# Patient Record
Sex: Female | Born: 1965 | Race: White | Hispanic: No | Marital: Married | Smoking: Never smoker
Health system: Southern US, Community
[De-identification: ages and names within clinical notes are randomized; demographics above are authoritative.]

## PROBLEM LIST (undated history)

## (undated) DIAGNOSIS — K635 Polyp of colon: Secondary | ICD-10-CM

## (undated) DIAGNOSIS — R61 Generalized hyperhidrosis: Secondary | ICD-10-CM

## (undated) DIAGNOSIS — E8941 Symptomatic postprocedural ovarian failure: Secondary | ICD-10-CM

## (undated) DIAGNOSIS — Z9889 Other specified postprocedural states: Secondary | ICD-10-CM

## (undated) DIAGNOSIS — K589 Irritable bowel syndrome without diarrhea: Secondary | ICD-10-CM

## (undated) DIAGNOSIS — I10 Essential (primary) hypertension: Secondary | ICD-10-CM

## (undated) DIAGNOSIS — F419 Anxiety disorder, unspecified: Secondary | ICD-10-CM

## (undated) DIAGNOSIS — A4902 Methicillin resistant Staphylococcus aureus infection, unspecified site: Secondary | ICD-10-CM

## (undated) DIAGNOSIS — K219 Gastro-esophageal reflux disease without esophagitis: Secondary | ICD-10-CM

## (undated) DIAGNOSIS — T8859XA Other complications of anesthesia, initial encounter: Secondary | ICD-10-CM

## (undated) DIAGNOSIS — R609 Edema, unspecified: Secondary | ICD-10-CM

## (undated) DIAGNOSIS — G4730 Sleep apnea, unspecified: Secondary | ICD-10-CM

## (undated) DIAGNOSIS — C439 Malignant melanoma of skin, unspecified: Secondary | ICD-10-CM

## (undated) DIAGNOSIS — T4145XA Adverse effect of unspecified anesthetic, initial encounter: Secondary | ICD-10-CM

## (undated) DIAGNOSIS — E079 Disorder of thyroid, unspecified: Secondary | ICD-10-CM

## (undated) DIAGNOSIS — R202 Paresthesia of skin: Secondary | ICD-10-CM

## (undated) DIAGNOSIS — D649 Anemia, unspecified: Secondary | ICD-10-CM

## (undated) DIAGNOSIS — I509 Heart failure, unspecified: Secondary | ICD-10-CM

## (undated) DIAGNOSIS — G709 Myoneural disorder, unspecified: Secondary | ICD-10-CM

## (undated) DIAGNOSIS — IMO0001 Reserved for inherently not codable concepts without codable children: Secondary | ICD-10-CM

## (undated) DIAGNOSIS — H269 Unspecified cataract: Secondary | ICD-10-CM

## (undated) DIAGNOSIS — E7800 Pure hypercholesterolemia, unspecified: Secondary | ICD-10-CM

## (undated) DIAGNOSIS — R112 Nausea with vomiting, unspecified: Secondary | ICD-10-CM

## (undated) DIAGNOSIS — R5383 Other fatigue: Secondary | ICD-10-CM

## (undated) DIAGNOSIS — G43909 Migraine, unspecified, not intractable, without status migrainosus: Secondary | ICD-10-CM

## (undated) DIAGNOSIS — S3210XA Unspecified fracture of sacrum, initial encounter for closed fracture: Secondary | ICD-10-CM

## (undated) DIAGNOSIS — G1221 Amyotrophic lateral sclerosis: Secondary | ICD-10-CM

## (undated) DIAGNOSIS — J349 Unspecified disorder of nose and nasal sinuses: Secondary | ICD-10-CM

## (undated) DIAGNOSIS — M1990 Unspecified osteoarthritis, unspecified site: Secondary | ICD-10-CM

## (undated) DIAGNOSIS — R946 Abnormal results of thyroid function studies: Secondary | ICD-10-CM

## (undated) DIAGNOSIS — E119 Type 2 diabetes mellitus without complications: Secondary | ICD-10-CM

## (undated) DIAGNOSIS — E538 Deficiency of other specified B group vitamins: Secondary | ICD-10-CM

## (undated) DIAGNOSIS — N63 Unspecified lump in unspecified breast: Secondary | ICD-10-CM

## (undated) DIAGNOSIS — T7840XA Allergy, unspecified, initial encounter: Secondary | ICD-10-CM

## (undated) HISTORY — DX: Morbid (severe) obesity due to excess calories: E66.01

## (undated) HISTORY — DX: Other fatigue: R53.83

## (undated) HISTORY — DX: Myoneural disorder, unspecified: G70.9

## (undated) HISTORY — DX: Unspecified disorder of nose and nasal sinuses: J34.9

## (undated) HISTORY — PX: SPINE SURGERY: SHX786

## (undated) HISTORY — DX: Methicillin resistant Staphylococcus aureus infection, unspecified site: A49.02

## (undated) HISTORY — DX: Symptomatic postprocedural ovarian failure: E89.41

## (undated) HISTORY — DX: Deficiency of other specified B group vitamins: E53.8

## (undated) HISTORY — DX: Unspecified osteoarthritis, unspecified site: M19.90

## (undated) HISTORY — DX: Abnormal results of thyroid function studies: R94.6

## (undated) HISTORY — DX: Edema, unspecified: R60.9

## (undated) HISTORY — DX: Sleep apnea, unspecified: G47.30

## (undated) HISTORY — DX: Allergy, unspecified, initial encounter: T78.40XA

## (undated) HISTORY — DX: Unspecified cataract: H26.9

## (undated) HISTORY — DX: Unspecified lump in unspecified breast: N63.0

## (undated) HISTORY — DX: Anxiety disorder, unspecified: F41.9

## (undated) HISTORY — DX: Irritable bowel syndrome, unspecified: K58.9

## (undated) HISTORY — DX: Generalized hyperhidrosis: R61

## (undated) HISTORY — DX: Malignant melanoma of skin, unspecified: C43.9

## (undated) HISTORY — DX: Anemia, unspecified: D64.9

## (undated) HISTORY — DX: Heart failure, unspecified: I50.9

## (undated) HISTORY — DX: Paresthesia of skin: R20.2

## (undated) HISTORY — DX: Reserved for inherently not codable concepts without codable children: IMO0001

## (undated) HISTORY — DX: Type 2 diabetes mellitus without complications: E11.9

## (undated) HISTORY — DX: Pure hypercholesterolemia, unspecified: E78.00

## (undated) HISTORY — PX: TUBAL LIGATION: SHX77

## (undated) HISTORY — DX: Essential (primary) hypertension: I10

## (undated) HISTORY — DX: Migraine, unspecified, not intractable, without status migrainosus: G43.909

## (undated) HISTORY — DX: Gastro-esophageal reflux disease without esophagitis: K21.9

## (undated) HISTORY — DX: Disorder of thyroid, unspecified: E07.9

## (undated) HISTORY — DX: Unspecified fracture of sacrum, initial encounter for closed fracture: S32.10XA

## (undated) HISTORY — DX: Polyp of colon: K63.5

---
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
## 1989-03-20 HISTORY — PX: CHOLECYSTECTOMY: SHX55

## 1995-03-21 HISTORY — PX: ABDOMINAL HYSTERECTOMY: SHX81

## 1995-03-21 HISTORY — PX: OTHER SURGICAL HISTORY: SHX169

## 1997-07-03 ENCOUNTER — Ambulatory Visit (HOSPITAL_COMMUNITY): Admission: RE | Admit: 1997-07-03 | Discharge: 1997-07-03 | Payer: Self-pay | Admitting: *Deleted

## 1997-09-04 ENCOUNTER — Ambulatory Visit (HOSPITAL_COMMUNITY): Admission: RE | Admit: 1997-09-04 | Discharge: 1997-09-04 | Payer: Self-pay | Admitting: *Deleted

## 1997-10-29 ENCOUNTER — Other Ambulatory Visit: Admission: RE | Admit: 1997-10-29 | Discharge: 1997-10-29 | Payer: Self-pay | Admitting: Obstetrics and Gynecology

## 1998-05-28 ENCOUNTER — Ambulatory Visit (HOSPITAL_COMMUNITY): Admission: RE | Admit: 1998-05-28 | Discharge: 1998-05-28 | Payer: Self-pay | Admitting: Gastroenterology

## 1999-11-10 ENCOUNTER — Ambulatory Visit (HOSPITAL_COMMUNITY): Admission: RE | Admit: 1999-11-10 | Discharge: 1999-11-10 | Payer: Self-pay | Admitting: Obstetrics and Gynecology

## 1999-11-10 ENCOUNTER — Encounter: Payer: Self-pay | Admitting: Obstetrics and Gynecology

## 1999-11-15 ENCOUNTER — Observation Stay (HOSPITAL_COMMUNITY): Admission: RE | Admit: 1999-11-15 | Discharge: 1999-11-16 | Payer: Self-pay | Admitting: Obstetrics and Gynecology

## 1999-11-15 ENCOUNTER — Encounter (INDEPENDENT_AMBULATORY_CARE_PROVIDER_SITE_OTHER): Payer: Self-pay

## 2000-10-30 ENCOUNTER — Other Ambulatory Visit: Admission: RE | Admit: 2000-10-30 | Discharge: 2000-10-30 | Payer: Self-pay | Admitting: Obstetrics and Gynecology

## 2001-05-30 ENCOUNTER — Ambulatory Visit (HOSPITAL_COMMUNITY): Admission: RE | Admit: 2001-05-30 | Discharge: 2001-05-30 | Payer: Self-pay | Admitting: Gastroenterology

## 2002-04-01 ENCOUNTER — Encounter: Payer: Self-pay | Admitting: Gastroenterology

## 2002-04-01 ENCOUNTER — Ambulatory Visit (HOSPITAL_COMMUNITY): Admission: RE | Admit: 2002-04-01 | Discharge: 2002-04-01 | Payer: Self-pay | Admitting: Gastroenterology

## 2002-09-16 ENCOUNTER — Ambulatory Visit (HOSPITAL_COMMUNITY): Admission: RE | Admit: 2002-09-16 | Discharge: 2002-09-16 | Payer: Self-pay | Admitting: Gastroenterology

## 2002-09-23 ENCOUNTER — Encounter: Payer: Self-pay | Admitting: Gastroenterology

## 2002-09-23 ENCOUNTER — Ambulatory Visit (HOSPITAL_COMMUNITY): Admission: RE | Admit: 2002-09-23 | Discharge: 2002-09-23 | Payer: Self-pay | Admitting: Gastroenterology

## 2002-11-20 ENCOUNTER — Ambulatory Visit (HOSPITAL_COMMUNITY): Admission: RE | Admit: 2002-11-20 | Discharge: 2002-11-20 | Payer: Self-pay | Admitting: Family Medicine

## 2002-11-20 ENCOUNTER — Encounter: Payer: Self-pay | Admitting: Family Medicine

## 2004-02-16 ENCOUNTER — Ambulatory Visit: Payer: Self-pay | Admitting: Internal Medicine

## 2004-02-19 ENCOUNTER — Other Ambulatory Visit: Admission: RE | Admit: 2004-02-19 | Discharge: 2004-02-19 | Payer: Self-pay | Admitting: Obstetrics and Gynecology

## 2004-03-20 HISTORY — PX: KNEE SURGERY: SHX244

## 2004-05-17 ENCOUNTER — Ambulatory Visit (HOSPITAL_COMMUNITY): Admission: RE | Admit: 2004-05-17 | Discharge: 2004-05-17 | Payer: Self-pay | Admitting: Chiropractic Medicine

## 2004-05-24 ENCOUNTER — Ambulatory Visit (HOSPITAL_COMMUNITY): Admission: RE | Admit: 2004-05-24 | Discharge: 2004-05-24 | Payer: Self-pay | Admitting: Chiropractic Medicine

## 2004-08-25 ENCOUNTER — Ambulatory Visit: Payer: Self-pay

## 2004-08-30 ENCOUNTER — Ambulatory Visit: Payer: Self-pay

## 2004-09-09 ENCOUNTER — Ambulatory Visit: Payer: Self-pay | Admitting: Family Medicine

## 2004-09-27 ENCOUNTER — Ambulatory Visit: Payer: Self-pay | Admitting: Family Medicine

## 2004-12-15 ENCOUNTER — Other Ambulatory Visit: Admission: RE | Admit: 2004-12-15 | Discharge: 2004-12-15 | Payer: Self-pay | Admitting: Obstetrics and Gynecology

## 2005-02-28 ENCOUNTER — Ambulatory Visit (HOSPITAL_COMMUNITY): Admission: RE | Admit: 2005-02-28 | Discharge: 2005-02-28 | Payer: Self-pay | Admitting: Gastroenterology

## 2007-06-27 ENCOUNTER — Ambulatory Visit: Payer: Self-pay | Admitting: Critical Care Medicine

## 2007-06-27 DIAGNOSIS — J309 Allergic rhinitis, unspecified: Secondary | ICD-10-CM | POA: Insufficient documentation

## 2007-06-27 DIAGNOSIS — I10 Essential (primary) hypertension: Secondary | ICD-10-CM | POA: Insufficient documentation

## 2007-06-27 DIAGNOSIS — K219 Gastro-esophageal reflux disease without esophagitis: Secondary | ICD-10-CM

## 2007-06-27 DIAGNOSIS — J45909 Unspecified asthma, uncomplicated: Secondary | ICD-10-CM | POA: Insufficient documentation

## 2007-06-30 DIAGNOSIS — E1140 Type 2 diabetes mellitus with diabetic neuropathy, unspecified: Secondary | ICD-10-CM | POA: Insufficient documentation

## 2007-06-30 DIAGNOSIS — Z794 Long term (current) use of insulin: Secondary | ICD-10-CM

## 2007-06-30 DIAGNOSIS — E785 Hyperlipidemia, unspecified: Secondary | ICD-10-CM

## 2007-06-30 DIAGNOSIS — R05 Cough: Secondary | ICD-10-CM

## 2007-06-30 DIAGNOSIS — E1165 Type 2 diabetes mellitus with hyperglycemia: Secondary | ICD-10-CM

## 2007-06-30 DIAGNOSIS — R059 Cough, unspecified: Secondary | ICD-10-CM | POA: Insufficient documentation

## 2007-07-03 ENCOUNTER — Telehealth: Payer: Self-pay | Admitting: Critical Care Medicine

## 2007-08-22 ENCOUNTER — Ambulatory Visit: Payer: Self-pay | Admitting: Critical Care Medicine

## 2008-05-05 ENCOUNTER — Ambulatory Visit: Payer: Self-pay | Admitting: Family Medicine

## 2008-12-23 ENCOUNTER — Ambulatory Visit: Payer: Self-pay | Admitting: Family Medicine

## 2009-02-01 ENCOUNTER — Ambulatory Visit: Payer: Self-pay | Admitting: Family Medicine

## 2009-02-24 ENCOUNTER — Ambulatory Visit: Payer: Self-pay | Admitting: Family Medicine

## 2009-03-25 ENCOUNTER — Encounter (INDEPENDENT_AMBULATORY_CARE_PROVIDER_SITE_OTHER): Payer: Self-pay | Admitting: Internal Medicine

## 2009-04-02 ENCOUNTER — Ambulatory Visit (HOSPITAL_COMMUNITY): Admission: RE | Admit: 2009-04-02 | Discharge: 2009-04-02 | Payer: Self-pay | Admitting: Surgery

## 2009-04-12 ENCOUNTER — Ambulatory Visit (HOSPITAL_COMMUNITY): Admission: RE | Admit: 2009-04-12 | Discharge: 2009-04-12 | Payer: Self-pay | Admitting: Surgery

## 2009-04-29 ENCOUNTER — Encounter: Admission: RE | Admit: 2009-04-29 | Discharge: 2009-07-28 | Payer: Self-pay | Admitting: Surgery

## 2009-05-11 ENCOUNTER — Ambulatory Visit: Payer: Self-pay | Admitting: Family Medicine

## 2009-05-18 HISTORY — PX: LAPAROSCOPIC GASTRIC BANDING: SHX1100

## 2009-06-08 ENCOUNTER — Ambulatory Visit (HOSPITAL_COMMUNITY)
Admission: RE | Admit: 2009-06-08 | Discharge: 2009-06-09 | Payer: Self-pay | Source: Home / Self Care | Admitting: Surgery

## 2009-09-13 ENCOUNTER — Ambulatory Visit: Payer: Self-pay | Admitting: Family Medicine

## 2009-10-04 ENCOUNTER — Encounter
Admission: RE | Admit: 2009-10-04 | Discharge: 2009-10-04 | Payer: Self-pay | Source: Home / Self Care | Admitting: Surgery

## 2010-04-21 NOTE — Letter (Signed)
Summary: Page One Only/[facility]  Page One Only/[facility]   Imported By: Lanelle Bal 04/17/2009 10:39:25  _____________________________________________________________________  External Attachment:    Type:   Image     Comment:   External Document

## 2010-05-12 ENCOUNTER — Ambulatory Visit: Payer: Self-pay | Admitting: Family Medicine

## 2010-05-24 ENCOUNTER — Ambulatory Visit: Payer: Self-pay | Admitting: Family Medicine

## 2010-06-10 LAB — DIFFERENTIAL
Basophils Absolute: 0 10*3/uL (ref 0.0–0.1)
Basophils Absolute: 0.1 10*3/uL (ref 0.0–0.1)
Basophils Relative: 0 % (ref 0–1)
Eosinophils Absolute: 0.1 10*3/uL (ref 0.0–0.7)
Eosinophils Relative: 2 % (ref 0–5)
Lymphs Abs: 2.2 10*3/uL (ref 0.7–4.0)
Lymphs Abs: 2.3 10*3/uL (ref 0.7–4.0)
Monocytes Absolute: 0.4 10*3/uL (ref 0.1–1.0)
Monocytes Relative: 7 % (ref 3–12)
Neutrophils Relative %: 50 % (ref 43–77)

## 2010-06-10 LAB — COMPREHENSIVE METABOLIC PANEL
ALT: 47 U/L — ABNORMAL HIGH (ref 0–35)
AST: 37 U/L (ref 0–37)
Albumin: 3.8 g/dL (ref 3.5–5.2)
Alkaline Phosphatase: 53 U/L (ref 39–117)
CO2: 29 mEq/L (ref 19–32)
Calcium: 9.3 mg/dL (ref 8.4–10.5)
Creatinine, Ser: 0.84 mg/dL (ref 0.4–1.2)
GFR calc non Af Amer: >60 mL/min (ref >60)
Sodium: 139 mEq/L (ref 135–145)
Total Bilirubin: 0.5 mg/dL (ref 0.3–1.2)
Total Protein: 7.1 g/dL (ref 6.0–8.3)

## 2010-06-10 LAB — CBC
HCT: 32.9 % — ABNORMAL LOW (ref 36.0–46.0)
HCT: 37.4 % (ref 36.0–46.0)
Hemoglobin: 12 g/dL (ref 12.0–15.0)
MCHC: 32.1 g/dL (ref 30.0–36.0)
RBC: 3.94 MIL/uL (ref 3.87–5.11)
RBC: 4.48 MIL/uL (ref 3.87–5.11)
WBC: 5.9 10*3/uL (ref 4.0–10.5)

## 2010-06-10 LAB — GLUCOSE, CAPILLARY
Glucose-Capillary: 110 mg/dL — ABNORMAL HIGH (ref 70–99)
Glucose-Capillary: 116 mg/dL — ABNORMAL HIGH (ref 70–99)
Glucose-Capillary: 125 mg/dL — ABNORMAL HIGH (ref 70–99)
Glucose-Capillary: 154 mg/dL — ABNORMAL HIGH (ref 70–99)
Glucose-Capillary: 97 mg/dL (ref 70–99)

## 2010-06-10 LAB — HEMOGLOBIN AND HEMATOCRIT, BLOOD
HCT: 32.2 % — ABNORMAL LOW (ref 36.0–46.0)
Hemoglobin: 10.6 g/dL — ABNORMAL LOW (ref 12.0–15.0)

## 2010-08-05 NOTE — Op Note (Signed)
[facility]  Patient:    [name], [name]                  MRN: [id number] Proc. Date: 11/15/99 Adm. Date:  [id number] Attending:  Miguel Aschoff                           Operative Report  PREOPERATIVE DIAGNOSES:  Persistent pelvic pain.  POSTOPERATIVE DIAGNOSES:  Pelvic adhesions.  PROCEDURE:  Diagnostic laparoscopy with laparoscopic bilateral salpingo-oophorectomy.  SURGEON:  Dr. Miguel Aschoff.  ASSISTANT:  Dr. Conley Simmonds.  ANESTHESIA:  General.  COMPLICATIONS:  None.  JUSTIFICATION:  The patient is a 45 year old white female who underwent hysterectomy in 1998. The patient has had persistent left lower quadrant pain unresponsive to medical therapy.  Ultrasound revealed a cyst on this ovary. This was aspirated under radiologic guidance and the pain persisted following the aspiration of the cyst. In view of these symptoms with no response to either medical treatment or basic radiologic attempts to correct the pain she presents now to undergo laparoscopy. The risks and benefits of this procedure were discussed with the patient including the possibility of laparotomy.  DESCRIPTION OF PROCEDURE:  The patient was taken to the operating room, placed in the supine position. General anesthesia was administered without difficulty. She was then placed in dorsal lithotomy position, prepped and draped in the usual sterile fashion. The bladder was catheterized, Foley catheter left in place. After this was done, a small infraumbilical incision was made. A Veress needle was inserted and then the abdomen was insufflated with 3 liters of CO2. Following the insufflation, the trocar and the laparoscope was placed followed by the laparoscope itself. This allowed complete visualization. A 5 mm midline port was established and an 11 mm left lower quadrant port was established under direct vision. The specimen revealed adhesions of the appendices epiploica of the  sigmoid colon to the left lateral pelvic side wall deep in the pelvis. In addition, the left ovary was adherent to the left pelvic side wall. The ovary other than having these adhesions appeared grossly within normal limits. The right ovary was inspected and appeared to be within normal limits. There were minimal adhesions noted on the right. Intestinal surfaces were unremarkable, the liver surface was visualized and appeared to be within normal limits. At this point, the left ovary was elevated and the adhesions holding the ovary to the pelvic side wall were taken down sharply with care to avoid injury to any adjacent structures. It was possible to free the ovary completely from these adhesions and elevate it. The ovary was only bound by the infundibulopelvic ligament. Then using 2 Vicryl 0 endoloops it was possible to ligate the infundibulopelvic ligament doubly and then cut the ovary and tube specimen free. It was then placed in the cul-de-sac. Attention was then directed to the right ovary. In view of this patients multiple complaints of pelvic pain, prior surgery and complications with methicillin resistant staph, it was elected to proceed with bilateral salpingo-oophorectomy and try to avoid any future pelvic surgery. The identical procedure was then carried out on the right side with the ovary being elevated where it was ______ by the infundibulopelvic ligament and with 2-0 Vicryl endoloops this ligament was ligated and then the ovary and tube cut free. An endocatch was then placed into the abdomen and both ovaries were placed in the endocatch bag. This with then brought out through the abdominal wall.  The left upper quadrant vascular incision was extended by approximately 3 or 4 mm and then there was a possibility to bring the specimen through the abdominal wall. At this point, the pelvis irrigated with warm saline. There appeared to be excellent hemostasis noted. At this point, the  CO2 was allowed to escape and all instruments were removed. The fascia of the left lower quadrant port was then identified and ligated using running interlocking #o Vicryl suture. The subcutaneous tissue was closed using interrupted #0 Vicryl suture. The infraumbilical port was then closed using interrupted #o Vicryl sutures and then using subcuticular 4-0 Vicryl suture. The 5 mm port was closed using subcuticular 4-0 Vicryl suture. At this point with no other abnormalities being noted, the port sites were injected with 0.25% Marcaine. The procedure was completed. The patient is going to spend the night in the hospital for observation. She should be able to be discharged home on November 16, 1999. DD:  11/15/99 TD:  11/15/99 Job: [id number]

## 2010-09-20 ENCOUNTER — Other Ambulatory Visit: Payer: Self-pay | Admitting: Family Medicine

## 2010-09-20 DIAGNOSIS — G43909 Migraine, unspecified, not intractable, without status migrainosus: Secondary | ICD-10-CM

## 2010-09-23 ENCOUNTER — Other Ambulatory Visit: Payer: Self-pay

## 2010-10-06 ENCOUNTER — Encounter (INDEPENDENT_AMBULATORY_CARE_PROVIDER_SITE_OTHER): Payer: Self-pay | Admitting: Surgery

## 2010-11-02 ENCOUNTER — Encounter (INDEPENDENT_AMBULATORY_CARE_PROVIDER_SITE_OTHER): Payer: Self-pay | Admitting: General Surgery

## 2010-11-04 ENCOUNTER — Encounter (INDEPENDENT_AMBULATORY_CARE_PROVIDER_SITE_OTHER): Payer: Self-pay | Admitting: Surgery

## 2010-11-04 ENCOUNTER — Ambulatory Visit (INDEPENDENT_AMBULATORY_CARE_PROVIDER_SITE_OTHER): Payer: BC Managed Care – PPO | Admitting: Surgery

## 2010-11-04 VITALS — BP 128/90 | Ht 67.0 in | Wt 263.0 lb

## 2010-11-04 DIAGNOSIS — Z9884 Bariatric surgery status: Secondary | ICD-10-CM

## 2010-11-04 DIAGNOSIS — Z4651 Encounter for fitting and adjustment of gastric lap band: Secondary | ICD-10-CM

## 2010-11-04 NOTE — Progress Notes (Signed)
[name] comes in today. She is 17 months postop and has lost 91 pounds. Today's weight is 263. She felt that she needed to fill. I added 0.3 cc to her band.  We will see her back at the end of December. She has some issue going on her right breast and will need to be followed up with mammography. In addition she has some questionable thyroid issues going on as well. [name] was working as a period plan return in December.

## 2010-11-16 ENCOUNTER — Encounter (INDEPENDENT_AMBULATORY_CARE_PROVIDER_SITE_OTHER): Payer: Self-pay | Admitting: General Surgery

## 2010-11-16 ENCOUNTER — Ambulatory Visit (INDEPENDENT_AMBULATORY_CARE_PROVIDER_SITE_OTHER): Payer: BC Managed Care – PPO | Admitting: General Surgery

## 2010-11-16 NOTE — Progress Notes (Signed)
Patient returns after having had a fill about 10 days ago. She has been unable to tolerate any solid food with frequent nausea and vomiting. She is having nighttime reflux and regurgitation which she was not having prior to the fill.  With this information we went ahead and removed the 0.3 cc that was added at her last visit. She will followup in approximately 3 months.

## 2010-11-16 NOTE — Patient Instructions (Signed)
Call for concerns 

## 2010-11-30 ENCOUNTER — Ambulatory Visit: Payer: Self-pay | Admitting: Family Medicine

## 2011-03-21 HISTORY — PX: COLONOSCOPY: SHX174

## 2011-03-21 LAB — HM COLONOSCOPY: HM Colonoscopy: NORMAL

## 2011-03-31 ENCOUNTER — Encounter (INDEPENDENT_AMBULATORY_CARE_PROVIDER_SITE_OTHER): Payer: Self-pay | Admitting: Surgery

## 2011-03-31 ENCOUNTER — Ambulatory Visit (INDEPENDENT_AMBULATORY_CARE_PROVIDER_SITE_OTHER): Payer: BC Managed Care – PPO | Admitting: Surgery

## 2011-03-31 VITALS — BP 132/88 | HR 72 | Temp 97.0°F | Resp 16 | Ht 67.0 in | Wt 250.2 lb

## 2011-03-31 DIAGNOSIS — Z9884 Bariatric surgery status: Secondary | ICD-10-CM

## 2011-03-31 DIAGNOSIS — Z4651 Encounter for fitting and adjustment of gastric lap band: Secondary | ICD-10-CM

## 2011-03-31 NOTE — Progress Notes (Signed)
[name] Body mass index is 39.19 kg/(m^2).  Having regurgitation:  Yes and esophageal spasm with first bite   Nocturnal reflux?  yes  Amount of fill  -0.3  [name] has been eating more suits. She reports pain with first bite of solids which I think is esophageal spasm. I think she still tied so it took out 0.3 cc per minute. Return 2 months

## 2011-03-31 NOTE — Patient Instructions (Signed)

## 2011-05-18 ENCOUNTER — Ambulatory Visit: Payer: Self-pay | Admitting: Family Medicine

## 2011-06-02 ENCOUNTER — Encounter (INDEPENDENT_AMBULATORY_CARE_PROVIDER_SITE_OTHER): Payer: BC Managed Care – PPO | Admitting: Surgery

## 2011-08-03 ENCOUNTER — Encounter (INDEPENDENT_AMBULATORY_CARE_PROVIDER_SITE_OTHER): Payer: BC Managed Care – PPO | Admitting: Surgery

## 2011-08-28 LAB — HM PAP SMEAR

## 2011-09-28 ENCOUNTER — Encounter (INDEPENDENT_AMBULATORY_CARE_PROVIDER_SITE_OTHER): Payer: Self-pay | Admitting: Surgery

## 2011-09-28 ENCOUNTER — Ambulatory Visit (INDEPENDENT_AMBULATORY_CARE_PROVIDER_SITE_OTHER): Payer: BC Managed Care – PPO | Admitting: Surgery

## 2011-09-28 VITALS — Temp 96.6°F | Ht 67.0 in | Wt 252.5 lb

## 2011-09-28 DIAGNOSIS — Z9884 Bariatric surgery status: Secondary | ICD-10-CM

## 2011-09-28 NOTE — Patient Instructions (Signed)

## 2011-09-28 NOTE — Progress Notes (Signed)
[name] Body mass index is 39.55 kg/(m^2).  Having regurgitation:  no  Nocturnal reflux?  no  Amount of fill  0.25  Needing restriction.  Weight 252.

## 2011-10-10 ENCOUNTER — Encounter (INDEPENDENT_AMBULATORY_CARE_PROVIDER_SITE_OTHER): Payer: Self-pay | Admitting: Surgery

## 2011-10-10 ENCOUNTER — Ambulatory Visit (INDEPENDENT_AMBULATORY_CARE_PROVIDER_SITE_OTHER): Payer: BC Managed Care – PPO | Admitting: Surgery

## 2011-10-10 VITALS — BP 142/92 | HR 64 | Temp 97.0°F | Resp 18 | Ht 67.0 in | Wt 251.0 lb

## 2011-10-10 DIAGNOSIS — Z9884 Bariatric surgery status: Secondary | ICD-10-CM

## 2011-10-10 NOTE — Progress Notes (Signed)
URGENT Office [facility] P Lamos 46 y.o.  Body mass index is 39.31 kg/(m^2).  Patient Active Problem List  Diagnosis  . DIABETES, TYPE 2  . HYPERLIPIDEMIA  . HYPERTENSION  . ALLERGIC RHINITIS  . ASTHMA  . GERD  . COUGH  . Lapband APL with HH repair    Allergies  Allergen Reactions  . Levofloxacin   . Meperidine Hcl   . Oxycodone-Acetaminophen   . Oxycodone-Acetaminophen   . Penicillins   . Vancomycin     Past Surgical History  Procedure Date  . Cholecystectomy 03/1989  . Abdominal hysterectomy 1997    complete in 1997, partial was in 1995  . Knee surgery 2006  . Laparoscopic gastric banding 05/2009  . Cesarean section 1987  and 1989  . Abcess removal 1997   SMITH,KRISTI, MD No diagnosis found.  Too tight with nocturnal reflux.  0.2 withdrawn from her band.  Will see at previously made appt.  Otherwise will see sooner if not better.  Matt B. Daphine Deutscher, MD, [facility] [phone] beeper [phone]  10/10/2011 3:28 PM

## 2011-10-10 NOTE — Patient Instructions (Addendum)

## 2011-10-16 ENCOUNTER — Ambulatory Visit (INDEPENDENT_AMBULATORY_CARE_PROVIDER_SITE_OTHER): Payer: BC Managed Care – PPO | Admitting: Family Medicine

## 2011-10-16 VITALS — BP 114/70 | HR 92 | Temp 98.2°F | Resp 16 | Ht 67.0 in | Wt 249.0 lb

## 2011-10-16 DIAGNOSIS — D239 Other benign neoplasm of skin, unspecified: Secondary | ICD-10-CM

## 2011-10-16 DIAGNOSIS — H6090 Unspecified otitis externa, unspecified ear: Secondary | ICD-10-CM

## 2011-10-16 DIAGNOSIS — H60399 Other infective otitis externa, unspecified ear: Secondary | ICD-10-CM

## 2011-10-16 DIAGNOSIS — D229 Melanocytic nevi, unspecified: Secondary | ICD-10-CM

## 2011-10-16 MED ORDER — NEOMYCIN-POLYMYXIN-HC 1 % OT SOLN: 4.0000 [drp] | Freq: Four times a day (QID) | OTIC | Status: DC

## 2011-10-16 NOTE — Progress Notes (Signed)
[facility] [address] [place] [address] [phone]  Date:  10/16/2011   Name:  [name]   DOB:  01/18/1966   MRN:  [id number]  PCP:  Nilda Simmer, MD    Chief Complaint: Otalgia, Sore Throat and Nevus   History of Present Illness:  [name] is a 46 y.o. very pleasant female patient who presents with the following:  She has noted left ear pain and a sore throat for a couple of days.  The right ear feels full but not painful.  She has not been swimming but does tend to get water in her ears when she showers.  The left ear hurts to put pressure on/ hurts to lie on the ear.   No fever.  She had noted a cough but this was due GERD- better since lap- band loostened up.    She also has a mold on the right side of her neck.  It has been present "forever" but has bled and scabbed for the last couple of weeks only. She is concerned about this mole and wants to be sure if it not anything of concern.  No history of skin cancer    Patient Active Problem List  Diagnosis  . DIABETES, TYPE 2  . HYPERLIPIDEMIA  . HYPERTENSION  . ALLERGIC RHINITIS  . ASTHMA  . GERD  . COUGH  . Lapband APL with HH repair    Past Medical History  Diagnosis Date  . Diabetes mellitus   . Hypertension   . Anemia   . Migraines   . Thyroid disease   . Colon polyp   . IBS (irritable bowel syndrome)   . Fatigue   . Night sweats   . Lump in female breast   . Asthma   . Sinus problem   . Reflux   . Family history of breast cancer     two aunts  . GERD (gastroesophageal reflux disease)     Past Surgical History  Procedure Date  . Cholecystectomy 03/1989  . Abdominal hysterectomy 1997    complete in 1997, partial was in 1995  . Knee surgery 2006  . Laparoscopic gastric banding 05/2009  . Cesarean section 1987  and 1989  . Abcess removal 1997    History  Substance Use Topics  . Smoking status: Never Smoker   . Smokeless tobacco: Not on file  . Alcohol  Use: Yes     rarely - once per year    Family History  Problem Relation Age of Onset  . Diabetes Mother   . Heart disease Mother   . Other Mother     muscle disease  . Diabetes Father   . Heart disease Father   . Stroke Father   . Diabetes Brother   . Heart disease Brother     Allergies  Allergen Reactions  . Levofloxacin   . Meperidine Hcl   . Oxycodone-Acetaminophen   . Oxycodone-Acetaminophen   . Penicillins   . Vancomycin   . Amoxicillin Rash  . Tequin (Gatifloxacin) Rash    Medication list has been reviewed and updated.  Current Outpatient Prescriptions on File Prior to Visit  Medication Sig Dispense Refill  . ALPRAZolam (XANAX) 0.5 MG tablet Ad lib.      [name] atorvastatin (LIPITOR) 20 MG tablet Take 20 mg by mouth daily.        [name] CALCIUM PO Take by mouth 3 (three) times daily.        [name]  Cetirizine HCl (ZYRTEC PO) Take by mouth daily.        [name] Fexofenadine HCl (ALLEGRA PO) Take by mouth.        Mallie Darting HCl (METAGLIP PO) Take by mouth 2 (two) times daily. Patient taking 1000/2.5       . hydrochlorothiazide 25 MG tablet Take 25 mg by mouth daily.        [name] losartan (COZAAR) 100 MG tablet Take 100 mg by mouth daily.        . Multiple Vitamin (MULTIVITAMIN PO) Take by mouth daily.          Review of Systems:  As per HPI- otherwise negative.   Physical Examination: Filed Vitals:   10/16/11 0750  BP: 114/70  Pulse: 92  Temp: 98.2 F (36.8 C)  Resp: 16   Filed Vitals:   10/16/11 0750  Height: 5\' 7"  (1.702 m)  Weight: 249 lb (112.946 kg)   Body mass index is 39.00 kg/(m^2). Ideal Body Weight: Weight in (lb) to have BMI = 25: 159.3   GEN: WDWN, NAD, Non-toxic, A & O x 3, obese- but has lost over 150 lbs since her lap band! HEENT: Atraumatic, Normocephalic. Neck supple. No masses, No LAD.  Bilateral TM show scarring from prior OM.  Right IAC wnl. Left IAC appears normal, but she has tenderness with manipulation of pinna and exam of ear canal.   Oropharynx wnl Ears and Nose: No external deformity. There is an irritated, flesh colored papule on her right posterior neck.   CV: RRR, No M/G/R. No JVD. No thrill. No extra heart sounds. PULM: CTA B, no wheezes, crackles, rhonchi. No retractions. No resp. distress. No accessory muscle use. EXTR: No c/c/e NEURO Normal gait.  PSYCH: Normally interactive. Conversant. Not depressed or anxious appearing.  Calm demeanor.   VC obtained.  Prepped neck lesion with betadine and alcohol, anesthesia with 1ml of 1%lidocaine with epi.  Sterile technique employed to obtain a 4mm punch biopsy.  Placed one HM suture to close wound with ethilon suture, 4.0.  Specimen to path, wound dressed with band- aid  Assessment and Plan: 1. Mole of skin  Dermatology pathology  2. Otitis externa  NEOMYCIN-POLYMYXIN-HC, OTIC, (CORTISPORIN) 1 % SOLN   Suspect OE.  Cortisporin drops, let me know if not better in 2 days- Sooner if worse.   Mole on neck: suspect is benign, but will send to pathology to be sure.  SR in about 7 days.    Abbe Amsterdam, MD

## 2011-10-19 ENCOUNTER — Encounter: Payer: Self-pay | Admitting: Family Medicine

## 2011-10-22 ENCOUNTER — Ambulatory Visit (INDEPENDENT_AMBULATORY_CARE_PROVIDER_SITE_OTHER): Payer: BC Managed Care – PPO | Admitting: Physician Assistant

## 2011-10-22 VITALS — BP 130/80 | HR 76 | Temp 98.0°F | Resp 16

## 2011-10-22 DIAGNOSIS — Z4802 Encounter for removal of sutures: Secondary | ICD-10-CM

## 2011-10-22 DIAGNOSIS — S1190XA Unspecified open wound of unspecified part of neck, initial encounter: Secondary | ICD-10-CM

## 2011-10-22 NOTE — Progress Notes (Signed)
  Subjective:    Patient ID: [name], female    DOB: 1966-01-29, 46 y.o.   MRN: [id number]  HPI  Pt presents to clinic for suture removal.  The area is irritated and she is ready for the stitches to be removed.  Review of Systems     Objective:   Physical Exam  Well healed area - #1 stitch removed without difficulty.      Assessment & Plan:  Wound neck - wound care d/w pt.

## 2011-11-01 ENCOUNTER — Encounter (INDEPENDENT_AMBULATORY_CARE_PROVIDER_SITE_OTHER): Payer: Self-pay | Admitting: Surgery

## 2011-11-01 ENCOUNTER — Ambulatory Visit (INDEPENDENT_AMBULATORY_CARE_PROVIDER_SITE_OTHER): Payer: BC Managed Care – PPO | Admitting: Surgery

## 2011-11-01 VITALS — BP 110/78 | HR 60 | Temp 97.0°F | Resp 12 | Ht 67.0 in | Wt 244.0 lb

## 2011-11-01 DIAGNOSIS — R1310 Dysphagia, unspecified: Secondary | ICD-10-CM

## 2011-11-01 NOTE — Progress Notes (Signed)
[name] Body mass index is 38.22 kg/(m^2).  Having regurgitation:  Yes for solids  Nocturnal reflux?  no  Amount of fill  -0.3   Removed more.  If still dysphagia will get UGI

## 2011-11-01 NOTE — Patient Instructions (Addendum)

## 2011-12-04 ENCOUNTER — Ambulatory Visit: Payer: Self-pay | Admitting: Family Medicine

## 2011-12-11 ENCOUNTER — Ambulatory Visit: Payer: BC Managed Care – PPO | Admitting: Family Medicine

## 2011-12-18 ENCOUNTER — Ambulatory Visit (INDEPENDENT_AMBULATORY_CARE_PROVIDER_SITE_OTHER): Payer: BC Managed Care – PPO | Admitting: Family Medicine

## 2011-12-18 ENCOUNTER — Encounter: Payer: Self-pay | Admitting: Family Medicine

## 2011-12-18 ENCOUNTER — Ambulatory Visit: Payer: BC Managed Care – PPO

## 2011-12-18 VITALS — BP 138/90 | HR 78 | Temp 98.0°F | Resp 16 | Ht 65.75 in | Wt 245.8 lb

## 2011-12-18 DIAGNOSIS — J209 Acute bronchitis, unspecified: Secondary | ICD-10-CM

## 2011-12-18 DIAGNOSIS — M25519 Pain in unspecified shoulder: Secondary | ICD-10-CM

## 2011-12-18 DIAGNOSIS — J309 Allergic rhinitis, unspecified: Secondary | ICD-10-CM

## 2011-12-18 DIAGNOSIS — E7800 Pure hypercholesterolemia, unspecified: Secondary | ICD-10-CM

## 2011-12-18 DIAGNOSIS — M25512 Pain in left shoulder: Secondary | ICD-10-CM

## 2011-12-18 DIAGNOSIS — J45909 Unspecified asthma, uncomplicated: Secondary | ICD-10-CM

## 2011-12-18 DIAGNOSIS — Z23 Encounter for immunization: Secondary | ICD-10-CM

## 2011-12-18 DIAGNOSIS — F419 Anxiety disorder, unspecified: Secondary | ICD-10-CM

## 2011-12-18 DIAGNOSIS — E119 Type 2 diabetes mellitus without complications: Secondary | ICD-10-CM

## 2011-12-18 DIAGNOSIS — I10 Essential (primary) hypertension: Secondary | ICD-10-CM

## 2011-12-18 MED ORDER — ALPRAZOLAM 0.5 MG PO TABS: 0.5000 mg | ORAL_TABLET | Freq: Every day | ORAL | Status: DC

## 2011-12-18 MED ORDER — CLARITHROMYCIN 500 MG PO TABS: 500.0000 mg | ORAL_TABLET | Freq: Two times a day (BID) | ORAL | Status: DC

## 2011-12-18 NOTE — Progress Notes (Signed)
[address]   [place], [place]  [address]   [phone]  Subjective:    Patient ID: [name], female    DOB: 05/26/1965, 46 y.o.   MRN: [id number]  HPI This 46 y.o. female presents to establish care and for evaluation of the following:  1.  Cold: onset of illness three weeks ago.  Daughter on her back; baby has been sick.  Got sick from daughter, granddaughter.  No fever/chills/sweats.  +nasal congestion; +rhinorrhea; +productive cough; +voice changes; +PND; +ear pain; +intermittent ST.  No SOB; some wheezing for past few days.  No v/d.  Taking OTC Dayquil, Nyquil, inhalers, Robitussin cough syrup.  Now mostly in chest.  No recent antibiotics.  Taking all allergy medication -Flonase, nasal saline.    2.  DMII:  Three month follow-up.  No changes to management made at last visit.  Compliance with medication; good tolerance of medication; good symptom control.  Not checking sugars.  3. HTN; three month follow-up.  No changes to management made at last visit.  Reports good compliance with treatment, good tolerance to treatment; good symptom control.  Denies CP/palp/sob/leg swelling.   4.  Hyperlipidemia: non-fasting today. Three month follow-up; no changes to management made at last visit.  Poor compliance with treatment; good tolerance to treatment; good symptom control.  5.  S/p flu vaccine in office today.  6.  L shoulder pain:  Onset of several months ago.  No injury.  Mainly hurts at nighttime; has progressed to intermittent pain during the day. Taking Advil for pain.  Feels knot in posterior aspect of shoulder.  No neck pain but chronic neck stiffness.  +weaknesss in arm.  Lifting baby makes worse.  Laying no shoulder makes pain worse.  Taking Advil daily at night.    7.  Obesity: continues to lose weight since last visit.  8. Anxiety: worsening due to sick daughter; patient has been caring for granddaughter; daughter's husband currently deployed.  Daughter and grandchild have  been sick.     Review of Systems  Constitutional: Negative for fever, chills, diaphoresis and fatigue.  HENT: Positive for ear pain, congestion, rhinorrhea, sneezing, postnasal drip and sinus pressure. Negative for sore throat and mouth sores.   Respiratory: Positive for cough and wheezing. Negative for shortness of breath.   Gastrointestinal: Negative for nausea, vomiting, abdominal pain, diarrhea and blood in stool.  Musculoskeletal: Positive for myalgias and arthralgias. Negative for joint swelling.  Skin: Negative for pallor and rash.  Neurological: Negative for speech difficulty, weakness and numbness.    Past Medical History  Diagnosis Date  . Diabetes mellitus   . Hypertension   . Anemia   . Migraines   . Thyroid disease   . Colon polyp   . IBS (irritable bowel syndrome)   . Fatigue   . Night sweats   . Lump in female breast   . Asthma   . Sinus problem   . Reflux   . Family history of breast cancer     two aunts  . GERD (gastroesophageal reflux disease)     Past Surgical History  Procedure Date  . Cholecystectomy 03/1989  . Abdominal hysterectomy 1997    complete in 1997, partial was in 1995  . Knee surgery 2006  . Laparoscopic gastric banding 05/2009  . Cesarean section 1987  and 1989  . Abcess removal 1997    Prior to Admission medications   Medication Sig Start Date End Date Taking? Authorizing Provider  ALPRAZolam (  XANAX) 0.5 MG tablet Take 1 tablet (0.5 mg total) by mouth daily. 12/18/11  Yes Ethelda Chick, MD  atorvastatin (LIPITOR) 20 MG tablet Take 20 mg by mouth daily.     Yes Historical Provider, MD  CALCIUM PO Take by mouth 3 (three) times daily.     Yes Historical Provider, MD  Cetirizine HCl (ZYRTEC PO) Take by mouth daily.     Yes Historical Provider, MD  Fexofenadine HCl (ALLEGRA PO) Take by mouth.     Yes Historical Provider, MD  GlipiZIDE-MetFORMIN HCl (METAGLIP PO) Take by mouth 2 (two) times daily. Patient taking 1000/2.5    Yes Historical  Provider, MD  hydrochlorothiazide 25 MG tablet Take 25 mg by mouth daily.     Yes Historical Provider, MD  losartan (COZAAR) 100 MG tablet Take 100 mg by mouth daily.     Yes Historical Provider, MD  Multiple Vitamin (MULTIVITAMIN PO) Take by mouth daily.     Yes Historical Provider, MD  clarithromycin (BIAXIN) 500 MG tablet Take 1 tablet (500 mg total) by mouth 2 (two) times daily. 12/18/11   Ethelda Chick, MD  NEOMYCIN-POLYMYXIN-HC, OTIC, (CORTISPORIN) 1 % SOLN Place 4 drops into the left ear every 6 (six) hours. 10/16/11   Pearline Cables, MD    Allergies  Allergen Reactions  . Levofloxacin   . Meperidine Hcl   . Oxycodone-Acetaminophen   . Oxycodone-Acetaminophen   . Penicillins   . Vancomycin   . Amoxicillin Rash  . Tequin (Gatifloxacin) Rash    History   Social History  . Marital Status: Married    Spouse Name: N/A    Number of Children: N/A  . Years of Education: N/A   Occupational History  . Not on file.   Social History Main Topics  . Smoking status: Never Smoker   . Smokeless tobacco: Not on file  . Alcohol Use: Yes     rarely - once per year  . Drug Use: No  . Sexually Active:    Other Topics Concern  . Not on file   Social History Narrative  . No narrative on file    Family History  Problem Relation Age of Onset  . Diabetes Mother   . Heart disease Mother   . Other Mother     muscle disease  . Diabetes Father   . Heart disease Father   . Stroke Father   . Diabetes Brother   . Heart disease Brother         Objective:   Physical Exam  Nursing note and vitals reviewed. Constitutional: She is oriented to person, place, and time. She appears well-developed and well-nourished. No distress.  HENT:  Head: Normocephalic and atraumatic.  Right Ear: External ear normal.  Left Ear: External ear normal.  Nose: Nose normal.  Mouth/Throat: Oropharynx is clear and moist.  Eyes: Conjunctivae normal and EOM are normal. Pupils are equal, round, and  reactive to light.  Neck: Normal range of motion. Neck supple. No thyromegaly present.  Cardiovascular: Normal rate, regular rhythm, normal heart sounds and intact distal pulses.  Exam reveals no gallop and no friction rub.   No murmur heard. Pulmonary/Chest: Effort normal and breath sounds normal. No respiratory distress. She has no wheezes. She has no rales. She exhibits no tenderness.  Musculoskeletal:       Left shoulder: She exhibits tenderness and pain. She exhibits normal range of motion, no bony tenderness, no swelling, no effusion, no crepitus, no deformity, no  spasm, normal pulse and normal strength.       +TTP posterior L shoulder; full ROM shoulder; empty can sign negative; cross over negative.  Motor 5/5.  Lymphadenopathy:    She has no cervical adenopathy.  Neurological: She is alert and oriented to person, place, and time. No cranial nerve deficit. She exhibits normal muscle tone.  Skin: Skin is warm and dry. No rash noted. She is not diaphoretic.  Psychiatric: She has a normal mood and affect. Her behavior is normal. Judgment and thought content normal.     INFLUENZA VACCINE ADMINISTERED.    [facility] reading (PRIMARY) by  [name].  L shoulder: NAD   Assessment & Plan:   1. Type II or unspecified type diabetes mellitus without mention of complication, not stated as uncontrolled  Hemoglobin A1c  2. Pure hypercholesterolemia  Lipid panel  3. Unspecified hypothyroidism    4. Essential hypertension, benign  CBC with Differential, CK, Comprehensive metabolic panel  5. Need for prophylactic vaccination and inoculation against influenza  Flu vaccine greater than or equal to 3yo preservative free IM  6. Acute bronchitis  clarithromycin (BIAXIN) 500 MG tablet  7. Anxiety  ALPRAZolam (XANAX) 0.5 MG tablet  8. Shoulder pain, left  DG Shoulder Left     1.  DMII: moderately controlled; no change in medications; obtain labs. 2.  Hyperlipidemia: uncontrolled due to poor compliance  with medication; obtain labs.  No change to management at this time. 3. Hypertension: moderately controlled but acutely ill; no changes to management today; obtain labs. 4.  Acute bronchitis: New.  Rx for Biaxin provided; to hold statin while taking medication; continue allergy medications. 5.  L shoulder pain/strain: New. Rx for Naproxen bid provided; home exercises provided to perform daily.  Avoid repetitive lifting with arm.  6.  Anxiety: persistent; refill of Xanax provided. 7. S/p influenza vaccine.

## 2011-12-18 NOTE — Patient Instructions (Signed)
1. Type II or unspecified type diabetes mellitus without mention of complication, not stated as uncontrolled  CBC with Differential, Comprehensive metabolic panel, POCT glycosylated hemoglobin (Hb A1C)  2. Pure hypercholesterolemia  CBC with Differential, Comprehensive metabolic panel, Lipid panel  3. Unspecified hypothyroidism  CBC with Differential, Comprehensive metabolic panel, Lipid panel  4. Essential hypertension, benign  CBC with Differential, Comprehensive metabolic panel, Lipid panel  5. Need for prophylactic vaccination and inoculation against influenza  Flu vaccine greater than or equal to 3yo preservative free IM  6. Acute bronchitis  clarithromycin (BIAXIN) 500 MG tablet  7. Anxiety  ALPRAZolam (XANAX) 0.5 MG tablet   Shoulder Exercises EXERCISES  RANGE OF MOTION (ROM) AND STRETCHING EXERCISES These exercises may help you when beginning to rehabilitate your injury. Your symptoms may resolve with or without further involvement from your physician, physical therapist or athletic trainer. While completing these exercises, remember:   Restoring tissue flexibility helps normal motion to return to the joints. This allows healthier, less painful movement and activity.   An effective stretch should be held for at least 30 seconds.   A stretch should never be painful. You should only feel a gentle lengthening or release in the stretched tissue.  ROM - Pendulum  Bend at the waist so that your right / left arm falls away from your body. Support yourself with your opposite hand on a solid surface, such as a table or a countertop.   Your right / left arm should be perpendicular to the ground. If it is not perpendicular, you need to lean over farther. Relax the muscles in your right / left arm and shoulder as much as possible.   Gently sway your hips and trunk so they move your right / left arm without any use of your right / left shoulder muscles.   Progress your movements so that your  right / left arm moves side to side, then forward and backward, and finally, both clockwise and counterclockwise.   Complete __________ repetitions in each direction. Many people use this exercise to relieve discomfort in their shoulder as well as to gain range of motion.  Repeat __________ times. Complete this exercise __________ times per day. STRETCH - Flexion, Standing  Stand with good posture. With an underhand grip on your right / left hand and an overhand grip on the opposite hand, grasp a broomstick or cane so that your hands are a little more than shoulder-width apart.   Keeping your right / left elbow straight and shoulder muscles relaxed, push the stick with your opposite hand to raise your right / left arm in front of your body and then overhead. Raise your arm until you feel a stretch in your right / left shoulder, but before you have increased shoulder pain.   Try to avoid shrugging your right / left shoulder as your arm rises by keeping your shoulder blade tucked down and toward your mid-back spine. Hold __________ seconds.   Slowly return to the starting position.  Repeat __________ times. Complete this exercise __________ times per day. STRETCH - Internal Rotation  Place your right / left hand behind your back, palm-up.   Throw a towel or belt over your opposite shoulder. Grasp the towel/belt with your right / left hand.   While keeping an upright posture, gently pull up on the towel/belt until you feel a stretch in the front of your right / left shoulder.   Avoid shrugging your right / left shoulder as your arm  rises by keeping your shoulder blade tucked down and toward your mid-back spine.   Hold __________. Release the stretch by lowering your opposite hand.  Repeat __________ times. Complete this exercise __________ times per day. STRETCH - External Rotation and Abduction  Stagger your stance through a doorframe. It does not matter which foot is forward.   As  instructed by your physician, physical therapist or athletic trainer, place your hands:   And forearms above your head and on the door frame.   And forearms at head-height and on the door frame.   At elbow-height and on the door frame.   Keeping your head and chest upright and your stomach muscles tight to prevent over-extending your low-back, slowly shift your weight onto your front foot until you feel a stretch across your chest and/or in the front of your shoulders.   Hold __________ seconds. Shift your weight to your back foot to release the stretch.  Repeat __________ times. Complete this stretch __________ times per day.  STRENGTHENING EXERCISES  These exercises may help you when beginning to rehabilitate your injury. They may resolve your symptoms with or without further involvement from your physician, physical therapist or athletic trainer. While completing these exercises, remember:   Muscles can gain both the endurance and the strength needed for everyday activities through controlled exercises.   Complete these exercises as instructed by your physician, physical therapist or athletic trainer. Progress the resistance and repetitions only as guided.   You may experience muscle soreness or fatigue, but the pain or discomfort you are trying to eliminate should never worsen during these exercises. If this pain does worsen, stop and make certain you are following the directions exactly. If the pain is still present after adjustments, discontinue the exercise until you can discuss the trouble with your clinician.   If advised by your physician, during your recovery, avoid activity or exercises which involve actions that place your right / left hand or elbow above your head or behind your back or head. These positions stress the tissues which are trying to heal.  STRENGTH - Scapular Depression and Adduction  With good posture, sit on a firm chair. Supported your arms in front of you with  pillows, arm rests or a table top. Have your elbows in line with the sides of your body.   Gently draw your shoulder blades down and toward your mid-back spine. Gradually increase the tension without tensing the muscles along the top of your shoulders and the back of your neck.   Hold for __________ seconds. Slowly release the tension and relax your muscles completely before completing the next repetition.   After you have practiced this exercise, remove the arm support and complete it in standing as well as sitting.  Repeat __________ times. Complete this exercise __________ times per day.  STRENGTH - External Rotators  Secure a rubber exercise band/tubing to a fixed object so that it is at the same height as your right / left elbow when you are standing or sitting on a firm surface.   Stand or sit so that the secured exercise band/tubing is at your side that is not injured.   Bend your elbow 90 degrees. Place a folded towel or small pillow under your right / left arm so that your elbow is a few inches away from your side.   Keeping the tension on the exercise band/tubing, pull it away from your body, as if pivoting on your elbow. Be sure to keep  your body steady so that the movement is only coming from your shoulder rotating.   Hold __________ seconds. Release the tension in a controlled manner as you return to the starting position.  Repeat __________ times. Complete this exercise __________ times per day.  STRENGTH - Supraspinatus  Stand or sit with good posture. Grasp a __________ weight or an exercise band/tubing so that your hand is "thumbs-up," like when you shake hands.   Slowly lift your right / left hand from your thigh into the air, traveling about 30 degrees from straight out at your side. Lift your hand to shoulder height or as far as you can without increasing any shoulder pain. Initially, many people do not lift their hands above shoulder height.   Avoid shrugging your right  / left shoulder as your arm rises by keeping your shoulder blade tucked down and toward your mid-back spine.   Hold for __________ seconds. Control the descent of your hand as you slowly return to your starting position.  Repeat __________ times. Complete this exercise __________ times per day.  STRENGTH - Shoulder Extensors  Secure a rubber exercise band/tubing so that it is at the height of your shoulders when you are either standing or sitting on a firm arm-less chair.   With a thumbs-up grip, grasp an end of the band/tubing in each hand. Straighten your elbows and lift your hands straight in front of you at shoulder height. Step back away from the secured end of band/tubing until it becomes tense.   Squeezing your shoulder blades together, pull your hands down to the sides of your thighs. Do not allow your hands to go behind you.   Hold for __________ seconds. Slowly ease the tension on the band/tubing as you reverse the directions and return to the starting position.  Repeat __________ times. Complete this exercise __________ times per day.  STRENGTH - Scapular Retractors  Secure a rubber exercise band/tubing so that it is at the height of your shoulders when you are either standing or sitting on a firm arm-less chair.   With a palm-down grip, grasp an end of the band/tubing in each hand. Straighten your elbows and lift your hands straight in front of you at shoulder height. Step back away from the secured end of band/tubing until it becomes tense.   Squeezing your shoulder blades together, draw your elbows back as you bend them. Keep your upper arm lifted away from your body throughout the exercise.   Hold __________ seconds. Slowly ease the tension on the band/tubing as you reverse the directions and return to the starting position.  Repeat __________ times. Complete this exercise __________ times per day. STRENGTH - Scapular Depressors  Find a sturdy chair without wheels, such as a  from a dining room table.   Keeping your feet on the floor, lift your bottom from the seat and lock your elbows.   Keeping your elbows straight, allow gravity to pull your body weight down. Your shoulders will rise toward your ears.   Raise your body against gravity by drawing your shoulder blades down your back, shortening the distance between your shoulders and ears. Although your feet should always maintain contact with the floor, your feet should progressively support less body weight as you get stronger.   Hold __________ seconds. In a controlled and slow manner, lower your body weight to begin the next repetition.  Repeat __________ times. Complete this exercise __________ times per day.  Document Released: 01/18/2005 Document Revised: 02/23/2011  Document Reviewed: 06/18/2008 [facility] Patient Information 2012 [place], [place].

## 2011-12-19 ENCOUNTER — Other Ambulatory Visit (INDEPENDENT_AMBULATORY_CARE_PROVIDER_SITE_OTHER): Payer: BC Managed Care – PPO | Admitting: Family Medicine

## 2011-12-19 DIAGNOSIS — I10 Essential (primary) hypertension: Secondary | ICD-10-CM

## 2011-12-19 DIAGNOSIS — E7800 Pure hypercholesterolemia, unspecified: Secondary | ICD-10-CM

## 2011-12-19 DIAGNOSIS — E119 Type 2 diabetes mellitus without complications: Secondary | ICD-10-CM

## 2011-12-19 LAB — CBC WITH DIFFERENTIAL/PLATELET
Eosinophils Relative: 2 % (ref 0–5)
HCT: 46.2 % — ABNORMAL HIGH (ref 36.0–46.0)
Hemoglobin: 15.3 g/dL — ABNORMAL HIGH (ref 12.0–15.0)
Lymphocytes Relative: 39 % (ref 12–46)
Lymphs Abs: 3.1 10*3/uL (ref 0.7–4.0)
MCV: 85.9 fL (ref 78.0–100.0)
Monocytes Absolute: 0.6 10*3/uL (ref 0.1–1.0)
Monocytes Relative: 7 % (ref 3–12)
Neutro Abs: 4 10*3/uL (ref 1.7–7.7)
RDW: 13.1 % (ref 11.5–15.5)
WBC: 7.8 10*3/uL (ref 4.0–10.5)

## 2011-12-19 LAB — COMPREHENSIVE METABOLIC PANEL
Albumin: 4.3 g/dL (ref 3.5–5.2)
BUN: 14 mg/dL (ref 6–23)
CO2: 27 mEq/L (ref 19–32)
Calcium: 10 mg/dL (ref 8.4–10.5)
Chloride: 98 mEq/L (ref 96–112)
Glucose, Bld: 184 mg/dL — ABNORMAL HIGH (ref 70–99)
Potassium: 4.4 mEq/L (ref 3.5–5.3)
Sodium: 137 mEq/L (ref 135–145)
Total Protein: 7 g/dL (ref 6.0–8.3)

## 2011-12-19 LAB — LIPID PANEL
HDL: 51 mg/dL (ref >39)
Triglycerides: 488 mg/dL — ABNORMAL HIGH (ref <150)

## 2011-12-19 NOTE — Progress Notes (Signed)
Pt here for labs only. 

## 2011-12-20 ENCOUNTER — Encounter (INDEPENDENT_AMBULATORY_CARE_PROVIDER_SITE_OTHER): Payer: BC Managed Care – PPO | Admitting: Surgery

## 2011-12-20 DIAGNOSIS — J45909 Unspecified asthma, uncomplicated: Secondary | ICD-10-CM | POA: Insufficient documentation

## 2011-12-20 DIAGNOSIS — I10 Essential (primary) hypertension: Secondary | ICD-10-CM | POA: Insufficient documentation

## 2011-12-20 DIAGNOSIS — J309 Allergic rhinitis, unspecified: Secondary | ICD-10-CM | POA: Insufficient documentation

## 2011-12-20 DIAGNOSIS — E7800 Pure hypercholesterolemia, unspecified: Secondary | ICD-10-CM | POA: Insufficient documentation

## 2011-12-20 DIAGNOSIS — F419 Anxiety disorder, unspecified: Secondary | ICD-10-CM | POA: Insufficient documentation

## 2011-12-22 NOTE — Progress Notes (Signed)
Reviewed and agree.

## 2011-12-27 ENCOUNTER — Encounter: Payer: Self-pay | Admitting: Radiology

## 2012-02-07 ENCOUNTER — Ambulatory Visit (INDEPENDENT_AMBULATORY_CARE_PROVIDER_SITE_OTHER): Payer: BC Managed Care – PPO | Admitting: Physician Assistant

## 2012-02-07 VITALS — BP 115/80 | HR 91 | Temp 98.2°F | Resp 18 | Ht 66.0 in | Wt 248.0 lb

## 2012-02-07 DIAGNOSIS — R319 Hematuria, unspecified: Secondary | ICD-10-CM

## 2012-02-07 DIAGNOSIS — R350 Frequency of micturition: Secondary | ICD-10-CM

## 2012-02-07 DIAGNOSIS — R300 Dysuria: Secondary | ICD-10-CM

## 2012-02-07 DIAGNOSIS — N390 Urinary tract infection, site not specified: Secondary | ICD-10-CM

## 2012-02-07 LAB — POCT URINALYSIS DIPSTICK
Glucose, UA: 500
Ketones, UA: 15
Nitrite, UA: POSITIVE
Protein, UA: 300
Spec Grav, UA: 1.02
Urobilinogen, UA: 2
pH, UA: 5

## 2012-02-07 LAB — POCT UA - MICROSCOPIC ONLY
Casts, Ur, LPF, POC: NEGATIVE
Crystals, Ur, HPF, POC: NEGATIVE
Yeast, UA: NEGATIVE

## 2012-02-07 MED ORDER — NITROFURANTOIN MONOHYD MACRO 100 MG PO CAPS: 100.0000 mg | ORAL_CAPSULE | Freq: Two times a day (BID) | ORAL | Status: DC

## 2012-02-07 MED ORDER — PHENAZOPYRIDINE HCL 200 MG PO TABS: 200.0000 mg | ORAL_TABLET | Freq: Three times a day (TID) | ORAL | Status: DC | PRN

## 2012-02-07 NOTE — Progress Notes (Signed)
  Subjective:    Patient ID: [name], female    DOB: 1966-02-09, 46 y.o.   MRN: [id number]  HPI 46 year old female presents with acute onset of dysuria, suprapubic tenderness, hematuria, and frequency.  Denies flank pain, vomiting, fever, chills, or syncope.  Does admit to some nausea this morning. States it has been a few years since she has had a urinary tract infection, but when she gets them they do usually "get bad."  Admits to taking AZO this a.m which helped slightly with the pain.     Review of Systems  Constitutional: Negative for fever and chills.  Gastrointestinal: Positive for nausea and abdominal pain (suprapubic). Negative for vomiting.  Genitourinary: Positive for dysuria, urgency, frequency and hematuria. Negative for flank pain and vaginal discharge.  All other systems reviewed and are negative.       Objective:   Physical Exam  Constitutional: She is oriented to person, place, and time. She appears well-developed and well-nourished.  HENT:  Head: Normocephalic and atraumatic.  Right Ear: External ear normal.  Left Ear: External ear normal.  Mouth/Throat: No oropharyngeal exudate.  Eyes: Conjunctivae normal are normal.  Neck: Normal range of motion.  Cardiovascular: Normal rate, regular rhythm and normal heart sounds.   Pulmonary/Chest: Effort normal and breath sounds normal.  Abdominal: Soft. Bowel sounds are normal. There is tenderness (suprapubic).  Neurological: She is alert and oriented to person, place, and time.  Psychiatric: She has a normal mood and affect. Her behavior is normal. Judgment and thought content normal.     Results for orders placed in visit on 02/07/12  POCT UA - MICROSCOPIC ONLY      Component Value Range   WBC, Ur, HPF, POC 25-30     RBC, urine, microscopic TNTC     Bacteria, U Microscopic 1+     Mucus, UA small     Epithelial cells, urine per micros 2-5     Crystals, Ur, HPF, POC neg     Casts, Ur, LPF, POC neg     Yeast, UA  neg    POCT URINALYSIS DIPSTICK      Component Value Range   Color, UA dark orange     Clarity, UA cloudy     Glucose, UA 500     Bilirubin, UA small     Ketones, UA 15     Spec Grav, UA 1.020     Blood, UA large     pH, UA 5.0     Protein, UA 300     Urobilinogen, UA 2.0     Nitrite, UA positive     Leukocytes, UA large (3+)           Assessment & Plan:   1. UTI (lower urinary tract infection)  POCT UA - Microscopic Only, POCT urinalysis dipstick, nitrofurantoin, macrocrystal-monohydrate, (MACROBID) 100 MG capsule, Urine culture  2. Dysuria  phenazopyridine (PYRIDIUM) 200 MG tablet   Due to allergies, will treat with Macrobid 100 mg bid x 7 days RTC in 24-48 hours if symptoms fail to improve Pyridium prn pain Urine culture sent

## 2012-02-08 ENCOUNTER — Encounter (INDEPENDENT_AMBULATORY_CARE_PROVIDER_SITE_OTHER): Payer: BC Managed Care – PPO | Admitting: Surgery

## 2012-02-10 LAB — URINE CULTURE: Colony Count: 80000

## 2012-02-12 ENCOUNTER — Other Ambulatory Visit: Payer: Self-pay | Admitting: Physician Assistant

## 2012-02-12 ENCOUNTER — Encounter: Payer: Self-pay | Admitting: Family Medicine

## 2012-02-12 MED ORDER — SULFAMETHOXAZOLE-TRIMETHOPRIM 800-160 MG PO TABS: 1.0000 | ORAL_TABLET | Freq: Two times a day (BID) | ORAL | Status: DC

## 2012-02-13 ENCOUNTER — Telehealth: Payer: Self-pay | Admitting: Family Medicine

## 2012-02-13 MED ORDER — FLUCONAZOLE 150 MG PO TABS: 150.0000 mg | ORAL_TABLET | Freq: Once | ORAL | Status: DC

## 2012-02-13 NOTE — Telephone Encounter (Signed)
Spoke with pt over the phone.

## 2012-02-13 NOTE — Telephone Encounter (Signed)
Message reviewed and then chart further reviewed.  Diflucan sent into pharmacy.  Pt also contacted regarding Urine culture and need to start different antibiotic.  Bactrim sent to pharmacy.  No further action warranted at this time.  KMS

## 2012-02-13 NOTE — Telephone Encounter (Signed)
Patient sent message via husband.  Saw Heather, PA-C on 02/05/12 for severe UTI.  Started Macrobid and Phenazopyridine.  Have completed all but one pill and am better considering where I was as far as pain and blood in urine but over the last 24 hours have started with lower back pain and some of it is sharp in the lower right area of back and nausea has increased.  Took a phenazopyridine last night and that helped ease the back pain.  Still a little painful to urinate.  Not sure the Macrobid has totally wiped out the UTI for the amount of infection that Herbert Seta said I had.  She questioned whether it would do what was needed but decided to give it a try.  Also need some Diflucan for yeast infection that has accompanied the Macrobid and possibly a refill if changing medication.

## 2012-02-21 ENCOUNTER — Encounter: Payer: Self-pay | Admitting: *Deleted

## 2012-02-28 ENCOUNTER — Encounter: Payer: Self-pay | Admitting: Family Medicine

## 2012-02-29 ENCOUNTER — Ambulatory Visit (INDEPENDENT_AMBULATORY_CARE_PROVIDER_SITE_OTHER): Payer: BC Managed Care – PPO | Admitting: Family Medicine

## 2012-02-29 ENCOUNTER — Ambulatory Visit: Payer: BC Managed Care – PPO

## 2012-02-29 VITALS — BP 139/83 | HR 101 | Temp 99.0°F | Resp 18 | Ht 66.5 in | Wt 251.0 lb

## 2012-02-29 DIAGNOSIS — J45909 Unspecified asthma, uncomplicated: Secondary | ICD-10-CM

## 2012-02-29 DIAGNOSIS — R110 Nausea: Secondary | ICD-10-CM

## 2012-02-29 DIAGNOSIS — J40 Bronchitis, not specified as acute or chronic: Secondary | ICD-10-CM

## 2012-02-29 DIAGNOSIS — J189 Pneumonia, unspecified organism: Secondary | ICD-10-CM

## 2012-02-29 DIAGNOSIS — R05 Cough: Secondary | ICD-10-CM

## 2012-02-29 DIAGNOSIS — E119 Type 2 diabetes mellitus without complications: Secondary | ICD-10-CM

## 2012-02-29 LAB — POCT CBC
Lymph, poc: 2.1 (ref 0.6–3.4)
MCH, POC: 27.5 pg (ref 27–31.2)
MCV: 89.5 fL (ref 80–97)
MID (cbc): 0.5 (ref 0–0.9)
POC LYMPH PERCENT: 32.2 %L (ref 10–50)
Platelet Count, POC: 368 10*3/uL (ref 142–424)
RDW, POC: 13.4 %
WBC: 6.6 10*3/uL (ref 4.6–10.2)

## 2012-02-29 MED ORDER — AZITHROMYCIN 500 MG PO TABS: 500.0000 mg | ORAL_TABLET | Freq: Every day | ORAL | Status: DC

## 2012-02-29 MED ORDER — ALBUTEROL SULFATE HFA 108 (90 BASE) MCG/ACT IN AERS: 2.0000 | INHALATION_SPRAY | Freq: Four times a day (QID) | RESPIRATORY_TRACT | Status: DC | PRN

## 2012-02-29 MED ORDER — PREDNISONE 20 MG PO TABS: 40.0000 mg | ORAL_TABLET | Freq: Every day | ORAL | Status: DC

## 2012-02-29 MED ORDER — ONDANSETRON 4 MG PO TBDP: 4.0000 mg | ORAL_TABLET | Freq: Three times a day (TID) | ORAL | Status: DC | PRN

## 2012-02-29 NOTE — Progress Notes (Signed)
Subjective:    Patient ID: [name], female    DOB: 08/09/1965, 46 y.o.   MRN: [id number]  HPI [name] is a 46 y.o. female  Hx of asthma. Usual pt of Dr. Nilda Simmer.  Cold symptoms around thanksgiving.  Sore with cough, and increased cough for past week, increased asthma symptoms and cough, now with post tussive emesis.  Nausea this morning.  Hx of lap band, no problems with the band. Hx of emesis in past with coughing spells. No recent prolonged car travel or air travel, no recent calf pain or swelling.no hx of DVT/PE. Did spend about 2 weeks with daughter at [facility]. Less active, but was up and   Using albuterol every 4 hours now for past 3 days. Low grade fever few days ago.   Had flu shot about a month ago.   Hx of DM, blood sugars in 150's at home. Has not been able to take meds yet this morning.   Review of Systems  Constitutional: Positive for fever (low grade. ).  Respiratory: Positive for cough and shortness of breath.   Gastrointestinal: Positive for nausea and vomiting.  All other systems reviewed and are negative.      Objective:   Physical Exam  Vitals reviewed. Constitutional: She is oriented to person, place, and time. She appears well-developed and well-nourished. No distress.  HENT:  Head: Normocephalic and atraumatic.  Right Ear: Hearing, tympanic membrane, external ear and ear canal normal.  Left Ear: Hearing, tympanic membrane, external ear and ear canal normal.  Nose: Nose normal.  Mouth/Throat: Oropharynx is clear and moist. No oropharyngeal exudate.  Eyes: Conjunctivae normal and EOM are normal. Pupils are equal, round, and reactive to light.  Cardiovascular: Normal rate, regular rhythm, normal heart sounds and intact distal pulses.   No murmur heard. Pulmonary/Chest: Effort normal. No respiratory distress. She has no decreased breath sounds. She has wheezes. She has no rhonchi.        Few coarse breath sounds mid to lower - right, with end  expiratory wheeze.   Neurological: She is alert and oriented to person, place, and time.  Skin: Skin is warm and dry. No rash noted.  Psychiatric: She has a normal mood and affect. Her behavior is normal.   [facility] reading (PRIMARY) by  Dr. Neva Seat: new infiltrate base of RUL compared to CXR in 2011. Wedge shape?  Radiology report: Findings: There is an opacity in the right upper lobe with some  mild elevation of the minor fissure. The lungs are otherwise  clear. Pulmonary vasculature is normal. No pleural effusions.  Cardiomediastinal silhouette is within normal limits.  IMPRESSION:  1. Right upper lobe airspace consolidation and atelectasis  concerning for pneumonia. Follow-up radiographs in 1 month after  appropriate trial of antimicrobial therapy is recommended to  exclude the possibility of a central obstructing lesion.   Results for orders placed in visit on 02/29/12  POCT CBC      Component Value Range   WBC 6.6  4.6 - 10.2 K/uL   Lymph, poc 2.1  0.6 - 3.4   POC LYMPH PERCENT 32.2  10 - 50 %L   MID (cbc) 0.5  0 - 0.9   POC MID % 7.8  0 - 12 %M   POC Granulocyte 4.0  2 - 6.9   Granulocyte percent 60.0  37 - 80 %G   RBC 4.95  4.04 - 5.48 M/uL   Hemoglobin 13.6  12.2 - 16.2 g/dL  HCT, POC 44.3  37.7 - 47.9 %   MCV 89.5  80 - 97 fL   MCH, POC 27.5  27 - 31.2 pg   MCHC 30.7 (*) 31.8 - 35.4 g/dL   RDW, POC 16.1     Platelet Count, POC 368  142 - 424 K/uL   MPV 8.1  0 - 99.8 fL  GLUCOSE, POCT (MANUAL RESULT ENTRY)      Component Value Range   POC Glucose 210 (*) 70 - 99 mg/dl        Assessment & Plan:  [name] is a 46 y.o. female 1. Asthmatic bronchitis  DG Chest 2 View, POCT CBC  2. Cough  DG Chest 2 View, POCT CBC  3. Asthma  predniSONE (DELTASONE) 20 MG tablet, albuterol (PROVENTIL HFA;VENTOLIN HFA) 108 (90 BASE) MCG/ACT inhaler  4. Pneumonia  azithromycin (ZITHROMAX) 500 MG tablet  5. DM type 2 (diabetes mellitus, type 2)  POCT glucose (manual entry)  6.  Nausea  ondansetron (ZOFRAN ODT) 4 MG disintegrating tablet     Probable RUL pneumonia, complicated by underlying asthma requiring frequent albuterol. Discussed various antibiotic allergies and classes. In review of the Levaquin and Tequin - had rash and shortness of breath, but was being treated for a pneumonia at the time, not sure about Avelox.  Has tolerated azithro, but z pak not always effective. As WBC in normal range, will try azithro 500mg  QD for 5 days for pneumonia. Prednisone 40mg  qd for 5 days due to worsening wheeze and need for albuterol,  -watch cbg's on prednisione - call if remaining above 200. Recheck in 48 hours, then will need repeat cxr in 4-6 weeks after treatment to verify resolution and exclude central obstructing lesion.  posttussive emesis - zofran if needed - tx for cough as above.

## 2012-02-29 NOTE — Patient Instructions (Addendum)
Your x ray report indicates a probable pneumonia. Start the antibiotic as discussed, albuterol every 4 hours as needed, prednisone for the asthma. zofran if needed for nausea.  Recheck in 2 days. Return to the clinic or go to the nearest emergency room if any of your symptoms worsen or new symptoms occur.  Pneumonia, Adult Pneumonia is an infection of the lungs.  CAUSES Pneumonia may be caused by bacteria or a virus. Usually, these infections are caused by breathing infectious particles into the lungs (respiratory tract). SYMPTOMS   Cough.  Fever.  Chest pain.  Increased rate of breathing.  Wheezing.  Mucus production. DIAGNOSIS  If you have the common symptoms of pneumonia, your caregiver will typically confirm the diagnosis with a chest X-ray. The X-ray will show an abnormality in the lung (pulmonary infiltrate) if you have pneumonia. Other tests of your blood, urine, or sputum may be done to find the specific cause of your pneumonia. Your caregiver may also do tests (blood gases or pulse oximetry) to see how well your lungs are working. TREATMENT  Some forms of pneumonia may be spread to other people when you cough or sneeze. You may be asked to wear a mask before and during your exam. Pneumonia that is caused by bacteria is treated with antibiotic medicine. Pneumonia that is caused by the influenza virus may be treated with an antiviral medicine. Most other viral infections must run their course. These infections will not respond to antibiotics.  PREVENTION A pneumococcal shot (vaccine) is available to prevent a common bacterial cause of pneumonia. This is usually suggested for:  People over 66 years old.  Patients on chemotherapy.  People with chronic lung problems, such as bronchitis or emphysema.  People with immune system problems. If you are over 65 or have a high risk condition, you may receive the pneumococcal vaccine if you have not received it before. In some countries,  a routine influenza vaccine is also recommended. This vaccine can help prevent some cases of pneumonia.You may be offered the influenza vaccine as part of your care. If you smoke, it is time to quit. You may receive instructions on how to stop smoking. Your caregiver can provide medicines and counseling to help you quit. HOME CARE INSTRUCTIONS   Cough suppressants may be used if you are losing too much rest. However, coughing protects you by clearing your lungs. You should avoid using cough suppressants if you can.  Your caregiver may have prescribed medicine if he or she thinks your pneumonia is caused by a bacteria or influenza. Finish your medicine even if you start to feel better.  Your caregiver may also prescribe an expectorant. This loosens the mucus to be coughed up.  Only take over-the-counter or prescription medicines for pain, discomfort, or fever as directed by your caregiver.  Do not smoke. Smoking is a common cause of bronchitis and can contribute to pneumonia. If you are a smoker and continue to smoke, your cough may last several weeks after your pneumonia has cleared.  A cold steam vaporizer or humidifier in your room or home may help loosen mucus.  Coughing is often worse at night. Sleeping in a semi-upright position in a recliner or using a couple pillows under your head will help with this.  Get rest as you feel it is needed. Your body will usually let you know when you need to rest. SEEK IMMEDIATE MEDICAL CARE IF:   Your illness becomes worse. This is especially true if you are  elderly or weakened from any other disease.  You cannot control your cough with suppressants and are losing sleep.  You begin coughing up blood.  You develop pain which is getting worse or is uncontrolled with medicines.  You have a fever.  Any of the symptoms which initially brought you in for treatment are getting worse rather than better.  You develop shortness of breath or chest  pain. MAKE SURE YOU:   Understand these instructions.  Will watch your condition.  Will get help right away if you are not doing well or get worse. Document Released: 03/06/2005 Document Revised: 05/29/2011 Document Reviewed: 05/26/2010 [facility] Patient Information 2013 [place], [place].

## 2012-03-02 ENCOUNTER — Ambulatory Visit (INDEPENDENT_AMBULATORY_CARE_PROVIDER_SITE_OTHER): Payer: BC Managed Care – PPO | Admitting: Family Medicine

## 2012-03-02 VITALS — BP 120/82 | HR 86 | Temp 98.8°F | Resp 18 | Ht 66.5 in | Wt 251.0 lb

## 2012-03-02 DIAGNOSIS — R197 Diarrhea, unspecified: Secondary | ICD-10-CM

## 2012-03-02 DIAGNOSIS — J189 Pneumonia, unspecified organism: Secondary | ICD-10-CM

## 2012-03-02 DIAGNOSIS — J45909 Unspecified asthma, uncomplicated: Secondary | ICD-10-CM

## 2012-03-02 NOTE — Patient Instructions (Addendum)
Your oxygen levels looked good today but if your breathing begins to decline or worsens please return to the center to be reevaluated.  If the diarrhea gets worse, please return to the center to be reevaluated as well.  Okay to continue taking Pepto Bismol as directed.  Please watch your blood sugar levels and complete all medications as directed.  If blood sugars are running high, give [place] a call. Make sure to confirm appointment with [name] on Wednesday, Dec. 17th or use [facility] card to be seen my me. Return to the clinic or go to the nearest emergency room if any of your symptoms worsen or new symptoms occur.

## 2012-03-02 NOTE — Progress Notes (Signed)
  Subjective:    Patient ID: [name], female    DOB: Jul 28, 1965, 46 y.o.   MRN: [id number]  HPI [name] is a 46 y.o. female  Follow up RUL PNA - see ov 2 days ago,  complicated by underlying asthma requiring frequent albuterol. As WBC in normal range, started azithro 500mg  QD for 5 days for pneumonia. Prednisone 40mg  qd for 5 days due to worsening wheeze and need for albuterol,  Last albuterol 3 hrs ago - using this every 4 to 6 hours.  No fever since last ov - min shortness of breath, some better, and   Diarrhea yesterday, none yet today. pepto yesterday did help.   cbg's 160-175 this morning.    Review of Systems  Constitutional: Negative for fever.  Respiratory: Positive for cough and wheezing.   Gastrointestinal: Positive for diarrhea. Negative for nausea, vomiting and abdominal pain.        Objective:   Physical Exam  Vitals reviewed. Constitutional: She is oriented to person, place, and time. She appears well-developed and well-nourished. No distress.  HENT:  Head: Normocephalic and atraumatic.  Right Ear: Hearing, tympanic membrane and ear canal normal.  Left Ear: Hearing, tympanic membrane and ear canal normal.  Mouth/Throat: Oropharynx is clear and moist. No oropharyngeal exudate.  Eyes: Conjunctivae normal and EOM are normal. Pupils are equal, round, and reactive to light.  Cardiovascular: Normal rate, regular rhythm, normal heart sounds and intact distal pulses.   No murmur heard. Pulmonary/Chest: Effort normal. No respiratory distress. She has wheezes (faint end expiratory. ) in the right upper field and the left upper field. She has rhonchi (distant coarse b.s RUL. ) in the right upper field.  Abdominal: She exhibits no distension. There is no tenderness.  Neurological: She is alert and oriented to person, place, and time.  Skin: Skin is warm and dry. No rash noted.  Psychiatric: She has a normal mood and affect. Her behavior is normal.        Assessment & Plan:  [name] is a 46 y.o. female 1. Pneumonia   2. Asthma   3. Diarrhea     Pneumonia, with underlying asthma flair.  On prednisone and albuterol for asthma - posttussive emesis has resolved.  Reassuring temp in office and no fevers at home.  Normal oximetry and moving air well.  Continue same course for now, including albuterol up to every 4-6 hours if needed for wheeze, and azithro, prednisone.  Recheck in 4 days with myself or [name], sooner if any interval worsening.  rtc precations given  Diarrhea - resolved this am, but ok to use pepto - rtc if persists/worsens, but limited in abx choices with allergies.   Patient Instructions  Your oxygen levels looked good today but if your breathing begins to decline or worsens please return to the center to be reevaluated.  If the diarrhea gets worse, please return to the center to be reevaluated as well.  Okay to continue taking Pepto Bismol as directed.  Please watch your blood sugar levels and complete all medications as directed.  If blood sugars are running high, give [place] a call. Make sure to confirm appointment with [name] on Wednesday, Dec. 17th or use [facility] card to be seen my me. Return to the clinic or go to the nearest emergency room if any of your symptoms worsen or new symptoms occur.

## 2012-03-04 ENCOUNTER — Telehealth: Payer: Self-pay

## 2012-03-04 NOTE — Telephone Encounter (Signed)
Dr Katrinka Blazing,  Patients daughter has an appt with you on Wednesday at 3:30 and she was wondering if she could also see you for a recheck that day. Let us know at the appt center if it is ok and we will schedule.  Thanks, Amy

## 2012-03-04 NOTE — Telephone Encounter (Signed)
Yes, can see her at 3:45 for follow-up.  KMS

## 2012-03-06 ENCOUNTER — Encounter: Payer: Self-pay | Admitting: Family Medicine

## 2012-03-06 ENCOUNTER — Ambulatory Visit (INDEPENDENT_AMBULATORY_CARE_PROVIDER_SITE_OTHER): Payer: BC Managed Care – PPO | Admitting: Family Medicine

## 2012-03-06 VITALS — BP 119/68 | HR 89 | Temp 98.3°F | Resp 17 | Ht 66.0 in | Wt 251.0 lb

## 2012-03-06 DIAGNOSIS — F411 Generalized anxiety disorder: Secondary | ICD-10-CM

## 2012-03-06 DIAGNOSIS — F419 Anxiety disorder, unspecified: Secondary | ICD-10-CM

## 2012-03-06 DIAGNOSIS — J45909 Unspecified asthma, uncomplicated: Secondary | ICD-10-CM

## 2012-03-06 DIAGNOSIS — E119 Type 2 diabetes mellitus without complications: Secondary | ICD-10-CM

## 2012-03-06 DIAGNOSIS — J189 Pneumonia, unspecified organism: Secondary | ICD-10-CM

## 2012-03-06 MED ORDER — ALPRAZOLAM 0.5 MG PO TABS: 0.5000 mg | ORAL_TABLET | Freq: Every day | ORAL | Status: DC

## 2012-03-06 MED ORDER — BECLOMETHASONE DIPROPIONATE 80 MCG/ACT IN AERS: 2.0000 | INHALATION_SPRAY | Freq: Two times a day (BID) | RESPIRATORY_TRACT | Status: DC | PRN

## 2012-03-06 NOTE — Progress Notes (Signed)
[address]   [place], [place]  [address]   [phone]  Subjective:    Patient ID: [name], female    DOB: 12/16/1965, 46 y.o.   MRN: [id number]  HPIThis 46 y.o. female presents for evaluation of pneumonia.  Four days follow-up for pneumonia.  Last fever one week ago.  No chills; +nighttime sweats.  Mild SOB; worsens at end of day.  +coughing moderately; no sputum.  Wheezing improved; using Albuterol twice daily; was using more often.  No headache; no ear pain; no sore throat; +hoarseness. No rhinorrhea; no nasal congestion.   Head cold before Thanksgiving.  No n/v/d.  Energy level down.  Going to work and sleeping.  Husband really helping.  Taking Zithromax finished yesterday; one Prednisone remaining today.  Using Proair, need new rx for QVAR.  Checking sugars 181, 151, 126.  Watching food intake.  No yeast.  Gurgling in chest on R improved.  Decreased R sided chest pain.      Anxiety:  Multiple family stressors currently; daughter with uncontrolled Crohn's disease ;other daughter with severe fibromyalgia and requires a lot of assistance with infant child.  Requesting refill of Xanax.   Review of Systems  Constitutional: Positive for diaphoresis and fatigue. Negative for fever and chills.  HENT: Positive for voice change. Negative for ear pain, congestion, sore throat, rhinorrhea, sneezing, postnasal drip and sinus pressure.   Respiratory: Positive for cough, shortness of breath and wheezing.   Gastrointestinal: Negative for nausea, vomiting and diarrhea.  Skin: Negative for rash.  Neurological: Negative for headaches.    Past Medical History  Diagnosis Date  . Diabetes mellitus   . Hypertension   . Anemia   . Migraines   . Thyroid disease   . Colon polyp   . IBS (irritable bowel syndrome)   . Fatigue   . Night sweats   . Lump in female breast   . Asthma   . Sinus problem   . Reflux   . Methicillin resistant Staphylococcus aureus in conditions classified elsewhere and  of unspecified site   . Paresthesias   . Migraine, unspecified, without mention of intractable migraine without mention of status migrainosus   . Unspecified acute reaction to stress   . Nonspecific abnormal results of thyroid function study   . Essential hypertension, benign   . Personal history of colonic polyps   . Pure hypercholesterolemia   . Type II or unspecified type diabetes mellitus without mention of complication, not stated as uncontrolled   . Morbid obesity   . Other B-complex deficiencies   . Edema   . Unspecified sleep apnea   . Symptomatic states associated with artificial menopause     Past Surgical History  Procedure Date  . Cholecystectomy 03/1989  . Abdominal hysterectomy 1997    complete in 1997, partial was in 1995  . Knee surgery 2006  . Laparoscopic gastric banding 05/2009  . Cesarean section 1987  and 1989  . Abcess removal 1997    MRSA  . Tonsillectomy and adenoidectomy 1974    Prior to Admission medications   Medication Sig Start Date End Date Taking? Authorizing Provider  albuterol (PROVENTIL HFA;VENTOLIN HFA) 108 (90 BASE) MCG/ACT inhaler Inhale 2 puffs into the lungs every 6 (six) hours as needed for wheezing. 02/29/12  Yes Shade Flood, MD  ALPRAZolam Prudy Feeler) 0.5 MG tablet Take 1 tablet (0.5 mg total) by mouth daily. 03/06/12  Yes Ethelda Chick, MD  atorvastatin (LIPITOR) 20 MG  tablet Take 20 mg by mouth daily.     Yes Historical Provider, MD  azelastine (ASTELIN) 137 MCG/SPRAY nasal spray Place 1 spray into the nose 2 (two) times daily. Use in each nostril as directed   Yes Historical Provider, MD  azithromycin (ZITHROMAX) 500 MG tablet Take 1 tablet (500 mg total) by mouth daily. 02/29/12  Yes Shade Flood, MD  beclomethasone (QVAR) 80 MCG/ACT inhaler Inhale 2 puffs into the lungs every 12 (twelve) hours as needed. 03/06/12  Yes Ethelda Chick, MD  CALCIUM PO Take by mouth 3 (three) times daily.     Yes Historical Provider, MD  Cetirizine  HCl (ZYRTEC PO) Take by mouth daily.     Yes Historical Provider, MD  cyclobenzaprine (FLEXERIL) 10 MG tablet Take 10 mg by mouth 3 (three) times daily as needed.   Yes Historical Provider, MD  Fexofenadine HCl (ALLEGRA PO) Take by mouth.     Yes Historical Provider, MD  fluconazole (DIFLUCAN) 150 MG tablet Take 1 tablet (150 mg total) by mouth once. Repeat if needed 02/13/12  Yes Heather M Marte, PA-C  fluticasone (FLONASE) 50 MCG/ACT nasal spray Place 2 sprays into the nose daily.   Yes Historical Provider, MD  GlipiZIDE-MetFORMIN HCl (METAGLIP PO) Take by mouth 2 (two) times daily. Patient taking 1000/2.5    Yes Historical Provider, MD  hydrochlorothiazide 25 MG tablet Take 25 mg by mouth daily.     Yes Historical Provider, MD  levalbuterol (XOPENEX) 1.25 MG/3ML nebulizer solution Take 1.25 mg by nebulization daily as needed.   Yes Historical Provider, MD  losartan (COZAAR) 100 MG tablet Take 100 mg by mouth daily.     Yes Historical Provider, MD  Multiple Vitamin (MULTIVITAMIN PO) Take by mouth daily.     Yes Historical Provider, MD  NEOMYCIN-POLYMYXIN-HC, OTIC, (CORTISPORIN) 1 % SOLN Place 4 drops into the left ear every 6 (six) hours. 10/16/11  Yes Gwenlyn Found Copland, MD  ondansetron (ZOFRAN ODT) 4 MG disintegrating tablet Take 1 tablet (4 mg total) by mouth every 8 (eight) hours as needed for nausea. 02/29/12  Yes Shade Flood, MD  predniSONE (DELTASONE) 20 MG tablet Take 2 tablets (40 mg total) by mouth daily. 02/29/12  Yes Shade Flood, MD  clarithromycin (BIAXIN) 500 MG tablet Take 1 tablet (500 mg total) by mouth 2 (two) times daily. 12/18/11   Ethelda Chick, MD  nitrofurantoin, macrocrystal-monohydrate, (MACROBID) 100 MG capsule Take 1 capsule (100 mg total) by mouth 2 (two) times daily. 02/07/12   Heather Jaquita Rector, PA-C  phenazopyridine (PYRIDIUM) 200 MG tablet Take 1 tablet (200 mg total) by mouth 3 (three) times daily as needed for pain. 02/07/12   Heather Jaquita Rector, PA-C    sulfamethoxazole-trimethoprim (SEPTRA DS) 800-160 MG per tablet Take 1 tablet by mouth 2 (two) times daily. 02/12/12   Nelva Nay, PA-C    Allergies  Allergen Reactions  . Amoxicillin Shortness Of Breath and Rash  . Levofloxacin Shortness Of Breath and Rash  . Vancomycin Anaphylaxis  . Ace Inhibitors Cough  . Codeine   . Meperidine Hcl   . Oxycodone-Acetaminophen   . Oxycodone-Acetaminophen   . Penicillins Swelling and Rash  . Tequin (Gatifloxacin) Rash    History   Social History  . Marital Status: Married    Spouse Name: N/A    Number of Children: 3  . Years of Education: N/A   Occupational History  . 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 Smoking status: Never Smoker   . Smokeless tobacco: Not on file  . Alcohol Use: No     Comment: rarely - once per week at most  . Drug Use: No  . Sexually Active: No   Other Topics Concern  . Not on file   Social History Narrative   Exercise: walking three days per week, 30 minutes. Married x 26 years, happily married no abuse.    Family History  Problem Relation Age of Onset  . Diabetes Mother   . Heart disease Mother   . Other Mother     muscle disease  . Hypertension Mother   . Hyperlipidemia Mother   . Diabetes Father   . Heart disease Father   . Stroke Father   . Diabetes Brother   . Heart disease Brother   . Breast cancer      2 Aunts       Objective:   Physical Exam  Nursing note and vitals reviewed. Constitutional: She appears well-developed and well-nourished. No distress.  HENT:  Head: Normocephalic and atraumatic.  Right Ear: External ear normal.  Left Ear: External ear normal.  Nose: Nose normal.  Mouth/Throat: Oropharynx is clear and moist.  Eyes: Conjunctivae normal and EOM are normal. Pupils are equal, round, and reactive to light.  Neck: Normal range of motion. Neck supple.  Cardiovascular: Normal rate, regular rhythm and normal heart sounds.  Exam reveals no gallop.   No  murmur heard. Pulmonary/Chest: Effort normal and breath sounds normal. No respiratory distress. She has no wheezes. She has no rales.  Lymphadenopathy:    She has no cervical adenopathy.  Skin: Skin is warm and dry. No rash noted. She is not diaphoretic.  Psychiatric: She has a normal mood and affect. Her behavior is normal.      PEAK FLOWS:  350, 350, 350. Assessment & Plan:   1. Pneumonia    2. Type II or unspecified type diabetes mellitus without mention of complication, not stated as uncontrolled    3. Asthma    4. Anxiety  ALPRAZolam (XANAX) 0.5 MG tablet     1.  Pneumonia:  Improving.  Continue supportive care with rest, fluids, Mucinex DM.  Will warrant repeat CXR at follow-up visit in six weeks.  RTC immediately for worsening symptoms. 2.  Asthma:  Mild exacerbation at this time.  Continue Albuterol tid for next week and then can wean to PRN use.  Refill of QVAR provided. 3.  DMII:  Stable despite acute infection; continue to monitor closely.   4.  Anxiety: stable despite multiple family stressors; refill of Xanax provided.  Meds ordered this encounter  Medications  . beclomethasone (QVAR) 80 MCG/ACT inhaler    Sig: Inhale 2 puffs into the lungs every 12 (twelve) hours as needed.    Dispense:  1 Inhaler    Refill:  11  . ALPRAZolam (XANAX) 0.5 MG tablet    Sig: Take 1 tablet (0.5 mg total) by mouth daily.    Dispense:  30 tablet    Refill:  2

## 2012-03-06 NOTE — Patient Instructions (Addendum)
1. Pneumonia    2. Type II or unspecified type diabetes mellitus without mention of complication, not stated as uncontrolled    3. Asthma    4. Anxiety  ALPRAZolam (XANAX) 0.5 MG tablet

## 2012-03-08 ENCOUNTER — Encounter (INDEPENDENT_AMBULATORY_CARE_PROVIDER_SITE_OTHER): Payer: BC Managed Care – PPO | Admitting: Surgery

## 2012-03-08 ENCOUNTER — Telehealth: Payer: Self-pay

## 2012-03-08 NOTE — Telephone Encounter (Signed)
Has been in for pneumonia, she states she still has swelling of her glands in her neck. She is still coughing. She finished prednisone on Tuesday, feels like she has gotten worse since finishing the prednisone.

## 2012-03-08 NOTE — Telephone Encounter (Signed)
Patient is still having symptoms of pneumonia she is being treated for. She would like someone to let her know what she should do since she is not getting better   Best [phone]

## 2012-03-08 NOTE — Telephone Encounter (Signed)
Called her to advise, she is using the inhaler as directed. She is advised to return for this. She states she is going to try to come in on Sunday and see Dr Katrinka Blazing, if she can hold out that long, if not she will come in sooner.

## 2012-03-08 NOTE — Telephone Encounter (Signed)
Is she using the inhaler that [name] prescribed?  If she feels like she is worsening, she should probably return and be evaluated

## 2012-03-17 ENCOUNTER — Other Ambulatory Visit: Payer: Self-pay | Admitting: Radiology

## 2012-03-17 MED ORDER — GLIPIZIDE-METFORMIN HCL 2.5-500 MG PO TABS: 2.0000 | ORAL_TABLET | Freq: Two times a day (BID) | ORAL | Status: DC

## 2012-04-02 ENCOUNTER — Other Ambulatory Visit: Payer: Self-pay | Admitting: *Deleted

## 2012-04-02 MED ORDER — ATORVASTATIN CALCIUM 20 MG PO TABS: 20.0000 mg | ORAL_TABLET | Freq: Every day | ORAL | Status: DC

## 2012-04-09 ENCOUNTER — Encounter: Payer: Self-pay | Admitting: Family Medicine

## 2012-04-09 ENCOUNTER — Ambulatory Visit: Payer: BC Managed Care – PPO

## 2012-04-09 ENCOUNTER — Ambulatory Visit (INDEPENDENT_AMBULATORY_CARE_PROVIDER_SITE_OTHER): Payer: BC Managed Care – PPO | Admitting: Family Medicine

## 2012-04-09 VITALS — BP 112/80 | HR 81 | Temp 97.7°F | Resp 16 | Ht 65.78 in | Wt 259.0 lb

## 2012-04-09 DIAGNOSIS — J189 Pneumonia, unspecified organism: Secondary | ICD-10-CM

## 2012-04-09 DIAGNOSIS — I10 Essential (primary) hypertension: Secondary | ICD-10-CM

## 2012-04-09 DIAGNOSIS — E7800 Pure hypercholesterolemia, unspecified: Secondary | ICD-10-CM

## 2012-04-09 DIAGNOSIS — B379 Candidiasis, unspecified: Secondary | ICD-10-CM | POA: Insufficient documentation

## 2012-04-09 DIAGNOSIS — E119 Type 2 diabetes mellitus without complications: Secondary | ICD-10-CM

## 2012-04-09 DIAGNOSIS — F419 Anxiety disorder, unspecified: Secondary | ICD-10-CM

## 2012-04-09 LAB — LIPID PANEL
Cholesterol: 247 mg/dL — ABNORMAL HIGH (ref 0–200)
HDL: 47 mg/dL (ref >39)
Total CHOL/HDL Ratio: 5.3 Ratio
Triglycerides: 240 mg/dL — ABNORMAL HIGH (ref <150)

## 2012-04-09 LAB — COMPREHENSIVE METABOLIC PANEL
BUN: 17 mg/dL (ref 6–23)
CO2: 27 mEq/L (ref 19–32)
Calcium: 9.3 mg/dL (ref 8.4–10.5)
Chloride: 103 mEq/L (ref 96–112)
Creat: 0.65 mg/dL (ref 0.50–1.10)
Glucose, Bld: 121 mg/dL — ABNORMAL HIGH (ref 70–99)

## 2012-04-09 LAB — CBC
HCT: 37.2 % (ref 36.0–46.0)
Hemoglobin: 12.9 g/dL (ref 12.0–15.0)
MCH: 29.8 pg (ref 26.0–34.0)
MCV: 85.9 fL (ref 78.0–100.0)
Platelets: 309 10*3/uL (ref 150–400)
RBC: 4.33 MIL/uL (ref 3.87–5.11)

## 2012-04-09 MED ORDER — KETOCONAZOLE 2 % EX CREA: TOPICAL_CREAM | Freq: Two times a day (BID) | CUTANEOUS | Status: DC

## 2012-04-09 NOTE — Assessment & Plan Note (Signed)
New.  Along abdominal fold; rx for Ketoconazole to apply bid.  Continue local wound care.

## 2012-04-09 NOTE — Assessment & Plan Note (Signed)
Controlled; obtain labs; continue current medications. 

## 2012-04-09 NOTE — Assessment & Plan Note (Signed)
Worsening due to multiple family stressors; coping well; continue Xanax PRN.  To contact office if declines and will restart Prozac.

## 2012-04-09 NOTE — Patient Instructions (Addendum)
1. Type II or unspecified type diabetes mellitus without mention of complication, not stated as uncontrolled  POCT glycosylated hemoglobin (Hb A1C)  2. Pure hypercholesterolemia  CBC, CK, Comprehensive metabolic panel, Lipid panel  3. Essential hypertension, benign  CBC, CK, Comprehensive metabolic panel  4. Pneumonia  DG Chest 2 View  5. Candidiasis

## 2012-04-09 NOTE — Assessment & Plan Note (Signed)
Uncontrolled; improved compliance with statin yet still persistent non-compliance; obtain labs; continue current medication.

## 2012-04-09 NOTE — Assessment & Plan Note (Signed)
Uncontrolled; improved HgbA1c from last visit; poor compliance with diet due to stressors; no change in management at this time; obtain labs.

## 2012-04-09 NOTE — Progress Notes (Signed)
[address]   [place], [place]  [address]   [phone]  Subjective:    Patient ID: [name], female    DOB: May 02, 1965, 47 y.o.   MRN: [id number]  HPIThis 47 y.o. female presents for evaluation of the following:  1.  DMII:  Three month follow-up. No changes to management since last visit but improved compliance with medication.   Remembering medication better.  Checking sugars more often and ranging 80-160.  Two low sugars since last visit.  Denies polyuria, polydipsia, significant changes in weight.  Has gained weight in past six months due to travel, stressors, daughter's hospitalization.  2.  HTN: three month follow-up; no changes to management made at last visit.  Reports compliance with medication; good tolerance; good symptom control. Denies CP/palp/SOB/leg swelling.  +HA this morning.  No dizziness/focal weakness/paresthesias.  3.  Hyperlipidemia:  Three month follow-up; poorly controlled at last visit.  Extremely elevated readings.  Forgets Lipitor a lot but taking more than last visit.  Good tolerance to medication; good symptom control.    4.  Stress:  Step granddaughter who is fourteen is pregnant.  Cried and yell a lot last week.  Using Xanax daily for past two weeks.  Helping both daughters a lot.  Youngest daughter recently underwent bowel resection due to uncontrolled Crohn's disease.  5.  Pneumonia:  Due for repeat CXR.  Breathing is better; still having some dull ache on R.  No cough.  No SOB.  Having hot sweats at night with pneumonia; still happening.  Not checking temperature with hot sweats.  No SOB; no wheezing; not using inhalers a lot.  6.  Skin fold irritation: needs cream.  Using powder.  Itching some.  Requesting Ketoconazole.   Review of Systems  Constitutional: Positive for diaphoresis. Negative for fever, chills and fatigue.  Respiratory: Negative for cough, chest tightness, shortness of breath and wheezing.   Cardiovascular: Negative for chest pain,  palpitations and leg swelling.  Gastrointestinal: Negative for nausea, vomiting, diarrhea and constipation.  Genitourinary: Negative for dysuria.  Skin: Positive for color change and rash.  Neurological: Negative for dizziness, tremors, seizures, syncope, facial asymmetry, speech difficulty, weakness, light-headedness, numbness and headaches.  Psychiatric/Behavioral: Positive for sleep disturbance and dysphoric mood. Negative for suicidal ideas and self-injury. The patient is nervous/anxious.         Past Medical History  Diagnosis Date  . Diabetes mellitus   . Hypertension   . Anemia   . Migraines   . Thyroid disease   . Colon polyp   . IBS (irritable bowel syndrome)   . Fatigue   . Night sweats   . Lump in female breast   . Asthma   . Sinus problem   . Reflux   . Methicillin resistant Staphylococcus aureus in conditions classified elsewhere and of unspecified site   . Paresthesias   . Migraine, unspecified, without mention of intractable migraine without mention of status migrainosus   . Unspecified acute reaction to stress   . Nonspecific abnormal results of thyroid function study   . Essential hypertension, benign   . Personal history of colonic polyps   . Pure hypercholesterolemia   . Type II or unspecified type diabetes mellitus without mention of complication, not stated as uncontrolled   . Morbid obesity   . Other B-complex deficiencies   . Edema   . Unspecified sleep apnea   . Symptomatic states associated with artificial menopause   . Allergy   . Arthritis  Past Surgical History  Procedure Date  . Cholecystectomy 03/1989  . Abdominal hysterectomy 1997    complete in 1997, partial was in 1995  . Knee surgery 2006  . Laparoscopic gastric banding 05/2009  . Cesarean section 1987  and 1989  . Abcess removal 1997    MRSA  . Tonsillectomy and adenoidectomy 1974  . Tubal ligation     Prior to Admission medications   Medication Sig Start Date End Date  Taking? Authorizing Provider  albuterol (PROVENTIL HFA;VENTOLIN HFA) 108 (90 BASE) MCG/ACT inhaler Inhale 2 puffs into the lungs every 6 (six) hours as needed for wheezing. 02/29/12  Yes Shade Flood, MD  ALPRAZolam Prudy Feeler) 0.5 MG tablet Take 1 tablet (0.5 mg total) by mouth daily. 03/06/12  Yes Ethelda Chick, MD  atorvastatin (LIPITOR) 20 MG tablet Take 1 tablet (20 mg total) by mouth daily. 04/02/12  Yes Heather M Marte, PA-C  azelastine (ASTELIN) 137 MCG/SPRAY nasal spray Place 1 spray into the nose 2 (two) times daily. Use in each nostril as directed   Yes Historical Provider, MD  beclomethasone (QVAR) 80 MCG/ACT inhaler Inhale 2 puffs into the lungs every 12 (twelve) hours as needed. 03/06/12  Yes Ethelda Chick, MD  CALCIUM PO Take by mouth 3 (three) times daily.     Yes Historical Provider, MD  Cetirizine HCl (ZYRTEC PO) Take by mouth daily.     Yes Historical Provider, MD  clarithromycin (BIAXIN) 500 MG tablet Take 1 tablet (500 mg total) by mouth 2 (two) times daily. 12/18/11  Yes Ethelda Chick, MD  cyclobenzaprine (FLEXERIL) 10 MG tablet Take 10 mg by mouth 3 (three) times daily as needed.   Yes Historical Provider, MD  Fexofenadine HCl (ALLEGRA PO) Take by mouth.     Yes Historical Provider, MD  fluconazole (DIFLUCAN) 150 MG tablet Take 1 tablet (150 mg total) by mouth once. Repeat if needed 02/13/12  Yes Heather M Marte, PA-C  fluticasone [facility]) 50 MCG/ACT nasal spray Place 2 sprays into the nose daily as needed.    Yes Historical Provider, MD  glipiZIDE-metformin (METAGLIP) 2.5-500 MG per tablet Take 2 tablets by mouth 2 (two) times daily before a meal. 03/17/12  Yes Ethelda Chick, MD  hydrochlorothiazide 25 MG tablet Take 25 mg by mouth daily.     Yes Historical Provider, MD  levalbuterol (XOPENEX) 1.25 MG/3ML nebulizer solution Take 1.25 mg by nebulization daily as needed.   Yes Historical Provider, MD  losartan (COZAAR) 100 MG tablet Take 100 mg by mouth daily.     Yes  Historical Provider, MD  Multiple Vitamin (MULTIVITAMIN PO) Take by mouth daily.     Yes Historical Provider, MD  ondansetron (ZOFRAN ODT) 4 MG disintegrating tablet Take 1 tablet (4 mg total) by mouth every 8 (eight) hours as needed for nausea. 02/29/12  Yes Shade Flood, MD  azithromycin (ZITHROMAX) 500 MG tablet Take 1 tablet (500 mg total) by mouth daily. 02/29/12   Shade Flood, MD  ketoconazole (NIZORAL) 2 % cream Apply topically 2 (two) times daily. 04/09/12   Ethelda Chick, MD  NEOMYCIN-POLYMYXIN-HC, OTIC, (CORTISPORIN) 1 % SOLN Place 4 drops into the left ear every 6 (six) hours. 10/16/11   Gwenlyn Found Copland, MD  nitrofurantoin, macrocrystal-monohydrate, (MACROBID) 100 MG capsule Take 1 capsule (100 mg total) by mouth 2 (two) times daily. 02/07/12   Heather Jaquita Rector, PA-C  phenazopyridine (PYRIDIUM) 200 MG tablet Take 1 tablet (200 mg total) by mouth  3 (three) times daily as needed for pain. 02/07/12   Heather Jaquita Rector, PA-C  predniSONE (DELTASONE) 20 MG tablet Take 2 tablets (40 mg total) by mouth daily. 02/29/12   Shade Flood, MD  sulfamethoxazole-trimethoprim (SEPTRA DS) 800-160 MG per tablet Take 1 tablet by mouth 2 (two) times daily. 02/12/12   Nelva Nay, PA-C    Allergies  Allergen Reactions  . Amoxicillin Shortness Of Breath and Rash  . Levofloxacin Shortness Of Breath and Rash  . Vancomycin Anaphylaxis  . Ace Inhibitors Cough  . Codeine   . Meperidine Hcl   . Oxycodone-Acetaminophen   . Oxycodone-Acetaminophen   . Penicillins Swelling and Rash  . Tequin (Gatifloxacin) Rash    History   Social History  . Marital Status: Married    Spouse Name: N/A    Number of Children: 3  . Years of Education: N/A   Occupational History  . 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 Smoking status: Never Smoker   . Smokeless tobacco: Not on file  . Alcohol Use: 0.0 oz/week    0 drink(s) per week     Comment: rarely - once per week at most  . Drug Use:  No  . Sexually Active: No   Other Topics Concern  . Not on file   Social History Narrative   Exercise: walking three days per week, 30 minutes. Married x 26 years, happily married no abuse.    Family History  Problem Relation Age of Onset  . Diabetes Mother   . Heart disease Mother   . Other Mother     muscle disease  . Hypertension Mother   . Hyperlipidemia Mother   . Diabetes Father   . Heart disease Father   . Stroke Father   . Diabetes Brother   . Heart disease Brother   . Breast cancer      2 Aunts  . Fibromyalgia Daughter   . Crohn's disease Daughter   . Hypertension Son   . Alzheimer's disease Maternal Grandmother   . Emphysema Maternal Grandfather   . Heart disease Paternal Grandmother     Objective:   Physical Exam  Nursing note and vitals reviewed. Constitutional: She is oriented to person, place, and time. She appears well-developed and well-nourished. No distress.  HENT:  Head: Normocephalic and atraumatic.  Eyes: Conjunctivae normal and EOM are normal. Pupils are equal, round, and reactive to light.  Neck: Normal range of motion. Neck supple. No JVD present. No thyromegaly present.  Cardiovascular: Normal rate and regular rhythm.  Exam reveals no gallop and no friction rub.   Murmur heard.  Systolic murmur is present with a grade of 2/6  Pulmonary/Chest: Effort normal and breath sounds normal. No respiratory distress. She has no wheezes. She has no rales.  Abdominal: There is no tenderness. There is no rebound and no guarding.  Lymphadenopathy:    She has no cervical adenopathy.  Neurological: She is alert and oriented to person, place, and time. No cranial nerve deficit. She exhibits normal muscle tone. Coordination normal.  Skin: Rash noted. She is not diaphoretic. There is erythema.       Lower abdominal skin fold with scaling erythematous rash diffusely lower abdomen B regions with slight bleeding medially.  Defined border to rash with scaling.    Psychiatric: She has a normal mood and affect. Her behavior is normal. Judgment and thought content normal.      [facility] reading (PRIMARY) by  [name].  CXR: NAD; resolution of R lobe infiltrate at fissure.  Results for orders placed in visit on 04/09/12  POCT GLYCOSYLATED HEMOGLOBIN (HGB A1C)      Component Value Range   Hemoglobin A1C 7.9        Assessment & Plan:

## 2012-04-09 NOTE — Assessment & Plan Note (Signed)
Improved; CXR improved/normal.  Continues to have R side pain and night time sweats.  To check temperature at night with sweats.  If fever and/or persistent chest wall pain, obtain CT chest to rule out further pathology; pt expresses understanding.

## 2012-04-22 ENCOUNTER — Encounter: Payer: Self-pay | Admitting: Family Medicine

## 2012-04-24 ENCOUNTER — Other Ambulatory Visit: Payer: Self-pay | Admitting: Family Medicine

## 2012-04-24 DIAGNOSIS — Z1239 Encounter for other screening for malignant neoplasm of breast: Secondary | ICD-10-CM

## 2012-04-24 DIAGNOSIS — Z1231 Encounter for screening mammogram for malignant neoplasm of breast: Secondary | ICD-10-CM

## 2012-05-20 ENCOUNTER — Ambulatory Visit
Admission: RE | Admit: 2012-05-20 | Discharge: 2012-05-20 | Disposition: A | Discharge status: Home / Self Care | Payer: BC Managed Care – PPO | Source: Ambulatory Visit | Attending: Family Medicine | Admitting: Family Medicine

## 2012-05-21 ENCOUNTER — Encounter: Payer: Self-pay | Admitting: Family Medicine

## 2012-05-21 ENCOUNTER — Other Ambulatory Visit: Payer: Self-pay | Admitting: Family Medicine

## 2012-05-23 ENCOUNTER — Telehealth: Payer: Self-pay | Admitting: Radiology

## 2012-05-23 ENCOUNTER — Other Ambulatory Visit: Payer: Self-pay | Admitting: Radiology

## 2012-05-23 NOTE — Telephone Encounter (Signed)
Orders generated for patient to have left breast US and Diagnostic mammogram. She is being called today. To you FYI

## 2012-05-27 ENCOUNTER — Ambulatory Visit
Admission: RE | Admit: 2012-05-27 | Discharge: 2012-05-27 | Disposition: A | Discharge status: Home / Self Care | Payer: BC Managed Care – PPO | Source: Ambulatory Visit | Attending: Family Medicine | Admitting: Family Medicine

## 2012-05-27 ENCOUNTER — Other Ambulatory Visit: Payer: Self-pay | Admitting: Family Medicine

## 2012-05-27 ENCOUNTER — Other Ambulatory Visit: Payer: BC Managed Care – PPO

## 2012-06-03 NOTE — Telephone Encounter (Signed)
Noted  

## 2012-06-06 ENCOUNTER — Encounter: Payer: Self-pay | Admitting: Family Medicine

## 2012-06-07 ENCOUNTER — Other Ambulatory Visit: Payer: Self-pay

## 2012-06-07 DIAGNOSIS — R928 Other abnormal and inconclusive findings on diagnostic imaging of breast: Secondary | ICD-10-CM

## 2012-06-10 ENCOUNTER — Ambulatory Visit
Admission: RE | Admit: 2012-06-10 | Discharge: 2012-06-10 | Disposition: A | Discharge status: Home / Self Care | Payer: BC Managed Care – PPO | Source: Ambulatory Visit | Attending: Family Medicine | Admitting: Family Medicine

## 2012-06-10 ENCOUNTER — Other Ambulatory Visit: Payer: BC Managed Care – PPO

## 2012-06-10 ENCOUNTER — Other Ambulatory Visit: Payer: Self-pay | Admitting: Family Medicine

## 2012-06-10 DIAGNOSIS — R928 Other abnormal and inconclusive findings on diagnostic imaging of breast: Secondary | ICD-10-CM

## 2012-07-11 ENCOUNTER — Other Ambulatory Visit: Payer: Self-pay | Admitting: Family Medicine

## 2012-07-16 ENCOUNTER — Ambulatory Visit: Payer: BC Managed Care – PPO | Admitting: Family Medicine

## 2012-07-24 ENCOUNTER — Encounter: Payer: Self-pay | Admitting: Family Medicine

## 2012-07-24 ENCOUNTER — Ambulatory Visit (INDEPENDENT_AMBULATORY_CARE_PROVIDER_SITE_OTHER): Payer: BC Managed Care – PPO | Admitting: Family Medicine

## 2012-07-24 VITALS — BP 128/72 | HR 94 | Temp 98.3°F | Resp 18 | Wt 271.0 lb

## 2012-07-24 DIAGNOSIS — T39094A Poisoning by salicylates, undetermined, initial encounter: Secondary | ICD-10-CM

## 2012-07-24 DIAGNOSIS — E119 Type 2 diabetes mellitus without complications: Secondary | ICD-10-CM

## 2012-07-24 DIAGNOSIS — B372 Candidiasis of skin and nail: Secondary | ICD-10-CM

## 2012-07-24 DIAGNOSIS — E7800 Pure hypercholesterolemia, unspecified: Secondary | ICD-10-CM

## 2012-07-24 DIAGNOSIS — I10 Essential (primary) hypertension: Secondary | ICD-10-CM

## 2012-07-24 DIAGNOSIS — S46912S Strain of unspecified muscle, fascia and tendon at shoulder and upper arm level, left arm, sequela: Secondary | ICD-10-CM

## 2012-07-24 LAB — CBC
HCT: 41.6 % (ref 36.0–46.0)
MCV: 84.4 fL (ref 78.0–100.0)
Platelets: 309 10*3/uL (ref 150–400)
RBC: 4.93 MIL/uL (ref 3.87–5.11)
RDW: 13.1 % (ref 11.5–15.5)
WBC: 6.6 10*3/uL (ref 4.0–10.5)

## 2012-07-24 MED ORDER — NYSTATIN 100000 UNIT/GM EX POWD: Freq: Four times a day (QID) | CUTANEOUS | Status: DC

## 2012-07-24 MED ORDER — NYSTATIN-TRIAMCINOLONE 100000-0.1 UNIT/GM-% EX CREA: TOPICAL_CREAM | Freq: Two times a day (BID) | CUTANEOUS | Status: DC

## 2012-07-24 MED ORDER — CYCLOBENZAPRINE HCL 5 MG PO TABS: ORAL_TABLET | ORAL | Status: DC

## 2012-07-24 NOTE — Patient Instructions (Addendum)
Impingement Syndrome, Rotator Cuff, Bursitis with Rehab Impingement syndrome is a condition that involves inflammation of the tendons of the rotator cuff and the subacromial bursa, that causes pain in the shoulder. The rotator cuff consists of four tendons and muscles that control much of the shoulder and upper arm function. The subacromial bursa is a fluid filled sac that helps reduce friction between the rotator cuff and one of the bones of the shoulder (acromion). Impingement syndrome is usually an overuse injury that causes swelling of the bursa (bursitis), swelling of the tendon (tendonitis), and/or a tear of the tendon (strain). Strains are classified into three categories. Grade 1 strains cause pain, but the tendon is not lengthened. Grade 2 strains include a lengthened ligament, due to the ligament being stretched or partially ruptured. With grade 2 strains there is still function, although the function may be decreased. Grade 3 strains include a complete tear of the tendon or muscle, and function is usually impaired. SYMPTOMS   Pain around the shoulder, often at the outer portion of the upper arm.  Pain that gets worse with shoulder function, especially when reaching overhead or lifting.  Sometimes, aching when not using the arm.  Pain that wakes you up at night.  Sometimes, tenderness, swelling, warmth, or redness over the affected area.  Loss of strength.  Limited motion of the shoulder, especially reaching behind the back (to the back pocket or to unhook bra) or across your body.  Crackling sound (crepitation) when moving the arm.  Biceps tendon pain and inflammation (in the front of the shoulder). Worse when bending the elbow or lifting. CAUSES  Impingement syndrome is often an overuse injury, in which chronic (repetitive) motions cause the tendons or bursa to become inflamed. A strain occurs when a force is paced on the tendon or muscle that is greater than it can withstand.  Common mechanisms of injury include: Stress from sudden increase in duration, frequency, or intensity of training.  Direct hit (trauma) to the shoulder.  Aging, erosion of the tendon with normal use.  Bony bump on shoulder (acromial spur). RISK INCREASES WITH:  Contact sports (football, wrestling, boxing).  Throwing sports (baseball, tennis, volleyball).  Weightlifting and bodybuilding.  Heavy labor.  Previous injury to the rotator cuff, including impingement.  Poor shoulder strength and flexibility.  Failure to warm up properly before activity.  Inadequate protective equipment.  Old age.  Bony bump on shoulder (acromial spur). PREVENTION   Warm up and stretch properly before activity.  Allow for adequate recovery between workouts.  Maintain physical fitness:  Strength, flexibility, and endurance.  Cardiovascular fitness.  Learn and use proper exercise technique. PROGNOSIS  If treated properly, impingement syndrome usually goes away within 6 weeks. Sometimes surgery is required.  RELATED COMPLICATIONS   Longer healing time if not properly treated, or if not given enough time to heal.  Recurring symptoms, that result in a chronic condition.  Shoulder stiffness, frozen shoulder, or loss of motion.  Rotator cuff tendon tear.  Recurring symptoms, especially if activity is resumed too soon, with overuse, with a direct blow, or when using poor technique. TREATMENT  Treatment first involves the use of ice and medicine, to reduce pain and inflammation. The use of strengthening and stretching exercises may help reduce pain with activity. These exercises may be performed at home or with a therapist. If non-surgical treatment is unsuccessful after more than 6 months, surgery may be advised. After surgery and rehabilitation, activity is usually possible in 3 months.    MEDICATION  If pain medicine is needed, nonsteroidal anti-inflammatory medicines (aspirin and  ibuprofen), or other minor pain relievers (acetaminophen), are often advised.  Do not take pain medicine for 7 days before surgery.  Prescription pain relievers may be given, if your caregiver thinks they are needed. Use only as directed and only as much as you need.  Corticosteroid injections may be given by your caregiver. These injections should be reserved for the most serious cases, because they may only be given a certain number of times. HEAT AND COLD  Cold treatment (icing) should be applied for 10 to 15 minutes every 2 to 3 hours for inflammation and pain, and immediately after activity that aggravates your symptoms. Use ice packs or an ice massage.  Heat treatment may be used before performing stretching and strengthening activities prescribed by your caregiver, physical therapist, or athletic trainer. Use a heat pack or a warm water soak. SEEK MEDICAL CARE IF:   Symptoms get worse or do not improve in 4 to 6 weeks, despite treatment.  New, unexplained symptoms develop. (Drugs used in treatment may produce side effects.) EXERCISES  RANGE OF MOTION (ROM) AND STRETCHING EXERCISES - Impingement Syndrome (Rotator Cuff  Tendinitis, Bursitis) These exercises may help you when beginning to rehabilitate your injury. Your symptoms may go away with or without further involvement from your physician, physical therapist or athletic trainer. While completing these exercises, remember:   Restoring tissue flexibility helps normal motion to return to the joints. This allows healthier, less painful movement and activity.  An effective stretch should be held for at least 30 seconds.  A stretch should never be painful. You should only feel a gentle lengthening or release in the stretched tissue. STRETCH  Flexion, Standing  Stand with good posture. With an underhand grip on your right / left hand, and an overhand grip on the opposite hand, grasp a broomstick or cane so that your hands are a little  more than shoulder width apart.  Keeping your right / left elbow straight and shoulder muscles relaxed, push the stick with your opposite hand, to raise your right / left arm in front of your body and then overhead. Raise your arm until you feel a stretch in your right / left shoulder, but before you have increased shoulder pain.  Try to avoid shrugging your right / left shoulder as your arm rises, by keeping your shoulder blade tucked down and toward your mid-back spine. Hold for __________ seconds.  Slowly return to the starting position. Repeat __________ times. Complete this exercise __________ times per day. STRETCH  Abduction, Supine  Lie on your back. With an underhand grip on your right / left hand and an overhand grip on the opposite hand, grasp a broomstick or cane so that your hands are a little more than shoulder width apart.  Keeping your right / left elbow straight and your shoulder muscles relaxed, push the stick with your opposite hand, to raise your right / left arm out to the side of your body and then overhead. Raise your arm until you feel a stretch in your right / left shoulder, but before you have increased shoulder pain.  Try to avoid shrugging your right / left shoulder as your arm rises, by keeping your shoulder blade tucked down and toward your mid-back spine. Hold for __________ seconds.  Slowly return to the starting position. Repeat __________ times. Complete this exercise __________ times per day. ROM  Flexion, Active-Assisted  Lie on your back.   You may bend your knees for comfort.  Grasp a broomstick or cane so your hands are about shoulder width apart. Your right / left hand should grip the end of the stick, so that your hand is positioned "thumbs-up," as if you were about to shake hands.  Using your healthy arm to lead, raise your right / left arm overhead, until you feel a gentle stretch in your shoulder. Hold for __________ seconds.  Use the stick to  assist in returning your right / left arm to its starting position. Repeat __________ times. Complete this exercise __________ times per day.  ROM - Internal Rotation, Supine   Lie on your back on a firm surface. Place your right / left elbow about 60 degrees away from your side. Elevate your elbow with a folded towel, so that the elbow and shoulder are the same height.  Using a broomstick or cane and your strong arm, pull your right / left hand toward your body until you feel a gentle stretch, but no increase in your shoulder pain. Keep your shoulder and elbow in place throughout the exercise.  Hold for __________ seconds. Slowly return to the starting position. Repeat __________ times. Complete this exercise __________ times per day. STRETCH - Internal Rotation  Place your right / left hand behind your back, palm up.  Throw a towel or belt over your opposite shoulder. Grasp the towel with your right / left hand.  While keeping an upright posture, gently pull up on the towel, until you feel a stretch in the front of your right / left shoulder.  Avoid shrugging your right / left shoulder as your arm rises, by keeping your shoulder blade tucked down and toward your mid-back spine.  Hold for __________ seconds. Release the stretch, by lowering your healthy hand. Repeat __________ times. Complete this exercise __________ times per day. ROM - Internal Rotation   Using an underhand grip, grasp a stick behind your back with both hands.  While standing upright with good posture, slide the stick up your back until you feel a mild stretch in the front of your shoulder.  Hold for __________ seconds. Slowly return to your starting position. Repeat __________ times. Complete this exercise __________ times per day.  STRETCH  Posterior Shoulder Capsule   Stand or sit with good posture. Grasp your right / left elbow and draw it across your chest, keeping it at the same height as your  shoulder.  Pull your elbow, so your upper arm comes in closer to your chest. Pull until you feel a gentle stretch in the back of your shoulder.  Hold for __________ seconds. Repeat __________ times. Complete this exercise __________ times per day. STRENGTHENING EXERCISES - Impingement Syndrome (Rotator Cuff Tendinitis, Bursitis) These exercises may help you when beginning to rehabilitate your injury. They may resolve your symptoms with or without further involvement from your physician, physical therapist or athletic trainer. While completing these exercises, remember:  Muscles can gain both the endurance and the strength needed for everyday activities through controlled exercises.  Complete these exercises as instructed by your physician, physical therapist or athletic trainer. Increase the resistance and repetitions only as guided.  You may experience muscle soreness or fatigue, but the pain or discomfort you are trying to eliminate should never worsen during these exercises. If this pain does get worse, stop and make sure you are following the directions exactly. If the pain is still present after adjustments, discontinue the exercise until you can discuss   the trouble with your clinician.  During your recovery, avoid activity or exercises which involve actions that place your injured hand or elbow above your head or behind your back or head. These positions stress the tissues which you are trying to heal. STRENGTH - Scapular Depression and Adduction   With good posture, sit on a firm chair. Support your arms in front of you, with pillows, arm rests, or on a table top. Have your elbows in line with the sides of your body.  Gently draw your shoulder blades down and toward your mid-back spine. Gradually increase the tension, without tensing the muscles along the top of your shoulders and the back of your neck.  Hold for __________ seconds. Slowly release the tension and relax your muscles  completely before starting the next repetition.  After you have practiced this exercise, remove the arm support and complete the exercise in standing as well as sitting position. Repeat __________ times. Complete this exercise __________ times per day.  STRENGTH - Shoulder Abductors, Isometric  With good posture, stand or sit about 4-6 inches from a wall, with your right / left side facing the wall.  Bend your right / left elbow. Gently press your right / left elbow into the wall. Increase the pressure gradually, until you are pressing as hard as you can, without shrugging your shoulder or increasing any shoulder discomfort.  Hold for __________ seconds.  Release the tension slowly. Relax your shoulder muscles completely before you begin the next repetition. Repeat __________ times. Complete this exercise __________ times per day.  STRENGTH - External Rotators, Isometric  Keep your right / left elbow at your side and bend it 90 degrees.  Step into a door frame so that the outside of your right / left wrist can press against the door frame without your upper arm leaving your side.  Gently press your right / left wrist into the door frame, as if you were trying to swing the back of your hand away from your stomach. Gradually increase the tension, until you are pressing as hard as you can, without shrugging your shoulder or increasing any shoulder discomfort.  Hold for __________ seconds.  Release the tension slowly. Relax your shoulder muscles completely before you begin the next repetition. Repeat __________ times. Complete this exercise __________ times per day.  STRENGTH - Supraspinatus   Stand or sit with good posture. Grasp a __________ weight, or an exercise band or tubing, so that your hand is "thumbs-up," like you are shaking hands.  Slowly lift your right / left arm in a "V" away from your thigh, diagonally into the space between your side and straight ahead. Lift your hand to  shoulder height or as far as you can, without increasing any shoulder pain. At first, many people do not lift their hands above shoulder height.  Avoid shrugging your right / left shoulder as your arm rises, by keeping your shoulder blade tucked down and toward your mid-back spine.  Hold for __________ seconds. Control the descent of your hand, as you slowly return to your starting position. Repeat __________ times. Complete this exercise __________ times per day.  STRENGTH - External Rotators  Secure a rubber exercise band or tubing to a fixed object (table, pole) so that it is at the same height as your right / left elbow when you are standing or sitting on a firm surface.  Stand or sit so that the secured exercise band is at your uninjured side.  Bend   your right / left elbow 90 degrees. Place a folded towel or small pillow under your right / left arm, so that your elbow is a few inches away from your side.  Keeping the tension on the exercise band, pull it away from your body, as if pivoting on your elbow. Be sure to keep your body steady, so that the movement is coming only from your rotating shoulder.  Hold for __________ seconds. Release the tension in a controlled manner, as you return to the starting position. Repeat __________ times. Complete this exercise __________ times per day.  STRENGTH - Internal Rotators   Secure a rubber exercise band or tubing to a fixed object (table, pole) so that it is at the same height as your right / left elbow when you are standing or sitting on a firm surface.  Stand or sit so that the secured exercise band is at your right / left side.  Bend your elbow 90 degrees. Place a folded towel or small pillow under your right / left arm so that your elbow is a few inches away from your side.  Keeping the tension on the exercise band, pull it across your body, toward your stomach. Be sure to keep your body steady, so that the movement is coming only from  your rotating shoulder.  Hold for __________ seconds. Release the tension in a controlled manner, as you return to the starting position. Repeat __________ times. Complete this exercise __________ times per day.  STRENGTH - Scapular Protractors, Standing   Stand arms length away from a wall. Place your hands on the wall, keeping your elbows straight.  Begin by dropping your shoulder blades down and toward your mid-back spine.  To strengthen your protractors, keep your shoulder blades down, but slide them forward on your rib cage. It will feel as if you are lifting the back of your rib cage away from the wall. This is a subtle motion and can be challenging to complete. Ask your caregiver for further instruction, if you are not sure you are doing the exercise correctly.  Hold for __________ seconds. Slowly return to the starting position, resting the muscles completely before starting the next repetition. Repeat __________ times. Complete this exercise __________ times per day. STRENGTH - Scapular Protractors, Supine  Lie on your back on a firm surface. Extend your right / left arm straight into the air while holding a __________ weight in your hand.  Keeping your head and back in place, lift your shoulder off the floor.  Hold for __________ seconds. Slowly return to the starting position, and allow your muscles to relax completely before starting the next repetition. Repeat __________ times. Complete this exercise __________ times per day. STRENGTH - Scapular Protractors, Quadruped  Get onto your hands and knees, with your shoulders directly over your hands (or as close as you can be, comfortably).  Keeping your elbows locked, lift the back of your rib cage up into your shoulder blades, so your mid-back rounds out. Keep your neck muscles relaxed.  Hold this position for __________ seconds. Slowly return to the starting position and allow your muscles to relax completely before starting the  next repetition. Repeat __________ times. Complete this exercise __________ times per day.  STRENGTH - Scapular Retractors  Secure a rubber exercise band or tubing to a fixed object (table, pole), so that it is at the height of your shoulders when you are either standing, or sitting on a firm armless chair.  With a   palm down grip, grasp an end of the band in each hand. Straighten your elbows and lift your hands straight in front of you, at shoulder height. Step back, away from the secured end of the band, until it becomes tense.  Squeezing your shoulder blades together, draw your elbows back toward your sides, as you bend them. Keep your upper arms lifted away from your body throughout the exercise.  Hold for __________ seconds. Slowly ease the tension on the band, as you reverse the directions and return to the starting position. Repeat __________ times. Complete this exercise __________ times per day. STRENGTH - Shoulder Extensors   Secure a rubber exercise band or tubing to a fixed object (table, pole) so that it is at the height of your shoulders when you are either standing, or sitting on a firm armless chair.  With a thumbs-up grip, grasp an end of the band in each hand. Straighten your elbows and lift your hands straight in front of you, at shoulder height. Step back, away from the secured end of the band, until it becomes tense.  Squeezing your shoulder blades together, pull your hands down to the sides of your thighs. Do not allow your hands to go behind you.  Hold for __________ seconds. Slowly ease the tension on the band, as you reverse the directions and return to the starting position. Repeat __________ times. Complete this exercise __________ times per day.  STRENGTH - Scapular Retractors and External Rotators   Secure a rubber exercise band or tubing to a fixed object (table, pole) so that it is at the height as your shoulders, when you are either standing, or sitting on a  firm armless chair.  With a palm down grip, grasp an end of the band in each hand. Bend your elbows 90 degrees and lift your elbows to shoulder height, at your sides. Step back, away from the secured end of the band, until it becomes tense.  Squeezing your shoulder blades together, rotate your shoulders so that your upper arms and elbows remain stationary, but your fists travel upward to head height.  Hold for __________ seconds. Slowly ease the tension on the band, as you reverse the directions and return to the starting position. Repeat __________ times. Complete this exercise __________ times per day.  STRENGTH - Scapular Retractors and External Rotators, Rowing   Secure a rubber exercise band or tubing to a fixed object (table, pole) so that it is at the height of your shoulders, when you are either standing, or sitting on a firm armless chair.  With a palm down grip, grasp an end of the band in each hand. Straighten your elbows and lift your hands straight in front of you, at shoulder height. Step back, away from the secured end of the band, until it becomes tense.  Step 1: Squeeze your shoulder blades together. Bending your elbows, draw your hands to your chest, as if you are rowing a boat. At the end of this motion, your hands and elbow should be at shoulder height and your elbows should be out to your sides.  Step 2: Rotate your shoulders, to raise your hands above your head. Your forearms should be vertical and your upper arms should be horizontal.  Hold for __________ seconds. Slowly ease the tension on the band, as you reverse the directions and return to the starting position. Repeat __________ times. Complete this exercise __________ times per day.  STRENGTH  Scapular Depressors  Find a sturdy chair   without wheels, such as a dining room chair.  Keeping your feet on the floor, and your hands on the chair arms, lift your bottom up from the seat, and lock your elbows.  Keeping  your elbows straight, allow gravity to pull your body weight down. Your shoulders will rise toward your ears.  Raise your body against gravity by drawing your shoulder blades down your back, shortening the distance between your shoulders and ears. Although your feet should always maintain contact with the floor, your feet should progressively support less body weight, as you get stronger.  Hold for __________ seconds. In a controlled and slow manner, lower your body weight to begin the next repetition. Repeat __________ times. Complete this exercise __________ times per day.  Document Released: 03/06/2005 Document Revised: 05/29/2011 Document Reviewed: 06/18/2008 [employer] Patient Information 2013 [employer]  

## 2012-07-24 NOTE — Progress Notes (Signed)
[address]   [place], [place]  [address]   [phone]  Subjective:    Patient ID: [name], female    DOB: 1965-05-29, 47 y.o.   MRN: [id number]  HPI This 47 y.o. female presents for four month follow-up:  1.  DMII:  Not checking sugars.  Taking medication better.  Denies polyuria, polydipsia, drastic weight changes.  2.  HTN:  Not checking blood pressure at home.  Reports compliance with medications.  3.  Hyperlipidemia:  Taking statin almost every day.  Taking every other most of the time.  Reports good tolerance to medication; good symptom control.  4.  L breast mass:  S/p breast biospy; negative. Biopsy six weeks ago.  No redness.    5.  L shoulder pain:  Persistent pain of L shoulder; pain intermittent; will hurts at night; will occur for one week and then resolves.  Some days, pain will really hurt during the day.  No worsening/triggering events.  No longer lifting grandchild; quit lifting granddaughter three months ago without improvement.  L deltoid region; can feel knot at times.    Can massage knot with improvement.  Constant pain.  Taking Ibuprofen and Tylenol.  Advil is upsetting stomach.  Tender to touch all the time.  No n/t.  Neck stiffness but going to chiropractor.  No exercises prescribed at last visit. Ortho is [facility].   6.  Inguinal folds: staying irritated; applying Ketoconazole cream; also applying powder.  7.  Stress:   Coping with family stressors relatively well.  Youngest daughter is doing better with Crohn's disease; scheduled to leave in August for further training. Continues to help oldest daughter out a lot with family.       Review of Systems  Constitutional: Negative for fever, chills, diaphoresis and fatigue.  Respiratory: Negative for shortness of breath, wheezing and stridor.   Cardiovascular: Negative for chest pain, palpitations and leg swelling.  Gastrointestinal: Negative for nausea, vomiting and abdominal pain.  Endocrine: Negative  for cold intolerance, heat intolerance, polydipsia, polyphagia and polyuria.  Musculoskeletal: Positive for arthralgias. Negative for back pain and joint swelling.  Skin: Positive for rash. Negative for color change, pallor and wound.  Neurological: Negative for dizziness, tremors, seizures, syncope, facial asymmetry, speech difficulty, weakness, light-headedness, numbness and headaches.  Psychiatric/Behavioral: Negative for suicidal ideas, sleep disturbance, self-injury and dysphoric mood. The patient is nervous/anxious.     Past Medical History  Diagnosis Date  . Diabetes mellitus   . Hypertension   . Anemia   . Migraines   . Thyroid disease   . Colon polyp   . IBS (irritable bowel syndrome)   . Fatigue   . Night sweats   . Lump in female breast   . Asthma   . Sinus problem   . Reflux   . Methicillin resistant Staphylococcus aureus in conditions classified elsewhere and of unspecified site   . Paresthesias   . Migraine, unspecified, without mention of intractable migraine without mention of status migrainosus   . Unspecified acute reaction to stress   . Nonspecific abnormal results of thyroid function study   . Essential hypertension, benign   . Personal history of colonic polyps   . Pure hypercholesterolemia   . Type II or unspecified type diabetes mellitus without mention of complication, not stated as uncontrolled   . Morbid obesity   . Other B-complex deficiencies   . Edema   . Unspecified sleep apnea   . Symptomatic states associated with artificial  menopause   . Allergy   . Arthritis     Past Surgical History  Procedure Laterality Date  . Cholecystectomy  03/1989  . Abdominal hysterectomy  1997    complete in 1997, partial was in 1995  . Knee surgery  2006  . Laparoscopic gastric banding  05/2009  . Cesarean section  1987  and 1989  . Abcess removal  1997    MRSA  . Tonsillectomy and adenoidectomy  1974  . Tubal ligation      Prior to Admission medications    Medication Sig Start Date End Date Taking? Authorizing Provider  albuterol (PROVENTIL HFA;VENTOLIN HFA) 108 (90 BASE) MCG/ACT inhaler Inhale 2 puffs into the lungs every 6 (six) hours as needed for wheezing. 02/29/12  Yes Shade Flood, MD  atorvastatin (LIPITOR) 20 MG tablet Take 1 tablet (20 mg total) by mouth daily. 04/02/12  Yes Heather M Marte, PA-C  azelastine (ASTELIN) 137 MCG/SPRAY nasal spray Place 1 spray into the nose 2 (two) times daily. Use in each nostril as directed   Yes Historical Provider, MD  beclomethasone (QVAR) 80 MCG/ACT inhaler Inhale 2 puffs into the lungs every 12 (twelve) hours as needed. 03/06/12  Yes Ethelda Chick, MD  CALCIUM PO Take by mouth 3 (three) times daily.     Yes Historical Provider, MD  Cetirizine HCl (ZYRTEC PO) Take by mouth daily.     Yes Historical Provider, MD  Fexofenadine HCl (ALLEGRA PO) Take by mouth.     Yes Historical Provider, MD  fluticasone (FLONASE) 50 MCG/ACT nasal spray Place 2 sprays into the nose daily as needed.    Yes Historical Provider, MD  glipiZIDE-metformin (METAGLIP) 2.5-500 MG per tablet TAKE TWO (2) TABLETS BY MOUTH 2 TIMES DAILY BEFORE A MEAL 07/11/12  Yes Ryan M Dunn, PA-C  hydrochlorothiazide 25 MG tablet Take 25 mg by mouth daily.     Yes Historical Provider, MD  ketoconazole (NIZORAL) 2 % cream Apply topically 2 (two) times daily. 04/09/12  Yes Ethelda Chick, MD  levalbuterol Pauline Aus) 1.25 MG/3ML nebulizer solution Take 1.25 mg by nebulization daily as needed.   Yes Historical Provider, MD  losartan (COZAAR) 100 MG tablet Take 100 mg by mouth daily.     Yes Historical Provider, MD  Multiple Vitamin (MULTIVITAMIN PO) Take by mouth daily.     Yes Historical Provider, MD  ondansetron (ZOFRAN ODT) 4 MG disintegrating tablet Take 1 tablet (4 mg total) by mouth every 8 (eight) hours as needed for nausea. 02/29/12  Yes Shade Flood, MD  ALPRAZolam Prudy Feeler) 0.5 MG tablet Take 1 tablet (0.5 mg total) by mouth daily as needed  for sleep. 09/06/12   Ethelda Chick, MD  cyclobenzaprine (FLEXERIL) 5 MG tablet One at bedtime for shoulder pain 07/24/12   Ethelda Chick, MD  doxycycline (VIBRA-TABS) 100 MG tablet Take 1 tablet (100 mg total) by mouth 2 (two) times daily. 07/29/12   Peyton Najjar, MD  fluconazole (DIFLUCAN) 150 MG tablet Take 1 tablet (150 mg total) by mouth once. Repeat if needed 08/02/12   Chelle S Jeffery, PA-C  nystatin (MYCOSTATIN) powder Apply topically 4 (four) times daily. 07/24/12   Ethelda Chick, MD  nystatin-triamcinolone (MYCOLOG II) cream Apply topically 2 (two) times daily. 07/24/12   Ethelda Chick, MD    Allergies  Allergen Reactions  . Amoxicillin Shortness Of Breath and Rash  . Levofloxacin Shortness Of Breath and Rash  . Vancomycin Anaphylaxis  . Ace  Inhibitors Cough  . Codeine   . Meperidine Hcl   . Oxycodone-Acetaminophen   . Oxycodone-Acetaminophen   . Penicillins Swelling and Rash  . Tequin (Gatifloxacin) Rash    History   Social History  . Marital Status: Married    Spouse Name: N/A    Number of Children: 3  . Years of Education: N/A   Occupational History  . 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 Smoking status: Never Smoker   . Smokeless tobacco: Not on file  . Alcohol Use: 0.0 oz/week    0 drink(s) per week     Comment: rarely - once per week at most  . Drug Use: No  . Sexually Active: No   Other Topics Concern  . Not on file   Social History Narrative   Exercise: walking three days per week, 30 minutes. Married x 26 years, happily married no abuse.    Family History  Problem Relation Age of Onset  . Diabetes Mother   . Heart disease Mother   . Other Mother     muscle disease  . Hypertension Mother   . Hyperlipidemia Mother   . Diabetes Father   . Heart disease Father   . Stroke Father   . Diabetes Brother   . Heart disease Brother   . Breast cancer      2 Aunts  . Fibromyalgia Daughter   . Crohn's disease Daughter   . Hypertension  Son   . Alzheimer's disease Maternal Grandmother   . Emphysema Maternal Grandfather   . Heart disease Paternal Grandmother        Objective:   Physical Exam  Nursing note and vitals reviewed. Constitutional: She is oriented to person, place, and time. She appears well-developed and well-nourished. No distress.  HENT:  Head: Normocephalic and atraumatic.  Mouth/Throat: Oropharynx is clear and moist.  Eyes: Conjunctivae are normal. Pupils are equal, round, and reactive to light.  Neck: Normal range of motion. Neck supple. No thyromegaly present.  Cardiovascular: Normal rate, regular rhythm, normal heart sounds and intact distal pulses.  Exam reveals no gallop and no friction rub.   No murmur heard. Pulmonary/Chest: Effort normal and breath sounds normal. She has no wheezes. She has no rales.  Musculoskeletal:       Left shoulder: She exhibits decreased range of motion, tenderness and pain. She exhibits no bony tenderness, no swelling, no effusion, no deformity, no laceration, no spasm, normal pulse and normal strength.  Lymphadenopathy:    She has no cervical adenopathy.  Neurological: She is alert and oriented to person, place, and time. No cranial nerve deficit. She exhibits normal muscle tone. Coordination normal.  Skin: She is not diaphoretic.  Erythematous mildly scaling rash along inguinal folds and under abdominal pannus.  Psychiatric: She has a normal mood and affect. Her behavior is normal. Judgment and thought content normal.        Assessment & Plan:  Type I (juvenile type) diabetes mellitus with peripheral circulatory disorders, not stated as uncontrolled(250.71) - Plan: CBC, Hemoglobin A1c  Pure hypercholesterolemia - Plan: CBC, Lipid panel, CK total and CKMB  Essential hypertension, benign - Plan: CBC, Comprehensive metabolic panel, Hemoglobin A1c  Candidiasis of skin  Shoulder strain, left, sequela   1. DMII: moderately controlled; obtain labs; continue  medication; encourage further weight loss. 2.  Hyperlipidemia: stable; improved compliance with statin; obtain labs; continue current medication. 3.  HTN:  Controlled; obtain labs; continue current medication. 4.  Candidiasis of skin: chronic recurrent issue especially with weight loss, obesity, DMIII.  Rx for Nystatin powder and Mycolog cream provided. 5. L shoulder strain: persistent; home exercise program provided to perform daily; if no improvement in 4-6 weeks, recommend ortho referral.  Rx for Flexeril provided to use qhs.  Meds ordered this encounter  Medications  . nystatin (MYCOSTATIN) powder    Sig: Apply topically 4 (four) times daily.    Dispense:  60 g    Refill:  5  . nystatin-triamcinolone (MYCOLOG II) cream    Sig: Apply topically 2 (two) times daily.    Dispense:  60 g    Refill:  3  . cyclobenzaprine (FLEXERIL) 5 MG tablet    Sig: One at bedtime for shoulder pain    Dispense:  30 tablet    Refill:  1

## 2012-07-25 LAB — COMPREHENSIVE METABOLIC PANEL
ALT: 16 U/L (ref 0–35)
AST: 14 U/L (ref 0–37)
Albumin: 3.9 g/dL (ref 3.5–5.2)
BUN: 15 mg/dL (ref 6–23)
CO2: 25 mEq/L (ref 19–32)
Calcium: 9.6 mg/dL (ref 8.4–10.5)
Chloride: 103 mEq/L (ref 96–112)
Potassium: 4.4 mEq/L (ref 3.5–5.3)

## 2012-07-25 LAB — HEMOGLOBIN A1C
Hgb A1c MFr Bld: 7.9 % — ABNORMAL HIGH (ref <5.7)
Mean Plasma Glucose: 180 mg/dL — ABNORMAL HIGH (ref <117)

## 2012-07-25 LAB — LIPID PANEL: Cholesterol: 263 mg/dL — ABNORMAL HIGH (ref 0–200)

## 2012-07-25 LAB — CK TOTAL AND CKMB (NOT AT ARMC): CK, MB: 0.8 ng/mL (ref 0.3–4.0)

## 2012-07-29 ENCOUNTER — Encounter: Payer: Self-pay | Admitting: Family Medicine

## 2012-07-29 ENCOUNTER — Ambulatory Visit (INDEPENDENT_AMBULATORY_CARE_PROVIDER_SITE_OTHER): Payer: BC Managed Care – PPO | Admitting: Family Medicine

## 2012-07-29 VITALS — BP 138/78 | HR 113 | Temp 98.7°F | Resp 18 | Ht 66.0 in | Wt 268.0 lb

## 2012-07-29 DIAGNOSIS — W57XXXA Bitten or stung by nonvenomous insect and other nonvenomous arthropods, initial encounter: Secondary | ICD-10-CM

## 2012-07-29 DIAGNOSIS — T148 Other injury of unspecified body region: Secondary | ICD-10-CM

## 2012-07-29 DIAGNOSIS — R51 Headache: Secondary | ICD-10-CM

## 2012-07-29 LAB — POCT CBC
Granulocyte percent: 49.8 %G (ref 37–80)
HCT, POC: 45 % (ref 37.7–47.9)
Hemoglobin: 14.3 g/dL (ref 12.2–16.2)
POC Granulocyte: 3.7 (ref 2–6.9)
RBC: 5 M/uL (ref 4.04–5.48)

## 2012-07-29 MED ORDER — DOXYCYCLINE HYCLATE 100 MG PO TABS: 100.0000 mg | ORAL_TABLET | Freq: Two times a day (BID) | ORAL | Status: DC

## 2012-07-29 NOTE — Progress Notes (Signed)
Subjective: 47 year old lady with a headache. 8 days ago she pulled a tick off her chest wall the lower sternal area. She did got it on her a day or 2 earlier probably been doing some yard work. Only the one tic was found.  She has had a headache for the past 2-3 days. She has not had any fever. She's been taking OTC Tylenol for headache. Her husband had recommend spot a fever last year, and he insisted she come on in today.  Objective: Pleasant lady in no major distress. HEENT normal. Chest clear. Heart regular without murmurs. No rashes were noted on trunk or extremities or palms.  Assessment: Headache Tick bite  Plan [facility] spot fever titer CBC Treat with doxycycline Results for orders placed in visit on 07/29/12  POCT CBC      Result Value Range   WBC 7.5  4.6 - 10.2 K/uL   Lymph, poc 3.3  0.6 - 3.4   POC LYMPH PERCENT 43.6  10 - 50 %L   MID (cbc) 0.5  0 - 0.9   POC MID % 6.6  0 - 12 %M   POC Granulocyte 3.7  2 - 6.9   Granulocyte percent 49.8  37 - 80 %G   RBC 5.00  4.04 - 5.48 M/uL   Hemoglobin 14.3  12.2 - 16.2 g/dL   HCT, POC 16.1  09.6 - 47.9 %   MCV 90.1  80 - 97 fL   MCH, POC 28.6  27 - 31.2 pg   MCHC 31.8  31.8 - 35.4 g/dL   RDW, POC 04.5     Platelet Count, POC 332  142 - 424 K/uL   MPV 9.2  0 - 99.8 fL

## 2012-07-29 NOTE — Patient Instructions (Addendum)
Take the doxycycline twice daily  Continue Tylenol for the headache  Get sufficient rest  Return if worse  [facility] Spotted Fever [facility] Spotted Fever (RMSF) is the oldest known tick-borne disease of people in the [place]. This disease was named because it was first described among people in the [facility] area who had an illness characterized by a rash with red-purple-black spots. This disease is caused by a rickettsia (Rickettsia rickettsii), a bacteria carried by the tick. The [facility] wood tick and the American dog tick, acquire and transmit the RMSF bacteria (pictures NOT actual size). When a larval, nymphal or adult tick feeds on an infected rodent or larger animal, the tick can become infected. Infected adult ticks then feed on people who may then get RMSF. The tick transmits the disease to humans during a prolonged period of feeding that lasts many hours, days or even a couple weeks. The bite is painless and frequently goes unnoticed. An infected female tick may also pass the rickettsial bacteria to her eggs that then may mature to be infected adult ticks. The rickettsia that causes RMSF can also get into a person's body through damaged skin. A tick bite is not necessary. People can get RMSF if they crush a tick and get it's blood or body fluids on their skin through a small cut or sore.  DIAGNOSIS Diagnosis is made by laboratory tests.  TREATMENT Treatment is with antibiotics (medications that kill rickettsia and other bacteria). Immediate treatment usually prevents death. GEOGRAPHIC RANGE This disease was reported only in the [facility] until 1931. RMSF has more recently been described among individuals in all states except [place], [place] and [place]. The highest reported incidences of RMSF now occur among residents of [place], [place], [place] and [address]. TIME OF YEAR  Most cases are diagnosed during late spring and summer when ticks are most  active. However, especially in the warmer [place] states, a few cases occur during the winter. SYMPTOMS   Symptoms of RMSF begin from 2 to 14 days after a tick bite. The most common early symptoms are fever, muscle aches and headache followed by nausea (feeling sick to your stomach) or vomiting.  The RMSF rash is typically delayed until 3 or more days after symptom onset, and eventually develops in 9 of 10 infected patients by the 5th day of illness. If the disease is not treated it can cause death. If you get a fever, headache, muscle aches, rash, nausea or vomiting within 2 weeks of a possible tick bite or exposure you should see your caregiver immediately. PREVENTION Ticks prefer to hide in shady, moist ground litter. They can often be found above the ground clinging to tall grass, brush, shrubs and low tree branches. They also inhabit lawns and gardens, especially at the edges of woodlands and around old stone walls. Within the areas where ticks generally live, no naturally vegetated area can be considered completely free of infected ticks. The best precaution against RMSF is to avoid contact with soil, leaf litter and vegetation as much as possible in tick infested areas. For those who enjoy gardening or walking in their yards, clear brush and mow tall grass around houses and at the edges of gardens. This may help reduce the tick population in the immediate area. Applications of chemical insecticides by a licensed professional in the spring (late May) and Fall (September) will also control ticks, especially in heavily infested areas. Treatment will never get rid of all the ticks.  Getting rid of small animal populations that host ticks will also decrease the tick population. When working in the garden, [employer], or handling soil and vegetation, wear light-colored protective clothing and gloves. Spot-check often to prevent ticks from reaching the skin. Ticks cannot jump or fly. They will not drop  from an above-ground perch onto a passing animal. Once a tick gains access to human skin it climbs upward until it reaches a more protected area. For example, the back of the knee, groin, navel, armpit, ears or nape of the neck. It then begins the slow process of embedding itself in the skin. Campers, hikers, field workers, and others who spend time in wooded, brushy or tall grassy areas can avoid exposure to ticks by using the following precautions:  Wear light-colored clothing with a tight weave to spot ticks more easily and prevent contact with the skin.  Wear long pants tucked into socks, long-sleeved shirts tucked into pants and enclosed shoes or boots along with insect repellent.  Spray clothes with insect repellent containing either DEET or Permethrin. Only DEET can be used on exposed skin. Follow the manufacturer's directions carefully.  Wear a hat and keep long hair pulled back.  Stay on cleared, well-worn trails whenever possible.  Spot-check yourself and others often for the presence of ticks on clothes. If you find one, there are likely to be others. Check thoroughly.  Remove clothes after leaving tick-infested areas. If possible, wash them to eliminate any unseen ticks. Check yourself, your children and any pets from head to toe for the presence of ticks.  Shower and shampoo. You can greatly reduce your chances of contracting RMSF if you remove attached ticks as soon as possible. Regular checks of the body, including all body sites covered by hair (head, armpits, genitals), allow removal of the tick before rickettsial transmission. To remove an attached tick, use a forceps or tweezers to detach the intact tick without leaving mouth parts in the skin. The tick bite wound should be cleansed after tick removal. Remember the most common symptoms of RMSF are fever, muscle aches, headache and nausea or vomiting with a later onset of rash. If you get these symptoms after a tick bite and  while living in an area where RMSF is found, RMSF should be suspected. If the disease is not treated, it can cause death. See your caregiver immediately if you get these symptoms. Do this even if not aware of a tick bite. Document Released: 06/18/2000 Document Revised: 05/29/2011 Document Reviewed: 02/08/2009 [facility] Patient Information 2013 [place], [place].

## 2012-07-30 LAB — ROCKY MTN SPOTTED FVR AB, IGM-BLOOD: ROCKY MTN SPOTTED FEVER, IGM: 0.11 IV

## 2012-08-02 ENCOUNTER — Encounter: Payer: Self-pay | Admitting: Family Medicine

## 2012-08-02 ENCOUNTER — Encounter: Payer: Self-pay | Admitting: *Deleted

## 2012-08-02 MED ORDER — FLUCONAZOLE 150 MG PO TABS: 150.0000 mg | ORAL_TABLET | Freq: Once | ORAL | Status: DC

## 2012-08-06 ENCOUNTER — Encounter: Payer: Self-pay | Admitting: Family Medicine

## 2012-09-06 ENCOUNTER — Other Ambulatory Visit: Payer: Self-pay | Admitting: Family Medicine

## 2012-10-28 ENCOUNTER — Ambulatory Visit (INDEPENDENT_AMBULATORY_CARE_PROVIDER_SITE_OTHER): Payer: BC Managed Care – PPO | Admitting: Family Medicine

## 2012-10-28 ENCOUNTER — Encounter: Payer: Self-pay | Admitting: Family Medicine

## 2012-10-28 VITALS — BP 132/84 | HR 76 | Temp 98.4°F | Resp 16 | Ht 66.0 in | Wt 259.8 lb

## 2012-10-28 DIAGNOSIS — K219 Gastro-esophageal reflux disease without esophagitis: Secondary | ICD-10-CM | POA: Insufficient documentation

## 2012-10-28 DIAGNOSIS — Z0000 Encounter for general adult medical examination without abnormal findings: Secondary | ICD-10-CM

## 2012-10-28 DIAGNOSIS — R112 Nausea with vomiting, unspecified: Secondary | ICD-10-CM | POA: Insufficient documentation

## 2012-10-28 DIAGNOSIS — E119 Type 2 diabetes mellitus without complications: Secondary | ICD-10-CM

## 2012-10-28 DIAGNOSIS — Z23 Encounter for immunization: Secondary | ICD-10-CM

## 2012-10-28 LAB — CBC WITH DIFFERENTIAL/PLATELET
Eosinophils Relative: 2 % (ref 0–5)
HCT: 42.2 % (ref 36.0–46.0)
Hemoglobin: 14.2 g/dL (ref 12.0–15.0)
Lymphocytes Relative: 47 % — ABNORMAL HIGH (ref 12–46)
Lymphs Abs: 2.6 10*3/uL (ref 0.7–4.0)
MCV: 84.2 fL (ref 78.0–100.0)
Monocytes Relative: 6 % (ref 3–12)
Platelets: 350 10*3/uL (ref 150–400)
RBC: 5.01 MIL/uL (ref 3.87–5.11)
WBC: 5.5 10*3/uL (ref 4.0–10.5)

## 2012-10-28 LAB — POCT URINALYSIS DIPSTICK
Blood, UA: NEGATIVE
Nitrite, UA: NEGATIVE
Urobilinogen, UA: 1
pH, UA: 6

## 2012-10-28 LAB — LIPID PANEL
Cholesterol: 202 mg/dL — ABNORMAL HIGH (ref 0–200)
LDL Cholesterol: 108 mg/dL — ABNORMAL HIGH (ref 0–99)
VLDL: 48 mg/dL — ABNORMAL HIGH (ref 0–40)

## 2012-10-28 LAB — VITAMIN B12: Vitamin B-12: 722 pg/mL (ref 211–911)

## 2012-10-28 LAB — COMPREHENSIVE METABOLIC PANEL
ALT: 33 U/L (ref 0–35)
AST: 27 U/L (ref 0–37)
Albumin: 4.2 g/dL (ref 3.5–5.2)
BUN: 8 mg/dL (ref 6–23)
Calcium: 9.5 mg/dL (ref 8.4–10.5)
Chloride: 105 mEq/L (ref 96–112)
Potassium: 4 mEq/L (ref 3.5–5.3)

## 2012-10-28 LAB — VITAMIN D 25 HYDROXY (VIT D DEFICIENCY, FRACTURES): Vit D, 25-Hydroxy: 31 ng/mL (ref 30–89)

## 2012-10-28 MED ORDER — FLUTICASONE PROPIONATE 50 MCG/ACT NA SUSP: 2.0000 | Freq: Every day | NASAL | Status: DC | PRN

## 2012-10-28 MED ORDER — BECLOMETHASONE DIPROPIONATE 80 MCG/ACT IN AERS: 2.0000 | INHALATION_SPRAY | Freq: Two times a day (BID) | RESPIRATORY_TRACT | Status: DC | PRN

## 2012-10-28 MED ORDER — ATORVASTATIN CALCIUM 20 MG PO TABS: 20.0000 mg | ORAL_TABLET | Freq: Every day | ORAL | Status: DC

## 2012-10-28 MED ORDER — LOSARTAN POTASSIUM 100 MG PO TABS: 100.0000 mg | ORAL_TABLET | Freq: Every day | ORAL | Status: DC

## 2012-10-28 MED ORDER — AZELASTINE HCL 0.1 % NA SOLN: 1.0000 | Freq: Two times a day (BID) | NASAL | Status: DC

## 2012-10-28 MED ORDER — SUCRALFATE 1 GM/10ML PO SUSP: 1.0000 g | Freq: Four times a day (QID) | ORAL | Status: DC

## 2012-10-28 MED ORDER — HYDROCHLOROTHIAZIDE 25 MG PO TABS: 25.0000 mg | ORAL_TABLET | Freq: Every day | ORAL | Status: DC

## 2012-10-28 MED ORDER — ALBUTEROL SULFATE HFA 108 (90 BASE) MCG/ACT IN AERS: 2.0000 | INHALATION_SPRAY | Freq: Four times a day (QID) | RESPIRATORY_TRACT | Status: DC | PRN

## 2012-10-28 MED ORDER — GLIPIZIDE-METFORMIN HCL 2.5-500 MG PO TABS: 2.0000 | ORAL_TABLET | Freq: Two times a day (BID) | ORAL | Status: DC

## 2012-10-28 NOTE — Assessment & Plan Note (Signed)
Recurrent; increase Nexium 22mg  to two daily; rx for Carafate provided to use before supper and at bedtime.

## 2012-10-28 NOTE — Progress Notes (Signed)
[address]   [place], [place]  [address]   [phone]  Subjective:    Patient ID: [name], female    DOB: 13-Jan-1966, 47 y.o.   MRN: [id number]  HPI This 47 y.o. female presents for evaluation for CPE.  Pap smear 08/28/11. Mammogram 06/10/12. Colonoscopy 2013.  Repeat in 5 years. TDAP 07/16/09 Pneumovax 12/18/05. Hepatitis B never.   Flu vaccine 2013. Eye exam 2012; glasses; [place].  Dental exam few months ago.  Nausea with vomiting: onset three months ago; intermittent issue.  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  Daily vomiting; can vomit once per day; usually vomits at night with laying supine.  Can be decent until laying down. Started belching, fluid coming up.  Vomiting bile.  Bought OTC Nexium one week ago.  Had been using cheaper band prior to that.  Pepto Bismol chewables children works the best.  Had not needed anything since lap band.  Last week stools have been black-green.  Taking Pepto Bismol nightly for one week.  No bloody stools; having diarrhea for past week.  No constipation. Not able to eat protein at all; tried fish at beginning of week and vomited all night; drinking water, clear broths.   A little dehydrated.       Review of Systems  Constitutional: Positive for appetite change and fatigue.  HENT: Positive for sinus pressure.   Eyes: Negative.   Respiratory: Negative.   Cardiovascular: Negative.   Gastrointestinal: Positive for nausea.  Endocrine: Negative.   Genitourinary: Positive for dyspareunia.  Musculoskeletal: Negative.   Skin: Negative.   Allergic/Immunologic: Negative.   Neurological: Negative.   Hematological: Negative.   Psychiatric/Behavioral: Negative.     Past Medical History  Diagnosis Date  . Diabetes mellitus   . Hypertension   . Anemia   . Migraines   . Thyroid disease   . Colon polyp   . IBS (irritable bowel syndrome)   . Fatigue   . Night sweats   . Lump in female breast   . Asthma   . Sinus problem   .  Reflux   . Methicillin resistant Staphylococcus aureus in conditions classified elsewhere and of unspecified site   . Paresthesias   . Migraine, unspecified, without mention of intractable migraine without mention of status migrainosus   . Unspecified acute reaction to stress   . Nonspecific abnormal results of thyroid function study   . Essential hypertension, benign   . Personal history of colonic polyps   . Pure hypercholesterolemia   . Type II or unspecified type diabetes mellitus without mention of complication, not stated as uncontrolled   . Morbid obesity   . Other B-complex deficiencies   . Edema   . Unspecified sleep apnea   . Symptomatic states associated with artificial menopause   . Allergy   . Arthritis     Past Surgical History  Procedure Laterality Date  . Cholecystectomy  03/1989  . Abdominal hysterectomy  1997    complete in 1997, partial was in 1995  . Knee surgery  2006  . Laparoscopic gastric banding  05/2009  . Cesarean section  1987  and 1989  . Abcess removal  1997    MRSA  . Tonsillectomy and adenoidectomy  1974  . Tubal ligation    . Colonoscopy  03/21/2011    Normal.  [facility].    Prior to Admission medications   Medication Sig Start Date End Date Taking? Authorizing Provider  albuterol (PROVENTIL HFA;VENTOLIN HFA)  108 (90 BASE) MCG/ACT inhaler Inhale 2 puffs into the lungs every 6 (six) hours as needed for wheezing. 02/29/12  Yes Shade Flood, MD  ALPRAZolam Prudy Feeler) 0.5 MG tablet Take 1 tablet (0.5 mg total) by mouth daily as needed for sleep. 09/06/12  Yes Ethelda Chick, MD  atorvastatin (LIPITOR) 20 MG tablet Take 1 tablet (20 mg total) by mouth daily. 04/02/12  Yes Heather M Marte, PA-C  azelastine (ASTELIN) 137 MCG/SPRAY nasal spray Place 1 spray into the nose 2 (two) times daily. Use in each nostril as directed   Yes Historical Provider, MD  beclomethasone (QVAR) 80 MCG/ACT inhaler Inhale 2 puffs into the lungs every 12 (twelve) hours as  needed. 03/06/12  Yes Ethelda Chick, MD  CALCIUM PO Take by mouth 3 (three) times daily.     Yes Historical Provider, MD  Cetirizine HCl (ZYRTEC PO) Take by mouth daily.     Yes Historical Provider, MD  cyclobenzaprine (FLEXERIL) 5 MG tablet One at bedtime for shoulder pain 07/24/12  Yes Ethelda Chick, MD  Fexofenadine HCl (ALLEGRA PO) Take by mouth.     Yes Historical Provider, MD  fluticasone (FLONASE) 50 MCG/ACT nasal spray Place 2 sprays into the nose daily as needed.    Yes Historical Provider, MD  glipiZIDE-metformin (METAGLIP) 2.5-500 MG per tablet TAKE TWO (2) TABLETS BY MOUTH 2 TIMES DAILY BEFORE A MEAL 07/11/12  Yes Ryan M Dunn, PA-C  hydrochlorothiazide 25 MG tablet Take 25 mg by mouth daily.     Yes Historical Provider, MD  ketoconazole (NIZORAL) 2 % cream Apply topically 2 (two) times daily. 04/09/12  Yes Ethelda Chick, MD  levalbuterol Pauline Aus) 1.25 MG/3ML nebulizer solution Take 1.25 mg by nebulization daily as needed.   Yes Historical Provider, MD  losartan (COZAAR) 100 MG tablet Take 100 mg by mouth daily.     Yes Historical Provider, MD  Multiple Vitamin (MULTIVITAMIN PO) Take by mouth daily.     Yes Historical Provider, MD  nystatin (MYCOSTATIN) powder Apply topically 4 (four) times daily. 07/24/12  Yes Ethelda Chick, MD  nystatin-triamcinolone (MYCOLOG II) cream Apply topically 2 (two) times daily. 07/24/12  Yes Ethelda Chick, MD  ondansetron (ZOFRAN ODT) 4 MG disintegrating tablet Take 1 tablet (4 mg total) by mouth every 8 (eight) hours as needed for nausea. 02/29/12  Yes Shade Flood, MD  sucralfate (CARAFATE) 1 GM/10ML suspension Take 10 mLs (1 g total) by mouth 4 (four) times daily. 10/28/12   Ethelda Chick, MD    Allergies  Allergen Reactions  . Amoxicillin Shortness Of Breath and Rash  . Levofloxacin Shortness Of Breath and Rash  . Vancomycin Anaphylaxis  . Ace Inhibitors Cough  . Codeine   . Meperidine Hcl   . Oxycodone-Acetaminophen   .  Oxycodone-Acetaminophen   . Penicillins Swelling and Rash  . Tequin (Gatifloxacin) Rash    History   Social History  . Marital Status: Married    Spouse Name: N/A    Number of Children: 3  . Years of Education: college   Occupational History  . budget analyst [employer]   Social History Main Topics  . Smoking status: Never Smoker   . Smokeless tobacco: Not on file  . Alcohol Use: 0.0 oz/week    0 drink(s) per week     Comment: rarely - once per week at most  . Drug Use: No  . Sexually Active: No   Other Topics Concern  . Not  on file   Social History Narrative   Exercise: walking two days per week, 30 minutes.       Married x 27 years, happily married no abuse.       Children: 3 children; 1 grandchild; two step grandchildren; 1 gg.      Lives: with husband, Baxter Hire.      Employment:  [employer] x 1996.  Loves work.      Tobacco: never      Alcohol: socially.      Exercise:  Walking around campus.    Family History  Problem Relation Age of Onset  . Diabetes Mother   . Heart disease Mother   . Other Mother     muscle disease  . Hypertension Mother   . Hyperlipidemia Mother   . Diabetes Father   . Heart disease Father     AMI/CABG/valve replacement age 93.  . Stroke Father   . Diabetes Brother   . Heart disease Brother     stent age 58.  . Breast cancer      2 Aunts  . Fibromyalgia Daughter   . Crohn's disease Daughter   . Hypertension Son   . Alzheimer's disease Maternal Grandmother   . Emphysema Maternal Grandfather   . Heart disease Paternal Grandmother        Objective:   Physical Exam  Nursing note and vitals reviewed. Constitutional: She is oriented to person, place, and time. She appears well-developed and well-nourished. No distress.  HENT:  Head: Normocephalic and atraumatic.  Right Ear: External ear normal.  Left Ear: External ear normal.  Nose: Nose normal.  Mouth/Throat: Oropharynx is clear and moist.  Eyes: Conjunctivae and EOM are normal.  Pupils are equal, round, and reactive to light.  Neck: Normal range of motion. Neck supple. No JVD present. No tracheal deviation present. No thyromegaly present.  Cardiovascular: Normal rate, regular rhythm, normal heart sounds and intact distal pulses.  Exam reveals no gallop and no friction rub.   No murmur heard. Pulmonary/Chest: Effort normal and breath sounds normal. She has no wheezes. She has no rales.  Abdominal: Soft. Bowel sounds are normal. She exhibits no distension and no mass. There is no tenderness. There is no rebound and no guarding.  Genitourinary: Rectum normal and vagina normal. No breast swelling, tenderness, discharge or bleeding. There is no rash, tenderness or lesion on the right labia. There is no rash, tenderness or lesion on the left labia.  Musculoskeletal: Normal range of motion.  Lymphadenopathy:    She has no cervical adenopathy.  Neurological: She is alert and oriented to person, place, and time. She has normal reflexes. No cranial nerve deficit. She exhibits normal muscle tone. Coordination normal.  Skin: Skin is warm and dry. Rash noted. She is not diaphoretic. No erythema. No pallor.  Psychiatric: She has a normal mood and affect. Her behavior is normal. Judgment and thought content normal.   EKG: NSR      Assessment & Plan:  Routine general medical examination at a health care facility - Plan: CBC with Differential, CK, Comprehensive metabolic panel, Hemoglobin A1c, Lipid panel, TSH, Vitamin B12, Vitamin D 25 hydroxy, POCT urinalysis dipstick, EKG 12-Lead, IFOBT POC (occult bld, rslt in office), CANCELED: Microalbumin, urine  Type II or unspecified type diabetes mellitus without mention of complication, not stated as uncontrolled - Plan: Hepatitis B vaccine adult IM  Nausea with vomiting  GERD (gastroesophageal reflux disease)

## 2012-10-28 NOTE — Assessment & Plan Note (Signed)
New. Onset three months ago after Doxycycline therapy; GERD related symptoms with lying supine with subsequent vomiting.  Increase Nexium to two tablets daily.  Rx for Carafate provided to use before supper and at bedtime. If no improvement in three weeks, recommend consultation by Dr. Sula Soda and Dr. Micheline [facility].

## 2012-10-28 NOTE — Patient Instructions (Signed)
1.  Take Carafate before supper and at bedtime. 2.  Call Dr. Madilyn Fireman and Dr. Ermalene Searing offices for appointments in upcoming 2-3 weeks if still having vomiting.

## 2012-10-28 NOTE — Assessment & Plan Note (Signed)
Anticipatory guidance --- exercise, weight loss. Pap smear UTD. Mammogram UTD.  Colonoscopy UTD.  Immunizations UTD; s/p Hepatitis B#1 in office.  Obtain labs.

## 2012-10-29 ENCOUNTER — Ambulatory Visit (INDEPENDENT_AMBULATORY_CARE_PROVIDER_SITE_OTHER): Payer: BC Managed Care – PPO | Admitting: Surgery

## 2012-10-29 ENCOUNTER — Telehealth (INDEPENDENT_AMBULATORY_CARE_PROVIDER_SITE_OTHER): Payer: Self-pay | Admitting: General Surgery

## 2012-10-29 ENCOUNTER — Encounter (INDEPENDENT_AMBULATORY_CARE_PROVIDER_SITE_OTHER): Payer: Self-pay | Admitting: Surgery

## 2012-10-29 VITALS — BP 140/78 | HR 72 | Resp 16 | Ht 67.0 in | Wt 260.2 lb

## 2012-10-29 DIAGNOSIS — Z4651 Encounter for fitting and adjustment of gastric lap band: Secondary | ICD-10-CM

## 2012-10-29 DIAGNOSIS — Z9884 Bariatric surgery status: Secondary | ICD-10-CM

## 2012-10-29 NOTE — Telephone Encounter (Signed)
Pt called to report problems with nausea and vomiting since May.  She has been using soft and liquid diet without resolution.  Pt went to her PCP and is taking Nexium BID and Carafate QID.  She has been vomiting this morning again and called for advice.  Scheduled appt with Dr. Daphine Deutscher to remove some fluid from her band and counsel her.

## 2012-10-29 NOTE — Progress Notes (Signed)
Lapband Fill Encounter Problem List:   Patient Active Problem List   Diagnosis Date Noted  . Routine general medical examination at a health care facility 10/28/2012  . Nausea with vomiting 10/28/2012  . GERD (gastroesophageal reflux disease) 10/28/2012  . Candidiasis 04/09/2012  . Pneumonia 04/09/2012  . Pure hypercholesterolemia 12/20/2011  . Essential hypertension, benign 12/20/2011  . Anxiety 12/20/2011  . Allergic rhinitis 12/20/2011  . Asthma 12/20/2011  . Lapband APL with HH repair 03/31/2011  . DIABETES, TYPE 2 06/30/2007  . HYPERLIPIDEMIA 06/30/2007  . COUGH 06/30/2007  . HYPERTENSION 06/27/2007  . ALLERGIC RHINITIS 06/27/2007  . ASTHMA 06/27/2007  . GERD 06/27/2007    Ksenia P Graley Body mass index is 40.74 kg/(m^2). Weight loss since surgery  93  Having regurgitation?:  yes  Feel that they need a fill?  unfill  Nocturnal reflux?  yes  Amount of fill  -1     Instructions given and weight loss goals discussed.    She coming up on her daughter who had her have her band removed and had to have surgery for her Crohn's disease and also subsequently has had a gastric sleeve. This was done over [facility].  Today she's been having trouble  keeping anything down I went ahead and removed 1 cc from her band.  I will see her back in one month in routine followup.  Matt B. Daphine Deutscher, MD, FACS

## 2012-10-29 NOTE — Patient Instructions (Signed)

## 2012-11-03 ENCOUNTER — Encounter: Payer: Self-pay | Admitting: Family Medicine

## 2012-11-23 ENCOUNTER — Encounter: Payer: Self-pay | Admitting: Family Medicine

## 2012-11-28 MED ORDER — AZITHROMYCIN 250 MG PO TABS: ORAL_TABLET | ORAL | Status: DC

## 2012-11-29 ENCOUNTER — Other Ambulatory Visit: Payer: Self-pay | Admitting: Family Medicine

## 2012-11-29 ENCOUNTER — Encounter (INDEPENDENT_AMBULATORY_CARE_PROVIDER_SITE_OTHER): Payer: BC Managed Care – PPO | Admitting: Surgery

## 2012-12-19 ENCOUNTER — Ambulatory Visit (INDEPENDENT_AMBULATORY_CARE_PROVIDER_SITE_OTHER): Payer: BC Managed Care – PPO | Admitting: *Deleted

## 2012-12-19 DIAGNOSIS — Z23 Encounter for immunization: Secondary | ICD-10-CM

## 2012-12-19 MED ORDER — HEPATITIS B VAC RECOMBINANT 5 MCG/0.5ML IJ SUSP: 0.5000 mL | Freq: Once | INTRAMUSCULAR | Status: DC

## 2012-12-19 NOTE — Progress Notes (Signed)
  Subjective:    Patient ID: [name], female    DOB: December 28, 1965, 47 y.o.   MRN: [id number]  HPI    Review of Systems     Objective:   Physical Exam        Assessment & Plan:  Patient here for 2nd Hep B injection and Flu injection only.

## 2013-01-09 ENCOUNTER — Encounter (INDEPENDENT_AMBULATORY_CARE_PROVIDER_SITE_OTHER): Payer: BC Managed Care – PPO | Admitting: Surgery

## 2013-01-22 ENCOUNTER — Ambulatory Visit: Payer: BC Managed Care – PPO | Admitting: Family Medicine

## 2013-02-03 ENCOUNTER — Ambulatory Visit: Payer: BC Managed Care – PPO | Admitting: Family Medicine

## 2013-02-06 ENCOUNTER — Other Ambulatory Visit: Payer: Self-pay | Admitting: Family Medicine

## 2013-02-07 NOTE — Telephone Encounter (Signed)
Please call in refill of Alprazolam to pharmacy. 

## 2013-02-12 NOTE — Telephone Encounter (Signed)
done

## 2013-03-05 ENCOUNTER — Ambulatory Visit: Payer: BC Managed Care – PPO | Admitting: Family Medicine

## 2013-04-22 ENCOUNTER — Ambulatory Visit: Payer: BC Managed Care – PPO | Admitting: Family Medicine

## 2013-04-29 ENCOUNTER — Ambulatory Visit (INDEPENDENT_AMBULATORY_CARE_PROVIDER_SITE_OTHER): Payer: BC Managed Care – PPO | Admitting: Family Medicine

## 2013-04-29 DIAGNOSIS — Z23 Encounter for immunization: Secondary | ICD-10-CM

## 2013-05-12 ENCOUNTER — Other Ambulatory Visit: Payer: Self-pay

## 2013-05-12 ENCOUNTER — Encounter: Payer: Self-pay | Admitting: Family Medicine

## 2013-05-12 ENCOUNTER — Ambulatory Visit (INDEPENDENT_AMBULATORY_CARE_PROVIDER_SITE_OTHER): Payer: BC Managed Care – PPO | Admitting: Family Medicine

## 2013-05-12 VITALS — BP 138/81 | HR 100 | Temp 98.8°F | Resp 18 | Wt 293.0 lb

## 2013-05-12 DIAGNOSIS — F411 Generalized anxiety disorder: Secondary | ICD-10-CM

## 2013-05-12 DIAGNOSIS — F419 Anxiety disorder, unspecified: Secondary | ICD-10-CM

## 2013-05-12 DIAGNOSIS — G5600 Carpal tunnel syndrome, unspecified upper limb: Secondary | ICD-10-CM

## 2013-05-12 DIAGNOSIS — M25539 Pain in unspecified wrist: Secondary | ICD-10-CM

## 2013-05-12 DIAGNOSIS — M778 Other enthesopathies, not elsewhere classified: Secondary | ICD-10-CM

## 2013-05-12 DIAGNOSIS — G5601 Carpal tunnel syndrome, right upper limb: Secondary | ICD-10-CM

## 2013-05-12 DIAGNOSIS — E1165 Type 2 diabetes mellitus with hyperglycemia: Secondary | ICD-10-CM

## 2013-05-12 DIAGNOSIS — M25531 Pain in right wrist: Secondary | ICD-10-CM

## 2013-05-12 DIAGNOSIS — Z1231 Encounter for screening mammogram for malignant neoplasm of breast: Secondary | ICD-10-CM

## 2013-05-12 DIAGNOSIS — IMO0001 Reserved for inherently not codable concepts without codable children: Secondary | ICD-10-CM

## 2013-05-12 DIAGNOSIS — M65839 Other synovitis and tenosynovitis, unspecified forearm: Secondary | ICD-10-CM

## 2013-05-12 DIAGNOSIS — M65849 Other synovitis and tenosynovitis, unspecified hand: Secondary | ICD-10-CM

## 2013-05-12 DIAGNOSIS — M25532 Pain in left wrist: Principal | ICD-10-CM

## 2013-05-12 LAB — GLUCOSE, POCT (MANUAL RESULT ENTRY): POC GLUCOSE: 107 mg/dL — AB (ref 70–99)

## 2013-05-12 MED ORDER — ALPRAZOLAM 0.5 MG PO TABS: 0.5000 mg | ORAL_TABLET | Freq: Every evening | ORAL | Status: DC | PRN

## 2013-05-12 MED ORDER — MELOXICAM 15 MG PO TABS: 15.0000 mg | ORAL_TABLET | Freq: Every day | ORAL | Status: DC

## 2013-05-12 MED ORDER — PREDNISONE 20 MG PO TABS: ORAL_TABLET | ORAL | Status: DC

## 2013-05-12 MED ORDER — FLUOXETINE HCL 20 MG PO TABS: 20.0000 mg | ORAL_TABLET | Freq: Every day | ORAL | Status: DC

## 2013-05-12 NOTE — Progress Notes (Signed)
Subjective:    Patient ID: [name], female    DOB: [birth year], 48 y.o.   MRN: [id number]  05/12/2013  Hand Pain and Follow-up   HPI This 48 y.o. female presents for same day appointment for the following:  1. B hand pain:  Onset four weeks ago.  R hand is worse than L hand.  No injury.  Wrists B are sore.  Extending wrist causes pain to shoot into hands.  If going to pick up envelope, pain shoots into hands. No pain shooting into forearms.  No elbow pain other than R side lateral mild. Some B shoulder pain.  +n/t in hands for past week in R; numb in morning.  Over the weekend, +nighttime numbness R.  R hand dominant.  No hand swelling.  Lightening into fingers 2nd-4th fingers.  No picking up grandchild due to R shoulder pain.  Shoulder pain for one year.  Some neck pain and stiffness.  S/p chiropractor who felt like hands tendonitis; advised Ibuprofen/Aleve one every eight hours for 1.5 weeks. No xrays of neck or hands or wrists.  Have been using wrist splints on R; wearing wrist splint daily and nightly.  +weakness opening jars.  2. DMII: uncontrolled at visit eight months ago.  Now remembering Metaglip twice daily due to HgbA1c of 8.2 at last visit.  No other medications for diabetes at this time.    3.  Hyperlipidemia: not taking Lipitor.  Forgetting Lipitor.    4. HTN: taking Losartan 1/2 tablet daily; taking one whole HCTZ.  5.  Anxiety/depression:  Worsening due to declining health of mother, father-in-law, and daughter.  Majors stressors.  Taking Xanax qhs for sleep.  Frequent crying.   Review of Systems  Constitutional: Negative for fever, chills, diaphoresis and fatigue.  Respiratory: Negative for cough, shortness of breath and stridor.   Cardiovascular: Negative for chest pain, palpitations and leg swelling.  Endocrine: Negative for polydipsia, polyphagia and polyuria.  Musculoskeletal: Positive for arthralgias, myalgias, neck pain and neck stiffness. Negative for joint  swelling.  Skin: Positive for color change and rash. Negative for pallor and wound.  Neurological: Positive for weakness and numbness.  Psychiatric/Behavioral: Positive for sleep disturbance and dysphoric mood. Negative for suicidal ideas and self-injury. The patient is nervous/anxious.     Past Medical History  Diagnosis Date  . Diabetes mellitus   . Hypertension   . Anemia   . Migraines   . Thyroid disease   . Colon polyp   . IBS (irritable bowel syndrome)   . Fatigue   . Night sweats   . Lump in female breast   . Asthma   . Sinus problem   . Reflux   . Methicillin resistant Staphylococcus aureus in conditions classified elsewhere and of unspecified site   . Paresthesias   . Migraine, unspecified, without mention of intractable migraine without mention of status migrainosus   . Unspecified acute reaction to stress   . Nonspecific abnormal results of thyroid function study   . Essential hypertension, benign   . Personal history of colonic polyps   . Pure hypercholesterolemia   . Type II or unspecified type diabetes mellitus without mention of complication, not stated as uncontrolled   . Morbid obesity   . Other B-complex deficiencies   . Edema   . Unspecified sleep apnea   . Symptomatic states associated with artificial menopause   . Allergy   . Arthritis    Allergies  Allergen Reactions  . Amoxicillin Shortness  Of Breath and Rash  . Levofloxacin Shortness Of Breath and Rash  . Vancomycin Anaphylaxis  . Ace Inhibitors Cough  . Codeine   . Meperidine Hcl   . Oxycodone-Acetaminophen   . Oxycodone-Acetaminophen   . Penicillins Swelling and Rash  . Tequin [Gatifloxacin] Rash   Current Outpatient Prescriptions  Medication Sig Dispense Refill  . albuterol (PROVENTIL HFA;VENTOLIN HFA) 108 (90 BASE) MCG/ACT inhaler Inhale 2 puffs into the lungs every 6 (six) hours as needed for wheezing.  1 Inhaler  1  . ALPRAZolam (XANAX) 0.5 MG tablet Take 1 tablet (0.5 mg total) by  mouth at bedtime as needed for anxiety.  30 tablet  3  . atorvastatin (LIPITOR) 20 MG tablet Take 1 tablet (20 mg total) by mouth daily.  30 tablet  11  . azelastine (ASTELIN) 137 MCG/SPRAY nasal spray Place 1 spray into the nose 2 (two) times daily. Use in each nostril as directed  30 mL  11  . beclomethasone (QVAR) 80 MCG/ACT inhaler Inhale 2 puffs into the lungs every 12 (twelve) hours as needed.  1 Inhaler  11  . CALCIUM PO Take by mouth 3 (three) times daily.        . Cetirizine HCl (ZYRTEC PO) Take by mouth daily.        . cyclobenzaprine (FLEXERIL) 5 MG tablet One at bedtime for shoulder pain  30 tablet  1  . Fexofenadine HCl (ALLEGRA PO) Take by mouth.        . fluticasone (FLONASE) 50 MCG/ACT nasal spray Place 2 sprays into the nose daily as needed.  16 g  11  . glipiZIDE-metformin (METAGLIP) 2.5-500 MG per tablet Take 2 tablets by mouth 2 (two) times daily before a meal.  120 tablet  11  . hydrochlorothiazide (HYDRODIURIL) 25 MG tablet Take 1 tablet (25 mg total) by mouth daily.  30 tablet  11  . ketoconazole (NIZORAL) 2 % cream Apply topically 2 (two) times daily.  60 g  0  . levalbuterol (XOPENEX) 1.25 MG/3ML nebulizer solution Take 1.25 mg by nebulization daily as needed.      [name] losartan (COZAAR) 100 MG tablet Take 1 tablet (100 mg total) by mouth daily.  30 tablet  11  . Multiple Vitamin (MULTIVITAMIN PO) Take by mouth daily.        [name] nystatin (MYCOSTATIN) powder Apply topically 4 (four) times daily.  60 g  5  . nystatin-triamcinolone (MYCOLOG II) cream Apply topically 2 (two) times daily.  60 g  3  . ondansetron (ZOFRAN ODT) 4 MG disintegrating tablet Take 1 tablet (4 mg total) by mouth every 8 (eight) hours as needed for nausea.  10 tablet  0  . FLUoxetine (PROZAC) 20 MG tablet Take 1 tablet (20 mg total) by mouth daily.  30 tablet  5  . meloxicam (MOBIC) 15 MG tablet Take 1 tablet (15 mg total) by mouth daily.  30 tablet  2  . predniSONE (DELTASONE) 20 MG tablet Two tablets daily  x 5 days then one tablet daily x 5 days  15 tablet  0   Current Facility-Administered Medications  Medication Dose Route Frequency Provider Last Rate Last Dose  . hepatitis B vac recombinant (RECOMBIVAX) injection 5 mcg  0.5 mL Intramuscular Once Wardell Honour, MD       History   Social History  . Marital Status: Married    Spouse Name: N/A    Number of Children: 3  . 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 Smoking status: Never Smoker   . Smokeless tobacco: Not on file  . Alcohol Use: 0.0 oz/week    0 drink(s) per week     Comment: rarely - once per week at most  . Drug Use: No  . Sexual Activity: No   Other Topics Concern  . Not on file   Social History Narrative   Exercise: walking two days per week, 30 minutes.       Married x 27 years, happily married no abuse.       Children: 3 children; 1 grandchild; two step grandchildren; 1 gg.      Lives: with husband, Cyril Mourning.      Employment:  [employer] x 1996.  [employer] work.      Tobacco: never      Alcohol: socially.      Exercise:  Walking around campus.       Objective:    BP 138/81  Pulse 100  Temp(Src) 98.8 F (37.1 C) (Oral)  Resp 18  Wt 293 lb (132.904 kg)  SpO2 97% Physical Exam  Constitutional: She is oriented to person, place, and time. She appears well-developed and well-nourished. No distress.  obese  HENT:  Head: Normocephalic and atraumatic.  Eyes: Conjunctivae and EOM are normal. Pupils are equal, round, and reactive to light.  Neck: Normal range of motion and full passive range of motion without pain. Neck supple. No thyromegaly present.  Cardiovascular: Normal rate, regular rhythm and intact distal pulses.  Exam reveals no gallop and no friction rub.   No murmur heard. Pulmonary/Chest: Effort normal and breath sounds normal. She has no wheezes. She has no rales.  Musculoskeletal:       Right shoulder: Normal.       Left shoulder:  Normal.       Right elbow: She exhibits normal range of motion and no swelling. Tenderness found. Lateral epicondyle tenderness noted. No radial head, no medial epicondyle and no olecranon process tenderness noted.       Right wrist: She exhibits normal range of motion, no tenderness and no bony tenderness.       Cervical back: Normal. She exhibits normal range of motion, no tenderness, no bony tenderness, no pain and no spasm.       Right hand: She exhibits normal range of motion, no tenderness, no bony tenderness, normal capillary refill, no deformity and no swelling. Normal sensation noted. Normal strength noted. She exhibits no finger abduction, no thumb/finger opposition and no wrist extension trouble.  Tinel and Phalen's negative.  Lymphadenopathy:    She has no cervical adenopathy.  Neurological: She is alert and oriented to person, place, and time. No cranial nerve deficit. She exhibits normal muscle tone. Coordination normal.  Skin: Skin is warm and dry. She is not diaphoretic.  Psychiatric: She has a normal mood and affect. Her behavior is normal. Judgment and thought content normal.   Results for orders placed in visit on 05/12/13  GLUCOSE, POCT (MANUAL RESULT ENTRY)      Result Value Ref Range   POC Glucose 107 (*) 70 - 99 mg/dl       Assessment & Plan:  Bilateral wrist pain:  New.  Secondary to wrist tendonitis and CTS.  Stop Aleve. Rx for Prednisone taper; then to start Mobic.  Tendonitis of wrist, right> left:  New.  Rx for Prednisone taper; B wrist splints provided to use daily  and nightly for two weeks and then just nightly; rx for Mobic to start after completing Prednisone.  Ice wrists bid.  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  Start B complex daily; wrist splints qhs.  If no improvement in one month, refer to ortho.  Type II or unspecified type diabetes mellitus without mention of complication, uncontrolled - Uncontrolled; stable glucose today with improved  compliance with medication; significant weight gain in past six months; check sugars daily while taking Prednisone; pt agreeable. Plan: POCT glucose (manual entry)  Anxiety: worsening due to multiple family stressors; restart Prozac 20mg  daily; refill of Xanax qhs provided.  Meds ordered this encounter  Medications  . FLUoxetine (PROZAC) 20 MG tablet    Sig: Take 1 tablet (20 mg total) by mouth daily.    Dispense:  30 tablet    Refill:  5  . predniSONE (DELTASONE) 20 MG tablet    Sig: Two tablets daily x 5 days then one tablet daily x 5 days    Dispense:  15 tablet    Refill:  0  . meloxicam (MOBIC) 15 MG tablet    Sig: Take 1 tablet (15 mg total) by mouth daily.    Dispense:  30 tablet    Refill:  2  . ALPRAZolam (XANAX) 0.5 MG tablet    Sig: Take 1 tablet (0.5 mg total) by mouth at bedtime as needed for anxiety.    Dispense:  30 tablet    Refill:  3    No Follow-up on file.    Reginia Forts, M.D.  [facility] [address] [place], [place]  [address] [phone] phone [phone] fax

## 2013-05-19 ENCOUNTER — Encounter: Payer: Self-pay | Admitting: Family Medicine

## 2013-05-21 MED ORDER — GABAPENTIN 300 MG PO CAPS: ORAL_CAPSULE | ORAL | Status: DC

## 2013-05-26 ENCOUNTER — Encounter: Payer: Self-pay | Admitting: Family Medicine

## 2013-05-26 ENCOUNTER — Ambulatory Visit (INDEPENDENT_AMBULATORY_CARE_PROVIDER_SITE_OTHER): Payer: BC Managed Care – PPO | Admitting: Family Medicine

## 2013-05-26 VITALS — BP 134/72 | HR 97 | Temp 99.3°F | Resp 18 | Ht 66.0 in | Wt 290.2 lb

## 2013-05-26 DIAGNOSIS — I10 Essential (primary) hypertension: Secondary | ICD-10-CM

## 2013-05-26 DIAGNOSIS — B379 Candidiasis, unspecified: Secondary | ICD-10-CM

## 2013-05-26 DIAGNOSIS — E119 Type 2 diabetes mellitus without complications: Secondary | ICD-10-CM

## 2013-05-26 DIAGNOSIS — F419 Anxiety disorder, unspecified: Secondary | ICD-10-CM

## 2013-05-26 DIAGNOSIS — G5600 Carpal tunnel syndrome, unspecified upper limb: Secondary | ICD-10-CM

## 2013-05-26 DIAGNOSIS — M79641 Pain in right hand: Secondary | ICD-10-CM

## 2013-05-26 DIAGNOSIS — E7800 Pure hypercholesterolemia, unspecified: Secondary | ICD-10-CM

## 2013-05-26 DIAGNOSIS — M79609 Pain in unspecified limb: Secondary | ICD-10-CM

## 2013-05-26 LAB — CBC WITH DIFFERENTIAL/PLATELET
BASOS ABS: 0.1 10*3/uL (ref 0.0–0.1)
BASOS PCT: 1 % (ref 0–1)
EOS ABS: 0.2 10*3/uL (ref 0.0–0.7)
Eosinophils Relative: 2 % (ref 0–5)
HCT: 41.5 % (ref 36.0–46.0)
HEMOGLOBIN: 13.9 g/dL (ref 12.0–15.0)
Lymphocytes Relative: 34 % (ref 12–46)
Lymphs Abs: 2.7 10*3/uL (ref 0.7–4.0)
MCH: 28.5 pg (ref 26.0–34.0)
MCHC: 33.5 g/dL (ref 30.0–36.0)
MCV: 85.2 fL (ref 78.0–100.0)
Monocytes Absolute: 0.6 10*3/uL (ref 0.1–1.0)
Monocytes Relative: 8 % (ref 3–12)
NEUTROS PCT: 55 % (ref 43–77)
Neutro Abs: 4.4 10*3/uL (ref 1.7–7.7)
Platelets: 278 10*3/uL (ref 150–400)
RBC: 4.87 MIL/uL (ref 3.87–5.11)
RDW: 14.3 % (ref 11.5–15.5)
WBC: 8 10*3/uL (ref 4.0–10.5)

## 2013-05-26 LAB — LIPID PANEL
CHOL/HDL RATIO: 3.9 ratio
Cholesterol: 239 mg/dL — ABNORMAL HIGH (ref 0–200)
HDL: 61 mg/dL (ref >39)
LDL Cholesterol: 110 mg/dL — ABNORMAL HIGH (ref 0–99)
Triglycerides: 340 mg/dL — ABNORMAL HIGH (ref <150)
VLDL: 68 mg/dL — AB (ref 0–40)

## 2013-05-26 LAB — COMPLETE METABOLIC PANEL WITH GFR
ALBUMIN: 4 g/dL (ref 3.5–5.2)
ALK PHOS: 54 U/L (ref 39–117)
ALT: 29 U/L (ref 0–35)
AST: 21 U/L (ref 0–37)
BILIRUBIN TOTAL: 0.5 mg/dL (ref 0.2–1.2)
BUN: 13 mg/dL (ref 6–23)
CO2: 28 mEq/L (ref 19–32)
Calcium: 9.3 mg/dL (ref 8.4–10.5)
Chloride: 98 mEq/L (ref 96–112)
Creat: 0.7 mg/dL (ref 0.50–1.10)
GFR, Est African American: >89 mL/min
GFR, Est Non African American: >89 mL/min
Glucose, Bld: 191 mg/dL — ABNORMAL HIGH (ref 70–99)
POTASSIUM: 4.2 meq/L (ref 3.5–5.3)
SODIUM: 137 meq/L (ref 135–145)
TOTAL PROTEIN: 6.4 g/dL (ref 6.0–8.3)

## 2013-05-26 LAB — POCT GLYCOSYLATED HEMOGLOBIN (HGB A1C): HEMOGLOBIN A1C: 8.3

## 2013-05-26 LAB — CK: CK TOTAL: 26 U/L (ref 7–177)

## 2013-05-26 MED ORDER — NYSTATIN 100000 UNIT/GM EX POWD: Freq: Two times a day (BID) | CUTANEOUS | Status: DC
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
## 2013-05-28 ENCOUNTER — Encounter: Payer: Self-pay | Admitting: Family Medicine

## 2013-05-30 ENCOUNTER — Ambulatory Visit
Admission: RE | Admit: 2013-05-30 | Discharge: 2013-05-30 | Disposition: A | Discharge status: Home / Self Care | Payer: BC Managed Care – PPO | Source: Ambulatory Visit

## 2013-05-30 DIAGNOSIS — Z1231 Encounter for screening mammogram for malignant neoplasm of breast: Secondary | ICD-10-CM

## 2013-06-04 ENCOUNTER — Encounter: Payer: Self-pay | Admitting: Family Medicine

## 2013-06-05 ENCOUNTER — Other Ambulatory Visit: Payer: Self-pay

## 2013-06-05 MED ORDER — ATORVASTATIN CALCIUM 20 MG PO TABS: 20.0000 mg | ORAL_TABLET | Freq: Every day | ORAL | Status: DC

## 2013-06-19 ENCOUNTER — Encounter: Payer: Self-pay | Admitting: Family Medicine

## 2013-07-03 HISTORY — PX: OTHER SURGICAL HISTORY: SHX169

## 2013-08-13 ENCOUNTER — Other Ambulatory Visit: Payer: Self-pay | Admitting: Family Medicine

## 2013-08-26 ENCOUNTER — Ambulatory Visit: Payer: BC Managed Care – PPO | Admitting: Family Medicine

## 2013-08-27 ENCOUNTER — Ambulatory Visit (INDEPENDENT_AMBULATORY_CARE_PROVIDER_SITE_OTHER): Payer: BC Managed Care – PPO | Admitting: Family Medicine

## 2013-08-27 ENCOUNTER — Encounter: Payer: Self-pay | Admitting: Family Medicine

## 2013-08-27 VITALS — BP 131/84 | HR 100 | Temp 98.1°F | Resp 18 | Ht 66.0 in | Wt 289.4 lb

## 2013-08-27 DIAGNOSIS — E7800 Pure hypercholesterolemia, unspecified: Secondary | ICD-10-CM

## 2013-08-27 DIAGNOSIS — I10 Essential (primary) hypertension: Secondary | ICD-10-CM

## 2013-08-27 DIAGNOSIS — F411 Generalized anxiety disorder: Secondary | ICD-10-CM

## 2013-08-27 DIAGNOSIS — F419 Anxiety disorder, unspecified: Secondary | ICD-10-CM

## 2013-08-27 DIAGNOSIS — E119 Type 2 diabetes mellitus without complications: Secondary | ICD-10-CM

## 2013-08-27 LAB — COMPREHENSIVE METABOLIC PANEL
ALBUMIN: 4.2 g/dL (ref 3.5–5.2)
ALT: 40 U/L — ABNORMAL HIGH (ref 0–35)
AST: 34 U/L (ref 0–37)
Alkaline Phosphatase: 53 U/L (ref 39–117)
BUN: 15 mg/dL (ref 6–23)
CALCIUM: 9.9 mg/dL (ref 8.4–10.5)
CHLORIDE: 98 meq/L (ref 96–112)
CO2: 27 mEq/L (ref 19–32)
Creat: 0.79 mg/dL (ref 0.50–1.10)
GLUCOSE: 256 mg/dL — AB (ref 70–99)
Potassium: 4.4 mEq/L (ref 3.5–5.3)
Sodium: 137 mEq/L (ref 135–145)
Total Bilirubin: 0.6 mg/dL (ref 0.2–1.2)
Total Protein: 6.7 g/dL (ref 6.0–8.3)

## 2013-08-27 LAB — LIPID PANEL
Cholesterol: 221 mg/dL — ABNORMAL HIGH (ref 0–200)
HDL: 54 mg/dL (ref >39)
LDL Cholesterol: 94 mg/dL (ref 0–99)
Total CHOL/HDL Ratio: 4.1 Ratio
Triglycerides: 363 mg/dL — ABNORMAL HIGH (ref <150)
VLDL: 73 mg/dL — ABNORMAL HIGH (ref 0–40)

## 2013-08-27 LAB — CBC
HEMATOCRIT: 43.5 % (ref 36.0–46.0)
HEMOGLOBIN: 14.6 g/dL (ref 12.0–15.0)
MCH: 28.6 pg (ref 26.0–34.0)
MCHC: 33.6 g/dL (ref 30.0–36.0)
MCV: 85.3 fL (ref 78.0–100.0)
Platelets: 322 10*3/uL (ref 150–400)
RBC: 5.1 MIL/uL (ref 3.87–5.11)
RDW: 13.7 % (ref 11.5–15.5)
WBC: 7 10*3/uL (ref 4.0–10.5)

## 2013-08-27 LAB — POCT GLYCOSYLATED HEMOGLOBIN (HGB A1C): HEMOGLOBIN A1C: 9.8

## 2013-08-27 MED ORDER — SITAGLIPTIN PHOSPHATE 100 MG PO TABS: 100.0000 mg | ORAL_TABLET | Freq: Every day | ORAL | Status: DC

## 2013-08-27 NOTE — Progress Notes (Signed)
Subjective:  This chart was scribed for Wardell Honour, MD by Ladene Artist, ED Scribe. The patient was seen in room 24. Patient's care was started at 10:56 AM.   Patient ID: [name], female    DOB: 1965-10-11, 48 y.o.   MRN: [id number]  08/27/2013  Follow-up, Diabetes, Hyperlipidemia and Hypertension  HPI HPI Comments: JAILANI HOGANS is a 48 y.o. female who presents to the [facility] for three month follow-up. Pt has not checked her glucose. She states that she is taking her medications as prescribed. She takes Xanax nightly with good sleep. She takes 1 tablet of Lipitor daily. She has not had to use Qvar as much. Pt is still taking Gabapentin for shoulder pain; her R carpal tunnel syndrome pain is now resolved s/p release surgically. She has not taken Losartan for a while since her BP has been good. She takes 25 mg Hydrochlorothiazide. Pt states that she is climbing steps at work since the elevator is broken. She reports mild cough over the past few days. She denies chest pain, SOB.  Pt reports surgery on R wrist April 16th for carpal tunnel syndrome. She reports soreness where she was cut but states that pain has resolved for the most part.   Pt reports that she is very emotional. She denies family counseling. She states that she has not had time. Pt expresses concerns about her mother's cyst. She states that her mother is unable to get out of a car now and has experienced decreased urine output over the past few weeks. They plan to follow-up with specialist.   Pt states that Al lost his father, age 102 y.o on July 14, 2013 from Parkinson's disease. She states that her family is holding together. She states that her husband has not let go yet, which worries her. She states that she took him to the beach for a day with her grandchildren. Pt states that her youngest has staff infection in her head. She is home for the summer visiting her grandmother. Alyse Low is still  having a lot of pain, emotionally she is doing well with the assistance of the therapist. She has Skype sessions while in [place]. She was not granted [employer] but has been taken in by [facility] through people she knew from [place]. Her daughter-in-law is expecting a child.   Last mammogram at [facility] in March that was normal. She also states that she had her eyes checked.   Review of Systems  Constitutional: Negative for fever, chills, diaphoresis and fatigue.  HENT: Negative for congestion, ear pain, postnasal drip and rhinorrhea.   Respiratory: Positive for cough (mild). Negative for shortness of breath.   Cardiovascular: Negative for chest pain, palpitations and leg swelling.  Gastrointestinal: Negative for nausea, vomiting, abdominal pain, diarrhea and constipation.  Endocrine: Negative for polydipsia, polyphagia and polyuria.  Musculoskeletal: Positive for arthralgias.  Skin: Negative for color change, pallor, rash and wound.  Neurological: Negative for dizziness, tremors, seizures, syncope, facial asymmetry, speech difficulty, weakness, light-headedness, numbness and headaches.  Psychiatric/Behavioral: Negative for suicidal ideas, sleep disturbance, self-injury and dysphoric mood. The patient is nervous/anxious.    Past Medical History  Diagnosis Date  . Diabetes mellitus   . Hypertension   . Anemia   . Migraines   . Thyroid disease   . Colon polyp   . IBS (irritable bowel syndrome)   . Fatigue   . Night sweats   . Lump  in female breast   . Asthma   . Sinus problem   . Reflux   . Methicillin resistant Staphylococcus aureus in conditions classified elsewhere and of unspecified site   . Paresthesias   . Migraine, unspecified, without mention of intractable migraine without mention of status migrainosus   . Unspecified acute reaction to stress   . Nonspecific abnormal results of thyroid function study   . Essential hypertension, benign   .  Personal history of colonic polyps   . Pure hypercholesterolemia   . Type II or unspecified type diabetes mellitus without mention of complication, not stated as uncontrolled   . Morbid obesity   . Other B-complex deficiencies   . Edema   . Unspecified sleep apnea   . Symptomatic states associated with artificial menopause   . Allergy   . Arthritis    Past Surgical History  Procedure Laterality Date  . Cholecystectomy  03/1989  . Abdominal hysterectomy  1997    complete in 1997, partial was in 1995  . Knee surgery  2006  . Laparoscopic gastric banding  05/2009  . Cesarean section  1987  and 1989  . Abcess removal  1997    MRSA  . Tonsillectomy and adenoidectomy  1974  . Tubal ligation    . Colonoscopy  03/21/2011    Normal.  [facility].  . Carpal tunnel release r  07/03/13    [facility]    Allergies  Allergen Reactions  . Amoxicillin Shortness Of Breath and Rash  . Levofloxacin Shortness Of Breath and Rash  . Meloxicam Hives and Rash  . Vancomycin Anaphylaxis  . Ace Inhibitors Cough  . Codeine   . Meperidine Hcl   . Oxycodone-Acetaminophen   . Oxycodone-Acetaminophen   . Penicillins Swelling and Rash  . Tequin [Gatifloxacin] Rash   Current Outpatient Prescriptions  Medication Sig Dispense Refill  . albuterol (PROVENTIL HFA;VENTOLIN HFA) 108 (90 BASE) MCG/ACT inhaler Inhale 2 puffs into the lungs every 6 (six) hours as needed for wheezing.  1 Inhaler  1  . ALPRAZolam (XANAX) 0.5 MG tablet Take 1 tablet (0.5 mg total) by mouth at bedtime as needed for anxiety.  30 tablet  3  . atorvastatin (LIPITOR) 20 MG tablet Take 1 tablet (20 mg total) by mouth daily.  30 tablet  5  . azelastine (ASTELIN) 137 MCG/SPRAY nasal spray Place 1 spray into the nose 2 (two) times daily. Use in each nostril as directed  30 mL  11  . beclomethasone (QVAR) 80 MCG/ACT inhaler Inhale 2 puffs into the lungs every 12 (twelve) hours as needed.  1 Inhaler  11  . CALCIUM PO Take by mouth 3 (three) times  daily.        . Cetirizine HCl (ZYRTEC PO) Take by mouth daily.        [name] Fexofenadine HCl (ALLEGRA PO) Take by mouth.        [name] FLUoxetine (PROZAC) 20 MG tablet Take 1 tablet (20 mg total) by mouth daily.  30 tablet  5  . fluticasone (FLONASE) 50 MCG/ACT nasal spray Place 2 sprays into the nose daily as needed.  16 g  11  . gabapentin (NEURONTIN) 300 MG capsule One pill at bedtime for three days, then increase to one pill twice daily x 3 days, then increase to one pill tid PRN pain  90 capsule  3  . glipiZIDE-metformin (METAGLIP) 2.5-500 MG per tablet Take 2 tablets by mouth 2 (two) times daily before a meal.  120  tablet  11  . hydrochlorothiazide (HYDRODIURIL) 25 MG tablet Take 1 tablet (25 mg total) by mouth daily.  30 tablet  11  . ketoconazole (NIZORAL) 2 % cream Apply topically 2 (two) times daily.  60 g  0  . levalbuterol (XOPENEX) 1.25 MG/3ML nebulizer solution Take 1.25 mg by nebulization daily as needed.      [name] losartan (COZAAR) 100 MG tablet Take 1 tablet (100 mg total) by mouth daily.  30 tablet  11  . Multiple Vitamin (MULTIVITAMIN PO) Take by mouth daily.        [name] nystatin (MYCOSTATIN) powder Apply topically 2 (two) times daily.  60 g  5  . nystatin-triamcinolone (MYCOLOG II) cream Apply topically 2 (two) times daily.  60 g  3  . ondansetron (ZOFRAN ODT) 4 MG disintegrating tablet Take 1 tablet (4 mg total) by mouth every 8 (eight) hours as needed for nausea.  10 tablet  0  . cyclobenzaprine (FLEXERIL) 5 MG tablet One at bedtime for shoulder pain  30 tablet  1  . sitaGLIPtin (JANUVIA) 100 MG tablet Take 1 tablet (100 mg total) by mouth daily.  30 tablet  11   Current Facility-Administered Medications  Medication Dose Route Frequency Provider Last Rate Last Dose  . hepatitis B vac recombinant (RECOMBIVAX) injection 5 mcg  0.5 mL Intramuscular Once Wardell Honour, MD           Objective:    Triage Vitals: BP 131/84  Pulse 100  Temp(Src) 98.1 F (36.7 C) (Oral)  Resp 18  Ht 5'  6" (1.676 m)  Wt 289 lb 6.4 oz (131.271 kg)  BMI 46.73 kg/m2  SpO2 99% Physical Exam  Nursing note and vitals reviewed. Constitutional: She is oriented to person, place, and time. She appears well-developed and well-nourished. No distress.  HENT:  Head: Normocephalic and atraumatic.  Right Ear: External ear normal.  Left Ear: External ear normal.  Nose: Nose normal.  Mouth/Throat: Oropharynx is clear and moist.  Eyes: Conjunctivae and EOM are normal. Pupils are equal, round, and reactive to light.  Neck: Normal range of motion. Neck supple. Carotid bruit is not present. No thyromegaly present.  Cardiovascular: Normal rate, regular rhythm, normal heart sounds and intact distal pulses.  Exam reveals no gallop and no friction rub.   No murmur heard. Pulmonary/Chest: Effort normal and breath sounds normal. She has no wheezes. She has no rales.  Musculoskeletal: Normal range of motion.  Lymphadenopathy:    She has no cervical adenopathy.  Neurological: She is alert and oriented to person, place, and time. No cranial nerve deficit.  Skin: Skin is warm and dry. No rash noted. She is not diaphoretic. No erythema. No pallor.  Psychiatric: She has a normal mood and affect. Her behavior is normal.   Results for orders placed in visit on 08/27/13  CBC      Result Value Ref Range   WBC 7.0  4.0 - 10.5 K/uL   RBC 5.10  3.87 - 5.11 MIL/uL   Hemoglobin 14.6  12.0 - 15.0 g/dL   HCT 43.5  36.0 - 46.0 %   MCV 85.3  78.0 - 100.0 fL   MCH 28.6  26.0 - 34.0 pg   MCHC 33.6  30.0 - 36.0 g/dL   RDW 13.7  11.5 - 15.5 %   Platelets 322  150 - 400 K/uL  COMPREHENSIVE METABOLIC PANEL      Result Value Ref Range   Sodium 137  135 -  145 mEq/L   Potassium 4.4  3.5 - 5.3 mEq/L   Chloride 98  96 - 112 mEq/L   CO2 27  19 - 32 mEq/L   Glucose, Bld 256 (*) 70 - 99 mg/dL   BUN 15  6 - 23 mg/dL   Creat 0.79  0.50 - 1.10 mg/dL   Total Bilirubin 0.6  0.2 - 1.2 mg/dL   Alkaline Phosphatase 53  39 - 117 U/L    AST 34  0 - 37 U/L   ALT 40 (*) 0 - 35 U/L   Total Protein 6.7  6.0 - 8.3 g/dL   Albumin 4.2  3.5 - 5.2 g/dL   Calcium 9.9  8.4 - 10.5 mg/dL  LIPID PANEL      Result Value Ref Range   Cholesterol 221 (*) 0 - 200 mg/dL   Triglycerides 363 (*) <150 mg/dL   HDL 54  >39 mg/dL   Total CHOL/HDL Ratio 4.1     VLDL 73 (*) 0 - 40 mg/dL   LDL Cholesterol 94  0 - 99 mg/dL  POCT GLYCOSYLATED HEMOGLOBIN (HGB A1C)      Result Value Ref Range   Hemoglobin A1C 9.8         Assessment & Plan:   1. Pure hypercholesterolemia   2. Essential hypertension, benign   3. Diabetes   4. Anxiety    1.  Hypercholesterolemia:  Controlled except for elevated triglycerides; recommend weight loss, exercise, Fish oil. 2.  HTN: controlled; decreased HCTZ to 1/2 tablet daily and restart Losartan 100mg  1/2 tablet daily; obtain labs. 3.  DMII: uncontrolled; add Januvia 100mg  daily; continue Metaglip.  Obtain labs. 4. Anxiety with depression: stable; multiple family stressors; counseling provided. 5. Carpal  Tunnel Syndrome R>L: improved; s/p surgical release.  Meds ordered this encounter  Medications  . sitaGLIPtin (JANUVIA) 100 MG tablet    Sig: Take 1 tablet (100 mg total) by mouth daily.    Dispense:  30 tablet    Refill:  11    Return in about 3 months (around 11/27/2013) for complete physical examiniation.   I personally performed the services described in this documentation, which was scribed in my presence. The recorded information has been reviewed and is accurate.  Reginia Forts, M.D.  [facility] [address] [place], [place]  [phone] phone [phone] fax

## 2013-09-02 ENCOUNTER — Encounter: Payer: Self-pay | Admitting: Family Medicine

## 2013-11-26 ENCOUNTER — Encounter: Payer: BC Managed Care – PPO | Admitting: Family Medicine

## 2013-11-26 ENCOUNTER — Other Ambulatory Visit: Payer: Self-pay | Admitting: Family Medicine

## 2013-12-02 ENCOUNTER — Other Ambulatory Visit: Payer: Self-pay | Admitting: Family Medicine

## 2014-01-02 ENCOUNTER — Other Ambulatory Visit: Payer: Self-pay

## 2014-01-05 ENCOUNTER — Encounter: Payer: Self-pay | Admitting: Family Medicine

## 2014-01-06 ENCOUNTER — Telehealth: Payer: Self-pay | Admitting: Family Medicine

## 2014-01-06 MED ORDER — ALPRAZOLAM 0.5 MG PO TABS: 0.5000 mg | ORAL_TABLET | Freq: Every evening | ORAL | Status: DC | PRN

## 2014-01-06 NOTE — Telephone Encounter (Signed)
Please call in refill of Alprazolam as approved. 

## 2014-01-06 NOTE — Telephone Encounter (Signed)
Alprazolam called into [facility]

## 2014-01-12 ENCOUNTER — Ambulatory Visit (INDEPENDENT_AMBULATORY_CARE_PROVIDER_SITE_OTHER): Payer: BC Managed Care – PPO | Admitting: Family Medicine

## 2014-01-12 ENCOUNTER — Encounter: Payer: Self-pay | Admitting: Family Medicine

## 2014-01-12 VITALS — BP 139/67 | HR 94 | Temp 98.5°F | Resp 18 | Ht 66.25 in | Wt 287.6 lb

## 2014-01-12 DIAGNOSIS — J302 Other seasonal allergic rhinitis: Secondary | ICD-10-CM

## 2014-01-12 DIAGNOSIS — B3731 Acute candidiasis of vulva and vagina: Secondary | ICD-10-CM

## 2014-01-12 DIAGNOSIS — E7800 Pure hypercholesterolemia, unspecified: Secondary | ICD-10-CM

## 2014-01-12 DIAGNOSIS — Z0000 Encounter for general adult medical examination without abnormal findings: Secondary | ICD-10-CM

## 2014-01-12 DIAGNOSIS — F411 Generalized anxiety disorder: Secondary | ICD-10-CM

## 2014-01-12 DIAGNOSIS — J0101 Acute recurrent maxillary sinusitis: Secondary | ICD-10-CM

## 2014-01-12 DIAGNOSIS — E119 Type 2 diabetes mellitus without complications: Secondary | ICD-10-CM

## 2014-01-12 DIAGNOSIS — B373 Candidiasis of vulva and vagina: Secondary | ICD-10-CM

## 2014-01-12 DIAGNOSIS — Z23 Encounter for immunization: Secondary | ICD-10-CM

## 2014-01-12 DIAGNOSIS — I10 Essential (primary) hypertension: Secondary | ICD-10-CM

## 2014-01-12 LAB — CBC WITH DIFFERENTIAL/PLATELET
BASOS ABS: 0.1 10*3/uL (ref 0.0–0.1)
Basophils Relative: 1 % (ref 0–1)
EOS ABS: 0.1 10*3/uL (ref 0.0–0.7)
Eosinophils Relative: 2 % (ref 0–5)
HCT: 40.8 % (ref 36.0–46.0)
HEMOGLOBIN: 13.5 g/dL (ref 12.0–15.0)
Lymphocytes Relative: 42 % (ref 12–46)
Lymphs Abs: 2.4 10*3/uL (ref 0.7–4.0)
MCH: 28.5 pg (ref 26.0–34.0)
MCHC: 33.1 g/dL (ref 30.0–36.0)
MCV: 86.1 fL (ref 78.0–100.0)
MONOS PCT: 7 % (ref 3–12)
Monocytes Absolute: 0.4 10*3/uL (ref 0.1–1.0)
NEUTROS ABS: 2.7 10*3/uL (ref 1.7–7.7)
NEUTROS PCT: 48 % (ref 43–77)
PLATELETS: 278 10*3/uL (ref 150–400)
RBC: 4.74 MIL/uL (ref 3.87–5.11)
RDW: 13.9 % (ref 11.5–15.5)
WBC: 5.7 10*3/uL (ref 4.0–10.5)

## 2014-01-12 LAB — COMPLETE METABOLIC PANEL WITH GFR
ALBUMIN: 3.9 g/dL (ref 3.5–5.2)
ALK PHOS: 49 U/L (ref 39–117)
ALT: 38 U/L — AB (ref 0–35)
AST: 33 U/L (ref 0–37)
BILIRUBIN TOTAL: 0.4 mg/dL (ref 0.2–1.2)
BUN: 13 mg/dL (ref 6–23)
CO2: 25 mEq/L (ref 19–32)
Calcium: 9 mg/dL (ref 8.4–10.5)
Chloride: 98 mEq/L (ref 96–112)
Creat: 0.73 mg/dL (ref 0.50–1.10)
GFR, Est African American: >89 mL/min
Glucose, Bld: 250 mg/dL — ABNORMAL HIGH (ref 70–99)
POTASSIUM: 3.4 meq/L — AB (ref 3.5–5.3)
SODIUM: 136 meq/L (ref 135–145)
Total Protein: 6.5 g/dL (ref 6.0–8.3)

## 2014-01-12 LAB — POCT URINALYSIS DIPSTICK
Bilirubin, UA: NEGATIVE
Blood, UA: NEGATIVE
GLUCOSE UA: 500
KETONES UA: NEGATIVE
Leukocytes, UA: NEGATIVE
Nitrite, UA: NEGATIVE
Protein, UA: NEGATIVE
UROBILINOGEN UA: 0.2
pH, UA: 5.5

## 2014-01-12 LAB — TSH: TSH: 4.174 u[IU]/mL (ref 0.350–4.500)

## 2014-01-12 LAB — LIPID PANEL
CHOLESTEROL: 277 mg/dL — AB (ref 0–200)
HDL: 45 mg/dL (ref >39)
TRIGLYCERIDES: 421 mg/dL — AB (ref <150)
Total CHOL/HDL Ratio: 6.2 Ratio

## 2014-01-12 MED ORDER — LOSARTAN POTASSIUM 100 MG PO TABS: 50.0000 mg | ORAL_TABLET | Freq: Every day | ORAL | Status: DC

## 2014-01-12 MED ORDER — ALPRAZOLAM 0.5 MG PO TABS: 0.5000 mg | ORAL_TABLET | Freq: Every evening | ORAL | Status: DC | PRN

## 2014-01-12 MED ORDER — ATORVASTATIN CALCIUM 20 MG PO TABS: 20.0000 mg | ORAL_TABLET | Freq: Every day | ORAL | Status: DC

## 2014-01-12 MED ORDER — HYDROCHLOROTHIAZIDE 25 MG PO TABS: 12.5000 mg | ORAL_TABLET | Freq: Every day | ORAL | Status: DC

## 2014-01-12 MED ORDER — AZITHROMYCIN 250 MG PO TABS: ORAL_TABLET | ORAL | Status: DC

## 2014-01-12 MED ORDER — GABAPENTIN 300 MG PO CAPS: ORAL_CAPSULE | ORAL | Status: DC

## 2014-01-12 MED ORDER — FLUOXETINE HCL 20 MG PO TABS: 20.0000 mg | ORAL_TABLET | Freq: Every day | ORAL | Status: DC

## 2014-01-12 MED ORDER — FLUCONAZOLE 100 MG PO TABS: ORAL_TABLET | ORAL | Status: DC

## 2014-01-12 MED ORDER — FLUTICASONE PROPIONATE 50 MCG/ACT NA SUSP: 2.0000 | Freq: Every day | NASAL | Status: DC

## 2014-01-12 MED ORDER — GLIPIZIDE-METFORMIN HCL 2.5-500 MG PO TABS: 2.0000 | ORAL_TABLET | Freq: Two times a day (BID) | ORAL | Status: DC

## 2014-01-12 MED ORDER — AZELASTINE HCL 0.1 % NA SOLN: 2.0000 | Freq: Two times a day (BID) | NASAL | Status: DC

## 2014-01-12 NOTE — Progress Notes (Signed)
   Subjective:    Patient ID: [name], female    DOB: September 05, 1965, 48 y.o.   MRN: [id number]  HPI    Review of Systems  Constitutional: Negative.   HENT: Positive for congestion.   Eyes: Negative.   Respiratory: Positive for cough.   Gastrointestinal: Negative.   Endocrine: Negative.   Genitourinary: Positive for pelvic pain.  Musculoskeletal: Positive for neck pain.  Skin: Negative.   Allergic/Immunologic: Negative.   Neurological: Positive for headaches.  Hematological: Negative.   Psychiatric/Behavioral: Negative.        Objective:   Physical Exam        Assessment & Plan:

## 2014-01-12 NOTE — Patient Instructions (Signed)

## 2014-01-12 NOTE — Progress Notes (Signed)
Subjective:    Patient ID: [name], female    DOB: 1965/06/08, 48 y.o.   MRN: [id number] This chart was scribed for Reginia Forts, MD by Marti Sleigh, Medical Scribe. This patient was seen in Room 21 and the patient's care was started at 1:30 PM.  HPI HPI Comments: [name] is a 48 y.o. female with a medical hx of DM, HLD, HTN, as well as a surgical hx of carpel tunnel surgery who presents to [facility] needing a annual exam. Pt's mammogram was completed on 06/02/13, and it was normal. Colonoscopy was completed in 2013. Tenanus shot on 2011. Pt received a pneumonia vaccine in 2007. Pt received Hepititis C series last year. Pt needs an eye exam, and pt's optometrist is  Dr Lorelei Pont at [place]. Pt had a past surgical hx of carpal tunnel surgery on her right hand.   Pt takes xanex every night. Pt has not needed to use her albuterol recently. Pt has not been medically compliant with her lipitor medication, taking it only three times weekly. Pt uses Flonase daily. Pt has not had to use her Qvar inhaler recently. Pt takes zyrtech nightly. Pt has not needed to use her flexeril medication. Pt takes a calcium supplement daily. Pt takes fexofenadine daily in the morning. Pt takes two hydrochlorothiazide in the morning and evening. Pt's sister has no health problems. Pt's mother had a recent hip replacement. Pt's brother has DM. Pt has been happily married for 28 years. Pt has two children. Pt lives with her husband and her stepmother. Pt works at [employer], and loves her job. 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 Pt drinks one drink per month. Pt tries to exercise regularly.  Pt also endorses productive cough, with green and yellow sputum that started more than a month ago. Pt endorses associated ear pain. Pt has taken mucinex with some relief.       Review of Systems  Constitutional: Negative for fever, chills, diaphoresis, activity change, appetite change, fatigue and unexpected weight change.  HENT:  Negative for congestion, dental problem, drooling, ear discharge, ear pain, facial swelling, hearing loss, mouth sores, nosebleeds, postnasal drip, rhinorrhea, sinus pressure, sneezing, sore throat, tinnitus, trouble swallowing and voice change.   Eyes: Negative for photophobia, pain, discharge, redness, itching and visual disturbance.  Respiratory: Positive for cough and wheezing (Mild). Negative for apnea, choking, chest tightness, shortness of breath and stridor.   Cardiovascular: Negative for chest pain, palpitations and leg swelling.  Gastrointestinal: Negative for nausea, vomiting, abdominal pain, diarrhea, constipation, blood in stool, abdominal distention, anal bleeding and rectal pain.  Endocrine: Negative for cold intolerance, heat intolerance, polydipsia, polyphagia and polyuria.  Genitourinary: Negative for dysuria, urgency, frequency, hematuria, flank pain, decreased urine volume, vaginal bleeding, vaginal discharge, enuresis, difficulty urinating, genital sores, vaginal pain, menstrual problem, pelvic pain and dyspareunia.  Musculoskeletal: Negative for arthralgias, back pain, gait problem, joint swelling, myalgias, neck pain and neck stiffness.  Skin: Positive for rash. Negative for color change, pallor and wound.  Allergic/Immunologic: Negative for environmental allergies, food allergies and immunocompromised state.  Neurological: Negative for dizziness, tremors, seizures, syncope, facial asymmetry, speech difficulty, weakness, light-headedness, numbness and headaches.  Hematological: Negative for adenopathy. Does not bruise/bleed easily.  Psychiatric/Behavioral: Negative for suicidal ideas, hallucinations, behavioral problems, confusion, sleep disturbance, self-injury, dysphoric mood, decreased concentration and agitation. The patient is not nervous/anxious and is not hyperactive.        Objective:   Physical Exam  Nursing note and vitals reviewed. Constitutional:  She is oriented  to person, place, and time. She appears well-developed and well-nourished. No distress.  HENT:  Head: Normocephalic and atraumatic.  Right Ear: External ear normal.  Left Ear: External ear normal.  Nose: Nose normal.  Mouth/Throat: Oropharynx is clear and moist.  TTP frontal and maxillary sinuses bilaterally.  Eyes: Conjunctivae and EOM are normal. Pupils are equal, round, and reactive to light.  Neck: Normal range of motion and full passive range of motion without pain. Neck supple. No JVD present. Carotid bruit is not present. No thyromegaly present.  No carotid bruit.  Cardiovascular: Normal rate, regular rhythm, normal heart sounds and intact distal pulses.  Exam reveals no gallop and no friction rub.   No murmur heard. HR 94.   Pulmonary/Chest: Effort normal and breath sounds normal. No respiratory distress. She has no wheezes. She has no rales.  Abdominal: Soft. Bowel sounds are normal. She exhibits no distension and no mass. There is no tenderness. There is no rebound and no guarding.  Genitourinary: No breast swelling, tenderness, discharge or bleeding.  Diffuse erythema on external genitalia.   Musculoskeletal: She exhibits no edema and no tenderness.       Right shoulder: Normal.       Left shoulder: Normal.       Cervical back: Normal.  Pt denies numbness or tingling in hands or feet. Pt has no calluses on feet.  Lymphadenopathy:    She has no cervical adenopathy.  Neurological: She is alert and oriented to person, place, and time. She has normal reflexes. No cranial nerve deficit. She exhibits normal muscle tone. Coordination normal.  Skin: Skin is warm and dry. No rash noted. She is not diaphoretic. No erythema. No pallor.  Psychiatric: She has a normal mood and affect. Her behavior is normal. Judgment and thought content normal.          Assessment & Plan:  Need for prophylactic vaccination and inoculation against influenza - Plan: Flu Vaccine QUAD 36+ mos  IM  Routine general medical examination at a health care facility  Essential hypertension, benign - Plan: CBC with Differential, COMPLETE METABOLIC PANEL WITH GFR, TSH, POCT urinalysis dipstick, EKG 12-Lead, losartan (COZAAR) 100 MG tablet, hydrochlorothiazide (HYDRODIURIL) 25 MG tablet  Pure hypercholesterolemia - Plan: COMPLETE METABOLIC PANEL WITH GFR, Lipid panel, TSH, atorvastatin (LIPITOR) 20 MG tablet  Diabetes mellitus without complication - Plan: HM Diabetes Foot Exam, CBC with Differential, COMPLETE METABOLIC PANEL WITH GFR, Hemoglobin A1c, Microalbumin, urine, glipiZIDE-metformin (METAGLIP) 2.5-500 MG per tablet, Insulin Glargine (LANTUS SOLOSTAR) 100 UNIT/ML Solostar Pen  Vulvovaginal candidiasis - Plan: fluconazole (DIFLUCAN) 100 MG tablet  Acute recurrent maxillary sinusitis - Plan: CBC with Differential, azithromycin (ZITHROMAX) 250 MG tablet  Morbid obesity  Other seasonal allergic rhinitis - Plan: azelastine (ASTELIN) 0.1 % nasal spray, fluticasone (FLONASE) 50 MCG/ACT nasal spray  Generalized anxiety disorder - Plan: ALPRAZolam (XANAX) 0.5 MG tablet, FLUoxetine (PROZAC) 20 MG tablet  Meds ordered this encounter  Medications  . fluconazole (DIFLUCAN) 100 MG tablet    Sig: Two tablets daily x 1 day then one tablet daily x 6 days    Dispense:  8 tablet    Refill:  0  . ALPRAZolam (XANAX) 0.5 MG tablet    Sig: Take 1 tablet (0.5 mg total) by mouth at bedtime as needed for anxiety.    Dispense:  30 tablet    Refill:  5  . losartan (COZAAR) 100 MG tablet    Sig: Take 0.5 tablets (50 mg  total) by mouth daily.    Dispense:  90 tablet    Refill:  1  . atorvastatin (LIPITOR) 20 MG tablet    Sig: Take 1 tablet (20 mg total) by mouth daily.    Dispense:  90 tablet    Refill:  3  . azelastine (ASTELIN) 0.1 % nasal spray    Sig: Place 2 sprays into both nostrils 2 (two) times daily. Use in each nostril as directed    Dispense:  30 mL    Refill:  11  . FLUoxetine (PROZAC)  20 MG tablet    Sig: Take 1 tablet (20 mg total) by mouth daily.    Dispense:  90 tablet    Refill:  11  . fluticasone (FLONASE) 50 MCG/ACT nasal spray    Sig: Place 2 sprays into both nostrils daily.    Dispense:  16 g    Refill:  11  . gabapentin (NEURONTIN) 300 MG capsule    Sig: One pill at bedtime for three days, then increase to one pill twice daily x 3 days, then increase to one pill tid PRN pain    Dispense:  90 capsule    Refill:  5  . glipiZIDE-metformin (METAGLIP) 2.5-500 MG per tablet    Sig: Take 2 tablets by mouth 2 (two) times daily before a meal.    Dispense:  360 tablet    Refill:  3  . hydrochlorothiazide (HYDRODIURIL) 25 MG tablet    Sig: Take 0.5 tablets (12.5 mg total) by mouth daily.    Dispense:  90 tablet    Refill:  1  . azithromycin (ZITHROMAX) 250 MG tablet    Sig: Two tablets daily x 5 days    Dispense:  10 tablet    Refill:  0  . Insulin Glargine (LANTUS SOLOSTAR) 100 UNIT/ML Solostar Pen    Sig: Inject 10 Units into the skin daily at 10 pm.    Dispense:  15 mL    Refill:  11    I personally performed the services described in this documentation, which was scribed in my presence.  The recorded information has been reviewed and is accurate.  Reginia Forts, M.D.  [facility] [address] [place], [place]  [address] [phone] phone [phone] fax

## 2014-01-13 LAB — HEMOGLOBIN A1C
Hgb A1c MFr Bld: 11.7 % — ABNORMAL HIGH (ref <5.7)
Mean Plasma Glucose: 289 mg/dL — ABNORMAL HIGH (ref <117)

## 2014-01-13 LAB — MICROALBUMIN, URINE: Microalb, Ur: 0.2 mg/dL (ref <2.0)

## 2014-01-13 MED ORDER — INSULIN GLARGINE 100 UNIT/ML SOLOSTAR PEN: 10.0000 [IU] | PEN_INJECTOR | Freq: Every day | SUBCUTANEOUS | Status: DC

## 2014-02-04 ENCOUNTER — Encounter: Payer: Self-pay | Admitting: Family Medicine

## 2014-02-04 MED ORDER — CLARITHROMYCIN 500 MG PO TABS: 500.0000 mg | ORAL_TABLET | Freq: Two times a day (BID) | ORAL | Status: DC

## 2014-02-23 ENCOUNTER — Other Ambulatory Visit: Payer: Self-pay | Admitting: Family Medicine

## 2014-02-23 DIAGNOSIS — B3731 Acute candidiasis of vulva and vagina: Secondary | ICD-10-CM

## 2014-02-23 DIAGNOSIS — B373 Candidiasis of vulva and vagina: Secondary | ICD-10-CM

## 2014-02-24 MED ORDER — FLUCONAZOLE 150 MG PO TABS: 150.0000 mg | ORAL_TABLET | Freq: Once | ORAL | Status: DC

## 2014-04-13 ENCOUNTER — Ambulatory Visit: Payer: BC Managed Care – PPO | Admitting: Family Medicine

## 2014-04-22 ENCOUNTER — Ambulatory Visit: Payer: BC Managed Care – PPO | Admitting: Family Medicine

## 2014-05-27 ENCOUNTER — Encounter: Payer: Self-pay | Admitting: Family Medicine

## 2014-05-27 ENCOUNTER — Other Ambulatory Visit: Payer: Self-pay | Admitting: Family Medicine

## 2014-05-27 ENCOUNTER — Ambulatory Visit (INDEPENDENT_AMBULATORY_CARE_PROVIDER_SITE_OTHER): Payer: BC Managed Care – PPO | Admitting: Family Medicine

## 2014-05-27 VITALS — BP 156/85 | HR 99 | Temp 98.4°F | Resp 16 | Ht 66.0 in | Wt 297.0 lb

## 2014-05-27 DIAGNOSIS — R7989 Other specified abnormal findings of blood chemistry: Secondary | ICD-10-CM | POA: Diagnosis not present

## 2014-05-27 DIAGNOSIS — J4531 Mild persistent asthma with (acute) exacerbation: Secondary | ICD-10-CM | POA: Diagnosis not present

## 2014-05-27 DIAGNOSIS — E785 Hyperlipidemia, unspecified: Secondary | ICD-10-CM | POA: Diagnosis not present

## 2014-05-27 DIAGNOSIS — I10 Essential (primary) hypertension: Secondary | ICD-10-CM | POA: Diagnosis not present

## 2014-05-27 DIAGNOSIS — L298 Other pruritus: Secondary | ICD-10-CM

## 2014-05-27 DIAGNOSIS — J22 Unspecified acute lower respiratory infection: Secondary | ICD-10-CM

## 2014-05-27 DIAGNOSIS — N760 Acute vaginitis: Secondary | ICD-10-CM

## 2014-05-27 DIAGNOSIS — Z9884 Bariatric surgery status: Secondary | ICD-10-CM

## 2014-05-27 DIAGNOSIS — E119 Type 2 diabetes mellitus without complications: Secondary | ICD-10-CM | POA: Diagnosis not present

## 2014-05-27 DIAGNOSIS — J988 Other specified respiratory disorders: Secondary | ICD-10-CM | POA: Diagnosis not present

## 2014-05-27 DIAGNOSIS — F419 Anxiety disorder, unspecified: Secondary | ICD-10-CM | POA: Diagnosis not present

## 2014-05-27 DIAGNOSIS — B373 Candidiasis of vulva and vagina: Secondary | ICD-10-CM | POA: Diagnosis not present

## 2014-05-27 DIAGNOSIS — R945 Abnormal results of liver function studies: Secondary | ICD-10-CM

## 2014-05-27 DIAGNOSIS — B9689 Other specified bacterial agents as the cause of diseases classified elsewhere: Secondary | ICD-10-CM

## 2014-05-27 DIAGNOSIS — A499 Bacterial infection, unspecified: Secondary | ICD-10-CM

## 2014-05-27 DIAGNOSIS — B3731 Acute candidiasis of vulva and vagina: Secondary | ICD-10-CM

## 2014-05-27 DIAGNOSIS — N898 Other specified noninflammatory disorders of vagina: Secondary | ICD-10-CM

## 2014-05-27 DIAGNOSIS — F411 Generalized anxiety disorder: Secondary | ICD-10-CM | POA: Diagnosis not present

## 2014-05-27 LAB — POCT WET PREP WITH KOH
KOH PREP POC: POSITIVE
RBC WET PREP PER HPF POC: NEGATIVE
Trichomonas, UA: NEGATIVE

## 2014-05-27 LAB — CBC WITH DIFFERENTIAL/PLATELET
BASOS ABS: 0.1 10*3/uL (ref 0.0–0.1)
BASOS PCT: 1 % (ref 0–1)
Eosinophils Absolute: 0.1 10*3/uL (ref 0.0–0.7)
Eosinophils Relative: 2 % (ref 0–5)
HEMATOCRIT: 39.7 % (ref 36.0–46.0)
Hemoglobin: 13 g/dL (ref 12.0–15.0)
Lymphocytes Relative: 34 % (ref 12–46)
Lymphs Abs: 2 10*3/uL (ref 0.7–4.0)
MCH: 28.3 pg (ref 26.0–34.0)
MCHC: 32.7 g/dL (ref 30.0–36.0)
MCV: 86.3 fL (ref 78.0–100.0)
MPV: 10.3 fL (ref 8.6–12.4)
Monocytes Absolute: 0.4 10*3/uL (ref 0.1–1.0)
Monocytes Relative: 7 % (ref 3–12)
NEUTROS PCT: 56 % (ref 43–77)
Neutro Abs: 3.2 10*3/uL (ref 1.7–7.7)
Platelets: 312 10*3/uL (ref 150–400)
RBC: 4.6 MIL/uL (ref 3.87–5.11)
RDW: 13.7 % (ref 11.5–15.5)
WBC: 5.8 10*3/uL (ref 4.0–10.5)

## 2014-05-27 LAB — POCT GLYCOSYLATED HEMOGLOBIN (HGB A1C): HEMOGLOBIN A1C: 10.9

## 2014-05-27 LAB — LIPID PANEL
CHOL/HDL RATIO: 7.8 ratio
CHOLESTEROL: 326 mg/dL — AB (ref 0–200)
HDL: 42 mg/dL — ABNORMAL LOW (ref >=46)
Triglycerides: 780 mg/dL — ABNORMAL HIGH (ref <150)

## 2014-05-27 LAB — COMPREHENSIVE METABOLIC PANEL
ALT: 58 U/L — AB (ref 0–35)
AST: 56 U/L — ABNORMAL HIGH (ref 0–37)
Albumin: 3.5 g/dL (ref 3.5–5.2)
Alkaline Phosphatase: 53 U/L (ref 39–117)
BUN: 14 mg/dL (ref 6–23)
CHLORIDE: 99 meq/L (ref 96–112)
CO2: 24 mEq/L (ref 19–32)
CREATININE: 0.73 mg/dL (ref 0.50–1.10)
Calcium: 9.5 mg/dL (ref 8.4–10.5)
Glucose, Bld: 238 mg/dL — ABNORMAL HIGH (ref 70–99)
Potassium: 4.5 mEq/L (ref 3.5–5.3)
Sodium: 138 mEq/L (ref 135–145)
TOTAL PROTEIN: 6.3 g/dL (ref 6.0–8.3)
Total Bilirubin: 0.4 mg/dL (ref 0.2–1.2)

## 2014-05-27 LAB — CK: Total CK: 46 U/L (ref 7–177)

## 2014-05-27 MED ORDER — INSULIN GLARGINE 100 UNIT/ML SOLOSTAR PEN: 20.0000 [IU] | PEN_INJECTOR | Freq: Every day | SUBCUTANEOUS | Status: DC

## 2014-05-27 MED ORDER — ALPRAZOLAM 0.5 MG PO TABS: 0.5000 mg | ORAL_TABLET | Freq: Every evening | ORAL | Status: DC | PRN

## 2014-05-27 MED ORDER — AZITHROMYCIN 250 MG PO TABS: ORAL_TABLET | ORAL | Status: DC

## 2014-05-27 MED ORDER — FLUCONAZOLE 100 MG PO TABS: ORAL_TABLET | ORAL | Status: DC

## 2014-05-27 MED ORDER — FLUOXETINE HCL 20 MG PO TABS: 40.0000 mg | ORAL_TABLET | Freq: Every day | ORAL | Status: DC

## 2014-05-27 MED ORDER — METRONIDAZOLE 0.75 % VA GEL: 1.0000 | Freq: Two times a day (BID) | VAGINAL | Status: DC

## 2014-05-27 NOTE — Progress Notes (Signed)
Subjective:    Patient ID: [name], female    DOB: April 20, 1965, 49 y.o.   MRN: [id number]  05/27/2014  Follow-up; Diabetes; Hyperlipidemia; and Hypertension   HPI This 49 y.o. female presents for five month follow-up:  1. DMII: management changes made at last visit included restarting Lantus 10 units daily.  HgbA1c of 11.1 in 12/2013.  Patient reports good compliance with medication, good tolerance to medication, and good symptom control.  Gaining weight. Not checking sugars. Lantus 10-15 units qhs. Metaglip 2 bid.  Januvia daily.  2.  Hyperlipidemia:  LDL not calculated due to triglycerides of 421 in 12/2013.  Taking Lipitor sporadically.    3. HTN: Patient reports good compliance with medication, good tolerance to medication, and good symptom control.  No changes to management made at last visit.   Taking Losartan and HCTZ 1/2 daily of each.     BP 139/67 in 12/2013.  4. Anxiety:  Patient reports good compliance with medication, good tolerance to medication, and good symptom control.  Excessive worry about mother and daughter.  Daughter with Crohn's disease. Husband with CHF diastolic. Taking Fluoxetine 20mg  daily.  Taking Xanax qhs scheduled for insomnia.   Moving and down-sizing.    5. Cough: intermittently productive in morning.  Onset one week ago.  Husband is sick.  No fever/chills/sweats.  +Ha.  +ear pain R>L; no sore throat.  No rhinorrhea; mild congestion nasal.  Using Mucinex DM for cough.  No SOB.  Mild wheezing especially at night.  Using Albuterol qd; QVAR bid.   Taking Zyrtec, Flonase, Allegra, and Astelin.    6. Vaginal itching with pelvic pain: onset three weeks ago.  Concerned about bacterial infection.  Shooting pains occur frequently during the day with sitting.  Mild vaginal discharge with fishy odor.  No dysuria, frequency, urgency, hematuria.  Lower back pain.  No abdominal pain, nausea, vomiting, constipation, diarrhea.    Review of Systems    Constitutional: Negative for fever, chills, diaphoresis and fatigue.  HENT: Positive for congestion and ear pain. Negative for postnasal drip, rhinorrhea, sinus pressure and sore throat.   Eyes: Negative for visual disturbance.  Respiratory: Positive for cough and wheezing. Negative for shortness of breath.   Cardiovascular: Negative for chest pain, palpitations and leg swelling.  Gastrointestinal: Negative for nausea, vomiting, abdominal pain, diarrhea and constipation.  Endocrine: Negative for cold intolerance, heat intolerance, polydipsia, polyphagia and polyuria.  Genitourinary: Positive for vaginal discharge, vaginal pain and pelvic pain. Negative for dysuria, urgency, frequency, hematuria, flank pain, vaginal bleeding and genital sores.  Neurological: Negative for dizziness, tremors, seizures, syncope, facial asymmetry, speech difficulty, weakness, light-headedness, numbness and headaches.  Psychiatric/Behavioral: Positive for sleep disturbance and dysphoric mood. Negative for suicidal ideas and self-injury. The patient is nervous/anxious.     Past Medical History  Diagnosis Date  . Diabetes mellitus   . Hypertension   . Anemia   . Migraines   . Thyroid disease   . Colon polyp   . IBS (irritable bowel syndrome)   . Fatigue   . Night sweats   . Lump in female breast   . Asthma   . Sinus problem   . Reflux   . Methicillin resistant Staphylococcus aureus in conditions classified elsewhere and of unspecified site   . Paresthesias   . Migraine, unspecified, without mention of intractable migraine without mention of status migrainosus   . Unspecified acute reaction to stress   . Nonspecific abnormal results of thyroid function study   .  Essential hypertension, benign   . Personal history of colonic polyps   . Pure hypercholesterolemia   . Type II or unspecified type diabetes mellitus without mention of complication, not stated as uncontrolled   . Morbid obesity   . Other  B-complex deficiencies   . Edema   . Unspecified sleep apnea   . Symptomatic states associated with artificial menopause   . Allergy   . Arthritis   . Anxiety    Past Surgical History  Procedure Laterality Date  . Cholecystectomy  03/1989  . Abdominal hysterectomy  1997    complete in 1997, partial was in 1995  . Knee surgery  2006  . Laparoscopic gastric banding  05/2009  . Cesarean section  1987  and 1989  . Abcess removal  1997    MRSA  . Tonsillectomy and adenoidectomy  1974  . Tubal ligation    . Colonoscopy  03/21/2011    Normal.  [facility].  . Carpal tunnel release r  07/03/13    [facility]   Allergies  Allergen Reactions  . Amoxicillin Shortness Of Breath and Rash  . Levofloxacin Shortness Of Breath and Rash  . Meloxicam Hives and Rash  . Vancomycin Anaphylaxis  . Ace Inhibitors Cough  . Codeine   . Meperidine Hcl   . Oxycodone-Acetaminophen   . Oxycodone-Acetaminophen   . Penicillins Swelling and Rash  . Tequin [Gatifloxacin] Rash   Current Outpatient Prescriptions  Medication Sig Dispense Refill  . albuterol (PROVENTIL HFA;VENTOLIN HFA) 108 (90 BASE) MCG/ACT inhaler Inhale 2 puffs into the lungs every 6 (six) hours as needed for wheezing. 1 Inhaler 1  . ALPRAZolam (XANAX) 0.5 MG tablet Take 1 tablet (0.5 mg total) by mouth at bedtime as needed for anxiety. 30 tablet 5  . atorvastatin (LIPITOR) 20 MG tablet Take 1 tablet (20 mg total) by mouth daily. 90 tablet 3  . azelastine (ASTELIN) 0.1 % nasal spray Place 2 sprays into both nostrils 2 (two) times daily. Use in each nostril as directed 30 mL 11  . beclomethasone (QVAR) 80 MCG/ACT inhaler Inhale 2 puffs into the lungs every 12 (twelve) hours as needed. 1 Inhaler 11  . CALCIUM PO Take by mouth 3 (three) times daily.      . Cetirizine HCl (ZYRTEC PO) Take by mouth daily.      . cyclobenzaprine (FLEXERIL) 5 MG tablet One at bedtime for shoulder pain 30 tablet 1  . Fexofenadine HCl (ALLEGRA PO) Take by mouth.        [name] FLUoxetine (PROZAC) 20 MG tablet Take 2 tablets (40 mg total) by mouth daily. 180 tablet 3  . fluticasone (FLONASE) 50 MCG/ACT nasal spray Place 2 sprays into both nostrils daily. 16 g 11  . gabapentin (NEURONTIN) 300 MG capsule One pill at bedtime for three days, then increase to one pill twice daily x 3 days, then increase to one pill tid PRN pain 90 capsule 5  . glipiZIDE-metformin (METAGLIP) 2.5-500 MG per tablet Take 2 tablets by mouth 2 (two) times daily before a meal. 360 tablet 3  . hydrochlorothiazide (HYDRODIURIL) 25 MG tablet Take 0.5 tablets (12.5 mg total) by mouth daily. 90 tablet 1  . Insulin Glargine (LANTUS SOLOSTAR) 100 UNIT/ML Solostar Pen Inject 20 Units into the skin daily at 10 pm. 15 mL 11  . ketoconazole (NIZORAL) 2 % cream Apply topically 2 (two) times daily. 60 g 0  . levalbuterol (XOPENEX) 1.25 MG/3ML nebulizer solution Take 1.25 mg by nebulization daily as needed.    [name]  losartan (COZAAR) 100 MG tablet Take 0.5 tablets (50 mg total) by mouth daily. 90 tablet 1  . Multiple Vitamin (MULTIVITAMIN PO) Take by mouth daily.      [name] nystatin (MYCOSTATIN) powder Apply topically 2 (two) times daily. 60 g 5  . nystatin-triamcinolone (MYCOLOG II) cream Apply topically 2 (two) times daily. 60 g 3  . ondansetron (ZOFRAN ODT) 4 MG disintegrating tablet Take 1 tablet (4 mg total) by mouth every 8 (eight) hours as needed for nausea. 10 tablet 0  . sitaGLIPtin (JANUVIA) 100 MG tablet Take 1 tablet (100 mg total) by mouth daily. 30 tablet 11  . azithromycin (ZITHROMAX) 250 MG tablet Two tablets daily x 1 day then one tablet daily x 4 days 6 tablet 0  . fluconazole (DIFLUCAN) 100 MG tablet Two tablets daily x 1 day then one tablet daily x 6 days 8 tablet 0  . metroNIDAZOLE (METROGEL) 0.75 % vaginal gel Place 1 Applicatorful vaginally 2 (two) times daily. 70 g 0   Current Facility-Administered Medications  Medication Dose Route Frequency Provider Last Rate Last Dose  . hepatitis B vac  recombinant (RECOMBIVAX) injection 5 mcg  0.5 mL Intramuscular Once Wardell Honour, MD           Objective:    BP 156/85 mmHg  Pulse 99  Temp(Src) 98.4 F (36.9 C)  Resp 16  Ht 5\' 6"  (1.676 m)  Wt 297 lb (134.718 kg)  BMI 47.96 kg/m2  SpO2 97% Physical Exam  Constitutional: She is oriented to person, place, and time. She appears well-developed and well-nourished. No distress.  HENT:  Head: Normocephalic and atraumatic.  Right Ear: External ear normal.  Left Ear: External ear normal.  Nose: Nose normal.  Mouth/Throat: Oropharynx is clear and moist.  Eyes: Conjunctivae and EOM are normal. Pupils are equal, round, and reactive to light.  Neck: Normal range of motion. Neck supple. Carotid bruit is not present. No thyromegaly present.  Cardiovascular: Normal rate, regular rhythm, normal heart sounds and intact distal pulses.  Exam reveals no gallop and no friction rub.   No murmur heard. Pulmonary/Chest: Effort normal and breath sounds normal. She has no wheezes. She has no rales.  Abdominal: Soft. Bowel sounds are normal. She exhibits no distension and no mass. There is no tenderness. There is no rebound and no guarding.  Genitourinary: There is no rash, tenderness or lesion on the right labia. There is no rash, tenderness or lesion on the left labia. Right adnexum displays no mass, no tenderness and no fullness. Left adnexum displays no mass, no tenderness and no fullness. There is erythema and tenderness in the vagina. No bleeding in the vagina. No foreign body around the vagina. Vaginal discharge found.  Lymphadenopathy:    She has no cervical adenopathy.  Neurological: She is alert and oriented to person, place, and time. No cranial nerve deficit.  Skin: Skin is warm and dry. No rash noted. She is not diaphoretic. No erythema. No pallor.  Psychiatric: She has a normal mood and affect. Her behavior is normal.        Assessment & Plan:   1. Type 2 diabetes mellitus without  complication   2. Essential hypertension, benign   3. Hyperlipidemia   4. Generalized anxiety disorder   5. Morbid obesity   6. Anxiety   7. Lapband APL with HH repair   8. Vaginal itching   9. Diabetes mellitus without complication   10. Elevated LFTs   11. Lower respiratory  infection   12. Asthma with acute exacerbation, mild persistent   13. Candidiasis of genitalia in female   78. BV (bacterial vaginosis)      1.  DMII: uncontrolled; increase Lantus to 20 units daily; obtain labs; continue other medications.  RTC six weeks for recheck of sugars. 2.  HTN: elevated today; monitor at home daily; obtain labs; call if BP remains elevated. 3.  Hyperlipidemia: uncontrolled; obtain labs; encourage improved compliance with Lipitor. 4.  Anxiety disorder: worsening; increase Prozac to 40mg  daily; continue Xanax qhs.  5.  Morbid obesity: worsening; encourage weight loss, exercise, dietary modification.  RTC 3 months for weight check. 6.  Lower respiratory infection: New.  Rx for Zpack provided; recommend Mucinex DM bid. 7.  Asthma with mild exacerbation: New.  Continue QVAR bid; continue Albuterol but increase to qid scheduled for next week and then PRN. 8.  Candidiasis vulvovaginal: New.  Rx for Diflucan provided; continue Nystatin powder. 9. BV:  New. Rx for Metrogel provided.   Meds ordered this encounter  Medications  . FLUoxetine (PROZAC) 20 MG tablet    Sig: Take 2 tablets (40 mg total) by mouth daily.    Dispense:  180 tablet    Refill:  3  . azithromycin (ZITHROMAX) 250 MG tablet    Sig: Two tablets daily x 1 day then one tablet daily x 4 days    Dispense:  6 tablet    Refill:  0  . ALPRAZolam (XANAX) 0.5 MG tablet    Sig: Take 1 tablet (0.5 mg total) by mouth at bedtime as needed for anxiety.    Dispense:  30 tablet    Refill:  5  . Insulin Glargine (LANTUS SOLOSTAR) 100 UNIT/ML Solostar Pen    Sig: Inject 20 Units into the skin daily at 10 pm.    Dispense:  15 mL     Refill:  11  . fluconazole (DIFLUCAN) 100 MG tablet    Sig: Two tablets daily x 1 day then one tablet daily x 6 days    Dispense:  8 tablet    Refill:  0  . metroNIDAZOLE (METROGEL) 0.75 % vaginal gel    Sig: Place 1 Applicatorful vaginally 2 (two) times daily.    Dispense:  70 g    Refill:  0    Return in about 6 weeks (around 07/08/2014) for recheck.     [name], M.D. [facility] [address] [place], [place]  [address] [phone] phone [phone] fax

## 2014-05-27 NOTE — Patient Instructions (Signed)
1.   Check sugars once daily. 2.  Increase Lantus to 20 units daily. 3.  Increase Fluoxetine to 40mg  daily. 4. Take Metrogel for vaginal bacterial infection. 5.  Take Diflucan for yeast infection. 6. Continue Nystatin powder. 7. Continue Mucinex DM for cough.

## 2014-06-03 LAB — HEPATITIS PANEL, ACUTE
HCV Ab: NEGATIVE
Hep A IgM: NONREACTIVE
Hep B C IgM: NONREACTIVE
Hepatitis B Surface Ag: NEGATIVE

## 2014-06-04 ENCOUNTER — Encounter: Payer: Self-pay | Admitting: Family Medicine

## 2014-06-09 ENCOUNTER — Other Ambulatory Visit: Payer: Self-pay | Admitting: Family Medicine

## 2014-07-06 ENCOUNTER — Ambulatory Visit (INDEPENDENT_AMBULATORY_CARE_PROVIDER_SITE_OTHER): Payer: BC Managed Care – PPO | Admitting: Internal Medicine

## 2014-07-06 ENCOUNTER — Ambulatory Visit (INDEPENDENT_AMBULATORY_CARE_PROVIDER_SITE_OTHER): Payer: BC Managed Care – PPO

## 2014-07-06 VITALS — BP 132/82 | HR 121 | Temp 98.7°F | Resp 17 | Ht 66.0 in | Wt 291.0 lb

## 2014-07-06 DIAGNOSIS — R05 Cough: Secondary | ICD-10-CM

## 2014-07-06 DIAGNOSIS — J0141 Acute recurrent pansinusitis: Secondary | ICD-10-CM

## 2014-07-06 DIAGNOSIS — E119 Type 2 diabetes mellitus without complications: Secondary | ICD-10-CM

## 2014-07-06 DIAGNOSIS — R7989 Other specified abnormal findings of blood chemistry: Secondary | ICD-10-CM | POA: Diagnosis not present

## 2014-07-06 DIAGNOSIS — S20212A Contusion of left front wall of thorax, initial encounter: Secondary | ICD-10-CM | POA: Diagnosis not present

## 2014-07-06 DIAGNOSIS — R079 Chest pain, unspecified: Secondary | ICD-10-CM

## 2014-07-06 DIAGNOSIS — R059 Cough, unspecified: Secondary | ICD-10-CM

## 2014-07-06 DIAGNOSIS — R945 Abnormal results of liver function studies: Secondary | ICD-10-CM

## 2014-07-06 LAB — COMPREHENSIVE METABOLIC PANEL
ALK PHOS: 60 U/L (ref 39–117)
ALT: 38 U/L — ABNORMAL HIGH (ref 0–35)
AST: 21 U/L (ref 0–37)
Albumin: 4 g/dL (ref 3.5–5.2)
BUN: 11 mg/dL (ref 6–23)
CO2: 27 mEq/L (ref 19–32)
CREATININE: 0.84 mg/dL (ref 0.50–1.10)
Calcium: 9.3 mg/dL (ref 8.4–10.5)
Chloride: 100 mEq/L (ref 96–112)
GLUCOSE: 299 mg/dL — AB (ref 70–99)
Potassium: 4.4 mEq/L (ref 3.5–5.3)
SODIUM: 140 meq/L (ref 135–145)
TOTAL PROTEIN: 6.9 g/dL (ref 6.0–8.3)
Total Bilirubin: 0.5 mg/dL (ref 0.2–1.2)

## 2014-07-06 LAB — POCT CBC
Granulocyte percent: 73.2 %G (ref 37–80)
HEMATOCRIT: 43.2 % (ref 37.7–47.9)
Hemoglobin: 14 g/dL (ref 12.2–16.2)
LYMPH, POC: 2 (ref 0.6–3.4)
MCH, POC: 27.7 pg (ref 27–31.2)
MCHC: 32.5 g/dL (ref 31.8–35.4)
MCV: 85.3 fL (ref 80–97)
MID (cbc): 0.5 (ref 0–0.9)
MPV: 7.9 fL (ref 0–99.8)
POC GRANULOCYTE: 6.7 (ref 2–6.9)
POC LYMPH PERCENT: 21.2 %L (ref 10–50)
POC MID %: 5.6 %M (ref 0–12)
Platelet Count, POC: 305 10*3/uL (ref 142–424)
RBC: 5.06 M/uL (ref 4.04–5.48)
RDW, POC: 14.8 %
WBC: 9.2 10*3/uL (ref 4.6–10.2)

## 2014-07-06 LAB — GLUCOSE, POCT (MANUAL RESULT ENTRY): POC Glucose: 294 mg/dl — AB (ref 70–99)

## 2014-07-06 MED ORDER — TRAMADOL HCL 50 MG PO TABS: 50.0000 mg | ORAL_TABLET | Freq: Three times a day (TID) | ORAL | Status: DC | PRN

## 2014-07-06 MED ORDER — AZITHROMYCIN 500 MG PO TABS: 500.0000 mg | ORAL_TABLET | Freq: Every day | ORAL | Status: DC

## 2014-07-06 NOTE — Progress Notes (Signed)
   Subjective:    Patient ID: [name], female    DOB: April 20, 1965, 49 y.o.   MRN: [id number]  HPI  Scribed by Leroy Kennedy LPN  49 year old female complaining of sinus pressure, congestion, fever Has had sinus surgery in past, is taking zyrtec at night allegra in the am, flonase 2 sprays, asalin, and also uses saline Pt is diabetic, fasting today. Has a penicillin allergy( rash) Fell on June 20, 2014 hit left side right under breast, tender no pain when breathing.       Review of Systems     Objective:   Physical Exam        Assessment & Plan:

## 2014-07-06 NOTE — Progress Notes (Signed)
   Subjective:    Patient ID: [name], female    DOB: [birth year], 49 y.o.   MRN: [id number]  HPI  Scribed by Leroy Kennedy LPN  49 year old female complaining of sinus pressure, congestion, fever Has had sinus surgery in past, is taking zyrtec at night allegra in the am, flonase 2 sprays, asalin, and also uses saline Pt is diabetic, fasting today. Has a penicillin allergy( rash) Fell on June 20, 2014 hit left side right under breast, tender no pain when breathing. Also has LFTs creeping up followed closely by Dr. Tamala Julian. No hepatitis sxs.     Review of Systems     Objective:   Physical Exam  Constitutional: She is oriented to person, place, and time. She appears well-nourished. No distress.  HENT:  Head: Normocephalic.  Right Ear: External ear normal.  Left Ear: External ear normal.  Nose: Mucosal edema, rhinorrhea and sinus tenderness present. No epistaxis. Right sinus exhibits maxillary sinus tenderness and frontal sinus tenderness. Left sinus exhibits maxillary sinus tenderness and frontal sinus tenderness.  Eyes: EOM are normal. No scleral icterus.  Neck: Normal range of motion. Neck supple.  Cardiovascular: Regular rhythm, S1 normal and S2 normal.  Tachycardia present.   Pulmonary/Chest: Effort normal and breath sounds normal. She exhibits tenderness and bony tenderness. She exhibits no laceration and no deformity.    Resolving hematoma Ribs mildly tender.  Abdominal: Soft.  Musculoskeletal: She exhibits tenderness.  Neurological: She is alert and oriented to person, place, and time. She exhibits normal muscle tone. Coordination normal.  Psychiatric: She has a normal mood and affect. Her behavior is normal. Judgment and thought content normal.   Oximetry 98%/pulse 107 [facility] reading (PRIMARY) by  Dr.Marvel Mcphillips no pneumothorax, no effusion, no fx likely.  Results for orders placed or performed in visit on 07/06/14  POCT CBC  Result Value Ref Range   WBC 9.2 4.6 - 10.2  K/uL   Lymph, poc 2.0 0.6 - 3.4   POC LYMPH PERCENT 21.2 10 - 50 %L   MID (cbc) 0.5 0 - 0.9   POC MID % 5.6 0 - 12 %M   POC Granulocyte 6.7 2 - 6.9   Granulocyte percent 73.2 37 - 80 %G   RBC 5.06 4.04 - 5.48 M/uL   Hemoglobin 14.0 12.2 - 16.2 g/dL   HCT, POC 43.2 37.7 - 47.9 %   MCV 85.3 80 - 97 fL   MCH, POC 27.7 27 - 31.2 pg   MCHC 32.5 31.8 - 35.4 g/dL   RDW, POC 14.8 %   Platelet Count, POC 305 142 - 424 K/uL   MPV 7.9 0 - 99.8 fL  POCT glucose (manual entry)  Result Value Ref Range   POC Glucose 294 (A) 70 - 99 mg/dl   She is able to take tramadol without side affects.      Assessment & Plan:  Sinusitis/Allergic rhinitis/Facial pain Fever T2DM Elevated LFTs Contusion Chest/Hematoma chest/Pain chest  Zithromax 500mg  5d/Consider ENT visit/No steroids

## 2014-07-06 NOTE — Patient Instructions (Addendum)
Liver Profile A liver profile is a battery of tests which helps your caregiver evaluate your liver function. The following tests are often included in the liver profile: Alanine aminotransferase (ALT or SGPT) This is an enzyme found primarily in the liver. Abnormalities may represent liver disease. This is found in cells of the liver so when it is elevated, it has been released by damaged cells. Albumin - The serum albumin is one of the major proteins in the blood and a reflection of the general state of nutrition. This is low when the liver is unable to do its job. It is also low when protein is lost in the urine. NORMAL FINDINGS Adult/Elderly  Total protein: 6.4-8.3 g/dL or 64-83 g/L (SI units)  Albumin: 3.5-5 g/dL or 35-50 g/L (SI units)  Globulin: 2.3-3.4 g/dL  Alpha1 globulin: 0.1-0.3 g/dL or 1-3 g/L (SI units)  Alpha2 globulin: 0.6-1 g/dL or 6-10 g/L (SI units)  Beta globulin: 0.7-1.1 g/dL or 7-11 g/L (SI units) Children  Total protein  Premature infant: 4.2-7.6 g/dL  Newborn: 4.6-7.4 g/dL  Infant: 6-6.7 g/dL  Child: 6.2-8 g/dL  Albumin  Premature infant: 3-4.2 g/dL  Newborn: 3.5-5.4 g/dL  Infant: 4.4-5.4 g/dL  Child: 4-5.9 g/dL Albumin/Globulin ratio - Calculated by dividing the albumin by the globulin. It is a measure of well being.  Alkaline phosphatase - This is an enzyme which is important in diagnosing proper bone and liver functions. NORMAL FINDINGS Age / Normal Value (units/L)  0-5 days / 35-140  Less than 3 yr / 15-60  3-6 yr / 15-50  6-12 yr / 10-50  12-18 yr / 10-40  Adult / 0-35 units/L or 0-0.58 microKat/L (SI Units) (Females tend to have slightly lower levels than males)  Elderly / Slightly higher than adults Aspartate aminotransferase (AST or SGOT) - an enzyme found in skeletal and heart muscle, liver and other organs. Abnormalities may represent liver disease. This is found in cells of the liver so when it is elevated, it has been  released by damaged cells. Bilirubin, Total: A chemical involved with liver functions. High concentrations may result in jaundice. Jaundice is a yellowing of the skin and the whites of the eyes. NORMAL FINDINGS Blood  Adult/elderly/child  Total bilirubin: 0.3-1.0 mg/dL or 5.1-17 micromole/L (SI units)  Indirect bilirubin: 0.2-0.8 mg/dL or 3.4-12.0 micromole/L (SI units)  Direct bilirubin: 0.1-0.3 mg/dL or 1.7-5.1 micromole/L (SI units)  Newborn total bilirubin: 1.0-12.0 mg/dL or 17.1-205 micromole/L (SI units)  Urine0-0.02 mg/dL Ranges for normal findings may vary among different laboratories and hospitals. You should always check with your doctor after having lab work or other tests done to discuss the meaning of your test results and whether your values are considered within normal limits PREPARATION FOR TEST No preparation or fasting is necessary unless you have been informed otherwise. A blood sample is obtained by inserting a needle into a vein in the arm. MEANING OF TEST  Your caregiver will go over the test results with you and discuss the importance and meaning of your results, as well as treatment options and the need for additional tests if necessary. OBTAINING THE TEST RESULTS It is your responsibility to obtain your test results. Ask the lab or department performing the test when and how you will get your results. Document Released: 04/08/2004 Document Revised: 05/29/2011 Document Reviewed: 07/08/2013 [facility] Patient Information 2015 [place], [place]. This information is not intended to replace advice given to you by your health care provider. Make sure you discuss any questions  you have with your health care provider. Sinusitis Sinusitis is redness, soreness, and inflammation of the paranasal sinuses. Paranasal sinuses are air pockets within the bones of your face (beneath the eyes, the middle of the forehead, or above the eyes). In healthy paranasal sinuses, mucus is able to  drain out, and air is able to circulate through them by way of your nose. However, when your paranasal sinuses are inflamed, mucus and air can become trapped. This can allow bacteria and other germs to grow and cause infection. Sinusitis can develop quickly and last only a short time (acute) or continue over a long period (chronic). Sinusitis that lasts for more than 12 weeks is considered chronic.  CAUSES  Causes of sinusitis include:  Allergies.  Structural abnormalities, such as displacement of the cartilage that separates your nostrils (deviated septum), which can decrease the air flow through your nose and sinuses and affect sinus drainage.  Functional abnormalities, such as when the small hairs (cilia) that line your sinuses and help remove mucus do not work properly or are not present. SIGNS AND SYMPTOMS  Symptoms of acute and chronic sinusitis are the same. The primary symptoms are pain and pressure around the affected sinuses. Other symptoms include:  Upper toothache.  Earache.  Headache.  Bad breath.  Decreased sense of smell and taste.  A cough, which worsens when you are lying flat.  Fatigue.  Fever.  Thick drainage from your nose, which often is green and may contain pus (purulent).  Swelling and warmth over the affected sinuses. DIAGNOSIS  Your health care provider will perform a physical exam. During the exam, your health care provider may:  Look in your nose for signs of abnormal growths in your nostrils (nasal polyps).  Tap over the affected sinus to check for signs of infection.  View the inside of your sinuses (endoscopy) using an imaging device that has a light attached (endoscope). If your health care provider suspects that you have chronic sinusitis, one or more of the following tests may be recommended:  Allergy tests.  Nasal culture. A sample of mucus is taken from your nose, sent to a lab, and screened for bacteria.  Nasal cytology. A sample of  mucus is taken from your nose and examined by your health care provider to determine if your sinusitis is related to an allergy. TREATMENT  Most cases of acute sinusitis are related to a viral infection and will resolve on their own within 10 days. Sometimes medicines are prescribed to help relieve symptoms (pain medicine, decongestants, nasal steroid sprays, or saline sprays).  However, for sinusitis related to a bacterial infection, your health care provider will prescribe antibiotic medicines. These are medicines that will help kill the bacteria causing the infection.  Rarely, sinusitis is caused by a fungal infection. In theses cases, your health care provider will prescribe antifungal medicine. For some cases of chronic sinusitis, surgery is needed. Generally, these are cases in which sinusitis recurs more than 3 times per year, despite other treatments. HOME CARE INSTRUCTIONS   Drink plenty of water. Water helps thin the mucus so your sinuses can drain more easily.  Use a humidifier.  Inhale steam 3 to 4 times a day (for example, sit in the bathroom with the shower running).  Apply a warm, moist washcloth to your face 3 to 4 times a day, or as directed by your health care provider.  Use saline nasal sprays to help moisten and clean your sinuses.  Take medicines only  as directed by your health care provider.  If you were prescribed either an antibiotic or antifungal medicine, finish it all even if you start to feel better. SEEK IMMEDIATE MEDICAL CARE IF:  You have increasing pain or severe headaches.  You have nausea, vomiting, or drowsiness.  You have swelling around your face.  You have vision problems.  You have a stiff neck.  You have difficulty breathing. MAKE SURE YOU:   Understand these instructions.  Will watch your condition.  Will get help right away if you are not doing well or get worse. Document Released: 03/06/2005 Document Revised: 07/21/2013 Document  Reviewed: 03/21/2011 [facility] Patient Information 2015 [place], [place]. This information is not intended to replace advice given to you by your health care provider. Make sure you discuss any questions you have with your health care provider.

## 2014-07-08 ENCOUNTER — Ambulatory Visit (INDEPENDENT_AMBULATORY_CARE_PROVIDER_SITE_OTHER): Payer: BC Managed Care – PPO | Admitting: Family Medicine

## 2014-07-08 ENCOUNTER — Encounter: Payer: Self-pay | Admitting: Family Medicine

## 2014-07-08 VITALS — BP 122/77 | HR 91 | Temp 98.3°F | Resp 18 | Ht 66.5 in | Wt 291.6 lb

## 2014-07-08 DIAGNOSIS — R6889 Other general symptoms and signs: Secondary | ICD-10-CM

## 2014-07-08 DIAGNOSIS — R945 Abnormal results of liver function studies: Secondary | ICD-10-CM

## 2014-07-08 DIAGNOSIS — J019 Acute sinusitis, unspecified: Secondary | ICD-10-CM

## 2014-07-08 DIAGNOSIS — R7989 Other specified abnormal findings of blood chemistry: Secondary | ICD-10-CM | POA: Diagnosis not present

## 2014-07-08 DIAGNOSIS — E119 Type 2 diabetes mellitus without complications: Secondary | ICD-10-CM

## 2014-07-08 DIAGNOSIS — E785 Hyperlipidemia, unspecified: Secondary | ICD-10-CM

## 2014-07-08 LAB — POCT URINALYSIS DIPSTICK
Bilirubin, UA: NEGATIVE
Ketones, UA: NEGATIVE
LEUKOCYTES UA: NEGATIVE
NITRITE UA: NEGATIVE
PH UA: 5.5
SPEC GRAV UA: 1.02
Urobilinogen, UA: 0.2

## 2014-07-08 LAB — POCT INFLUENZA A/B
INFLUENZA A, POC: NEGATIVE
INFLUENZA B, POC: NEGATIVE

## 2014-07-08 LAB — GLUCOSE, POCT (MANUAL RESULT ENTRY): POC Glucose: 262 mg/dl — AB (ref 70–99)

## 2014-07-08 MED ORDER — INSULIN GLARGINE 100 UNIT/ML SOLOSTAR PEN: 50.0000 [IU] | PEN_INJECTOR | Freq: Every day | SUBCUTANEOUS | Status: DC

## 2014-07-08 NOTE — Patient Instructions (Signed)
1. Increase Lantus to 26 units daily.

## 2014-07-08 NOTE — Progress Notes (Signed)
Subjective:    Patient ID: [name], female    DOB: 1966-01-22, 49 y.o.   MRN: [id number]  07/08/2014  Follow-up; Diabetes; Hyperlipidemia; and Sinusitis   HPI This 49 y.o. female presents for six week follow-up of the following:   1. DMII: taking Lantus 20 units daily; Metaglip four daily; Januvia one daily.  Not checking sugars.  Being very careful about foods.    2. Elevated LFTs:  Underwent repeat CMET two days ago with Dr. Elder Cyphers.  3.  Hypercholesterolemia: fasting at visit 48 hours ago.  Has improved compliance with statin therapy since last visit.  4. Acute sinusitis:  Taking Zithromax 500mg  daily.  Fever onset three days ago.  Tmax 102.  +headache.  +L ear pain.  +rhinorrhea; +nasal congestion.  No sore throat.  +coughing a lot.  Mild wheezing.  +nausea after Tramadol.  No diarrhea; no vomiting.  S/p CBC, CXR at visit two days ago. Head congestion from allergies has been ongoing for several weeks.  5. L breast contusion: tripped on diaper bag at home; landed directly on L breast; s/p evaluation by Dr. Elder Cyphers two days ago; prescribed Tramadol.    Review of Systems  Constitutional: Negative for fever, chills, diaphoresis and fatigue.  HENT: Positive for congestion, ear pain, postnasal drip, rhinorrhea, sinus pressure and sore throat.   Eyes: Negative for visual disturbance.  Respiratory: Positive for cough, shortness of breath and wheezing.   Cardiovascular: Negative for chest pain, palpitations and leg swelling.  Gastrointestinal: Negative for nausea, vomiting, abdominal pain, diarrhea and constipation.  Endocrine: Negative for cold intolerance, heat intolerance, polydipsia, polyphagia and polyuria.  Skin: Negative for rash and wound.  Neurological: Negative for dizziness, tremors, seizures, syncope, facial asymmetry, speech difficulty, weakness, light-headedness, numbness and headaches.    Past Medical History  Diagnosis Date  . Diabetes mellitus   . Hypertension     . Anemia   . Migraines   . Thyroid disease   . Colon polyp   . IBS (irritable bowel syndrome)   . Fatigue   . Night sweats   . Lump in female breast   . Asthma   . Sinus problem   . Reflux   . Methicillin resistant Staphylococcus aureus in conditions classified elsewhere and of unspecified site   . Paresthesias   . Migraine, unspecified, without mention of intractable migraine without mention of status migrainosus   . Unspecified acute reaction to stress   . Nonspecific abnormal results of thyroid function study   . Essential hypertension, benign   . Personal history of colonic polyps   . Pure hypercholesterolemia   . Type II or unspecified type diabetes mellitus without mention of complication, not stated as uncontrolled   . Morbid obesity   . Other B-complex deficiencies   . Edema   . Unspecified sleep apnea   . Symptomatic states associated with artificial menopause   . Allergy   . Arthritis   . Anxiety    Past Surgical History  Procedure Laterality Date  . Cholecystectomy  03/1989  . Abdominal hysterectomy  1997    complete in 1997, partial was in 1995  . Knee surgery  2006  . Laparoscopic gastric banding  05/2009  . Cesarean section  1987  and 1989  . Abcess removal  1997    MRSA  . Tonsillectomy and adenoidectomy  1974  . Tubal ligation    . Colonoscopy  03/21/2011    Normal.  [facility].  . Carpal tunnel release  r  07/03/13    [facility]   Allergies  Allergen Reactions  . Amoxicillin Shortness Of Breath and Rash  . Levofloxacin Shortness Of Breath and Rash  . Meloxicam Hives and Rash  . Vancomycin Anaphylaxis  . Ace Inhibitors Cough  . Codeine   . Meperidine Hcl   . Oxycodone-Acetaminophen   . Oxycodone-Acetaminophen   . Penicillins Swelling and Rash  . Tequin [Gatifloxacin] Rash        Objective:    BP 122/77 mmHg  Pulse 91  Temp(Src) 98.3 F (36.8 C) (Oral)  Resp 18  Ht 5' 6.5" (1.689 m)  Wt 291 lb 9.6 oz (132.269 kg)  BMI 46.37 kg/m2   SpO2 96% Physical Exam  Constitutional: She is oriented to person, place, and time. She appears well-developed and well-nourished. No distress.  HENT:  Head: Normocephalic and atraumatic.  Right Ear: Tympanic membrane, external ear and ear canal normal.  Left Ear: Tympanic membrane, external ear and ear canal normal.  Nose: Mucosal edema and rhinorrhea present. Right sinus exhibits maxillary sinus tenderness. Right sinus exhibits no frontal sinus tenderness. Left sinus exhibits maxillary sinus tenderness. Left sinus exhibits no frontal sinus tenderness.  Mouth/Throat: Oropharynx is clear and moist.  Eyes: Conjunctivae and EOM are normal. Pupils are equal, round, and reactive to light.  Neck: Normal range of motion. Neck supple. Carotid bruit is not present. No thyromegaly present.  Cardiovascular: Normal rate, regular rhythm, normal heart sounds and intact distal pulses.  Exam reveals no gallop and no friction rub.   No murmur heard. Pulmonary/Chest: Effort normal and breath sounds normal. She has no wheezes. She has no rales.  Abdominal: Soft. Bowel sounds are normal. She exhibits no distension and no mass. There is no tenderness. There is no rebound and no guarding.  Lymphadenopathy:    She has no cervical adenopathy.  Neurological: She is alert and oriented to person, place, and time. No cranial nerve deficit.  Skin: Skin is warm and dry. No rash noted. She is not diaphoretic. No erythema. No pallor.  Psychiatric: She has a normal mood and affect. Her behavior is normal.   Results for orders placed or performed in visit on 07/08/14  POCT glucose (manual entry)  Result Value Ref Range   POC Glucose 262 (A) 70 - 99 mg/dl  POCT urinalysis dipstick  Result Value Ref Range   Color, UA Yellow    Clarity, UA Slightly Cloudy    Glucose, UA >=1000    Bilirubin, UA Negative    Ketones, UA Negative    Spec Grav, UA 1.020    Blood, UA Trace-Intact    pH, UA 5.5    Protein, UA Trace     Urobilinogen, UA 0.2    Nitrite, UA Negative    Leukocytes, UA Negative   POCT Influenza A/B  Result Value Ref Range   Influenza A, POC Negative    Influenza B, POC Negative        Assessment & Plan:   1. Type 2 diabetes mellitus without complication   2. Diabetes mellitus without complication   3. Flu-like symptoms   4. Hyperlipidemia   5. Abnormal liver function tests    1. DMII: uncontrolled despite increase in Lantus dose; increase Lantus to 26 units daily; continue all other diabetic medications at current doses. 2.  Flu like symptoms: New. Onset three days ago; continue Zithromax for acute sinusitis.  Outside of 48-72 hour window to start Tamiflu and starting to feel better.  Continue  with Allegra, Flonase, Astelin, Tramadol for supportive care. 3.  Hyperlipidemia: uncontrolled; add FLP to recent labs.  Improved compliance with medication. 4. Elevated LFTs; improved.    Meds ordered this encounter  Medications  . Insulin Glargine (LANTUS SOLOSTAR) 100 UNIT/ML Solostar Pen    Sig: Inject 50 Units into the skin daily at 10 pm.    Dispense:  15 mL    Refill:  11    Return in about 6 weeks (around 08/19/2014).   Marrisa Kimber Elayne Guerin, M.D. [facility] [address] [place], [place]  [address] [phone] phone [phone] fax

## 2014-07-27 ENCOUNTER — Other Ambulatory Visit: Payer: Self-pay

## 2014-07-27 DIAGNOSIS — Z1231 Encounter for screening mammogram for malignant neoplasm of breast: Secondary | ICD-10-CM

## 2014-07-30 ENCOUNTER — Other Ambulatory Visit: Payer: Self-pay | Admitting: Family Medicine

## 2014-08-19 ENCOUNTER — Ambulatory Visit: Payer: BC Managed Care – PPO | Admitting: Family Medicine

## 2014-08-20 ENCOUNTER — Ambulatory Visit
Admission: RE | Admit: 2014-08-20 | Discharge: 2014-08-20 | Disposition: A | Discharge status: Home / Self Care | Payer: BC Managed Care – PPO | Source: Ambulatory Visit

## 2014-08-20 DIAGNOSIS — Z1231 Encounter for screening mammogram for malignant neoplasm of breast: Secondary | ICD-10-CM

## 2014-09-01 ENCOUNTER — Other Ambulatory Visit: Payer: Self-pay | Admitting: Family Medicine

## 2014-10-09 ENCOUNTER — Ambulatory Visit: Payer: BC Managed Care – PPO | Admitting: Family Medicine

## 2014-11-16 ENCOUNTER — Other Ambulatory Visit: Payer: Self-pay | Admitting: Family Medicine

## 2014-11-16 ENCOUNTER — Encounter: Payer: Self-pay | Admitting: Family Medicine

## 2014-11-16 MED ORDER — GABAPENTIN 300 MG PO CAPS: ORAL_CAPSULE | ORAL | Status: DC

## 2014-11-18 ENCOUNTER — Ambulatory Visit: Payer: Self-pay | Admitting: Family Medicine

## 2014-11-19 ENCOUNTER — Other Ambulatory Visit: Payer: Self-pay | Admitting: Family Medicine

## 2014-12-06 ENCOUNTER — Other Ambulatory Visit: Payer: Self-pay | Admitting: Family Medicine

## 2014-12-07 NOTE — Telephone Encounter (Signed)
Please call in refill of Xanax as approved. 

## 2014-12-08 NOTE — Telephone Encounter (Signed)
Called in.

## 2014-12-28 ENCOUNTER — Other Ambulatory Visit: Payer: Self-pay | Admitting: Physician Assistant

## 2014-12-28 ENCOUNTER — Encounter: Payer: Self-pay | Admitting: Family Medicine

## 2014-12-28 ENCOUNTER — Ambulatory Visit (INDEPENDENT_AMBULATORY_CARE_PROVIDER_SITE_OTHER): Payer: BC Managed Care – PPO | Admitting: Family Medicine

## 2014-12-28 VITALS — BP 131/81 | HR 99 | Temp 98.6°F | Resp 16 | Ht 66.75 in | Wt 301.0 lb

## 2014-12-28 DIAGNOSIS — R7989 Other specified abnormal findings of blood chemistry: Secondary | ICD-10-CM | POA: Diagnosis not present

## 2014-12-28 DIAGNOSIS — I10 Essential (primary) hypertension: Secondary | ICD-10-CM | POA: Diagnosis not present

## 2014-12-28 DIAGNOSIS — Z114 Encounter for screening for human immunodeficiency virus [HIV]: Secondary | ICD-10-CM

## 2014-12-28 DIAGNOSIS — F419 Anxiety disorder, unspecified: Secondary | ICD-10-CM

## 2014-12-28 DIAGNOSIS — Z794 Long term (current) use of insulin: Secondary | ICD-10-CM

## 2014-12-28 DIAGNOSIS — E7800 Pure hypercholesterolemia, unspecified: Secondary | ICD-10-CM | POA: Diagnosis not present

## 2014-12-28 DIAGNOSIS — E119 Type 2 diabetes mellitus without complications: Secondary | ICD-10-CM

## 2014-12-28 DIAGNOSIS — F329 Major depressive disorder, single episode, unspecified: Secondary | ICD-10-CM

## 2014-12-28 DIAGNOSIS — Z23 Encounter for immunization: Secondary | ICD-10-CM

## 2014-12-28 DIAGNOSIS — J301 Allergic rhinitis due to pollen: Secondary | ICD-10-CM

## 2014-12-28 DIAGNOSIS — F418 Other specified anxiety disorders: Secondary | ICD-10-CM

## 2014-12-28 DIAGNOSIS — R945 Abnormal results of liver function studies: Secondary | ICD-10-CM

## 2014-12-28 LAB — POCT URINALYSIS DIP (MANUAL ENTRY)
BILIRUBIN UA: NEGATIVE
Glucose, UA: NEGATIVE
NITRITE UA: NEGATIVE
PH UA: 5
Protein Ur, POC: NEGATIVE
RBC UA: NEGATIVE
Spec Grav, UA: 1.025
UROBILINOGEN UA: 0.2

## 2014-12-28 LAB — POCT GLYCOSYLATED HEMOGLOBIN (HGB A1C): Hemoglobin A1C: 10

## 2014-12-28 LAB — GLUCOSE, POCT (MANUAL RESULT ENTRY): POC GLUCOSE: 133 mg/dL — AB (ref 70–99)

## 2014-12-28 MED ORDER — SITAGLIPTIN PHOSPHATE 100 MG PO TABS: ORAL_TABLET | ORAL | Status: DC

## 2014-12-28 NOTE — Progress Notes (Signed)
Subjective:    Patient ID: [name], female    DOB: 02-Nov-1965, 49 y.o.   MRN: [id number]  12/28/2014  Follow-up and Flu Vaccine   HPI This 49 y.o. female presents for six month follow-up:  1.  DMII: Patient reports good compliance with medication, good tolerance to medication, and good symptom control.  Not checking.  Yeast is decreased.  Lantus 30 units at bedtime.  Metaglip four daily.  Januvia one daily.  S/p flu vaccine.  Last eye exam fall 2015; [facility].  2.  HTN: Patient reports good compliance with medication, good tolerance to medication, and good symptom control.  Losartan 1/2 daily; HCTZ 1/2 daily.  3. Hyperlipidemia: Patient reports good compliance with medication, good tolerance to medication, and good symptom control.  Lipitor 20mg  daily. Forgets a lot.  4.  Anxiety and depression: Patient reports good compliance with medication, good tolerance to medication, and good symptom control.  Xanax qhs with Neurontin; sleeping well.  Prozac 40mg  daily.    5.  Asthma: Patient reports good compliance with medication, good tolerance to medication, and good symptom control.  No Abluterol usel  6.  Allergic Rhinitis: Patient reports good compliance with medication, good tolerance to medication, and good symptom control.  Allegra daily; Flonase daily; Astelin qhs.Zyrtec qhs.    Review of Systems  Constitutional: Negative for fever, chills, diaphoresis and fatigue.  Eyes: Negative for visual disturbance.  Respiratory: Negative for cough and shortness of breath.   Cardiovascular: Negative for chest pain, palpitations and leg swelling.  Gastrointestinal: Negative for nausea, vomiting, abdominal pain, diarrhea and constipation.  Endocrine: Negative for cold intolerance, heat intolerance, polydipsia, polyphagia and polyuria.  Neurological: Negative for dizziness, tremors, seizures, syncope, facial asymmetry, speech difficulty, weakness, light-headedness, numbness  and headaches.    Past Medical History  Diagnosis Date  . Diabetes mellitus   . Hypertension   . Anemia   . Migraines   . Thyroid disease   . Colon polyp   . IBS (irritable bowel syndrome)   . Fatigue   . Night sweats   . Lump in female breast   . Asthma   . Sinus problem   . Reflux   . Methicillin resistant Staphylococcus aureus in conditions classified elsewhere and of unspecified site   . Paresthesias   . Migraine, unspecified, without mention of intractable migraine without mention of status migrainosus   . Unspecified acute reaction to stress   . Nonspecific abnormal results of thyroid function study   . Essential hypertension, benign   . Personal history of colonic polyps   . Pure hypercholesterolemia   . Type II or unspecified type diabetes mellitus without mention of complication, not stated as uncontrolled   . Morbid obesity ([place])   . Other B-complex deficiencies   . Edema   . Unspecified sleep apnea   . Symptomatic states associated with artificial menopause   . Allergy   . Arthritis   . Anxiety    Past Surgical History  Procedure Laterality Date  . Cholecystectomy  03/1989  . Abdominal hysterectomy  1997    complete in 1997, partial was in 1995  . Knee surgery  2006  . Laparoscopic gastric banding  05/2009  . Cesarean section  1987  and 1989  . Abcess removal  1997    MRSA  . Tonsillectomy and adenoidectomy  1974  . Tubal ligation    . Colonoscopy  03/21/2011    Normal.  [facility].  . Carpal tunnel  release r  07/03/13    [facility]   Allergies  Allergen Reactions  . Amoxicillin Shortness Of Breath and Rash  . Levofloxacin Shortness Of Breath and Rash  . Meloxicam Hives and Rash  . Vancomycin Anaphylaxis  . Ace Inhibitors Cough  . Codeine   . Meperidine Hcl   . Oxycodone-Acetaminophen   . Oxycodone-Acetaminophen   . Penicillins Swelling and Rash  . Tequin [Gatifloxacin] Rash   Current Outpatient Prescriptions  Medication Sig Dispense Refill  .  albuterol (PROVENTIL HFA;VENTOLIN HFA) 108 (90 BASE) MCG/ACT inhaler Inhale 2 puffs into the lungs every 6 (six) hours as needed for wheezing. 1 Inhaler 1  . ALPRAZolam (XANAX) 0.5 MG tablet TAKE 1 TABLET BY MOUTH EVERY NIGHT AT BEDTIME AS NEEDED. 30 tablet 5  . atorvastatin (LIPITOR) 20 MG tablet Take 1 tablet (20 mg total) by mouth daily. 90 tablet 3  . azelastine (ASTELIN) 0.1 % nasal spray Place 2 sprays into both nostrils 2 (two) times daily. Use in each nostril as directed 30 mL 11  . beclomethasone (QVAR) 80 MCG/ACT inhaler Inhale 2 puffs into the lungs every 12 (twelve) hours as needed. 1 Inhaler 11  . CALCIUM PO Take by mouth 3 (three) times daily.      . Cetirizine HCl (ZYRTEC PO) Take by mouth daily.      . cyclobenzaprine (FLEXERIL) 5 MG tablet One at bedtime for shoulder pain 30 tablet 1  . Fexofenadine HCl (ALLEGRA PO) Take by mouth.      [name] FLUoxetine (PROZAC) 20 MG tablet Take 2 tablets (40 mg total) by mouth daily. 180 tablet 3  . fluticasone (FLONASE) 50 MCG/ACT nasal spray Place 2 sprays into both nostrils daily. 16 g 11  . gabapentin (NEURONTIN) 300 MG capsule One pill tid 90 capsule 5  . glipiZIDE-metformin (METAGLIP) 2.5-500 MG per tablet Take 2 tablets by mouth 2 (two) times daily before a meal. 360 tablet 3  . hydrochlorothiazide (HYDRODIURIL) 25 MG tablet Take 0.5 tablets (12.5 mg total) by mouth daily. 90 tablet 1  . Insulin Glargine (LANTUS SOLOSTAR) 100 UNIT/ML Solostar Pen Inject 50 Units into the skin daily at 10 pm. 15 mL 11  . ketoconazole (NIZORAL) 2 % cream Apply topically 2 (two) times daily. 60 g 0  . levalbuterol (XOPENEX) 1.25 MG/3ML nebulizer solution Take 1.25 mg by nebulization daily as needed.    [name] losartan (COZAAR) 100 MG tablet Take 0.5 tablets (50 mg total) by mouth daily. 90 tablet 1  . metroNIDAZOLE (METROGEL) 0.75 % vaginal gel PLACE 1 APPLICATORFUL VAGINALLY 2 (TWO) TIMES DAILY 70 g 0  . Multiple Vitamin (MULTIVITAMIN PO) Take by mouth daily.      [name]  Nystatin (NYAMYC) 100000 UNIT/GM POWD Apply to affected area twice daily. 60 g 5  . nystatin-triamcinolone (MYCOLOG II) cream Apply topically 2 (two) times daily. 60 g 3  . ondansetron (ZOFRAN ODT) 4 MG disintegrating tablet Take 1 tablet (4 mg total) by mouth every 8 (eight) hours as needed for nausea. 10 tablet 0  . sitaGLIPtin (JANUVIA) 100 MG tablet TAKE 1 TABLET BY MOUTH DAILY 30 tablet 0  . traMADol (ULTRAM) 50 MG tablet Take 1 tablet (50 mg total) by mouth every 8 (eight) hours as needed. (Patient not taking: Reported on 12/28/2014) 30 tablet 0   Current Facility-Administered Medications  Medication Dose Route Frequency Provider Last Rate Last Dose  . hepatitis B vac recombinant (RECOMBIVAX) injection 5 mcg  0.5 mL Intramuscular Once Wardell Honour, MD  Social History   Social History  . Marital Status: Married    Spouse Name: N/A  . Number of Children: 3  . Years of Education: college   Occupational History  . budget analyst [employer]   Social History Main Topics  . Smoking status: Never Smoker   . Smokeless tobacco: Not on file  . Alcohol Use: 0.0 oz/week    0 drink(s) per week     Comment: rarely - once per week at most  . Drug Use: No  . Sexual Activity: No   Other Topics Concern  . Not on file   Social History Narrative   Exercise: walking two days per week, 30 minutes.       Married x 29 years, happily married; no abuse.       Children: 3 children; 1 grandchild; two step grandchildren; 1 gg.      Lives: with husband, mother-in-law.      Employment:  [employer] x 1996; Product manager.  [place].      Tobacco: never      Alcohol: socially.  One glass of wine per month.      Exercise:  Walking around campus.        Seatbelt: 100%      Guns: gun safe in garage.   Family History  Problem Relation Age of Onset  . Diabetes Mother   . Heart disease Mother 35    CHF, CAD  . Other Mother     muscle disease  . Hypertension Mother   . Hyperlipidemia Mother   .  Arthritis Mother     OA hip L s/p THR  . Diabetes Father   . Heart disease Father     AMI/CABG/valve replacement age 74.  . Stroke Father   . Diabetes Brother   . Heart disease Brother     stent age 50.  . Breast cancer      2 Aunts  . Fibromyalgia Daughter   . Crohn's disease Daughter   . Hypertension Son   . Alzheimer's disease Maternal Grandmother   . Emphysema Maternal Grandfather   . Heart disease Paternal Grandmother        Objective:    BP 131/81 mmHg  Pulse 99  Temp(Src) 98.6 F (37 C) (Oral)  Resp 16  Ht 5' 6.75" (1.695 m)  Wt 301 lb (136.533 kg)  BMI 47.52 kg/m2  SpO2 97% Physical Exam  Constitutional: She is oriented to person, place, and time. She appears well-developed and well-nourished. No distress.  HENT:  Head: Normocephalic and atraumatic.  Right Ear: External ear normal.  Left Ear: External ear normal.  Nose: Nose normal.  Mouth/Throat: Oropharynx is clear and moist.  Eyes: Conjunctivae and EOM are normal. Pupils are equal, round, and reactive to light.  Neck: Normal range of motion. Neck supple. Carotid bruit is not present. No thyromegaly present.  Cardiovascular: Normal rate, regular rhythm, normal heart sounds and intact distal pulses.  Exam reveals no gallop and no friction rub.   No murmur heard. Pulmonary/Chest: Effort normal and breath sounds normal. She has no wheezes. She has no rales.  Abdominal: Soft. Bowel sounds are normal. She exhibits no distension and no mass. There is no tenderness. There is no rebound and no guarding.  Lymphadenopathy:    She has no cervical adenopathy.  Neurological: She is alert and oriented to person, place, and time. No cranial nerve deficit.  Skin: Skin is warm and dry. No rash noted. She is not  diaphoretic. No erythema. No pallor.  Psychiatric: She has a normal mood and affect. Her behavior is normal.   Results for orders placed or performed in visit on 07/08/14  POCT glucose (manual entry)  Result Value  Ref Range   POC Glucose 262 (A) 70 - 99 mg/dl  POCT urinalysis dipstick  Result Value Ref Range   Color, UA Yellow    Clarity, UA Slightly Cloudy    Glucose, UA >=1000    Bilirubin, UA Negative    Ketones, UA Negative    Spec Grav, UA 1.020    Blood, UA Trace-Intact    pH, UA 5.5    Protein, UA Trace    Urobilinogen, UA 0.2    Nitrite, UA Negative    Leukocytes, UA Negative   POCT Influenza A/B  Result Value Ref Range   Influenza A, POC Negative    Influenza B, POC Negative        Assessment & Plan:   1. Pure hypercholesterolemia   2. Essential hypertension, benign   3. Type 2 diabetes mellitus without complication, with long-term current use of insulin ([place])   4. Anxiety and depression   5. Seasonal allergic rhinitis due to pollen   6. Flu vaccine need     Orders Placed This Encounter  Procedures  . Flu Vaccine QUAD 36+ mos IM  . Microalbumin, urine  . CBC with Differential/Platelet  . Comprehensive metabolic panel    Order Specific Question:  Has the patient fasted?    Answer:  Yes  . Lipid panel    Order Specific Question:  Has the patient fasted?    Answer:  Yes  . TSH  . POCT glucose (manual entry)  . POCT glycosylated hemoglobin (Hb A1C)  . POCT urinalysis dipstick   No orders of the defined types were placed in this encounter.    No Follow-up on file.    Amandine Covino Elayne Guerin, M.D. [facility] [address] [place], [place]  [phone] phone [phone] fax

## 2014-12-29 LAB — TSH: TSH: 5.13 u[IU]/mL — AB (ref 0.350–4.500)

## 2014-12-29 LAB — COMPREHENSIVE METABOLIC PANEL
ALT: 49 U/L — ABNORMAL HIGH (ref 6–29)
AST: 35 U/L (ref 10–35)
Albumin: 4.1 g/dL (ref 3.6–5.1)
Alkaline Phosphatase: 52 U/L (ref 33–130)
BUN: 18 mg/dL (ref 7–25)
CALCIUM: 9.7 mg/dL (ref 8.6–10.4)
CHLORIDE: 95 mmol/L — AB (ref 98–110)
CO2: 26 mmol/L (ref 20–31)
Creat: 0.7 mg/dL (ref 0.50–1.05)
GLUCOSE: 146 mg/dL — AB (ref 65–99)
POTASSIUM: 4.1 mmol/L (ref 3.5–5.3)
Sodium: 136 mmol/L (ref 135–146)
Total Bilirubin: 0.4 mg/dL (ref 0.2–1.2)
Total Protein: 6.7 g/dL (ref 6.1–8.1)

## 2014-12-29 LAB — MICROALBUMIN, URINE: MICROALB UR: 0.9 mg/dL (ref <2.0)

## 2014-12-29 LAB — LIPID PANEL
CHOL/HDL RATIO: 5.2 ratio — AB (ref <=5.0)
Cholesterol: 270 mg/dL — ABNORMAL HIGH (ref 125–200)
HDL: 52 mg/dL (ref >=46)
Triglycerides: 481 mg/dL — ABNORMAL HIGH (ref <150)

## 2014-12-30 LAB — CBC WITH DIFFERENTIAL/PLATELET
BASOS PCT: 0 % (ref 0–1)
Basophils Absolute: 0 10*3/uL (ref 0.0–0.1)
Eosinophils Absolute: 0.2 10*3/uL (ref 0.0–0.7)
Eosinophils Relative: 2 % (ref 0–5)
HCT: 41.6 % (ref 36.0–46.0)
Hemoglobin: 13.9 g/dL (ref 12.0–15.0)
LYMPHS PCT: 36 % (ref 12–46)
Lymphs Abs: 3.2 10*3/uL (ref 0.7–4.0)
MCH: 28.1 pg (ref 26.0–34.0)
MCHC: 33.4 g/dL (ref 30.0–36.0)
MCV: 84.2 fL (ref 78.0–100.0)
MPV: 10.6 fL (ref 8.6–12.4)
Monocytes Absolute: 0.5 10*3/uL (ref 0.1–1.0)
Monocytes Relative: 6 % (ref 3–12)
NEUTROS ABS: 5 10*3/uL (ref 1.7–7.7)
Neutrophils Relative %: 56 % (ref 43–77)
PLATELETS: 349 10*3/uL (ref 150–400)
RBC: 4.94 MIL/uL (ref 3.87–5.11)
RDW: 14.1 % (ref 11.5–15.5)
WBC: 9 10*3/uL (ref 4.0–10.5)

## 2014-12-30 LAB — HIV ANTIBODY (ROUTINE TESTING W REFLEX): HIV: NONREACTIVE

## 2015-01-02 NOTE — Addendum Note (Signed)
Addended by: Fara Chute on: 01/02/2015 05:27 PM   Modules accepted: Orders

## 2015-01-26 ENCOUNTER — Other Ambulatory Visit: Payer: Self-pay | Admitting: Family Medicine

## 2015-02-19 ENCOUNTER — Ambulatory Visit: Payer: Self-pay | Admitting: Family Medicine

## 2015-03-08 ENCOUNTER — Other Ambulatory Visit: Payer: Self-pay | Admitting: Family Medicine

## 2015-03-25 ENCOUNTER — Ambulatory Visit
Admission: RE | Admit: 2015-03-25 | Discharge: 2015-03-25 | Disposition: A | Discharge status: Home / Self Care | Payer: BC Managed Care – PPO | Source: Ambulatory Visit | Attending: Family Medicine | Admitting: Family Medicine

## 2015-03-25 DIAGNOSIS — R945 Abnormal results of liver function studies: Principal | ICD-10-CM

## 2015-03-25 DIAGNOSIS — R7989 Other specified abnormal findings of blood chemistry: Secondary | ICD-10-CM

## 2015-03-29 ENCOUNTER — Encounter: Payer: Self-pay | Admitting: Family Medicine

## 2015-04-19 ENCOUNTER — Encounter: Payer: Self-pay | Admitting: Family Medicine

## 2015-04-26 ENCOUNTER — Encounter: Payer: Self-pay | Admitting: Family Medicine

## 2015-04-28 ENCOUNTER — Other Ambulatory Visit: Payer: Self-pay | Admitting: Family Medicine

## 2015-04-28 MED ORDER — INSULIN DETEMIR 100 UNIT/ML FLEXPEN: 50.0000 [IU] | PEN_INJECTOR | Freq: Every day | SUBCUTANEOUS | Status: DC

## 2015-05-18 ENCOUNTER — Encounter: Payer: Self-pay | Admitting: Family Medicine

## 2015-05-19 ENCOUNTER — Encounter: Payer: Self-pay | Admitting: Primary Care

## 2015-05-19 ENCOUNTER — Ambulatory Visit (INDEPENDENT_AMBULATORY_CARE_PROVIDER_SITE_OTHER): Payer: BC Managed Care – PPO | Admitting: Primary Care

## 2015-05-19 VITALS — BP 132/70 | HR 108 | Temp 98.6°F | Ht 66.75 in | Wt 308.4 lb

## 2015-05-19 DIAGNOSIS — J029 Acute pharyngitis, unspecified: Secondary | ICD-10-CM | POA: Diagnosis not present

## 2015-05-19 DIAGNOSIS — R300 Dysuria: Secondary | ICD-10-CM

## 2015-05-19 DIAGNOSIS — N898 Other specified noninflammatory disorders of vagina: Secondary | ICD-10-CM | POA: Diagnosis not present

## 2015-05-19 LAB — POC URINALSYSI DIPSTICK (AUTOMATED)
Bilirubin, UA: NEGATIVE
GLUCOSE UA: NEGATIVE
Leukocytes, UA: NEGATIVE
Nitrite, UA: NEGATIVE
PH UA: 6
RBC UA: NEGATIVE
UROBILINOGEN UA: NEGATIVE

## 2015-05-19 LAB — POCT RAPID STREP A (OFFICE): Rapid Strep A Screen: NEGATIVE

## 2015-05-19 MED ORDER — PHENAZOPYRIDINE HCL 200 MG PO TABS: 200.0000 mg | ORAL_TABLET | Freq: Three times a day (TID) | ORAL | Status: DC | PRN

## 2015-05-19 NOTE — Patient Instructions (Addendum)
Your urine is negative for infection. Your strep test is negative for infection.   We will send off your vaginal swab for testing of bacterial and yeast testing.  Continue to increase consumption of fluids.   You may take pyridium three times daily as needed for burning and pressure.  Your fever and sore throat are likely from a viral infection that will improve in the next 3-4 days. You may take tylenol, use chloraseptic spray, throat lozenges.   Please notify me if you develop persistent fevers of 101, start coughing up green mucous, notice increased fatigue or weakness, or feel worse after 1 week of onset of symptoms.   Increase consumption of water intake and rest.  It was a pleasure to see you today!  Upper Respiratory Infection, Adult Most upper respiratory infections (URIs) are a viral infection of the air passages leading to the lungs. A URI affects the nose, throat, and upper air passages. The most common type of URI is nasopharyngitis and is typically referred to as "the common cold." URIs run their course and usually go away on their own. Most of the time, a URI does not require medical attention, but sometimes a bacterial infection in the upper airways can follow a viral infection. This is called a secondary infection. Sinus and middle ear infections are common types of secondary upper respiratory infections. Bacterial pneumonia can also complicate a URI. A URI can worsen asthma and chronic obstructive pulmonary disease (COPD). Sometimes, these complications can require emergency medical care and may be life threatening.  CAUSES Almost all URIs are caused by viruses. A virus is a type of germ and can spread from one person to another.  RISKS FACTORS You may be at risk for a URI if:   You smoke.   You have chronic heart or lung disease.  You have a weakened defense (immune) system.   You are very young or very old.   You have nasal allergies or asthma.  You work in  crowded or poorly ventilated areas.  You work in health care facilities or schools. SIGNS AND SYMPTOMS  Symptoms typically develop 2-3 days after you come in contact with a cold virus. Most viral URIs last 7-10 days. However, viral URIs from the influenza virus (flu virus) can last 14-18 days and are typically more severe. Symptoms may include:   Runny or stuffy (congested) nose.   Sneezing.   Cough.   Sore throat.   Headache.   Fatigue.   Fever.   Loss of appetite.   Pain in your forehead, behind your eyes, and over your cheekbones (sinus pain).  Muscle aches.  DIAGNOSIS  Your health care provider may diagnose a URI by:  Physical exam.  Tests to check that your symptoms are not due to another condition such as:  Strep throat.  Sinusitis.  Pneumonia.  Asthma. TREATMENT  A URI goes away on its own with time. It cannot be cured with medicines, but medicines may be prescribed or recommended to relieve symptoms. Medicines may help:  Reduce your fever.  Reduce your cough.  Relieve nasal congestion. HOME CARE INSTRUCTIONS   Take medicines only as directed by your health care provider.   Gargle warm saltwater or take cough drops to comfort your throat as directed by your health care provider.  Use a warm mist humidifier or inhale steam from a shower to increase air moisture. This may make it easier to breathe.  Drink enough fluid to keep your urine clear  or pale yellow.   Eat soups and other clear broths and maintain good nutrition.   Rest as needed.   Return to work when your temperature has returned to normal or as your health care provider advises. You may need to stay home longer to avoid infecting others. You can also use a face mask and careful hand washing to prevent spread of the virus.  Increase the usage of your inhaler if you have asthma.   Do not use any tobacco products, including cigarettes, chewing tobacco, or electronic  cigarettes. If you need help quitting, ask your health care provider. PREVENTION  The best way to protect yourself from getting a cold is to practice good hygiene.   Avoid oral or hand contact with people with cold symptoms.   Wash your hands often if contact occurs.  There is no clear evidence that vitamin C, vitamin E, echinacea, or exercise reduces the chance of developing a cold. However, it is always recommended to get plenty of rest, exercise, and practice good nutrition.  SEEK MEDICAL CARE IF:   You are getting worse rather than better.   Your symptoms are not controlled by medicine.   You have chills.  You have worsening shortness of breath.  You have brown or red mucus.  You have yellow or brown nasal discharge.  You have pain in your face, especially when you bend forward.  You have a fever.  You have swollen neck glands.  You have pain while swallowing.  You have white areas in the back of your throat. SEEK IMMEDIATE MEDICAL CARE IF:   You have severe or persistent:  Headache.  Ear pain.  Sinus pain.  Chest pain.  You have chronic lung disease and any of the following:  Wheezing.  Prolonged cough.  Coughing up blood.  A change in your usual mucus.  You have a stiff neck.  You have changes in your:  Vision.  Hearing.  Thinking.  Mood. MAKE SURE YOU:   Understand these instructions.  Will watch your condition.  Will get help right away if you are not doing well or get worse.   This information is not intended to replace advice given to you by your health care provider. Make sure you discuss any questions you have with your health care provider.   Document Released: 08/30/2000 Document Revised: 07/21/2014 Document Reviewed: 06/11/2013 Elsevier Interactive Patient Education [employer].

## 2015-05-19 NOTE — Progress Notes (Signed)
Subjective:    Patient ID: [name], female    DOB: 1966-02-27, 50 y.o.   MRN: [id number]  HPI  [name] is a 50 year old female who presents today with multiple complaints.  1) Urinary Frequency: Also with dysuria, hematuria, flank pain, pelvic pressure. She does endorse vaginal discharge that has been present for months, was treated in December 2016 for BV, she's unsure if the infection ever cleared as she's continued to notice discharge. Denies vaginal itching. She's increased consumption of water. She's not taken anything OTC for her symptoms. Her symptoms have been present for the past 2 days.   2) Sore Throat: Also with ear pain, fever (100.1 last night), headache. Her symptoms have been present since Monday at lunch this week. She's had chest congestion for several weeks. She's taken tylenol, mucinex, and using her albuterol inhaler with temporary improvement. Her granddaughter tested positive for strep 2 weeks ago.  Review of Systems  Constitutional: Positive for fever.  HENT: Positive for congestion, ear pain, sinus pressure and sore throat.   Respiratory: Positive for cough and wheezing. Negative for shortness of breath.   Genitourinary: Positive for dysuria, frequency, hematuria, flank pain and pelvic pain.       Past Medical History  Diagnosis Date  . Diabetes mellitus   . Hypertension   . Anemia   . Migraines   . Thyroid disease   . Colon polyp   . IBS (irritable bowel syndrome)   . Fatigue   . Night sweats   . Lump in female breast   . Asthma   . Sinus problem   . Reflux   . Methicillin resistant Staphylococcus aureus in conditions classified elsewhere and of unspecified site   . Paresthesias   . Migraine, unspecified, without mention of intractable migraine without mention of status migrainosus   . Unspecified acute reaction to stress   . Nonspecific abnormal results of thyroid function study   . Essential hypertension, benign   . Personal history of  colonic polyps   . Pure hypercholesterolemia   . Type II or unspecified type diabetes mellitus without mention of complication, not stated as uncontrolled   . Morbid obesity ([place])   . Other B-complex deficiencies   . Edema   . Unspecified sleep apnea   . Symptomatic states associated with artificial menopause   . Allergy   . Arthritis   . Anxiety     Social History   Social History  . Marital Status: Married    Spouse Name: N/A  . Number of Children: 3  . Years of Education: college   Occupational History  . budget analyst [employer]   Social History Main Topics  . Smoking status: Never Smoker   . Smokeless tobacco: Not on file  . Alcohol Use: 0.0 oz/week    0 drink(s) per week     Comment: rarely - once per week at most  . Drug Use: No  . Sexual Activity: No   Other Topics Concern  . Not on file   Social History Narrative   Exercise: walking two days per week, 30 minutes.       Married x 29 years, happily married; no abuse.       Children: 3 children; 1 grandchild; two step grandchildren; 1 gg.      Lives: with husband, mother-in-law.      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      Tobacco: never  Alcohol: socially.  One glass of wine per month.      Exercise:  Walking around campus.        Seatbelt: 100%      Guns: gun safe in garage.    Past Surgical History  Procedure Laterality Date  . Cholecystectomy  03/1989  . Abdominal hysterectomy  1997    complete in 1997, partial was in 1995  . Knee surgery  2006  . Laparoscopic gastric banding  05/2009  . Cesarean section  1987  and 1989  . Abcess removal  1997    MRSA  . Tonsillectomy and adenoidectomy  1974  . Tubal ligation    . Colonoscopy  03/21/2011    Normal.  [facility].  . Carpal tunnel release r  07/03/13    [facility]    Family History  Problem Relation Age of Onset  . Diabetes Mother   . Heart disease Mother 47    CHF, CAD  . Other Mother     muscle disease  . Hypertension  Mother   . Hyperlipidemia Mother   . Arthritis Mother     OA hip L s/p THR  . Diabetes Father   . Heart disease Father     AMI/CABG/valve replacement age 27.  . Stroke Father   . Diabetes Brother   . Heart disease Brother     stent age 43.  . Breast cancer      2 Aunts  . Fibromyalgia Daughter   . Crohn's disease Daughter   . Hypertension Son   . Alzheimer's disease Maternal Grandmother   . Emphysema Maternal Grandfather   . Heart disease Paternal Grandmother     Allergies  Allergen Reactions  . Amoxicillin Shortness Of Breath and Rash  . Levofloxacin Shortness Of Breath and Rash  . Meloxicam Hives and Rash  . Vancomycin Anaphylaxis  . Ace Inhibitors Cough  . Codeine   . Meperidine Hcl   . Oxycodone-Acetaminophen   . Oxycodone-Acetaminophen   . Penicillins Swelling and Rash  . Tequin [Gatifloxacin] Rash    Current Outpatient Prescriptions on File Prior to Visit  Medication Sig Dispense Refill  . albuterol (PROVENTIL HFA;VENTOLIN HFA) 108 (90 BASE) MCG/ACT inhaler Inhale 2 puffs into the lungs every 6 (six) hours as needed for wheezing. 1 Inhaler 1  . ALPRAZolam (XANAX) 0.5 MG tablet TAKE 1 TABLET BY MOUTH EVERY NIGHT AT BEDTIME AS NEEDED. 30 tablet 5  . atorvastatin (LIPITOR) 20 MG tablet Take 1 tablet (20 mg total) by mouth daily. 90 tablet 3  . azelastine (ASTELIN) 0.1 % nasal spray Place 2 sprays into both nostrils 2 (two) times daily. Use in each nostril as directed 30 mL 11  . beclomethasone (QVAR) 80 MCG/ACT inhaler Inhale 2 puffs into the lungs every 12 (twelve) hours as needed. 1 Inhaler 11  . CALCIUM PO Take by mouth 3 (three) times daily.      . Cetirizine HCl (ZYRTEC PO) Take by mouth daily.      . cyclobenzaprine (FLEXERIL) 5 MG tablet One at bedtime for shoulder pain 30 tablet 1  . Fexofenadine HCl (ALLEGRA PO) Take by mouth.      [name] FLUoxetine (PROZAC) 20 MG tablet Take 2 tablets (40 mg total) by mouth daily. 180 tablet 3  . fluticasone (FLONASE) 50  MCG/ACT nasal spray Place 2 sprays into both nostrils daily. 16 g 11  . gabapentin (NEURONTIN) 300 MG capsule One pill tid 90 capsule 5  . glipiZIDE-metformin (METAGLIP) 2.5-500 MG tablet  TAKE TWO TABLETS BY MOUTH TWICE DAILY BEFORE A MEAL 360 tablet 0  . hydrochlorothiazide (HYDRODIURIL) 25 MG tablet TAKE 1/2 TABLET BY MOUTH ONCE DAILY 45 tablet 0  . Insulin Detemir (LEVEMIR) 100 UNIT/ML Pen Inject 50 Units into the skin daily at 10 pm. 15 mL 11  . Insulin Glargine (LANTUS SOLOSTAR) 100 UNIT/ML Solostar Pen Inject 50 Units into the skin daily at 10 pm. 15 mL 11  . ketoconazole (NIZORAL) 2 % cream Apply topically 2 (two) times daily. 60 g 0  . levalbuterol (XOPENEX) 1.25 MG/3ML nebulizer solution Take 1.25 mg by nebulization daily as needed.    [name] losartan (COZAAR) 100 MG tablet TAKE 1/2 TABLET BY MOUTH ONCE DAILY 90 tablet 0  . metroNIDAZOLE (METROGEL) 0.75 % vaginal gel PLACE 1 APPLICATORFUL VAGINALLY 2 (TWO) TIMES DAILY 70 g 0  . Multiple Vitamin (MULTIVITAMIN PO) Take by mouth daily.      [name] Nystatin (NYAMYC) 100000 UNIT/GM POWD Apply to affected area twice daily. 60 g 5  . nystatin-triamcinolone (MYCOLOG II) cream Apply topically 2 (two) times daily. 60 g 3  . ondansetron (ZOFRAN ODT) 4 MG disintegrating tablet Take 1 tablet (4 mg total) by mouth every 8 (eight) hours as needed for nausea. 10 tablet 0  . sitaGLIPtin (JANUVIA) 100 MG tablet TAKE 1 TABLET BY MOUTH DAILY 30 tablet 11  . traMADol (ULTRAM) 50 MG tablet Take 1 tablet (50 mg total) by mouth every 8 (eight) hours as needed. 30 tablet 0   Current Facility-Administered Medications on File Prior to Visit  Medication Dose Route Frequency Provider Last Rate Last Dose  . hepatitis B vac recombinant (RECOMBIVAX) injection 5 mcg  0.5 mL Intramuscular Once Wardell Honour, MD        BP 132/70 mmHg  Pulse 108  Temp(Src) 98.6 F (37 C) (Oral)  Ht 5' 6.75" (1.695 m)  Wt 308 lb 6.4 oz (139.889 kg)  BMI 48.69 kg/m2  SpO2  98%    Objective:   Physical Exam  Constitutional: She appears well-nourished.  HENT:  Right Ear: Tympanic membrane and ear canal normal.  Left Ear: Tympanic membrane and ear canal normal.  Nose: Right sinus exhibits maxillary sinus tenderness. Right sinus exhibits no frontal sinus tenderness. Left sinus exhibits maxillary sinus tenderness. Left sinus exhibits no frontal sinus tenderness.  Mouth/Throat: Posterior oropharyngeal erythema present. No oropharyngeal exudate or posterior oropharyngeal edema.  Eyes: Conjunctivae are normal.  Neck: Neck supple.  Cardiovascular: Regular rhythm.   Sinus tachycardia  Pulmonary/Chest: Effort normal and breath sounds normal.  Mild wheezing to right lower lobe.  Abdominal: There is no CVA tenderness.  Lymphadenopathy:    She has cervical adenopathy.  Skin: Skin is warm and dry.          Assessment & Plan:  Sore throat:  Present since Monday this week with cough, sinus pressure, fevers. Granddaughter diagnosed with strep 2 weeks ago. Exam with mild erythema to posterior pharynx, overall unremarkable. Lungs clear, HENT exam otherwise normal. Do not suspect strep. Suspect fevers may be due to a viral cause and will treat with supportive measures.  Rapid strep: Negative   Urinary Frequency:   Also with dysuria, hematuria, flank pain, pelvic pressure x 2 days. Not taking OTC's for symptoms. UA: Negative for leuks or nitrites, 3+ protein, no blood.  Will obtain vaginal swab to test for BV and yeast. RX for pyridium provided for pelvic symptoms. Strict return precautions provided.

## 2015-05-20 ENCOUNTER — Telehealth: Payer: Self-pay | Admitting: Family Medicine

## 2015-05-20 LAB — WET PREP BY MOLECULAR PROBE
Candida species: NEGATIVE
Gardnerella vaginalis: NEGATIVE
Trichomonas vaginosis: NEGATIVE

## 2015-05-20 NOTE — Telephone Encounter (Signed)
Patient returned Chan's call. °

## 2015-05-20 NOTE — Telephone Encounter (Signed)
Called and notified patient of Kate's comments. Patient verbalized understanding.  

## 2015-05-27 ENCOUNTER — Encounter: Payer: Self-pay | Admitting: Primary Care

## 2015-05-27 ENCOUNTER — Ambulatory Visit (INDEPENDENT_AMBULATORY_CARE_PROVIDER_SITE_OTHER): Payer: BC Managed Care – PPO | Admitting: Primary Care

## 2015-05-27 VITALS — BP 124/72 | HR 94 | Temp 98.0°F | Ht 67.0 in | Wt 305.0 lb

## 2015-05-27 DIAGNOSIS — J302 Other seasonal allergic rhinitis: Secondary | ICD-10-CM | POA: Diagnosis not present

## 2015-05-27 DIAGNOSIS — R05 Cough: Secondary | ICD-10-CM

## 2015-05-27 DIAGNOSIS — Z794 Long term (current) use of insulin: Secondary | ICD-10-CM

## 2015-05-27 DIAGNOSIS — E785 Hyperlipidemia, unspecified: Secondary | ICD-10-CM | POA: Diagnosis not present

## 2015-05-27 DIAGNOSIS — E119 Type 2 diabetes mellitus without complications: Secondary | ICD-10-CM

## 2015-05-27 DIAGNOSIS — F419 Anxiety disorder, unspecified: Secondary | ICD-10-CM

## 2015-05-27 DIAGNOSIS — I10 Essential (primary) hypertension: Secondary | ICD-10-CM

## 2015-05-27 DIAGNOSIS — J4520 Mild intermittent asthma, uncomplicated: Secondary | ICD-10-CM

## 2015-05-27 DIAGNOSIS — F411 Generalized anxiety disorder: Secondary | ICD-10-CM

## 2015-05-27 DIAGNOSIS — R059 Cough, unspecified: Secondary | ICD-10-CM

## 2015-05-27 LAB — LDL CHOLESTEROL, DIRECT: LDL DIRECT: 114 mg/dL

## 2015-05-27 LAB — LIPID PANEL
CHOL/HDL RATIO: 5
Cholesterol: 276 mg/dL — ABNORMAL HIGH (ref 0–200)
HDL: 50.8 mg/dL (ref >39.00)
Triglycerides: 456 mg/dL — ABNORMAL HIGH (ref 0.0–149.0)

## 2015-05-27 LAB — HEMOGLOBIN A1C: Hgb A1c MFr Bld: 10 % — ABNORMAL HIGH (ref 4.6–6.5)

## 2015-05-27 MED ORDER — AZITHROMYCIN 250 MG PO TABS: ORAL_TABLET | ORAL | Status: DC

## 2015-05-27 MED ORDER — SITAGLIPTIN PHOSPHATE 100 MG PO TABS: ORAL_TABLET | ORAL | Status: DC

## 2015-05-27 MED ORDER — GLIPIZIDE-METFORMIN HCL 2.5-500 MG PO TABS: 2.0000 | ORAL_TABLET | Freq: Two times a day (BID) | ORAL | Status: DC

## 2015-05-27 MED ORDER — AZELASTINE HCL 0.1 % NA SOLN
2.0000 | Freq: Two times a day (BID) | NASAL | Status: DC
Start: 2015-05-27 — End: 2017-07-21

## 2015-05-27 MED ORDER — ALPRAZOLAM 0.5 MG PO TABS: 0.5000 mg | ORAL_TABLET | Freq: Every evening | ORAL | Status: DC | PRN

## 2015-05-27 NOTE — Assessment & Plan Note (Signed)
Currently managed on HCTZ 12.5 mg and Losartan 50 mg daily. BP stable today. Continue current regimen.  Last CMP stable.

## 2015-05-27 NOTE — Progress Notes (Signed)
Pre visit review using our clinic review tool, if applicable. No additional management support is needed unless otherwise documented below in the visit note. 

## 2015-05-27 NOTE — Assessment & Plan Note (Signed)
Long history of. Managed on Qvar, albuterol HFA, xopenex nebulized treatments. Continue current regimen. No wheezing noted during exam.

## 2015-05-27 NOTE — Patient Instructions (Addendum)
Complete lab work prior to leaving today. I will notify you of your results once received.   Refills were sent of your Januvia, Metformin, and Azelastine nasal spray. I've printed a new RX for your alprazolam.  Start Azithromycin antibiotics. Take 2 tablets by mouth today, then 1 tablet daily for 4 additional days.  Cough: Robitussin or Delsym. This may be purchased over the counter.   Please notify me if no improvement in your symptoms in 3-4 days.  Please schedule a physical with me in 6 months. You may also schedule a lab only appointment 3-4 days prior. We will discuss your lab results in detail during your physical.  It was a pleasure to see you today! Please don't hesitate to call me with any questions. Welcome to [facility]!

## 2015-05-27 NOTE — Progress Notes (Signed)
Subjective:    Patient ID: [name], female    DOB: [birth year], 50 y.o.   MRN: [id number]  HPI  [name] is a 50 year old female who presents today to establish care and discuss the problems mentioned below. Will review old records.  1) Essential Hypertension: Diagnosed years ago. Currently managed on HCTZ 12.5 mg, losartan 50 mg. Denies chest pain, dizziness.   2) Asthma/Allergic Rhinitis: Diagnosed in her 20's. Currently managed on QVAR, Xopenex nebulized solution, and albuterol inhaler. Will have asthma attacks twice annually. Managed on Azelastine 0.1% nasal spray at night and Flonase in the morning. Also managed on Tramadol PRN for chronic cough.   3) Type 2 Diabetes: Diagnosed in 1997. Currently managed on Glipizide-Metformin 2.5/500 mg (2 tablets BID), Levemir 50 units HS, and Januvia 100 mg. She checks her sugars irregularly. Her last A1C was several months ago and endorsed elevated readings. She is due for recheck.  4) Hyperlipidemia: Currently managed on atorvastatin 20 mg. Last lipid panel was elevated in October 2016. She does endorse infrequently taking her medication. History of elevated LFT's and had ultrasound in January 2017 which was unremarkable.   5) Generalized Anxiety Disorder: Currently managed on Fluoxetine 20 mg and alprazolam 0.5 mg. She takes the alprazolam every night at bedtime for sleep and has been on this medication for years.  6) Shoulder and Neck Pain: Currently taking Gapapentin 200 mg HS prior prior PCP, with improvement.  7) Cough: Present for the past 2.5 weeks. Was evaluated on 05/19/15 with similar symptoms and was treated with supportive measures for suspected viral involvement. She's taken Mucinex, Flonase, and allergy medications without improvement. Overall she's feeling fatigued and has experienced no improvement in cough.  Review of Systems  Constitutional: Positive for chills and fatigue. Negative for fever.  HENT: Positive for  congestion and sinus pressure.   Respiratory: Positive for cough and shortness of breath.   Cardiovascular: Negative for chest pain.  Musculoskeletal: Positive for arthralgias.  Neurological: Negative for dizziness and headaches.  Psychiatric/Behavioral: Negative for sleep disturbance. The patient is not nervous/anxious.        Past Medical History  Diagnosis Date  . Diabetes mellitus   . Hypertension   . Anemia   . Migraines   . Thyroid disease   . Colon polyp   . IBS (irritable bowel syndrome)   . Fatigue   . Night sweats   . Lump in female breast   . Asthma   . Sinus problem   . Reflux   . Methicillin resistant Staphylococcus aureus in conditions classified elsewhere and of unspecified site   . Paresthesias   . Migraine, unspecified, without mention of intractable migraine without mention of status migrainosus   . Unspecified acute reaction to stress   . Nonspecific abnormal results of thyroid function study   . Essential hypertension, benign   . Personal history of colonic polyps   . Pure hypercholesterolemia   . Type II or unspecified type diabetes mellitus without mention of complication, not stated as uncontrolled   . Morbid obesity ([place])   . Other B-complex deficiencies   . Edema   . Unspecified sleep apnea   . Symptomatic states associated with artificial menopause   . Allergy   . Arthritis   . Anxiety     Social History   Social History  . Marital Status: Married    Spouse Name: N/A  . Number of Children: 3  . 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 Smoking status: Never Smoker   . Smokeless tobacco: Not on file  . Alcohol Use: 0.0 oz/week    0 drink(s) per week     Comment: rarely - once per week at most  . Drug Use: No  . Sexual Activity: No   Other Topics Concern  . Not on file   Social History Narrative   Exercise: walking two days per week, 30 minutes.       Married x  29 years, happily married; no abuse.       Children: 3 children; 1 grandchild; two step grandchildren; 1 gg.      Lives: with husband, mother-in-law.      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      Tobacco: never      Alcohol: socially.  One glass of wine per month.      Exercise:  Walking around campus.        Seatbelt: 100%      Guns: gun safe in garage.    Past Surgical History  Procedure Laterality Date  . Cholecystectomy  03/1989  . Abdominal hysterectomy  1997    complete in 1997, partial was in 1995  . Knee surgery  2006  . Laparoscopic gastric banding  05/2009  . Cesarean section  1987  and 1989  . Abcess removal  1997    MRSA  . Tonsillectomy and adenoidectomy  1974  . Tubal ligation    . Colonoscopy  03/21/2011    Normal.  [facility].  . Carpal tunnel release r  07/03/13    [facility]    Family History  Problem Relation Age of Onset  . Diabetes Mother   . Heart disease Mother 18    CHF, CAD  . Other Mother     muscle disease  . Hypertension Mother   . Hyperlipidemia Mother   . Arthritis Mother     OA hip L s/p THR  . Diabetes Father   . Heart disease Father     AMI/CABG/valve replacement age 2.  . Stroke Father   . Diabetes Brother   . Heart disease Brother     stent age 61.  . Breast cancer      2 Aunts  . Fibromyalgia Daughter   . Crohn's disease Daughter   . Hypertension Son   . Alzheimer's disease Maternal Grandmother   . Emphysema Maternal Grandfather   . Heart disease Paternal Grandmother     Allergies  Allergen Reactions  . Amoxicillin Shortness Of Breath and Rash  . Levofloxacin Shortness Of Breath and Rash  . Meloxicam Hives and Rash  . Vancomycin Anaphylaxis  . Ace Inhibitors Cough  . Codeine   . Meperidine Hcl   . Oxycodone-Acetaminophen   . Oxycodone-Acetaminophen   . Penicillins Swelling and Rash  . Tequin [Gatifloxacin] Rash    Current Outpatient Prescriptions on File Prior to Visit  Medication Sig Dispense  Refill  . albuterol (PROVENTIL HFA;VENTOLIN HFA) 108 (90 BASE) MCG/ACT inhaler Inhale 2 puffs into the lungs every 6 (six) hours as needed for wheezing. 1 Inhaler 1  . atorvastatin (LIPITOR) 20 MG tablet Take 1 tablet (20 mg total) by mouth daily. 90 tablet 3  . beclomethasone (QVAR) 80 MCG/ACT inhaler Inhale 2 puffs into the lungs every 12 (twelve) hours as needed. 1 Inhaler 11  . CALCIUM PO Take by mouth 3 (three) times  daily.      . Cetirizine HCl (ZYRTEC PO) Take by mouth daily.      . cyclobenzaprine (FLEXERIL) 5 MG tablet One at bedtime for shoulder pain 30 tablet 1  . Fexofenadine HCl (ALLEGRA PO) Take by mouth.      [name] FLUoxetine (PROZAC) 20 MG tablet Take 2 tablets (40 mg total) by mouth daily. 180 tablet 3  . fluticasone (FLONASE) 50 MCG/ACT nasal spray Place 2 sprays into both nostrils daily. 16 g 11  . gabapentin (NEURONTIN) 300 MG capsule One pill tid 90 capsule 5  . hydrochlorothiazide (HYDRODIURIL) 25 MG tablet TAKE 1/2 TABLET BY MOUTH ONCE DAILY 45 tablet 0  . Insulin Detemir (LEVEMIR) 100 UNIT/ML Pen Inject 50 Units into the skin daily at 10 pm. 15 mL 11  . ketoconazole (NIZORAL) 2 % cream Apply topically 2 (two) times daily. 60 g 0  . levalbuterol (XOPENEX) 1.25 MG/3ML nebulizer solution Take 1.25 mg by nebulization daily as needed.    [name] losartan (COZAAR) 100 MG tablet TAKE 1/2 TABLET BY MOUTH ONCE DAILY 90 tablet 0  . Multiple Vitamin (MULTIVITAMIN PO) Take by mouth daily.      [name] Nystatin (NYAMYC) 100000 UNIT/GM POWD Apply to affected area twice daily. 60 g 5  . nystatin-triamcinolone (MYCOLOG II) cream Apply topically 2 (two) times daily. 60 g 3  . ondansetron (ZOFRAN ODT) 4 MG disintegrating tablet Take 1 tablet (4 mg total) by mouth every 8 (eight) hours as needed for nausea. 10 tablet 0  . phenazopyridine (PYRIDIUM) 200 MG tablet Take 1 tablet (200 mg total) by mouth 3 (three) times daily as needed for pain. 15 tablet 0  . traMADol (ULTRAM) 50 MG tablet Take 1 tablet (50 mg  total) by mouth every 8 (eight) hours as needed. (Patient not taking: Reported on 05/27/2015) 30 tablet 0   Current Facility-Administered Medications on File Prior to Visit  Medication Dose Route Frequency Provider Last Rate Last Dose  . hepatitis B vac recombinant (RECOMBIVAX) injection 5 mcg  0.5 mL Intramuscular Once Wardell Honour, MD        BP 124/72 mmHg  Pulse 94  Temp(Src) 98 F (36.7 C) (Oral)  Ht 5\' 7"  (1.702 m)  Wt 305 lb (138.347 kg)  BMI 47.76 kg/m2  SpO2 98%    Objective:   Physical Exam  Constitutional: She appears well-nourished.  HENT:  Right Ear: Tympanic membrane and ear canal normal.  Left Ear: Tympanic membrane and ear canal normal.  Nose: Right sinus exhibits maxillary sinus tenderness. Right sinus exhibits no frontal sinus tenderness. Left sinus exhibits maxillary sinus tenderness. Left sinus exhibits no frontal sinus tenderness.  Mouth/Throat: Oropharynx is clear and moist.  Eyes: Conjunctivae are normal.  Neck: Neck supple.  Cardiovascular: Normal rate and regular rhythm.   Pulmonary/Chest: Effort normal. She has rhonchi in the right upper field and the left upper field.  Lymphadenopathy:    She has no cervical adenopathy.  Skin: Skin is warm and dry.  Psychiatric: She has a normal mood and affect.          Assessment & Plan:  Cough:  Present for 2.5 weeks. No improvement since evaluation 1 week ago. Lungs with rhonchi to upper fields, feeling more fatigued. Due to duration of symptoms, will treat empirically with antibiotics. Start Zpak. Continue OTC/supportive treatment. Return precautions provided.

## 2015-05-27 NOTE — Assessment & Plan Note (Signed)
Managed on atorvastatin 20 mg, lipid panel in October above goal. Infrequently taking meds. Will repeat lipids today.

## 2015-05-27 NOTE — Assessment & Plan Note (Signed)
Managed on Prozac and alprazolam HS for sleep. Feels well managed. Continue to monitor.

## 2015-05-27 NOTE — Assessment & Plan Note (Signed)
Managed on astelin nasal spray every morning and flonase HS. Also taking Zyrtec HS.

## 2015-05-27 NOTE — Assessment & Plan Note (Signed)
Uncontrolled. Last A1C above goal 3 months ago per patient. Will obtain A1C today. Managed on Levemir 50 units HS, Januvia 100 mg, Metformin/Glipizide combination tablet.  Urine microalbumin on file and UTD. Pneumonia vaccination on file, may need updated. Managed on ARB.  Will consider changing Levemir to BID dosing if A1C above goal.

## 2015-05-28 ENCOUNTER — Other Ambulatory Visit: Payer: Self-pay | Admitting: Primary Care

## 2015-05-28 ENCOUNTER — Encounter: Payer: Self-pay | Admitting: Primary Care

## 2015-05-28 DIAGNOSIS — E7800 Pure hypercholesterolemia, unspecified: Secondary | ICD-10-CM

## 2015-05-28 DIAGNOSIS — E119 Type 2 diabetes mellitus without complications: Secondary | ICD-10-CM

## 2015-05-28 DIAGNOSIS — Z794 Long term (current) use of insulin: Principal | ICD-10-CM

## 2015-05-28 MED ORDER — INSULIN DETEMIR 100 UNIT/ML FLEXPEN: PEN_INJECTOR | SUBCUTANEOUS | Status: DC

## 2015-05-28 MED ORDER — ATORVASTATIN CALCIUM 40 MG PO TABS: 40.0000 mg | ORAL_TABLET | Freq: Every day | ORAL | Status: DC

## 2015-06-07 ENCOUNTER — Encounter: Payer: Self-pay | Admitting: Primary Care

## 2015-07-09 ENCOUNTER — Other Ambulatory Visit: Payer: Self-pay | Admitting: Family Medicine

## 2015-07-10 ENCOUNTER — Other Ambulatory Visit: Payer: Self-pay | Admitting: Primary Care

## 2015-07-12 NOTE — Telephone Encounter (Signed)
Electronically refill request for   Nystatin ([place]) 100000 UNIT/GM POWD   Apply to affected area twice daily.  Dispense: 60 g   Refills: 5     Last prescribed 06/10/2014. Rx have not been prescribed by [facility]. Last seen on 05/27/2015. No future appointment.

## 2015-07-27 ENCOUNTER — Encounter: Payer: Self-pay | Admitting: Family Medicine

## 2015-07-28 ENCOUNTER — Encounter: Payer: Self-pay | Admitting: Primary Care

## 2015-07-28 MED ORDER — GABAPENTIN 300 MG PO CAPS: ORAL_CAPSULE | ORAL | Status: DC

## 2015-07-28 NOTE — Telephone Encounter (Signed)
New pt to Kate--never been prescribed by her yet.Marland KitchenMarland KitchenMarland KitchenPlease advise

## 2015-08-11 ENCOUNTER — Encounter: Payer: Self-pay | Admitting: Primary Care

## 2015-08-11 ENCOUNTER — Other Ambulatory Visit: Payer: Self-pay | Admitting: Primary Care

## 2015-08-11 DIAGNOSIS — F411 Generalized anxiety disorder: Secondary | ICD-10-CM

## 2015-08-11 MED ORDER — FLUOXETINE HCL 20 MG PO TABS: 40.0000 mg | ORAL_TABLET | Freq: Every day | ORAL | Status: DC

## 2015-08-19 ENCOUNTER — Other Ambulatory Visit: Payer: Self-pay | Admitting: Family Medicine

## 2015-08-20 ENCOUNTER — Other Ambulatory Visit: Payer: Self-pay | Admitting: Primary Care

## 2015-08-20 MED ORDER — INSULIN PEN NEEDLE 32G X 4 MM MISC: Status: DC

## 2015-08-20 NOTE — Telephone Encounter (Signed)
Received faxed refill request for Unifine Pentips 32g.  Sent the refill to [facility].

## 2015-08-23 ENCOUNTER — Other Ambulatory Visit: Payer: Self-pay | Admitting: Primary Care

## 2015-08-23 ENCOUNTER — Other Ambulatory Visit: Payer: Self-pay | Admitting: Internal Medicine

## 2015-08-23 ENCOUNTER — Encounter: Payer: Self-pay | Admitting: Primary Care

## 2015-08-23 DIAGNOSIS — F411 Generalized anxiety disorder: Secondary | ICD-10-CM

## 2015-08-23 MED ORDER — GABAPENTIN 300 MG PO CAPS: ORAL_CAPSULE | ORAL | Status: DC

## 2015-08-23 MED ORDER — INSULIN PEN NEEDLE 32G X 4 MM MISC: Status: DC

## 2015-08-23 MED ORDER — ALPRAZOLAM 0.5 MG PO TABS: 0.5000 mg | ORAL_TABLET | Freq: Every evening | ORAL | Status: DC | PRN

## 2015-08-23 NOTE — Telephone Encounter (Signed)
Last filled 05/27/2015--please advise

## 2015-08-24 ENCOUNTER — Other Ambulatory Visit: Payer: Self-pay | Admitting: Primary Care

## 2015-08-24 DIAGNOSIS — Z1231 Encounter for screening mammogram for malignant neoplasm of breast: Secondary | ICD-10-CM

## 2015-08-24 NOTE — Telephone Encounter (Signed)
Called in Xanax to [facility]

## 2015-08-24 NOTE — Telephone Encounter (Signed)
Notified patient that Rx has been called in to the pharmacy. Patient verbalized understanding.

## 2015-08-25 ENCOUNTER — Ambulatory Visit
Admission: RE | Admit: 2015-08-25 | Discharge: 2015-08-25 | Disposition: A | Discharge status: Home / Self Care | Payer: BC Managed Care – PPO | Source: Ambulatory Visit | Attending: Primary Care | Admitting: Primary Care

## 2015-08-25 DIAGNOSIS — Z1231 Encounter for screening mammogram for malignant neoplasm of breast: Secondary | ICD-10-CM

## 2015-09-01 ENCOUNTER — Other Ambulatory Visit: Payer: Self-pay | Admitting: Family Medicine

## 2015-09-02 ENCOUNTER — Other Ambulatory Visit: Payer: Self-pay | Admitting: Primary Care

## 2015-09-02 MED ORDER — HYDROCHLOROTHIAZIDE 25 MG PO TABS: 12.5000 mg | ORAL_TABLET | Freq: Every day | ORAL | Status: DC

## 2015-09-03 ENCOUNTER — Other Ambulatory Visit: Payer: Self-pay | Admitting: Family Medicine

## 2015-09-03 ENCOUNTER — Encounter: Payer: Self-pay | Admitting: Primary Care

## 2015-09-03 MED ORDER — KETOCONAZOLE 2 % EX CREA: 1.0000 "application " | TOPICAL_CREAM | Freq: Two times a day (BID) | CUTANEOUS | Status: DC | PRN

## 2015-09-03 NOTE — Addendum Note (Signed)
Addended by: Wardell Honour on: 09/03/2015 09:01 PM   Modules accepted: Orders

## 2015-09-06 NOTE — Telephone Encounter (Signed)
This appears to be filled 09/02/2015 by [facility] Friendly for both the nizoral cream and the hctz

## 2015-09-08 MED ORDER — HYDROCHLOROTHIAZIDE 25 MG PO TABS: 12.5000 mg | ORAL_TABLET | Freq: Every day | ORAL | Status: DC

## 2015-09-08 MED ORDER — KETOCONAZOLE 2 % EX CREA: 1.0000 "application " | TOPICAL_CREAM | Freq: Two times a day (BID) | CUTANEOUS | Status: DC | PRN

## 2015-09-08 NOTE — Telephone Encounter (Signed)
Based on chart review, the patient has a new PCP at [facility]. I've refilled these medications today, but additional refill requests should go to her new PCP.  Meds ordered this encounter  Medications  . ketoconazole (NIZORAL) 2 % cream    Sig: Apply 1 application topically 2 (two) times daily as needed for irritation.    Dispense:  60 g    Refill:  0    Order Specific Question:  Supervising Provider    Answer:  DOOLITTLE, ROBERT P [id number]  . hydrochlorothiazide (HYDRODIURIL) 25 MG tablet    Sig: Take 0.5 tablets (12.5 mg total) by mouth daily.    Dispense:  45 tablet    Refill:  0    Order Specific Question:  Supervising Provider    Answer:  DOOLITTLE, ROBERT P [id number]

## 2015-09-08 NOTE — Addendum Note (Signed)
Addended by: Fara Chute on: 09/08/2015 11:58 AM   Modules accepted: Orders

## 2015-09-08 NOTE — Telephone Encounter (Signed)
Pt's new PCP has Rxd these for her.

## 2015-10-11 ENCOUNTER — Other Ambulatory Visit: Payer: Self-pay | Admitting: Primary Care

## 2015-10-11 DIAGNOSIS — Z794 Long term (current) use of insulin: Principal | ICD-10-CM

## 2015-10-11 DIAGNOSIS — E1140 Type 2 diabetes mellitus with diabetic neuropathy, unspecified: Secondary | ICD-10-CM

## 2015-10-12 MED ORDER — GABAPENTIN 300 MG PO CAPS: ORAL_CAPSULE | ORAL | 1 refills | Status: DC

## 2015-11-08 ENCOUNTER — Other Ambulatory Visit: Payer: Self-pay | Admitting: Family Medicine

## 2015-11-08 DIAGNOSIS — I10 Essential (primary) hypertension: Secondary | ICD-10-CM

## 2015-11-16 ENCOUNTER — Encounter: Payer: Self-pay | Admitting: Primary Care

## 2015-11-16 DIAGNOSIS — I10 Essential (primary) hypertension: Secondary | ICD-10-CM

## 2015-12-03 ENCOUNTER — Ambulatory Visit: Payer: Self-pay | Admitting: Primary Care

## 2015-12-07 ENCOUNTER — Encounter: Payer: Self-pay | Admitting: Primary Care

## 2015-12-07 ENCOUNTER — Other Ambulatory Visit: Payer: Self-pay | Admitting: Physician Assistant

## 2015-12-07 ENCOUNTER — Other Ambulatory Visit: Payer: Self-pay | Admitting: Primary Care

## 2015-12-07 DIAGNOSIS — F411 Generalized anxiety disorder: Secondary | ICD-10-CM

## 2015-12-07 DIAGNOSIS — I10 Essential (primary) hypertension: Secondary | ICD-10-CM

## 2015-12-07 MED ORDER — HYDROCHLOROTHIAZIDE 25 MG PO TABS: 12.5000 mg | ORAL_TABLET | Freq: Every day | ORAL | 3 refills | Status: DC

## 2015-12-07 NOTE — Telephone Encounter (Signed)
Ok to refill? Electronically refill request for   ALPRAZolam Duanne Moron) 0.5 MG tablet Last prescribed on 08/23/2015. Last seen on 05/27/2015. Follow up on 01/10/2016.

## 2015-12-07 NOTE — Telephone Encounter (Signed)
Ok to refill? Electronically refill request for   hydrochlorothiazide (HYDRODIURIL) 25 MG tablet  Medication have not been prescribed by [facility]. Last seen on 05/27/15. Follow up on 01/10/16.

## 2015-12-09 MED ORDER — ALPRAZOLAM 0.5 MG PO TABS: 0.5000 mg | ORAL_TABLET | Freq: Every evening | ORAL | 0 refills | Status: DC | PRN

## 2015-12-09 NOTE — Telephone Encounter (Signed)
Called in medication to the pharmacy as instructed. 

## 2015-12-12 ENCOUNTER — Encounter: Payer: Self-pay | Admitting: Primary Care

## 2015-12-12 DIAGNOSIS — E119 Type 2 diabetes mellitus without complications: Secondary | ICD-10-CM

## 2015-12-12 DIAGNOSIS — Z794 Long term (current) use of insulin: Principal | ICD-10-CM

## 2015-12-13 MED ORDER — SITAGLIPTIN PHOSPHATE 100 MG PO TABS: ORAL_TABLET | ORAL | 1 refills | Status: DC

## 2015-12-20 ENCOUNTER — Ambulatory Visit (INDEPENDENT_AMBULATORY_CARE_PROVIDER_SITE_OTHER): Payer: BC Managed Care – PPO | Admitting: Primary Care

## 2015-12-20 ENCOUNTER — Encounter: Payer: Self-pay | Admitting: Primary Care

## 2015-12-20 VITALS — BP 124/72 | HR 92 | Temp 98.0°F | Ht 66.75 in | Wt 301.8 lb

## 2015-12-20 DIAGNOSIS — L089 Local infection of the skin and subcutaneous tissue, unspecified: Secondary | ICD-10-CM

## 2015-12-20 MED ORDER — DOXYCYCLINE HYCLATE 100 MG PO TABS: 100.0000 mg | ORAL_TABLET | Freq: Two times a day (BID) | ORAL | 0 refills | Status: DC

## 2015-12-20 NOTE — Progress Notes (Signed)
Pre visit review using our clinic review tool, if applicable. No additional management support is needed unless otherwise documented below in the visit note. 

## 2015-12-20 NOTE — Progress Notes (Signed)
Subjective:    Patient ID: [name], female    DOB: 04-21-1965, 50 y.o.   MRN: [id number]  HPI  Ms. Guzy is a 50 year old female with a history of type 2 diabetes who presents today with a chief complaint of finger pain. The pain is located to the tip of the second digit of her right hand which has been present for a little over 1 month. Her symptoms include erythema, drainage, discomfort. She's been soaking her finger and applying tea tree oil with temporary improvement, but over the past 2 weeks the improvement halted. She believes her infection came from smashing her finger in her granddaughters car seat 1 month ago. Denies recent fevers, chills.   Review of Systems  Constitutional: Negative for chills and fever.  Skin: Positive for color change and wound.  Neurological: Negative for weakness and numbness.       Past Medical History:  Diagnosis Date  . Allergy   . Anemia   . Anxiety   . Arthritis   . Asthma   . Colon polyp   . Diabetes mellitus   . Edema   . Essential hypertension, benign   . Fatigue   . Hypertension   . IBS (irritable bowel syndrome)   . Lump in female breast   . Methicillin resistant Staphylococcus aureus in conditions classified elsewhere and of unspecified site   . Migraine, unspecified, without mention of intractable migraine without mention of status migrainosus   . Migraines   . Morbid obesity ([place])   . Night sweats   . Nonspecific abnormal results of thyroid function study   . Other B-complex deficiencies   . Paresthesias   . Personal history of colonic polyps   . Pure hypercholesterolemia   . Reflux   . Sinus problem   . Symptomatic states associated with artificial menopause   . Thyroid disease   . Type II or unspecified type diabetes mellitus without mention of complication, not stated as uncontrolled   . Unspecified acute reaction to stress   . Unspecified sleep apnea      Social History   Social History  . Marital status:  Married    Spouse name: N/A  . Number of children: 3  . 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 Smoking status: Never Smoker  . Smokeless tobacco: Not on file  . Alcohol use 0.0 oz/week    0 drink(s) per week     Comment: rarely - once per week at most  . Drug use: No  . Sexual activity: No   Other Topics Concern  . Not on file   Social History Narrative   Exercise: walking two days per week, 30 minutes.       Married x 29 years, happily married; no abuse.       Children: 3 children; 1 grandchild; two step grandchildren; 1 gg.      Lives: with husband, mother-in-law.      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      Tobacco: never      Alcohol: socially.  One glass of wine per month.      Exercise:  Walking around campus.        Seatbelt: 100%      Guns: gun safe in garage.    Past Surgical History:  Procedure Laterality Date  . abcess removal  1997  MRSA  . ABDOMINAL HYSTERECTOMY  1997   complete in 1997, partial was in 1995  . carpal tunnel release r  07/03/13   [facility]orf  . [place]  . CHOLECYSTECTOMY  03/1989  . COLONOSCOPY  03/21/2011   Normal.  [facility].  [name] KNEE SURGERY  2006  . LAPAROSCOPIC GASTRIC BANDING  05/2009  . TONSILLECTOMY AND ADENOIDECTOMY  1974  . TUBAL LIGATION      Family History  Problem Relation Age of Onset  . Diabetes Mother   . Heart disease Mother 78    CHF, CAD  . Other Mother     muscle disease  . Hypertension Mother   . Hyperlipidemia Mother   . Arthritis Mother     OA hip L s/p THR  . Diabetes Father   . Heart disease Father     AMI/CABG/valve replacement age 37.  . Stroke Father   . Diabetes Brother   . Heart disease Brother     stent age 108.  . Breast cancer      2 Aunts  . Fibromyalgia Daughter   . Crohn's disease Daughter   . Hypertension Son   . Alzheimer's disease Maternal Grandmother   . Emphysema  Maternal Grandfather   . Heart disease Paternal Grandmother     Allergies  Allergen Reactions  . Amoxicillin Shortness Of Breath and Rash  . Levofloxacin Shortness Of Breath and Rash  . Meloxicam Hives and Rash  . Vancomycin Anaphylaxis  . Sulfa Antibiotics Rash  . Ace Inhibitors Cough  . Codeine   . Meperidine Hcl   . Oxycodone-Acetaminophen   . Oxycodone-Acetaminophen   . Penicillins Swelling and Rash  . Tequin [Gatifloxacin] Rash    Current Outpatient Prescriptions on File Prior to Visit  Medication Sig Dispense Refill  . albuterol (PROVENTIL HFA;VENTOLIN HFA) 108 (90 BASE) MCG/ACT inhaler Inhale 2 puffs into the lungs every 6 (six) hours as needed for wheezing. 1 Inhaler 1  . ALPRAZolam (XANAX) 0.5 MG tablet Take 1 tablet (0.5 mg total) by mouth at bedtime as needed for anxiety or sleep. 30 tablet 0  . atorvastatin (LIPITOR) 40 MG tablet Take 1 tablet (40 mg total) by mouth daily. 90 tablet 3  . azelastine (ASTELIN) 0.1 % nasal spray Place 2 sprays into both nostrils 2 (two) times daily. Use in each nostril as directed 30 mL 11  . beclomethasone (QVAR) 80 MCG/ACT inhaler Inhale 2 puffs into the lungs every 12 (twelve) hours as needed. 1 Inhaler 11  . CALCIUM PO Take by mouth 3 (three) times daily.      . Cetirizine HCl (ZYRTEC PO) Take by mouth daily.      . cyclobenzaprine (FLEXERIL) 5 MG tablet One at bedtime for shoulder pain 30 tablet 1  . Fexofenadine HCl (ALLEGRA PO) Take by mouth.      [name] FLUoxetine (PROZAC) 20 MG tablet Take 2 tablets (40 mg total) by mouth daily. 180 tablet 3  . fluticasone (FLONASE) 50 MCG/ACT nasal spray Place 2 sprays into both nostrils daily. 16 g 11  . gabapentin (NEURONTIN) 300 MG capsule One pill tid 270 capsule 1  . glipiZIDE-metformin (METAGLIP) 2.5-500 MG tablet Take 2 tablets by mouth 2 (two) times daily before a meal. 360 tablet 2  . hydrochlorothiazide (HYDRODIURIL) 25 MG tablet Take 0.5 tablets (12.5 mg total) by mouth daily. 45 tablet 3    . Insulin Detemir (LEVEMIR) 100 UNIT/ML Pen Inject 30 units in the morning and  30 units at night. 15 mL 11  . Insulin Pen Needle (UNIFINE PENTIPS) 32G X 4 MM MISC Use as directed for insulin injection daily. 100 each 11  . ketoconazole (NIZORAL) 2 % cream Apply 1 application topically 2 (two) times daily as needed for irritation. 60 g 0  . levalbuterol (XOPENEX) 1.25 MG/3ML nebulizer solution Take 1.25 mg by nebulization daily as needed.    [name] losartan (COZAAR) 100 MG tablet TAKE 1/2 TABLET BY MOUTH ONCE DAILY 90 tablet 1  . Multiple Vitamin (MULTIVITAMIN PO) Take by mouth daily.      [name] Nystatin (NYAMYC) 100000 UNIT/GM POWD APPLY TO AFFECTED AREA TWICE A DAY 60 g 5  . nystatin-triamcinolone (MYCOLOG II) cream Apply topically 2 (two) times daily. 60 g 3  . sitaGLIPtin (JANUVIA) 100 MG tablet TAKE 1 TABLET BY MOUTH DAILY 90 tablet 1  . ondansetron (ZOFRAN ODT) 4 MG disintegrating tablet Take 1 tablet (4 mg total) by mouth every 8 (eight) hours as needed for nausea. (Patient not taking: Reported on 12/20/2015) 10 tablet 0  . traMADol (ULTRAM) 50 MG tablet Take 1 tablet (50 mg total) by mouth every 8 (eight) hours as needed. (Patient not taking: Reported on 12/20/2015) 30 tablet 0   Current Facility-Administered Medications on File Prior to Visit  Medication Dose Route Frequency Provider Last Rate Last Dose  . hepatitis B vac recombinant (RECOMBIVAX) injection 5 mcg  0.5 mL Intramuscular Once Wardell Honour, MD        BP 124/72   Pulse 92   Temp 98 F (36.7 C) (Oral)   Ht 5' 6.75" (1.695 m)   Wt (!) 301 lb 12.8 oz (136.9 kg)   SpO2 97%   BMI 47.62 kg/m    Objective:   Physical Exam  Constitutional: She appears well-nourished.  Cardiovascular: Normal rate and regular rhythm.   Pulmonary/Chest: Effort normal and breath sounds normal.  Skin: Skin is warm and dry.  Mild to moderate erythema to tip of second digit of right hand. Appears infected. No drainage. Tender.            Assessment & Plan:  Finger Infection:  Located to right digit x 1 month. Initially with improvement, now improvement has halted. Given presentation, symptoms, and history of T2DM, will treat with antibiotic course. Rx for Doxycycline BID x 7 days. Home care instructions provided. Follow up if no improvement in 3-4 days.  Sheral Flow, NP

## 2015-12-20 NOTE — Patient Instructions (Signed)
Start Doxycycline antibiotic. Take 1 tablet by mouth twice daily for 7 days.  Please call me if no improvement in 3-4 days.  It was a pleasure to see you today!

## 2015-12-21 ENCOUNTER — Encounter (HOSPITAL_COMMUNITY): Payer: Self-pay

## 2016-01-10 ENCOUNTER — Encounter: Payer: Self-pay | Admitting: Primary Care

## 2016-01-10 ENCOUNTER — Ambulatory Visit (INDEPENDENT_AMBULATORY_CARE_PROVIDER_SITE_OTHER): Payer: BC Managed Care – PPO | Admitting: Primary Care

## 2016-01-10 VITALS — BP 122/80 | HR 90 | Temp 98.3°F | Ht 67.0 in | Wt 296.0 lb

## 2016-01-10 DIAGNOSIS — E119 Type 2 diabetes mellitus without complications: Secondary | ICD-10-CM

## 2016-01-10 DIAGNOSIS — R946 Abnormal results of thyroid function studies: Secondary | ICD-10-CM

## 2016-01-10 DIAGNOSIS — I10 Essential (primary) hypertension: Secondary | ICD-10-CM

## 2016-01-10 DIAGNOSIS — E785 Hyperlipidemia, unspecified: Secondary | ICD-10-CM | POA: Diagnosis not present

## 2016-01-10 DIAGNOSIS — Z23 Encounter for immunization: Secondary | ICD-10-CM | POA: Diagnosis not present

## 2016-01-10 DIAGNOSIS — R7989 Other specified abnormal findings of blood chemistry: Secondary | ICD-10-CM | POA: Insufficient documentation

## 2016-01-10 DIAGNOSIS — F419 Anxiety disorder, unspecified: Secondary | ICD-10-CM

## 2016-01-10 DIAGNOSIS — J4520 Mild intermittent asthma, uncomplicated: Secondary | ICD-10-CM

## 2016-01-10 DIAGNOSIS — Z794 Long term (current) use of insulin: Secondary | ICD-10-CM

## 2016-01-10 DIAGNOSIS — F411 Generalized anxiety disorder: Secondary | ICD-10-CM

## 2016-01-10 LAB — LIPID PANEL
CHOLESTEROL: 199 mg/dL (ref 0–200)
HDL: 41.9 mg/dL (ref >39.00)
Total CHOL/HDL Ratio: 5
Triglycerides: 414 mg/dL — ABNORMAL HIGH (ref 0.0–149.0)

## 2016-01-10 LAB — COMPREHENSIVE METABOLIC PANEL
ALT: 47 U/L — AB (ref 0–35)
AST: 35 U/L (ref 0–37)
Albumin: 3.9 g/dL (ref 3.5–5.2)
Alkaline Phosphatase: 51 U/L (ref 39–117)
BILIRUBIN TOTAL: 0.4 mg/dL (ref 0.2–1.2)
BUN: 12 mg/dL (ref 6–23)
CO2: 28 meq/L (ref 19–32)
CREATININE: 0.86 mg/dL (ref 0.40–1.20)
Calcium: 9.4 mg/dL (ref 8.4–10.5)
Chloride: 101 mEq/L (ref 96–112)
GFR: 73.77 mL/min (ref >60.00)
GLUCOSE: 318 mg/dL — AB (ref 70–99)
Potassium: 4.3 mEq/L (ref 3.5–5.1)
SODIUM: 139 meq/L (ref 135–145)
Total Protein: 6.9 g/dL (ref 6.0–8.3)

## 2016-01-10 LAB — MICROALBUMIN / CREATININE URINE RATIO
Creatinine,U: 74.9 mg/dL
MICROALB UR: 1.5 mg/dL (ref 0.0–1.9)
Microalb Creat Ratio: 2 mg/g (ref 0.0–30.0)

## 2016-01-10 LAB — HEMOGLOBIN A1C: HEMOGLOBIN A1C: 11.6 % — AB (ref 4.6–6.5)

## 2016-01-10 LAB — TSH: TSH: 4.85 u[IU]/mL — AB (ref 0.35–4.50)

## 2016-01-10 LAB — LDL CHOLESTEROL, DIRECT: LDL DIRECT: 90 mg/dL

## 2016-01-10 MED ORDER — TRAZODONE HCL 50 MG PO TABS: 25.0000 mg | ORAL_TABLET | Freq: Every evening | ORAL | 1 refills | Status: DC | PRN

## 2016-01-10 NOTE — Assessment & Plan Note (Signed)
Above goal in March, did not follow up as recommended. Repeat lipids pending. She is taking medication more regularly, strongly encouraged compliance. Will consider adding Zetia if no improvement.

## 2016-01-10 NOTE — Assessment & Plan Note (Signed)
Stable, no wheezing or SOB. Continue current regimen.

## 2016-01-10 NOTE — Assessment & Plan Note (Signed)
Stable in office today, continue current regimen. CMP pending.

## 2016-01-10 NOTE — Progress Notes (Signed)
Pre visit review using our clinic review tool, if applicable. No additional management support is needed unless otherwise documented below in the visit note. 

## 2016-01-10 NOTE — Patient Instructions (Signed)
Complete lab work prior to leaving today. I will notify you of your results once received.   Try Trazodone 50 mg tablets for difficulty sleeping. Take 1/2 to 1 full tablet by mouth at bedtime. Refrain from using Alprazolam if possible.  It is important that you improve your diet. Please limit carbohydrates in the form of white bread, rice, pasta, sweets, fast food, fried food, sugary drinks, etc. Increase your consumption of fresh fruits and vegetables, whole grains, lean protein.  Ensure you are consuming 64 ounces of water daily.  Start exercising. You should be getting 150 minutes of moderate intensity exercise weekly.  It was a pleasure to see you today!

## 2016-01-10 NOTE — Progress Notes (Signed)
Subjective:    Patient ID: [name] [name], female    DOB: 1966-03-19, 50 y.o.   MRN: [id number]  HPI  [name] [name] is a 50 year old female who presents today for follow up.  1) Type 2 Diabetes: A1C of 10 in March 2017. She is currently managed on Levemir 30 units HS and 30 units in the morning, Glipizide-Metformin, and Januvia. These medications were adjusted during her last visit in March given her A1C. She is due for repeat A1C and urine micro albumin due today.  2) Hyperlipidemia: Currently managed on Atorvastatin 40 mg that was switched from 20 mg in March 2017. Lipids above goal in March 2017, especially triglycerides. She was encouraged to start Fish Oil 1000 mg TID. She is taking 1000 mg once daily and Apple Cider Vinegar tablets.  3) Anxiety: Currently managed on Fluoxetine 40 mg and Alprazolam 0.5 mg HS. Overall she's feeling well managed despite the recent increase in stress given her mother's health.She takes Alprazolam HS for sleep and has done so for over 1 year. She's used Melatonin in the past with improvement. She's not tried anything else Rx for insomnia.   4) Abnormal TSH: TSH in October 2016 slightly above goal. She has no history of hypothyroidism. She has noticed sores to her skin, fatigue, and hair thinning. She denies chest pain and palpitations. She is due for repeat TSH today.  5) Abdominal Pain: Located to her right upper quadrant for the past 1 month. Her symptoms are constant with a dull achy most of the time and increased pain on some days. She describes her pain as achy/cramping. She denies nausea/vomiting, fevers. She is able to tolerate PO solids and liquids without difficulty. She had her gall bladder removed years ago.  Review of Systems  Respiratory: Negative for shortness of breath.   Cardiovascular: Negative for chest pain and palpitations.  Gastrointestinal: Positive for abdominal pain. Negative for constipation, diarrhea, nausea and vomiting.  Endocrine:  Negative for cold intolerance and heat intolerance.  Neurological: Negative for numbness.  Psychiatric/Behavioral: The patient is not nervous/anxious.        Uses Xanax HS for insomnia       Past Medical History:  Diagnosis Date  . Allergy   . Anemia   . Anxiety   . Arthritis   . Asthma   . Colon polyp   . Diabetes mellitus   . Edema   . Fatigue   . Hypertension   . IBS (irritable bowel syndrome)   . Lump in female breast   . Methicillin resistant Staphylococcus aureus in conditions classified elsewhere and of unspecified site   . Migraines   . Morbid obesity ([place])   . Night sweats   . Nonspecific abnormal results of thyroid function study   . Other B-complex deficiencies   . Paresthesias   . Pure hypercholesterolemia   . Reflux   . Sinus problem   . Symptomatic states associated with artificial menopause   . Thyroid disease   . Type II or unspecified type diabetes mellitus without mention of complication, not stated as uncontrolled   . Unspecified sleep apnea      Social History   Social History  . Marital status: Married    Spouse name: N/A  . Number of children: 3  . 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 Smoking status: Never Smoker  . Smokeless tobacco: Not on  file  . Alcohol use 0.0 oz/week    0 drink(s) per week     Comment: rarely - once per week at most  . Drug use: No  . Sexual activity: No   Other Topics Concern  . Not on file   Social History Narrative   Exercise: walking two days per week, 30 minutes.       Married x 29 years, happily married; no abuse.       Children: 3 children; 1 grandchild; two step grandchildren; 1 gg.      Lives: with husband, mother-in-law.      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      Tobacco: never      Alcohol: socially.  One glass of wine per month.      Exercise:  Walking around campus.        Seatbelt: 100%      Guns:  gun safe in garage.    Past Surgical History:  Procedure Laterality Date  . abcess removal  1997   MRSA  . ABDOMINAL HYSTERECTOMY  1997   complete in 1997, partial was in 1995  . carpal tunnel release r  07/03/13   [facility]  . [place]  . CHOLECYSTECTOMY  03/1989  . COLONOSCOPY  03/21/2011   Normal.  [facility].  [name] KNEE SURGERY  2006  . LAPAROSCOPIC GASTRIC BANDING  05/2009  . TONSILLECTOMY AND ADENOIDECTOMY  1974  . TUBAL LIGATION      Family History  Problem Relation Age of Onset  . Diabetes Mother   . Heart disease Mother 69    CHF, CAD  . Other Mother     muscle disease  . Hypertension Mother   . Hyperlipidemia Mother   . Arthritis Mother     OA hip L s/p THR  . Diabetes Father   . Heart disease Father     AMI/CABG/valve replacement age 106.  . Stroke Father   . Diabetes Brother   . Heart disease Brother     stent age 22.  . Breast cancer      2 Aunts  . Fibromyalgia Daughter   . Crohn's disease Daughter   . Hypertension Son   . Alzheimer's disease Maternal Grandmother   . Emphysema Maternal Grandfather   . Heart disease Paternal Grandmother     Allergies  Allergen Reactions  . Amoxicillin Shortness Of Breath and Rash  . Levofloxacin Shortness Of Breath and Rash  . Meloxicam Hives and Rash  . Vancomycin Anaphylaxis  . Sulfa Antibiotics Rash  . Ace Inhibitors Cough  . Codeine   . Meperidine Hcl   . Oxycodone-Acetaminophen   . Oxycodone-Acetaminophen   . Penicillins Swelling and Rash  . Tequin [Gatifloxacin] Rash    Current Outpatient Prescriptions on File Prior to Visit  Medication Sig Dispense Refill  . albuterol (PROVENTIL HFA;VENTOLIN HFA) 108 (90 BASE) MCG/ACT inhaler Inhale 2 puffs into the lungs every 6 (six) hours as needed for wheezing. 1 Inhaler 1  . ALPRAZolam (XANAX) 0.5 MG tablet Take 1 tablet (0.5 mg total) by mouth at bedtime as needed for anxiety or sleep. 30 tablet 0  . atorvastatin (LIPITOR) 40 MG tablet Take  1 tablet (40 mg total) by mouth daily. 90 tablet 3  . azelastine (ASTELIN) 0.1 % nasal spray Place 2 sprays into both nostrils 2 (two) times daily. Use in each nostril as directed 30 mL 11  . beclomethasone (QVAR)  80 MCG/ACT inhaler Inhale 2 puffs into the lungs every 12 (twelve) hours as needed. 1 Inhaler 11  . CALCIUM PO Take by mouth 3 (three) times daily.      . Cetirizine HCl (ZYRTEC PO) Take by mouth daily.      . cyclobenzaprine (FLEXERIL) 5 MG tablet One at bedtime for shoulder pain 30 tablet 1  . doxycycline (VIBRA-TABS) 100 MG tablet Take 1 tablet (100 mg total) by mouth 2 (two) times daily. 14 tablet 0  . Fexofenadine HCl (ALLEGRA PO) Take by mouth.      [name] FLUoxetine (PROZAC) 20 MG tablet Take 2 tablets (40 mg total) by mouth daily. 180 tablet 3  . fluticasone (FLONASE) 50 MCG/ACT nasal spray Place 2 sprays into both nostrils daily. 16 g 11  . gabapentin (NEURONTIN) 300 MG capsule One pill tid 270 capsule 1  . glipiZIDE-metformin (METAGLIP) 2.5-500 MG tablet Take 2 tablets by mouth 2 (two) times daily before a meal. 360 tablet 2  . hydrochlorothiazide (HYDRODIURIL) 25 MG tablet Take 0.5 tablets (12.5 mg total) by mouth daily. 45 tablet 3  . Insulin Detemir (LEVEMIR) 100 UNIT/ML Pen Inject 30 units in the morning and 30 units at night. 15 mL 11  . Insulin Pen Needle (UNIFINE PENTIPS) 32G X 4 MM MISC Use as directed for insulin injection daily. 100 each 11  . ketoconazole (NIZORAL) 2 % cream Apply 1 application topically 2 (two) times daily as needed for irritation. 60 g 0  . levalbuterol (XOPENEX) 1.25 MG/3ML nebulizer solution Take 1.25 mg by nebulization daily as needed.    [name] losartan (COZAAR) 100 MG tablet TAKE 1/2 TABLET BY MOUTH ONCE DAILY 90 tablet 1  . Multiple Vitamin (MULTIVITAMIN PO) Take by mouth daily.      [name] Nystatin (NYAMYC) 100000 UNIT/GM POWD APPLY TO AFFECTED AREA TWICE A DAY 60 g 5  . nystatin-triamcinolone (MYCOLOG II) cream Apply topically 2 (two) times daily. 60 g 3   . ondansetron (ZOFRAN ODT) 4 MG disintegrating tablet Take 1 tablet (4 mg total) by mouth every 8 (eight) hours as needed for nausea. (Patient not taking: Reported on 12/20/2015) 10 tablet 0  . sitaGLIPtin (JANUVIA) 100 MG tablet TAKE 1 TABLET BY MOUTH DAILY 90 tablet 1  . traMADol (ULTRAM) 50 MG tablet Take 1 tablet (50 mg total) by mouth every 8 (eight) hours as needed. (Patient not taking: Reported on 12/20/2015) 30 tablet 0   Current Facility-Administered Medications on File Prior to Visit  Medication Dose Route Frequency Provider Last Rate Last Dose  . hepatitis B vac recombinant (RECOMBIVAX) injection 5 mcg  0.5 mL Intramuscular Once Wardell Honour, MD        BP 122/80   Pulse 90   Temp 98.3 F (36.8 C) (Oral)   Ht 5\' 7"  (1.702 m)   Wt 296 lb (134.3 kg)   SpO2 98%   BMI 46.36 kg/m    Objective:   Physical Exam  Constitutional: She appears well-nourished. She does not appear ill.  Neck: Neck supple. No thyromegaly present.  Cardiovascular: Normal rate and regular rhythm.   Pulmonary/Chest: Effort normal and breath sounds normal.  Abdominal: Soft. Bowel sounds are normal. There is no hepatomegaly. There is no tenderness. There is no tenderness at McBurney's point.  Skin: Skin is warm and dry.  Psychiatric: She has a normal mood and affect.          Assessment & Plan:  Abdominal Pain:  Located to RUQ x 1  month.  History of cholecystectomy. Exam unremarkable. CMP pending. Does not appear acutely ill. Most likely fatty liver, will await labs. Exam reassuring.  Sheral Flow, NP

## 2016-01-10 NOTE — Assessment & Plan Note (Signed)
Well managed.  Discouraged use of Xanax, will have her try Trazodone instead. She will update if no improvement.

## 2016-01-10 NOTE — Assessment & Plan Note (Addendum)
A1C of 10 in March 2017, did not follow up as recommended. Continue Levemir 30 units BID, Glipizide-Metformin, and Januvia for now. Will increase Levemir if A1C above goal. Repeat A1C and urine microalbumin pending. Foot exam unremarkable. Managed on statin and ARB.

## 2016-01-10 NOTE — Assessment & Plan Note (Signed)
Abnormal TSH in March 2017. Recheck today, seems mostly asymptomatic.

## 2016-01-14 ENCOUNTER — Other Ambulatory Visit: Payer: Self-pay | Admitting: Primary Care

## 2016-01-14 ENCOUNTER — Encounter: Payer: Self-pay | Admitting: Primary Care

## 2016-01-14 DIAGNOSIS — E119 Type 2 diabetes mellitus without complications: Secondary | ICD-10-CM

## 2016-01-14 DIAGNOSIS — Z794 Long term (current) use of insulin: Principal | ICD-10-CM

## 2016-01-14 MED ORDER — INSULIN DETEMIR 100 UNIT/ML FLEXPEN: PEN_INJECTOR | SUBCUTANEOUS | 11 refills | Status: DC

## 2016-01-24 ENCOUNTER — Telehealth: Payer: Self-pay | Admitting: Primary Care

## 2016-01-24 NOTE — Telephone Encounter (Signed)
-----   Message from Pleas Koch, NP sent at 01/10/2016  8:22 AM EDT ----- Regarding: Insomnia How's she doing since we stopped Alprazolam and started Trazodone for sleep?

## 2016-01-24 NOTE — Telephone Encounter (Signed)
Message left for patient to return my call.  

## 2016-01-24 NOTE — Telephone Encounter (Signed)
-----   Message from Pleas Koch, NP sent at 01/14/2016  1:55 PM EDT ----- Regarding: Diabetes Please check on patient's blood sugars. 1. What are they running first thing in the morning before breakfast? 2. What are they running 2 hours after a meal at bedtime?

## 2016-01-25 ENCOUNTER — Encounter: Payer: Self-pay | Admitting: Primary Care

## 2016-01-26 ENCOUNTER — Other Ambulatory Visit: Payer: Self-pay | Admitting: Primary Care

## 2016-01-26 DIAGNOSIS — E119 Type 2 diabetes mellitus without complications: Secondary | ICD-10-CM

## 2016-01-26 DIAGNOSIS — Z794 Long term (current) use of insulin: Principal | ICD-10-CM

## 2016-01-26 MED ORDER — INSULIN DETEMIR 100 UNIT/ML FLEXPEN: PEN_INJECTOR | SUBCUTANEOUS | 5 refills | Status: DC

## 2016-01-28 NOTE — Telephone Encounter (Signed)
Noted  

## 2016-01-28 NOTE — Telephone Encounter (Signed)
Patient stopped taking the Trazodone in the middle of the last week.  After she takes it, she gets headaches when she wakes up in the mornings.

## 2016-01-28 NOTE — Telephone Encounter (Signed)
Patent stated that to be honest she have not check her sugar on a regular basis.   However, patient stated that she is feeling better and does not urinating at night as much.

## 2016-02-23 ENCOUNTER — Encounter: Payer: Self-pay | Admitting: Primary Care

## 2016-02-24 ENCOUNTER — Other Ambulatory Visit: Payer: Self-pay | Admitting: Primary Care

## 2016-02-24 DIAGNOSIS — R110 Nausea: Secondary | ICD-10-CM

## 2016-02-24 MED ORDER — ONDANSETRON 4 MG PO TBDP: 4.0000 mg | ORAL_TABLET | Freq: Three times a day (TID) | ORAL | 1 refills | Status: DC | PRN

## 2016-05-24 ENCOUNTER — Other Ambulatory Visit: Payer: Self-pay | Admitting: Family Medicine

## 2016-05-24 DIAGNOSIS — E7800 Pure hypercholesterolemia, unspecified: Secondary | ICD-10-CM

## 2016-06-09 ENCOUNTER — Other Ambulatory Visit: Payer: Self-pay | Admitting: Primary Care

## 2016-06-09 DIAGNOSIS — E1140 Type 2 diabetes mellitus with diabetic neuropathy, unspecified: Secondary | ICD-10-CM

## 2016-06-09 DIAGNOSIS — Z794 Long term (current) use of insulin: Principal | ICD-10-CM

## 2016-06-27 ENCOUNTER — Ambulatory Visit (INDEPENDENT_AMBULATORY_CARE_PROVIDER_SITE_OTHER): Payer: BC Managed Care – PPO | Admitting: Family Medicine

## 2016-06-27 ENCOUNTER — Other Ambulatory Visit: Payer: Self-pay | Admitting: Primary Care

## 2016-06-27 ENCOUNTER — Encounter: Payer: Self-pay | Admitting: Family Medicine

## 2016-06-27 DIAGNOSIS — L089 Local infection of the skin and subcutaneous tissue, unspecified: Secondary | ICD-10-CM

## 2016-06-27 MED ORDER — HYDROCODONE-HOMATROPINE 5-1.5 MG/5ML PO SYRP: 5.0000 mL | ORAL_SOLUTION | Freq: Three times a day (TID) | ORAL | 0 refills | Status: DC | PRN

## 2016-06-27 MED ORDER — DOXYCYCLINE HYCLATE 100 MG PO TABS: 100.0000 mg | ORAL_TABLET | Freq: Two times a day (BID) | ORAL | 0 refills | Status: DC

## 2016-06-27 NOTE — Progress Notes (Signed)
SUBJECTIVE:  [name] is a 51 y.o. female who complains of coryza, congestion, sneezing and dry cough for 114 days. She denies a history of anorexia and chest pain and admits to a history of asthma. Patient denies smoke cigarettes.   Current Outpatient Prescriptions on File Prior to Visit  Medication Sig Dispense Refill  . albuterol (PROVENTIL HFA;VENTOLIN HFA) 108 (90 BASE) MCG/ACT inhaler Inhale 2 puffs into the lungs every 6 (six) hours as needed for wheezing. 1 Inhaler 1  . ALPRAZolam (XANAX) 0.5 MG tablet Take 1 tablet (0.5 mg total) by mouth at bedtime as needed for anxiety or sleep. 30 tablet 0  . atorvastatin (LIPITOR) 40 MG tablet Take 1 tablet (40 mg total) by mouth daily. 90 tablet 3  . azelastine (ASTELIN) 0.1 % nasal spray Place 2 sprays into both nostrils 2 (two) times daily. Use in each nostril as directed 30 mL 11  . beclomethasone (QVAR) 80 MCG/ACT inhaler Inhale 2 puffs into the lungs every 12 (twelve) hours as needed. 1 Inhaler 11  . CALCIUM PO Take by mouth 3 (three) times daily.      . Cetirizine HCl (ZYRTEC PO) Take by mouth daily.      . cyclobenzaprine (FLEXERIL) 5 MG tablet One at bedtime for shoulder pain 30 tablet 1  . doxycycline (VIBRA-TABS) 100 MG tablet Take 1 tablet (100 mg total) by mouth 2 (two) times daily. 14 tablet 0  . Fexofenadine HCl (ALLEGRA PO) Take by mouth.      [name] FLUoxetine (PROZAC) 20 MG tablet Take 2 tablets (40 mg total) by mouth daily. 180 tablet 3  . fluticasone (FLONASE) 50 MCG/ACT nasal spray Place 2 sprays into both nostrils daily. 16 g 11  . gabapentin (NEURONTIN) 300 MG capsule TAKE 1 CAPSULE BY MOUTH THREE TIMES A DAY 270 capsule 0  . glipiZIDE-metformin (METAGLIP) 2.5-500 MG tablet Take 2 tablets by mouth 2 (two) times daily before a meal. 360 tablet 2  . hydrochlorothiazide (HYDRODIURIL) 25 MG tablet Take 0.5 tablets (12.5 mg total) by mouth daily. 45 tablet 3  . Insulin Detemir (LEVEMIR) 100 UNIT/ML Pen Inject 35 units in the  morning and 35 units at night. 30 mL 5  . Insulin Pen Needle (UNIFINE PENTIPS) 32G X 4 MM MISC Use as directed for insulin injection daily. 100 each 11  . ketoconazole (NIZORAL) 2 % cream Apply 1 application topically 2 (two) times daily as needed for irritation. 60 g 0  . levalbuterol (XOPENEX) 1.25 MG/3ML nebulizer solution Take 1.25 mg by nebulization daily as needed.    [name] losartan (COZAAR) 100 MG tablet TAKE 1/2 TABLET BY MOUTH ONCE DAILY 90 tablet 1  . Multiple Vitamin (MULTIVITAMIN PO) Take by mouth daily.      [name] Nystatin (NYAMYC) 100000 UNIT/GM POWD APPLY TO AFFECTED AREA TWICE A DAY 60 g 5  . nystatin-triamcinolone (MYCOLOG II) cream Apply topically 2 (two) times daily. 60 g 3  . ondansetron (ZOFRAN ODT) 4 MG disintegrating tablet Take 1 tablet (4 mg total) by mouth every 8 (eight) hours as needed for nausea. 30 tablet 1  . sitaGLIPtin (JANUVIA) 100 MG tablet TAKE 1 TABLET BY MOUTH DAILY 90 tablet 1  . traMADol (ULTRAM) 50 MG tablet Take 1 tablet (50 mg total) by mouth every 8 (eight) hours as needed. (Patient not taking: Reported on 12/20/2015) 30 tablet 0  . traZODone (DESYREL) 50 MG tablet Take 0.5-1 tablets (25-50 mg total) by mouth at bedtime as needed for sleep. [place]  tablet 1   Current Facility-Administered Medications on File Prior to Visit  Medication Dose Route Frequency Provider Last Rate Last Dose  . hepatitis B vac recombinant (RECOMBIVAX) injection 5 mcg  0.5 mL Intramuscular Once Wardell Honour, MD        Allergies  Allergen Reactions  . Amoxicillin Shortness Of Breath and Rash  . Levofloxacin Shortness Of Breath and Rash  . Meloxicam Hives and Rash  . Vancomycin Anaphylaxis  . Sulfa Antibiotics Rash  . Ace Inhibitors Cough  . Codeine   . Meperidine Hcl   . Oxycodone-Acetaminophen   . Oxycodone-Acetaminophen   . Penicillins Swelling and Rash  . Tequin [Gatifloxacin] Rash    Past Medical History:  Diagnosis Date  . Allergy   . Anemia   . Anxiety   . Arthritis    . Asthma   . Colon polyp   . Diabetes mellitus   . Edema   . Fatigue   . Hypertension   . IBS (irritable bowel syndrome)   . Lump in female breast   . Methicillin resistant Staphylococcus aureus in conditions classified elsewhere and of unspecified site   . Migraines   . Morbid obesity ([place])   . Night sweats   . Nonspecific abnormal results of thyroid function study   . Other B-complex deficiencies   . Paresthesias   . Pure hypercholesterolemia   . Reflux   . Sinus problem   . Symptomatic states associated with artificial menopause   . Thyroid disease   . Type II or unspecified type diabetes mellitus without mention of complication, not stated as uncontrolled   . Unspecified sleep apnea     Past Surgical History:  Procedure Laterality Date  . abcess removal  1997   MRSA  . ABDOMINAL HYSTERECTOMY  1997   complete in 1997, partial was in 1995  . carpal tunnel release r  07/03/13   [facility]orf  . [place]  . CHOLECYSTECTOMY  03/1989  . COLONOSCOPY  03/21/2011   Normal.  [facility].  [name] KNEE SURGERY  2006  . LAPAROSCOPIC GASTRIC BANDING  05/2009  . TONSILLECTOMY AND ADENOIDECTOMY  1974  . TUBAL LIGATION      Family History  Problem Relation Age of Onset  . Diabetes Mother   . Heart disease Mother 54    CHF, CAD  . Other Mother     muscle disease  . Hypertension Mother   . Hyperlipidemia Mother   . Arthritis Mother     OA hip L s/p THR  . Diabetes Father   . Heart disease Father     AMI/CABG/valve replacement age 79.  . Stroke Father   . Diabetes Brother   . Heart disease Brother     stent age 33.  . Breast cancer      2 Aunts  . Fibromyalgia Daughter   . Crohn's disease Daughter   . Hypertension Son   . Alzheimer's disease Maternal Grandmother   . Emphysema Maternal Grandfather   . Heart disease Paternal Grandmother     Social History   Social History  . Marital status: Married    Spouse name: N/A  . Number of children: 3  .  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 Smoking status: Never Smoker  . Smokeless tobacco: Never Used  . Alcohol use 0.0 oz/week     Comment: rarely - once  per week at most  . Drug use: No  . Sexual activity: No   Other Topics Concern  . Not on file   Social History Narrative   Exercise: walking two days per week, 30 minutes.       Married x 29 years, happily married; no abuse.       Children: 3 children; 1 grandchild; two step grandchildren; 1 gg.      Lives: with husband, mother-in-law.      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      Tobacco: never      Alcohol: socially.  One glass of wine per month.      Exercise:  Walking around campus.        Seatbelt: 100%      Guns: gun safe in garage.   The PMH, PSH, Social History, Family History, Medications, and allergies have been reviewed in [facility], and have been updated if relevant.  OBJECTIVE:  BP 128/78   Pulse (!) 105   Temp 98.1 F (36.7 C)   Wt 288 lb (130.6 kg)   SpO2 99%   BMI 45.11 kg/m   She appears well, vital signs are as noted. Ears normal.  Throat and pharynx normal.  Neck supple. No adenopathy in the neck. Nose is congested. Sinuses non tender. Right sided wheezes and scattered rhonchi  ASSESSMENT:  bronchitis  PLAN: Doxycyline twice daily x 7 days, rx for hycodan printed and given to pt for severe cough ( per pt, she can take this). Symptomatic therapy suggested: push fluids, rest and return office visit prn if symptoms persist or worsen.Call or return to clinic prn if these symptoms worsen or fail to improve as anticipated.

## 2016-06-27 NOTE — Telephone Encounter (Signed)
Dr. Deborra Medina prescribed Doxycycline today. Will deny refill.

## 2016-06-27 NOTE — Patient Instructions (Signed)
Great to see you.  Take doxycyline as directed - 1 tablet twice daily x 7 days.  Hycodan as needed for severe cough- this will make you sleepy.

## 2016-06-29 ENCOUNTER — Ambulatory Visit: Payer: Self-pay | Admitting: Primary Care

## 2016-07-05 ENCOUNTER — Other Ambulatory Visit: Payer: Self-pay | Admitting: Primary Care

## 2016-07-05 DIAGNOSIS — Z794 Long term (current) use of insulin: Principal | ICD-10-CM

## 2016-07-05 DIAGNOSIS — E119 Type 2 diabetes mellitus without complications: Secondary | ICD-10-CM

## 2016-08-02 ENCOUNTER — Other Ambulatory Visit: Payer: Self-pay | Admitting: Primary Care

## 2016-08-10 ENCOUNTER — Other Ambulatory Visit: Payer: BC Managed Care – PPO

## 2016-08-11 ENCOUNTER — Other Ambulatory Visit: Payer: Self-pay | Admitting: Primary Care

## 2016-08-11 DIAGNOSIS — E785 Hyperlipidemia, unspecified: Secondary | ICD-10-CM

## 2016-08-11 DIAGNOSIS — E119 Type 2 diabetes mellitus without complications: Secondary | ICD-10-CM

## 2016-08-11 DIAGNOSIS — Z794 Long term (current) use of insulin: Principal | ICD-10-CM

## 2016-08-11 DIAGNOSIS — R7989 Other specified abnormal findings of blood chemistry: Secondary | ICD-10-CM

## 2016-08-16 ENCOUNTER — Encounter: Payer: BC Managed Care – PPO | Admitting: Primary Care

## 2016-08-21 ENCOUNTER — Other Ambulatory Visit (INDEPENDENT_AMBULATORY_CARE_PROVIDER_SITE_OTHER): Payer: BC Managed Care – PPO

## 2016-08-21 ENCOUNTER — Encounter: Payer: Self-pay | Admitting: Primary Care

## 2016-08-21 DIAGNOSIS — R946 Abnormal results of thyroid function studies: Secondary | ICD-10-CM

## 2016-08-21 DIAGNOSIS — E119 Type 2 diabetes mellitus without complications: Secondary | ICD-10-CM

## 2016-08-21 DIAGNOSIS — Z794 Long term (current) use of insulin: Secondary | ICD-10-CM

## 2016-08-21 DIAGNOSIS — E785 Hyperlipidemia, unspecified: Secondary | ICD-10-CM

## 2016-08-21 DIAGNOSIS — R7989 Other specified abnormal findings of blood chemistry: Secondary | ICD-10-CM

## 2016-08-21 LAB — COMPREHENSIVE METABOLIC PANEL
ALBUMIN: 4.2 g/dL (ref 3.5–5.2)
ALK PHOS: 67 U/L (ref 39–117)
ALT: 54 U/L — ABNORMAL HIGH (ref 0–35)
AST: 38 U/L — ABNORMAL HIGH (ref 0–37)
BUN: 13 mg/dL (ref 6–23)
CHLORIDE: 96 meq/L (ref 96–112)
CO2: 28 mEq/L (ref 19–32)
Calcium: 9.7 mg/dL (ref 8.4–10.5)
Creatinine, Ser: 0.97 mg/dL (ref 0.40–1.20)
GFR: 64.05 mL/min (ref >60.00)
Glucose, Bld: 400 mg/dL — ABNORMAL HIGH (ref 70–99)
POTASSIUM: 4 meq/L (ref 3.5–5.1)
SODIUM: 136 meq/L (ref 135–145)
TOTAL PROTEIN: 7.5 g/dL (ref 6.0–8.3)
Total Bilirubin: 0.5 mg/dL (ref 0.2–1.2)

## 2016-08-21 LAB — TSH: TSH: 4.47 u[IU]/mL (ref 0.35–4.50)

## 2016-08-21 LAB — T4, FREE: FREE T4: 0.9 ng/dL (ref 0.60–1.60)

## 2016-08-21 LAB — LIPID PANEL
CHOL/HDL RATIO: 6
Cholesterol: 242 mg/dL — ABNORMAL HIGH (ref 0–200)
HDL: 42.5 mg/dL (ref >39.00)
Triglycerides: 552 mg/dL — ABNORMAL HIGH (ref 0.0–149.0)

## 2016-08-21 LAB — LDL CHOLESTEROL, DIRECT: LDL DIRECT: 106 mg/dL

## 2016-08-21 LAB — HEMOGLOBIN A1C: HEMOGLOBIN A1C: 14.4 % — AB (ref 4.6–6.5)

## 2016-08-22 ENCOUNTER — Ambulatory Visit (INDEPENDENT_AMBULATORY_CARE_PROVIDER_SITE_OTHER): Payer: BC Managed Care – PPO | Admitting: Primary Care

## 2016-08-22 ENCOUNTER — Encounter: Payer: Self-pay | Admitting: Primary Care

## 2016-08-22 DIAGNOSIS — Z1231 Encounter for screening mammogram for malignant neoplasm of breast: Secondary | ICD-10-CM | POA: Diagnosis not present

## 2016-08-22 DIAGNOSIS — Z0000 Encounter for general adult medical examination without abnormal findings: Secondary | ICD-10-CM | POA: Diagnosis not present

## 2016-08-22 DIAGNOSIS — R1031 Right lower quadrant pain: Secondary | ICD-10-CM

## 2016-08-22 DIAGNOSIS — R7989 Other specified abnormal findings of blood chemistry: Secondary | ICD-10-CM

## 2016-08-22 DIAGNOSIS — K219 Gastro-esophageal reflux disease without esophagitis: Secondary | ICD-10-CM

## 2016-08-22 DIAGNOSIS — R946 Abnormal results of thyroid function studies: Secondary | ICD-10-CM | POA: Diagnosis not present

## 2016-08-22 DIAGNOSIS — I10 Essential (primary) hypertension: Secondary | ICD-10-CM | POA: Diagnosis not present

## 2016-08-22 DIAGNOSIS — E782 Mixed hyperlipidemia: Secondary | ICD-10-CM | POA: Diagnosis not present

## 2016-08-22 DIAGNOSIS — Z1239 Encounter for other screening for malignant neoplasm of breast: Secondary | ICD-10-CM

## 2016-08-22 DIAGNOSIS — Z794 Long term (current) use of insulin: Secondary | ICD-10-CM

## 2016-08-22 DIAGNOSIS — E119 Type 2 diabetes mellitus without complications: Secondary | ICD-10-CM | POA: Diagnosis not present

## 2016-08-22 DIAGNOSIS — Z23 Encounter for immunization: Secondary | ICD-10-CM

## 2016-08-22 LAB — CBC WITH DIFFERENTIAL/PLATELET
BASOS PCT: 1.2 % (ref 0.0–3.0)
Basophils Absolute: 0.1 10*3/uL (ref 0.0–0.1)
EOS ABS: 0.2 10*3/uL (ref 0.0–0.7)
Eosinophils Relative: 2.8 % (ref 0.0–5.0)
HEMATOCRIT: 45.1 % (ref 36.0–46.0)
HEMOGLOBIN: 14.9 g/dL (ref 12.0–15.0)
LYMPHS PCT: 45.7 % (ref 12.0–46.0)
Lymphs Abs: 3.2 10*3/uL (ref 0.7–4.0)
MCHC: 33.1 g/dL (ref 30.0–36.0)
MCV: 87.8 fl (ref 78.0–100.0)
MONO ABS: 0.4 10*3/uL (ref 0.1–1.0)
Monocytes Relative: 5.6 % (ref 3.0–12.0)
Neutro Abs: 3.1 10*3/uL (ref 1.4–7.7)
Neutrophils Relative %: 44.7 % (ref 43.0–77.0)
Platelets: 282 10*3/uL (ref 150.0–400.0)
RBC: 5.14 Mil/uL — AB (ref 3.87–5.11)
RDW: 13.8 % (ref 11.5–15.5)
WBC: 7 10*3/uL (ref 4.0–10.5)

## 2016-08-22 LAB — POC URINALSYSI DIPSTICK (AUTOMATED)
Bilirubin, UA: NEGATIVE
Blood, UA: NEGATIVE
Ketones, UA: NEGATIVE
LEUKOCYTES UA: NEGATIVE
NITRITE UA: NEGATIVE
PROTEIN UA: NEGATIVE
Spec Grav, UA: 1.02 (ref 1.010–1.025)
UROBILINOGEN UA: NEGATIVE U/dL — AB
pH, UA: 6 (ref 5.0–8.0)

## 2016-08-22 MED ORDER — INSULIN DETEMIR 100 UNIT/ML FLEXPEN: PEN_INJECTOR | SUBCUTANEOUS | 5 refills | Status: DC

## 2016-08-22 MED ORDER — INSULIN PEN NEEDLE 32G X 4 MM MISC: 11 refills | Status: DC

## 2016-08-22 MED ORDER — INSULIN ASPART 100 UNIT/ML FLEXPEN: 8.0000 [IU] | PEN_INJECTOR | Freq: Three times a day (TID) | SUBCUTANEOUS | 5 refills | Status: DC

## 2016-08-22 NOTE — Assessment & Plan Note (Signed)
Doing better on Zantac. Encouraged changes in diet.

## 2016-08-22 NOTE — Assessment & Plan Note (Signed)
Recent TSH stable. Continue to monitor.  

## 2016-08-22 NOTE — Assessment & Plan Note (Signed)
Discussed the importance of a healthy diet and regular exercise in order for weight loss, and to reduce the risk of other medical problems.  

## 2016-08-22 NOTE — Assessment & Plan Note (Signed)
Present for 1-2 months. Exam today not suggestive for acute appendicitis.  Question diverticulitis vs H.Pylori given history. Start with H.Pylori stool specimen and CBC with diff today. If H.Pylori negative then will treat for diverticulitis vs consider CT abdomen pelvis.  She is stable in the office today.

## 2016-08-22 NOTE — Assessment & Plan Note (Signed)
Uncontrolled, poor diet, not exercising. Recent A1C at 14.4. Continue Levemir 30 units BID. Add in Novolog 8 units TID with meals for sugars above 150.  Managed on ARB and statin. Stressed importance of checking sugars as she is currently not doing so. Close follow up in 1 month with sugar logs.

## 2016-08-22 NOTE — Patient Instructions (Addendum)
Continue Levemir 30 units twice daily.   Start Novolog fast acting insulin. Inject 8 units into the skin three times daily with meals for blood sugars above 150.   Start checking your sugars three times daily: Before breakfast, before lunch, and before dinner. Write down your readings and bring them to your next visit.  Continue glipizide-metformin and Januvia.   Start taking your atorvastatin everyday.   Start Fish Oil 1000 mg three times daily with meals.  Complete lab work prior to leaving today. I will notify you of your results once received.   Start working on improvements in [employer] as discussed.  Start exercising. You should be getting 150 minutes of moderate intensity exercise weekly.  Schedule your colonoscopy.  Follow up in 1 month with blood sugar logs.  It was a pleasure to see you today!

## 2016-08-22 NOTE — Assessment & Plan Note (Signed)
Td UTD, Pneumovax due, provided today. Mammogram due, she will schedule. Colonoscopy due, she will schedule. Discussed the importance of a healthy diet and regular exercise in order for weight loss, and to reduce the risk of other medical problems. Exam overall stable. Labs with uncontrolled diabetes and lipids, additional labs pending.

## 2016-08-22 NOTE — Addendum Note (Signed)
Addended by: Jacqualin Combes on: 08/22/2016 05:19 PM   Modules accepted: Orders

## 2016-08-22 NOTE — Assessment & Plan Note (Signed)
Uncontrolled, non compliant to atorvastatin, cannot tolerate Fish Oil. Will have her re-start atorvastatin with daily compliance. Recheck lipids in 6 weeks.

## 2016-08-22 NOTE — Assessment & Plan Note (Signed)
Stable in the office today, continue current regimen. 

## 2016-08-22 NOTE — Progress Notes (Signed)
Subjective:    Patient ID: [name], female    DOB: [birth year], 51 y.o.   MRN: [id number]  HPI  [name] is a 51 year old female who presents today for follow up and for complete physical.  1) Type 2 Diabetes: Currently managed on glipizide-metformin 07/998 mg BID, Levemir 35 units BID (she's been taking 30 units BID), Januvia 100 mg. Her recent A1C was 14.4 which was an increase from 11.6 in October 2017. She's been traveling over the past 2 months. She's never been on meal coverage insurance.   2) Hyperlipidemia: TC of 242 and Trigs of 552 on recent labs. She is currently managed on atorvastatin 40 mg. She's not taking her atorvastatin daily and is taking once weekly.  3) Abdominal Pain: Symptoms of nausea with dry heaving that has been present for the past one month. She also reports abdominal pain that is located to her RLQ, right lower back pain, vaginal itching, pelvic discomfort. She denies hematuria, vaginal discharge, fevers. History of H. Pylori in the past, these symptoms feel similar. She's been taking OTC Zantac for acid reflux, and using Zofran PRN. She has noticed some mucous in her stools, diarrhea (once weekly).   Immunizations: -Tetanus: Completed in 2011 -Influenza: Completed last season -Pneumonia: Completed in 2007, due.  Diet: She endorses a poor diet. Breakfast: Restaurants, fast food Lunch: Restaurants, fast food Dinner: Restaurants, fast food  Snacks: None Desserts: None Beverages: Coke, ginger-ale, water  Exercise: She is not exercising. Eye exam: Completed 2 years ago, due. Dental exam: Completes semi-annually. Colonoscopy: Completed in 2013, due now, she plans on scheduling. Pap Smear: Hysterectomy  Mammogram: Completed in June 2017, scheduled.    Review of Systems  Constitutional: Negative for fever and unexpected weight change.  HENT: Negative for rhinorrhea.   Respiratory: Negative for cough and shortness of breath.   Cardiovascular:  Negative for chest pain.  Gastrointestinal: Positive for abdominal pain, diarrhea and nausea. Negative for blood in stool and constipation.  Genitourinary: Positive for frequency. Negative for difficulty urinating and menstrual problem.  Musculoskeletal: Negative for arthralgias and myalgias.  Skin: Negative for rash.  Allergic/Immunologic: Negative for environmental allergies.  Neurological: Negative for dizziness, numbness and headaches.  Psychiatric/Behavioral:       Overall feels well managed on current regimen       Past Medical History:  Diagnosis Date  . Allergy   . Anemia   . Anxiety   . Arthritis   . Asthma   . Colon polyp   . Diabetes mellitus   . Edema   . Fatigue   . Hypertension   . IBS (irritable bowel syndrome)   . Lump in female breast   . Methicillin resistant Staphylococcus aureus in conditions classified elsewhere and of unspecified site   . Migraines   . Morbid obesity ([place])   . Night sweats   . Nonspecific abnormal results of thyroid function study   . Other B-complex deficiencies   . Paresthesias   . Pure hypercholesterolemia   . Reflux   . Sinus problem   . Symptomatic states associated with artificial menopause   . Thyroid disease   . Type II or unspecified type diabetes mellitus without mention of complication, not stated as uncontrolled   . Unspecified sleep apnea      Social History   Social History  . Marital status: Married    Spouse name: N/A  . Number of children: 3  . 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 Smoking status: Never Smoker  . Smokeless tobacco: Never Used  . Alcohol use 0.0 oz/week     Comment: rarely - once per week at most  . Drug use: No  . Sexual activity: No   Other Topics Concern  . Not on file   Social History Narrative   Exercise: walking two days per week, 30 minutes.       Married x 29 years, happily married; no abuse.        Children: 3 children; 1 grandchild; two step grandchildren; 1 gg.      Lives: with husband, mother-in-law.      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      Tobacco: never      Alcohol: socially.  One glass of wine per month.      Exercise:  Walking around campus.        Seatbelt: 100%      Guns: gun safe in garage.    Past Surgical History:  Procedure Laterality Date  . abcess removal  1997   MRSA  . ABDOMINAL HYSTERECTOMY  1997   complete in 1997, partial was in 1995  . carpal tunnel release r  07/03/13   [facility]  . [place]  . CHOLECYSTECTOMY  03/1989  . COLONOSCOPY  03/21/2011   Normal.  [facility].  [name] KNEE SURGERY  2006  . LAPAROSCOPIC GASTRIC BANDING  05/2009  . TONSILLECTOMY AND ADENOIDECTOMY  1974  . TUBAL LIGATION      Family History  Problem Relation Age of Onset  . Diabetes Mother   . Heart disease Mother 12       CHF, CAD  . Other Mother        muscle disease  . Hypertension Mother   . Hyperlipidemia Mother   . Arthritis Mother        OA hip L s/p THR  . Diabetes Father   . Heart disease Father        AMI/CABG/valve replacement age 11.  . Stroke Father   . Diabetes Brother   . Heart disease Brother        stent age 77.  . Breast cancer Unknown        2 Aunts  . Fibromyalgia Daughter   . Crohn's disease Daughter   . Hypertension Son   . Alzheimer's disease Maternal Grandmother   . Emphysema Maternal Grandfather   . Heart disease Paternal Grandmother     Allergies  Allergen Reactions  . Amoxicillin Shortness Of Breath and Rash  . Levofloxacin Shortness Of Breath and Rash  . Meloxicam Hives and Rash  . Vancomycin Anaphylaxis  . Sulfa Antibiotics Rash  . Ace Inhibitors Cough  . Codeine   . Meperidine Hcl   . Oxycodone-Acetaminophen   . Oxycodone-Acetaminophen   . Penicillins Swelling and Rash  . Tequin [Gatifloxacin] Rash    Current Outpatient Prescriptions on File Prior to Visit  Medication Sig  Dispense Refill  . albuterol (PROVENTIL HFA;VENTOLIN HFA) 108 (90 BASE) MCG/ACT inhaler Inhale 2 puffs into the lungs every 6 (six) hours as needed for wheezing. 1 Inhaler 1  . atorvastatin (LIPITOR) 40 MG tablet Take 1 tablet (40 mg total) by mouth daily. 90 tablet 3  . azelastine (ASTELIN) 0.1 % nasal spray Place 2 sprays into both nostrils 2 (two) times daily. Use in each nostril  as directed 30 mL 11  . beclomethasone (QVAR) 80 MCG/ACT inhaler Inhale 2 puffs into the lungs every 12 (twelve) hours as needed. 1 Inhaler 11  . CALCIUM PO Take by mouth 3 (three) times daily.      . Cetirizine HCl (ZYRTEC PO) Take by mouth daily.      . cyclobenzaprine (FLEXERIL) 5 MG tablet One at bedtime for shoulder pain 30 tablet 1  . Fexofenadine HCl (ALLEGRA PO) Take by mouth.      [name] FLUoxetine (PROZAC) 20 MG tablet Take 2 tablets (40 mg total) by mouth daily. 180 tablet 3  . fluticasone (FLONASE) 50 MCG/ACT nasal spray Place 2 sprays into both nostrils daily. 16 g 11  . gabapentin (NEURONTIN) 300 MG capsule TAKE 1 CAPSULE BY MOUTH THREE TIMES A DAY 270 capsule 0  . glipiZIDE-metformin (METAGLIP) 2.5-500 MG tablet TAKE TWO (2) TABLETS BY MOUTH 2 TIMES DAILY BEFORE A MEAL. 360 tablet 1  . hydrochlorothiazide (HYDRODIURIL) 25 MG tablet Take 0.5 tablets (12.5 mg total) by mouth daily. 45 tablet 3  . ketoconazole (NIZORAL) 2 % cream Apply 1 application topically 2 (two) times daily as needed for irritation. 60 g 0  . levalbuterol (XOPENEX) 1.25 MG/3ML nebulizer solution Take 1.25 mg by nebulization daily as needed.    [name] losartan (COZAAR) 100 MG tablet TAKE 1/2 TABLET BY MOUTH ONCE DAILY 90 tablet 1  . Multiple Vitamin (MULTIVITAMIN PO) Take by mouth daily.      [name] Nystatin (NYAMYC) 100000 UNIT/GM POWD APPLY TO AFFECTED AREA TWICE A DAY 60 g 5  . nystatin-triamcinolone (MYCOLOG II) cream Apply topically 2 (two) times daily. 60 g 3  . ondansetron (ZOFRAN ODT) 4 MG disintegrating tablet Take 1 tablet (4 mg total) by  mouth every 8 (eight) hours as needed for nausea. 30 tablet 1  . sitaGLIPtin (JANUVIA) 100 MG tablet TAKE 1 TABLET BY MOUTH DAILY 90 tablet 1   Current Facility-Administered Medications on File Prior to Visit  Medication Dose Route Frequency Provider Last Rate Last Dose  . hepatitis B vac recombinant (RECOMBIVAX) injection 5 mcg  0.5 mL Intramuscular Once Wardell Honour, MD        BP 116/70   Pulse 99   Temp 98.5 F (36.9 C) (Oral)   Ht 5\' 7"  (1.702 m)   Wt 281 lb (127.5 kg)   SpO2 98%   BMI 44.01 kg/m    Objective:   Physical Exam  Constitutional: She is oriented to person, place, and time. She appears well-nourished. She does not appear ill.  HENT:  Right Ear: Tympanic membrane and ear canal normal.  Left Ear: Tympanic membrane and ear canal normal.  Nose: Nose normal.  Mouth/Throat: Oropharynx is clear and moist.  Eyes: Conjunctivae and EOM are normal. Pupils are equal, round, and reactive to light.  Neck: Neck supple. No thyromegaly present.  Cardiovascular: Normal rate and regular rhythm.   No murmur heard. Pulmonary/Chest: Effort normal and breath sounds normal. She has no rales.  Abdominal: Soft. Bowel sounds are normal. There is no tenderness. There is no rebound, no guarding and no CVA tenderness.    Tenderness to right suprapubic region.  Musculoskeletal: Normal range of motion.  Lymphadenopathy:    She has no cervical adenopathy.  Neurological: She is alert and oriented to person, place, and time. She has normal reflexes. No cranial nerve deficit.  Skin: Skin is warm and dry. No rash noted.  Psychiatric: She has a normal mood and affect.  Assessment & Plan:

## 2016-08-24 NOTE — Addendum Note (Signed)
Addended bySelinda Orion on: 08/24/2016 03:23 PM   Modules accepted: Orders

## 2016-09-01 ENCOUNTER — Encounter: Payer: Self-pay | Admitting: Primary Care

## 2016-09-01 DIAGNOSIS — R110 Nausea: Secondary | ICD-10-CM

## 2016-09-01 MED ORDER — ONDANSETRON 4 MG PO TBDP: 4.0000 mg | ORAL_TABLET | Freq: Three times a day (TID) | ORAL | 0 refills | Status: DC | PRN

## 2016-09-06 ENCOUNTER — Other Ambulatory Visit: Payer: Self-pay | Admitting: Family Medicine

## 2016-09-06 DIAGNOSIS — E1140 Type 2 diabetes mellitus with diabetic neuropathy, unspecified: Secondary | ICD-10-CM

## 2016-09-06 DIAGNOSIS — Z794 Long term (current) use of insulin: Principal | ICD-10-CM

## 2016-09-06 NOTE — Telephone Encounter (Signed)
Last refill 06/09/16  Last OV 08/22/16 Ok to refill?

## 2016-09-25 ENCOUNTER — Ambulatory Visit (INDEPENDENT_AMBULATORY_CARE_PROVIDER_SITE_OTHER): Payer: BC Managed Care – PPO | Admitting: Primary Care

## 2016-09-25 ENCOUNTER — Encounter: Payer: Self-pay | Admitting: Primary Care

## 2016-09-25 VITALS — BP 122/78 | HR 98 | Temp 98.2°F | Wt 286.8 lb

## 2016-09-25 DIAGNOSIS — E119 Type 2 diabetes mellitus without complications: Secondary | ICD-10-CM

## 2016-09-25 DIAGNOSIS — Z794 Long term (current) use of insulin: Secondary | ICD-10-CM

## 2016-09-25 DIAGNOSIS — J302 Other seasonal allergic rhinitis: Secondary | ICD-10-CM | POA: Diagnosis not present

## 2016-09-25 DIAGNOSIS — E785 Hyperlipidemia, unspecified: Secondary | ICD-10-CM | POA: Diagnosis not present

## 2016-09-25 LAB — LIPID PANEL
Cholesterol: 243 mg/dL — ABNORMAL HIGH (ref 0–200)
HDL: 44.5 mg/dL (ref >39.00)
Total CHOL/HDL Ratio: 5
Triglycerides: 412 mg/dL — ABNORMAL HIGH (ref 0.0–149.0)

## 2016-09-25 LAB — LDL CHOLESTEROL, DIRECT: Direct LDL: 137 mg/dL

## 2016-09-25 MED ORDER — LEVOCETIRIZINE DIHYDROCHLORIDE 5 MG PO TABS: 5.0000 mg | ORAL_TABLET | Freq: Every evening | ORAL | 0 refills | Status: DC

## 2016-09-25 NOTE — Assessment & Plan Note (Signed)
Compliant to atorvastatin, check lipids today.

## 2016-09-25 NOTE — Patient Instructions (Addendum)
Complete lab work prior to leaving today. I will notify you of your results once received.   Start checking your blood sugars first thing in the morning when you wake up and before every meal.   Continue Levemir and Novolog as prescribed.   Switch from Zyrtec to Xyzal (levocetirizine) at bedtime for allergies.  You may use the albuterol inhaler prior to bedtime for cough for the next 1-2 weeks.   Consider trying omeprazole rather than ranitidine for acid reflux at night.  Follow up in 1 month with your sugar logs.  It was a pleasure to see you today!

## 2016-09-25 NOTE — Assessment & Plan Note (Signed)
Has not checked blood glucose levels since last visit. Long discussion today regarding importance of blood glucose monitoring while on insulin, she verbalized understanding.  Continue Levemir and Novolog as prescribed for now given that we have no readings to dose her insulin.   Follow up in 1 month with glucose logs.

## 2016-09-25 NOTE — Progress Notes (Signed)
Subjective:    Patient ID: [name], female    DOB: 1965/06/29, 51 y.o.   MRN: [id number]  HPI  [name] is a 51 year old female who presents today for follow up.  1) Hyperlipidemia: Restarted atorvastatin in early June 2018. She is due for repeat lipids today. She is fasting today and has been compliant to her atorvastatin.  2) Type 2 Diabetes: A1C of 14.4 in June 2018, non compliant to diet and medication during that visit. She is currently managed on Levemir 30 units BID and Novolog 8 units TID. She's not had her Levemir in 4 days as there's a problem with the supply at the pharmacy. She's not checked her blood sugars since her last visit.   She's increased her Novolog to 15 units TID over the past four days since running out of her Levemir. She's noticed improvements in symptoms including less vaginal discharge/itching, nocturia, neuropathy.   3) Cough: Present for the past 2 weeks since on vacation at the beach. The cough is worse at night when laying down. She's been taking ranitidine 150 mg once daily for the past 2 weeks, and is also compliant to Allegra and Zyrtec without much improvement. She denies shortness of breath, lower extremity edema, productive cough, fevers, chills. She does have a history of allergic rhinitis and asthma. She's not using her albuterol inhaler.  Review of Systems  Constitutional: Negative for fatigue and fever.  Respiratory: Positive for cough. Negative for shortness of breath.   Cardiovascular: Negative for chest pain.  Endocrine: Negative for polyuria.  Genitourinary: Negative for frequency and vaginal discharge.  Neurological: Negative for dizziness and weakness.       Past Medical History:  Diagnosis Date  . Allergy   . Anemia   . Anxiety   . Arthritis   . Asthma   . Colon polyp   . Diabetes mellitus   . Edema   . Fatigue   . Hypertension   . IBS (irritable bowel syndrome)   . Lump in female breast   . Methicillin resistant  Staphylococcus aureus in conditions classified elsewhere and of unspecified site   . Migraines   . Morbid obesity ([place])   . Night sweats   . Nonspecific abnormal results of thyroid function study   . Other B-complex deficiencies   . Paresthesias   . Pure hypercholesterolemia   . Reflux   . Sinus problem   . Symptomatic states associated with artificial menopause   . Thyroid disease   . Type II or unspecified type diabetes mellitus without mention of complication, not stated as uncontrolled   . Unspecified sleep apnea      Social History   Social History  . Marital status: Married    Spouse name: N/A  . Number of children: 3  . 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 Smoking status: Never Smoker  . Smokeless tobacco: Never Used  . Alcohol use 0.0 oz/week     Comment: rarely - once per week at most  . Drug use: No  . Sexual activity: No   Other Topics Concern  . Not on file   Social History Narrative   Exercise: walking two days per week, 30 minutes.       Married x 29 years, happily married; no abuse.       Children: 3 children; 1 grandchild; two step grandchildren; 1 gg.  Lives: with husband, mother-in-law.      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      Tobacco: never      Alcohol: socially.  One glass of wine per month.      Exercise:  Walking around campus.        Seatbelt: 100%      Guns: gun safe in garage.    Past Surgical History:  Procedure Laterality Date  . abcess removal  1997   MRSA  . ABDOMINAL HYSTERECTOMY  1997   complete in 1997, partial was in 1995  . carpal tunnel release r  07/03/13   [facility]orf  . [place]  . CHOLECYSTECTOMY  03/1989  . COLONOSCOPY  03/21/2011   Normal.  [facility].  [name] KNEE SURGERY  2006  . LAPAROSCOPIC GASTRIC BANDING  05/2009  . TONSILLECTOMY AND ADENOIDECTOMY  1974  . TUBAL LIGATION      Family History    Problem Relation Age of Onset  . Diabetes Mother   . Heart disease Mother 54       CHF, CAD  . Other Mother        muscle disease  . Hypertension Mother   . Hyperlipidemia Mother   . Arthritis Mother        OA hip L s/p THR  . Diabetes Father   . Heart disease Father        AMI/CABG/valve replacement age 3.  . Stroke Father   . Diabetes Brother   . Heart disease Brother        stent age 27.  . Breast cancer Unknown        2 Aunts  . Fibromyalgia Daughter   . Crohn's disease Daughter   . Hypertension Son   . Alzheimer's disease Maternal Grandmother   . Emphysema Maternal Grandfather   . Heart disease Paternal Grandmother     Allergies  Allergen Reactions  . Amoxicillin Shortness Of Breath and Rash  . Levofloxacin Shortness Of Breath and Rash  . Meloxicam Hives and Rash  . Vancomycin Anaphylaxis  . Sulfa Antibiotics Rash  . Ace Inhibitors Cough  . Codeine   . Meperidine Hcl   . Oxycodone-Acetaminophen   . Oxycodone-Acetaminophen   . Penicillins Swelling and Rash  . Tequin [Gatifloxacin] Rash    Current Outpatient Prescriptions on File Prior to Visit  Medication Sig Dispense Refill  . albuterol (PROVENTIL HFA;VENTOLIN HFA) 108 (90 BASE) MCG/ACT inhaler Inhale 2 puffs into the lungs every 6 (six) hours as needed for wheezing. 1 Inhaler 1  . atorvastatin (LIPITOR) 40 MG tablet Take 1 tablet (40 mg total) by mouth daily. 90 tablet 3  . azelastine (ASTELIN) 0.1 % nasal spray Place 2 sprays into both nostrils 2 (two) times daily. Use in each nostril as directed 30 mL 11  . beclomethasone (QVAR) 80 MCG/ACT inhaler Inhale 2 puffs into the lungs every 12 (twelve) hours as needed. 1 Inhaler 11  . CALCIUM PO Take by mouth 3 (three) times daily.      . Cetirizine HCl (ZYRTEC PO) Take by mouth daily.      . cyclobenzaprine (FLEXERIL) 5 MG tablet One at bedtime for shoulder pain 30 tablet 1  . Fexofenadine HCl (ALLEGRA PO) Take by mouth.      [name] FLUoxetine (PROZAC) 20 MG tablet  Take 2 tablets (40 mg total) by mouth daily. 180 tablet 3  . fluticasone (FLONASE) 50 MCG/ACT nasal spray  Place 2 sprays into both nostrils daily. 16 g 11  . gabapentin (NEURONTIN) 300 MG capsule TAKE 1 CAPSULE BY MOUTH THREE TIMES A DAY 270 capsule 3  . glipiZIDE-metformin (METAGLIP) 2.5-500 MG tablet TAKE TWO (2) TABLETS BY MOUTH 2 TIMES DAILY BEFORE A MEAL. 360 tablet 1  . hydrochlorothiazide (HYDRODIURIL) 25 MG tablet Take 0.5 tablets (12.5 mg total) by mouth daily. 45 tablet 3  . insulin aspart (NOVOLOG FLEXPEN) 100 UNIT/ML FlexPen Inject 8 Units into the skin 3 (three) times daily with meals. 15 mL 5  . Insulin Pen Needle (UNIFINE PENTIPS) 32G X 4 MM MISC Use as directed for insulin injection daily. 100 each 11  . ketoconazole (NIZORAL) 2 % cream Apply 1 application topically 2 (two) times daily as needed for irritation. 60 g 0  . levalbuterol (XOPENEX) 1.25 MG/3ML nebulizer solution Take 1.25 mg by nebulization daily as needed.    [name] losartan (COZAAR) 100 MG tablet TAKE 1/2 TABLET BY MOUTH ONCE DAILY 90 tablet 1  . Multiple Vitamin (MULTIVITAMIN PO) Take by mouth daily.      [name] Nystatin (NYAMYC) 100000 UNIT/GM POWD APPLY TO AFFECTED AREA TWICE A DAY 60 g 5  . nystatin-triamcinolone (MYCOLOG II) cream Apply topically 2 (two) times daily. 60 g 3  . ondansetron (ZOFRAN ODT) 4 MG disintegrating tablet Take 1 tablet (4 mg total) by mouth every 8 (eight) hours as needed for nausea. 20 tablet 0  . sitaGLIPtin (JANUVIA) 100 MG tablet TAKE 1 TABLET BY MOUTH DAILY 90 tablet 1  . Insulin Detemir (LEVEMIR) 100 UNIT/ML Pen Inject 30 units in the morning and 30 units at night. (Patient not taking: Reported on 09/25/2016) 30 mL 5   Current Facility-Administered Medications on File Prior to Visit  Medication Dose Route Frequency Provider Last Rate Last Dose  . hepatitis B vac recombinant (RECOMBIVAX) injection 5 mcg  0.5 mL Intramuscular Once Wardell Honour, MD        BP 122/78   Pulse 98   Temp 98.2 F  (36.8 C) (Oral)   Wt 286 lb 12.8 oz (130.1 kg)   SpO2 99%   BMI 44.92 kg/m    Objective:   Physical Exam  Constitutional: She appears well-nourished.  Neck: Neck supple.  Cardiovascular: Normal rate and regular rhythm.   Pulmonary/Chest: Effort normal and breath sounds normal. She has no wheezes. She has no rales.  Skin: Skin is warm and dry.          Assessment & Plan:  Cough:  Present for 2 weeks. Exam today without evidence for viral/bacterial involvement. No signs of CHF. Suspect either GERD, allergy, asthma involvement. Provided options for treatment including omeprazole, albuterol, Xyzal. She will update if no improvement.  Sheral Flow, NP

## 2016-09-25 NOTE — Assessment & Plan Note (Signed)
Suspect this to be the cause of her cough. Will have her switch from Zyrtec to Xyzal. Use albuterol PRN. Also consider omeprazole if no improvement.

## 2016-09-26 ENCOUNTER — Encounter: Payer: Self-pay | Admitting: Primary Care

## 2016-09-26 ENCOUNTER — Ambulatory Visit
Admission: RE | Admit: 2016-09-26 | Discharge: 2016-09-26 | Disposition: A | Discharge status: Home / Self Care | Payer: BC Managed Care – PPO | Source: Ambulatory Visit | Attending: Primary Care | Admitting: Primary Care

## 2016-09-26 DIAGNOSIS — Z1239 Encounter for other screening for malignant neoplasm of breast: Secondary | ICD-10-CM

## 2016-09-26 DIAGNOSIS — E781 Pure hyperglyceridemia: Secondary | ICD-10-CM

## 2016-09-26 MED ORDER — FENOFIBRATE 145 MG PO TABS: 145.0000 mg | ORAL_TABLET | Freq: Every day | ORAL | 1 refills | Status: DC

## 2016-10-26 ENCOUNTER — Ambulatory Visit: Payer: BC Managed Care – PPO | Admitting: Primary Care

## 2016-10-27 ENCOUNTER — Other Ambulatory Visit: Payer: Self-pay | Admitting: Primary Care

## 2016-10-27 ENCOUNTER — Ambulatory Visit: Payer: BC Managed Care – PPO | Admitting: Primary Care

## 2016-10-27 DIAGNOSIS — E7800 Pure hypercholesterolemia, unspecified: Secondary | ICD-10-CM

## 2016-10-30 ENCOUNTER — Ambulatory Visit: Payer: BC Managed Care – PPO | Admitting: Primary Care

## 2016-11-10 ENCOUNTER — Other Ambulatory Visit: Payer: Self-pay | Admitting: Primary Care

## 2016-11-10 DIAGNOSIS — E119 Type 2 diabetes mellitus without complications: Secondary | ICD-10-CM

## 2016-11-10 DIAGNOSIS — Z794 Long term (current) use of insulin: Principal | ICD-10-CM

## 2016-11-13 ENCOUNTER — Encounter: Payer: Self-pay | Admitting: Primary Care

## 2016-11-13 ENCOUNTER — Ambulatory Visit (INDEPENDENT_AMBULATORY_CARE_PROVIDER_SITE_OTHER): Payer: BC Managed Care – PPO | Admitting: Primary Care

## 2016-11-13 VITALS — BP 122/76 | HR 101 | Temp 98.3°F | Ht 67.0 in | Wt 284.8 lb

## 2016-11-13 DIAGNOSIS — E119 Type 2 diabetes mellitus without complications: Secondary | ICD-10-CM | POA: Diagnosis not present

## 2016-11-13 DIAGNOSIS — M25551 Pain in right hip: Secondary | ICD-10-CM

## 2016-11-13 DIAGNOSIS — Z794 Long term (current) use of insulin: Secondary | ICD-10-CM

## 2016-11-13 MED ORDER — CYCLOBENZAPRINE HCL 5 MG PO TABS: 5.0000 mg | ORAL_TABLET | Freq: Three times a day (TID) | ORAL | 0 refills | Status: DC | PRN

## 2016-11-13 MED ORDER — INSULIN DETEMIR 100 UNIT/ML FLEXPEN: PEN_INJECTOR | SUBCUTANEOUS | 5 refills | Status: DC

## 2016-11-13 MED ORDER — INSULIN ASPART 100 UNIT/ML FLEXPEN: 15.0000 [IU] | PEN_INJECTOR | Freq: Three times a day (TID) | SUBCUTANEOUS | 5 refills | Status: DC

## 2016-11-13 NOTE — Assessment & Plan Note (Signed)
Due for repeat A1C next week. Commended her on improvement in diet, discussed to start exercising.  Foot exam unremarkable today. Managed on ARB and statin. She will schedule eye exam soon. Follow up dependant on A1C result.  Continue Levemir 35 units BID and Novolog 15 units TID, and other oral meds as prescribed for now.

## 2016-11-13 NOTE — Patient Instructions (Signed)
Schedule a lab appointment after September 4th to recheck your A1C. I will be in touch with you once I receive your results.  Start exercising. You should be getting 150 minutes of moderate intensity exercise weekly.  Continue your efforts towards a healthy diet.  You may take the cyclobenzaprine every 8 hours as needed for hip pain. Please notify me if no improvement in 1 week.  I'll be in touch with you in regards to follow up once we receive your lab results.  It was a pleasure to see you today!  Diabetes Mellitus and Food It is important for you to manage your blood sugar (glucose) level. Your blood glucose level can be greatly affected by what you eat. Eating healthier foods in the appropriate amounts throughout the day at about the same time each day will help you control your blood glucose level. It can also help slow or prevent worsening of your diabetes mellitus. Healthy eating may even help you improve the level of your blood pressure and reach or maintain a healthy weight. General recommendations for healthful eating and cooking habits include:  Eating meals and snacks regularly. Avoid going long periods of time without eating to lose weight.  Eating a diet that consists mainly of plant-based foods, such as fruits, vegetables, nuts, legumes, and whole grains.  Using low-heat cooking methods, such as baking, instead of high-heat cooking methods, such as deep frying.  Work with your dietitian to make sure you understand how to use the Nutrition Facts information on food labels. How can food affect me? Carbohydrates Carbohydrates affect your blood glucose level more than any other type of food. Your dietitian will help you determine how many carbohydrates to eat at each meal and teach you how to count carbohydrates. Counting carbohydrates is important to keep your blood glucose at a healthy level, especially if you are using insulin or taking certain medicines for diabetes  mellitus. Alcohol Alcohol can cause sudden decreases in blood glucose (hypoglycemia), especially if you use insulin or take certain medicines for diabetes mellitus. Hypoglycemia can be a life-threatening condition. Symptoms of hypoglycemia (sleepiness, dizziness, and disorientation) are similar to symptoms of having too much alcohol. If your health care provider has given you approval to drink alcohol, do so in moderation and use the following guidelines:  Women should not have more than one drink per day, and men should not have more than two drinks per day. One drink is equal to: ? 12 oz of beer. ? 5 oz of wine. ? 1 oz of hard liquor.  Do not drink on an empty stomach.  Keep yourself hydrated. Have water, diet soda, or unsweetened iced tea.  Regular soda, juice, and other mixers might contain a lot of carbohydrates and should be counted.  What foods are not recommended? As you make food choices, it is important to remember that all foods are not the same. Some foods have fewer nutrients per serving than other foods, even though they might have the same number of calories or carbohydrates. It is difficult to get your body what it needs when you eat foods with fewer nutrients. Examples of foods that you should avoid that are high in calories and carbohydrates but low in nutrients include:  Trans fats (most processed foods list trans fats on the Nutrition Facts label).  Regular soda.  Juice.  Candy.  Sweets, such as cake, pie, doughnuts, and cookies.  Fried foods.  What foods can I eat? Eat nutrient-rich foods, which will  nourish your body and keep you healthy. The food you should eat also will depend on several factors, including:  The calories you need.  The medicines you take.  Your weight.  Your blood glucose level.  Your blood pressure level.  Your cholesterol level.  You should eat a variety of foods, including:  Protein. ? Lean cuts of meat. ? Proteins low in  saturated fats, such as fish, egg whites, and beans. Avoid processed meats.  Fruits and vegetables. ? Fruits and vegetables that may help control blood glucose levels, such as apples, mangoes, and yams.  Dairy products. ? Choose fat-free or low-fat dairy products, such as milk, yogurt, and cheese.  Grains, bread, pasta, and rice. ? Choose whole grain products, such as multigrain bread, whole oats, and brown rice. These foods may help control blood pressure.  Fats. ? Foods containing healthful fats, such as nuts, avocado, olive oil, canola oil, and fish.  Does everyone with diabetes mellitus have the same meal plan? Because every person with diabetes mellitus is different, there is not one meal plan that works for everyone. It is very important that you meet with a dietitian who will help you create a meal plan that is just right for you. This information is not intended to replace advice given to you by your health care provider. Make sure you discuss any questions you have with your health care provider. Document Released: 12/01/2004 Document Revised: 08/12/2015 Document Reviewed: 01/31/2013 Elsevier Interactive Patient Education  2017 [employer].

## 2016-11-13 NOTE — Progress Notes (Signed)
Subjective:    Patient ID: [name], female    DOB: 1965-11-26, 51 y.o.   MRN: [id number]  HPI  [name] is a 51 year old female who presents today for follow up of diabetes.  Current medications include: glipizide-metformin 2.5-500 mg, Levemir 35 units BID, Novolog 15 units TID with meals, Januvia 100 mg.   She is checking her blood glucose once daily and is getting readings ranging 90-150.   Last A1C: 14.4 in on 08/21/16 Last Eye Exam: Overdue. Plans on scheduling.  Last Foot Exam: Due Pneumonia Vaccination: Completed in 2018 ACE/ARB: Losartan Statin: Atorvastatin   Diet currently consists of:  Breakfast: Honey Oats Cereal with 1/2 cup milk or Yogurt with Granola Lunch: Soup or [employer]: Fish, chicken, occasional steak/pork, vegetables Snacks: None  Desserts: Sugar Free Ice Cream, occasionally other sweets. Beverages: Water, Coke  Exercise: Not currently exercising   2) Hip Pain: Located to right buttocks and hip that has been intermittent for the past several months. She has fallen several times within the last several months (slid with her feet), no recent fall within the last several weeks. She describes her pain as a constant ache. She's been taking tylenol without much improvement. She denies numbness/tingling, weakness, swelling.    Review of Systems  Respiratory: Negative for shortness of breath.   Cardiovascular: Negative for chest pain.  Musculoskeletal:       Right hip pain  Neurological: Negative for weakness and numbness.       Past Medical History:  Diagnosis Date  . Allergy   . Anemia   . Anxiety   . Arthritis   . Asthma   . Colon polyp   . Diabetes mellitus   . Edema   . Fatigue   . Hypertension   . IBS (irritable bowel syndrome)   . Lump in female breast   . Methicillin resistant Staphylococcus aureus in conditions classified elsewhere and of unspecified site   . Migraines   . Morbid obesity ([place])   . Night sweats   .  Nonspecific abnormal results of thyroid function study   . Other B-complex deficiencies   . Paresthesias   . Pure hypercholesterolemia   . Reflux   . Sinus problem   . Symptomatic states associated with artificial menopause   . Thyroid disease   . Type II or unspecified type diabetes mellitus without mention of complication, not stated as uncontrolled   . Unspecified sleep apnea      Social History   Social History  . Marital status: Married    Spouse name: N/A  . Number of children: 3  . 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 Smoking status: Never Smoker  . Smokeless tobacco: Never Used  . Alcohol use 0.0 oz/week     Comment: rarely - once per week at most  . Drug use: No  . Sexual activity: No   Other Topics Concern  . Not on file   Social History Narrative   Exercise: walking two days per week, 30 minutes.       Married x 29 years, happily married; no abuse.       Children: 3 children; 1 grandchild; two step grandchildren; 1 gg.      Lives: with husband, mother-in-law.      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      Tobacco: never  Alcohol: socially.  One glass of wine per month.      Exercise:  Walking around campus.        Seatbelt: 100%      Guns: gun safe in garage.    Past Surgical History:  Procedure Laterality Date  . abcess removal  1997   MRSA  . ABDOMINAL HYSTERECTOMY  1997   complete in 1997, partial was in 1995  . carpal tunnel release r  07/03/13   [facility]  . [place]  . CHOLECYSTECTOMY  03/1989  . COLONOSCOPY  03/21/2011   Normal.  [facility].  [name] KNEE SURGERY  2006  . LAPAROSCOPIC GASTRIC BANDING  05/2009  . TONSILLECTOMY AND ADENOIDECTOMY  1974  . TUBAL LIGATION      Family History  Problem Relation Age of Onset  . Diabetes Mother   . Heart disease Mother 49       CHF, CAD  . Other Mother        muscle disease  .  Hypertension Mother   . Hyperlipidemia Mother   . Arthritis Mother        OA hip L s/p THR  . Diabetes Father   . Heart disease Father        AMI/CABG/valve replacement age 4.  . Stroke Father   . Diabetes Brother   . Heart disease Brother        stent age 25.  . Fibromyalgia Daughter   . Crohn's disease Daughter   . Hypertension Son   . Alzheimer's disease Maternal Grandmother   . Emphysema Maternal Grandfather   . Heart disease Paternal Grandmother   . Breast cancer Unknown        2 Aunts    Allergies  Allergen Reactions  . Amoxicillin Shortness Of Breath and Rash  . Levofloxacin Shortness Of Breath and Rash  . Meloxicam Hives and Rash  . Vancomycin Anaphylaxis  . Sulfa Antibiotics Rash  . Ace Inhibitors Cough  . Codeine   . Meperidine Hcl   . Oxycodone-Acetaminophen   . Oxycodone-Acetaminophen   . Penicillins Swelling and Rash  . Tequin [Gatifloxacin] Rash    Current Outpatient Prescriptions on File Prior to Visit  Medication Sig Dispense Refill  . albuterol (PROVENTIL HFA;VENTOLIN HFA) 108 (90 BASE) MCG/ACT inhaler Inhale 2 puffs into the lungs every 6 (six) hours as needed for wheezing. 1 Inhaler 1  . atorvastatin (LIPITOR) 40 MG tablet TAKE 1 TABLET BY MOUTH DAILY 90 tablet 3  . azelastine (ASTELIN) 0.1 % nasal spray Place 2 sprays into both nostrils 2 (two) times daily. Use in each nostril as directed 30 mL 11  . beclomethasone (QVAR) 80 MCG/ACT inhaler Inhale 2 puffs into the lungs every 12 (twelve) hours as needed. 1 Inhaler 11  . CALCIUM PO Take by mouth 3 (three) times daily.      . Cetirizine HCl (ZYRTEC PO) Take by mouth daily.      . fenofibrate (TRICOR) 145 MG tablet Take 1 tablet (145 mg total) by mouth daily. 90 tablet 1  . Fexofenadine HCl (ALLEGRA PO) Take by mouth.      [name] FLUoxetine (PROZAC) 20 MG tablet Take 2 tablets (40 mg total) by mouth daily. 180 tablet 3  . fluticasone (FLONASE) 50 MCG/ACT nasal spray Place 2 sprays into both nostrils daily.  16 g 11  . gabapentin (NEURONTIN) 300 MG capsule TAKE 1 CAPSULE BY MOUTH THREE TIMES A DAY 270 capsule 3  . glipiZIDE-metformin (  METAGLIP) 2.5-500 MG tablet TAKE TWO (2) TABLETS BY MOUTH 2 TIMES DAILY BEFORE A MEAL. 360 tablet 1  . hydrochlorothiazide (HYDRODIURIL) 25 MG tablet Take 0.5 tablets (12.5 mg total) by mouth daily. 45 tablet 3  . insulin aspart (NOVOLOG FLEXPEN) 100 UNIT/ML FlexPen Inject 8 Units into the skin 3 (three) times daily with meals. 15 mL 5  . Insulin Detemir (LEVEMIR) 100 UNIT/ML Pen Inject 30 units in the morning and 30 units at night. 30 mL 5  . Insulin Pen Needle (UNIFINE PENTIPS) 32G X 4 MM MISC Use as directed for insulin injection daily. 100 each 11  . JANUVIA 100 MG tablet TAKE 1 TABLET BY MOUTH DAILY 90 tablet 1  . ketoconazole (NIZORAL) 2 % cream Apply 1 application topically 2 (two) times daily as needed for irritation. 60 g 0  . levalbuterol (XOPENEX) 1.25 MG/3ML nebulizer solution Take 1.25 mg by nebulization daily as needed.    [name] levocetirizine (XYZAL) 5 MG tablet Take 1 tablet (5 mg total) by mouth every evening. 90 tablet 0  . losartan (COZAAR) 100 MG tablet TAKE 1/2 TABLET BY MOUTH ONCE DAILY 90 tablet 1  . Multiple Vitamin (MULTIVITAMIN PO) Take by mouth daily.      [name] Nystatin (NYAMYC) 100000 UNIT/GM POWD APPLY TO AFFECTED AREA TWICE A DAY 60 g 5  . nystatin-triamcinolone (MYCOLOG II) cream Apply topically 2 (two) times daily. 60 g 3  . ondansetron (ZOFRAN ODT) 4 MG disintegrating tablet Take 1 tablet (4 mg total) by mouth every 8 (eight) hours as needed for nausea. (Patient not taking: Reported on 11/13/2016) 20 tablet 0   Current Facility-Administered Medications on File Prior to Visit  Medication Dose Route Frequency Provider Last Rate Last Dose  . hepatitis B vac recombinant (RECOMBIVAX) injection 5 mcg  0.5 mL Intramuscular Once Wardell Honour, MD        BP 122/76   Pulse (!) 101   Temp 98.3 F (36.8 C) (Oral)   Ht 5\' 7"  (1.702 m)   Wt 284 lb  12.8 oz (129.2 kg)   SpO2 98%   BMI 44.61 kg/m    Objective:   Physical Exam  Constitutional: She appears well-nourished.  Neck: Neck supple.  Cardiovascular: Normal rate and regular rhythm.   Pulmonary/Chest: Effort normal and breath sounds normal.  Musculoskeletal:       Right hip: She exhibits normal range of motion and normal strength.  Skin: Skin is warm and dry.          Assessment & Plan:  Acute Hip Pain:  Located to right hip x 2-3 months. Suspect secondary to repeat falls coupled with morbid obesity. Exam today unremarkable, good ROM. Will trial flexeril PRN, she will update if no improvement. Consider plain films/PT if no improvement.  Sheral Flow, NP

## 2016-11-24 ENCOUNTER — Encounter: Payer: Self-pay | Admitting: Primary Care

## 2016-11-24 ENCOUNTER — Other Ambulatory Visit: Payer: BC Managed Care – PPO

## 2016-11-24 DIAGNOSIS — M5441 Lumbago with sciatica, right side: Secondary | ICD-10-CM

## 2016-11-24 DIAGNOSIS — M25551 Pain in right hip: Secondary | ICD-10-CM

## 2016-11-27 ENCOUNTER — Other Ambulatory Visit (INDEPENDENT_AMBULATORY_CARE_PROVIDER_SITE_OTHER): Payer: BC Managed Care – PPO

## 2016-11-27 ENCOUNTER — Ambulatory Visit (INDEPENDENT_AMBULATORY_CARE_PROVIDER_SITE_OTHER)
Admission: RE | Admit: 2016-11-27 | Discharge: 2016-11-27 | Disposition: A | Discharge status: Home / Self Care | Payer: BC Managed Care – PPO | Source: Ambulatory Visit | Attending: Primary Care | Admitting: Primary Care

## 2016-11-27 ENCOUNTER — Encounter: Payer: Self-pay | Admitting: Primary Care

## 2016-11-27 DIAGNOSIS — M25551 Pain in right hip: Secondary | ICD-10-CM

## 2016-11-27 DIAGNOSIS — E119 Type 2 diabetes mellitus without complications: Secondary | ICD-10-CM

## 2016-11-27 DIAGNOSIS — M5441 Lumbago with sciatica, right side: Secondary | ICD-10-CM | POA: Diagnosis not present

## 2016-11-27 DIAGNOSIS — Z794 Long term (current) use of insulin: Secondary | ICD-10-CM | POA: Diagnosis not present

## 2016-11-27 DIAGNOSIS — M25559 Pain in unspecified hip: Secondary | ICD-10-CM

## 2016-11-27 LAB — HEMOGLOBIN A1C

## 2016-11-28 MED ORDER — NAPROXEN 500 MG PO TABS: 500.0000 mg | ORAL_TABLET | Freq: Two times a day (BID) | ORAL | 0 refills | Status: DC

## 2016-12-05 ENCOUNTER — Encounter: Payer: Self-pay | Admitting: Primary Care

## 2016-12-05 DIAGNOSIS — Z794 Long term (current) use of insulin: Principal | ICD-10-CM

## 2016-12-05 DIAGNOSIS — E119 Type 2 diabetes mellitus without complications: Secondary | ICD-10-CM

## 2016-12-05 MED ORDER — INSULIN ASPART 100 UNIT/ML FLEXPEN: 18.0000 [IU] | PEN_INJECTOR | Freq: Three times a day (TID) | SUBCUTANEOUS | 5 refills | Status: DC

## 2016-12-05 NOTE — Telephone Encounter (Signed)
Please schedule patient for diabetes follow up in late October (around 6 weeks).  Thanks!

## 2016-12-05 NOTE — Telephone Encounter (Signed)
FYI, disregard prior request to schedule her appointment.

## 2016-12-29 ENCOUNTER — Ambulatory Visit
Admission: RE | Admit: 2016-12-29 | Discharge: 2016-12-29 | Disposition: A | Discharge status: Home / Self Care | Payer: BC Managed Care – PPO | Source: Ambulatory Visit | Attending: Chiropractic Medicine | Admitting: Chiropractic Medicine

## 2016-12-29 ENCOUNTER — Other Ambulatory Visit: Payer: Self-pay | Admitting: Chiropractic Medicine

## 2016-12-29 DIAGNOSIS — R52 Pain, unspecified: Secondary | ICD-10-CM

## 2016-12-29 DIAGNOSIS — R102 Pelvic and perineal pain: Secondary | ICD-10-CM | POA: Insufficient documentation

## 2016-12-29 DIAGNOSIS — M25571 Pain in right ankle and joints of right foot: Secondary | ICD-10-CM | POA: Diagnosis present

## 2016-12-29 DIAGNOSIS — R9389 Abnormal findings on diagnostic imaging of other specified body structures: Secondary | ICD-10-CM | POA: Diagnosis not present

## 2016-12-30 ENCOUNTER — Encounter: Payer: Self-pay | Admitting: Primary Care

## 2017-01-09 ENCOUNTER — Encounter (HOSPITAL_COMMUNITY): Payer: Self-pay

## 2017-01-11 ENCOUNTER — Ambulatory Visit: Payer: Self-pay | Admitting: Primary Care

## 2017-01-16 ENCOUNTER — Other Ambulatory Visit: Payer: Self-pay | Admitting: Primary Care

## 2017-01-16 DIAGNOSIS — M25551 Pain in right hip: Secondary | ICD-10-CM

## 2017-01-16 MED ORDER — CYCLOBENZAPRINE HCL 5 MG PO TABS: 5.0000 mg | ORAL_TABLET | Freq: Three times a day (TID) | ORAL | 0 refills | Status: DC | PRN

## 2017-01-29 ENCOUNTER — Ambulatory Visit: Payer: Self-pay | Admitting: Primary Care

## 2017-02-05 ENCOUNTER — Other Ambulatory Visit: Payer: Self-pay | Admitting: Primary Care

## 2017-02-05 DIAGNOSIS — R110 Nausea: Secondary | ICD-10-CM

## 2017-02-05 NOTE — Telephone Encounter (Signed)
Electronic refill request. Ondansetron Last office visit:   11/13/16 Last Filled:    20 tablet 0 09/01/2016  Please advise.

## 2017-02-14 ENCOUNTER — Ambulatory Visit: Payer: Self-pay | Admitting: Primary Care

## 2017-02-15 ENCOUNTER — Other Ambulatory Visit: Payer: Self-pay | Admitting: Primary Care

## 2017-02-15 ENCOUNTER — Encounter: Payer: Self-pay | Admitting: Primary Care

## 2017-02-15 ENCOUNTER — Ambulatory Visit: Payer: BC Managed Care – PPO | Admitting: Primary Care

## 2017-02-15 VITALS — BP 136/84 | HR 108 | Temp 97.7°F | Ht 67.0 in | Wt 291.8 lb

## 2017-02-15 DIAGNOSIS — E119 Type 2 diabetes mellitus without complications: Secondary | ICD-10-CM | POA: Diagnosis not present

## 2017-02-15 DIAGNOSIS — M79671 Pain in right foot: Secondary | ICD-10-CM | POA: Diagnosis not present

## 2017-02-15 DIAGNOSIS — M25561 Pain in right knee: Secondary | ICD-10-CM | POA: Diagnosis not present

## 2017-02-15 DIAGNOSIS — F411 Generalized anxiety disorder: Secondary | ICD-10-CM

## 2017-02-15 DIAGNOSIS — Z794 Long term (current) use of insulin: Secondary | ICD-10-CM | POA: Diagnosis not present

## 2017-02-15 DIAGNOSIS — Z23 Encounter for immunization: Secondary | ICD-10-CM

## 2017-02-15 LAB — BASIC METABOLIC PANEL
BUN: 15 mg/dL (ref 6–23)
CHLORIDE: 100 meq/L (ref 96–112)
CO2: 26 mEq/L (ref 19–32)
CREATININE: 0.77 mg/dL (ref 0.40–1.20)
Calcium: 9.2 mg/dL (ref 8.4–10.5)
GFR: 83.45 mL/min (ref >60.00)
Glucose, Bld: 389 mg/dL — ABNORMAL HIGH (ref 70–99)
Potassium: 4.8 mEq/L (ref 3.5–5.1)
SODIUM: 137 meq/L (ref 135–145)

## 2017-02-15 LAB — URIC ACID: URIC ACID, SERUM: 5.9 mg/dL (ref 2.4–7.0)

## 2017-02-15 MED ORDER — FLUOXETINE HCL 20 MG PO CAPS: 20.0000 mg | ORAL_CAPSULE | Freq: Every day | ORAL | 1 refills | Status: DC

## 2017-02-15 NOTE — Progress Notes (Signed)
Subjective:    Patient ID: [name], female    DOB: September 10, 1965, 51 y.o.   MRN: [id number]  HPI  Ms. Barman is a 51 year old female with a history of uncontrolled diabetes, hypertension, hyperlipidemia, fractured sacrum, arthritis, morbid obesity who presents today with a chief complaint of lower extremity edema and pain. She also presents for diabetes follow up.  Her swelling is located to the right lower portion of the lower extremity. Her pain is located to the posterior knee down to her ankle, and the dorsal part of her right foot near ankle, and plantar surface of right foot.  She is currently following with orthopedics who is evaluating her for her sacrum. They've also done a few xrays to the right foot, she was told she has bone spurs and arthritis. She's been unable to walk due to right knee pain, right foot pain, right foot numbness, and because of that has slipped and fallen. She's had multiple falls within the last 2 weeks. She's had a cortisone injection to the right knee 2 weeks ago which helped for a few days. She was told that her symptoms are all secondary to her sacral fracture. She's not in physical therapy.  1) Type 2 Diabetes:  Current medications include: Levemir 35 units twice daily, she increased this to 38 units BID 2 weeks ago without our knowledge. Novolog 18 units three times daily before meals.   She is checking her blood glucose twice weekly. AM Fasting: 100-120 Bedtime: 150-175  Last A1C: 11.1 Last Eye Exam: Due Last Foot Exam: Completed in August 2018 Pneumonia Vaccination: Completed in 2018 ACE/ARB: ARB Statin: Lipitor  Diet currently consists of:  Breakfast: Bacon, cheese grits Lunch: Soup, salad, taco  Dinner: Fish, chicken, vegetable Snacks: Peanuts, crackers Desserts: Sugar free ice cream Beverages: Some water, Coke  Exercise: She is not exercising.      Review of Systems  Respiratory: Negative for shortness of breath.     Cardiovascular: Negative for chest pain.  Musculoskeletal:       Right buttocks, right knee, right foot pain  Neurological: Positive for numbness.       Past Medical History:  Diagnosis Date  . Allergy   . Anemia   . Anxiety   . Arthritis   . Asthma   . Colon polyp   . Diabetes mellitus   . Edema   . Fatigue   . Hypertension   . IBS (irritable bowel syndrome)   . Lump in female breast   . Methicillin resistant Staphylococcus aureus in conditions classified elsewhere and of unspecified site   . Migraines   . Morbid obesity ([place])   . Night sweats   . Nonspecific abnormal results of thyroid function study   . Other B-complex deficiencies   . Paresthesias   . Pure hypercholesterolemia   . Reflux   . Sinus problem   . Symptomatic states associated with artificial menopause   . Thyroid disease   . Type II or unspecified type diabetes mellitus without mention of complication, not stated as uncontrolled   . Unspecified sleep apnea      Social History   Socioeconomic History  . Marital status: Married    Spouse name: Not on file  . Number of children: 3  . Years of education: college  . Highest education level: Not on file  Social Needs  . Financial resource strain: Not on file  . Food insecurity - worry: Not on file  .  Food insecurity - inability: Not on file  . Transportation needs - medical: Not on file  . Transportation needs - non-medical: Not on file  Occupational History  . Occupation: Data processing manager: [employer]  Tobacco Use  . Smoking status: Never Smoker  . Smokeless tobacco: Never Used  Substance and Sexual Activity  . Alcohol use: Yes    Alcohol/week: 0.0 oz    Comment: rarely - once per week at most  . Drug use: No  . Sexual activity: No    Birth control/protection: None  Other Topics Concern  . Not on file  Social History Narrative   Exercise: walking two days per week, 30 minutes.       Married x 29 years, happily married; no abuse.        Children: 3 children; 1 grandchild; two step grandchildren; 1 gg.      Lives: with husband, mother-in-law.      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      Tobacco: never      Alcohol: socially.  One glass of wine per month.      Exercise:  Walking around campus.        Seatbelt: 100%      Guns: gun safe in garage.    Past Surgical History:  Procedure Laterality Date  . abcess removal  1997   MRSA  . ABDOMINAL HYSTERECTOMY  1997   complete in 1997, partial was in 1995  . carpal tunnel release r  07/03/13   Dalldorf  . [place]  . CHOLECYSTECTOMY  03/1989  . COLONOSCOPY  03/21/2011   Normal.  [facility].  [name] KNEE SURGERY  2006  . LAPAROSCOPIC GASTRIC BANDING  05/2009  . TONSILLECTOMY AND ADENOIDECTOMY  1974  . TUBAL LIGATION      Family History  Problem Relation Age of Onset  . Diabetes Mother   . Heart disease Mother 2       CHF, CAD  . Other Mother        muscle disease  . Hypertension Mother   . Hyperlipidemia Mother   . Arthritis Mother        OA hip L s/p THR  . Diabetes Father   . Heart disease Father        AMI/CABG/valve replacement age 33.  . Stroke Father   . Diabetes Brother   . Heart disease Brother        stent age 45.  . Fibromyalgia Daughter   . Crohn's disease Daughter   . Hypertension Son   . Alzheimer's disease Maternal Grandmother   . Emphysema Maternal Grandfather   . Heart disease Paternal Grandmother   . Breast cancer Unknown        2 Aunts    Allergies  Allergen Reactions  . Amoxicillin Shortness Of Breath and Rash  . Levofloxacin Shortness Of Breath and Rash  . Meloxicam Hives and Rash  . Vancomycin Anaphylaxis  . Sulfa Antibiotics Rash  . Ace Inhibitors Cough  . Codeine   . Meperidine Hcl   . Oxycodone-Acetaminophen   . Oxycodone-Acetaminophen   . Penicillins Swelling and Rash  . Tequin [Gatifloxacin] Rash    Current Outpatient Medications on File Prior to Visit  Medication Sig  Dispense Refill  . albuterol (PROVENTIL HFA;VENTOLIN HFA) 108 (90 BASE) MCG/ACT inhaler Inhale 2 puffs into the lungs every 6 (six) hours as needed for wheezing. 1 Inhaler 1  .  atorvastatin (LIPITOR) 40 MG tablet TAKE 1 TABLET BY MOUTH DAILY 90 tablet 3  . azelastine (ASTELIN) 0.1 % nasal spray Place 2 sprays into both nostrils 2 (two) times daily. Use in each nostril as directed 30 mL 11  . beclomethasone (QVAR) 80 MCG/ACT inhaler Inhale 2 puffs into the lungs every 12 (twelve) hours as needed. 1 Inhaler 11  . CALCIUM PO Take by mouth 3 (three) times daily.      . Cetirizine HCl (ZYRTEC PO) Take by mouth daily.      . cyclobenzaprine (FLEXERIL) 5 MG tablet Take 1 tablet (5 mg total) by mouth 3 (three) times daily as needed for muscle spasms. 30 tablet 0  . fenofibrate (TRICOR) 145 MG tablet Take 1 tablet (145 mg total) by mouth daily. 90 tablet 1  . Fexofenadine HCl (ALLEGRA PO) Take by mouth.      [name] FLUoxetine (PROZAC) 20 MG tablet Take 2 tablets (40 mg total) by mouth daily. 180 tablet 3  . fluticasone (FLONASE) 50 MCG/ACT nasal spray Place 2 sprays into both nostrils daily. 16 g 11  . gabapentin (NEURONTIN) 300 MG capsule TAKE 1 CAPSULE BY MOUTH THREE TIMES A DAY 270 capsule 3  . glipiZIDE-metformin (METAGLIP) 2.5-500 MG tablet TAKE TWO (2) TABLETS BY MOUTH 2 TIMES DAILY BEFORE A MEAL. 360 tablet 1  . hydrochlorothiazide (HYDRODIURIL) 25 MG tablet Take 0.5 tablets (12.5 mg total) by mouth daily. 45 tablet 3  . insulin aspart (NOVOLOG FLEXPEN) 100 UNIT/ML FlexPen Inject 18 Units into the skin 3 (three) times daily with meals. 15 mL 5  . Insulin Detemir (LEVEMIR) 100 UNIT/ML Pen Inject 35 units in the morning and 35 units at night. 30 mL 5  . Insulin Pen Needle (UNIFINE PENTIPS) 32G X 4 MM MISC Use as directed for insulin injection daily. 100 each 11  . JANUVIA 100 MG tablet TAKE 1 TABLET BY MOUTH DAILY 90 tablet 1  . ketoconazole (NIZORAL) 2 % cream Apply 1 application topically 2 (two) times  daily as needed for irritation. 60 g 0  . levalbuterol (XOPENEX) 1.25 MG/3ML nebulizer solution Take 1.25 mg by nebulization daily as needed.    [name] levocetirizine (XYZAL) 5 MG tablet Take 1 tablet (5 mg total) by mouth every evening. 90 tablet 0  . losartan (COZAAR) 100 MG tablet TAKE 1/2 TABLET BY MOUTH ONCE DAILY 90 tablet 1  . Multiple Vitamin (MULTIVITAMIN PO) Take by mouth daily.      . naproxen (NAPROSYN) 500 MG tablet Take 1 tablet (500 mg total) by mouth 2 (two) times daily with a meal. 30 tablet 0  . Nystatin (NYAMYC) 100000 UNIT/GM POWD APPLY TO AFFECTED AREA TWICE A DAY 60 g 5  . nystatin-triamcinolone (MYCOLOG II) cream Apply topically 2 (two) times daily. 60 g 3  . ondansetron (ZOFRAN-ODT) 4 MG disintegrating tablet TAKE 1 TABLET BY MOUTH EVERY 8 HOURS AS NEEDED FOR NAUSEA AS DIRECTED. 20 tablet 0   Current Facility-Administered Medications on File Prior to Visit  Medication Dose Route Frequency Provider Last Rate Last Dose  . hepatitis B vac recombinant (RECOMBIVAX) injection 5 mcg  0.5 mL Intramuscular Once Wardell Honour, MD        BP 136/84   Pulse (!) 108   Temp 97.7 F (36.5 C) (Oral)   Ht 5\' 7"  (1.702 m)   Wt 291 lb 12.8 oz (132.4 kg)   SpO2 97%   BMI 45.70 kg/m    Objective:   Physical Exam  Constitutional: She appears well-nourished.  Cardiovascular: Normal rate and regular rhythm.  Pulses:      Dorsalis pedis pulses are 2+ on the right side, and 2+ on the left side.       Posterior tibial pulses are 2+ on the right side, and 2+ on the left side.  Very mild generalized swelling to right lower extremity distal to knee down to dorsal foot. Negative homan's sign.   Pulmonary/Chest: Effort normal and breath sounds normal.  Musculoskeletal:       Feet:  No calf swelling. Moderate tenderness to right dorsal foot. 4/5 strength to right lower extremity.   Skin: Skin is warm and dry.          Assessment & Plan:  Acute knee and foot pain:  Diagnosis of  right sacral fracture in mid October.  Following with orthopedics. Or so believes knee and foot pain secondary to sacral fracture. Decreased function of right lower extremity from knee to foot since, also with numerous falls. Given this we will send her to physical therapy for strengthening. Discussed fall risk precautions.  Sheral Flow, NP

## 2017-02-15 NOTE — Assessment & Plan Note (Signed)
Due for repeat A1c in 2 weeks. Strongly encouraged her to check her blood sugars 3 times daily before meals, especially since she is using NovoLog 3 times daily.  Continue with increased dose of Levemir at 38 units twice daily.  Continue to work on diet, start exercising when possible. Managed on statin and arb.  Pneumonia vaccination up-to-date.  Recommended eye exam.  Will await A1c results in order to determine next follow-up visit, will be 1 month or 3 month follow-up.

## 2017-02-15 NOTE — Patient Instructions (Signed)
Complete lab work prior to leaving today. I will notify you of your results once received.   Stop by the front desk and speak with either Rosaria Ferries or Shirlean Mylar regarding your referral to physical therapy.  Schedule a lab only appointment in 2 weeks to repeat your A1C. I'll be in touch once I receive the results.  It was a pleasure to see you today!

## 2017-02-16 ENCOUNTER — Other Ambulatory Visit: Payer: Self-pay | Admitting: Primary Care

## 2017-02-16 DIAGNOSIS — M25551 Pain in right hip: Secondary | ICD-10-CM

## 2017-02-19 ENCOUNTER — Telehealth: Payer: Self-pay | Admitting: Primary Care

## 2017-02-19 ENCOUNTER — Ambulatory Visit: Payer: BC Managed Care – PPO | Attending: Primary Care | Admitting: Physical Therapy

## 2017-02-19 ENCOUNTER — Encounter: Payer: Self-pay | Admitting: Primary Care

## 2017-02-19 DIAGNOSIS — R29898 Other symptoms and signs involving the musculoskeletal system: Secondary | ICD-10-CM

## 2017-02-19 DIAGNOSIS — R262 Difficulty in walking, not elsewhere classified: Secondary | ICD-10-CM | POA: Diagnosis present

## 2017-02-19 DIAGNOSIS — M25571 Pain in right ankle and joints of right foot: Secondary | ICD-10-CM | POA: Insufficient documentation

## 2017-02-19 DIAGNOSIS — M25551 Pain in right hip: Secondary | ICD-10-CM

## 2017-02-19 NOTE — Therapy (Signed)
[facility] [address] [place], [address] Phone: [phone]   Fax:  [phone]  Physical Therapy Evaluation  Patient Details  Name: [name] MRN: [id number] Date of Birth: 1965-09-01 Referring Provider: Alma Friendly NP   Encounter Date: 02/19/2017  PT End of Session - 02/19/17 1005    Visit Number  1    Number of Visits  17    Date for PT Re-Evaluation  05/14/17    PT Start Time  0830    PT Stop Time  0945    PT Time Calculation (min)  75 min    Activity Tolerance  Patient tolerated treatment well    Behavior During Therapy  [facility] for tasks assessed/performed       Past Medical History:  Diagnosis Date  . Allergy   . Anemia   . Anxiety   . Arthritis   . Asthma   . Colon polyp   . Diabetes mellitus   . Edema   . Fatigue   . Hypertension   . IBS (irritable bowel syndrome)   . Lump in female breast   . Methicillin resistant Staphylococcus aureus in conditions classified elsewhere and of unspecified site   . Migraines   . Morbid obesity ([place])   . Night sweats   . Nonspecific abnormal results of thyroid function study   . Other B-complex deficiencies   . Paresthesias   . Pure hypercholesterolemia   . Reflux   . Sacral fracture ([place])   . Sinus problem   . Symptomatic states associated with artificial menopause   . Thyroid disease   . Type II or unspecified type diabetes mellitus without mention of complication, not stated as uncontrolled   . Unspecified sleep apnea     Past Surgical History:  Procedure Laterality Date  . abcess removal  1997   MRSA  . ABDOMINAL HYSTERECTOMY  1997   complete in 1997, partial was in 1995  . carpal tunnel release r  07/03/13   Dalldorf  . [place]  . CHOLECYSTECTOMY  03/1989  . COLONOSCOPY  03/21/2011   Normal.  [facility].  [name] KNEE SURGERY  2006  . LAPAROSCOPIC GASTRIC BANDING  05/2009  . TONSILLECTOMY AND ADENOIDECTOMY   1974  . TUBAL LIGATION      There were no vitals filed for this visit.   Subjective Assessment - 02/19/17 0833    Subjective  Patient reports she fell and broke her R sacrum this past fall. She has had R foot pain, and R knee weakness since that time. She reports the bottom of her R foot is now numb. She is now using a cane, had been using a RW. She had 6 falls in 5 days. This all initially started when she fell in September (x2), She was in a boot for 2 weeks, began having bottom of the foot pain. She saw a provider who instructed her to wear the foot boot again, she fell a few more times as she would hit the boot on something. She is unsure why she is falling, she thinks her knee just gave out. She reports the pain gets worse throughout the day, but is quite uncomfortable with her balance as well.     Limitations  Standing;Walking;House hold activities    Patient Stated Goals  Wants to be able to get her life back.     Currently in Pain?  Yes  Pain Score  7     Pain Location  Buttocks    Pain Orientation  Right;Posterior    Pain Descriptors / Indicators  Aching;Sore    Pain Type  Chronic pain    Pain Onset  More than a month ago    Pain Frequency  Intermittent         [facility] PT Assessment - 02/19/17 1016      Assessment   Medical Diagnosis  Acute pain of R knee and foot    Referring Provider  Alma Friendly NP    Onset Date/Surgical Date  -- September 2018    Prior Therapy  None      Precautions   Precautions  Fall      Restrictions   Weight Bearing Restrictions  No      Balance Screen   Has the patient fallen in the past 6 months  Yes    How many times?  6    Has the patient had a decrease in activity level because of a fear of falling?   Yes    Is the patient reluctant to leave their home because of a fear of falling?   Yes      [place] residence    Living Arrangements  Spouse/significant other    Available Help at [place] to enter    Entrance Stairs-Number of Steps  4    Entrance Stairs-Rails  Can reach both      Prior Function   Level of Independence  Independent    Leisure  Plays with grandchildren      Cognition   Overall Cognitive Status  Within Functional Limits for tasks assessed      Observation/Other Assessments   Lower Extremity Functional Scale   23      Sensation   Light Touch  -- Diminished over 5th met, Lateral R ankle, plantar R surface      65mwalk time- 28.92 seconds   Unable to complete sit to stand with use of PT UEs for support, when she performs sit to stand, lateral flexion to her L side, heavy use of UEs, minimal to no effort through RLE.   TUG- 19.89 seconds with cane   SL stance- unable to complete without mod A from therapist bilaterally. More difficulty on LLE than RLE, likely contributed by loss of medial arch in L foot > R foot in single leg stance   Bridging - L side comes up higher than her R side.   Reflexes were difficult to find at her ankles, 1+ at patella bilaterally   Notable for decreased sensation in lateral R ankle around malleolus, wrapping around to 5th metatarsal and onto plantar aspect of her foot. L side - WNL   MMT R side  Plantar flexion - 3 or 4+/5 DF - 3/3+/5  Eversion - 4-/5 Inversion 4-/4/ 5  Hamstring - 3< /5 Quad - 4-/4/5 Hip flexor 3+/4-/5  R clamshell - some buttock pain, notable for weakness 3+/4-/5   5/5 for all the above on LLE   SLR - <10 degrees on R secondary to weakness, full ROM on LLE   Palpation - tender throughout gluteals on R side, wrapping into greater trochanter.   TherEx Supine isometric clamshells 2 sets x 8 repetitions for 10" holds (patient reports immediate decrease in pain)  Manually assisted seated towel calf stretch x 10 repetitions for  5-15" holds with discomfort in calf, but immediate decrease in symptoms with stretch provided Discussed pillow between the knees at night to  reduce compression on gluteal tendons.      Objective measurements completed on examination: See above findings.              PT Education - 02/19/17 1016    Education provided  Yes    Education Details  Concerns about decreased sensation and strength causing gait abnormality, plan of care, PT will call referring provider.     Person(s) Educated  Patient;Spouse    Methods  Demonstration;Explanation;Handout    Comprehension  Verbalized understanding;Returned demonstration       PT Short Term Goals - 02/19/17 1015      PT SHORT TERM GOAL #1   Title  Patient will follow up with primary referring provider to initiate plan of care regarding RLE weakness to reduce risk of falling.     Time  1    Period  Weeks    Status  New    Target Date  02/26/17        PT Long Term Goals - 02/19/17 1013      PT LONG TERM GOAL #1   Title  Patient will demonstrate 67mwalk time of less than 20" to demonstrate decreased falls risk.       PT LONG TERM GOAL #2   Title  Patient will transfer sit to stand with min A from UEs to demonstrate improved LE strength to decrease falls risk.     Time  12    Period  Weeks    Status  New    Target Date  05/14/17      PT LONG TERM GOAL #3   Title  Patient will demonstrate single leg stance of at least 5" bilaterally to demonstrate decreased falls risk.     Time  12    Period  Weeks    Status  New    Target Date  05/14/17      PT LONG TERM GOAL #4   Title  Patient will demonstrate Berg Balance Score of greater than 45 to demonstrate decreased falls risk.     Time  12    Period  Weeks    Status  New    Target Date  05/14/17             Plan - 02/19/17 1007    Clinical Impression Statement  Patient is a pleasant 51y/o female that presents with complaints of recurrent falls, R plantar surface foot pain, RLE weakness, and R ankle/mid-foot pain. She suffered a fall on her R side this fall and suffered a R pelvic fx. Since that time she  has had recurrent falls on her R side, stating she felt her knee give way. In this examination her MMT indicates severe R thigh and ankle muscle weakness, diminished sensation over distal R lower leg and foot, associated altered gait pattern. Her gait pattern is indicative of severe R quad weakness, as she uses ligamentous support to "lock" her knee into extension so as not to buckle, as well as having drop foot. She states this altered gait pattern is acutely onset since her falls. All of this is highly concerning for acute neurologic injury, will discuss with referring provider for next steps in management. She has pain between 3rd and 4th toes consistent with Morton's neuroma and suggested podiatry consult for this. Her hip pain appears related to gluteal tendinopathy,  which will be managed with isometric clamshells and pillow between the knees. Her ankle/dorsal foot pain appears related to loss of DF ROM and strength, which was improved with stretching in this session. She would benefit from skilled PT services to reduce her falls risk, and will be referred to appropriate providers to elicit source of severe RLE weakness.     Clinical Presentation  Evolving    Clinical Decision Making  High    Rehab Potential  Fair    Clinical Impairments Affecting Rehab Potential  Multiple tests concerning for neurologic involvement     PT Frequency  2x / week    PT Duration  12 weeks    PT Treatment/Interventions  Dry needling;Balance training;Neuromuscular re-education;Gait training;DME Instruction;Aquatic Therapy;Biofeedback;Cryotherapy;Electrical Stimulation;Ultrasound;Moist Heat;Iontophoresis '4mg'$ /ml Dexamethasone;Stair training;Functional mobility training;Therapeutic activities;Therapeutic exercise;Orthotic Fit/Training;Patient/family education;Manual techniques;Taping    PT Next Visit Plan  Progress quadricep/hamstring strengthening in [place], posterior hip musculature strengthening as tolerated.     PT Home  Exercise Plan  Calf stretching, Supine hip isometric clamshells     Recommended Other Services  Neurology/Neurosurgery consult for stark RLE weakness     Consulted and Agree with Plan of Care  Patient;Family member/caregiver    Family Member Consulted  Spouse        Patient will benefit from skilled therapeutic intervention in order to improve the following deficits and impairments:  Abnormal gait, Pain, Decreased endurance, Decreased activity tolerance, Decreased strength, Obesity, Difficulty walking, Decreased balance, Decreased safety awareness, Increased edema, Impaired sensation, Decreased knowledge of use of DME  Visit Diagnosis: Difficulty in walking, not elsewhere classified - Plan: PT plan of care cert/re-cert  Pain in right ankle and joints of right foot - Plan: PT plan of care cert/re-cert  Weakness of right leg - Plan: PT plan of care cert/re-cert  Pain in right hip - Plan: PT plan of care cert/re-cert     Problem List Patient Active Problem List   Diagnosis Date Noted  . Right lower quadrant abdominal pain 08/22/2016  . Abnormal TSH 01/10/2016  . Morbid obesity ([place]) 01/12/2014  . Routine general medical examination at a health care facility 10/28/2012  . GERD (gastroesophageal reflux disease) 10/28/2012  . Anxiety 12/20/2011  . Allergic rhinitis 12/20/2011  . Asthma 12/20/2011  . Lapband APL with HH repair 03/31/2011  . Type 2 diabetes mellitus ([place]) 06/30/2007  . Hyperlipidemia 06/30/2007  . Essential hypertension 06/27/2007   Royce Macadamia PT, DPT, CSCS    02/19/2017, 11:35 AM  [facility] [address], [place], [address] Phone: [phone]   Fax:  [phone]  Name: [name] MRN: [id number] Date of Birth: Nov 03, 1965

## 2017-02-19 NOTE — Telephone Encounter (Signed)
Copied from [place]. Topic: Quick Communication - See Telephone Encounter >> Feb 19, 2017  9:19 AM Synthia Innocent wrote: CRM for notification. See Telephone encounter for:  Patient seen today w/ [facility] PT for Rt knee/ankle, but is seeing neurological problems. Would like to speak with provider.  02/19/17.

## 2017-02-20 ENCOUNTER — Other Ambulatory Visit: Payer: Self-pay | Admitting: Primary Care

## 2017-02-20 ENCOUNTER — Encounter: Payer: Self-pay | Admitting: Physical Therapy

## 2017-02-20 DIAGNOSIS — F411 Generalized anxiety disorder: Secondary | ICD-10-CM

## 2017-02-20 NOTE — Telephone Encounter (Signed)
Spoke with Saralyn Pilar from physical therapy this morning (02/20/17) who reports he called yesterday requesting to speak with me. No message was received by me yesterday, only this morning when he called into our direct line. This message was delayed one day which is not appropriate. This patient needs urgent referral to neurosurgery which was placed this morning.

## 2017-02-20 NOTE — Telephone Encounter (Signed)
Call to [facility]- Physical therapist needs to speak with [name] regarding her patient- he is awaiting her call. [phone]

## 2017-02-21 ENCOUNTER — Ambulatory Visit: Payer: BC Managed Care – PPO | Admitting: Primary Care

## 2017-02-22 ENCOUNTER — Ambulatory Visit: Payer: BC Managed Care – PPO | Admitting: Primary Care

## 2017-02-22 ENCOUNTER — Ambulatory Visit: Payer: Self-pay | Admitting: Primary Care

## 2017-02-22 ENCOUNTER — Encounter: Payer: BC Managed Care – PPO | Admitting: Physical Therapy

## 2017-02-26 ENCOUNTER — Ambulatory Visit: Payer: BC Managed Care – PPO | Admitting: Podiatry

## 2017-02-26 ENCOUNTER — Ambulatory Visit: Payer: BC Managed Care – PPO | Admitting: Physical Therapy

## 2017-02-27 ENCOUNTER — Other Ambulatory Visit: Payer: Self-pay | Admitting: Primary Care

## 2017-02-27 ENCOUNTER — Encounter: Payer: Self-pay | Admitting: Primary Care

## 2017-02-27 ENCOUNTER — Ambulatory Visit: Payer: BC Managed Care – PPO | Admitting: Podiatry

## 2017-02-27 DIAGNOSIS — M25551 Pain in right hip: Secondary | ICD-10-CM

## 2017-02-28 ENCOUNTER — Ambulatory Visit: Payer: BC Managed Care – PPO | Admitting: Physical Therapy

## 2017-02-28 ENCOUNTER — Encounter: Payer: Self-pay | Admitting: Primary Care

## 2017-02-28 ENCOUNTER — Other Ambulatory Visit: Payer: Self-pay | Admitting: Primary Care

## 2017-02-28 DIAGNOSIS — M25559 Pain in unspecified hip: Secondary | ICD-10-CM

## 2017-02-28 MED ORDER — CYCLOBENZAPRINE HCL 5 MG PO TABS: ORAL_TABLET | ORAL | 0 refills | Status: DC

## 2017-02-28 MED ORDER — NAPROXEN 500 MG PO TABS: 500.0000 mg | ORAL_TABLET | Freq: Two times a day (BID) | ORAL | 0 refills | Status: DC

## 2017-02-28 NOTE — Addendum Note (Signed)
Addended by: Jacqualin Combes on: 02/28/2017 04:22 PM   Modules accepted: Orders

## 2017-03-07 ENCOUNTER — Ambulatory Visit: Payer: BC Managed Care – PPO | Admitting: Physical Therapy

## 2017-03-09 ENCOUNTER — Ambulatory Visit: Payer: BC Managed Care – PPO | Admitting: Physical Therapy

## 2017-03-12 ENCOUNTER — Ambulatory Visit: Payer: BC Managed Care – PPO | Admitting: Physical Therapy

## 2017-03-14 ENCOUNTER — Other Ambulatory Visit: Payer: Self-pay | Admitting: Neurosurgery

## 2017-03-14 DIAGNOSIS — M5416 Radiculopathy, lumbar region: Secondary | ICD-10-CM

## 2017-03-15 ENCOUNTER — Encounter: Payer: Self-pay | Admitting: Primary Care

## 2017-03-15 ENCOUNTER — Encounter: Payer: BC Managed Care – PPO | Admitting: Physical Therapy

## 2017-03-16 ENCOUNTER — Ambulatory Visit
Admission: RE | Admit: 2017-03-16 | Discharge: 2017-03-16 | Disposition: A | Discharge status: Home / Self Care | Payer: BC Managed Care – PPO | Source: Ambulatory Visit | Attending: Neurosurgery | Admitting: Neurosurgery

## 2017-03-16 DIAGNOSIS — M4807 Spinal stenosis, lumbosacral region: Secondary | ICD-10-CM | POA: Insufficient documentation

## 2017-03-16 DIAGNOSIS — M5126 Other intervertebral disc displacement, lumbar region: Secondary | ICD-10-CM | POA: Diagnosis not present

## 2017-03-16 DIAGNOSIS — M48061 Spinal stenosis, lumbar region without neurogenic claudication: Secondary | ICD-10-CM | POA: Diagnosis not present

## 2017-03-16 DIAGNOSIS — M5416 Radiculopathy, lumbar region: Secondary | ICD-10-CM | POA: Diagnosis present

## 2017-03-21 ENCOUNTER — Encounter: Payer: BC Managed Care – PPO | Admitting: Physical Therapy

## 2017-03-21 ENCOUNTER — Encounter: Payer: Self-pay | Admitting: Primary Care

## 2017-03-23 ENCOUNTER — Encounter: Payer: BC Managed Care – PPO | Admitting: Physical Therapy

## 2017-03-26 ENCOUNTER — Other Ambulatory Visit: Payer: Self-pay | Admitting: Primary Care

## 2017-03-26 ENCOUNTER — Encounter: Payer: BC Managed Care – PPO | Admitting: Physical Therapy

## 2017-03-26 DIAGNOSIS — I10 Essential (primary) hypertension: Secondary | ICD-10-CM

## 2017-03-28 ENCOUNTER — Encounter: Payer: BC Managed Care – PPO | Admitting: Physical Therapy

## 2017-04-02 ENCOUNTER — Encounter: Payer: BC Managed Care – PPO | Admitting: Physical Therapy

## 2017-04-04 ENCOUNTER — Encounter: Payer: BC Managed Care – PPO | Admitting: Physical Therapy

## 2017-04-08 ENCOUNTER — Encounter: Payer: Self-pay | Admitting: Primary Care

## 2017-04-08 DIAGNOSIS — Z794 Long term (current) use of insulin: Principal | ICD-10-CM

## 2017-04-08 DIAGNOSIS — E119 Type 2 diabetes mellitus without complications: Secondary | ICD-10-CM

## 2017-04-09 ENCOUNTER — Encounter: Payer: BC Managed Care – PPO | Admitting: Physical Therapy

## 2017-04-09 MED ORDER — GLIPIZIDE-METFORMIN HCL 2.5-500 MG PO TABS: ORAL_TABLET | ORAL | 1 refills | Status: DC

## 2017-04-11 ENCOUNTER — Encounter: Payer: BC Managed Care – PPO | Admitting: Physical Therapy

## 2017-04-16 ENCOUNTER — Encounter: Payer: BC Managed Care – PPO | Admitting: Physical Therapy

## 2017-04-18 ENCOUNTER — Encounter: Payer: BC Managed Care – PPO | Admitting: Physical Therapy

## 2017-04-24 ENCOUNTER — Other Ambulatory Visit (HOSPITAL_COMMUNITY): Payer: Self-pay | Admitting: Neurosurgery

## 2017-04-24 DIAGNOSIS — R609 Edema, unspecified: Secondary | ICD-10-CM

## 2017-04-25 ENCOUNTER — Ambulatory Visit (HOSPITAL_COMMUNITY)
Admission: RE | Admit: 2017-04-25 | Discharge: 2017-04-25 | Disposition: A | Discharge status: Home / Self Care | Payer: BC Managed Care – PPO | Source: Ambulatory Visit | Attending: Neurosurgery | Admitting: Neurosurgery

## 2017-04-25 DIAGNOSIS — R609 Edema, unspecified: Secondary | ICD-10-CM

## 2017-04-25 DIAGNOSIS — M7989 Other specified soft tissue disorders: Secondary | ICD-10-CM | POA: Diagnosis present

## 2017-04-25 NOTE — Progress Notes (Signed)
RLE venous duplex prelim: negative for DVT. Landry Mellow, RDMS, RVT  Called results to [facility].

## 2017-05-22 ENCOUNTER — Encounter: Payer: Self-pay | Admitting: Primary Care

## 2017-05-28 ENCOUNTER — Ambulatory Visit: Payer: BC Managed Care – PPO | Admitting: Primary Care

## 2017-05-28 ENCOUNTER — Encounter: Payer: Self-pay | Admitting: Primary Care

## 2017-05-28 VITALS — BP 120/76 | HR 109 | Temp 98.8°F | Ht 67.0 in | Wt 286.8 lb

## 2017-05-28 DIAGNOSIS — Z794 Long term (current) use of insulin: Secondary | ICD-10-CM | POA: Diagnosis not present

## 2017-05-28 DIAGNOSIS — E782 Mixed hyperlipidemia: Secondary | ICD-10-CM | POA: Diagnosis not present

## 2017-05-28 DIAGNOSIS — E119 Type 2 diabetes mellitus without complications: Secondary | ICD-10-CM | POA: Diagnosis not present

## 2017-05-28 DIAGNOSIS — R29898 Other symptoms and signs involving the musculoskeletal system: Secondary | ICD-10-CM

## 2017-05-28 LAB — LDL CHOLESTEROL, DIRECT: Direct LDL: 88 mg/dL

## 2017-05-28 LAB — LIPID PANEL
CHOL/HDL RATIO: 3
CHOLESTEROL: 187 mg/dL (ref 0–200)
HDL: 56.1 mg/dL (ref >39.00)
NonHDL: 130.53
TRIGLYCERIDES: 271 mg/dL — AB (ref 0.0–149.0)
VLDL: 54.2 mg/dL — AB (ref 0.0–40.0)

## 2017-05-28 LAB — HEMOGLOBIN A1C: HEMOGLOBIN A1C: 9.7 % — AB (ref 4.6–6.5)

## 2017-05-28 MED ORDER — FREESTYLE LIBRE 14 DAY SENSOR MISC: 1.0000 | 0 refills | Status: DC

## 2017-05-28 MED ORDER — FREESTYLE LIBRE 14 DAY READER DEVI: 1.0000 | Freq: Once | 0 refills | Status: AC

## 2017-05-28 NOTE — Assessment & Plan Note (Signed)
Since falling in November 2018. Underwent urgent surgery to lumbar spine in December due to foot drop.   Discussed that recovery for foot drop may take months to years. Continue regular PT. Recommended she discuss symptoms with neurosurgeon later this month. Continue using walker. Pulses 2+ bilaterally.

## 2017-05-28 NOTE — Assessment & Plan Note (Signed)
Is not checking blood sugars which makes today's visit very tough. She is compliant to her insulin, commended her on this, but also discussed how dangerous this can be if she's not checking her blood sugars.  Repeat A1C pending today, if still above goal, then will send to endocrinology. She verbalized understanding.

## 2017-05-28 NOTE — Progress Notes (Signed)
Subjective:    Patient ID: [name], female    DOB: [birth year], 52 y.o.   MRN: [id number]  HPI  Ms. Carranza is a 52 year old female who presents today for follow up.  1) Type 2 Diabetes:  Current medications include: glipizide-metformin 2.5-500 (2 tabs) twice daily, Januvia 100 mg,  Levemir 38 units in the morning and 38 units in the evening. She's doing Novolog 18 units three times daily with meals.   She is not checking her blood sugars as she doesn't like to prick her finger. She's improved her diet by not drinking regular sodas, is eating sugar free ice cream rather than regular. She is not exercising.   Last A1C: 11.1 in September 2018 Last Eye Exam: Overdue. Last Foot Exam: Completed in August 2018 Pneumonia Vaccination: Completed in 2018 ACE/ARB: Losartan Statin: Lipitor  2) Right Lower Extremity Weakness: Also with chronic back pain that has been present since November 2018 after a fall. Underwent urgent surgery to her lumbar spine in December 2018 given evidence of foot drop with lower extremity weakness. Currently following with physical therapy at [facility] and has undergone three weeks of sessions. Also following with Dr. Annette Stable through Neurosurgery.   She's very worried about her right lower extremity weakness as she's still unable to drive, she continues to fall as her right foot "slides" on the floor. She uses her walker regularly but sometimes loses balance. She's fallen several times since her surgery. She will be seeing her neurosurgeon later this month. She is compliant with PT.     Review of Systems  Respiratory: Negative for shortness of breath.   Cardiovascular: Negative for chest pain.  Musculoskeletal: Positive for back pain.       Right lower extremity weakness  Skin: Negative for color change.  Neurological: Positive for weakness and numbness.       Past Medical History:  Diagnosis Date  . Allergy   . Anemia   . Anxiety   . Arthritis     . Asthma   . Colon polyp   . Diabetes mellitus   . Edema   . Fatigue   . Hypertension   . IBS (irritable bowel syndrome)   . Lump in female breast   . Methicillin resistant Staphylococcus aureus in conditions classified elsewhere and of unspecified site   . Migraines   . Morbid obesity ([place])   . Night sweats   . Nonspecific abnormal results of thyroid function study   . Other B-complex deficiencies   . Paresthesias   . Pure hypercholesterolemia   . Reflux   . Sacral fracture ([place])   . Sinus problem   . Symptomatic states associated with artificial menopause   . Thyroid disease   . Type II or unspecified type diabetes mellitus without mention of complication, not stated as uncontrolled   . Unspecified sleep apnea      Social History   Socioeconomic History  . Marital status: Married    Spouse name: Not on file  . Number of children: 3  . Years of education: college  . Highest education level: Not on file  Social Needs  . Financial resource strain: Not on file  . Food insecurity - worry: Not on file  . Food insecurity - inability: Not on file  . Transportation needs - medical: Not on file  . Transportation needs - non-medical: Not on file  Occupational History  . Occupation: Data processing manager: [employer]  Tobacco Use  . Smoking status: Never Smoker  . Smokeless tobacco: Never Used  Substance and Sexual Activity  . Alcohol use: Yes    Alcohol/week: 0.0 oz    Comment: rarely - once per week at most  . Drug use: No  . Sexual activity: No    Birth control/protection: None  Other Topics Concern  . Not on file  Social History Narrative   Exercise: walking two days per week, 30 minutes.       Married x 29 years, happily married; no abuse.       Children: 3 children; 1 grandchild; two step grandchildren; 1 gg.      Lives: with husband, mother-in-law.      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      Tobacco: never      Alcohol: socially.  One  glass of wine per month.      Exercise:  Walking around campus.        Seatbelt: 100%      Guns: gun safe in garage.    Past Surgical History:  Procedure Laterality Date  . abcess removal  1997   MRSA  . ABDOMINAL HYSTERECTOMY  1997   complete in 1997, partial was in 1995  . carpal tunnel release r  07/03/13   [facility]  . [place]  . CHOLECYSTECTOMY  03/1989  . COLONOSCOPY  03/21/2011   Normal.  [facility].  [name] KNEE SURGERY  2006  . LAPAROSCOPIC GASTRIC BANDING  05/2009  . TONSILLECTOMY AND ADENOIDECTOMY  1974  . TUBAL LIGATION      Family History  Problem Relation Age of Onset  . Diabetes Mother   . Heart disease Mother 57       CHF, CAD  . Other Mother        muscle disease  . Hypertension Mother   . Hyperlipidemia Mother   . Arthritis Mother        OA hip L s/p THR  . Diabetes Father   . Heart disease Father        AMI/CABG/valve replacement age 45.  . Stroke Father   . Diabetes Brother   . Heart disease Brother        stent age 30.  . Fibromyalgia Daughter   . Crohn's disease Daughter   . Hypertension Son   . Alzheimer's disease Maternal Grandmother   . Emphysema Maternal Grandfather   . Heart disease Paternal Grandmother   . Breast cancer Unknown        2 Aunts    Allergies  Allergen Reactions  . Amoxicillin Shortness Of Breath and Rash  . Levofloxacin Shortness Of Breath and Rash  . Meloxicam Hives and Rash  . Vancomycin Anaphylaxis  . Sulfa Antibiotics Rash  . Ace Inhibitors Cough  . Codeine   . Meperidine Hcl   . Oxycodone-Acetaminophen   . Oxycodone-Acetaminophen   . Penicillins Swelling and Rash  . Tequin [Gatifloxacin] Rash    Current Outpatient Medications on File Prior to Visit  Medication Sig Dispense Refill  . albuterol (PROVENTIL HFA;VENTOLIN HFA) 108 (90 BASE) MCG/ACT inhaler Inhale 2 puffs into the lungs every 6 (six) hours as needed for wheezing. 1 Inhaler 1  . atorvastatin (LIPITOR) 40 MG tablet TAKE 1  TABLET BY MOUTH DAILY 90 tablet 3  . azelastine (ASTELIN) 0.1 % nasal spray Place 2 sprays into both nostrils 2 (two) times daily. Use in each nostril as  directed 30 mL 11  . beclomethasone (QVAR) 80 MCG/ACT inhaler Inhale 2 puffs into the lungs every 12 (twelve) hours as needed. 1 Inhaler 11  . CALCIUM PO Take by mouth 3 (three) times daily.      . Cetirizine HCl (ZYRTEC PO) Take by mouth daily.      . cyclobenzaprine (FLEXERIL) 5 MG tablet TAKE 1 TABLET BY MOUTH 3 TIMES A DAY AS NEEDED FOR MUSCLE SPASMS. 30 tablet 0  . fenofibrate (TRICOR) 145 MG tablet Take 1 tablet (145 mg total) by mouth daily. 90 tablet 1  . Fexofenadine HCl (ALLEGRA PO) Take by mouth.      [name] FLUoxetine (PROZAC) 20 MG capsule Take 1 capsule (20 mg total) by mouth daily. 180 capsule 1  . fluticasone (FLONASE) 50 MCG/ACT nasal spray Place 2 sprays into both nostrils daily. 16 g 11  . gabapentin (NEURONTIN) 300 MG capsule TAKE 1 CAPSULE BY MOUTH THREE TIMES A DAY 270 capsule 3  . glipiZIDE-metformin (METAGLIP) 2.5-500 MG tablet TAKE TWO (2) TABLETS BY MOUTH 2 TIMES DAILY BEFORE A MEAL. 360 tablet 1  . hydrochlorothiazide (HYDRODIURIL) 25 MG tablet Take 0.5 tablets (12.5 mg total) by mouth daily. 45 tablet 3  . HYDROcodone-acetaminophen (NORCO/VICODIN) 5-325 MG tablet     . insulin aspart (NOVOLOG FLEXPEN) 100 UNIT/ML FlexPen Inject 18 Units into the skin 3 (three) times daily with meals. 15 mL 5  . Insulin Detemir (LEVEMIR) 100 UNIT/ML Pen Inject 35 units in the morning and 35 units at night. 30 mL 5  . Insulin Pen Needle (UNIFINE PENTIPS) 32G X 4 MM MISC Use as directed for insulin injection daily. 100 each 11  . JANUVIA 100 MG tablet TAKE 1 TABLET BY MOUTH DAILY 90 tablet 1  . ketoconazole (NIZORAL) 2 % cream Apply 1 application topically 2 (two) times daily as needed for irritation. 60 g 0  . levalbuterol (XOPENEX) 1.25 MG/3ML nebulizer solution Take 1.25 mg by nebulization daily as needed.    [name] levocetirizine (XYZAL) 5 MG  tablet Take 1 tablet (5 mg total) by mouth every evening. 90 tablet 0  . losartan (COZAAR) 100 MG tablet TAKE 1/2 TABLET BY MOUTH EVERY DAY. 90 tablet 1  . meloxicam (MOBIC) 15 MG tablet     . Multiple Vitamin (MULTIVITAMIN PO) Take by mouth daily.      . naproxen (NAPROSYN) 500 MG tablet Take 1 tablet (500 mg total) by mouth 2 (two) times daily with a meal. 30 tablet 0  . Nystatin (NYAMYC) 100000 UNIT/GM POWD APPLY TO AFFECTED AREA TWICE A DAY 60 g 5  . nystatin-triamcinolone (MYCOLOG II) cream Apply topically 2 (two) times daily. 60 g 3  . ondansetron (ZOFRAN-ODT) 4 MG disintegrating tablet TAKE 1 TABLET BY MOUTH EVERY 8 HOURS AS NEEDED FOR NAUSEA AS DIRECTED. 20 tablet 0   Current Facility-Administered Medications on File Prior to Visit  Medication Dose Route Frequency Provider Last Rate Last Dose  . hepatitis B vac recombinant (RECOMBIVAX) injection 5 mcg  0.5 mL Intramuscular Once Wardell Honour, MD        BP 120/76   Pulse (!) 109   Temp 98.8 F (37.1 C) (Oral)   Ht 5\' 7"  (1.702 m)   Wt 286 lb 12 oz (130.1 kg)   SpO2 97%   BMI 44.91 kg/m    Objective:   Physical Exam  Constitutional: She appears well-nourished.  Neck: Neck supple.  Cardiovascular: Normal rate.  Pulses:      Dorsalis  pedis pulses are 2+ on the right side, and 2+ on the left side.       Posterior tibial pulses are 2+ on the right side, and 2+ on the left side.  Pulmonary/Chest: Effort normal.  Skin: Skin is warm and dry.          Assessment & Plan:

## 2017-05-28 NOTE — Assessment & Plan Note (Signed)
Last lipid panel above goal, repeat panel pending today. Continue statin and fenofibrate.

## 2017-05-28 NOTE — Patient Instructions (Signed)
Stop by the lab prior to leaving today. I will notify you of your results once received.   Continue following with physical therapy. Follow up with the neurosurgeon as scheduled.   You MUST start checking your blood sugars three times daily: before breakfast, before lunch, before dinner. Please call me if you continuously get readings at or above 200.   Continue to refrain from soda consumption. Increase vegetables, fruit, whole grains, water.   Please schedule a physical with me in 3 months. You may also schedule a lab only appointment 3-4 days prior. We will discuss your lab results in detail during your physical.  It was a pleasure to see you today!

## 2017-06-04 ENCOUNTER — Encounter: Payer: Self-pay | Admitting: Primary Care

## 2017-06-04 DIAGNOSIS — E119 Type 2 diabetes mellitus without complications: Secondary | ICD-10-CM

## 2017-06-04 DIAGNOSIS — Z794 Long term (current) use of insulin: Principal | ICD-10-CM

## 2017-06-04 MED ORDER — DULAGLUTIDE 0.75 MG/0.5ML ~~LOC~~ SOAJ: SUBCUTANEOUS | 0 refills | Status: DC

## 2017-06-11 ENCOUNTER — Other Ambulatory Visit: Payer: Self-pay | Admitting: Primary Care

## 2017-06-11 ENCOUNTER — Encounter: Payer: Self-pay | Admitting: Primary Care

## 2017-06-11 DIAGNOSIS — I10 Essential (primary) hypertension: Secondary | ICD-10-CM

## 2017-06-11 MED ORDER — ONETOUCH ULTRASOFT LANCETS MISC: 5 refills | Status: DC

## 2017-06-11 MED ORDER — GLUCOSE BLOOD VI STRP: ORAL_STRIP | 5 refills | Status: DC

## 2017-06-11 MED ORDER — ONETOUCH VERIO W/DEVICE KIT
1.0000 | PACK | Freq: Every day | 0 refills | Status: DC
Start: 2017-06-11 — End: 2017-06-12

## 2017-06-12 MED ORDER — GLUCOSE BLOOD VI STRP: ORAL_STRIP | 5 refills | Status: DC

## 2017-06-12 MED ORDER — ONETOUCH ULTRA MINI W/DEVICE KIT: PACK | 0 refills | Status: DC

## 2017-06-14 ENCOUNTER — Encounter: Payer: Self-pay | Admitting: Primary Care

## 2017-06-14 ENCOUNTER — Other Ambulatory Visit: Payer: Self-pay | Admitting: Primary Care

## 2017-06-14 DIAGNOSIS — Z794 Long term (current) use of insulin: Principal | ICD-10-CM

## 2017-06-14 DIAGNOSIS — E119 Type 2 diabetes mellitus without complications: Secondary | ICD-10-CM

## 2017-06-15 ENCOUNTER — Encounter: Payer: Self-pay | Admitting: Primary Care

## 2017-06-15 ENCOUNTER — Other Ambulatory Visit: Payer: Self-pay | Admitting: Neurosurgery

## 2017-06-15 DIAGNOSIS — M5416 Radiculopathy, lumbar region: Secondary | ICD-10-CM

## 2017-06-17 ENCOUNTER — Other Ambulatory Visit: Payer: Self-pay | Admitting: Primary Care

## 2017-06-26 ENCOUNTER — Ambulatory Visit
Admission: RE | Admit: 2017-06-26 | Discharge: 2017-06-26 | Disposition: A | Discharge status: Home / Self Care | Payer: BC Managed Care – PPO | Source: Ambulatory Visit | Attending: Neurosurgery | Admitting: Neurosurgery

## 2017-06-26 DIAGNOSIS — M5416 Radiculopathy, lumbar region: Secondary | ICD-10-CM

## 2017-06-26 MED ORDER — GADOBENATE DIMEGLUMINE 529 MG/ML IV SOLN
20.0000 mL | Freq: Once | INTRAVENOUS | Status: AC | PRN
  Administered 2017-06-26: 20 mL via INTRAVENOUS

## 2017-07-09 ENCOUNTER — Encounter: Payer: Self-pay | Admitting: Primary Care

## 2017-07-09 DIAGNOSIS — R29898 Other symptoms and signs involving the musculoskeletal system: Secondary | ICD-10-CM

## 2017-07-16 ENCOUNTER — Other Ambulatory Visit: Payer: Self-pay | Admitting: Primary Care

## 2017-07-16 DIAGNOSIS — J302 Other seasonal allergic rhinitis: Secondary | ICD-10-CM

## 2017-07-20 ENCOUNTER — Emergency Department (HOSPITAL_COMMUNITY): Payer: BC Managed Care – PPO

## 2017-07-20 ENCOUNTER — Other Ambulatory Visit: Payer: Self-pay

## 2017-07-20 ENCOUNTER — Ambulatory Visit: Payer: Self-pay | Admitting: *Deleted

## 2017-07-20 ENCOUNTER — Encounter (HOSPITAL_COMMUNITY): Payer: Self-pay | Admitting: Emergency Medicine

## 2017-07-20 ENCOUNTER — Observation Stay (HOSPITAL_COMMUNITY)
Admission: EM | Admit: 2017-07-20 | Discharge: 2017-07-25 | Disposition: A | Discharge status: Skilled Nursing Facility | Payer: BC Managed Care – PPO | Attending: Internal Medicine | Admitting: Internal Medicine

## 2017-07-20 DIAGNOSIS — J45909 Unspecified asthma, uncomplicated: Secondary | ICD-10-CM | POA: Diagnosis present

## 2017-07-20 DIAGNOSIS — Z880 Allergy status to penicillin: Secondary | ICD-10-CM | POA: Insufficient documentation

## 2017-07-20 DIAGNOSIS — R296 Repeated falls: Secondary | ICD-10-CM

## 2017-07-20 DIAGNOSIS — E1169 Type 2 diabetes mellitus with other specified complication: Secondary | ICD-10-CM

## 2017-07-20 DIAGNOSIS — Z6841 Body Mass Index (BMI) 40.0 and over, adult: Secondary | ICD-10-CM | POA: Insufficient documentation

## 2017-07-20 DIAGNOSIS — G43909 Migraine, unspecified, not intractable, without status migrainosus: Secondary | ICD-10-CM

## 2017-07-20 DIAGNOSIS — Z794 Long term (current) use of insulin: Secondary | ICD-10-CM | POA: Insufficient documentation

## 2017-07-20 DIAGNOSIS — S82831A Other fracture of upper and lower end of right fibula, initial encounter for closed fracture: Principal | ICD-10-CM | POA: Diagnosis present

## 2017-07-20 DIAGNOSIS — E785 Hyperlipidemia, unspecified: Secondary | ICD-10-CM | POA: Diagnosis present

## 2017-07-20 DIAGNOSIS — Z882 Allergy status to sulfonamides status: Secondary | ICD-10-CM | POA: Diagnosis not present

## 2017-07-20 DIAGNOSIS — I10 Essential (primary) hypertension: Secondary | ICD-10-CM | POA: Diagnosis present

## 2017-07-20 DIAGNOSIS — R260 Ataxic gait: Secondary | ICD-10-CM | POA: Diagnosis not present

## 2017-07-20 DIAGNOSIS — F419 Anxiety disorder, unspecified: Secondary | ICD-10-CM | POA: Diagnosis not present

## 2017-07-20 DIAGNOSIS — S82441A Displaced spiral fracture of shaft of right fibula, initial encounter for closed fracture: Secondary | ICD-10-CM | POA: Insufficient documentation

## 2017-07-20 DIAGNOSIS — Z885 Allergy status to narcotic agent status: Secondary | ICD-10-CM | POA: Diagnosis not present

## 2017-07-20 DIAGNOSIS — G4733 Obstructive sleep apnea (adult) (pediatric): Secondary | ICD-10-CM | POA: Insufficient documentation

## 2017-07-20 DIAGNOSIS — M1990 Unspecified osteoarthritis, unspecified site: Secondary | ICD-10-CM | POA: Diagnosis not present

## 2017-07-20 DIAGNOSIS — K219 Gastro-esophageal reflux disease without esophagitis: Secondary | ICD-10-CM | POA: Diagnosis not present

## 2017-07-20 DIAGNOSIS — Z881 Allergy status to other antibiotic agents status: Secondary | ICD-10-CM | POA: Diagnosis not present

## 2017-07-20 DIAGNOSIS — Z79899 Other long term (current) drug therapy: Secondary | ICD-10-CM | POA: Insufficient documentation

## 2017-07-20 DIAGNOSIS — R29898 Other symptoms and signs involving the musculoskeletal system: Secondary | ICD-10-CM

## 2017-07-20 DIAGNOSIS — R531 Weakness: Secondary | ICD-10-CM | POA: Insufficient documentation

## 2017-07-20 DIAGNOSIS — Z91030 Bee allergy status: Secondary | ICD-10-CM | POA: Insufficient documentation

## 2017-07-20 DIAGNOSIS — Z8614 Personal history of Methicillin resistant Staphylococcus aureus infection: Secondary | ICD-10-CM | POA: Insufficient documentation

## 2017-07-20 DIAGNOSIS — Z9884 Bariatric surgery status: Secondary | ICD-10-CM | POA: Insufficient documentation

## 2017-07-20 DIAGNOSIS — Z888 Allergy status to other drugs, medicaments and biological substances status: Secondary | ICD-10-CM | POA: Insufficient documentation

## 2017-07-20 DIAGNOSIS — W19XXXA Unspecified fall, initial encounter: Secondary | ICD-10-CM | POA: Insufficient documentation

## 2017-07-20 DIAGNOSIS — M48061 Spinal stenosis, lumbar region without neurogenic claudication: Secondary | ICD-10-CM | POA: Diagnosis present

## 2017-07-20 DIAGNOSIS — G894 Chronic pain syndrome: Secondary | ICD-10-CM

## 2017-07-20 DIAGNOSIS — R262 Difficulty in walking, not elsewhere classified: Secondary | ICD-10-CM | POA: Diagnosis present

## 2017-07-20 DIAGNOSIS — E1140 Type 2 diabetes mellitus with diabetic neuropathy, unspecified: Secondary | ICD-10-CM

## 2017-07-20 DIAGNOSIS — Z9181 History of falling: Secondary | ICD-10-CM | POA: Insufficient documentation

## 2017-07-20 DIAGNOSIS — M21379 Foot drop, unspecified foot: Secondary | ICD-10-CM | POA: Diagnosis not present

## 2017-07-20 DIAGNOSIS — E1165 Type 2 diabetes mellitus with hyperglycemia: Secondary | ICD-10-CM

## 2017-07-20 DIAGNOSIS — M5116 Intervertebral disc disorders with radiculopathy, lumbar region: Secondary | ICD-10-CM | POA: Diagnosis present

## 2017-07-20 DIAGNOSIS — E669 Obesity, unspecified: Secondary | ICD-10-CM

## 2017-07-20 LAB — CBC WITH DIFFERENTIAL/PLATELET
Basophils Absolute: 0.1 10*3/uL (ref 0.0–0.1)
Basophils Relative: 0 %
EOS PCT: 1 %
Eosinophils Absolute: 0.1 10*3/uL (ref 0.0–0.7)
HCT: 42.1 % (ref 36.0–46.0)
Hemoglobin: 13.9 g/dL (ref 12.0–15.0)
LYMPHS ABS: 4.2 10*3/uL — AB (ref 0.7–4.0)
LYMPHS PCT: 36 %
MCH: 29.4 pg (ref 26.0–34.0)
MCHC: 33 g/dL (ref 30.0–36.0)
MCV: 89 fL (ref 78.0–100.0)
MONO ABS: 1 10*3/uL (ref 0.1–1.0)
Monocytes Relative: 9 %
Neutro Abs: 6.5 10*3/uL (ref 1.7–7.7)
Neutrophils Relative %: 54 %
PLATELETS: 310 10*3/uL (ref 150–400)
RBC: 4.73 MIL/uL (ref 3.87–5.11)
RDW: 14.3 % (ref 11.5–15.5)
WBC: 11.8 10*3/uL — AB (ref 4.0–10.5)

## 2017-07-20 LAB — BASIC METABOLIC PANEL
Anion gap: 13 (ref 5–15)
BUN: 17 mg/dL (ref 6–20)
CO2: 23 mmol/L (ref 22–32)
Calcium: 9.5 mg/dL (ref 8.9–10.3)
Chloride: 105 mmol/L (ref 101–111)
Creatinine, Ser: 0.66 mg/dL (ref 0.44–1.00)
GFR calc Af Amer: >60 mL/min (ref >60)
GLUCOSE: 241 mg/dL — AB (ref 65–99)
POTASSIUM: 4.1 mmol/L (ref 3.5–5.1)
Sodium: 141 mmol/L (ref 135–145)

## 2017-07-20 MED ORDER — MORPHINE SULFATE (PF) 4 MG/ML IV SOLN
4.0000 mg | Freq: Once | INTRAVENOUS | Status: AC
  Administered 2017-07-20: 4 mg via INTRAVENOUS
  Filled 2017-07-20: qty 1

## 2017-07-20 MED ORDER — HYDROCODONE-ACETAMINOPHEN 5-325 MG PO TABS
1.0000 | ORAL_TABLET | Freq: Once | ORAL | Status: AC
  Administered 2017-07-20: 1 via ORAL
  Filled 2017-07-20: qty 1

## 2017-07-20 NOTE — ED Notes (Signed)
Bed: [facility] Expected date:  Expected time:  Means of arrival:  Comments: 

## 2017-07-20 NOTE — Telephone Encounter (Signed)
Agree that patient needs ED evaluation. She is currently at [facility] ED.

## 2017-07-20 NOTE — ED Notes (Signed)
MRI transport here to get patient

## 2017-07-20 NOTE — ED Notes (Signed)
[facility] was notified of pt's transfer to [place] [facility] for MRI.

## 2017-07-20 NOTE — ED Notes (Signed)
[facility] was notified of pt's need of transportation to [facility]. 

## 2017-07-20 NOTE — ED Notes (Signed)
ED TO INPATIENT HANDOFF REPORT  Name/Age/Gender [name] 52 y.o. female  Code Status   Home/SNF/Other Home  Chief Complaint fall / knee pain   Level of Care/Admitting Diagnosis ED Disposition    ED Disposition Condition Comment   Transfer to Another Facility  The patient appears reasonably stabilized for transfer considering the current resources, flow, and capabilities available in the ED at this time, and I doubt any other [facility] requiring further screening and/or treatment in the ED prior to transfer is p resent.       Medical History Past Medical History:  Diagnosis Date  . Allergy   . Anemia   . Anxiety   . Arthritis   . Asthma   . Colon polyp   . Diabetes mellitus   . Edema   . Fatigue   . Hypertension   . IBS (irritable bowel syndrome)   . Lump in female breast   . Methicillin resistant Staphylococcus aureus in conditions classified elsewhere and of unspecified site   . Migraines   . Morbid obesity ([place])   . Night sweats   . Nonspecific abnormal results of thyroid function study   . Other B-complex deficiencies   . Paresthesias   . Pure hypercholesterolemia   . Reflux   . Sacral fracture ([place])   . Sinus problem   . Symptomatic states associated with artificial menopause   . Thyroid disease   . Type II or unspecified type diabetes mellitus without mention of complication, not stated as uncontrolled   . Unspecified sleep apnea     Allergies Allergies  Allergen Reactions  . Amoxicillin Shortness Of Breath and Rash  . Bee Venom Anaphylaxis  . Levofloxacin Shortness Of Breath and Rash  . Meloxicam Hives and Rash  . Vancomycin Anaphylaxis  . Sulfa Antibiotics Rash  . Ace Inhibitors Cough  . Codeine   . Meperidine Hcl   . Oxycodone-Acetaminophen   . Penicillins Swelling and Rash    Has patient had a PCN reaction causing immediate rash, facial/tongue/throat swelling, SOB or lightheadedness with hypotension: Yes Has patient had a PCN reaction  causing severe rash involving mucus membranes or skin necrosis: Yes Has patient had a PCN reaction that required hospitalization: No Has patient had a PCN reaction occurring within the last 10 years: No If all of the above answers are "NO", then may proceed with Cephalosporin use.   Flo Shanks [Gatifloxacin] Rash    IV Location/Drains/Wounds Patient Lines/Drains/Airways Status   Active Line/Drains/Airways    Name:   Placement date:   Placement time:   Site:   Days:   Peripheral IV 07/20/17 Right Antecubital   07/20/17    2101    Antecubital   less than 1          Labs/Imaging Results for orders placed or performed during the hospital encounter of 07/20/17 (from the past 48 hour(s))  CBC with Differential     Status: Abnormal   Collection Time: 07/20/17  7:10 PM  Result Value Ref Range   WBC 11.8 (H) 4.0 - 10.5 K/uL   RBC 4.73 3.87 - 5.11 MIL/uL   Hemoglobin 13.9 12.0 - 15.0 g/dL   HCT 42.1 36.0 - 46.0 %   MCV 89.0 78.0 - 100.0 fL   MCH 29.4 26.0 - 34.0 pg   MCHC 33.0 30.0 - 36.0 g/dL   RDW 14.3 11.5 - 15.5 %   Platelets 310 150 - 400 K/uL   Neutrophils Relative % 54 %  Neutro Abs 6.5 1.7 - 7.7 K/uL   Lymphocytes Relative 36 %   Lymphs Abs 4.2 (H) 0.7 - 4.0 K/uL   Monocytes Relative 9 %   Monocytes Absolute 1.0 0.1 - 1.0 K/uL   Eosinophils Relative 1 %   Eosinophils Absolute 0.1 0.0 - 0.7 K/uL   Basophils Relative 0 %   Basophils Absolute 0.1 0.0 - 0.1 K/uL    Comment: Performed at [facility], [place] [address], [place], [place] [address]  Basic metabolic panel     Status: Abnormal   Collection Time: 07/20/17  7:10 PM  Result Value Ref Range   Sodium 141 135 - 145 mmol/L   Potassium 4.1 3.5 - 5.1 mmol/L   Chloride 105 101 - 111 mmol/L   CO2 23 22 - 32 mmol/L   Glucose, Bld 241 (H) 65 - 99 mg/dL   BUN 17 6 - 20 mg/dL   Creatinine, Ser 0.66 0.44 - 1.00 mg/dL   Calcium 9.5 8.9 - 10.3 mg/dL   GFR calc non Af Amer >60 >60 mL/min   GFR calc Af Amer >60  >60 mL/min    Comment: (NOTE) The eGFR has been calculated using the CKD EPI equation. This calculation has not been validated in all clinical situations. eGFR's persistently <60 mL/min signify possible Chronic Kidney Disease.    Anion gap 13 5 - 15    Comment: Performed at [facility], [place] [address], [place], [place] [address]   Dg Lumbar Spine Complete  Result Date: 07/20/2017 CLINICAL DATA:  Multiple falls today and over the last two weeks - pt had lumbar surgery 01.2019 - pt states inability to move right leg and little movement of left EXAM: LUMBAR SPINE - COMPLETE 4+ VIEW COMPARISON:  Lumbar MR 06/26/2017 FINDINGS: Normal alignment of the vertebral bodies. No loss vertebral body height or disc height. No subluxation. No pars defect. Gastric banding device noted. IMPRESSION: No acute findings lumbar spine by radiography. Electronically Signed   By: Suzy Bouchard M.D.   On: 07/20/2017 19:18   Dg Ankle Complete Right  Result Date: 07/20/2017 CLINICAL DATA:  Right ankle pain after multiple falls. EXAM: RIGHT ANKLE - COMPLETE 3+ VIEW COMPARISON:  None. FINDINGS: There is no evidence of fracture, dislocation, or joint effusion. There is no evidence of arthropathy or other focal bone abnormality. Soft tissues are unremarkable. IMPRESSION: Normal right ankle. Electronically Signed   By: Marijo Conception, M.D.   On: 07/20/2017 15:20   Dg Knee Complete 4 Views Left  Result Date: 07/20/2017 CLINICAL DATA:  Left knee pain after multiple falls. EXAM: LEFT KNEE - COMPLETE 4+ VIEW COMPARISON:  None. FINDINGS: No evidence of fracture, dislocation, or joint effusion. No evidence of arthropathy or other focal bone abnormality. Soft tissues are unremarkable. IMPRESSION: Normal left knee. Electronically Signed   By: Marijo Conception, M.D.   On: 07/20/2017 15:22   Dg Knee Complete 4 Views Right  Result Date: 07/20/2017 CLINICAL DATA:  Right knee pain after multiple falls. EXAM: RIGHT KNEE  - COMPLETE 4+ VIEW COMPARISON:  None. FINDINGS: No joint effusion is noted. Mildly displaced proximal right fibular fracture is noted. No fracture seen involving the femur or tibia. Joint spaces are intact. IMPRESSION: Mildly displaced proximal right fibular fracture. Electronically Signed   By: Marijo Conception, M.D.   On: 07/20/2017 15:19   Dg Hips Bilat W Or Wo Pelvis 2 Views  Result Date: 07/20/2017 CLINICAL DATA:  52 year old female  with multiple recent falls. New onset lower extremity weakness. EXAM: DG HIP (WITH OR WITHOUT PELVIS) 2V BILAT COMPARISON:  Pelvis radiograph 12/29/2016. FINDINGS: Bone mineralization is within normal limits. The femoral heads are normally located. The pelvis appears intact. The SI joints today and sacral ala appear within normal limits. Negative visible bowel gas pattern. The proximal left femur is intact. The proximal right femur is intact. IMPRESSION: No acute fracture or dislocation identified about the bilateral hips or pelvis. Electronically Signed   By: Genevie Ann M.D.   On: 07/20/2017 19:18    Pending Labs Unresulted Labs (From admission, onward)   None      Vitals/Pain Today's Vitals   07/20/17 1359 07/20/17 1910 07/20/17 2004 07/20/17 2006  BP: (!) 172/94 (!) 147/70    Pulse: (!) 113 (!) 107    Resp: 18 18    Temp: 98.5 F (36.9 C)     TempSrc: Oral     SpO2: 97% 98%    PainSc:   8  8     Isolation Precautions No active isolations  Medications Medications  HYDROcodone-acetaminophen (NORCO/VICODIN) 5-325 MG per tablet 1 tablet (has no administration in time range)  HYDROcodone-acetaminophen (NORCO/VICODIN) 5-325 MG per tablet 1 tablet (1 tablet Oral Given 07/20/17 1715)    Mobility walks

## 2017-07-20 NOTE — ED Notes (Signed)
[facility] here to transport pt to [facility]. 

## 2017-07-20 NOTE — ED Provider Notes (Addendum)
[place] DEPT Provider Note   CSN: [id number] Arrival date & time: 07/20/17  1321     History   Chief Complaint Chief Complaint  Patient presents with  . Fall  . Knee Pain    HPI RIKI BERNINGER is a 52 y.o. female with history of hyperlipidemia, type 2 diabetes on insulin, hypertension, morbid obesity, chronic back pain is here for increased weakness to left lower extremity since this morning.  LLE weakness has been progressive for the last 6 weeks however weakness acutely worsened, today she has fallen 4 times in the last 4 hours.  States every time she stands up and puts weight on the leg it "gives out", she can't hold her weight up on that leg. She has collapsed to the floor. Also unable to lift leg off the bed or move it like normal. Reports long history of chronic back pain and RLE weakness, on pain management status post lumbar surgery with neurosurgery January 2019.  RLE weakness onset since pelvic fracture September 2018, weakness slightly improved after neurosurgery. No changes to quality or severity of back pain. She typically has little to no function or strength in RLE and this is unchanged. Since falls today she has R knee pain, R ankle pain, L knee pain.   No fevers, chills, abdominal pain, n/v/d/c, saddle anesthesia, b/b incontinence or retentino.   HPI  Past Medical History:  Diagnosis Date  . Allergy   . Anemia   . Anxiety   . Arthritis   . Asthma   . Colon polyp   . Diabetes mellitus   . Edema   . Fatigue   . Hypertension   . IBS (irritable bowel syndrome)   . Lump in female breast   . Methicillin resistant Staphylococcus aureus in conditions classified elsewhere and of unspecified site   . Migraines   . Morbid obesity ([place])   . Night sweats   . Nonspecific abnormal results of thyroid function study   . Other B-complex deficiencies   . Paresthesias   . Pure hypercholesterolemia   . Reflux   . Sacral fracture ([place])   .  Sinus problem   . Symptomatic states associated with artificial menopause   . Thyroid disease   . Type II or unspecified type diabetes mellitus without mention of complication, not stated as uncontrolled   . Unspecified sleep apnea     Patient Active Problem List   Diagnosis Date Noted  . Weakness of right lower extremity 05/28/2017  . Right lower quadrant abdominal pain 08/22/2016  . Abnormal TSH 01/10/2016  . Morbid obesity ([place]) 01/12/2014  . Routine general medical examination at a health care facility 10/28/2012  . GERD (gastroesophageal reflux disease) 10/28/2012  . Anxiety 12/20/2011  . Allergic rhinitis 12/20/2011  . Asthma 12/20/2011  . Lapband APL with HH repair 03/31/2011  . Type 2 diabetes mellitus ([place]) 06/30/2007  . Hyperlipidemia 06/30/2007  . Essential hypertension 06/27/2007    Past Surgical History:  Procedure Laterality Date  . abcess removal  1997   MRSA  . ABDOMINAL HYSTERECTOMY  1997   complete in 1997, partial was in 1995  . carpal tunnel release r  07/03/13   [place]  . [place]  . CHOLECYSTECTOMY  03/1989  . COLONOSCOPY  03/21/2011   Normal.  [facility].  [name] KNEE SURGERY  2006  . LAPAROSCOPIC GASTRIC BANDING  05/2009  . TONSILLECTOMY AND ADENOIDECTOMY  1974  . TUBAL LIGATION  OB History   None      Home Medications    Prior to Admission medications   Medication Sig Start Date End Date Taking? Authorizing Provider  acetaminophen (TYLENOL) 500 MG tablet Take 500 mg by mouth daily as needed for moderate pain.   Yes [provider]  atorvastatin (LIPITOR) 40 MG tablet TAKE 1 TABLET BY MOUTH DAILY 10/27/16  Yes Pleas Koch, NP  cyclobenzaprine (FLEXERIL) 10 MG tablet Take 10 mg by mouth 3 (three) times daily. 07/03/17  Yes [provider]  Dulaglutide (TRULICITY) 3.00 TM/2.2QJ SOPN Inject 0.5 ml once weekly for diabetes. 06/04/17  Yes Pleas Koch, NP  Fexofenadine HCl (ALLEGRA PO) Take by  mouth.     Yes [provider]  FLUoxetine (PROZAC) 20 MG capsule Take 1 capsule (20 mg total) by mouth daily. 02/15/17  Yes Pleas Koch, NP  fluticasone (FLONASE) 50 MCG/ACT nasal spray Place 2 sprays into both nostrils daily. 01/12/14  Yes Wardell Honour, MD  gabapentin (NEURONTIN) 300 MG capsule TAKE 1 CAPSULE BY MOUTH THREE TIMES A DAY Patient taking differently: TAKE 1 CAPSULE (300 mg)  BY MOUTH THREE TIMES A DAY 09/06/16  Yes Pleas Koch, NP  glipiZIDE-metformin (METAGLIP) 2.5-500 MG tablet TAKE TWO (2) TABLETS BY MOUTH 2 TIMES DAILY BEFORE A MEAL. 04/09/17  Yes Pleas Koch, NP  hydrochlorothiazide (HYDRODIURIL) 25 MG tablet TAKE 1/2 TABLET BY MOUTH EVERY DAY. Patient taking differently: TAKE 1/2 TABLET  (12.5 mg) BY MOUTH EVERY DAY. 06/11/17  Yes Pleas Koch, NP  HYDROcodone-acetaminophen (NORCO/VICODIN) 5-325 MG tablet Take 1 tablet by mouth 2 (two) times daily as needed for moderate pain.  12/25/16  Yes [provider]  insulin aspart (NOVOLOG FLEXPEN) 100 UNIT/ML FlexPen Inject 18 Units into the skin 3 (three) times daily with meals. 12/05/16  Yes Pleas Koch, NP  LEVEMIR FLEXTOUCH 100 UNIT/ML Pen INJECT 35 UNITS EVERY MORNING AND 35 UNITS IN THE EVENING AS DIRECTED Patient taking differently: INJECT 36 UNITS EVERY MORNING AND 36 UNITS IN THE EVENING AS DIRECTED 06/14/17  Yes Pleas Koch, NP  levocetirizine (XYZAL) 5 MG tablet TAKE ONE TABLET BY MOUTH EVERY EVENING Patient taking differently: TAKE ONE TABLET (5 mg)  BY MOUTH EVERY EVENING 07/16/17  Yes Pleas Koch, NP  losartan (COZAAR) 100 MG tablet TAKE 1/2 TABLET BY MOUTH EVERY DAY. Patient taking differently: TAKE 1/2 TABLET (50 mg) BY MOUTH EVERY DAY. 03/26/17  Yes Pleas Koch, NP  Multiple Vitamin (MULTIVITAMIN PO) Take by mouth daily.     Yes [provider]  naproxen (NAPROSYN) 500 MG tablet Take 1 tablet (500 mg total) by mouth 2 (two) times daily with a  meal. 02/28/17  Yes Pleas Koch, NP  ondansetron (ZOFRAN-ODT) 4 MG disintegrating tablet TAKE 1 TABLET BY MOUTH EVERY 8 HOURS AS NEEDED FOR NAUSEA AS DIRECTED. 02/06/17  Yes Pleas Koch, NP  albuterol (PROVENTIL HFA;VENTOLIN HFA) 108 (90 BASE) MCG/ACT inhaler Inhale 2 puffs into the lungs every 6 (six) hours as needed for wheezing. Patient not taking: Reported on 07/20/2017 10/28/12   Wardell Honour, MD  azelastine (ASTELIN) 0.1 % nasal spray Place 2 sprays into both nostrils 2 (two) times daily. Use in each nostril as directed Patient not taking: Reported on 07/20/2017 05/27/15   Pleas Koch, NP  beclomethasone (QVAR) 80 MCG/ACT inhaler Inhale 2 puffs into the lungs every 12 (twelve) hours as needed. Patient not taking: Reported on 07/20/2017  10/28/12   Wardell Honour, MD  Blood Glucose Monitoring Suppl (ONE TOUCH ULTRA MINI) w/Device KIT Use as instructed to test blood sugar 5 times daily. 06/12/17   Pleas Koch, NP  Continuous Blood Gluc Sensor (FREESTYLE LIBRE 14 DAY SENSOR) MISC 1 each by Does not apply route every 14 (fourteen) days. 05/28/17   Pleas Koch, NP  cyclobenzaprine (FLEXERIL) 5 MG tablet TAKE 1 TABLET BY MOUTH 3 TIMES A DAY AS NEEDED FOR MUSCLE SPASMS. Patient not taking: Reported on 07/20/2017 02/28/17   Pleas Koch, NP  fenofibrate (TRICOR) 145 MG tablet Take 1 tablet (145 mg total) by mouth daily. Patient not taking: Reported on 07/20/2017 09/26/16   Pleas Koch, NP  glucose blood test strip ONE TOUCH ULTRA MINI Use as instructed to test blood sugar 5 times daily. 06/12/17   Pleas Koch, NP  Insulin Pen Needle (UNIFINE PENTIPS) 32G X 4 MM MISC Use as directed for insulin injection daily. 08/22/16   Pleas Koch, NP  ketoconazole (NIZORAL) 2 % cream Apply 1 application topically 2 (two) times daily as needed for irritation. Patient not taking: Reported on 07/20/2017 09/08/15   Harrison Mons, PA-C  Lancets [facility] ULTRASOFT) lancets  Use as instructed to test blood sugar 3 times daily 06/11/17   Pleas Koch, NP  Nystatin [facility]) 100000 UNIT/GM POWD APPLY TO AFFECTED AREA TWICE A DAY Patient not taking: Reported on 07/20/2017 07/12/15   Pleas Koch, NP  nystatin-triamcinolone (MYCOLOG II) cream Apply topically 2 (two) times daily. Patient not taking: Reported on 07/20/2017 07/24/12   Wardell Honour, MD    Family History Family History  Problem Relation Age of Onset  . Diabetes Mother   . Heart disease Mother 17       CHF, CAD  . Other Mother        muscle disease  . Hypertension Mother   . Hyperlipidemia Mother   . Arthritis Mother        OA hip L s/p THR  . Diabetes Father   . Heart disease Father        AMI/CABG/valve replacement age 57.  . Stroke Father   . Diabetes Brother   . Heart disease Brother        stent age 50.  . Fibromyalgia Daughter   . Crohn's disease Daughter   . Hypertension Son   . Alzheimer's disease Maternal Grandmother   . Emphysema Maternal Grandfather   . Heart disease Paternal Grandmother   . Breast cancer Unknown        2 Aunts    Social History Social History   Tobacco Use  . Smoking status: Never Smoker  . Smokeless tobacco: Never Used  Substance Use Topics  . Alcohol use: Yes    Alcohol/week: 0.0 oz    Comment: rarely - once per week at most  . Drug use: No     Allergies   Amoxicillin; Bee venom; Levofloxacin; Meloxicam; Vancomycin; Sulfa antibiotics; Ace inhibitors; Codeine; Meperidine hcl; Oxycodone-acetaminophen; Penicillins; and Tequin [gatifloxacin]   Review of Systems Review of Systems  Musculoskeletal: Positive for arthralgias and back pain.  All other systems reviewed and are negative.    Physical Exam Updated Vital Signs BP (!) 147/70 (BP Location: Right Arm)   Pulse (!) 107   Temp 98.5 F (36.9 C) (Oral)   Resp 18   SpO2 98%   Physical Exam  Constitutional: She is oriented to person, place, and time. She  appears well-developed and  well-nourished. No distress.  Non toxic  HENT:  Head: Normocephalic and atraumatic.  Nose: Nose normal.  Mouth/Throat: No oropharyngeal exudate.  Moist mucous membranes   Eyes: Pupils are equal, round, and reactive to light. Conjunctivae and EOM are normal.  Neck: Normal range of motion.  Cardiovascular: Normal rate, regular rhythm and intact distal pulses.  No murmur heard. 2+ DP and radial pulses bilaterally. No LE edema.   Pulmonary/Chest: Effort normal and breath sounds normal. No respiratory distress. She has no wheezes. She has no rales.  Abdominal: Soft. Bowel sounds are normal. There is no tenderness.  No G/R/R. No suprapubic or CVA tenderness.   Musculoskeletal: Normal range of motion. She exhibits tenderness. She exhibits no deformity.  CT spine: no midline tenderness  L spine: midline and paraspinal tenderness throughout.   Pelvis: no obvious instability with AP/L compression, leg shortening or rotation. Full PROM of hips without pain.  RLE: diffuse anterior knee tenderness, no obvious edema, erythema, ecchymosis or deformity. Decreased ROM of knee 2/2 pain.  TTP to medial malleolus. Decreased ROM of ankle 2/2 pain.   LLE: lateral knee tenderness, no edema, erythema, ecchymosis or deformity. Full ROM of knee.  No TTP to left ankle, full ROM.   Neurological: She is alert and oriented to person, place, and time.  RLE: 1/5 strength with hip, knee, ankle flexion and extension. Sensation to light touch intact.   LLE: 3/5 strength with hip flexion and extension. 3/5 strength with knee flexion and extension. 5/5 strength with ankle dorsiflexion and extension. Sensation to light touch grossly intact.   Skin: Skin is warm and dry. Capillary refill takes less than 2 seconds.  Psychiatric: She has a normal mood and affect. Her behavior is normal. Judgment and thought content normal.  Nursing note and vitals reviewed.    ED Treatments / Results  Labs (all labs ordered are listed,  but only abnormal results are displayed) Labs Reviewed  CBC WITH DIFFERENTIAL/PLATELET - Abnormal; Notable for the following components:      Result Value   WBC 11.8 (*)    Lymphs Abs 4.2 (*)    All other components within normal limits  BASIC METABOLIC PANEL - Abnormal; Notable for the following components:   Glucose, Bld 241 (*)    All other components within normal limits    EKG None  Radiology Dg Lumbar Spine Complete  Result Date: 07/20/2017 CLINICAL DATA:  Multiple falls today and over the last two weeks - pt had lumbar surgery 01.2019 - pt states inability to move right leg and little movement of left EXAM: LUMBAR SPINE - COMPLETE 4+ VIEW COMPARISON:  Lumbar MR 06/26/2017 FINDINGS: Normal alignment of the vertebral bodies. No loss vertebral body height or disc height. No subluxation. No pars defect. Gastric banding device noted. IMPRESSION: No acute findings lumbar spine by radiography. Electronically Signed   By: Suzy Bouchard M.D.   On: 07/20/2017 19:18   Dg Ankle Complete Right  Result Date: 07/20/2017 CLINICAL DATA:  Right ankle pain after multiple falls. EXAM: RIGHT ANKLE - COMPLETE 3+ VIEW COMPARISON:  None. FINDINGS: There is no evidence of fracture, dislocation, or joint effusion. There is no evidence of arthropathy or other focal bone abnormality. Soft tissues are unremarkable. IMPRESSION: Normal right ankle. Electronically Signed   By: Marijo Conception, M.D.   On: 07/20/2017 15:20   Dg Knee Complete 4 Views Left  Result Date: 07/20/2017 CLINICAL DATA:  Left knee pain after multiple falls.  EXAM: LEFT KNEE - COMPLETE 4+ VIEW COMPARISON:  None. FINDINGS: No evidence of fracture, dislocation, or joint effusion. No evidence of arthropathy or other focal bone abnormality. Soft tissues are unremarkable. IMPRESSION: Normal left knee. Electronically Signed   By: Marijo Conception, M.D.   On: 07/20/2017 15:22   Dg Knee Complete 4 Views Right  Result Date: 07/20/2017 CLINICAL DATA:   Right knee pain after multiple falls. EXAM: RIGHT KNEE - COMPLETE 4+ VIEW COMPARISON:  None. FINDINGS: No joint effusion is noted. Mildly displaced proximal right fibular fracture is noted. No fracture seen involving the femur or tibia. Joint spaces are intact. IMPRESSION: Mildly displaced proximal right fibular fracture. Electronically Signed   By: Marijo Conception, M.D.   On: 07/20/2017 15:19   Dg Hips Bilat W Or Wo Pelvis 2 Views  Result Date: 07/20/2017 CLINICAL DATA:  52 year old female with multiple recent falls. New onset lower extremity weakness. EXAM: DG HIP (WITH OR WITHOUT PELVIS) 2V BILAT COMPARISON:  Pelvis radiograph 12/29/2016. FINDINGS: Bone mineralization is within normal limits. The femoral heads are normally located. The pelvis appears intact. The SI joints today and sacral ala appear within normal limits. Negative visible bowel gas pattern. The proximal left femur is intact. The proximal right femur is intact. IMPRESSION: No acute fracture or dislocation identified about the bilateral hips or pelvis. Electronically Signed   By: Genevie Ann M.D.   On: 07/20/2017 19:18    Procedures Procedures (including critical care time)  Medications Ordered in ED Medications  HYDROcodone-acetaminophen (NORCO/VICODIN) 5-325 MG per tablet 1 tablet (1 tablet Oral Given 07/20/17 1715)     Initial Impression / Assessment and Plan / ED Course  I have reviewed the triage vital signs and the nursing notes.  Pertinent labs & imaging results that were available during my care of the patient were reviewed by me and considered in my medical decision making (see chart for details).  Clinical Course as of Jul 21 2043  Fri Jul 20, 2017  1536 FINDINGS: No joint effusion is noted. Mildly displaced proximal right fibular fracture is noted. No fracture seen involving the femur or tibia. Joint spaces are intact.   DG Knee Complete 4 Views Right [CG]  2952 RN notified me pt did not fit MRI machine    [CG]      Clinical Course User Index [CG] Kinnie Feil, PA-C   52 yo F here with increased weakness to LLE x 6 weeks, acutely worsening today.  At baseline, ambulates with roller walker with most weight bearing on LLE as her RLE is mostly non functional per pt (since pelvic fracture on Septmeber 2018). Long h/o chronic back pain, s/p decompression of L3-4 by Dr. Annette Stable [facility]) January 2019.  No fevers, abdominal pain, saddle anesthesia, bladder or bowel incontinence or retention, new numbness. On exam she has decreased strength at left hip and knee but preserved at ankle. Sensation grossly intact.  She has new R knee, R ankle and L knee pain since falls today.  X-rays reviewed, remarkable for R fibular fracture.  Will order knee immobilizer. Given acute onset of LLE weakness will order MRI lumbar.  1745: Pt unable to fit in MRI at [facility].  Spoke to radiology who recommends transfer to [facility] for MRI. Will consult neurosurgery for recommendations. Spoke to Dr. Christella Noa recommends x-ray lumbar spine and MRI.   Final Clinical Impressions(s) / ED Diagnoses   2012: x-ray lumbar and pelvis unremarkable. Screening labs WNL.  Will transfer to [facility] ER for MRI lumbar.  Spoke to [employer] regarding transfer from ER to ER.  Accepting provider to f/u on MRI and consult NSGY for recommendations. Pt may need PT/OT eval to assist with disposition.  Final diagnoses:  Weakness of left lower extremity    ED Discharge Orders    None       Kinnie Feil, PA-C 07/20/17 2045    Kinnie Feil, PA-C 07/21/17 1242    Charlesetta Shanks, MD 07/23/17 [phone]

## 2017-07-20 NOTE — Telephone Encounter (Signed)
Pt stating she has fallen 3 times this morning and 2 times have required the fire department to come out to the home to assist the patient with getting up. Pt states when she tries to stand up using her walker her legs just give out and she falls to the ground. Pt reports that during the second fall she did hit her head on the concrete, but repots that she did not have LOC, nausea or any other symptoms at this time. Pt not voicing any complaints of pain from being injured from the falls. Pt states that she has contacted her neurosurgeon and has not received a call back at this time. Pt states she does not know if she would be able to physically walk to come in for an appt. Pt advised to be treated in the ED due to inability to stand or walk without falling at this time. Pt verbalized understanding.  Reason for Disposition . [1] SEVERE weakness (i.e., unable to walk or barely able to walk, requires support) AND     [2] new onset or worsening  Protocols used: WEAKNESS (GENERALIZED) AND FATIGUE-A-AH

## 2017-07-20 NOTE — ED Triage Notes (Signed)
Per [facility], pt from home with multiple mechanical falls today. States a long history of knee and hip surgeries and right knee gave out. No syncope, reports pain 8/10. Reports increased weakness in lower limbs. Unable to take daily HTN meds and insulin today.

## 2017-07-20 NOTE — ED Provider Notes (Signed)
Medical screening examination/treatment/procedure(s) were conducted as a shared visit with non-physician practitioner(s) and myself.  I personally evaluated the patient during the encounter.  None She reports that her left leg has given out multiple times today.  She reports it is weak.  Did cause her to fall and injure her right knee.  At baseline her right leg is very weak and she uses a rolling scooter and a walker for pivoting.  Generally her left leg is her stronger extremity.  Today she is required help from her husband to bear weight.  Patient is alert and appropriate.  Nontoxic.  No respiratory distress.  No visual deformity contusion or abrasion to the lower extremities.  Patient does endorse pain with deep flexion of the left knee.  No visible effusion or erythema.  She is weak for elevating the leg off of the bed.  She does have strength for plantar flexion and extension.  PA-C Donney Rankins has made consultations and arrangements for MRI at [facility] due to patient's body habitus.  I agree with plan of management.   Charlesetta Shanks, MD 07/20/17 [phone]

## 2017-07-21 ENCOUNTER — Emergency Department (HOSPITAL_COMMUNITY): Payer: BC Managed Care – PPO

## 2017-07-21 ENCOUNTER — Encounter (HOSPITAL_COMMUNITY): Payer: Self-pay | Admitting: Internal Medicine

## 2017-07-21 ENCOUNTER — Other Ambulatory Visit: Payer: Self-pay

## 2017-07-21 DIAGNOSIS — S82831A Other fracture of upper and lower end of right fibula, initial encounter for closed fracture: Secondary | ICD-10-CM | POA: Diagnosis present

## 2017-07-21 DIAGNOSIS — M5116 Intervertebral disc disorders with radiculopathy, lumbar region: Secondary | ICD-10-CM | POA: Diagnosis present

## 2017-07-21 DIAGNOSIS — M48061 Spinal stenosis, lumbar region without neurogenic claudication: Secondary | ICD-10-CM | POA: Diagnosis present

## 2017-07-21 DIAGNOSIS — R296 Repeated falls: Secondary | ICD-10-CM

## 2017-07-21 DIAGNOSIS — R262 Difficulty in walking, not elsewhere classified: Secondary | ICD-10-CM | POA: Diagnosis present

## 2017-07-21 HISTORY — DX: Other fracture of upper and lower end of right fibula, initial encounter for closed fracture: S82.831A

## 2017-07-21 LAB — CBC
HEMATOCRIT: 41.7 % (ref 36.0–46.0)
HEMOGLOBIN: 13.4 g/dL (ref 12.0–15.0)
MCH: 28.6 pg (ref 26.0–34.0)
MCHC: 32.1 g/dL (ref 30.0–36.0)
MCV: 89.1 fL (ref 78.0–100.0)
Platelets: 264 10*3/uL (ref 150–400)
RBC: 4.68 MIL/uL (ref 3.87–5.11)
RDW: 14.5 % (ref 11.5–15.5)
WBC: 8.7 10*3/uL (ref 4.0–10.5)

## 2017-07-21 LAB — CREATININE, SERUM
Creatinine, Ser: 0.74 mg/dL (ref 0.44–1.00)
GFR calc Af Amer: >60 mL/min (ref >60)

## 2017-07-21 LAB — GLUCOSE, CAPILLARY: Glucose-Capillary: 340 mg/dL — ABNORMAL HIGH (ref 65–99)

## 2017-07-21 LAB — HEMOGLOBIN A1C
Hgb A1c MFr Bld: 9.5 % — ABNORMAL HIGH (ref 4.8–5.6)
MEAN PLASMA GLUCOSE: 225.95 mg/dL

## 2017-07-21 LAB — CBG MONITORING, ED: GLUCOSE-CAPILLARY: 278 mg/dL — AB (ref 65–99)

## 2017-07-21 LAB — TSH: TSH: 5.406 u[IU]/mL — AB (ref 0.350–4.500)

## 2017-07-21 MED ORDER — LORATADINE 10 MG PO TABS
10.0000 mg | ORAL_TABLET | Freq: Every day | ORAL | Status: DC
  Administered 2017-07-21 – 2017-07-25 (×5): 10 mg via ORAL
  Filled 2017-07-21 (×5): qty 1

## 2017-07-21 MED ORDER — INSULIN ASPART 100 UNIT/ML ~~LOC~~ SOLN: 18.0000 [IU] | Freq: Three times a day (TID) | SUBCUTANEOUS | Status: DC

## 2017-07-21 MED ORDER — HYDROCODONE-ACETAMINOPHEN 5-325 MG PO TABS
1.0000 | ORAL_TABLET | Freq: Two times a day (BID) | ORAL | Status: DC | PRN
  Administered 2017-07-21 – 2017-07-25 (×8): 1 via ORAL
  Filled 2017-07-21 (×9): qty 1

## 2017-07-21 MED ORDER — LOSARTAN POTASSIUM 50 MG PO TABS
50.0000 mg | ORAL_TABLET | Freq: Every day | ORAL | Status: DC
Start: 2017-07-21 — End: 2017-07-26
  Administered 2017-07-21 – 2017-07-25 (×5): 50 mg via ORAL
  Filled 2017-07-21 (×5): qty 1

## 2017-07-21 MED ORDER — POLYETHYLENE GLYCOL 3350 17 G PO PACK
17.0000 g | PACK | Freq: Every day | ORAL | Status: DC | PRN
  Administered 2017-07-25: 17 g via ORAL
  Filled 2017-07-21: qty 1

## 2017-07-21 MED ORDER — FLUOXETINE HCL 20 MG PO CAPS
20.0000 mg | ORAL_CAPSULE | Freq: Every day | ORAL | Status: DC
Start: 2017-07-21 — End: 2017-07-26
  Administered 2017-07-21 – 2017-07-25 (×5): 20 mg via ORAL
  Filled 2017-07-21 (×5): qty 1

## 2017-07-21 MED ORDER — HYDROCHLOROTHIAZIDE 25 MG PO TABS
12.5000 mg | ORAL_TABLET | Freq: Every day | ORAL | Status: DC
  Administered 2017-07-21 – 2017-07-25 (×5): 12.5 mg via ORAL
  Filled 2017-07-21 (×5): qty 1

## 2017-07-21 MED ORDER — ONDANSETRON 4 MG PO TBDP: 8.0000 mg | ORAL_TABLET | Freq: Three times a day (TID) | ORAL | Status: DC | PRN

## 2017-07-21 MED ORDER — INSULIN ASPART 100 UNIT/ML ~~LOC~~ SOLN
0.0000 [IU] | Freq: Three times a day (TID) | SUBCUTANEOUS | Status: DC
  Administered 2017-07-21: 11 [IU] via SUBCUTANEOUS
  Administered 2017-07-22: 15 [IU] via SUBCUTANEOUS
  Administered 2017-07-22: 11 [IU] via SUBCUTANEOUS

## 2017-07-21 MED ORDER — MORPHINE SULFATE (PF) 4 MG/ML IV SOLN
4.0000 mg | Freq: Once | INTRAVENOUS | Status: AC
  Administered 2017-07-21: 4 mg via INTRAVENOUS
  Filled 2017-07-21: qty 1

## 2017-07-21 MED ORDER — DULAGLUTIDE 0.75 MG/0.5ML ~~LOC~~ SOAJ: 0.5000 mL | SUBCUTANEOUS | Status: DC

## 2017-07-21 MED ORDER — GABAPENTIN 300 MG PO CAPS
300.0000 mg | ORAL_CAPSULE | Freq: Three times a day (TID) | ORAL | Status: DC
  Administered 2017-07-21 – 2017-07-25 (×14): 300 mg via ORAL
  Filled 2017-07-21 (×14): qty 1

## 2017-07-21 MED ORDER — FLUTICASONE PROPIONATE 50 MCG/ACT NA SUSP
2.0000 | Freq: Every day | NASAL | Status: DC
  Administered 2017-07-21 – 2017-07-24 (×4): 2 via NASAL
  Filled 2017-07-21: qty 16

## 2017-07-21 MED ORDER — CYCLOBENZAPRINE HCL 10 MG PO TABS
10.0000 mg | ORAL_TABLET | Freq: Three times a day (TID) | ORAL | Status: DC
  Administered 2017-07-21 – 2017-07-25 (×14): 10 mg via ORAL
  Filled 2017-07-21 (×14): qty 1

## 2017-07-21 MED ORDER — INSULIN ASPART 100 UNIT/ML ~~LOC~~ SOLN
0.0000 [IU] | Freq: Every day | SUBCUTANEOUS | Status: DC
  Administered 2017-07-21: 4 [IU] via SUBCUTANEOUS

## 2017-07-21 MED ORDER — LEVOCETIRIZINE DIHYDROCHLORIDE 5 MG PO TABS: 5.0000 mg | ORAL_TABLET | Freq: Every evening | ORAL | Status: DC

## 2017-07-21 MED ORDER — INSULIN ASPART 100 UNIT/ML ~~LOC~~ SOLN
18.0000 [IU] | Freq: Three times a day (TID) | SUBCUTANEOUS | Status: DC
  Filled 2017-07-21 (×26): qty 0.18

## 2017-07-21 MED ORDER — INSULIN DETEMIR 100 UNIT/ML ~~LOC~~ SOLN
36.0000 [IU] | Freq: Two times a day (BID) | SUBCUTANEOUS | Status: DC
Start: 2017-07-21 — End: 2017-07-26
  Administered 2017-07-21 – 2017-07-25 (×9): 36 [IU] via SUBCUTANEOUS
  Filled 2017-07-21 (×14): qty 0.36

## 2017-07-21 MED ORDER — ENOXAPARIN SODIUM 60 MG/0.6ML ~~LOC~~ SOLN
60.0000 mg | SUBCUTANEOUS | Status: DC
  Administered 2017-07-21 – 2017-07-25 (×5): 60 mg via SUBCUTANEOUS
  Filled 2017-07-21 (×6): qty 0.6

## 2017-07-21 MED ORDER — ATORVASTATIN CALCIUM 40 MG PO TABS
40.0000 mg | ORAL_TABLET | Freq: Every day | ORAL | Status: DC
  Administered 2017-07-21 – 2017-07-25 (×5): 40 mg via ORAL
  Filled 2017-07-21 (×5): qty 1

## 2017-07-21 MED ORDER — KETOROLAC TROMETHAMINE 30 MG/ML IJ SOLN
30.0000 mg | Freq: Four times a day (QID) | INTRAMUSCULAR | Status: DC | PRN
  Administered 2017-07-21: 30 mg via INTRAVENOUS
  Filled 2017-07-21: qty 1

## 2017-07-21 MED ORDER — NAPROXEN 250 MG PO TABS
500.0000 mg | ORAL_TABLET | Freq: Two times a day (BID) | ORAL | Status: DC
  Administered 2017-07-22 – 2017-07-25 (×4): 500 mg via ORAL
  Filled 2017-07-21 (×7): qty 2

## 2017-07-21 MED ORDER — INSULIN DETEMIR 100 UNIT/ML ~~LOC~~ SOLN
36.0000 [IU] | Freq: Two times a day (BID) | SUBCUTANEOUS | Status: DC
  Filled 2017-07-21: qty 0.36

## 2017-07-21 MED ORDER — ACETAMINOPHEN 500 MG PO TABS: 500.0000 mg | ORAL_TABLET | Freq: Every day | ORAL | Status: DC | PRN

## 2017-07-21 NOTE — Progress Notes (Signed)
    We were called regarding this patient.  She was found to have a right proximal  Fibula fracture.  This is minimally displaced.  Given the overall nonweightbearing status of the fibula there is minimal treatment necessitated for this.  This should go on to heal without any difficulty.  If the patient is having substantial pain a hinge knee brace could be utilized for pain control, otherwise no bracing or splinting is required for this pathology. No additional treatment or workup is necessitated.  She is weightbearing as tolerated.  Electronically Verified                                                       Pricilla Holm, PA-C  [place] [facility]  [phone]

## 2017-07-21 NOTE — ED Notes (Signed)
RN call pharmacy to have medications verified so the RN can administer medications to patient.

## 2017-07-21 NOTE — Progress Notes (Addendum)
Patient trasfered from [place] to [phone] via bed; alert and oriented x 4; no complaints of pain; IV saline locked in RAC; skin intact. Orient patient to room and unit; gave patient care guide; right hinge knee brace in place; instructed how to use the call bell and  fall risk precautions. Will continue to monitor the patient.

## 2017-07-21 NOTE — ED Notes (Signed)
[place] provided to patient

## 2017-07-21 NOTE — Progress Notes (Signed)
Rehab Admissions Coordinator Note:  Patient was screened by Retta Diones for appropriateness for an Inpatient Acute Rehab Consult.  At this time, we are recommending Inpatient Rehab consult.  Retta Diones 07/21/2017, 3:44 PM  I can be reached at [phone].

## 2017-07-21 NOTE — Evaluation (Addendum)
Occupational Therapy Evaluation Patient Details Name: [name] MRN: [id number] DOB: 1965/10/16 Today's Date: 07/21/2017    History of Present Illness This 52 y.o. female brought to ED after sustaining multiple falls due to worsening LE weakness.  x - ray showed Rt fibular fx.  MRI of lumbar spine showed stable edema present within the Rt sided paraspinal muscles and sucutaneous fat at L4-5 and L5-S1.   PMH:   DM, sacral fx due to fall 9/18 with subsequent  LE weakness, h/o parathesias, morbid obesity, DM, anxiety, recent back surgery.    Clinical Impression   Pt admitted with above. She demonstrates the below listed deficits and will benefit from continued OT to maximize safety and independence with BADLs.  Pt presents with significant pain Rt LE, as well as bil. LE weakness although inconsistencies noted in eval process.  She is very anxious of falling and concerning recent events.   She currently requires min A for UB ADLs and max - total A for LB ADLs, and required mod A +2 to move to EOB - unable to attempt standing or transfers due to pain, anxiety, and weakness.  She lived with spouse, and has a progressive decline in function (per her report), over the past several months, and has required increased level of assist from family.   She has had multiple falls at home, and family is unable to safely assist her in the home environment.   Feel she will needCIR level rehab to allow her to maximize safety and independence with ADLs, reduce risk of falls and reduce risk of readmission.   Will follow acutely.       Follow Up Recommendations  Recommend CIR if she has a qualifying diagnosi, if not will need SNF  - 24 hour    Equipment Recommendations  None recommended by OT    Recommendations for Other Services       Precautions / Restrictions Precautions Precautions: Fall Precaution Comments: Pt with multiple falls on day of admission.   She also reports frequent falls at home since fall  of 2018 Required Braces or Orthoses: Knee Immobilizer - Right Knee Immobilizer - Right: On at all times Restrictions Weight Bearing Restrictions: Yes RLE Weight Bearing: Non weight bearing(no formal orders in chart )      Mobility Bed Mobility Overal bed mobility: Needs Assistance Bed Mobility: Supine to Sit;Sit to Supine     Supine to sit: Mod assist;+2 for physical assistance;+2 for safety/equipment Sit to supine: Min assist;+2 for physical assistance;+2 for safety/equipment   General bed mobility comments: Pt very anxious with movement - fear of falling.  She requires step by step cues and instruction.  She requires max A to move LEs off bed and c/o pain Rt ankle and knee.   She uses bedrail heavily to lift trunk and requires assist to lift shoulders and scoot hips toward EOB.  When moving back to bed, pt able to lift bil. LEs and place them on the bed with min A only to initiate movement.  She was able to scoot hips back onto bed and lower trunk with use of bedrails. She was able to lift both LEs ~2-3" off the bed to adjust the sheets   Transfers Overall transfer level: Needs assistance               General transfer comment: unable to attempt due pain, weakness, and body habitus and pt's height.  ED bed does not lower adequately to safely attempt a  stand, and pt indicates pain too severe     Balance Overall balance assessment: Needs assistance Sitting-balance support: Feet unsupported;Single extremity supported Sitting balance-Leahy Scale: Poor Sitting balance - Comments: requires UE support                                    ADL either performed or assessed with clinical judgement   ADL Overall ADL's : Needs assistance/impaired Eating/Feeding: Independent   Grooming: Wash/dry hands;Wash/dry face;Oral care;Brushing hair;Set up;Sitting   Upper Body Bathing: Set up;Sitting;Bed level   Lower Body Bathing: Maximal assistance;Bed level   Upper Body  Dressing : Minimal assistance;Sitting;Bed level   Lower Body Dressing: Total assistance;Bed level   Toilet Transfer: Total assistance Toilet Transfer Details (indicate cue type and reason): unable to safely attempt  Toileting- Clothing Manipulation and Hygiene: Total assistance;Bed level       Functional mobility during ADLs: Moderate assistance;+2 for physical assistance;+2 for safety/equipment(bed mobility only )       Vision         Perception     Praxis      Pertinent Vitals/Pain Pain Assessment: Faces Faces Pain Scale: Hurts whole lot Pain Location: Rt knee and ankle (report Rt ankle is chronic) Pain Descriptors / Indicators: Aching;Grimacing;Guarding Pain Intervention(s): Monitored during session;Repositioned;Premedicated before session;Limited activity within patient's tolerance     Hand Dominance Right   Extremity/Trunk Assessment Upper Extremity Assessment Upper Extremity Assessment: Overall WFL for tasks assessed   Lower Extremity Assessment Lower Extremity Assessment: Defer to PT evaluation   Cervical / Trunk Assessment Cervical / Trunk Assessment: Normal   Communication Communication Communication: No difficulties   Cognition Arousal/Alertness: Awake/alert Behavior During Therapy: Anxious Overall Cognitive Status: Within Functional Limits for tasks assessed                                 General Comments: Pt very anxious throughout eval.  Fluctuates between being tearful, to joking.  She was very fearful of falling    General Comments  spoke with RN and PA re: eval performance     Exercises     Shoulder Instructions      Home Living Family/patient expects to be discharged to:: Private residence   Available Help at Discharge: Family;Available 24 hours/day;Available PRN/intermittently Type of Home: Mobile home Home Access: Stairs to enter Entrance Stairs-Number of Steps: 4   Home Layout: One level     Bathroom Shower/Tub:  Occupational psychologist: Handicapped height Bathroom Accessibility: Yes How Accessible: Accessible via walker Home Equipment: [place] - 2 wheels;Bedside commode;Shower seat;Grab bars - toilet;Grab bars - tub/shower;Hand held shower head;Wheelchair - manual          Prior Functioning/Environment Level of Independence: Needs assistance  Gait / Transfers Assistance Needed: Pt reports she ambulated with RW, but has had multiple falls.  ADL's / Homemaking Assistance Needed: Pt reports she has required assist for LB ADLs due to LE weakness             OT Problem List: Decreased strength;Decreased activity tolerance;Impaired balance (sitting and/or standing);Decreased safety awareness;Decreased knowledge of use of DME or AE;Obesity;Pain      OT Treatment/Interventions: Self-care/ADL training;Therapeutic exercise;DME and/or AE instruction;Therapeutic activities;Patient/family education;Balance training    OT Goals(Current goals can be found in the care plan section) Acute Rehab OT Goals Patient Stated Goal: to walk  OT Goal Formulation: With patient Time For Goal Achievement: 08/04/17 Potential to Achieve Goals: Good ADL Goals Pt Will Perform Upper Body Bathing: with set-up;with supervision;sitting Pt Will Perform Lower Body Bathing: with min assist;with adaptive equipment;sit to/from stand Pt Will Perform Upper Body Dressing: with set-up;with supervision;sitting Pt Will Perform Lower Body Dressing: with min assist;sit to/from stand Pt Will Transfer to Toilet: with min assist;stand pivot transfer;bedside commode Pt Will Perform Toileting - Clothing Manipulation and hygiene: with min assist;sit to/from stand  OT Frequency: Min 2X/week   Barriers to D/C: Decreased caregiver support  spouse unable to provide current level of care        Co-evaluation PT/OT/SLP Co-Evaluation/Treatment: Yes     OT goals addressed during session: ADL's and self-care;Strengthening/ROM       AM-PAC PT "6 Clicks" Daily Activity     Outcome Measure Help from another person eating meals?: None Help from another person taking care of personal grooming?: None Help from another person toileting, which includes using toliet, bedpan, or urinal?: Total Help from another person bathing (including washing, rinsing, drying)?: A Lot Help from another person to put on and taking off regular upper body clothing?: A Lot Help from another person to put on and taking off regular lower body clothing?: Total 6 Click Score: 14   End of Session Equipment Utilized During Treatment: Right knee immobilizer Nurse Communication: Mobility status  Activity Tolerance: Patient limited by pain Patient left: in bed;with call bell/phone within reach  OT Visit Diagnosis: Unsteadiness on feet (R26.81);Repeated falls (R29.6);Pain Pain - Right/Left: Right Pain - part of body: Knee;Ankle and joints of foot                Time: 3536-1443 OT Time Calculation (min): 30 min Charges:  OT General Charges $OT Visit: 1 Visit OT Evaluation $OT Eval Moderate Complexity: 1 Mod G-Codes:     [facility], OTR/L [phone]   Lucille Passy M 07/21/2017, 9:58 AM

## 2017-07-21 NOTE — ED Provider Notes (Signed)
12:45 AM Patient accepted in transfer from [facility] for MR lumbar spine.  She has had worsening left lower extremity weakness over the past few weeks, though this has been largely progressive since "emergency" back surgery by Dr. Trenton Gammon in January.  She is scheduled for future steroid injection in her low back.  She has had inability to bear weight on her left lower extremity today, stating that her leg will "give out".  She has had no sensation changes in her left lower extremity.  Patellar and Achilles reflexes intact.  No reports of bowel or bladder incontinence.  MRI ordered for further evaluation of symptoms.  Neurosurgery consulted by previous provider.  Patient resting comfortably at this time.  6:10 AM Patient currently undergoing MRI.  She will require PT/OT evaluation.  Low suspicion for central cause of symptoms, especially given preserved foot movement, sensation, distal reflexes.  I have discussed the case in person with Dr. Christella Noa who is aware of the patient's case.  At this time, Dr. Christella Noa did not see any emergent indication for neurosurgical intervention.    Patient signed out to Irena Cords, PA-C at change of shift who will follow-up on imaging.   Vitals:   07/21/17 0400 07/21/17 0415 07/21/17 0445 07/21/17 0500  BP: (!) 108/56 (!) 105/51 (!) 105/59 (!) 106/59  Pulse: 98 99 100 96  Resp:      Temp:      TempSrc:      SpO2: 99% 95% 95% 94%      Antonietta Breach, PA-C 07/21/17 6712    Ripley Fraise, MD 07/21/17 [id number]

## 2017-07-21 NOTE — Clinical Social Work Note (Signed)
Clinical Social Work Assessment  Patient Details  Name: [name] MRN: [id number] Date of Birth: Jun 07, 1965  Date of referral:  07/21/17               Reason for consult:  Facility Placement                Permission sought to share information with:  Family Supports Permission granted to share information::  Yes, Verbal Permission Granted  Name::     Avanna Sowder   Agency::  family  Relationship::   spouse  Contact Information:   Makenah Karas [phone]  Housing/Transportation Living arrangements for the past 2 months:  Mobile Home(with husband.) Source of Information:  Patient Patient Interpreter Needed:  None Criminal Activity/Legal Involvement Pertinent to Current Situation/Hospitalization:  No - Comment as needed Significant Relationships:  Adult Children, Parents, Spouse Lives with:  Spouse Do you feel safe going back to the place where you live?  Yes Need for family participation in patient care:  Yes (Comment)  Care giving concerns:  CSW spoke with pt and spouse at bedside. At this time CSW was informed that at this time pt has been concerned with not being able to walk and pt expressed "im just grateful to have someone that is truly trying to help me".    Social Worker assessment / plan:  CSW spoke with pt and spouse at bedside. During this time CSW was informed that pt is from home with husband and has support from husband as well as other family memebers. Pt expressed interest in going to CIR as pt was told that this may be avalaibel to pt by PT. CSW offered back up SNF as a back up option to pt just in case CIR is unabel to take pt at the time of discharge. Pt expressed being intrested in [facility] first and then [facility] as second choice.   During this assessment pt was very grateful and thanked CSW for support and help during this time. Pt was sitting upright in bed with not distress noted at this time.   Employment status:  Retired Forensic scientist:   Other (Comment Required)([employer]) PT Recommendations:  Not assessed at this time Information / Referral to community resources:  Skilled Nursing Facility(spoke with pt about SNF placement options. )  Patient/Family's Response to care:  Pt's response to care is appropriate for diagnosis given at this time.   Patient/Family's Understanding of and Emotional Response to Diagnosis, Current Treatment, and Prognosis:  At this time no further questions or concerns have been presented to CSW. Pt's emotional response to care was grateful and tearful as pt expressed that pt finally feels that pt is getting the help that pt has been needing.   Emotional Assessment Appearance:  Appears stated age Attitude/Demeanor/Rapport:  Gracious, Engaged Affect (typically observed):  Appropriate, Pleasant, Happy Orientation:  Oriented to Self, Oriented to Place, Oriented to Situation, Oriented to  Time Alcohol / Substance use:  Not Applicable Psych involvement (Current and /or in the community):  No (Comment)  Discharge Needs  Concerns to be addressed:  No discharge needs identified Readmission within the last 30 days:  No Current discharge risk:  Dependent with Mobility Barriers to Discharge:  Continued Medical Work up   [employer], [place] 07/21/2017, 11:31 AM

## 2017-07-21 NOTE — ED Notes (Signed)
MRI Called for ETA.  Pt updated

## 2017-07-21 NOTE — ED Notes (Signed)
Pt transported to MRI 

## 2017-07-21 NOTE — H&P (Signed)
History and Physical    [name] DOB: [birth year] DOA: 07/20/2017  PCP: Pleas Koch, NP  Patient coming from: Home to [facility], ED transfer to our emergency department for MRI.  I have personally briefly reviewed patient's old medical records in [place]  Chief Complaint: Multiple falls at home x4 with right lower extremity pain  HPI: [name] is a 52 y.o. female with medical history significant for diabetes, hypertension, hypercholesterolemia, morbid obesity, spinal problems with back surgery in January 2019 due to a bulging disc in her lumbar [(? Pt says sacral area).  Patient states that it was a sacral area however there are no discs there so we are going to request records from Dr. Irven [place] office.]  Who presented to the emergency department with significant pain in her right lower extremity after multiple falls at home.  Patient states that since her sacral surgery in January she has had progressive weakness of the lower extremities to the point where she can no longer stand and bear weight.  She has had frequent falls but today morning it was white excessive and she no longer feels safe at home.  Patient states that she tried to get up yesterday morning and fell 3 times.  The fourth time she tried to get up her husband had assisted her with a bedside commode she needed to go to the bathroom she was unable to get up she tried to get up and pivot using a walker to get to the bedside commode but fell and it immediately developed severe pain in her right knee.  Is diagnosed with a fibular fracture but due to her inability to walk was transferred from the [facility] long emergency department to the [facility] emergency department for an MRI of her back.  She underwent MRI of her back which did not show any acute changes and mostly stable lower lumbar disc problems namely L4-L5 disc bulging with right-sided contact with the nerve and L4-L5 and L5-S1 neural foraminal  stenoses.  The thought was that she might be able to go home she was unable to stand physical therapy and Occupational Therapy were consulted and felt that the patient is unsafe for discharge home and therefore will be admitted to the hospital for further evaluation and referral to inpatient rehab.  Currently the patient denies headache, blurry vision, sore throat, mouth ulcers, cough, sputum production, chest pain, shortness of breath, lower extremity edema, abdominal pain, nausea, vomiting, diarrhea, constipation, dysuria, urinary frequency, or urgency, no skin rashes or masses.  ED Course: Ray show displaced proximal right fibular fracture, MRI was unremarkable, patient unable to stand even with assistance x2.  Referral for admission.  [place] with Dr. Charmian Muff from neurosurgery who did not feel any acute neurosurgical issues were present.  Mr. Cathi Roan from the ER is attempting to reach [facility] orthopedics regarding patient's knee.  Review of Systems: As per HPI otherwise all other systems reviewed and  negative.    Past Medical History:  Diagnosis Date  . Allergy   . Anemia   . Anxiety   . Arthritis   . Asthma   . Colon polyp   . Diabetes mellitus   . Edema   . Fatigue   . Hypertension   . IBS (irritable bowel syndrome)   . Lump in female breast   . Methicillin resistant Staphylococcus aureus in conditions classified elsewhere and of unspecified site   . Migraines   . Morbid obesity ([place])   .  Night sweats   . Nonspecific abnormal results of thyroid function study   . Other B-complex deficiencies   . Paresthesias   . Pure hypercholesterolemia   . Reflux   . Sacral fracture ([place])   . Sinus problem   . Symptomatic states associated with artificial menopause   . Thyroid disease   . Type II or unspecified type diabetes mellitus without mention of complication, not stated as uncontrolled   . Unspecified sleep apnea     Past Surgical History:  Procedure Laterality Date  .  abcess removal  1997   MRSA  . ABDOMINAL HYSTERECTOMY  1997   complete in 1997, partial was in 1995  . carpal tunnel release r  07/03/13   [place]  . [place]  . CHOLECYSTECTOMY  03/1989  . COLONOSCOPY  03/21/2011   Normal.  [place].  [name] KNEE SURGERY  2006  . LAPAROSCOPIC GASTRIC BANDING  05/2009  . TONSILLECTOMY AND ADENOIDECTOMY  1974  . TUBAL LIGATION      Social History   Social History Narrative   Exercise: walking two days per week, 30 minutes.       Married x 29 years, happily married; no abuse.       Children: 3 children; 1 grandchild; two step grandchildren; 1 gg.      Lives: with husband, mother-in-law.      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      Tobacco: never      Alcohol: socially.  One glass of wine per month.      Exercise:  Walking around campus.        Seatbelt: 100%      Guns: gun safe in garage.     reports that she has never smoked. She has never used smokeless tobacco. She reports that she drinks alcohol. She reports that she does not use drugs.  Allergies  Allergen Reactions  . Amoxicillin Shortness Of Breath and Rash  . Bee Venom Anaphylaxis  . Levofloxacin Shortness Of Breath and Rash  . Meloxicam Hives and Rash  . Vancomycin Anaphylaxis  . Sulfa Antibiotics Rash  . Ace Inhibitors Cough  . Codeine   . Meperidine Hcl   . Oxycodone-Acetaminophen   . Penicillins Swelling and Rash    Has patient had a PCN reaction causing immediate rash, facial/tongue/throat swelling, SOB or lightheadedness with hypotension: Yes Has patient had a PCN reaction causing severe rash involving mucus membranes or skin necrosis: Yes Has patient had a PCN reaction that required hospitalization: No Has patient had a PCN reaction occurring within the last 10 years: No If all of the above answers are "NO", then may proceed with Cephalosporin use.   [name] [Gatifloxacin] Rash    Family History  Problem Relation Age of Onset   . Diabetes Mother   . Heart disease Mother 94       CHF, CAD  . Other Mother        muscle disease  . Hypertension Mother   . Hyperlipidemia Mother   . Arthritis Mother        OA hip L s/p THR  . Diabetes Father   . Heart disease Father        AMI/CABG/valve replacement age 34.  . Stroke Father   . Diabetes Brother   . Heart disease Brother        stent age 56.  . Fibromyalgia Daughter   . Crohn's disease  Daughter   . Hypertension Son   . Alzheimer's disease Maternal Grandmother   . Emphysema Maternal Grandfather   . Heart disease Paternal Grandmother   . Breast cancer Unknown        2 Aunts    Prior to Admission medications   Medication Sig Start Date End Date Taking? Authorizing Provider  acetaminophen (TYLENOL) 500 MG tablet Take 500 mg by mouth daily as needed for moderate pain.   Yes [provider]  atorvastatin (LIPITOR) 40 MG tablet TAKE 1 TABLET BY MOUTH DAILY 10/27/16  Yes Pleas Koch, NP  cyclobenzaprine (FLEXERIL) 10 MG tablet Take 10 mg by mouth 3 (three) times daily. 07/03/17  Yes [provider]  Dulaglutide (TRULICITY) 6.22 QJ/3.3LK SOPN Inject 0.5 ml once weekly for diabetes. 06/04/17  Yes Pleas Koch, NP  Fexofenadine HCl (ALLEGRA PO) Take by mouth.     Yes [provider]  FLUoxetine (PROZAC) 20 MG capsule Take 1 capsule (20 mg total) by mouth daily. 02/15/17  Yes Pleas Koch, NP  fluticasone (FLONASE) 50 MCG/ACT nasal spray Place 2 sprays into both nostrils daily. 01/12/14  Yes Wardell Honour, MD  gabapentin (NEURONTIN) 300 MG capsule TAKE 1 CAPSULE BY MOUTH THREE TIMES A DAY Patient taking differently: TAKE 1 CAPSULE (300 mg)  BY MOUTH THREE TIMES A DAY 09/06/16  Yes Pleas Koch, NP  glipiZIDE-metformin (METAGLIP) 2.5-500 MG tablet TAKE TWO (2) TABLETS BY MOUTH 2 TIMES DAILY BEFORE A MEAL. 04/09/17  Yes Pleas Koch, NP  hydrochlorothiazide (HYDRODIURIL) 25 MG tablet TAKE 1/2 TABLET BY MOUTH EVERY  DAY. Patient taking differently: TAKE 1/2 TABLET  (12.5 mg) BY MOUTH EVERY DAY. 06/11/17  Yes Pleas Koch, NP  HYDROcodone-acetaminophen (NORCO/VICODIN) 5-325 MG tablet Take 1 tablet by mouth 2 (two) times daily as needed for moderate pain.  12/25/16  Yes [provider]  insulin aspart (NOVOLOG FLEXPEN) 100 UNIT/ML FlexPen Inject 18 Units into the skin 3 (three) times daily with meals. 12/05/16  Yes Pleas Koch, NP  LEVEMIR FLEXTOUCH 100 UNIT/ML Pen INJECT 35 UNITS EVERY MORNING AND 35 UNITS IN THE EVENING AS DIRECTED Patient taking differently: INJECT 36 UNITS EVERY MORNING AND 36 UNITS IN THE EVENING AS DIRECTED 06/14/17  Yes Pleas Koch, NP  levocetirizine (XYZAL) 5 MG tablet TAKE ONE TABLET BY MOUTH EVERY EVENING Patient taking differently: TAKE ONE TABLET (5 mg)  BY MOUTH EVERY EVENING 07/16/17  Yes Pleas Koch, NP  losartan (COZAAR) 100 MG tablet TAKE 1/2 TABLET BY MOUTH EVERY DAY. Patient taking differently: TAKE 1/2 TABLET (50 mg) BY MOUTH EVERY DAY. 03/26/17  Yes Pleas Koch, NP  Multiple Vitamin (MULTIVITAMIN PO) Take by mouth daily.     Yes [provider]  naproxen (NAPROSYN) 500 MG tablet Take 1 tablet (500 mg total) by mouth 2 (two) times daily with a meal. 02/28/17  Yes Pleas Koch, NP  ondansetron (ZOFRAN-ODT) 4 MG disintegrating tablet TAKE 1 TABLET BY MOUTH EVERY 8 HOURS AS NEEDED FOR NAUSEA AS DIRECTED. 02/06/17  Yes Pleas Koch, NP  Blood Glucose Monitoring Suppl (ONE TOUCH ULTRA MINI) w/Device KIT Use as instructed to test blood sugar 5 times daily. 06/12/17   Pleas Koch, NP  Continuous Blood Gluc Sensor (FREESTYLE LIBRE 14 DAY SENSOR) MISC 1 each by Does not apply route every 14 (fourteen) days. 05/28/17   Pleas Koch, NP  glucose blood test strip ONE TOUCH ULTRA MINI Use as instructed  to test blood sugar 5 times daily. 06/12/17   Pleas Koch, NP  Insulin Pen Needle (UNIFINE PENTIPS) 32G X 4 MM MISC  Use as directed for insulin injection daily. 08/22/16   Pleas Koch, NP  Lancets [facility] ULTRASOFT) lancets Use as instructed to test blood sugar 3 times daily 06/11/17   Pleas Koch, NP    Physical Exam:  Constitutional: NAD, calm, comfortable Vitals:   07/21/17 0445 07/21/17 0500 07/21/17 0922 07/21/17 1000  BP: (!) 105/59 (!) 106/59 138/60 130/60  Pulse: 100 96 99 98  Resp:   17 18  Temp:      TempSrc:      SpO2: 95% 94% 97% 94%   Eyes: PERRL, lids and conjunctivae normal ENMT: Mucous membranes are moist. Posterior pharynx clear of any exudate or lesions.Normal dentition.  Neck: normal, supple, no masses, no thyromegaly Respiratory: clear to auscultation bilaterally, no wheezing, no crackles. Normal respiratory effort. No accessory muscle use.  Cardiovascular: Regular rate and rhythm, no murmurs / rubs / gallops. No extremity edema. 2+ pedal pulses. No carotid bruits.  Abdomen: no tenderness, no masses palpated. No hepatosplenomegaly. Bowel sounds positive.  Musculoskeletal: no clubbing / cyanosis. No joint deformity upper extremities.  Right lower extremity in a knee immobilizer.  Good ROM upper extremities, no contractures. Normal muscle tone.  Skin: no rashes, lesions, ulcers. No induration Neurologic: CN 2-12 grossly intact. Sensation intact, DTR normal. Strength 5/5 in all 4.  Psychiatric: Normal judgment and insight. Alert and oriented x 3. Normal mood.     Labs on Admission: I have personally reviewed following labs and imaging studies  CBC: Recent Labs  Lab 07/20/17 1910  WBC 11.8*  NEUTROABS 6.5  HGB 13.9  HCT 42.1  MCV 89.0  PLT 093   Basic Metabolic Panel: Recent Labs  Lab 07/20/17 1910  NA 141  K 4.1  CL 105  CO2 23  GLUCOSE 241*  BUN 17  CREATININE 0.66  CALCIUM 9.5   Urine analysis:    Component Value Date/Time   BILIRUBINUR negative 08/22/2016 1122   KETONESUR trace (5) (A) 12/28/2014 1819   PROTEINUR negative 08/22/2016 1122     UROBILINOGEN negative (A) 08/22/2016 1122   NITRITE negative 08/22/2016 1122   LEUKOCYTESUR Negative 08/22/2016 1122    Radiological Exams on Admission: Dg Lumbar Spine Complete  Result Date: 07/20/2017 CLINICAL DATA:  Multiple falls today and over the last two weeks - pt had lumbar surgery 01.2019 - pt states inability to move right leg and little movement of left EXAM: LUMBAR SPINE - COMPLETE 4+ VIEW COMPARISON:  Lumbar MR 06/26/2017 FINDINGS: Normal alignment of the vertebral bodies. No loss vertebral body height or disc height. No subluxation. No pars defect. Gastric banding device noted. IMPRESSION: No acute findings lumbar spine by radiography. Electronically Signed   By: Suzy Bouchard M.D.   On: 07/20/2017 19:18   Dg Ankle Complete Right  Result Date: 07/20/2017 CLINICAL DATA:  Right ankle pain after multiple falls. EXAM: RIGHT ANKLE - COMPLETE 3+ VIEW COMPARISON:  None. FINDINGS: There is no evidence of fracture, dislocation, or joint effusion. There is no evidence of arthropathy or other focal bone abnormality. Soft tissues are unremarkable. IMPRESSION: Normal right ankle. Electronically Signed   By: Marijo Conception, M.D.   On: 07/20/2017 15:20   Mr Lumbar Spine Wo Contrast  Result Date: 07/21/2017 CLINICAL DATA:  52 y/o F; worsening left lower extremity weakness over the past few weeks progressive since prior  surgery. Sensation changes in the left lower extremity. EXAM: MRI LUMBAR SPINE WITHOUT CONTRAST TECHNIQUE: Multiplanar, multisequence MR imaging of the lumbar spine was performed. No intravenous contrast was administered. COMPARISON:  06/26/2017 lumbar spine MRI. FINDINGS: Segmentation:  Standard. Alignment:  Physiologic. Vertebrae: No loss of vertebral body height. No abnormal bone marrow signal, findings of diskitis, or suspicious bone lesion. L4-5 and L5-S1 disc desiccation and loss of disc space height. Conus medullaris and cauda equina: Conus extends to the L1 level. Conus and  cauda equina appear normal. Paraspinal and other soft tissues: Edema is present within the right-sided paraspinal muscles and subcutaneous fat at the L4-5 and L5-S1 levels consistent with postsurgical changes related to right-sided L4-5 hemilaminectomy. The pattern of edema is similar to the prior MRI of the lumbar spine. Disc levels: T12-L1: Stable left central small disc protrusion and annular fissure. No significant foraminal or canal stenosis. L1-2: No significant disc displacement, foraminal stenosis, or canal stenosis. L2-3: No significant disc displacement, foraminal stenosis, or canal stenosis. L3-4: Mild disc bulge and facet hypertrophy. No significant foraminal or canal stenosis. L4-5: Stable right central and subarticular disc protrusion narrowing the right greater than left lateral recesses with disc contact on the descending right L5 nerve root. Mild bilateral foraminal stenosis. L5-S1: Stable small central disc protrusion slightly eccentric to the left with mild stenosis of lateral recesses. Root. Combined with foraminal endplate marginal osteophytes and facet hypertrophy there is mild bilateral foraminal stenosis. IMPRESSION: 1. Stable edema present within right-sided paraspinal muscles and subcutaneous fat at L4-5 and L5-S1 levels paddle with postsurgical changes related to L4-5 laminectomy. No abnormal bone marrow signal. 2. Stable L4-5 right central and subarticular disc protrusion with contact on descending right L5 nerve root in lateral recess. 3. Stable mild bilateral L4-5 and L5-S1 foraminal stenosis. No high-grade foraminal or canal stenosis. Electronically Signed   By: Kristine Garbe M.D.   On: 07/21/2017 07:03   Dg Knee Complete 4 Views Left  Result Date: 07/20/2017 CLINICAL DATA:  Left knee pain after multiple falls. EXAM: LEFT KNEE - COMPLETE 4+ VIEW COMPARISON:  None. FINDINGS: No evidence of fracture, dislocation, or joint effusion. No evidence of arthropathy or other focal  bone abnormality. Soft tissues are unremarkable. IMPRESSION: Normal left knee. Electronically Signed   By: Marijo Conception, M.D.   On: 07/20/2017 15:22   Dg Knee Complete 4 Views Right  Result Date: 07/20/2017 CLINICAL DATA:  Right knee pain after multiple falls. EXAM: RIGHT KNEE - COMPLETE 4+ VIEW COMPARISON:  None. FINDINGS: No joint effusion is noted. Mildly displaced proximal right fibular fracture is noted. No fracture seen involving the femur or tibia. Joint spaces are intact. IMPRESSION: Mildly displaced proximal right fibular fracture. Electronically Signed   By: Marijo Conception, M.D.   On: 07/20/2017 15:19   Dg Hips Bilat W Or Wo Pelvis 2 Views  Result Date: 07/20/2017 CLINICAL DATA:  52 year old female with multiple recent falls. New onset lower extremity weakness. EXAM: DG HIP (WITH OR WITHOUT PELVIS) 2V BILAT COMPARISON:  Pelvis radiograph 12/29/2016. FINDINGS: Bone mineralization is within normal limits. The femoral heads are normally located. The pelvis appears intact. The SI joints today and sacral ala appear within normal limits. Negative visible bowel gas pattern. The proximal left femur is intact. The proximal right femur is intact. IMPRESSION: No acute fracture or dislocation identified about the bilateral hips or pelvis. Electronically Signed   By: Genevie Ann M.D.   On: 07/20/2017 19:18   Personally viewed  the patient's right knee films which shows a minimally displaced proximal right fibula fracture.   Assessment/Plan Principal Problem:   Unable to walk Active Problems:   Multiple falls   Displaced fracture of proximal end of right fibula   Uncontrolled type 2 diabetes mellitus with hyperglycemia, with long-term current use of insulin (HCC)   Essential hypertension   Lumbar disc disease with radiculopathy   Lumbar foraminal stenosis   Hyperlipidemia   Anxiety   Asthma   GERD (gastroesophageal reflux disease)   Morbid obesity ([place])     1.  Unable to walk: Patient is  unsafe to go home she is had difficulty with falls prior to her fracture I doubt that the fracture has contributed significantly to her inability to walk but it has now added pain as a additional component to difficulty with walking.  Patient is clearly unsafe to go home she is unable to even walk 50 feet to safely exit her home.  I will refer her to acute inpatient rehab I feel that with her age she is able to participate in intensive rehab and attempt to come more functional.  2.  Multiple falls: Patient now has sustained a fracture due to her falls will refer her to PT OT and inpatient rehab.  3.  Displaced fracture of proximal end of the right fibula: Orthopedics is being consulted by the ER we are awaiting further evaluation.  I doubt any surgical repair will be necessary patient may require a splint or a cast I am unsure as to which.  4.  Uncontrolled type 2 diabetes mellitus with hyperglycemia with long-term concurrent use of insulin: His blood glucoses have been elevated here we will continue her home medications we will start her on basal blood glucose monitoring protocol.  Will check hemoglobin A1c.  5.  Essential hypertension: Blood pressures have been out of control associated with the recent pain she has been having.  Will monitor blood pressure and treat as needed.  Continue home medications.  6.  Lumbar disc disease with radiculopathy: She has an L4-L5 disc protrusion with contact with the nerve which may be causing her significant pain that is on the right side.  She is sitting surgery with Dr. Trenton Gammon in the near future.  We will consult neurosurgery to find out what the plan is regarding this and if surgery sooner rather than later would be of benefit.  7.  Lumbar foraminal stenosis: Patient with an L4-L5 and L5-S1 foraminal stenosis.  This is further complicating her back issues continue with home regimen.  8.  Hyperlipidemia: Continue home medication regimen which is  atorvastatin.  9.  Anxiety continue home medications.  10.  Asthma: Patient has seasonal asthma but no other history of lung disease.  Continue with home allergy program.  11.  Gastroesophageal reflux disease: Continue proton pump inhibitor.  12.  Morbid obesity: Noted weight loss would significantly help her back pain.  He has in the past undergone lap band surgery.    DVT prophylaxis: Lovenox Code Status: Full code Family Communication: No family present at the time of evaluation. Disposition Plan: Hopefully inpatient rehab in 2 days Consults called: Dr. Charmian Muff from neurosurgery and on-call physician from [place] per ED Admission status: Observation   Lady Deutscher MD [facility] Pager [phone]  If 7PM-7AM, please contact night-coverage [email or web address] Password [email or web address]  07/21/2017, 11:30 AM

## 2017-07-21 NOTE — Progress Notes (Signed)
07/21/17 1240  PT Visit Information  Last PT Received On 07/21/17  Assistance Needed +2  PT/OT/SLP Co-Evaluation/Treatment Yes  Reason for Co-Treatment Complexity of the patient's impairments (multi-system involvement);To address functional/ADL transfers  PT goals addressed during session Mobility/safety with mobility;Balance  History of Present Illness This 52 y.o. female brought to ED after sustaining multiple falls due to worsening LE weakness.  x - ray showed Rt fibular fx.  MRI of lumbar spine showed stable edema present within the Rt sided paraspinal muscles and sucutaneous fat at L4-5 and L5-S1.   PMH:   DM, sacral fx due to fall 9/18 with subsequent  LE weakness, h/o parathesias, morbid obesity, DM, anxiety, recent back surgery.   Precautions  Precautions Fall  Precaution Comments Pt with multiple falls on day of admission.   She also reports frequent falls at home since fall of 2018  Required Braces or Orthoses Knee Immobilizer - Right  Knee Immobilizer - Right On at all times  Restrictions  Weight Bearing Restrictions Yes  RLE Weight Bearing NWB  Other Position/Activity Restrictions Assumed NWB secondary to R fibula fx  Home Living  Family/patient expects to be discharged to: Private residence  Living Arrangements Spouse/significant other  Available Help at Discharge Family;Available 24 hours/day;Available PRN/intermittently  Type of Home Mobile home  Home Access Stairs to enter  Entrance Stairs-Number of Steps 4  Entrance Stairs-Rails Right;Left  Home Layout One level  Bathroom Shower/Tub Walk-in shower  Bathroom Toilet Handicapped height  Bathroom Accessibility Yes  Home Equipment Walker - 2 wheels;BSC;Shower seat;Grab bars - toilet;Grab bars - tub/shower;Hand held shower head;Wheelchair - manual  Prior Function  Level of Independence Needs assistance  Gait / Transfers Assistance Needed Pt reports she ambulated with RW, but has had multiple falls.   ADL's / Homemaking  Assistance Needed Pt reports she has required assist for LB ADLs due to LE weakness   Communication  Communication No difficulties  Pain Assessment  Pain Assessment Faces  Faces Pain Scale 8  Pain Location Rt knee and ankle (report Rt ankle is chronic)  Pain Descriptors / Indicators Aching;Grimacing;Guarding  Pain Intervention(s) Limited activity within patient's tolerance;Monitored during session;Repositioned  Cognition  Arousal/Alertness Awake/alert  Behavior During Therapy Anxious  Overall Cognitive Status Within Functional Limits for tasks assessed  General Comments Pt very anxious throughout eval.  Fluctuates between being tearful, to joking.  She was very fearful of falling   Upper Extremity Assessment  Upper Extremity Assessment Defer to OT evaluation  Lower Extremity Assessment  Lower Extremity Assessment RLE deficits/detail;LLE deficits/detail  RLE Deficits / Details RLE immobilized throughout session reports some weakness and required assist to move LE during supine<>sit, however, per OT, pt able to assist with lifting RLE back to bed in supine.   LLE Deficits / Details Able to perform partial heel slide. Pt reports increased weakness. Ankle ROM/strength WFL. Able to assist with LLE lift assist for return back to supine.   Cervical / Trunk Assessment  Cervical / Trunk Assessment Normal  Bed Mobility  Overal bed mobility Needs Assistance  Bed Mobility Supine to Sit;Sit to Supine  Supine to sit Mod assist;+2 for physical assistance;+2 for safety/equipment  Sit to supine Min assist;+2 for physical assistance;+2 for safety/equipment  General bed mobility comments Pt very anxious with movement - fear of falling.  She requires step by step cues and instruction.  She requires max A to move LEs off bed and c/o pain Rt ankle and knee.   She uses bedrail  heavily to lift trunk and requires assist to lift shoulders and scoot hips toward EOB.  When moving back to bed, pt able to lift bil. LEs  and place them on the bed with min A only to initiate movement.  She was able to scoot hips back onto bed and lower trunk with use of bedrails. She was able to lift both LEs ~2-3" off the bed to adjust the sheets   Transfers  General transfer comment unable to attempt due pain, weakness, and body habitus and pt's height.  ED bed does not lower adequately to safely attempt a stand, and pt indicates pain too severe   Balance  Overall balance assessment Needs assistance  Sitting-balance support Feet unsupported;Single extremity supported  Sitting balance-Leahy Scale Poor  Sitting balance - Comments requires UE support   General Comments  General comments (skin integrity, edema, etc.) spoke with RN and PA re: eval performance   PT - End of Session  Equipment Utilized During Treatment Right knee immobilizer  Activity Tolerance Patient limited by pain  Patient left in bed;with call bell/phone within reach  Nurse Communication Mobility status  PT Assessment  PT Recommendation/Assessment Patient needs continued PT services  PT Visit Diagnosis Pain;Unsteadiness on feet (R26.81);Repeated falls (R29.6);History of falling (Z91.81);Muscle weakness (generalized) (M62.81)  Pain - Right/Left Right  Pain - part of body Leg  PT Problem List Decreased strength;Decreased activity tolerance;Decreased balance;Decreased mobility;Decreased range of motion;Decreased knowledge of use of DME;Decreased knowledge of precautions;Pain  PT Plan  PT Frequency (ACUTE ONLY) Min 3X/week  PT Treatment/Interventions (ACUTE ONLY) DME instruction;Functional mobility training;Therapeutic activities;Therapeutic exercise;Balance training;Gait training;Patient/family education;Neuromuscular re-education  AM-PAC PT "6 Clicks" Daily Activity Outcome Measure  Difficulty turning over in bed (including adjusting bedclothes, sheets and blankets)? 1  Difficulty moving from lying on back to sitting on the side of the bed?  1  Difficulty  sitting down on and standing up from a chair with arms (e.g., wheelchair, bedside commode, etc,.)? 1  Help needed moving to and from a bed to chair (including a wheelchair)? 1  Help needed walking in hospital room? 1  Help needed climbing 3-5 steps with a railing?  1  6 Click Score 6  Mobility G Code  CN  PT Recommendation  Recommendations for Other Services Rehab consult  Follow Up Recommendations CIR;Supervision/Assistance - 24 hour  PT equipment None recommended by PT  Individuals Consulted  Consulted and Agree with Results and Recommendations Patient  Acute Rehab PT Goals  Patient Stated Goal to walk   PT Goal Formulation With patient  Time For Goal Achievement 08/04/17  Potential to Achieve Goals Good  PT Time Calculation  PT Start Time (ACUTE ONLY) 7124  PT Stop Time (ACUTE ONLY) 0859  PT Time Calculation (min) (ACUTE ONLY) 38 min  PT General Charges  $$ ACUTE PT VISIT 1 Visit  PT Evaluation  $PT Eval Moderate Complexity 1 Mod  Written Expression  Dominant Hand Right   Pt admitted secondary to problem above with deficits below. PT saw pt before orders discontinued, and PT aware. Pt currently requiring min to mod +2 for basic bed mobility tasks. Presenting with BLE weakness and RLE pain, however, mutliple inconsistencies noted throughout mobility. Pt very anxious throughout session given current mobility status at home. She has had multiple falls at home, and family is unable to safely assist her in the home environment. Feel she will need CIR level rehab to allow her to maximize safety and independence with ADLs, reduce risk of  falls and reduce risk of readmission. PT orders currently discontinued, however, will follow up acutely if PT reordered.   Leighton Ruff, PT, DPT  Acute Rehabilitation Services  Pager: [phone]

## 2017-07-21 NOTE — ED Notes (Addendum)
PT and OT at bedside

## 2017-07-22 ENCOUNTER — Encounter (HOSPITAL_COMMUNITY): Payer: Self-pay | Admitting: *Deleted

## 2017-07-22 DIAGNOSIS — R270 Ataxia, unspecified: Secondary | ICD-10-CM | POA: Diagnosis not present

## 2017-07-22 DIAGNOSIS — S82831D Other fracture of upper and lower end of right fibula, subsequent encounter for closed fracture with routine healing: Secondary | ICD-10-CM | POA: Diagnosis not present

## 2017-07-22 DIAGNOSIS — E1169 Type 2 diabetes mellitus with other specified complication: Secondary | ICD-10-CM

## 2017-07-22 DIAGNOSIS — I10 Essential (primary) hypertension: Secondary | ICD-10-CM | POA: Diagnosis not present

## 2017-07-22 DIAGNOSIS — R262 Difficulty in walking, not elsewhere classified: Secondary | ICD-10-CM | POA: Diagnosis not present

## 2017-07-22 DIAGNOSIS — R296 Repeated falls: Secondary | ICD-10-CM

## 2017-07-22 DIAGNOSIS — S82831A Other fracture of upper and lower end of right fibula, initial encounter for closed fracture: Secondary | ICD-10-CM | POA: Diagnosis not present

## 2017-07-22 DIAGNOSIS — E669 Obesity, unspecified: Secondary | ICD-10-CM

## 2017-07-22 DIAGNOSIS — G43909 Migraine, unspecified, not intractable, without status migrainosus: Secondary | ICD-10-CM

## 2017-07-22 DIAGNOSIS — G894 Chronic pain syndrome: Secondary | ICD-10-CM

## 2017-07-22 DIAGNOSIS — M5116 Intervertebral disc disorders with radiculopathy, lumbar region: Secondary | ICD-10-CM

## 2017-07-22 DIAGNOSIS — K219 Gastro-esophageal reflux disease without esophagitis: Secondary | ICD-10-CM

## 2017-07-22 DIAGNOSIS — M9983 Other biomechanical lesions of lumbar region: Secondary | ICD-10-CM

## 2017-07-22 DIAGNOSIS — J45909 Unspecified asthma, uncomplicated: Secondary | ICD-10-CM

## 2017-07-22 DIAGNOSIS — Z794 Long term (current) use of insulin: Secondary | ICD-10-CM

## 2017-07-22 DIAGNOSIS — E1165 Type 2 diabetes mellitus with hyperglycemia: Secondary | ICD-10-CM | POA: Diagnosis not present

## 2017-07-22 DIAGNOSIS — J4520 Mild intermittent asthma, uncomplicated: Secondary | ICD-10-CM

## 2017-07-22 DIAGNOSIS — R29898 Other symptoms and signs involving the musculoskeletal system: Secondary | ICD-10-CM | POA: Diagnosis not present

## 2017-07-22 DIAGNOSIS — F419 Anxiety disorder, unspecified: Secondary | ICD-10-CM | POA: Diagnosis not present

## 2017-07-22 LAB — CBC
HCT: 39.4 % (ref 36.0–46.0)
Hemoglobin: 12.2 g/dL (ref 12.0–15.0)
MCH: 28 pg (ref 26.0–34.0)
MCHC: 31 g/dL (ref 30.0–36.0)
MCV: 90.4 fL (ref 78.0–100.0)
PLATELETS: 269 10*3/uL (ref 150–400)
RBC: 4.36 MIL/uL (ref 3.87–5.11)
RDW: 14.6 % (ref 11.5–15.5)
WBC: 8 10*3/uL (ref 4.0–10.5)

## 2017-07-22 LAB — GLUCOSE, CAPILLARY
GLUCOSE-CAPILLARY: 241 mg/dL — AB (ref 65–99)
GLUCOSE-CAPILLARY: 304 mg/dL — AB (ref 65–99)
Glucose-Capillary: 210 mg/dL — ABNORMAL HIGH (ref 65–99)

## 2017-07-22 LAB — HIV ANTIBODY (ROUTINE TESTING W REFLEX): HIV Screen 4th Generation wRfx: NONREACTIVE

## 2017-07-22 MED ORDER — INSULIN ASPART 100 UNIT/ML ~~LOC~~ SOLN
0.0000 [IU] | Freq: Three times a day (TID) | SUBCUTANEOUS | Status: DC
  Administered 2017-07-22: 5 [IU] via SUBCUTANEOUS
  Administered 2017-07-23: 3 [IU] via SUBCUTANEOUS
  Administered 2017-07-23: 8 [IU] via SUBCUTANEOUS
  Administered 2017-07-23: 5 [IU] via SUBCUTANEOUS
  Administered 2017-07-24: 3 [IU] via SUBCUTANEOUS
  Administered 2017-07-24: 8 [IU] via SUBCUTANEOUS
  Administered 2017-07-24: 2 [IU] via SUBCUTANEOUS
  Administered 2017-07-25: 3 [IU] via SUBCUTANEOUS
  Administered 2017-07-25: 2 [IU] via SUBCUTANEOUS
  Administered 2017-07-25: 3 [IU] via SUBCUTANEOUS

## 2017-07-22 MED ORDER — INSULIN ASPART 100 UNIT/ML ~~LOC~~ SOLN: 0.0000 [IU] | Freq: Every day | SUBCUTANEOUS | Status: DC

## 2017-07-22 MED ORDER — ACETAMINOPHEN 500 MG PO TABS
500.0000 mg | ORAL_TABLET | Freq: Four times a day (QID) | ORAL | Status: DC | PRN
  Administered 2017-07-22 – 2017-07-23 (×2): 500 mg via ORAL
  Filled 2017-07-22 (×2): qty 1

## 2017-07-22 MED ORDER — DULAGLUTIDE 0.75 MG/0.5ML ~~LOC~~ SOAJ: 0.5000 mL | SUBCUTANEOUS | Status: DC

## 2017-07-22 MED ORDER — INSULIN ASPART 100 UNIT/ML ~~LOC~~ SOLN
12.0000 [IU] | Freq: Three times a day (TID) | SUBCUTANEOUS | Status: DC
  Administered 2017-07-22 – 2017-07-23 (×4): 12 [IU] via SUBCUTANEOUS

## 2017-07-22 NOTE — Progress Notes (Addendum)
PROGRESS NOTE   [name]  BSJ:[id number]    DOB: 01/29/1966    DOA: 07/20/2017  PCP: Pleas Koch, NP   I have briefly reviewed patients previous medical records in [facility].  Brief Narrative:  52 year old married female, PMH of asthma, DM 2/IDDM, HTN, morbid obesity status post gastric banding, HLD, GERD, OSA, anxiety and depression, chronic back pain, underwent urgent lumbar spine surgery by Dr. Trenton Gammon due to RLE weakness and foot drop, supposed to have a shot to the back on 07/25/2017, presented to ED on 07/21/2017 due to significant right lower extremity pain after multiple falls at home, inadequate improvement of right lower extremity weakness post surgery, new onset weakness of left lower extremity for the last 3 to 4 weeks, noted to have right fibular fracture and admitted for observation and evaluation for rehab admission.  MRI of lumbar spine without acute findings.   Assessment & Plan:   Principal Problem:   Unable to walk Active Problems:   Uncontrolled type 2 diabetes mellitus with hyperglycemia, with long-term current use of insulin (HCC)   Hyperlipidemia   Essential hypertension   Anxiety   Asthma   GERD (gastroesophageal reflux disease)   Morbid obesity (HCC)   Lumbar disc disease with radiculopathy   Lumbar foraminal stenosis   Multiple falls   Displaced fracture of proximal end of right fibula   Lower extremity weakness, gait ataxia with recurrent falls: Etiology not clear but likely multifactorial related to deconditioning, morbid obesity, suspected peripheral neuropathy, others.  It was felt unsafe for patient to return home.  Admitted for observation.  PT evaluated and recommended CIR consult and their input is pending.  MRI of L-spine without acute findings, shows degenerative changes.  Case apparently was discussed with neurosurgeon on-call Dr. Christella Noa on admission and no acute neurosurgical issues were noted.  Will discuss with Dr. Trenton Gammon in  a.m.  Right proximal fibula fracture, minimally displaced: [facility] input appreciated and recommend that if she is having substantial pain a hinge knee brace could be utilized for pain control, otherwise no bracing or splinting is required for this fracture.  No additional treatment or work-up is needed and she can weight-bear as tolerated.  Pain controlled.  Uncontrolled type II DM with hyperglycemia: A1c: 9.5.  Continue home dose of Levemir 36 units twice daily.  Resume mealtime NovoLog at lower than home dose at 12 units 3 times daily.  Changed NovoLog SSI from resistant to moderate scale.  Hold oral hypoglycemics and Trulicity while hospitalized. Monitor closely and adjust insulins as needed.  Discussed with RN.  Essential hypertension: Controlled.  Continue HCTZ and losartan.  Hyperlipidemia: Continue atorvastatin.  Anxiety and depression: Stable.  Continue fluoxetine.  Morbid obesity/Body mass index is 44.09 kg/m.     DVT prophylaxis: Lovenox Code Status: Full Family Communication: None at bedside Disposition: To be determined pending rehab MD input   Consultants:  Rehab MD  Procedures:  Right lower extremity splint.  Antimicrobials:  None   Subjective: Patient interviewed and examined this morning along with RN.  Reports that her right lower extremity pain is controlled.  Reports 3 to 4-week history of left lower extremity weakness.  Denied sphincter problems.  ROS: As above.  Objective:  Vitals:   07/21/17 1615 07/21/17 2150 07/22/17 0558 07/22/17 1355  BP:  102/61 101/75 (!) 104/51  Pulse:  (!) 101 84 98  Resp:  18 18 20   Temp:  98.2 F (36.8 C) (!) 97.3 F (36.3  C) 97.8 F (36.6 C)  TempSrc:  Oral Oral Oral  SpO2:  95% 100% 95%  Weight: 127.7 kg (281 lb 8.4 oz)     Height: 5\' 7"  (1.702 m)       Examination:  General exam: Pleasant young female, moderately built and morbidly obese lying comfortably supine in bed. Respiratory system:  Clear to auscultation. Respiratory effort normal. Cardiovascular system: S1 & S2 heard, RRR. No JVD, murmurs, rubs, gallops or clicks. No pedal edema. Gastrointestinal system: Abdomen is nondistended, soft and nontender. No organomegaly or masses felt. Normal bowel sounds heard. Central nervous system: Alert and oriented. No focal neurological deficits. Extremities: Symmetric 5 x 5 power in UE's. Grade 2/5 power atleast in RLE, evaluation limited by pain.  Grade 3 x 5 power in left lower extremity proximally and 2 x 5 power distally.  Right lower extremity in splint. Skin: No rashes, lesions or ulcers Psychiatry: Judgement and insight appear normal. Mood & affect appropriate.     Data Reviewed: I have personally reviewed following labs and imaging studies  CBC: Recent Labs  Lab 07/20/17 1910 07/21/17 1514 07/22/17 0351  WBC 11.8* 8.7 8.0  NEUTROABS 6.5  --   --   HGB 13.9 13.4 12.2  HCT 42.1 41.7 39.4  MCV 89.0 89.1 90.4  PLT 310 264 664   Basic Metabolic Panel: Recent Labs  Lab 07/20/17 1910 07/21/17 1514  NA 141  --   K 4.1  --   CL 105  --   CO2 23  --   GLUCOSE 241*  --   BUN 17  --   CREATININE 0.66 0.74  CALCIUM 9.5  --    HbA1C: Recent Labs    07/21/17 1514  HGBA1C 9.5*   CBG: Recent Labs  Lab 07/21/17 1252 07/21/17 2149 07/22/17 0752 07/22/17 1152  GLUCAP 278* 340* 241* 304*    No results found for this or any previous visit (from the past 240 hour(s)).       Radiology Studies: Dg Lumbar Spine Complete  Result Date: 07/20/2017 CLINICAL DATA:  Multiple falls today and over the last two weeks - pt had lumbar surgery 01.2019 - pt states inability to move right leg and little movement of left EXAM: LUMBAR SPINE - COMPLETE 4+ VIEW COMPARISON:  Lumbar MR 06/26/2017 FINDINGS: Normal alignment of the vertebral bodies. No loss vertebral body height or disc height. No subluxation. No pars defect. Gastric banding device noted. IMPRESSION: No acute findings  lumbar spine by radiography. Electronically Signed   By: Suzy Bouchard M.D.   On: 07/20/2017 19:18   Dg Ankle Complete Right  Result Date: 07/20/2017 CLINICAL DATA:  Right ankle pain after multiple falls. EXAM: RIGHT ANKLE - COMPLETE 3+ VIEW COMPARISON:  None. FINDINGS: There is no evidence of fracture, dislocation, or joint effusion. There is no evidence of arthropathy or other focal bone abnormality. Soft tissues are unremarkable. IMPRESSION: Normal right ankle. Electronically Signed   By: Marijo Conception, M.D.   On: 07/20/2017 15:20   Mr Lumbar Spine Wo Contrast  Result Date: 07/21/2017 CLINICAL DATA:  52 y/o F; worsening left lower extremity weakness over the past few weeks progressive since prior surgery. Sensation changes in the left lower extremity. EXAM: MRI LUMBAR SPINE WITHOUT CONTRAST TECHNIQUE: Multiplanar, multisequence MR imaging of the lumbar spine was performed. No intravenous contrast was administered. COMPARISON:  06/26/2017 lumbar spine MRI. FINDINGS: Segmentation:  Standard. Alignment:  Physiologic. Vertebrae: No loss of vertebral body height. No abnormal  bone marrow signal, findings of diskitis, or suspicious bone lesion. L4-5 and L5-S1 disc desiccation and loss of disc space height. Conus medullaris and cauda equina: Conus extends to the L1 level. Conus and cauda equina appear normal. Paraspinal and other soft tissues: Edema is present within the right-sided paraspinal muscles and subcutaneous fat at the L4-5 and L5-S1 levels consistent with postsurgical changes related to right-sided L4-5 hemilaminectomy. The pattern of edema is similar to the prior MRI of the lumbar spine. Disc levels: T12-L1: Stable left central small disc protrusion and annular fissure. No significant foraminal or canal stenosis. L1-2: No significant disc displacement, foraminal stenosis, or canal stenosis. L2-3: No significant disc displacement, foraminal stenosis, or canal stenosis. L3-4: Mild disc bulge and  facet hypertrophy. No significant foraminal or canal stenosis. L4-5: Stable right central and subarticular disc protrusion narrowing the right greater than left lateral recesses with disc contact on the descending right L5 nerve root. Mild bilateral foraminal stenosis. L5-S1: Stable small central disc protrusion slightly eccentric to the left with mild stenosis of lateral recesses. Root. Combined with foraminal endplate marginal osteophytes and facet hypertrophy there is mild bilateral foraminal stenosis. IMPRESSION: 1. Stable edema present within right-sided paraspinal muscles and subcutaneous fat at L4-5 and L5-S1 levels paddle with postsurgical changes related to L4-5 laminectomy. No abnormal bone marrow signal. 2. Stable L4-5 right central and subarticular disc protrusion with contact on descending right L5 nerve root in lateral recess. 3. Stable mild bilateral L4-5 and L5-S1 foraminal stenosis. No high-grade foraminal or canal stenosis. Electronically Signed   By: Kristine Garbe M.D.   On: 07/21/2017 07:03   Dg Knee Complete 4 Views Left  Result Date: 07/20/2017 CLINICAL DATA:  Left knee pain after multiple falls. EXAM: LEFT KNEE - COMPLETE 4+ VIEW COMPARISON:  None. FINDINGS: No evidence of fracture, dislocation, or joint effusion. No evidence of arthropathy or other focal bone abnormality. Soft tissues are unremarkable. IMPRESSION: Normal left knee. Electronically Signed   By: Marijo Conception, M.D.   On: 07/20/2017 15:22   Dg Knee Complete 4 Views Right  Result Date: 07/20/2017 CLINICAL DATA:  Right knee pain after multiple falls. EXAM: RIGHT KNEE - COMPLETE 4+ VIEW COMPARISON:  None. FINDINGS: No joint effusion is noted. Mildly displaced proximal right fibular fracture is noted. No fracture seen involving the femur or tibia. Joint spaces are intact. IMPRESSION: Mildly displaced proximal right fibular fracture. Electronically Signed   By: Marijo Conception, M.D.   On: 07/20/2017 15:19   Dg  Hips Bilat W Or Wo Pelvis 2 Views  Result Date: 07/20/2017 CLINICAL DATA:  52 year old female with multiple recent falls. New onset lower extremity weakness. EXAM: DG HIP (WITH OR WITHOUT PELVIS) 2V BILAT COMPARISON:  Pelvis radiograph 12/29/2016. FINDINGS: Bone mineralization is within normal limits. The femoral heads are normally located. The pelvis appears intact. The SI joints today and sacral ala appear within normal limits. Negative visible bowel gas pattern. The proximal left femur is intact. The proximal right femur is intact. IMPRESSION: No acute fracture or dislocation identified about the bilateral hips or pelvis. Electronically Signed   By: Genevie Ann M.D.   On: 07/20/2017 19:18        Scheduled Meds: . atorvastatin  40 mg Oral Daily  . cyclobenzaprine  10 mg Oral TID  . Dulaglutide  0.5 mL Subcutaneous Weekly  . enoxaparin (LOVENOX) injection  60 mg Subcutaneous Q24H  . FLUoxetine  20 mg Oral Daily  . fluticasone  2 spray  Each Nare Daily  . gabapentin  300 mg Oral TID  . hydrochlorothiazide  12.5 mg Oral Daily  . insulin aspart  0-20 Units Subcutaneous TID WC  . insulin aspart  0-5 Units Subcutaneous QHS  . insulin detemir  36 Units Subcutaneous BID WC  . loratadine  10 mg Oral Daily  . losartan  50 mg Oral Daily  . naproxen  500 mg Oral BID WC   Continuous Infusions:   LOS: 0 days     Vernell Leep, MD, FACP, [facility]. [facility] Pager [phone]  If 7PM-7AM, please contact night-coverage [email or web address] Password [email or web address] 07/22/2017, 1:58 PM

## 2017-07-22 NOTE — Consult Note (Signed)
Physical Medicine and Rehabilitation Consult Reason for Consult: gait abnormality Referring Physician:  Dr. Evangeline Gula   HPI: [name] is a 52 y.o. female with past medical history of sleep apnea, diabetes mellitus type 2, hypothyroidism, GERD, morbid obesity, migraines, IBS, HTN, asthma, OA, anxiety, back pain status post surgery in January 2019 presented on 07/21/17 with gait abnormality. History taken from patient and chart review. Patient presented to the ED with fall and right lower extremity pain. She notes of progressive decline since her back surgery after her last fall she presented to the ED and was noted to have a proximal fibular fracture on the right. She also had a lumbar MRI, reviewed, which showed right lumbar disc bulge with nerve impingement and foraminal stenosis L4-L5. Neurosurgery was consult to and did not feel any acute intervention was necessary at this time. Order was consult and recommended conservative management of fibular fracture.Patient notes she was supposed to have an ESI later this week. Patient with associated gait abnormality. Hospital course further complicated by pain. Patient states she needed assistance with ADLs prior to admission, but no longer would like to have to depend on her husband.   Review of Systems  Neurological: Positive for sensory change, focal weakness and weakness.  All other systems reviewed and are negative.  Past Medical History:  Diagnosis Date  . Allergy   . Anemia   . Anxiety   . Arthritis   . Asthma   . Colon polyp   . Diabetes mellitus   . Edema   . Fatigue   . Hypertension   . IBS (irritable bowel syndrome)   . Lump in female breast   . Methicillin resistant Staphylococcus aureus in conditions classified elsewhere and of unspecified site   . Migraines   . Morbid obesity ([place])   . Night sweats   . Nonspecific abnormal results of thyroid function study   . Other B-complex deficiencies   . Paresthesias   .  Pure hypercholesterolemia   . Reflux   . Sacral fracture ([place])   . Sinus problem   . Symptomatic states associated with artificial menopause   . Thyroid disease   . Type II or unspecified type diabetes mellitus without mention of complication, not stated as uncontrolled   . Unspecified sleep apnea    Past Surgical History:  Procedure Laterality Date  . abcess removal  1997   MRSA  . ABDOMINAL HYSTERECTOMY  1997   complete in 1997, partial was in 1995  . carpal tunnel release r  07/03/13   [place]  . [place]  . CHOLECYSTECTOMY  03/1989  . COLONOSCOPY  03/21/2011   Normal.  [facility].  [name] KNEE SURGERY  2006  . LAPAROSCOPIC GASTRIC BANDING  05/2009  . TONSILLECTOMY AND ADENOIDECTOMY  1974  . TUBAL LIGATION     Family History  Problem Relation Age of Onset  . Diabetes Mother   . Heart disease Mother 77       CHF, CAD  . Other Mother        muscle disease  . Hypertension Mother   . Hyperlipidemia Mother   . Arthritis Mother        OA hip L s/p THR  . Diabetes Father   . Heart disease Father        AMI/CABG/valve replacement age 67.  . Stroke Father   . Diabetes Brother   . Heart disease Brother  stent age 76.  . Fibromyalgia Daughter   . Crohn's disease Daughter   . Hypertension Son   . Alzheimer's disease Maternal Grandmother   . Emphysema Maternal Grandfather   . Heart disease Paternal Grandmother   . Breast cancer Unknown        2 Aunts   Social History:  reports that she has never smoked. She has never used smokeless tobacco. She reports that she drinks alcohol. She reports that she does not use drugs. Allergies:  Allergies  Allergen Reactions  . Amoxicillin Shortness Of Breath and Rash  . Bee Venom Anaphylaxis  . Levofloxacin Shortness Of Breath and Rash  . Meloxicam Hives and Rash  . Vancomycin Anaphylaxis  . Sulfa Antibiotics Rash  . Ace Inhibitors Cough  . Codeine   . Meperidine Hcl   . Oxycodone-Acetaminophen   .  Penicillins Swelling and Rash    Has patient had a PCN reaction causing immediate rash, facial/tongue/throat swelling, SOB or lightheadedness with hypotension: Yes Has patient had a PCN reaction causing severe rash involving mucus membranes or skin necrosis: Yes Has patient had a PCN reaction that required hospitalization: No Has patient had a PCN reaction occurring within the last 10 years: No If all of the above answers are "NO", then may proceed with Cephalosporin use.   Flo Shanks [Gatifloxacin] Rash   Facility-Administered Medications Prior to Admission  Medication Dose Route Frequency Provider Last Rate Last Dose  . [DISCONTINUED] hepatitis B vac recombinant (RECOMBIVAX) injection 5 mcg  0.5 mL Intramuscular Once Wardell Honour, MD       Medications Prior to Admission  Medication Sig Dispense Refill  . acetaminophen (TYLENOL) 500 MG tablet Take 500 mg by mouth daily as needed for moderate pain.    [name] atorvastatin (LIPITOR) 40 MG tablet TAKE 1 TABLET BY MOUTH DAILY 90 tablet 3  . cyclobenzaprine (FLEXERIL) 10 MG tablet Take 10 mg by mouth 3 (three) times daily.  1  . Dulaglutide (TRULICITY) 1.61 WR/6.0AV SOPN Inject 0.5 ml once weekly for diabetes. 8 mL 0  . Fexofenadine HCl (ALLEGRA PO) Take by mouth.      [name] FLUoxetine (PROZAC) 20 MG capsule Take 1 capsule (20 mg total) by mouth daily. 180 capsule 1  . fluticasone (FLONASE) 50 MCG/ACT nasal spray Place 2 sprays into both nostrils daily. 16 g 11  . gabapentin (NEURONTIN) 300 MG capsule TAKE 1 CAPSULE BY MOUTH THREE TIMES A DAY (Patient taking differently: TAKE 1 CAPSULE (300 mg)  BY MOUTH THREE TIMES A DAY) 270 capsule 3  . glipiZIDE-metformin (METAGLIP) 2.5-500 MG tablet TAKE TWO (2) TABLETS BY MOUTH 2 TIMES DAILY BEFORE A MEAL. 360 tablet 1  . hydrochlorothiazide (HYDRODIURIL) 25 MG tablet TAKE 1/2 TABLET BY MOUTH EVERY DAY. (Patient taking differently: TAKE 1/2 TABLET  (12.5 mg) BY MOUTH EVERY DAY.) 45 tablet 1  .  HYDROcodone-acetaminophen (NORCO/VICODIN) 5-325 MG tablet Take 1 tablet by mouth 2 (two) times daily as needed for moderate pain.     [name] insulin aspart (NOVOLOG FLEXPEN) 100 UNIT/ML FlexPen Inject 18 Units into the skin 3 (three) times daily with meals. 15 mL 5  . LEVEMIR FLEXTOUCH 100 UNIT/ML Pen INJECT 35 UNITS EVERY MORNING AND 35 UNITS IN THE EVENING AS DIRECTED (Patient taking differently: INJECT 36 UNITS EVERY MORNING AND 36 UNITS IN THE EVENING AS DIRECTED) 15 mL 1  . levocetirizine (XYZAL) 5 MG tablet TAKE ONE TABLET BY MOUTH EVERY EVENING (Patient taking differently: TAKE ONE TABLET (5 mg)  BY MOUTH EVERY EVENING) 90 tablet 1  . losartan (COZAAR) 100 MG tablet TAKE 1/2 TABLET BY MOUTH EVERY DAY. (Patient taking differently: TAKE 1/2 TABLET (50 mg) BY MOUTH EVERY DAY.) 90 tablet 1  . Multiple Vitamin (MULTIVITAMIN PO) Take by mouth daily.      . naproxen (NAPROSYN) 500 MG tablet Take 1 tablet (500 mg total) by mouth 2 (two) times daily with a meal. 30 tablet 0  . ondansetron (ZOFRAN-ODT) 4 MG disintegrating tablet TAKE 1 TABLET BY MOUTH EVERY 8 HOURS AS NEEDED FOR NAUSEA AS DIRECTED. 20 tablet 0  . Blood Glucose Monitoring Suppl (ONE TOUCH ULTRA MINI) w/Device KIT Use as instructed to test blood sugar 5 times daily. 1 each 0  . Continuous Blood Gluc Sensor (FREESTYLE LIBRE 14 DAY SENSOR) MISC 1 each by Does not apply route every 14 (fourteen) days. 2 each 0  . glucose blood test strip ONE TOUCH ULTRA MINI Use as instructed to test blood sugar 5 times daily. 200 each 5  . Insulin Pen Needle (UNIFINE PENTIPS) 32G X 4 MM MISC Use as directed for insulin injection daily. 100 each 11  . Lancets (ONETOUCH ULTRASOFT) lancets Use as instructed to test blood sugar 3 times daily 100 each 5    Home: Home Living Family/patient expects to be discharged to:: Private residence Living Arrangements: Spouse/significant other Available Help at Discharge: Family, Available 24 hours/day, Available  PRN/intermittently Type of Home: Mobile home Home Access: Stairs to enter [employer] of Steps: 4 Entrance Stairs-Rails: Right, Left Home Layout: One level Bathroom Shower/Tub: Multimedia programmer: Handicapped height Bathroom Accessibility: Yes Home Equipment: Environmental consultant - 2 wheels, Bedside commode, Shower seat, Grab bars - toilet, Grab bars - tub/shower, Hand held shower head, Wheelchair - manual  Functional History: Prior Function Level of Independence: Needs assistance Gait / Transfers Assistance Needed: Pt reports she ambulated with RW, but has had multiple falls.  ADL's / Homemaking Assistance Needed: Pt reports she has required assist for LB ADLs due to LE weakness  Functional Status:  Mobility: Bed Mobility Overal bed mobility: Needs Assistance Bed Mobility: Supine to Sit, Sit to Supine Supine to sit: Mod assist, +2 for physical assistance, +2 for safety/equipment Sit to supine: Min assist, +2 for physical assistance, +2 for safety/equipment General bed mobility comments: Pt very anxious with movement - fear of falling.  She requires step by step cues and instruction.  She requires max A to move LEs off bed and c/o pain Rt ankle and knee.   She uses bedrail heavily to lift trunk and requires assist to lift shoulders and scoot hips toward EOB.  When moving back to bed, pt able to lift bil. LEs and place them on the bed with min A only to initiate movement.  She was able to scoot hips back onto bed and lower trunk with use of bedrails. She was able to lift both LEs ~2-3" off the bed to adjust the sheets  Transfers Overall transfer level: Needs assistance General transfer comment: unable to attempt due pain, weakness, and body habitus and pt's height.  ED bed does not lower adequately to safely attempt a stand, and pt indicates pain too severe       ADL: ADL Overall ADL's : Needs assistance/impaired Eating/Feeding: Independent Grooming: Wash/dry hands, Wash/dry  face, Oral care, Brushing hair, Set up, Sitting Upper Body Bathing: Set up, Sitting, Bed level Lower Body Bathing: Maximal assistance, Bed level Upper Body Dressing : Minimal assistance, Sitting, Bed level  Lower Body Dressing: Total assistance, Bed level Toilet Transfer: Total assistance Toilet Transfer Details (indicate cue type and reason): unable to safely attempt  Toileting- Clothing Manipulation and Hygiene: Total assistance, Bed level Functional mobility during ADLs: Moderate assistance, +2 for physical assistance, +2 for safety/equipment(bed mobility only )  Cognition: Cognition Overall Cognitive Status: Within Functional Limits for tasks assessed Orientation Level: Oriented X4 Cognition Arousal/Alertness: Awake/alert Behavior During Therapy: Anxious Overall Cognitive Status: Within Functional Limits for tasks assessed General Comments: Pt very anxious throughout eval.  Fluctuates between being tearful, to joking.  She was very fearful of falling   Blood pressure (!) 104/51, pulse 98, temperature 97.8 F (36.6 C), temperature source Oral, resp. rate 20, height '5\' 7"'$  (1.702 m), weight 127.7 kg (281 lb 8.4 oz), SpO2 95 %. Physical Exam  Vitals reviewed. Constitutional: She is oriented to person, place, and time. She appears well-developed.  Morbidly obese  HENT:  Head: Normocephalic and atraumatic.  Eyes: EOM are normal. Right eye exhibits no discharge. Left eye exhibits no discharge.  Neck: Normal range of motion. Neck supple.  Cardiovascular: Normal rate and regular rhythm.  Respiratory: Effort normal and breath sounds normal.  GI: Soft.  Musculoskeletal:  No edema or tenderness in extremities  Neurological: She is alert and oriented to person, place, and time.  Motor: Bilateral upper extremities: 5/5 proximal distal Left lower extremity: Hip flexion 2/5, knee extension 2/5, ankle dorsiflexion 4/5 Right lower extremity: Hip flexion 2/5, knee extension 2/5, ankle  dorsiflexion 3/5 Sensation diminished to light touch right foot  Skin: Skin is warm and dry.  Psychiatric: She has a normal mood and affect. Her behavior is normal. Thought content normal.    Results for orders placed or performed during the hospital encounter of 07/20/17 (from the past 24 hour(s))  Glucose, capillary     Status: Abnormal   Collection Time: 07/21/17  9:49 PM  Result Value Ref Range   Glucose-Capillary 340 (H) 65 - 99 mg/dL  CBC     Status: None   Collection Time: 07/22/17  3:51 AM  Result Value Ref Range   WBC 8.0 4.0 - 10.5 K/uL   RBC 4.36 3.87 - 5.11 MIL/uL   Hemoglobin 12.2 12.0 - 15.0 g/dL   HCT 39.4 36.0 - 46.0 %   MCV 90.4 78.0 - 100.0 fL   MCH 28.0 26.0 - 34.0 pg   MCHC 31.0 30.0 - 36.0 g/dL   RDW 14.6 11.5 - 15.5 %   Platelets 269 150 - 400 K/uL  Glucose, capillary     Status: Abnormal   Collection Time: 07/22/17  7:52 AM  Result Value Ref Range   Glucose-Capillary 241 (H) 65 - 99 mg/dL  Glucose, capillary     Status: Abnormal   Collection Time: 07/22/17 11:52 AM  Result Value Ref Range   Glucose-Capillary 304 (H) 65 - 99 mg/dL   Dg Lumbar Spine Complete  Result Date: 07/20/2017 CLINICAL DATA:  Multiple falls today and over the last two weeks - pt had lumbar surgery 01.2019 - pt states inability to move right leg and little movement of left EXAM: LUMBAR SPINE - COMPLETE 4+ VIEW COMPARISON:  Lumbar MR 06/26/2017 FINDINGS: Normal alignment of the vertebral bodies. No loss vertebral body height or disc height. No subluxation. No pars defect. Gastric banding device noted. IMPRESSION: No acute findings lumbar spine by radiography. Electronically Signed   By: Suzy Bouchard M.D.   On: 07/20/2017 19:18   Mr Lumbar Spine Wo Contrast  Result Date: 07/21/2017 CLINICAL DATA:  52 y/o F; worsening left lower extremity weakness over the past few weeks progressive since prior surgery. Sensation changes in the left lower extremity. EXAM: MRI LUMBAR SPINE WITHOUT  CONTRAST TECHNIQUE: Multiplanar, multisequence MR imaging of the lumbar spine was performed. No intravenous contrast was administered. COMPARISON:  06/26/2017 lumbar spine MRI. FINDINGS: Segmentation:  Standard. Alignment:  Physiologic. Vertebrae: No loss of vertebral body height. No abnormal bone marrow signal, findings of diskitis, or suspicious bone lesion. L4-5 and L5-S1 disc desiccation and loss of disc space height. Conus medullaris and cauda equina: Conus extends to the L1 level. Conus and cauda equina appear normal. Paraspinal and other soft tissues: Edema is present within the right-sided paraspinal muscles and subcutaneous fat at the L4-5 and L5-S1 levels consistent with postsurgical changes related to right-sided L4-5 hemilaminectomy. The pattern of edema is similar to the prior MRI of the lumbar spine. Disc levels: T12-L1: Stable left central small disc protrusion and annular fissure. No significant foraminal or canal stenosis. L1-2: No significant disc displacement, foraminal stenosis, or canal stenosis. L2-3: No significant disc displacement, foraminal stenosis, or canal stenosis. L3-4: Mild disc bulge and facet hypertrophy. No significant foraminal or canal stenosis. L4-5: Stable right central and subarticular disc protrusion narrowing the right greater than left lateral recesses with disc contact on the descending right L5 nerve root. Mild bilateral foraminal stenosis. L5-S1: Stable small central disc protrusion slightly eccentric to the left with mild stenosis of lateral recesses. Root. Combined with foraminal endplate marginal osteophytes and facet hypertrophy there is mild bilateral foraminal stenosis. IMPRESSION: 1. Stable edema present within right-sided paraspinal muscles and subcutaneous fat at L4-5 and L5-S1 levels paddle with postsurgical changes related to L4-5 laminectomy. No abnormal bone marrow signal. 2. Stable L4-5 right central and subarticular disc protrusion with contact on  descending right L5 nerve root in lateral recess. 3. Stable mild bilateral L4-5 and L5-S1 foraminal stenosis. No high-grade foraminal or canal stenosis. Electronically Signed   By: Kristine Garbe M.D.   On: 07/21/2017 07:03   Dg Hips Bilat W Or Wo Pelvis 2 Views  Result Date: 07/20/2017 CLINICAL DATA:  52 year old female with multiple recent falls. New onset lower extremity weakness. EXAM: DG HIP (WITH OR WITHOUT PELVIS) 2V BILAT COMPARISON:  Pelvis radiograph 12/29/2016. FINDINGS: Bone mineralization is within normal limits. The femoral heads are normally located. The pelvis appears intact. The SI joints today and sacral ala appear within normal limits. Negative visible bowel gas pattern. The proximal left femur is intact. The proximal right femur is intact. IMPRESSION: No acute fracture or dislocation identified about the bilateral hips or pelvis. Electronically Signed   By: Genevie Ann M.D.   On: 07/20/2017 19:18    Assessment/Plan: Diagnosis: gait abnormality Labs and images independently reviewed.  Records reviewed and summated above.  1. Does the need for close, 24 hr/day medical supervision in concert with the patient's rehab needs make it unreasonable for this patient to be served in a less intensive setting? No  2. Co-Morbidities requiring supervision/potential complications: sleep apnea, diabetes mellitus type 2 (Monitor in accordance with exercise and adjust meds as necessary), hypothyroidism, GERD, morbid obesity (Encourage weight loss), migraines (ensure pain does not limit therapies), IBS, HTN (monitor and provide prns in accordance with increased physical exertion and pain), asthma (monitor her respiratory rate and O2 sats with increased physical activity), OA, anxiety (ensure anxiety and resulting apprehension do not limit functional progress; consider prn medications if warranted), chronic back pain (  Biofeedback training with therapies to help reduce reliance on opiate pain  medications, monitor pain control during therapies, and sedation at rest and titrate to maximum efficacy to ensure participation and gains in therapies) 3. Due to safety, disease management, pain management and patient education, does the patient require 24 hr/day rehab nursing? No 4. Does the patient require coordinated care of a physician, rehab nurse, PT (1-2 hrs/day, 5 days/week) and OT (1-2 hrs/day, 5 days/week) to address physical and functional deficits in the context of the above medical diagnosis(es)? Potentially Addressing deficits in the following areas: balance, endurance, locomotion, strength, transferring, bathing, dressing, toileting and psychosocial support 5. Can the patient actively participate in an intensive therapy program of at least 3 hrs of therapy per day at least 5 days per week? Yes 6. The potential for patient to make measurable gains while on inpatient rehab is good and fair 7. Anticipated functional outcomes upon discharge from inpatient rehab are min assist and mod assist  with PT, min assist and mod assist with OT, n/a with SLP. 8. Estimated rehab length of stay to reach the above functional goals is: NA 9. Anticipated D/C setting: Other 10. Anticipated post D/C treatments: SNF 11. Overall Rehab/Functional Prognosis: good  RECOMMENDATIONS: This patient's condition is appropriate for continued rehabilitative care in the following setting: Patient does not have a medical need or diagnosis to warrant IRF. Her deficits appear to be chronic and progressive in nature and without any change in management will have limited improvement. Believe she should follow-up for ESIs, which are performed in an outpatient setting. Recommend SNF for therapies (wearin she may also go to have ESIs) with PM&R outpatient follow-up. Patient has agreed to participate in recommended program. Potentially Note that insurance prior authorization may be required for reimbursement for recommended  care.  Comment: Rehab Admissions Coordinator to follow up.  Delice Lesch, MD, ABPMR 07/22/2017

## 2017-07-23 ENCOUNTER — Other Ambulatory Visit: Payer: Self-pay | Admitting: Primary Care

## 2017-07-23 DIAGNOSIS — I10 Essential (primary) hypertension: Secondary | ICD-10-CM | POA: Diagnosis not present

## 2017-07-23 DIAGNOSIS — S82831A Other fracture of upper and lower end of right fibula, initial encounter for closed fracture: Secondary | ICD-10-CM | POA: Diagnosis not present

## 2017-07-23 DIAGNOSIS — E1169 Type 2 diabetes mellitus with other specified complication: Secondary | ICD-10-CM | POA: Diagnosis not present

## 2017-07-23 DIAGNOSIS — E119 Type 2 diabetes mellitus without complications: Secondary | ICD-10-CM

## 2017-07-23 DIAGNOSIS — R262 Difficulty in walking, not elsewhere classified: Secondary | ICD-10-CM | POA: Diagnosis not present

## 2017-07-23 DIAGNOSIS — Z794 Long term (current) use of insulin: Principal | ICD-10-CM

## 2017-07-23 LAB — GLUCOSE, CAPILLARY
GLUCOSE-CAPILLARY: 190 mg/dL — AB (ref 65–99)
Glucose-Capillary: 264 mg/dL — ABNORMAL HIGH (ref 65–99)
Glucose-Capillary: 260 mg/dL — ABNORMAL HIGH (ref 65–99)
Glucose-Capillary: 227 mg/dL — ABNORMAL HIGH (ref 65–99)
Glucose-Capillary: 179 mg/dL — ABNORMAL HIGH (ref 65–99)
Glucose-Capillary: 290 mg/dL — ABNORMAL HIGH (ref 65–99)

## 2017-07-23 MED ORDER — INSULIN ASPART 100 UNIT/ML ~~LOC~~ SOLN
14.0000 [IU] | Freq: Three times a day (TID) | SUBCUTANEOUS | Status: DC
  Administered 2017-07-24 – 2017-07-25 (×6): 14 [IU] via SUBCUTANEOUS

## 2017-07-23 NOTE — Telephone Encounter (Signed)
Received refill request electronically Last office visit 05/28/17 See recent ED visit Refill request does not match medication list

## 2017-07-23 NOTE — NC FL2 (Signed)
[place] LEVEL OF CARE SCREENING TOOL     IDENTIFICATION  Patient Name: [name] Birthdate: 05/30/1965 Sex: female Admission Date (Current Location): 07/20/2017  [facility] and [place] Number:  Herbalist and Address:  [facility] [place] [address] [place], [place] [address]      Provider Number: [id number]  Attending Physician Name and Address:  Modena Jansky, MD  Relative Name and Phone Number:  Antony Haste, spouse, [phone]    Current Level of Care: Hospital Recommended Level of Care: [place] Prior Approval Number:    Date Approved/Denied:   PASRR Number: [id number]  Discharge Plan: SNF    Current Diagnoses: Patient Active Problem List   Diagnosis Date Noted  . Diabetes mellitus type 2 in obese ([place])   . Migraine without status migrainosus, not intractable   . Chronic pain syndrome   . Lumbar disc disease with radiculopathy 07/21/2017  . Lumbar foraminal stenosis 07/21/2017  . Unable to walk 07/21/2017  . Multiple falls 07/21/2017  . Displaced fracture of proximal end of right fibula 07/21/2017  . Weakness of right lower extremity 05/28/2017  . Right lower quadrant abdominal pain 08/22/2016  . Abnormal TSH 01/10/2016  . Morbid obesity ([place]) 01/12/2014  . Routine general medical examination at a health care facility 10/28/2012  . GERD (gastroesophageal reflux disease) 10/28/2012  . Anxiety 12/20/2011  . Allergic rhinitis 12/20/2011  . Asthma 12/20/2011  . Lapband APL with HH repair 03/31/2011  . Uncontrolled type 2 diabetes mellitus with hyperglycemia, with long-term current use of insulin ([place]) 06/30/2007  . Hyperlipidemia 06/30/2007  . Essential hypertension 06/27/2007    Orientation RESPIRATION BLADDER Height & Weight     Time, Situation, Place, Self  Normal Continent, External catheter Weight: 127.7 kg (281 lb 8.4 oz) Height:  5\' 7"  (170.2 cm)  BEHAVIORAL SYMPTOMS/MOOD NEUROLOGICAL  BOWEL NUTRITION STATUS      Continent Diet(Please see DC Summary)  AMBULATORY STATUS COMMUNICATION OF NEEDS Skin   Extensive Assist Verbally Normal                       Personal Care Assistance Level of Assistance  Bathing, Feeding, Dressing Bathing Assistance: Limited assistance Feeding assistance: Independent Dressing Assistance: Limited assistance     Functional Limitations Info  Sight, Hearing, Speech Sight Info: Adequate Hearing Info: Adequate Speech Info: Adequate    SPECIAL CARE FACTORS FREQUENCY  PT (By licensed PT), OT (By licensed OT)     PT Frequency: 5x/week OT Frequency: 3x/week            Contractures      Additional Factors Info  Code Status, Allergies, Psychotropic, Insulin Sliding Scale Code Status Info: Full Allergies Info: Amoxicillin, Bee Venom, Levofloxacin, Meloxicam, Vancomycin, Sulfa Antibiotics, Ace Inhibitors, Codeine, Meperidine Hcl, Oxycodone-acetaminophen, Penicillins, Tequin Gatifloxacin Psychotropic Info: Prozac Insulin Sliding Scale Info: 3x daily with meals and at bedtime       Current Medications (07/23/2017):  This is the current hospital active medication list Current Facility-Administered Medications  Medication Dose Route Frequency Provider Last Rate Last Dose  . acetaminophen (TYLENOL) tablet 500 mg  500 mg Oral Q6H PRN Modena Jansky, MD   500 mg at 07/22/17 2103  . atorvastatin (LIPITOR) tablet 40 mg  40 mg Oral Daily Lady Deutscher, MD   40 mg at 07/22/17 7371  . cyclobenzaprine (FLEXERIL) tablet 10 mg  10 mg Oral TID Lady Deutscher, MD  10 mg at 07/22/17 2103  . enoxaparin (LOVENOX) injection 60 mg  60 mg Subcutaneous Q24H Lady Deutscher, MD   60 mg at 07/22/17 1548  . FLUoxetine (PROZAC) capsule 20 mg  20 mg Oral Daily Lady Deutscher, MD   20 mg at 07/22/17 1194  . fluticasone (FLONASE) 50 MCG/ACT nasal spray 2 spray  2 spray Each Nare Daily Lady Deutscher, MD   2 spray at 07/22/17 [phone]  .  gabapentin (NEURONTIN) capsule 300 mg  300 mg Oral TID Lady Deutscher, MD   300 mg at 07/22/17 2103  . hydrochlorothiazide (HYDRODIURIL) tablet 12.5 mg  12.5 mg Oral Daily Lady Deutscher, MD   12.5 mg at 07/22/17 8144  . HYDROcodone-acetaminophen (NORCO/VICODIN) 5-325 MG per tablet 1 tablet  1 tablet Oral BID PRN Lady Deutscher, MD   1 tablet at 07/22/17 1724  . insulin aspart (novoLOG) injection 0-15 Units  0-15 Units Subcutaneous TID WC Modena Jansky, MD   5 Units at 07/22/17 1806  . insulin aspart (novoLOG) injection 0-5 Units  0-5 Units Subcutaneous QHS Hongalgi, Anand D, MD      . insulin aspart (novoLOG) injection 12 Units  12 Units Subcutaneous TID WC Modena Jansky, MD   12 Units at 07/22/17 1805  . insulin detemir (LEVEMIR) injection 36 Units  36 Units Subcutaneous BID WC Jalene Mullet, RPH   36 Units at 07/22/17 1725  . loratadine (CLARITIN) tablet 10 mg  10 mg Oral Daily Jalene Mullet, RPH   10 mg at 07/22/17 8185  . losartan (COZAAR) tablet 50 mg  50 mg Oral Daily Lady Deutscher, MD   50 mg at 07/22/17 6314  . naproxen (NAPROSYN) tablet 500 mg  500 mg Oral BID WC Lady Deutscher, MD   500 mg at 07/22/17 1724  . ondansetron (ZOFRAN-ODT) disintegrating tablet 8 mg  8 mg Oral Q8H PRN Lady Deutscher, MD      . polyethylene glycol (MIRALAX / GLYCOLAX) packet 17 g  17 g Oral Daily PRN Lady Deutscher, MD         Discharge Medications: Please see discharge summary for a list of discharge medications.  Relevant Imaging Results:  Relevant Lab Results:   Additional Information SSN: [place], [place]

## 2017-07-23 NOTE — Progress Notes (Signed)
Per CIR, patient does not qualify. CSW asked Isaias Cowman to start [facility] authorization.   Percell Locus Victorio Creeden LCSW [phone]

## 2017-07-23 NOTE — Progress Notes (Signed)
PROGRESS NOTE   [name]  DXI:[id number]    DOB: 08-Jan-1966    DOA: 07/20/2017  PCP: Pleas Koch, NP   I have briefly reviewed patients previous medical records in [facility].  Brief Narrative:  52 year old married female, PMH of asthma, DM 2/IDDM, HTN, morbid obesity status post gastric banding, HLD, GERD, OSA, anxiety and depression, chronic back pain, underwent urgent lumbar spine surgery by Dr. Trenton Gammon due to RLE weakness and foot drop, supposed to have a shot to the back on 07/25/2017, presented to ED on 07/21/2017 due to significant right lower extremity pain after multiple falls at home, inadequate improvement of right lower extremity weakness post surgery, new onset weakness of left lower extremity for the last 3 to 4 weeks, noted to have right fibular fracture and admitted for observation and evaluation for rehab admission.  MRI of lumbar spine without acute findings.   Assessment & Plan:   Principal Problem:   Unable to walk Active Problems:   Uncontrolled type 2 diabetes mellitus with hyperglycemia, with long-term current use of insulin (HCC)   Hyperlipidemia   Essential hypertension   Anxiety   Asthma   GERD (gastroesophageal reflux disease)   Morbid obesity (HCC)   Lumbar disc disease with radiculopathy   Lumbar foraminal stenosis   Multiple falls   Displaced fracture of proximal end of right fibula   Diabetes mellitus type 2 in obese (HCC)   Migraine without status migrainosus, not intractable   Chronic pain syndrome   Lower extremity weakness, gait ataxia with recurrent falls: Etiology not clear but likely multifactorial related to deconditioning, morbid obesity, suspected peripheral neuropathy, others.  It was felt unsafe for patient to return home.  Admitted for observation.  PT evaluated and recommended CIR consult but not felt appropriate for inpatient rehab.  MRI of L-spine without acute findings, shows degenerative changes.  Case apparently was  discussed with neurosurgeon on-call Dr. Christella Noa on admission and no acute neurosurgical issues were noted.  I discussed with Dr. Trenton Gammon who will kindly assess patient in the hospital and make any needed recommendations.  Discussed with clinical social work for possible SNF placement.  Right proximal fibula fracture, minimally displaced: [place] [facility] input appreciated and recommend that if she is having substantial pain a hinge knee brace could be utilized for pain control, otherwise no bracing or splinting is required for this fracture.  No additional treatment or work-up is needed and she can weight-bear as tolerated.  Pain control.  Uncontrolled type II DM with hyperglycemia: A1c: 9.5.  Continue home dose of Levemir 36 units twice daily.  Resume mealtime NovoLog at lower than home dose at 12 units 3 times daily.  Changed NovoLog SSI from resistant to moderate scale.  Hold oral hypoglycemics and Trulicity while hospitalized. Monitor closely and adjust insulins as needed.  CBGs mildly uncontrolled and fluctuating.  Increase mealtime NovoLog to 14 units.  Essential hypertension: Controlled.  Continue HCTZ and losartan.  Hyperlipidemia: Continue atorvastatin.  Anxiety and depression: Stable.  Continue fluoxetine.  Morbid obesity/Body mass index is 44.09 kg/m.     DVT prophylaxis: Lovenox Code Status: Full Family Communication: Discussed with family/friend at bedside. Disposition: DC to SNF when bed available.   Consultants:  Rehab MD  Procedures:  Right lower extremity splint.  Antimicrobials:  None   Subjective: Reports some mild right leg pain.  Denies any other complaints.  ROS: As above.  Objective:  Vitals:   07/22/17 0558 07/22/17 1355 07/22/17 2209 07/23/17  0506  BP: 101/75 (!) 104/51 (!) 95/45 131/70  Pulse: 84 98 91 81  Resp: 18 20 18 18   Temp: (!) 97.3 F (36.3 C) 97.8 F (36.6 C) 98.5 F (36.9 C) 97.6 F (36.4 C)  TempSrc: Oral Oral Oral Oral  SpO2:  100% 95% 100% 99%  Weight:      Height:        Examination:  General exam: Pleasant young female, moderately built and morbidly obese lying comfortably supine in bed. Respiratory system: Clear to auscultation. Respiratory effort normal.  Stable. Cardiovascular system: S1 & S2 heard, RRR. No JVD, murmurs, rubs, gallops or clicks. No pedal edema.  Stable. Gastrointestinal system: Abdomen is nondistended, soft and nontender. No organomegaly or masses felt. Normal bowel sounds heard. Central nervous system: Alert and oriented. No focal neurological deficits. Extremities: Symmetric 5 x 5 power in UE's. Grade 2/5 power atleast in RLE, evaluation limited by pain.  Grade 3 x 5 power in left lower extremity proximally and 2 x 5 power distally.  Right lower extremity in splint.  Stable. Skin: No rashes, lesions or ulcers Psychiatry: Judgement and insight appear normal. Mood & affect appropriate.     Data Reviewed: I have personally reviewed following labs and imaging studies  CBC: Recent Labs  Lab 07/20/17 1910 07/21/17 1514 07/22/17 0351  WBC 11.8* 8.7 8.0  NEUTROABS 6.5  --   --   HGB 13.9 13.4 12.2  HCT 42.1 41.7 39.4  MCV 89.0 89.1 90.4  PLT 310 264 510   Basic Metabolic Panel: Recent Labs  Lab 07/20/17 1910 07/21/17 1514  NA 141  --   K 4.1  --   CL 105  --   CO2 23  --   GLUCOSE 241*  --   BUN 17  --   CREATININE 0.66 0.74  CALCIUM 9.5  --    HbA1C: Recent Labs    07/21/17 1514  HGBA1C 9.5*   CBG: Recent Labs  Lab 07/22/17 1714 07/22/17 2227 07/23/17 0829 07/23/17 1219 07/23/17 1736  GLUCAP 260* 210* 227* 179* 290*    No results found for this or any previous visit (from the past 240 hour(s)).       Radiology Studies: No results found.      Scheduled Meds: . atorvastatin  40 mg Oral Daily  . cyclobenzaprine  10 mg Oral TID  . enoxaparin (LOVENOX) injection  60 mg Subcutaneous Q24H  . FLUoxetine  20 mg Oral Daily  . fluticasone  2 spray  Each Nare Daily  . gabapentin  300 mg Oral TID  . hydrochlorothiazide  12.5 mg Oral Daily  . insulin aspart  0-15 Units Subcutaneous TID WC  . insulin aspart  0-5 Units Subcutaneous QHS  . insulin aspart  12 Units Subcutaneous TID WC  . insulin detemir  36 Units Subcutaneous BID WC  . loratadine  10 mg Oral Daily  . losartan  50 mg Oral Daily  . naproxen  500 mg Oral BID WC   Continuous Infusions:   LOS: 0 days     Vernell Leep, MD, FACP, [facility]. [facility] Pager [phone]  If 7PM-7AM, please contact night-coverage [email or web address] Password [facility] 07/23/2017, 6:57 PM

## 2017-07-23 NOTE — Progress Notes (Signed)
Physical Therapy Treatment Patient Details Name: [name] MRN: [id number] DOB: 1965-04-04 Today's Date: 07/23/2017    History of Present Illness This 52 y.o. female brought to ED after sustaining multiple falls due to worsening LE weakness.  x - ray showed Rt fibular fx.  MRI of lumbar spine showed stable edema present within the Rt sided paraspinal muscles and sucutaneous fat at L4-5 and L5-S1.   PMH:   DM, sacral fx due to fall 9/18 with subsequent  LE weakness, h/o parathesias, morbid obesity, DM, anxiety, recent back surgery.     PT Comments    Pt very fearful of falling and anxious during treatment. Agreeable to transfer to recliner chair via A/P transfer and required mod A +2 for transfer. Pt hesitant to stand, stating that her not injured LE cannot support her and gives out when she stands. Recommend attempting sit<>stand with stedy next session for pt safety and improved confidence. D/c recs updated to SNF to maximize pt's functional independence and safety with mobility.    Follow Up Recommendations  SNF;Supervision/Assistance - 24 hour     Equipment Recommendations  None recommended by PT    Recommendations for Other Services       Precautions / Restrictions Precautions Precautions: Fall Precaution Comments: Pt with multiple falls on day of admission.   She also reports frequent falls at home since fall of 2018 Required Braces or Orthoses: Knee Immobilizer - Right Knee Immobilizer - Right: On at all times Restrictions Weight Bearing Restrictions: Yes RLE Weight Bearing: Weight bearing as tolerated    Mobility  Bed Mobility Overal bed mobility: Needs Assistance Bed Mobility: Supine to Sit;Rolling Rolling: Min assist     Sit to supine: Min assist(long sitting)   General bed mobility comments: min A to roll with assist to manage R LE. Min a to power trunk up into long sitting.  Transfers Overall transfer level: Needs assistance Equipment used:  None Transfers: Comptroller transfers: Mod assist;+2 physical assistance   General transfer comment: mod A +2 for A/P transfer to recliner chair visual and verbal cues provided for technique to progress hips backward.   Ambulation/Gait                 Stairs             Wheelchair Mobility    Modified Rankin (Stroke Patients Only)       Balance Overall balance assessment: Needs assistance Sitting-balance support: Feet unsupported;Single extremity supported Sitting balance-Leahy Scale: Poor Sitting balance - Comments: requires UE support                                     Cognition Arousal/Alertness: Awake/alert Behavior During Therapy: Anxious Overall Cognitive Status: Within Functional Limits for tasks assessed                                 General Comments: Pt anxious about falling.      Exercises      General Comments        Pertinent Vitals/Pain Pain Assessment: Faces Faces Pain Scale: Hurts even more Pain Location: Rt knee and ankle (report Rt ankle is chronic) Pain Descriptors / Indicators: Aching;Grimacing;Guarding Pain Intervention(s): Monitored during session;Limited activity within patient's tolerance;Repositioned    Home Living  Prior Function            PT Goals (current goals can now be found in the care plan section) Acute Rehab PT Goals Patient Stated Goal: to walk  PT Goal Formulation: With patient Time For Goal Achievement: 08/04/17 Potential to Achieve Goals: Good Progress towards PT goals: Progressing toward goals    Frequency    Min 3X/week      PT Plan Discharge plan needs to be updated    Co-evaluation              AM-PAC PT "6 Clicks" Daily Activity  Outcome Measure  Difficulty turning over in bed (including adjusting bedclothes, sheets and blankets)?: Unable Difficulty moving from lying on back  to sitting on the side of the bed? : Unable Difficulty sitting down on and standing up from a chair with arms (e.g., wheelchair, bedside commode, etc,.)?: Unable Help needed moving to and from a bed to chair (including a wheelchair)?: Total Help needed walking in hospital room?: Total Help needed climbing 3-5 steps with a railing? : Total 6 Click Score: 6    End of Session Equipment Utilized During Treatment: Right knee immobilizer Activity Tolerance: Patient tolerated treatment well Patient left: with call bell/phone within reach;in chair Nurse Communication: Mobility status PT Visit Diagnosis: Pain;Unsteadiness on feet (R26.81);Repeated falls (R29.6);History of falling (Z91.81);Muscle weakness (generalized) (M62.81) Pain - Right/Left: Right Pain - part of body: Leg     Time: 7209-4709 PT Time Calculation (min) (ACUTE ONLY): 27 min  Charges:  $Therapeutic Activity: 23-37 mins                    G Codes:       Benjiman Core, [place] Pager [phone] Acute Rehab   Allena Katz 07/23/2017, 4:17 PM

## 2017-07-23 NOTE — Telephone Encounter (Signed)
Refill sent to pharmacy with new instructions.

## 2017-07-23 NOTE — Progress Notes (Signed)
Rehab admissions - Please see rehab consult done by Dr. Posey Pronto yesterday recommending SNF for therapies.  Call me for questions.  #[phone]

## 2017-07-23 NOTE — Consult Note (Signed)
Reason for Consult: Weakness Referring Physician: Medicine  [name] is an 52 y.o. female.  HPI: 52 year old female status post right sided L4-5 laminotomy and microdiscectomy in January of this year.  Patient had good relief of her preoperative radicular pain but had some residual right lower extremity dorsiflexion weakness which has subsequently resolved.  She is been having increasing difficulty with proximal left lower extremity weakness without clear-cut structural cause.  She has suffered frequent falls and is hospitalized after injuring her right knee.  The patient is not having radicular pain.  She has minimal numbness.  She has weakness involving both proximal lower extremities left greater than right.  She has no bowel or bladder dysfunction.  She does have a significant history of diabetes mellitus and is morbidly obese.  Past Medical History:  Diagnosis Date  . Allergy   . Anemia   . Anxiety   . Arthritis   . Asthma   . Colon polyp   . Diabetes mellitus   . Edema   . Fatigue   . Hypertension   . IBS (irritable bowel syndrome)   . Lump in female breast   . Methicillin resistant Staphylococcus aureus in conditions classified elsewhere and of unspecified site   . Migraines   . Morbid obesity ([place])   . Night sweats   . Nonspecific abnormal results of thyroid function study   . Other B-complex deficiencies   . Paresthesias   . Pure hypercholesterolemia   . Reflux   . Sacral fracture ([place])   . Sinus problem   . Symptomatic states associated with artificial menopause   . Thyroid disease   . Type II or unspecified type diabetes mellitus without mention of complication, not stated as uncontrolled   . Unspecified sleep apnea     Past Surgical History:  Procedure Laterality Date  . abcess removal  1997   MRSA  . ABDOMINAL HYSTERECTOMY  1997   complete in 1997, partial was in 1995  . carpal tunnel release r  07/03/13   Dal[facility]orf  . [place]   . CHOLECYSTECTOMY  03/1989  . COLONOSCOPY  03/21/2011   Normal.  [facility].  [name] KNEE SURGERY  2006  . LAPAROSCOPIC GASTRIC BANDING  05/2009  . TONSILLECTOMY AND ADENOIDECTOMY  1974  . TUBAL LIGATION      Family History  Problem Relation Age of Onset  . Diabetes Mother   . Heart disease Mother 69       CHF, CAD  . Other Mother        muscle disease  . Hypertension Mother   . Hyperlipidemia Mother   . Arthritis Mother        OA hip L s/p THR  . Diabetes Father   . Heart disease Father        AMI/CABG/valve replacement age 55.  . Stroke Father   . Diabetes Brother   . Heart disease Brother        stent age 6.  . Fibromyalgia Daughter   . Crohn's disease Daughter   . Hypertension Son   . Alzheimer's disease Maternal Grandmother   . Emphysema Maternal Grandfather   . Heart disease Paternal Grandmother   . Breast cancer Unknown        2 Aunts    Social History:  reports that she has never smoked. She has never used smokeless tobacco. She reports that she drinks alcohol. She reports that she does not use drugs.  Allergies:  Allergies  Allergen Reactions  . Amoxicillin Shortness Of Breath and Rash  . Bee Venom Anaphylaxis  . Levofloxacin Shortness Of Breath and Rash  . Meloxicam Hives and Rash  . Vancomycin Anaphylaxis  . Sulfa Antibiotics Rash  . Ace Inhibitors Cough  . Codeine   . Meperidine Hcl   . Oxycodone-Acetaminophen   . Penicillins Swelling and Rash    Has patient had a PCN reaction causing immediate rash, facial/tongue/throat swelling, SOB or lightheadedness with hypotension: Yes Has patient had a PCN reaction causing severe rash involving mucus membranes or skin necrosis: Yes Has patient had a PCN reaction that required hospitalization: No Has patient had a PCN reaction occurring within the last 10 years: No If all of the above answers are "NO", then may proceed with Cephalosporin use.   Flo Shanks [Gatifloxacin] Rash    Medications: I have reviewed the  patient's current medications.  Results for orders placed or performed during the hospital encounter of 07/20/17 (from the past 48 hour(s))  Glucose, capillary     Status: Abnormal   Collection Time: 07/21/17  9:49 PM  Result Value Ref Range   Glucose-Capillary 340 (H) 65 - 99 mg/dL  CBC     Status: None   Collection Time: 07/22/17  3:51 AM  Result Value Ref Range   WBC 8.0 4.0 - 10.5 K/uL   RBC 4.36 3.87 - 5.11 MIL/uL   Hemoglobin 12.2 12.0 - 15.0 g/dL   HCT 39.4 36.0 - 46.0 %   MCV 90.4 78.0 - 100.0 fL   MCH 28.0 26.0 - 34.0 pg   MCHC 31.0 30.0 - 36.0 g/dL   RDW 14.6 11.5 - 15.5 %   Platelets 269 150 - 400 K/uL    Comment: Performed at [facility], [place] [address], [place], [place] [address]  Glucose, capillary     Status: Abnormal   Collection Time: 07/22/17  7:52 AM  Result Value Ref Range   Glucose-Capillary 241 (H) 65 - 99 mg/dL  Glucose, capillary     Status: Abnormal   Collection Time: 07/22/17 11:52 AM  Result Value Ref Range   Glucose-Capillary 304 (H) 65 - 99 mg/dL  Glucose, capillary     Status: Abnormal   Collection Time: 07/22/17  5:14 PM  Result Value Ref Range   Glucose-Capillary 260 (H) 65 - 99 mg/dL  Glucose, capillary     Status: Abnormal   Collection Time: 07/22/17 10:27 PM  Result Value Ref Range   Glucose-Capillary 210 (H) 65 - 99 mg/dL  Glucose, capillary     Status: Abnormal   Collection Time: 07/23/17  8:29 AM  Result Value Ref Range   Glucose-Capillary 227 (H) 65 - 99 mg/dL  Glucose, capillary     Status: Abnormal   Collection Time: 07/23/17 12:19 PM  Result Value Ref Range   Glucose-Capillary 179 (H) 65 - 99 mg/dL  Glucose, capillary     Status: Abnormal   Collection Time: 07/23/17  5:36 PM  Result Value Ref Range   Glucose-Capillary 290 (H) 65 - 99 mg/dL    No results found.  Pertinent items noted in HPI and remainder of comprehensive ROS otherwise negative. Blood pressure 131/70, pulse 81, temperature 97.6 F (36.4 C),  temperature source Oral, resp. rate 18, height 5\' 7"  (1.702 m), weight 127.7 kg (281 lb 8.4 oz), SpO2 99 %. Patient is awake and alert.  She is oriented and appropriate.  Speech is fluent.  Judgment and insight  are intact.  Cranial nerve function normal bilaterally.  Motor examination extremities 5/5 both upper extremities.  Motor examination of her lower extremities is limited by the presence of a right knee immobilizer.  Patient with good dorsiflexion and plantarflexion strength in both lower extremities albeit somewhat weaker on the right side which is chronic.  The patient has marked weakness of her knee extensors and hip flexors bilaterally left greater than right.  Sensory examination is nonfocal.  Reflexes are absent in both patella.  Wound is well-healed.  Assessment/Plan: I reviewed the patient's MRI scan of her lumbar spine.  This demonstrates postoperative change on the right at L4-5 without evidence of significant spinal stenosis.  There is certainly nothing within the spinal canal on the left side to cause her current symptoms.  She has a chronic disc bulge centrally at T12-L1 which is noncompressive.  I can find no spinal pathology to explain the patient's current weakness.  I do have some concern that she may be suffering symptoms secondary to femoral neuropathy related to her diabetes and obesity.  I would recommend neurology evaluation.  Cooper Render Karime Scheuermann 07/23/2017, 6:56 PM

## 2017-07-24 DIAGNOSIS — G5722 Lesion of femoral nerve, left lower limb: Secondary | ICD-10-CM | POA: Diagnosis not present

## 2017-07-24 DIAGNOSIS — R262 Difficulty in walking, not elsewhere classified: Secondary | ICD-10-CM | POA: Diagnosis not present

## 2017-07-24 DIAGNOSIS — R29898 Other symptoms and signs involving the musculoskeletal system: Secondary | ICD-10-CM | POA: Diagnosis not present

## 2017-07-24 LAB — GLUCOSE, CAPILLARY
Glucose-Capillary: 188 mg/dL — ABNORMAL HIGH (ref 65–99)
Glucose-Capillary: 251 mg/dL — ABNORMAL HIGH (ref 65–99)
Glucose-Capillary: 129 mg/dL — ABNORMAL HIGH (ref 65–99)
Glucose-Capillary: 115 mg/dL — ABNORMAL HIGH (ref 65–99)

## 2017-07-24 NOTE — Consult Note (Addendum)
Referring Physician: Modena Jansky, MD    Chief Complaint: Fall and Knee Pain   HPI: [name] is an 52 y.o. female  52 year old married female, PMH of asthma, DM 2/IDDM, HTN, morbid obesity status post gastric banding, HLD, GERD, OSA, anxiety and depression, chronic back pain, L4-L5 laminotomy and microdiscectomy in 03/2017 with Dr. Trenton Gammon due to RLE weakness and foot drop.  Post op patient continued to have improvement in her right sided radicular pain and right lower extremity dorsiflexion weakness which has since resolved.    Today, she complains of new onset left lower extremity weakness. Per patient, towards the end of March, she started experiencing increasing radicular pain and weakness in her left lower extremity. She complains of an extensive history of back pain, which for a short period of time improved post op but is now persistent.   She admits to associated numbness below the left knee, impaired ability to bear weight on it, difficulty in extending her left knee when flexed. She denies loss of bowel and bladder function. Her LLE weakness has progressed to the point that she has had multiple episodes of falls due to her LLE giving out. On 07/21/17, Patient had a very bad fall in the morning, and immediately developed severe pain in her right knee, she did not feel safe being at home and came to the ER for evaluation.  X-ray of the right lower extremity showed a right proximal  fibula fracture - ortho evaluated patient and noted fracture was minimally displaced - recommended hinged brace as well as non weight bearing status.   MRI of her L-spine did not show any acute changes, but did show stable lower lumbar disc problems, namely L4-L5 disc bulging with right-sided contact with nerve and L4-L5 and L5-S1 neural foraminal stenosis. No structural finding to explain her LLE weakness was seen on MRI. Patient was going to be discharged from the ER but she continued to have impaired ability to  stand, so PT and OT were consulted as patient was unsafe to discharge home. Due to her progressive left leg weakness and radiculopathy, Neuro was consulted.   On assessment, patient is sitting at edge of bed in no acute distress, she denies head ache. blurred vision, dizziness, occular pain or light headedness. PT has right LE pain due to recent fracture.    Past Medical History:  Diagnosis Date  . Allergy   . Anemia   . Anxiety   . Arthritis   . Asthma   . Colon polyp   . Diabetes mellitus   . Edema   . Fatigue   . Hypertension   . IBS (irritable bowel syndrome)   . Lump in female breast   . Methicillin resistant Staphylococcus aureus in conditions classified elsewhere and of unspecified site   . Migraines   . Morbid obesity ([place])   . Night sweats   . Nonspecific abnormal results of thyroid function study   . Other B-complex deficiencies   . Paresthesias   . Pure hypercholesterolemia   . Reflux   . Sacral fracture ([place])   . Sinus problem   . Symptomatic states associated with artificial menopause   . Thyroid disease   . Type II or unspecified type diabetes mellitus without mention of complication, not stated as uncontrolled   . Unspecified sleep apnea     Past Surgical History:  Procedure Laterality Date  . abcess removal  1997   MRSA  . ABDOMINAL HYSTERECTOMY  1997  complete in 1997, partial was in 1995  . carpal tunnel release r  07/03/13   [facility]orf  . [place]  . CHOLECYSTECTOMY  03/1989  . COLONOSCOPY  03/21/2011   Normal.  [facility].  [name] KNEE SURGERY  2006  . LAPAROSCOPIC GASTRIC BANDING  05/2009  . TONSILLECTOMY AND ADENOIDECTOMY  1974  . TUBAL LIGATION      Family History  Problem Relation Age of Onset  . Diabetes Mother   . Heart disease Mother 62       CHF, CAD  . Other Mother        muscle disease  . Hypertension Mother   . Hyperlipidemia Mother   . Arthritis Mother        OA hip L s/p THR  . Diabetes Father   . Heart  disease Father        AMI/CABG/valve replacement age 25.  . Stroke Father   . Diabetes Brother   . Heart disease Brother        stent age 30.  . Fibromyalgia Daughter   . Crohn's disease Daughter   . Hypertension Son   . Alzheimer's disease Maternal Grandmother   . Emphysema Maternal Grandfather   . Heart disease Paternal Grandmother   . Breast cancer Unknown        2 Aunts   Social History:  reports that she has never smoked. She has never used smokeless tobacco. She reports that she drinks alcohol. She reports that she does not use drugs.  Allergies:  Allergies  Allergen Reactions  . Amoxicillin Shortness Of Breath and Rash  . Bee Venom Anaphylaxis  . Levofloxacin Shortness Of Breath and Rash  . Meloxicam Hives and Rash  . Vancomycin Anaphylaxis  . Sulfa Antibiotics Rash  . Ace Inhibitors Cough  . Codeine   . Meperidine Hcl   . Oxycodone-Acetaminophen   . Penicillins Swelling and Rash    Has patient had a PCN reaction causing immediate rash, facial/tongue/throat swelling, SOB or lightheadedness with hypotension: Yes Has patient had a PCN reaction causing severe rash involving mucus membranes or skin necrosis: Yes Has patient had a PCN reaction that required hospitalization: No Has patient had a PCN reaction occurring within the last 10 years: No If all of the above answers are "NO", then may proceed with Cephalosporin use.   Flo Shanks [Gatifloxacin] Rash    Medications:  . atorvastatin  40 mg Oral Daily  . cyclobenzaprine  10 mg Oral TID  . enoxaparin (LOVENOX) injection  60 mg Subcutaneous Q24H  . FLUoxetine  20 mg Oral Daily  . fluticasone  2 spray Each Nare Daily  . gabapentin  300 mg Oral TID  . hydrochlorothiazide  12.5 mg Oral Daily  . insulin aspart  0-15 Units Subcutaneous TID WC  . insulin aspart  0-5 Units Subcutaneous QHS  . insulin aspart  14 Units Subcutaneous TID WC  . insulin detemir  36 Units Subcutaneous BID WC  . loratadine  10 mg Oral Daily  .  losartan  50 mg Oral Daily  . naproxen  500 mg Oral BID WC    ROS: Reviewed with patient and are negative except for above in HPI   Physical Examination: Blood pressure 130/60, pulse 84, temperature (!) 97.5 F (36.4 C), temperature source Oral, resp. rate 16, height 5\' 7"  (1.702 m), weight 127.7 kg (281 lb 8.4 oz), SpO2 99 %. HEENT-  Normocephalic, no lesions, without obvious abnormality.  Normal  external eye and conjunctiva.   Cardiovascular- S1-S2 audible, pulses palpable throughout   Lungs-no rhonchi or wheezing noted, no excessive working breathing.  Saturations within normal limits Abdomen- All 4 quadrants palpated and nontender Musculoskeletal-no joint tenderness, deformity or swelling Skin-warm and dry, no hyperpigmentation, vitiligo, or suspicious lesions  Neurological Examination Mental Status: Alert, oriented, thought content appropriate.  Speech fluent without evidence of aphasia.  Able to follow 3 step commands without difficulty. Cranial Nerves: II: Visual fields grossly normal,  III,IV, VI: ptosis not present, extra-ocular motions intact bilaterally pupils equal, round, reactive to light and accommodation V,VII: smile symmetric, facial light touch sensation normal bilaterally VIII: Hearing intact to voice IX,X: uvula rises symmetrically XI: bilateral shoulder shrug XII: midline tongue extension Motor:  RLE: diffuse weakness in context of pain, 3/5 dorsiflexion in ankle and plantar, the rest of the leg 4/5.  Left: 4/5 strength in left ankle dorsiflexion and plantarflexion, 2/5 knee extension, 4/5 knee flexion, 4/5 left thigh abduction, 2/5 left thigh adduction and flexion.  Upper extremities 5/5 bilaterally. Decreased tone in some directions of passive movement of LLE, corresponding to the weakness documented above.  No atrophy noted Sensory:  Right: Right great toe moderately decreased proprioception as well as moderate decreased vibratory sense of the great toe and  ankle.  Left:  Left great toe moderate to severe proprioception loss in ankle, moderately decreased vibratory sense in the left ankle and great toes, with mild vibratory sensory loss in the left pinky toe.  Loss of temperature sensation along the medial aspect of the left calf and dorsum of the left foot. Deep Tendon Reflexes: 2+ and symmetric throughout except for absent left patellar reflex. Plantars: Right: downgoing   Left: downgoing Cerebellar: normal finger-to-nose bilaterally.   Gait: Not assessed secondary to safety  History and examination documented by Jacob Moores DNP, Neuro-hospitalist Team [phone]  Assessment: 52 y.o. female with subacute progressive LLE weakness.  1. Sensory exam findings best localize to the saphenous branch of the left femoral nerve (this is a strictly sensory nerve with no motor function; it branches off the femoral nerve at about the mid-thigh and is its largest cutaneous branch). In support of this localization, there is a patch of skin anterior to her left patella which also has sensory loss - corresponding to the infrapatellar branch of the saphenous nerve.  2. Motor exam deficits predominantly affecting left hip flexion and left knee extension with relative sparing of ankle dorsi/plantar flexion and knee flexion.  3. 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 This is seen with higher frequency in morbidly obese patients.   Plan: 1. EMG/NCS as outpatient.  2. MRI of left hip and thigh. 3. Treating those with nerve entrapment in the hip and pelvic region may be challenging as intervention strategies have not been well studied. Manual therapy, stretching and strengthening exercises, aerobic conditioning, and cognitive-behavioral education are potential interventions. 4. When conservative treatment,  including injections, is ineffective at relieving symptoms associated with nerve entrapment, surgical treatment with neurolysis or neurectomy may be considered.Neurolysis involves surgical decompression and removal of adhesions that are causing the nerve entrapment. 5. PT 6. Weight loss    I have seen and examined the patient. I have discussed the assessment and plan with the Neurology team.  Electronically signed: Dr. Kerney Elbe

## 2017-07-24 NOTE — Progress Notes (Signed)
PROGRESS NOTE   [name]  BMW:[id number]    DOB: 23-Mar-1965    DOA: 07/20/2017  PCP: Pleas Koch, NP   I have briefly reviewed patients previous medical records in [facility].  Brief Narrative:  52 year old married female, PMH of asthma, DM 2/IDDM, HTN, morbid obesity status post gastric banding, HLD, GERD, OSA, anxiety and depression, chronic back pain, underwent urgent lumbar spine surgery by Dr. Trenton Gammon due to RLE weakness and foot drop, supposed to have a shot to the back on 07/25/2017, presented to ED on 07/21/2017 due to significant right lower extremity pain after multiple falls at home, inadequate improvement of right lower extremity weakness post surgery, new onset weakness of left lower extremity for the last 3 to 4 weeks, noted to have right fibular fracture and admitted for observation and evaluation for rehab admission.  MRI of lumbar spine without acute findings.  Neurosurgery and neurology consulted.   Assessment & Plan:   Principal Problem:   Unable to walk Active Problems:   Uncontrolled type 2 diabetes mellitus with hyperglycemia, with long-term current use of insulin (HCC)   Hyperlipidemia   Essential hypertension   Anxiety   Asthma   GERD (gastroesophageal reflux disease)   Morbid obesity (HCC)   Lumbar disc disease with radiculopathy   Lumbar foraminal stenosis   Multiple falls   Displaced fracture of proximal end of right fibula   Diabetes mellitus type 2 in obese (HCC)   Migraine without status migrainosus, not intractable   Chronic pain syndrome   Lower extremity weakness (L>R), gait ataxia with recurrent falls: Etiology not clear but likely multifactorial related to deconditioning, morbid obesity, suspected peripheral neuropathy, others.  It was felt unsafe for patient to return home.  Admitted for observation.  PT evaluated and recommended CIR consult but not felt appropriate for inpatient rehab.  MRI of L-spine without acute findings, shows  degenerative changes.  Neurosurgery/Dr. Trenton Gammon input appreciated and does not see any spinal pathology to explain patient's current weakness and recommended Neurology consultation.  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  Right proximal fibula fracture, minimally displaced: [facility] input appreciated and recommend that if she is having substantial pain a hinge knee brace could be utilized for pain control, otherwise no bracing or splinting is required for this fracture.  No additional treatment or work-up is needed and she can weight-bear as tolerated.  Pain control.  Uncontrolled type II DM with hyperglycemia: A1c: 9.5.  Continue home dose of Levemir 36 units twice daily.  Resume mealtime NovoLog at lower than home dose at 12 units 3 times daily.  Changed NovoLog SSI from resistant to moderate scale.  Hold oral hypoglycemics and Trulicity while hospitalized. Monitor closely and adjust insulins as needed.  CBGs mildly uncontrolled and fluctuating.  Increased mealtime NovoLog to 14 units.  Mildly uncontrolled and fluctuating.  No change in regimen for today.  Essential hypertension: Controlled.  Continue HCTZ and losartan.  Hyperlipidemia: Continue atorvastatin.  Anxiety and depression: Stable.  Continue fluoxetine.  Morbid obesity/Body mass index is 44.09 kg/m.     DVT prophylaxis: Lovenox Code Status: Full Family Communication: None at bedside today. Disposition: DC to SNF possibly 5/8 pending MRI.   Consultants:  Rehab MD Neurosurgery Neurology  Procedures:  Right lower extremity splint.  Antimicrobials:  None   Subjective: Ongoing mild intermittent right leg pain.  Reports history of diabetes for 20+ years and apparently was well controlled until 6  months ago when her activity level dropped due to leg weakness.  ROS: As above.  Objective:  Vitals:   07/23/17 0506 07/23/17 2014 07/24/17 0532 07/24/17 1430  BP: 131/70 120/62 130/60 (!) 125/59  Pulse: 81 (!) 102 84 100  Resp: 18 16 16 16   Temp: 97.6 F (36.4 C) 98 F (36.7 C) (!) 97.5 F (36.4 C) 98 F (36.7 C)  TempSrc: Oral Oral Oral Oral  SpO2: 99% 95% 99% 100%  Weight:      Height:        Examination: No significant change in exam compared to 5/6.  General exam: Pleasant young female, moderately built and morbidly obese lying comfortably supine in bed. Respiratory system: Clear to auscultation. Respiratory effort normal.  Stable. Cardiovascular system: S1 & S2 heard, RRR. No JVD, murmurs, rubs, gallops or clicks. No pedal edema.  Stable. Gastrointestinal system: Abdomen is nondistended, soft and nontender. No organomegaly or masses felt. Normal bowel sounds heard. Central nervous system: Alert and oriented. No focal neurological deficits. Extremities: Symmetric 5 x 5 power in UE's. Grade 2/5 power atleast in RLE, evaluation limited by pain.  Grade 3 x 5 power in left lower extremity proximally and 2 x 5 power distally.  Right lower extremity in splint.  Stable. Skin: No rashes, lesions or ulcers Psychiatry: Judgement and insight appear normal. Mood & affect appropriate.     Data Reviewed: I have personally reviewed following labs and imaging studies  CBC: Recent Labs  Lab 07/20/17 1910 07/21/17 1514 07/22/17 0351  WBC 11.8* 8.7 8.0  NEUTROABS 6.5  --   --   HGB 13.9 13.4 12.2  HCT 42.1 41.7 39.4  MCV 89.0 89.1 90.4  PLT 310 264 833   Basic Metabolic Panel: Recent Labs  Lab 07/20/17 1910 07/21/17 1514  NA 141  --   K 4.1  --   CL 105  --   CO2 23  --   GLUCOSE 241*  --   BUN 17  --   CREATININE 0.66 0.74  CALCIUM 9.5  --    HbA1C: No results for input(s): HGBA1C in the last 72 hours. CBG: Recent Labs  Lab 07/23/17 1219 07/23/17 1736  07/23/17 2015 07/24/17 0830 07/24/17 1237  GLUCAP 179* 290* 190* 188* 251*    No results found for this or any previous visit (from the past 240 hour(s)).       Radiology Studies: No results found.      Scheduled Meds: . atorvastatin  40 mg Oral Daily  . cyclobenzaprine  10 mg Oral TID  . enoxaparin (LOVENOX) injection  60 mg Subcutaneous Q24H  . FLUoxetine  20 mg Oral Daily  . fluticasone  2 spray Each Nare Daily  . gabapentin  300 mg Oral TID  . hydrochlorothiazide  12.5 mg Oral Daily  . insulin aspart  0-15 Units Subcutaneous TID WC  . insulin aspart  0-5 Units Subcutaneous QHS  . insulin aspart  14 Units Subcutaneous TID WC  . insulin detemir  36 Units Subcutaneous BID WC  . loratadine  10 mg Oral Daily  . losartan  50 mg Oral Daily  . naproxen  500 mg Oral BID WC   Continuous Infusions:   LOS: 0 days     Vernell Leep, MD, FACP, [facility]. [facility] Pager [phone]  If 7PM-7AM,  please contact night-coverage [email or web address] Password [facility] 07/24/2017, 4:27 PM

## 2017-07-24 NOTE — Progress Notes (Signed)
CSW updated patient that [facility] has a bed and authorization for her once medically stable.   Percell Locus Caryssa Elzey LCSW [phone]

## 2017-07-24 NOTE — Progress Notes (Signed)
Physical Therapy Treatment Patient Details Name: [name] MRN: [id number] DOB: 31-Jan-1966 Today's Date: 07/24/2017    History of Present Illness This 52 y.o. female brought to ED after sustaining multiple falls due to worsening LE weakness.  x - ray showed Rt fibular fx.  MRI of lumbar spine showed stable edema present within the Rt sided paraspinal muscles and sucutaneous fat at L4-5 and L5-S1.   PMH:   DM, sacral fx due to fall 9/18 with subsequent  LE weakness, h/o parathesias, morbid obesity, DM, anxiety, recent back surgery.     PT Comments    Pt with limited progress towards her goals secondary to pain, weakness and fear of falling. Attempted to perform sit<>stand transfers with [facility], however pt experienced increase in R LE pain with attempt to pull her self up into standing. Pt returned to bed and with modA able to perform A-P transfer to recliner. Discharge plans remain appropriate at this time. PT will continue to follow acutely until d/c.     Follow Up Recommendations  SNF;Supervision/Assistance - 24 hour     Equipment Recommendations  None recommended by PT    Recommendations for Other Services       Precautions / Restrictions Precautions Precautions: Fall Precaution Comments: Pt with multiple falls on day of admission.   She also reports frequent falls at home since fall of 2018 Required Braces or Orthoses: Knee Immobilizer - Right Knee Immobilizer - Right: On at all times Restrictions Weight Bearing Restrictions: Yes RLE Weight Bearing: Weight bearing as tolerated    Mobility  Bed Mobility Overal bed mobility: Needs Assistance Bed Mobility: Supine to Sit Rolling: Min assist   Supine to sit: Mod assist Sit to supine: Min assist(long sitting)   General bed mobility comments: minA for management of LE off bed, mod A for management of LE back into bed  Transfers Overall transfer level: Needs assistance Equipment used: None Transfers: Anterior-Posterior  Transfer;Sit to/from Stand Sit to Stand: Max assist;+2 physical assistance;From elevated surface     Anterior-Posterior transfers: Mod assist;+2 physical assistance   General transfer comment: 3x attempt to sit>stand to [facility] from elevated surface, pt self limiting with fear of pain in R LE, mod Ax2 for scooting posteriorly into recliner,      Balance Overall balance assessment: Needs assistance Sitting-balance support: Feet unsupported;Single extremity supported Sitting balance-Leahy Scale: Fair Sitting balance - Comments: able to sit without support                                    Cognition Arousal/Alertness: Awake/alert Behavior During Therapy: Anxious Overall Cognitive Status: Within Functional Limits for tasks assessed                                 General Comments: Pt anxious about falling and pain             Pertinent Vitals/Pain Pain Assessment: Faces Faces Pain Scale: Hurts even more Pain Location: Rt knee and ankle (report Rt ankle is chronic) Pain Descriptors / Indicators: Aching;Grimacing;Guarding Pain Intervention(s): Limited activity within patient's tolerance;Monitored during session;Repositioned           PT Goals (current goals can now be found in the care plan section) Acute Rehab PT Goals Patient Stated Goal: to walk  PT Goal Formulation: With patient Time For Goal Achievement: 08/04/17 Potential to  Achieve Goals: Good Progress towards PT goals: Progressing toward goals    Frequency    Min 3X/week      PT Plan Discharge plan needs to be updated       AM-PAC PT "6 Clicks" Daily Activity  Outcome Measure  Difficulty turning over in bed (including adjusting bedclothes, sheets and blankets)?: Unable Difficulty moving from lying on back to sitting on the side of the bed? : Unable Difficulty sitting down on and standing up from a chair with arms (e.g., wheelchair, bedside commode, etc,.)?: Unable Help  needed moving to and from a bed to chair (including a wheelchair)?: Total Help needed walking in hospital room?: Total Help needed climbing 3-5 steps with a railing? : Total 6 Click Score: 6    End of Session Equipment Utilized During Treatment: Right knee immobilizer Activity Tolerance: Patient limited by pain;Other (comment) Patient left: with call bell/phone within reach;in chair Nurse Communication: Mobility status PT Visit Diagnosis: Pain;Unsteadiness on feet (R26.81);Repeated falls (R29.6);History of falling (Z91.81);Muscle weakness (generalized) (M62.81) Pain - Right/Left: Right Pain - part of body: Leg     Time: 0109-3235 PT Time Calculation (min) (ACUTE ONLY): 19 min  Charges:  $Therapeutic Activity: 8-22 mins                    G Codes:       [name] B. Migdalia Dk PT, DPT Acute Rehabilitation  [phone] Pager [phone]     [place] 07/24/2017, 2:04 PM

## 2017-07-25 ENCOUNTER — Observation Stay (HOSPITAL_COMMUNITY): Payer: BC Managed Care – PPO

## 2017-07-25 DIAGNOSIS — G894 Chronic pain syndrome: Secondary | ICD-10-CM | POA: Diagnosis not present

## 2017-07-25 DIAGNOSIS — F419 Anxiety disorder, unspecified: Secondary | ICD-10-CM | POA: Diagnosis not present

## 2017-07-25 DIAGNOSIS — E669 Obesity, unspecified: Secondary | ICD-10-CM | POA: Diagnosis not present

## 2017-07-25 DIAGNOSIS — R262 Difficulty in walking, not elsewhere classified: Secondary | ICD-10-CM | POA: Diagnosis not present

## 2017-07-25 DIAGNOSIS — E1169 Type 2 diabetes mellitus with other specified complication: Secondary | ICD-10-CM | POA: Diagnosis not present

## 2017-07-25 LAB — GLUCOSE, CAPILLARY
Glucose-Capillary: 140 mg/dL — ABNORMAL HIGH (ref 65–99)
Glucose-Capillary: 172 mg/dL — ABNORMAL HIGH (ref 65–99)
Glucose-Capillary: 154 mg/dL — ABNORMAL HIGH (ref 65–99)

## 2017-07-25 MED ORDER — GADOBENATE DIMEGLUMINE 529 MG/ML IV SOLN
20.0000 mL | Freq: Once | INTRAVENOUS | Status: AC | PRN
  Administered 2017-07-25: 20 mL via INTRAVENOUS

## 2017-07-25 MED ORDER — HYDROCODONE-ACETAMINOPHEN 5-325 MG PO TABS: 1.0000 | ORAL_TABLET | Freq: Two times a day (BID) | ORAL | 0 refills | Status: DC | PRN

## 2017-07-25 MED ORDER — POLYETHYLENE GLYCOL 3350 17 G PO PACK: 17.0000 g | PACK | Freq: Every day | ORAL | 0 refills | Status: DC | PRN

## 2017-07-25 NOTE — Progress Notes (Signed)
Patient still in MRI. Writer called [facility] and gave report to the nurse who is going to receive the patient. Will continue to monitor.

## 2017-07-25 NOTE — Progress Notes (Signed)
Pt d/c via [facility] to [facility]. Husband taking belongings home. Ranelle Oyster, RN

## 2017-07-25 NOTE — Progress Notes (Addendum)
Patient will DC to: [facility] Anticipated DC date: 07/25/17 Family notified: Patient notifying her family Transport by: [facility]-RN to call when patient comes back from MRI Corey Harold [phone] option 1 then option 3)   Per MD patient ready for DC to [facility]. RN, patient, patient's family, and facility notified of DC. Discharge Summary and signed prescription sent to facility. RN given number for report [phone]). DC packet on chart. Ambulance transport requested for patient.   CSW signing off.  Cedric Fishman, LCSW Clinical Social Worker [phone]

## 2017-07-25 NOTE — Clinical Social Work Placement (Signed)
   CLINICAL SOCIAL WORK PLACEMENT  NOTE  Date:  07/25/2017  Patient Details  Name: [name] MRN: [id number] Date of Birth: [birth year]  Clinical Social Work is seeking post-discharge placement for this patient at the [place] level of care (*CSW will initial, date and re-position this form in  chart as items are completed):  Yes   Patient/family provided with [place] Work Department's list of facilities offering this level of care within the geographic area requested by the patient (or if unable, by the patient's family).  Yes   Patient/family informed of their freedom to choose among providers that offer the needed level of care, that participate in Medicare, Medicaid or managed care program needed by the patient, have an available bed and are willing to accept the patient.  Yes   Patient/family informed of [facility]'s ownership interest in [facility] and [facility], as well as of the fact that they are under no obligation to receive care at these facilities.  PASRR submitted to EDS on 07/25/17     PASRR number received on 07/25/17     Existing PASRR number confirmed on       FL2 transmitted to all facilities in geographic area requested by pt/family on 07/25/17     FL2 transmitted to all facilities within larger geographic area on       Patient informed that his/her managed care company has contracts with or will negotiate with certain facilities, including the following:        Yes   Patient/family informed of bed offers received.  Patient chooses bed at [facility]     Physician recommends and patient chooses bed at      Patient to be transferred to [facility] on 07/25/17.  Patient to be transferred to facility by [facility]     Patient family notified on 07/25/17 of transfer.  Name of family member notified:  Spouse     PHYSICIAN       Additional Comment:    _______________________________________________ Benard Halsted, [place] 07/25/2017, 3:38 PM

## 2017-07-25 NOTE — Progress Notes (Signed)
[facility]                                                                              Patient Demographics  [name], is a 52 y.o. female, DOB - 08-04-1965, DDU:[id number]  Admit date - 07/20/2017   Admitting Physician Lady Deutscher, MD  Outpatient Primary MD for the patient is Pleas Koch, NP  Outpatient specialists:   LOS - 0  days   Medical records reviewed and are as summarized below:    Chief Complaint  Patient presents with  . Fall  . Knee Pain       Brief summary   52 year old married female, PMH of asthma, DM 2/IDDM, HTN, morbid obesity status post gastric banding, HLD, GERD, OSA, anxiety and depression, chronic back pain, underwent urgent lumbar spine surgery by Dr. Trenton Gammon due to RLE weakness and foot drop, supposed to have a shot to the back on 07/25/2017, presented to ED on 07/21/2017 due to significant right lower extremity pain after multiple falls at home, inadequate improvement of right lower extremity weakness post surgery, new onset weakness of left lower extremity for the last 3 to 4 weeks, noted to have right fibular fracture and admitted for observation and evaluation for rehab admission.  MRI of lumbar spine without acute findings.  Neurosurgery and neurology consulted.   Assessment & Plan    Lower extremity weakness left > right, gait instability with ataxia, recurrent falls -Unclear etiology, possibly multifactorial secondary to deconditioning, obesity, peripheral neuropathy -PT recommended CIR consult however not felt appropriate for inpatient rehab. -Neurosurgery/Dr. 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  Recommended  nerve conduction study/EMG outpatient.  Recent falls, right proximal fibula fracture minimally displaced -Orthopedics was consulted, recommended if having substantial pain, knee brace could be utilized otherwise no bracing or splinting. -Weightbearing as tolerated, pain control, rehab  Uncontrolled type 2 diabetes, with hyperglycemia -A1c 9.5, continue Levemir, mealtime NovoLog, sliding scale insulin -Hold oral hypoglycemics and Trulicity while inpatient. -CBGs improving, no changes today  Essential hypertension -Currently stable, continue HCTZ, losartan   Hyperlipidemia -Continue Lipitor   Anxiety, depression -Currently stable, no SI or HI, continue fluoxetine  Morbid obesity -BMI 44, counseled on diet and weight control  Code Status: full DVT Prophylaxis:  Lovenox  Family Communication: Discussed in detail with the patient, all imaging results, lab results explained to the patient    Disposition Plan: awaiting MRI   Time Spent in minutes   25 minutes  Procedures:    Consultants:   CIR Neurology Nsx   Antimicrobials:      Medications  Scheduled Meds: . atorvastatin  40 mg Oral Daily  . cyclobenzaprine  10 mg Oral TID  . enoxaparin (LOVENOX) injection  60 mg Subcutaneous Q24H  . FLUoxetine  20 mg Oral Daily  . fluticasone  2 spray Each Nare Daily  . gabapentin  300 mg  Oral TID  . hydrochlorothiazide  12.5 mg Oral Daily  . insulin aspart  0-15 Units Subcutaneous TID WC  . insulin aspart  0-5 Units Subcutaneous QHS  . insulin aspart  14 Units Subcutaneous TID WC  . insulin detemir  36 Units Subcutaneous BID WC  . loratadine  10 mg Oral Daily  . losartan  50 mg Oral Daily  . naproxen  500 mg Oral BID WC   Continuous Infusions: PRN Meds:.acetaminophen, HYDROcodone-acetaminophen, ondansetron, polyethylene glycol   Antibiotics   Anti-infectives (From admission, onward)   None        Subjective:   [name] was seen and examined today.   Still feels weakness in legs, left >right. Eager to discharge.  Patient denies dizziness, chest pain, shortness of breath, abdominal pain, N/V/D/C. No acute events overnight.    Objective:   Vitals:   07/24/17 0532 07/24/17 1430 07/24/17 2132 07/25/17 0429  BP: 130/60 (!) 125/59 126/68 (!) 142/60  Pulse: 84 100 90 80  Resp: 16 16 18 18   Temp: (!) 97.5 F (36.4 C) 98 F (36.7 C) 98.6 F (37 C) 97.8 F (36.6 C)  TempSrc: Oral Oral Oral Oral  SpO2: 99% 100% 100% 100%  Weight:      Height:        Intake/Output Summary (Last 24 hours) at 07/25/2017 1408 Last data filed at 07/25/2017 1007 Gross per 24 hour  Intake 420 ml  Output 800 ml  Net -380 ml     Wt Readings from Last 3 Encounters:  07/21/17 127.7 kg (281 lb 8.4 oz)  05/28/17 130.1 kg (286 lb 12 oz)  02/15/17 132.4 kg (291 lb 12.8 oz)     Exam  General: Alert and oriented x 3, NAD  Eyes: PERRLA, EOMI, Anicteric Sclera,  HEENT:  Atraumatic, normocephalic, normal oropharynx  Cardiovascular: S1 S2 auscultated, no rubs, murmurs or gallops. Regular rate and rhythm.  Respiratory: Clear to auscultation bilaterally, no wheezing, rales or rhonchi  Gastrointestinal: Soft, nontender, nondistended, + bowel sounds  Ext: no pedal edema bilaterally  Neuro: right leg in immobilizer, LLE 3/5  Musculoskeletal: No digital cyanosis, clubbing  Skin: No rashes  Psych: Normal affect and demeanor, alert and oriented x3    Data Reviewed:  I have personally reviewed following labs and imaging studies  Micro Results No results found for this or any previous visit (from the past 240 hour(s)).  Radiology Reports Dg Lumbar Spine Complete  Result Date: 07/20/2017 CLINICAL DATA:  Multiple falls today and over the last two weeks - pt had lumbar surgery 01.2019 - pt states inability to move right leg and little movement of left EXAM: LUMBAR SPINE - COMPLETE 4+ VIEW COMPARISON:  Lumbar MR 06/26/2017 FINDINGS: Normal alignment of the  vertebral bodies. No loss vertebral body height or disc height. No subluxation. No pars defect. Gastric banding device noted. IMPRESSION: No acute findings lumbar spine by radiography. Electronically Signed   By: Suzy Bouchard M.D.   On: 07/20/2017 19:18   Dg Ankle Complete Right  Result Date: 07/20/2017 CLINICAL DATA:  Right ankle pain after multiple falls. EXAM: RIGHT ANKLE - COMPLETE 3+ VIEW COMPARISON:  None. FINDINGS: There is no evidence of fracture, dislocation, or joint effusion. There is no evidence of arthropathy or other focal bone abnormality. Soft tissues are unremarkable. IMPRESSION: Normal right ankle. Electronically Signed   By: Marijo Conception, M.D.   On: 07/20/2017 15:20   Mr Lumbar Spine Wo Contrast  Result Date: 07/21/2017 CLINICAL  DATA:  52 y/o F; worsening left lower extremity weakness over the past few weeks progressive since prior surgery. Sensation changes in the left lower extremity. EXAM: MRI LUMBAR SPINE WITHOUT CONTRAST TECHNIQUE: Multiplanar, multisequence MR imaging of the lumbar spine was performed. No intravenous contrast was administered. COMPARISON:  06/26/2017 lumbar spine MRI. FINDINGS: Segmentation:  Standard. Alignment:  Physiologic. Vertebrae: No loss of vertebral body height. No abnormal bone marrow signal, findings of diskitis, or suspicious bone lesion. L4-5 and L5-S1 disc desiccation and loss of disc space height. Conus medullaris and cauda equina: Conus extends to the L1 level. Conus and cauda equina appear normal. Paraspinal and other soft tissues: Edema is present within the right-sided paraspinal muscles and subcutaneous fat at the L4-5 and L5-S1 levels consistent with postsurgical changes related to right-sided L4-5 hemilaminectomy. The pattern of edema is similar to the prior MRI of the lumbar spine. Disc levels: T12-L1: Stable left central small disc protrusion and annular fissure. No significant foraminal or canal stenosis. L1-2: No significant disc  displacement, foraminal stenosis, or canal stenosis. L2-3: No significant disc displacement, foraminal stenosis, or canal stenosis. L3-4: Mild disc bulge and facet hypertrophy. No significant foraminal or canal stenosis. L4-5: Stable right central and subarticular disc protrusion narrowing the right greater than left lateral recesses with disc contact on the descending right L5 nerve root. Mild bilateral foraminal stenosis. L5-S1: Stable small central disc protrusion slightly eccentric to the left with mild stenosis of lateral recesses. Root. Combined with foraminal endplate marginal osteophytes and facet hypertrophy there is mild bilateral foraminal stenosis. IMPRESSION: 1. Stable edema present within right-sided paraspinal muscles and subcutaneous fat at L4-5 and L5-S1 levels paddle with postsurgical changes related to L4-5 laminectomy. No abnormal bone marrow signal. 2. Stable L4-5 right central and subarticular disc protrusion with contact on descending right L5 nerve root in lateral recess. 3. Stable mild bilateral L4-5 and L5-S1 foraminal stenosis. No high-grade foraminal or canal stenosis. Electronically Signed   By: Kristine Garbe M.D.   On: 07/21/2017 07:03   Mr Lumbar Spine W Wo Contrast  Result Date: 06/26/2017 CLINICAL DATA:  Increasing RIGHT leg weakness, bilateral leg numbness. Status post lumbar decompression January 2018. EXAM: MRI LUMBAR SPINE WITHOUT AND WITH CONTRAST TECHNIQUE: Multiplanar and multiecho pulse sequences of the lumbar spine were obtained without and with intravenous contrast. CONTRAST:  58mL MULTIHANCE GADOBENATE DIMEGLUMINE 529 MG/ML IV SOLN COMPARISON:  MRI of the lumbar spine March 16, 2017 FINDINGS: SEGMENTATION: For the purposes of this report, the last well-formed intervertebral disc is reported as L5-S1. ALIGNMENT: Maintained lumbar lordosis. No malalignment. VERTEBRAE: Vertebral bodies are intact. Moderate L4-5 and L5-S1 disc height loss similar to prior  examination with disc desiccation mild subacute to chronic discogenic endplate changes. No abnormal or acute bone marrow signal. No abnormal osseous or disc enhancement. CONUS MEDULLARIS AND CAUDA EQUINA: Conus medullaris terminates at L1 and demonstrates normal morphology and signal characteristics. Cauda equina is normal. No abnormal cord, leptomeningeal or epidural enhancement. PARASPINAL AND OTHER SOFT TISSUES: Mild enhancement RIGHT lower lumbar paraspinal muscles and bright STIR signal consistent with denervation, no focal fluid collection. DISC LEVELS: T12-L1: Similar 4 mm LEFT central disc protrusion with enhancing annular fissure. Mild ventral thecal sac effacement without canal stenosis or neural foraminal narrowing. L1-2, L2-3: No disc bulge, canal stenosis nor neural foraminal narrowing. L3-4: Annular bulging. Moderate facet arthropathy and ligamentum flavum redundancy without canal stenosis. Minimal LEFT neural foraminal narrowing. L4-5: Interval RIGHT hemilaminectomy. Mild facet arthropathy. 6 mm RIGHT central  disc protrusion and annular fissure with enhancement posteriorly displacing the traversing RIGHT L5 nerve within the lateral recess. No canal stenosis. Mild RIGHT greater than LEFT neural foraminal narrowing. L5-S1: Stable small central disc protrusion encroaching upon the traversing S1 nerves within the lateral recesses. No canal stenosis. Mild bilateral neural foraminal narrowing. IMPRESSION: 1. Status post interval L4-5 RIGHT hemilaminectomy. 6 mm recurrent L4-5 protrusion resulting in RIGHT L5 nerve impingement. 2. Similar overall degenerative change of the lumbar spine. 3. No canal stenosis. Minimal to mild L3-4 through L5-S1 neural foraminal narrowing. Electronically Signed   By: Elon Alas M.D.   On: 06/26/2017 18:51   Dg Knee Complete 4 Views Left  Result Date: 07/20/2017 CLINICAL DATA:  Left knee pain after multiple falls. EXAM: LEFT KNEE - COMPLETE 4+ VIEW COMPARISON:  None.  FINDINGS: No evidence of fracture, dislocation, or joint effusion. No evidence of arthropathy or other focal bone abnormality. Soft tissues are unremarkable. IMPRESSION: Normal left knee. Electronically Signed   By: Marijo Conception, M.D.   On: 07/20/2017 15:22   Dg Knee Complete 4 Views Right  Result Date: 07/20/2017 CLINICAL DATA:  Right knee pain after multiple falls. EXAM: RIGHT KNEE - COMPLETE 4+ VIEW COMPARISON:  None. FINDINGS: No joint effusion is noted. Mildly displaced proximal right fibular fracture is noted. No fracture seen involving the femur or tibia. Joint spaces are intact. IMPRESSION: Mildly displaced proximal right fibular fracture. Electronically Signed   By: Marijo Conception, M.D.   On: 07/20/2017 15:19   Dg Hips Bilat W Or Wo Pelvis 2 Views  Result Date: 07/20/2017 CLINICAL DATA:  52 year old female with multiple recent falls. New onset lower extremity weakness. EXAM: DG HIP (WITH OR WITHOUT PELVIS) 2V BILAT COMPARISON:  Pelvis radiograph 12/29/2016. FINDINGS: Bone mineralization is within normal limits. The femoral heads are normally located. The pelvis appears intact. The SI joints today and sacral ala appear within normal limits. Negative visible bowel gas pattern. The proximal left femur is intact. The proximal right femur is intact. IMPRESSION: No acute fracture or dislocation identified about the bilateral hips or pelvis. Electronically Signed   By: Genevie Ann M.D.   On: 07/20/2017 19:18    Lab Data:  CBC: Recent Labs  Lab 07/20/17 1910 07/21/17 1514 07/22/17 0351  WBC 11.8* 8.7 8.0  NEUTROABS 6.5  --   --   HGB 13.9 13.4 12.2  HCT 42.1 41.7 39.4  MCV 89.0 89.1 90.4  PLT 310 264 785   Basic Metabolic Panel: Recent Labs  Lab 07/20/17 1910 07/21/17 1514  NA 141  --   K 4.1  --   CL 105  --   CO2 23  --   GLUCOSE 241*  --   BUN 17  --   CREATININE 0.66 0.74  CALCIUM 9.5  --    GFR: Estimated Creatinine Clearance: 113 mL/min (by C-G formula based on SCr of  0.74 mg/dL). Liver Function Tests: No results for input(s): AST, ALT, ALKPHOS, BILITOT, PROT, ALBUMIN in the last 168 hours. No results for input(s): LIPASE, AMYLASE in the last 168 hours. No results for input(s): AMMONIA in the last 168 hours. Coagulation Profile: No results for input(s): INR, PROTIME in the last 168 hours. Cardiac Enzymes: No results for input(s): CKTOTAL, CKMB, CKMBINDEX, TROPONINI in the last 168 hours. BNP (last 3 results) No results for input(s): PROBNP in the last 8760 hours. HbA1C: No results for input(s): HGBA1C in the last 72 hours. CBG: Recent Labs  Lab  07/24/17 1237 07/24/17 1742 07/24/17 2124 07/25/17 0750 07/25/17 1206  GLUCAP 251* 129* 115* 140* 172*   Lipid Profile: No results for input(s): CHOL, HDL, LDLCALC, TRIG, CHOLHDL, LDLDIRECT in the last 72 hours. Thyroid Function Tests: No results for input(s): TSH, T4TOTAL, FREET4, T3FREE, THYROIDAB in the last 72 hours. Anemia Panel: No results for input(s): VITAMINB12, FOLATE, FERRITIN, TIBC, IRON, RETICCTPCT in the last 72 hours. Urine analysis:    Component Value Date/Time   BILIRUBINUR negative 08/22/2016 1122   KETONESUR trace (5) (A) 12/28/2014 1819   PROTEINUR negative 08/22/2016 1122   UROBILINOGEN negative (A) 08/22/2016 1122   NITRITE negative 08/22/2016 1122   LEUKOCYTESUR Negative 08/22/2016 1122     Ripudeep Rai M.D. [facility] 07/25/2017, 2:08 PM  Pager: [phone] Between 7am to 7pm - call Pager - [phone]  After 7pm go to [email or web address] - password [email or web address]  Call night coverage person covering after 7pm

## 2017-07-25 NOTE — Progress Notes (Signed)
Patient was discharged to nursing home ([facility]) by MD order; discharged instructions review and sent to facility with care notes and prescription; IV DIC; skin intact; facility was called and report was given to the nurse who will receive the patient; patient will be transported to facility via [facility]. Writer called [facility] and asked for this transportation.

## 2017-07-25 NOTE — Progress Notes (Signed)
Occupational Therapy Treatment Patient Details Name: SHANAUTICA FORKER MRN: [id number] DOB: [birth year] Today's Date: 07/25/2017    History of present illness This 52 y.o. female brought to ED after sustaining multiple falls due to worsening LE weakness.  x - ray showed Rt fibular fx.  MRI of lumbar spine showed stable edema present within the Rt sided paraspinal muscles and sucutaneous fat at L4-5 and L5-S1.   PMH:   DM, sacral fx due to fall 9/18 with subsequent  LE weakness, h/o parathesias, morbid obesity, DM, anxiety, recent back surgery.    OT comments  Pt reports having had a laxative and on and off bed pan all morning. Deferred OOB to chair. Pt performed bed level grooming and UB exercises with level 3 theraband. Tolerated well.  Follow Up Recommendations  SNF;Supervision/Assistance - 24 hour    Equipment Recommendations  None recommended by OT    Recommendations for Other Services      Precautions / Restrictions Precautions Precautions: Fall Precaution Comments: Pt with multiple falls on day of admission.   She also reports frequent falls at home since fall of 2018 Required Braces or Orthoses: Knee Immobilizer - Right Knee Immobilizer - Right: On at all times Restrictions Weight Bearing Restrictions: Yes RLE Weight Bearing: Weight bearing as tolerated       Mobility Bed Mobility Overal bed mobility: Needs Assistance Bed Mobility: Rolling Rolling: Min assist         General bed mobility comments: for placement of bed pan  Transfers                      Balance                                           ADL either performed or assessed with clinical judgement   ADL Overall ADL's : Needs assistance/impaired     Grooming: Oral care;Bed level;Set up                     Toilet Transfer Details (indicate cue type and reason): total assist to use bed pan Toileting- Clothing Manipulation and Hygiene: Total assistance;Bed level                Vision       Perception     Praxis      Cognition Arousal/Alertness: Awake/alert Behavior During Therapy: WFL for tasks assessed/performed Overall Cognitive Status: Within Functional Limits for tasks assessed                                          Exercises Exercises: General Upper Extremity General Exercises - Upper Extremity Shoulder Flexion: Strengthening;Both;20 reps;Supine;Theraband Theraband Level (Shoulder Flexion): Level 3 (Green) Shoulder Extension: Strengthening;Both;20 reps;Supine;Theraband Theraband Level (Shoulder Extension): Level 3 (Green) Shoulder Horizontal ABduction: Strengthening;Both;20 reps;Supine;Theraband Theraband Level (Shoulder Horizontal Abduction): Level 3 (Green) Elbow Flexion: Strengthening;Both;20 reps;Supine;Theraband Theraband Level (Elbow Flexion): Level 3 (Green) Elbow Extension: Strengthening;Both;20 reps;Supine;Theraband Theraband Level (Elbow Extension): Level 3 (Green)   Shoulder Instructions       General Comments      Pertinent Vitals/ Pain       Pain Assessment: Faces Faces Pain Scale: Hurts even more Pain Location: R LE Pain Descriptors / Indicators: Aching;Grimacing;Guarding Pain Intervention(s): Monitored during session  Home Living                                          Prior Functioning/Environment              Frequency  Min 2X/week        Progress Toward Goals  OT Goals(current goals can now be found in the care plan section)  Progress towards OT goals: Progressing toward goals  Acute Rehab OT Goals Patient Stated Goal: to walk  OT Goal Formulation: With patient Time For Goal Achievement: 08/04/17 Potential to Achieve Goals: Good  Plan Discharge plan remains appropriate    Co-evaluation                 AM-PAC PT "6 Clicks" Daily Activity     Outcome Measure   Help from another person eating meals?: None Help from another person  taking care of personal grooming?: None Help from another person toileting, which includes using toliet, bedpan, or urinal?: Total Help from another person bathing (including washing, rinsing, drying)?: A Lot Help from another person to put on and taking off regular upper body clothing?: A Lot Help from another person to put on and taking off regular lower body clothing?: Total 6 Click Score: 14    End of Session Equipment Utilized During Treatment: Right knee immobilizer  OT Visit Diagnosis: Unsteadiness on feet (R26.81);Repeated falls (R29.6);Pain Pain - Right/Left: Right Pain - part of body: Knee;Ankle and joints of foot   Activity Tolerance (limited by bowel movements)   Patient Left in bed;with call bell/phone within reach;with family/visitor present   Nurse Communication          Time: 8341-9622 OT Time Calculation (min): 34 min  Charges: OT General Charges $OT Visit: 1 Visit OT Treatments $Self Care/Home Management : 8-22 mins $Therapeutic Exercise: 8-22 mins  07/25/2017 Nestor Lewandowsky, OTR/L Pager: [phone]   Werner Lean Haze Boyden 07/25/2017, 11:43 AM

## 2017-07-25 NOTE — Discharge Summary (Signed)
Physician Discharge Summary   Patient ID: [name] MRN: [id number] DOB/AGE: March 18, 1966 52 y.o.  Admit date: 07/20/2017 Discharge date: 07/25/2017  Primary Care Physician:  Pleas Koch, NP   Recommendations for Outpatient Follow-up:  1. Follow up with PCP in 1-2 weeks 2. Please obtain BMP/CBC in one week Please follow up on the following pending results: MRI of left hip and thigh  Home Health:  SNF  Equipment/Devices: weight bearing as tolerated   Discharge Condition: stable CODE STATUS: FULL  Diet recommendation: carb modified    Discharge Diagnoses:      Gait ataxia LE weakness, left worse than right, unclear etiology  . Hyperlipidemia . Essential hypertension . Anxiety . Asthma . GERD (gastroesophageal reflux disease) . Morbid obesity ([place]) . Lumbar disc disease with radiculopathy . Lumbar foraminal stenosis . Unable to walk . Displaced fracture of proximal end of right fibula   Consults:   Orthopedics  Neurosurgery, Dr Annette Stable Neurology     Allergies:   Allergies  Allergen Reactions  . Amoxicillin Shortness Of Breath and Rash  . Bee Venom Anaphylaxis  . Levofloxacin Shortness Of Breath and Rash  . Meloxicam Hives and Rash  . Vancomycin Anaphylaxis  . Sulfa Antibiotics Rash  . Ace Inhibitors Cough  . Codeine   . Meperidine Hcl   . Oxycodone-Acetaminophen   . Penicillins Swelling and Rash    Has patient had a PCN reaction causing immediate rash, facial/tongue/throat swelling, SOB or lightheadedness with hypotension: Yes Has patient had a PCN reaction causing severe rash involving mucus membranes or skin necrosis: Yes Has patient had a PCN reaction that required hospitalization: No Has patient had a PCN reaction occurring within the last 10 years: No If all of the above answers are "NO", then may proceed with Cephalosporin use.   Flo Shanks [Gatifloxacin] Rash     DISCHARGE MEDICATIONS: Allergies as of 07/25/2017      Reactions    Amoxicillin Shortness Of Breath, Rash   Bee Venom Anaphylaxis   Levofloxacin Shortness Of Breath, Rash   Meloxicam Hives, Rash   Vancomycin Anaphylaxis   Sulfa Antibiotics Rash   Ace Inhibitors Cough   Codeine    Meperidine Hcl    Oxycodone-acetaminophen    Penicillins Swelling, Rash   Has patient had a PCN reaction causing immediate rash, facial/tongue/throat swelling, SOB or lightheadedness with hypotension: Yes Has patient had a PCN reaction causing severe rash involving mucus membranes or skin necrosis: Yes Has patient had a PCN reaction that required hospitalization: No Has patient had a PCN reaction occurring within the last 10 years: No If all of the above answers are "NO", then may proceed with Cephalosporin use.   Tequin [gatifloxacin] Rash      Medication List    TAKE these medications   acetaminophen 500 MG tablet Commonly known as:  TYLENOL Take 500 mg by mouth daily as needed for moderate pain.   ALLEGRA PO Take by mouth.   atorvastatin 40 MG tablet Commonly known as:  LIPITOR TAKE 1 TABLET BY MOUTH DAILY   cyclobenzaprine 10 MG tablet Commonly known as:  FLEXERIL Take 10 mg by mouth 3 (three) times daily.   Dulaglutide 0.75 MG/0.5ML Sopn Commonly known as:  TRULICITY Inject 0.5 ml once weekly for diabetes.   FLUoxetine 20 MG capsule Commonly known as:  PROZAC Take 1 capsule (20 mg total) by mouth daily.   fluticasone 50 MCG/ACT nasal spray Commonly known as:  FLONASE Place 2 sprays into both nostrils  daily.   FREESTYLE LIBRE 14 DAY SENSOR Misc 1 each by Does not apply route every 14 (fourteen) days.   gabapentin 300 MG capsule Commonly known as:  NEURONTIN TAKE 1 CAPSULE BY MOUTH THREE TIMES A DAY What changed:  See the new instructions.   glipiZIDE-metformin 2.5-500 MG tablet Commonly known as:  METAGLIP TAKE TWO (2) TABLETS BY MOUTH 2 TIMES DAILY BEFORE A MEAL.   glucose blood test strip ONE TOUCH ULTRA MINI Use as instructed to test blood  sugar 5 times daily.   hydrochlorothiazide 25 MG tablet Commonly known as:  HYDRODIURIL TAKE 1/2 TABLET BY MOUTH EVERY DAY. What changed:    how much to take  how to take this  when to take this   HYDROcodone-acetaminophen 5-325 MG tablet Commonly known as:  NORCO/VICODIN Take 1 tablet by mouth 2 (two) times daily as needed for moderate pain.   insulin aspart 100 UNIT/ML FlexPen Commonly known as:  NOVOLOG FLEXPEN Inject 18 Units into the skin 3 (three) times daily with meals.   Insulin Detemir 100 UNIT/ML Pen Commonly known as:  LEVEMIR FLEXTOUCH Inject 38 units every morning and 38 units every evening. What changed:  See the new instructions.   Insulin Pen Needle 32G X 4 MM Misc Commonly known as:  UNIFINE PENTIPS Use as directed for insulin injection daily.   levocetirizine 5 MG tablet Commonly known as:  XYZAL TAKE ONE TABLET BY MOUTH EVERY EVENING What changed:    how much to take  how to take this  when to take this   losartan 100 MG tablet Commonly known as:  COZAAR TAKE 1/2 TABLET BY MOUTH EVERY DAY. What changed:    how much to take  how to take this  when to take this   MULTIVITAMIN PO Take by mouth daily.   naproxen 500 MG tablet Commonly known as:  NAPROSYN Take 1 tablet (500 mg total) by mouth 2 (two) times daily with a meal.   ondansetron 4 MG disintegrating tablet Commonly known as:  ZOFRAN-ODT TAKE 1 TABLET BY MOUTH EVERY 8 HOURS AS NEEDED FOR NAUSEA AS DIRECTED.   ONE TOUCH ULTRA MINI w/Device Kit Use as instructed to test blood sugar 5 times daily.   onetouch ultrasoft lancets Use as instructed to test blood sugar 3 times daily   polyethylene glycol packet Commonly known as:  MIRALAX / GLYCOLAX Take 17 g by mouth daily as needed for mild constipation.        Brief H and P: For complete details please refer to admission H and P, but in brief 52 year old married female, PMH of asthma, DM 2/IDDM, HTN, morbid obesity status  post gastric banding, HLD, GERD, OSA, anxiety and depression, chronic back pain, underwent urgent lumbar spine surgery by Dr. Trenton Gammon due to RLE weakness and foot drop, supposed to have a shot to the back on 07/25/2017, presented to ED on 07/21/2017 due to significant right lower extremity pain after multiple falls at home, inadequate improvement of right lower extremity weakness post surgery, new onset weakness of left lower extremity for the last 3 to 4 weeks, noted to have right fibular fracture and admitted for observation and evaluation for rehab admission. MRI of lumbar spine without acute findings.Neurosurgery and neurology was consulted    Hospital Course:   Lower extremity weakness left > right, gait instability with ataxia, recurrent falls -Unclear etiology, possibly multifactorial secondary to deconditioning, obesity, peripheral neuropathy -PT recommended CIR consult however not felt appropriate for inpatient  rehab. -Neurosurgery/Dr. 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  Recommended nerve conduction study/EMG outpatient. - ambulatory referral to neurology sent   Recent falls, right proximal fibula fracture minimally displaced -Orthopedics was consulted, recommended if having substantial pain, knee brace could be utilized otherwise no bracing or splinting. -Weightbearing as tolerated, pain control, rehab  Uncontrolled type 2 diabetes, with hyperglycemia -A1c 9.5, recommended better glycemic control. Restart outpatient regimen.   Essential hypertension -Currently stable, continue HCTZ, losartan   Hyperlipidemia -Continue Lipitor   Anxiety,  depression -Currently stable, no SI or HI, continue fluoxetine  Morbid obesity -BMI 44, counseled on diet and weight control     Day of Discharge S: eager to be discharged, no acute issues   BP (!) 113/56 (BP Location: Right Arm)   Pulse 97   Temp 98.6 F (37 C) (Oral)   Resp 18   Ht _0  (1.702 m)   Wt 127.7 kg (281 lb 8.4 oz)   SpO2 100%   BMI 44.09 kg/m   Physical Exam: General: Alert and awake oriented x3 not in any acute distress. HEENT: anicteric sclera, pupils reactive to light and accommodation CVS: S1-S2 clear no murmur rubs or gallops Chest: clear to auscultation bilaterally, no wheezing rales or rhonchi Abdomen: soft nontender, nondistended, normal bowel sounds Extremities: no cyanosis, clubbing or edema noted bilaterally Neuro: right leg in splint, left leg 3/5 strength   The results of significant diagnostics from this hospitalization (including imaging, microbiology, ancillary and laboratory) are listed below for reference.      Procedures/Studies:  Dg Lumbar Spine Complete  Result Date: 07/20/2017 CLINICAL DATA:  Multiple falls today and over the last two weeks - pt had lumbar surgery 01.2019 - pt states inability to move right leg and little movement of left EXAM: LUMBAR SPINE - COMPLETE 4+ VIEW COMPARISON:  Lumbar MR 06/26/2017 FINDINGS: Normal alignment of the vertebral bodies. No loss vertebral body height or disc height. No subluxation. No pars defect. Gastric banding device noted. IMPRESSION: No acute findings lumbar spine by radiography. Electronically Signed   By: Suzy Bouchard M.D.   On: 07/20/2017 19:18   Dg Ankle Complete Right  Result Date: 07/20/2017 CLINICAL DATA:  Right ankle pain after multiple falls. EXAM: RIGHT ANKLE - COMPLETE 3+ VIEW COMPARISON:  None. FINDINGS: There is no evidence of fracture, dislocation, or joint effusion. There is no evidence of arthropathy or other focal bone abnormality. Soft tissues are unremarkable.  IMPRESSION: Normal right ankle. Electronically Signed   By: Marijo Conception, M.D.   On: 07/20/2017 15:20   Mr Lumbar Spine Wo Contrast  Result Date: 07/21/2017 CLINICAL DATA:  52 y/o F; worsening left lower extremity weakness over the past few weeks progressive since prior surgery. Sensation changes in the left lower extremity. EXAM: MRI LUMBAR SPINE WITHOUT CONTRAST TECHNIQUE: Multiplanar, multisequence MR imaging of the lumbar spine was performed. No intravenous contrast was administered. COMPARISON:  06/26/2017 lumbar spine MRI. FINDINGS: Segmentation:  Standard. Alignment:  Physiologic. Vertebrae: No loss of vertebral body height. No abnormal bone marrow signal, findings of diskitis, or suspicious bone lesion. L4-5 and L5-S1 disc desiccation and loss of disc space height. Conus medullaris and cauda equina: Conus extends to the L1 level.  Conus and cauda equina appear normal. Paraspinal and other soft tissues: Edema is present within the right-sided paraspinal muscles and subcutaneous fat at the L4-5 and L5-S1 levels consistent with postsurgical changes related to right-sided L4-5 hemilaminectomy. The pattern of edema is similar to the prior MRI of the lumbar spine. Disc levels: T12-L1: Stable left central small disc protrusion and annular fissure. No significant foraminal or canal stenosis. L1-2: No significant disc displacement, foraminal stenosis, or canal stenosis. L2-3: No significant disc displacement, foraminal stenosis, or canal stenosis. L3-4: Mild disc bulge and facet hypertrophy. No significant foraminal or canal stenosis. L4-5: Stable right central and subarticular disc protrusion narrowing the right greater than left lateral recesses with disc contact on the descending right L5 nerve root. Mild bilateral foraminal stenosis. L5-S1: Stable small central disc protrusion slightly eccentric to the left with mild stenosis of lateral recesses. Root. Combined with foraminal endplate marginal osteophytes  and facet hypertrophy there is mild bilateral foraminal stenosis. IMPRESSION: 1. Stable edema present within right-sided paraspinal muscles and subcutaneous fat at L4-5 and L5-S1 levels paddle with postsurgical changes related to L4-5 laminectomy. No abnormal bone marrow signal. 2. Stable L4-5 right central and subarticular disc protrusion with contact on descending right L5 nerve root in lateral recess. 3. Stable mild bilateral L4-5 and L5-S1 foraminal stenosis. No high-grade foraminal or canal stenosis. Electronically Signed   By: Kristine Garbe M.D.   On: 07/21/2017 07:03   Mr Lumbar Spine W Wo Contrast  Result Date: 06/26/2017 CLINICAL DATA:  Increasing RIGHT leg weakness, bilateral leg numbness. Status post lumbar decompression January 2018. EXAM: MRI LUMBAR SPINE WITHOUT AND WITH CONTRAST TECHNIQUE: Multiplanar and multiecho pulse sequences of the lumbar spine were obtained without and with intravenous contrast. CONTRAST:  99m MULTIHANCE GADOBENATE DIMEGLUMINE 529 MG/ML IV SOLN COMPARISON:  MRI of the lumbar spine March 16, 2017 FINDINGS: SEGMENTATION: For the purposes of this report, the last well-formed intervertebral disc is reported as L5-S1. ALIGNMENT: Maintained lumbar lordosis. No malalignment. VERTEBRAE: Vertebral bodies are intact. Moderate L4-5 and L5-S1 disc height loss similar to prior examination with disc desiccation mild subacute to chronic discogenic endplate changes. No abnormal or acute bone marrow signal. No abnormal osseous or disc enhancement. CONUS MEDULLARIS AND CAUDA EQUINA: Conus medullaris terminates at L1 and demonstrates normal morphology and signal characteristics. Cauda equina is normal. No abnormal cord, leptomeningeal or epidural enhancement. PARASPINAL AND OTHER SOFT TISSUES: Mild enhancement RIGHT lower lumbar paraspinal muscles and bright STIR signal consistent with denervation, no focal fluid collection. DISC LEVELS: T12-L1: Similar 4 mm LEFT central disc  protrusion with enhancing annular fissure. Mild ventral thecal sac effacement without canal stenosis or neural foraminal narrowing. L1-2, L2-3: No disc bulge, canal stenosis nor neural foraminal narrowing. L3-4: Annular bulging. Moderate facet arthropathy and ligamentum flavum redundancy without canal stenosis. Minimal LEFT neural foraminal narrowing. L4-5: Interval RIGHT hemilaminectomy. Mild facet arthropathy. 6 mm RIGHT central disc protrusion and annular fissure with enhancement posteriorly displacing the traversing RIGHT L5 nerve within the lateral recess. No canal stenosis. Mild RIGHT greater than LEFT neural foraminal narrowing. L5-S1: Stable small central disc protrusion encroaching upon the traversing S1 nerves within the lateral recesses. No canal stenosis. Mild bilateral neural foraminal narrowing. IMPRESSION: 1. Status post interval L4-5 RIGHT hemilaminectomy. 6 mm recurrent L4-5 protrusion resulting in RIGHT L5 nerve impingement. 2. Similar overall degenerative change of the lumbar spine. 3. No canal stenosis. Minimal to mild L3-4 through L5-S1 neural foraminal narrowing. Electronically Signed   By: CElon Alas  M.D.   On: 06/26/2017 18:51   Dg Knee Complete 4 Views Left  Result Date: 07/20/2017 CLINICAL DATA:  Left knee pain after multiple falls. EXAM: LEFT KNEE - COMPLETE 4+ VIEW COMPARISON:  None. FINDINGS: No evidence of fracture, dislocation, or joint effusion. No evidence of arthropathy or other focal bone abnormality. Soft tissues are unremarkable. IMPRESSION: Normal left knee. Electronically Signed   By: Marijo Conception, M.D.   On: 07/20/2017 15:22   Dg Knee Complete 4 Views Right  Result Date: 07/20/2017 CLINICAL DATA:  Right knee pain after multiple falls. EXAM: RIGHT KNEE - COMPLETE 4+ VIEW COMPARISON:  None. FINDINGS: No joint effusion is noted. Mildly displaced proximal right fibular fracture is noted. No fracture seen involving the femur or tibia. Joint spaces are intact.  IMPRESSION: Mildly displaced proximal right fibular fracture. Electronically Signed   By: Marijo Conception, M.D.   On: 07/20/2017 15:19   Dg Hips Bilat W Or Wo Pelvis 2 Views  Result Date: 07/20/2017 CLINICAL DATA:  51 year old female with multiple recent falls. New onset lower extremity weakness. EXAM: DG HIP (WITH OR WITHOUT PELVIS) 2V BILAT COMPARISON:  Pelvis radiograph 12/29/2016. FINDINGS: Bone mineralization is within normal limits. The femoral heads are normally located. The pelvis appears intact. The SI joints today and sacral ala appear within normal limits. Negative visible bowel gas pattern. The proximal left femur is intact. The proximal right femur is intact. IMPRESSION: No acute fracture or dislocation identified about the bilateral hips or pelvis. Electronically Signed   By: Genevie Ann M.D.   On: 07/20/2017 19:18       LAB RESULTS: Basic Metabolic Panel: Recent Labs  Lab 07/20/17 1910 07/21/17 1514  NA 141  --   K 4.1  --   CL 105  --   CO2 23  --   GLUCOSE 241*  --   BUN 17  --   CREATININE 0.66 0.74  CALCIUM 9.5  --    Liver Function Tests: No results for input(s): AST, ALT, ALKPHOS, BILITOT, PROT, ALBUMIN in the last 168 hours. No results for input(s): LIPASE, AMYLASE in the last 168 hours. No results for input(s): AMMONIA in the last 168 hours. CBC: Recent Labs  Lab 07/20/17 1910 07/21/17 1514 07/22/17 0351  WBC 11.8* 8.7 8.0  NEUTROABS 6.5  --   --   HGB 13.9 13.4 12.2  HCT 42.1 41.7 39.4  MCV 89.0 89.1 90.4  PLT 310 264 269   Cardiac Enzymes: No results for input(s): CKTOTAL, CKMB, CKMBINDEX, TROPONINI in the last 168 hours. BNP: Invalid input(s): POCBNP CBG: Recent Labs  Lab 07/25/17 0750 07/25/17 1206  GLUCAP 140* 172*      Disposition and Follow-up: Discharge Instructions    Ambulatory referral to Neurology   Complete by:  As directed    An appointment is requested in approximately: 2 Week(s): LE weakness, Left > right, unclear etiology    Diet Carb Modified   Complete by:  As directed    Increase activity slowly   Complete by:  As directed        DISPOSITION: SNF   DISCHARGE FOLLOW-UP  Contact information for follow-up providers    Pleas Koch, NP. Schedule an appointment as soon as possible for a visit in 2 week(s).   Specialty:  Internal Medicine Contact information: [place] [address] [place]. Schedule an appointment as soon as possible for  a visit in 2 week(s).   Contact information: [address]     [address]  [place] [address] [phone]           Contact information for after-discharge care    Destination    [facility] .   Service:  Skilled Nursing Contact information: [address] [place] [employer] [phone]                   Time coordinating discharge:  11mns   Signed:   REstill CottaM.D. [facility] 07/25/2017, 3:21 PM Pager: [phone]

## 2017-07-25 NOTE — Progress Notes (Addendum)
Patient back from MRI, very anxious and crying. She said she would like to stay to find out the result before is leaving to SNF. MD made aware. Per MD patient can stay tonight if she wants to know the results. For now, we are waiting for MRI results. Will continue to monitor.

## 2017-08-13 ENCOUNTER — Encounter: Payer: Self-pay | Admitting: Family Medicine

## 2017-08-13 ENCOUNTER — Encounter: Payer: Self-pay | Admitting: Primary Care

## 2017-08-23 ENCOUNTER — Encounter (INDEPENDENT_AMBULATORY_CARE_PROVIDER_SITE_OTHER): Payer: Self-pay

## 2017-08-24 ENCOUNTER — Telehealth: Payer: Self-pay | Admitting: Primary Care

## 2017-08-24 NOTE — Telephone Encounter (Signed)
Copied from [place] [id number]. Topic: General - Other >> Aug 24, 2017  4:21 PM Carolyn Stare wrote:  Darlina Guys with [facility]  call to say they did receive referral , pt discharged on the 08/23/17 [facility] they will go out to see pt on 08/29/17   [phone]

## 2017-08-24 NOTE — Telephone Encounter (Signed)
Noted  

## 2017-08-29 ENCOUNTER — Other Ambulatory Visit: Payer: BC Managed Care – PPO

## 2017-08-30 ENCOUNTER — Telehealth: Payer: Self-pay | Admitting: Primary Care

## 2017-08-30 NOTE — Telephone Encounter (Signed)
Copied from [place] [id number]. Topic: Quick Communication - See Telephone Encounter >> Aug 30, 2017  4:48 PM Ivar Drape wrote: CRM for notification. See Telephone encounter for: 08/30/17. Judeen Hammans w/[facility] [phone] stated they wanted the provider to know that they have been trying to set up PT and OT services with the patient, but the patient stated has refused it.  She will decide when she sees Dr. Diona Browner on 6/26 she will evaluate it again.

## 2017-08-30 NOTE — Telephone Encounter (Signed)
Noted, looks like patient has appointment on June 18th with me. I don't see where she's seeing Dr. Diona Browner. Is she coming on June 18th?

## 2017-08-31 NOTE — Telephone Encounter (Signed)
Noted  

## 2017-08-31 NOTE — Telephone Encounter (Signed)
Spoken to patient. She state that [name] had it wrong. Patient is coming to see [name] on June 18. Patient will see neurologist on June 26 to be evaluate again then decide.

## 2017-09-03 ENCOUNTER — Other Ambulatory Visit: Payer: Self-pay | Admitting: Neurology

## 2017-09-03 DIAGNOSIS — I639 Cerebral infarction, unspecified: Secondary | ICD-10-CM

## 2017-09-03 DIAGNOSIS — R29898 Other symptoms and signs involving the musculoskeletal system: Secondary | ICD-10-CM

## 2017-09-04 ENCOUNTER — Ambulatory Visit: Admission: RE | Admit: 2017-09-04 | Payer: BC Managed Care – PPO | Source: Ambulatory Visit

## 2017-09-04 ENCOUNTER — Ambulatory Visit: Admission: RE | Admit: 2017-09-04 | Payer: BC Managed Care – PPO | Source: Ambulatory Visit | Admitting: *Deleted

## 2017-09-04 ENCOUNTER — Ambulatory Visit
Admission: RE | Admit: 2017-09-04 | Discharge: 2017-09-04 | Disposition: A | Discharge status: Home / Self Care | Payer: BC Managed Care – PPO | Source: Ambulatory Visit | Attending: Primary Care | Admitting: Primary Care

## 2017-09-04 ENCOUNTER — Ambulatory Visit (INDEPENDENT_AMBULATORY_CARE_PROVIDER_SITE_OTHER): Payer: BC Managed Care – PPO | Admitting: Primary Care

## 2017-09-04 ENCOUNTER — Encounter: Payer: Self-pay | Admitting: Primary Care

## 2017-09-04 ENCOUNTER — Ambulatory Visit
Admission: RE | Admit: 2017-09-04 | Discharge: 2017-09-04 | Disposition: A | Discharge status: Home / Self Care | Payer: BC Managed Care – PPO | Source: Ambulatory Visit | Attending: Internal Medicine | Admitting: Internal Medicine

## 2017-09-04 ENCOUNTER — Other Ambulatory Visit: Payer: Self-pay | Admitting: Primary Care

## 2017-09-04 VITALS — BP 122/70 | HR 114 | Temp 98.4°F

## 2017-09-04 DIAGNOSIS — R29898 Other symptoms and signs involving the musculoskeletal system: Secondary | ICD-10-CM

## 2017-09-04 DIAGNOSIS — E1165 Type 2 diabetes mellitus with hyperglycemia: Secondary | ICD-10-CM

## 2017-09-04 DIAGNOSIS — R0781 Pleurodynia: Secondary | ICD-10-CM

## 2017-09-04 DIAGNOSIS — S82831A Other fracture of upper and lower end of right fibula, initial encounter for closed fracture: Secondary | ICD-10-CM | POA: Diagnosis not present

## 2017-09-04 DIAGNOSIS — Z794 Long term (current) use of insulin: Secondary | ICD-10-CM | POA: Diagnosis not present

## 2017-09-04 DIAGNOSIS — Z9884 Bariatric surgery status: Secondary | ICD-10-CM | POA: Insufficient documentation

## 2017-09-04 DIAGNOSIS — I10 Essential (primary) hypertension: Secondary | ICD-10-CM | POA: Diagnosis not present

## 2017-09-04 DIAGNOSIS — E1140 Type 2 diabetes mellitus with diabetic neuropathy, unspecified: Secondary | ICD-10-CM | POA: Diagnosis not present

## 2017-09-04 DIAGNOSIS — M5116 Intervertebral disc disorders with radiculopathy, lumbar region: Secondary | ICD-10-CM | POA: Diagnosis not present

## 2017-09-04 DIAGNOSIS — R238 Other skin changes: Secondary | ICD-10-CM

## 2017-09-04 LAB — POCT GLYCOSYLATED HEMOGLOBIN (HGB A1C): Hemoglobin A1C: 7.9 % — AB (ref 4.0–5.6)

## 2017-09-04 MED ORDER — CYCLOBENZAPRINE HCL 10 MG PO TABS: 10.0000 mg | ORAL_TABLET | Freq: Three times a day (TID) | ORAL | 1 refills | Status: DC | PRN

## 2017-09-04 MED ORDER — GABAPENTIN 300 MG PO CAPS: ORAL_CAPSULE | ORAL | 3 refills | Status: DC

## 2017-09-04 NOTE — Assessment & Plan Note (Signed)
Following with neurosurgery and neurology. Continue current regimen.

## 2017-09-04 NOTE — Assessment & Plan Note (Signed)
Managed on Norco per orthopedics. Continue gabapentin, naproxen, and cyclobenzaprine.

## 2017-09-04 NOTE — Patient Instructions (Addendum)
Stop by the lab and xray prior to leaving today. I will notify you of your results once received.   Start checking your blood glucose levels before meals and at bedtime. It's very important to start checking your blood sugars.  Continue Levemir 38 units twice daily, Novolog 18 units three times daily with meals for glucose levels over 110, Metformin/Glipizide, and Trulicity.   Continue to work on [employer]. Increase vegetables, fruit, whole grains, lean protein.  Ensure you are consuming 64 ounces of water daily.  Please schedule a follow up appointment in 3 months for diabetes check.  It was a pleasure to see you today!   Diabetes Mellitus and Nutrition When you have diabetes (diabetes mellitus), it is very important to have healthy eating habits because your blood sugar (glucose) levels are greatly affected by what you eat and drink. Eating healthy foods in the appropriate amounts, at about the same times every day, can help you:  Control your blood glucose.  Lower your risk of heart disease.  Improve your blood pressure.  Reach or maintain a healthy weight.  Every person with diabetes is different, and each person has different needs for a meal plan. Your health care provider may recommend that you work with a diet and nutrition specialist (dietitian) to make a meal plan that is best for you. Your meal plan may vary depending on factors such as:  The calories you need.  The medicines you take.  Your weight.  Your blood glucose, blood pressure, and cholesterol levels.  Your activity level.  Other health conditions you have, such as heart or kidney disease.  How do carbohydrates affect me? Carbohydrates affect your blood glucose level more than any other type of food. Eating carbohydrates naturally increases the amount of glucose in your blood. Carbohydrate counting is a method for keeping track of how many carbohydrates you eat. Counting carbohydrates is important to keep  your blood glucose at a healthy level, especially if you use insulin or take certain oral diabetes medicines. It is important to know how many carbohydrates you can safely have in each meal. This is different for every person. Your dietitian can help you calculate how many carbohydrates you should have at each meal and for snack. Foods that contain carbohydrates include:  Bread, cereal, rice, pasta, and crackers.  Potatoes and corn.  Peas, beans, and lentils.  Milk and yogurt.  Fruit and juice.  Desserts, such as cakes, cookies, ice cream, and candy.  How does alcohol affect me? Alcohol can cause a sudden decrease in blood glucose (hypoglycemia), especially if you use insulin or take certain oral diabetes medicines. Hypoglycemia can be a life-threatening condition. Symptoms of hypoglycemia (sleepiness, dizziness, and confusion) are similar to symptoms of having too much alcohol. If your health care provider says that alcohol is safe for you, follow these guidelines:  Limit alcohol intake to no more than 1 drink per day for nonpregnant women and 2 drinks per day for men. One drink equals 12 oz of beer, 5 oz of wine, or 1 oz of hard liquor.  Do not drink on an empty stomach.  Keep yourself hydrated with water, diet soda, or unsweetened iced tea.  Keep in mind that regular soda, juice, and other mixers may contain a lot of sugar and must be counted as carbohydrates.  What are tips for following this plan? Reading food labels  Start by checking the serving size on the label. The amount of calories, carbohydrates, fats, and  other nutrients listed on the label are based on one serving of the food. Many foods contain more than one serving per package.  Check the total grams (g) of carbohydrates in one serving. You can calculate the number of servings of carbohydrates in one serving by dividing the total carbohydrates by 15. For example, if a food has 30 g of total carbohydrates, it would  be equal to 2 servings of carbohydrates.  Check the number of grams (g) of saturated and trans fats in one serving. Choose foods that have low or no amount of these fats.  Check the number of milligrams (mg) of sodium in one serving. Most people should limit total sodium intake to less than 2,300 mg per day.  Always check the nutrition information of foods labeled as "low-fat" or "nonfat". These foods may be higher in added sugar or refined carbohydrates and should be avoided.  Talk to your dietitian to identify your daily goals for nutrients listed on the label. Shopping  Avoid buying canned, premade, or processed foods. These foods tend to be high in fat, sodium, and added sugar.  Shop around the outside edge of the grocery store. This includes fresh fruits and vegetables, bulk grains, fresh meats, and fresh dairy. Cooking  Use low-heat cooking methods, such as baking, instead of high-heat cooking methods like deep frying.  Cook using healthy oils, such as olive, canola, or sunflower oil.  Avoid cooking with butter, cream, or high-fat meats. Meal planning  Eat meals and snacks regularly, preferably at the same times every day. Avoid going long periods of time without eating.  Eat foods high in fiber, such as fresh fruits, vegetables, beans, and whole grains. Talk to your dietitian about how many servings of carbohydrates you can eat at each meal.  Eat 4-6 ounces of lean protein each day, such as lean meat, chicken, fish, eggs, or tofu. 1 ounce is equal to 1 ounce of meat, chicken, or fish, 1 egg, or 1/4 cup of tofu.  Eat some foods each day that contain healthy fats, such as avocado, nuts, seeds, and fish. Lifestyle   Check your blood glucose regularly.  Exercise at least 30 minutes 5 or more days each week, or as told by your health care provider.  Take medicines as told by your health care provider.  Do not use any products that contain nicotine or tobacco, such as  cigarettes and e-cigarettes. If you need help quitting, ask your health care provider.  Work with a Social worker or diabetes educator to identify strategies to manage stress and any emotional and social challenges. What are some questions to ask my health care provider?  Do I need to meet with a diabetes educator?  Do I need to meet with a dietitian?  What number can I call if I have questions?  When are the best times to check my blood glucose? Where to find more information:  American Diabetes Association: diabetes.org/food-and-fitness/food  Academy of Nutrition and Dietetics: PokerClues.dk  [employer] of Diabetes and Digestive and Kidney Diseases (NIH): ContactWire.be Summary  A healthy meal plan will help you control your blood glucose and maintain a healthy lifestyle.  Working with a diet and nutrition specialist (dietitian) can help you make a meal plan that is best for you.  Keep in mind that carbohydrates and alcohol have immediate effects on your blood glucose levels. It is important to count carbohydrates and to use alcohol carefully. This information is not intended to replace advice given to  you by your health care provider. Make sure you discuss any questions you have with your health care provider. Document Released: 12/01/2004 Document Revised: 04/10/2016 Document Reviewed: 04/10/2016 Elsevier Interactive Patient Education  [employer].

## 2017-09-04 NOTE — Assessment & Plan Note (Signed)
Not checking glucose levels. Discussed the dangers of not checking glucose when administering insulin. Unfortunately we cannot alter her insulin or medications today as we don't have glucose readings.  It was advised she start monitoring glucose readings for overall better diabetes control and to improve neuropathy.   Repeat A1C pending. Follow up in 3 months.

## 2017-09-04 NOTE — Assessment & Plan Note (Signed)
Continued weakness, also to left lower extremity. Following with neurology and will undergo MRI of brain tomorrow to rule out CVA.   She will resume PT/OT in late June.

## 2017-09-04 NOTE — Assessment & Plan Note (Signed)
Following with orthopedics. 

## 2017-09-04 NOTE — Assessment & Plan Note (Signed)
Stable in the office today, continue current regimen. Recent BMP reviewed and is unremarkable.

## 2017-09-04 NOTE — Assessment & Plan Note (Signed)
Lipids stable from prior readings, continue atorvastatin.

## 2017-09-04 NOTE — Progress Notes (Signed)
Subjective:    Patient ID: [name], female    DOB: 06/06/1965, 52 y.o.   MRN: [id number]  HPI  [name] is a 52 year old female who presents today for follow up.  1) Type 2 Diabetes:  Current medications include: Glipizide-Metformin 2.5-500 mg BID, Levemir 38 units BID, Novolog 18 units TID, Trulicity 0.92 mg weekly.   Also managed on gabapentin 300 mg TID for neuropathy.   She has not been checking her blood sugars at home.   Last A1C: 9.5 in May 2019, 9.7 in March 2019 Last Foot Exam: Due in August Pneumonia Vaccination: Completed in 2018 ACE/ARB: Losartan Statin: Atorvastatin   2) Chronic Back/Hip Pain, Neuropathy: Also with bilateral lower extremity pain, weakness, and numbness. Admitted to SNF for nursing rehab in May 2019, discharged home on June 7th. During her stay at nursing rehab she underwent physical and occupational therapy and wasn't making progress. It was recommended she follow up with her neurologist to determine the actual cause of her neuropathy prior to participating in PT/OT.  She followed up with her neurologist yesterday who is ruling out CVA with MRI of the brain, she will go for the scan tomorrow. She does have sensation to her left lower extremity, can move her left foot, but the left lower extremity will "give out" with pressure/weight bearing activities. Her right lower extremity remains numb, her right foot remains swollen. She cannot put pressure on the right lower extremity at all. She is mostly wheelchair bound. She does have a fracture to her right fibula after a fall one month ago and is following with orthopedics. Currently managed on gabapentin 300 mg TID, Norco 5-325 mg once daily on average, naproxen, cyclobenzaprine.   She'll be seeing a neurologist tomorrow to discuss light therapy for her neuropathy. She is hopeful she may be a candidate and will send information over to our office.   She will be seeing her neurologist on June 26th,  will be resuming physical and occupational therapy after that visit.  She was using a bedpan at home initially after her return home, rolled over to her left side and felt/heard something "pop". She's since been noticing pain to her left side, worse with movement and deep inspiration.   3) Hyperlipidemia: Currently managed on atorvastatin. Lipid panel in March 2019 with LDL of 88, Trigs of 271, HDL of 56.  4) Essential Hypertension: Currently managed on HCTZ 25 mg, losartan 100 mg. She denies chest pain.  BP Readings from Last 3 Encounters:  09/04/17 122/70  07/25/17 (!) 126/54  05/28/17 120/76    5) Anxiety: Currently managed on Prozac 20 mg. Overall feels well managed.    Review of Systems  Constitutional: Negative for fever.  Respiratory: Negative for shortness of breath.   Cardiovascular: Positive for leg swelling. Negative for chest pain.  Musculoskeletal: Positive for arthralgias and myalgias.  Skin: Negative for color change.  Neurological: Positive for weakness and numbness.  Psychiatric/Behavioral:       Feels well managed on current regimen.       Past Medical History:  Diagnosis Date  . Allergy   . Anemia   . Anxiety   . Arthritis   . Asthma   . Colon polyp   . Diabetes mellitus   . Edema   . Fatigue   . Hypertension   . IBS (irritable bowel syndrome)   . Lump in female breast   . Methicillin resistant Staphylococcus aureus in conditions classified elsewhere  and of unspecified site   . Migraines   . Morbid obesity ([place])   . Night sweats   . Nonspecific abnormal results of thyroid function study   . Other B-complex deficiencies   . Paresthesias   . Pure hypercholesterolemia   . Reflux   . Sacral fracture ([place])   . Sinus problem   . Symptomatic states associated with artificial menopause   . Thyroid disease   . Type II or unspecified type diabetes mellitus without mention of complication, not stated as uncontrolled   . Unspecified sleep apnea        Social History   Socioeconomic History  . Marital status: Married    Spouse name: Not on file  . Number of children: 3  . Years of education: college  . Highest education level: Not on file  Occupational History  . Occupation: Data processing manager: [employer]  . Financial resource strain: Not on file  . Food insecurity:    Worry: Not on file    Inability: Not on file  . Transportation needs:    Medical: Not on file    Non-medical: Not on file  Tobacco Use  . Smoking status: Never Smoker  . Smokeless tobacco: Never Used  Substance and Sexual Activity  . Alcohol use: Yes    Alcohol/week: 0.0 oz    Comment: rarely - once per week at most  . Drug use: No  . Sexual activity: Never    Birth control/protection: None  Lifestyle  . Physical activity:    Days per week: Not on file    Minutes per session: Not on file  . Stress: Not on file  Relationships  . Social connections:    Talks on phone: Not on file    Gets together: Not on file    Attends religious service: Not on file    Active member of club or organization: Not on file    Attends meetings of clubs or organizations: Not on file    Relationship status: Not on file  . Intimate partner violence:    Fear of current or ex partner: Not on file    Emotionally abused: Not on file    Physically abused: Not on file    Forced sexual activity: Not on file  Other Topics Concern  . Not on file  Social History Narrative   Exercise: walking two days per week, 30 minutes.       Married x 29 years, happily married; no abuse.       Children: 3 children; 1 grandchild; two step grandchildren; 1 gg.      Lives: with husband, mother-in-law.      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      Tobacco: never      Alcohol: socially.  One glass of wine per month.      Exercise:  Walking around campus.        Seatbelt: 100%      Guns: gun safe in garage.    Past Surgical History:  Procedure Laterality  Date  . abcess removal  1997   MRSA  . ABDOMINAL HYSTERECTOMY  1997   complete in 1997, partial was in 1995  . carpal tunnel release r  07/03/13   [facility]  . [place]  . CHOLECYSTECTOMY  03/1989  . COLONOSCOPY  03/21/2011   Normal.  [facility].  [name] KNEE SURGERY  2006  .  LAPAROSCOPIC GASTRIC BANDING  05/2009  . TONSILLECTOMY AND ADENOIDECTOMY  1974  . TUBAL LIGATION      Family History  Problem Relation Age of Onset  . Diabetes Mother   . Heart disease Mother 57       CHF, CAD  . Other Mother        muscle disease  . Hypertension Mother   . Hyperlipidemia Mother   . Arthritis Mother        OA hip L s/p THR  . Diabetes Father   . Heart disease Father        AMI/CABG/valve replacement age 58.  . Stroke Father   . Diabetes Brother   . Heart disease Brother        stent age 75.  . Fibromyalgia Daughter   . Crohn's disease Daughter   . Hypertension Son   . Alzheimer's disease Maternal Grandmother   . Emphysema Maternal Grandfather   . Heart disease Paternal Grandmother   . Breast cancer Unknown        2 Aunts    Allergies  Allergen Reactions  . Amoxicillin Shortness Of Breath and Rash  . Bee Venom Anaphylaxis  . Levofloxacin Shortness Of Breath and Rash  . Meloxicam Hives and Rash  . Vancomycin Anaphylaxis  . Sulfa Antibiotics Rash  . Ace Inhibitors Cough  . Codeine   . Meperidine Hcl   . Oxycodone-Acetaminophen   . Penicillins Swelling and Rash    Has patient had a PCN reaction causing immediate rash, facial/tongue/throat swelling, SOB or lightheadedness with hypotension: Yes Has patient had a PCN reaction causing severe rash involving mucus membranes or skin necrosis: Yes Has patient had a PCN reaction that required hospitalization: No Has patient had a PCN reaction occurring within the last 10 years: No If all of the above answers are "NO", then may proceed with Cephalosporin use.   Flo Shanks [Gatifloxacin] Rash    Current Outpatient  Medications on File Prior to Visit  Medication Sig Dispense Refill  . acetaminophen (TYLENOL) 500 MG tablet Take 500 mg by mouth daily as needed for moderate pain.    [name] atorvastatin (LIPITOR) 40 MG tablet TAKE 1 TABLET BY MOUTH DAILY 90 tablet 3  . Blood Glucose Monitoring Suppl (ONE TOUCH ULTRA MINI) w/Device KIT Use as instructed to test blood sugar 5 times daily. 1 each 0  . Continuous Blood Gluc Sensor (FREESTYLE LIBRE 14 DAY SENSOR) MISC 1 each by Does not apply route every 14 (fourteen) days. 2 each 0  . Dulaglutide (TRULICITY) 8.56 DJ/4.9FW SOPN Inject 0.5 ml once weekly for diabetes. 8 mL 0  . Fexofenadine HCl (ALLEGRA PO) Take by mouth.      [name] FLUoxetine (PROZAC) 20 MG capsule Take 1 capsule (20 mg total) by mouth daily. 180 capsule 1  . fluticasone (FLONASE) 50 MCG/ACT nasal spray Place 2 sprays into both nostrils daily. 16 g 11  . glipiZIDE-metformin (METAGLIP) 2.5-500 MG tablet TAKE TWO (2) TABLETS BY MOUTH 2 TIMES DAILY BEFORE A MEAL. 360 tablet 1  . glucose blood test strip ONE TOUCH ULTRA MINI Use as instructed to test blood sugar 5 times daily. 200 each 5  . hydrochlorothiazide (HYDRODIURIL) 25 MG tablet TAKE 1/2 TABLET BY MOUTH EVERY DAY. (Patient taking differently: TAKE 1/2 TABLET  (12.5 mg) BY MOUTH EVERY DAY.) 45 tablet 1  . HYDROcodone-acetaminophen (NORCO/VICODIN) 5-325 MG tablet Take 1 tablet by mouth 2 (two) times daily as needed for moderate pain. 10 tablet 0  .  insulin aspart (NOVOLOG FLEXPEN) 100 UNIT/ML FlexPen Inject 18 Units into the skin 3 (three) times daily with meals. 15 mL 5  . Insulin Detemir (LEVEMIR FLEXTOUCH) 100 UNIT/ML Pen Inject 38 units every morning and 38 units every evening. 30 mL 2  . Insulin Pen Needle (UNIFINE PENTIPS) 32G X 4 MM MISC Use as directed for insulin injection daily. 100 each 11  . Lancets (ONETOUCH ULTRASOFT) lancets Use as instructed to test blood sugar 3 times daily 100 each 5  . levocetirizine (XYZAL) 5 MG tablet TAKE ONE TABLET BY  MOUTH EVERY EVENING (Patient taking differently: TAKE ONE TABLET (5 mg)  BY MOUTH EVERY EVENING) 90 tablet 1  . losartan (COZAAR) 100 MG tablet TAKE 1/2 TABLET BY MOUTH EVERY DAY. (Patient taking differently: TAKE 1/2 TABLET (50 mg) BY MOUTH EVERY DAY.) 90 tablet 1  . Multiple Vitamin (MULTIVITAMIN PO) Take by mouth daily.      . naproxen (NAPROSYN) 500 MG tablet Take 1 tablet (500 mg total) by mouth 2 (two) times daily with a meal. 30 tablet 0  . ondansetron (ZOFRAN-ODT) 4 MG disintegrating tablet TAKE 1 TABLET BY MOUTH EVERY 8 HOURS AS NEEDED FOR NAUSEA AS DIRECTED. 20 tablet 0  . polyethylene glycol (MIRALAX / GLYCOLAX) packet Take 17 g by mouth daily as needed for mild constipation. 14 each 0   No current facility-administered medications on file prior to visit.     BP 122/70   Pulse (!) 114   Temp 98.4 F (36.9 C) (Oral)   SpO2 99%    Objective:   Physical Exam  Constitutional: She appears well-nourished.  Neck: Neck supple.  Cardiovascular: Normal rate and regular rhythm.  Respiratory: Effort normal and breath sounds normal.    Left lateral chest wall tenderness upon palpation around T4  Musculoskeletal:  Mild-moderate swelling to right dorsal foot.   Skin: Skin is warm and dry.  Psychiatric: She has a normal mood and affect.  Tearful at times during visit           Assessment & Plan:  Rib Pain:  Since rolling onto left side in bed. Exam today with tenderness as noted. Given history of multiple fractures will check plain films to rule out another fracture. Continue current home medications.  Pleas Koch, NP

## 2017-09-04 NOTE — Assessment & Plan Note (Signed)
Feels well managed on Prozac, continue same.

## 2017-09-05 ENCOUNTER — Ambulatory Visit
Admission: RE | Admit: 2017-09-05 | Discharge: 2017-09-05 | Disposition: A | Discharge status: Home / Self Care | Payer: BC Managed Care – PPO | Source: Ambulatory Visit | Attending: Neurology | Admitting: Neurology

## 2017-09-05 DIAGNOSIS — I639 Cerebral infarction, unspecified: Secondary | ICD-10-CM

## 2017-09-05 DIAGNOSIS — R29898 Other symptoms and signs involving the musculoskeletal system: Secondary | ICD-10-CM

## 2017-09-09 MED ORDER — KETOCONAZOLE 2 % EX CREA: TOPICAL_CREAM | CUTANEOUS | 0 refills | Status: DC

## 2017-09-13 ENCOUNTER — Telehealth: Payer: Self-pay | Admitting: Primary Care

## 2017-09-13 ENCOUNTER — Encounter: Payer: Self-pay | Admitting: Primary Care

## 2017-09-13 NOTE — Telephone Encounter (Signed)
Copied from [place] [phone]. Topic: Quick Communication - See Telephone Encounter >> Sep 13, 2017  9:56 AM Rutherford Nail, NT wrote: CRM for notification. See Telephone encounter for: 09/13/17. Mariann Laster with [facility] calling and states that she is going to fax over the packet that tells what is needed for patient to get a power wheelchair. States that patient was just discharged from a facility that had put the request in, but Mariann Laster states that the patient would likely need to see her PCP and get office notes faxed to patient's insurance.

## 2017-09-13 NOTE — Telephone Encounter (Signed)
Will need face to face encounter for power wheelchair.

## 2017-09-13 NOTE — Telephone Encounter (Signed)
Send this information through MyChart message that patient sent to [name].

## 2017-09-15 ENCOUNTER — Other Ambulatory Visit: Payer: Self-pay | Admitting: Primary Care

## 2017-09-15 DIAGNOSIS — Z794 Long term (current) use of insulin: Principal | ICD-10-CM

## 2017-09-15 DIAGNOSIS — E119 Type 2 diabetes mellitus without complications: Secondary | ICD-10-CM

## 2017-09-19 ENCOUNTER — Other Ambulatory Visit: Payer: Self-pay | Admitting: Primary Care

## 2017-09-19 DIAGNOSIS — Z794 Long term (current) use of insulin: Principal | ICD-10-CM

## 2017-09-19 DIAGNOSIS — E119 Type 2 diabetes mellitus without complications: Secondary | ICD-10-CM

## 2017-09-19 MED ORDER — INSULIN ASPART 100 UNIT/ML FLEXPEN: 18.0000 [IU] | PEN_INJECTOR | Freq: Three times a day (TID) | SUBCUTANEOUS | 2 refills | Status: DC

## 2017-09-25 NOTE — Telephone Encounter (Addendum)
Noted, my chart message sent to patient to ensure she has what she needs.

## 2017-09-26 NOTE — Telephone Encounter (Signed)
[name], will you please get patient scheduled for an office visit at her earliest convenience? It's for wheelchair evaluation. Thanks!

## 2017-09-27 ENCOUNTER — Ambulatory Visit: Payer: BC Managed Care – PPO | Admitting: Neurology

## 2017-09-28 ENCOUNTER — Ambulatory Visit: Payer: Self-pay

## 2017-09-28 NOTE — Telephone Encounter (Signed)
Noted, this seems appropriate.  

## 2017-09-28 NOTE — Telephone Encounter (Signed)
Gwyndolyn Saxon, PTA from [facility] called to report patient is dizzy, sweating, and her BP 178/88, 158/82 lying. I asked to speak to the patient. She says "I think it's anxiety that's causing the sweating. I am doing therapy and wheelchair bound. I was trying to stand and felt lightheaded, like I was going to pass out. I am very nervous about falling. I was nauseated and took my nausea medicine, which the nausea is getting better. The dizziness is getting better also." I asked what is the heart rate, Gwyndolyn Saxon says it was in the mid 90's the time during this episode and before therapy started. I asked the patient about other symptoms, she says "nausea, some sinus congestion and a little left ear pain." According to protocol, see PCP within 3 days, appointment already pre-scheduled for Monday, 10/01/17 at 1200 with [name], AGNP-C, care advice given, patient verbalized understanding.   Reason for Disposition . [1] MILD dizziness (e.g., walking normally) AND [2] has NOT been evaluated by physician for this  (Exception: dizziness caused by heat exposure, sudden standing, or poor fluid intake)  Answer Assessment - Initial Assessment Questions 1. DESCRIPTION: "Describe your dizziness."     Lightheaded 2. LIGHTHEADED: "Do you feel lightheaded?" (e.g., somewhat faint, woozy, weak upon standing)     Yes 3. VERTIGO: "Do you feel like either you or the room is spinning or tilting?" (i.e. vertigo)     No 4. SEVERITY: "How bad is it?"  "Do you feel like you are going to faint?" "Can you stand and walk?"   - MILD - walking normally   - MODERATE - interferes with normal activities (e.g., work, school)    - SEVERE - unable to stand, requires support to walk, feels like passing out now.      Wheelchair bound 5. ONSET:  "When did the dizziness begin?"     This morning doing standing exercises 6. AGGRAVATING FACTORS: "Does anything make it worse?" (e.g., standing, change in head position)     Standing 7.  HEART RATE: "Can you tell me your heart rate?" "How many beats in 15 seconds?"  (Note: not all patients can do this)       Mid 90's 8. CAUSE: "What do you think is causing the dizziness?"     I don't know 9. RECURRENT SYMPTOM: "Have you had dizziness before?" If so, ask: "When was the last time?" "What happened that time?"     Occasionally when BP drops or with sinus infections 10. OTHER SYMPTOMS: "Do you have any other symptoms?" (e.g., fever, chest pain, vomiting, diarrhea, bleeding)       Nausea, pain in left ear, sinus congestion 11. PREGNANCY: "Is there any chance you are pregnant?" "When was your last menstrual period?"       No  Protocols used: DI[facility] [address]

## 2017-10-01 ENCOUNTER — Ambulatory Visit: Payer: BC Managed Care – PPO | Admitting: Primary Care

## 2017-10-01 VITALS — BP 144/92 | HR 113 | Temp 98.1°F

## 2017-10-01 DIAGNOSIS — F419 Anxiety disorder, unspecified: Secondary | ICD-10-CM

## 2017-10-01 DIAGNOSIS — R000 Tachycardia, unspecified: Secondary | ICD-10-CM | POA: Diagnosis not present

## 2017-10-01 DIAGNOSIS — R29898 Other symptoms and signs involving the musculoskeletal system: Secondary | ICD-10-CM | POA: Diagnosis not present

## 2017-10-01 DIAGNOSIS — I10 Essential (primary) hypertension: Secondary | ICD-10-CM | POA: Diagnosis not present

## 2017-10-01 MED ORDER — FLUOXETINE HCL 40 MG PO CAPS: 40.0000 mg | ORAL_CAPSULE | Freq: Every day | ORAL | 3 refills | Status: DC

## 2017-10-01 MED ORDER — METOPROLOL TARTRATE 25 MG PO TABS: ORAL_TABLET | ORAL | 0 refills | Status: DC

## 2017-10-01 NOTE — Progress Notes (Signed)
Subjective:    Patient ID: [name], female    DOB: 1965-04-30, 52 y.o.   MRN: [id number]  HPI  [name] is a 52 year old female with a history of type 2 diabetes, chronic bilateral lower extremity weakness, lumbar foraminal stenosis, lumbar disc disease with radiculopathy, morbid obesity who presents today to for a Mobility Evaluation Visit.   1) Chronic Lower Extremity Weakness/Power Wheelchair:  1. Diagnoses affecting mobility: Lumbar disc disease with radiculopathy, polyneuropathy, lumbar foraminal stenosis, chronic lower extremity weakness.  2. Her condition has been present since September 2018 and has significantly declined since and has little to no movement/strength of her lower extremities. She has undergone surgical intervention to her lumbar spine in December 2018.  3. Current mobility equipment includes a borrowed manual wheelchair, bariatric walker. Her equipment is safe. When using a walker she requires at least two people to help her up to the walker and walk along side. With her manual wheelchair she requires assistance propelling. She cannot carry out all  ADL's with current equipment as she has little to no movement/strength to her bilateral lower extremities. She is finding it very hard to push herself in the manual wheelchair. She does use a sliding board to transfer from chair/bed to wheelchair.  4. Major ADL's affected include: Walking, toileting (using a bedpan now), bathing, cooking, cleaning, dressing.  5. A cane, walker, or manual wheelchair will not meet mobility needs in the home due to her inability to stand/transfer/maneuver by herself. She has to have two people assist with a walker, has to have her family push her wheelchair.   6. A POV scooter will not be adequate in the home due to inability to maneuver off and on as she has little movement/strength to her lower extremities. She's able to slide in and out of a wheelchair using a sliding board. Also the  scooter is too large for their home.  7. Patient is mentally and physically able to safely operate a power wheelchair in her home.   8. Patient has little to no strength to bilateral lower extremities, little sensation to her right foot (severe polyneuropathy to bilateral lower extremities), and inability to balance without assistive device. She is working with physical therapy. She has good ROM with assist.   9. Sits well, cannot stand without a supportive devise or with two people assisting.   10. She is unable to ambulate independently.   2) Anxiety: Since her last hospitalization she's noticed increased anxiety given recurrent falls, limited mobility, being wheelchair bound, little ability to complete ADL's. Over the last 2-3 weeks her symptoms have been worse and is having a hard time coping to her new way of life. She is interested in increasing her fluoxetine dose. She was once on 40 mg and did well at that dose  3) Essential Hypertension/Tachycardia: Endorses compliance to all medications but has noticed elevated readings over the last one month. Her physical therapist has noticed elevated readings with BP at 178/88 an 158/82. She has always noticed an elevated heart rate. She has not recently checked her BP at home. She doesn't monitor her HR. She denies chest pain.   BP Readings from Last 3 Encounters:  10/01/17 (!) 144/92  09/04/17 122/70  07/25/17 (!) 126/54   Wt Readings from Last 3 Encounters:  07/21/17 281 lb 8.4 oz (127.7 kg)  05/28/17 286 lb 12 oz (130.1 kg)  02/15/17 291 lb 12.8 oz (132.4 kg)     Review of  Systems  Eyes: Negative for visual disturbance.  Respiratory: Negative for shortness of breath.   Cardiovascular: Negative for chest pain.  Neurological: Negative for dizziness and weakness.  Psychiatric/Behavioral: The patient is nervous/anxious.        Past Medical History:  Diagnosis Date  . Allergy   . Anemia   . Anxiety   . Arthritis   . Asthma   .  Colon polyp   . Diabetes mellitus   . Edema   . Fatigue   . Hypertension   . IBS (irritable bowel syndrome)   . Lump in female breast   . Methicillin resistant Staphylococcus aureus in conditions classified elsewhere and of unspecified site   . Migraines   . Morbid obesity ([place])   . Night sweats   . Nonspecific abnormal results of thyroid function study   . Other B-complex deficiencies   . Paresthesias   . Pure hypercholesterolemia   . Reflux   . Sacral fracture ([place])   . Sinus problem   . Symptomatic states associated with artificial menopause   . Thyroid disease   . Type II or unspecified type diabetes mellitus without mention of complication, not stated as uncontrolled   . Unspecified sleep apnea      Social History   Socioeconomic History  . Marital status: Married    Spouse name: Not on file  . Number of children: 3  . Years of education: college  . Highest education level: Not on file  Occupational History  . Occupation: Data processing manager: [employer]  . Financial resource strain: Not on file  . Food insecurity:    Worry: Not on file    Inability: Not on file  . Transportation needs:    Medical: Not on file    Non-medical: Not on file  Tobacco Use  . Smoking status: Never Smoker  . Smokeless tobacco: Never Used  Substance and Sexual Activity  . Alcohol use: Yes    Alcohol/week: 0.0 oz    Comment: rarely - once per week at most  . Drug use: No  . Sexual activity: Never    Birth control/protection: None  Lifestyle  . Physical activity:    Days per week: Not on file    Minutes per session: Not on file  . Stress: Not on file  Relationships  . Social connections:    Talks on phone: Not on file    Gets together: Not on file    Attends religious service: Not on file    Active member of club or organization: Not on file    Attends meetings of clubs or organizations: Not on file    Relationship status: Not on file  . Intimate  partner violence:    Fear of current or ex partner: Not on file    Emotionally abused: Not on file    Physically abused: Not on file    Forced sexual activity: Not on file  Other Topics Concern  . Not on file  Social History Narrative   Exercise: walking two days per week, 30 minutes.       Married x 29 years, happily married; no abuse.       Children: 3 children; 1 grandchild; two step grandchildren; 1 gg.      Lives: with husband, mother-in-law.      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      Tobacco: never  Alcohol: socially.  One glass of wine per month.      Exercise:  Walking around campus.        Seatbelt: 100%      Guns: gun safe in garage.    Past Surgical History:  Procedure Laterality Date  . abcess removal  1997   MRSA  . ABDOMINAL HYSTERECTOMY  1997   complete in 1997, partial was in 1995  . carpal tunnel release r  07/03/13   [facility]  . [place]  . CHOLECYSTECTOMY  03/1989  . COLONOSCOPY  03/21/2011   Normal.  [facility].  [name] KNEE SURGERY  2006  . LAPAROSCOPIC GASTRIC BANDING  05/2009  . TONSILLECTOMY AND ADENOIDECTOMY  1974  . TUBAL LIGATION      Family History  Problem Relation Age of Onset  . Diabetes Mother   . Heart disease Mother 66       CHF, CAD  . Other Mother        muscle disease  . Hypertension Mother   . Hyperlipidemia Mother   . Arthritis Mother        OA hip L s/p THR  . Diabetes Father   . Heart disease Father        AMI/CABG/valve replacement age 32.  . Stroke Father   . Diabetes Brother   . Heart disease Brother        stent age 66.  . Fibromyalgia Daughter   . Crohn's disease Daughter   . Hypertension Son   . Alzheimer's disease Maternal Grandmother   . Emphysema Maternal Grandfather   . Heart disease Paternal Grandmother   . Breast cancer Unknown        2 Aunts    Allergies  Allergen Reactions  . Amoxicillin Shortness Of Breath and Rash  . Bee Venom Anaphylaxis  .  Levofloxacin Shortness Of Breath and Rash  . Meloxicam Hives and Rash  . Vancomycin Anaphylaxis  . Sulfa Antibiotics Rash  . Ace Inhibitors Cough  . Codeine   . Meperidine Hcl   . Oxycodone-Acetaminophen   . Penicillins Swelling and Rash    Has patient had a PCN reaction causing immediate rash, facial/tongue/throat swelling, SOB or lightheadedness with hypotension: Yes Has patient had a PCN reaction causing severe rash involving mucus membranes or skin necrosis: Yes Has patient had a PCN reaction that required hospitalization: No Has patient had a PCN reaction occurring within the last 10 years: No If all of the above answers are "NO", then may proceed with Cephalosporin use.   Flo Shanks [Gatifloxacin] Rash    Current Outpatient Medications on File Prior to Visit  Medication Sig Dispense Refill  . acetaminophen (TYLENOL) 500 MG tablet Take 500 mg by mouth daily as needed for moderate pain.    [name] atorvastatin (LIPITOR) 40 MG tablet TAKE 1 TABLET BY MOUTH DAILY 90 tablet 3  . Blood Glucose Monitoring Suppl (ONE TOUCH ULTRA MINI) w/Device KIT Use as instructed to test blood sugar 5 times daily. 1 each 0  . Continuous Blood Gluc Sensor (FREESTYLE LIBRE 14 DAY SENSOR) MISC 1 each by Does not apply route every 14 (fourteen) days. 2 each 0  . cyclobenzaprine (FLEXERIL) 10 MG tablet Take 1 tablet (10 mg total) by mouth 3 (three) times daily as needed for muscle spasms. 270 tablet 1  . Dulaglutide (TRULICITY) 9.52 WU/1.3KG SOPN Inject 0.5 ml once weekly for diabetes. 8 mL 0  . Fexofenadine HCl (ALLEGRA PO) Take by  mouth.      . fluticasone (FLONASE) 50 MCG/ACT nasal spray Place 2 sprays into both nostrils daily. 16 g 11  . gabapentin (NEURONTIN) 300 MG capsule TAKE 1 CAPSULE BY MOUTH THREE TIMES A DAY 270 capsule 3  . glipiZIDE-metformin (METAGLIP) 2.5-500 MG tablet TAKE TWO (2) TABLETS BY MOUTH 2 TIMES DAILY BEFORE A MEAL. 360 tablet 1  . glucose blood test strip ONE TOUCH ULTRA MINI Use as  instructed to test blood sugar 5 times daily. 200 each 5  . hydrochlorothiazide (HYDRODIURIL) 25 MG tablet TAKE 1/2 TABLET BY MOUTH EVERY DAY. (Patient taking differently: TAKE 1/2 TABLET  (12.5 mg) BY MOUTH EVERY DAY.) 45 tablet 1  . HYDROcodone-acetaminophen (NORCO/VICODIN) 5-325 MG tablet Take 1 tablet by mouth 2 (two) times daily as needed for moderate pain. 10 tablet 0  . insulin aspart (NOVOLOG FLEXPEN) 100 UNIT/ML FlexPen Inject 18 Units into the skin 3 (three) times daily with meals. 15 mL 2  . Insulin Detemir (LEVEMIR FLEXTOUCH) 100 UNIT/ML Pen Inject 38 units every morning and 38 units every evening. 30 mL 2  . Insulin Detemir (LEVEMIR FLEXTOUCH) 100 UNIT/ML Pen Inject 38 units every morning and 38 units in the evening 30 mL 2  . Insulin Pen Needle (UNIFINE PENTIPS) 32G X 4 MM MISC Use as directed for insulin injection daily. 100 each 11  . ketoconazole (NIZORAL) 2 % cream Apply topically once daily as needed for irritation. 60 g 0  . Lancets (ONETOUCH ULTRASOFT) lancets Use as instructed to test blood sugar 3 times daily 100 each 5  . levocetirizine (XYZAL) 5 MG tablet TAKE ONE TABLET BY MOUTH EVERY EVENING (Patient taking differently: TAKE ONE TABLET (5 mg)  BY MOUTH EVERY EVENING) 90 tablet 1  . losartan (COZAAR) 100 MG tablet TAKE 1/2 TABLET BY MOUTH EVERY DAY. (Patient taking differently: TAKE 1/2 TABLET (50 mg) BY MOUTH EVERY DAY.) 90 tablet 1  . Multiple Vitamin (MULTIVITAMIN PO) Take by mouth daily.      . naproxen (NAPROSYN) 500 MG tablet Take 1 tablet (500 mg total) by mouth 2 (two) times daily with a meal. 30 tablet 0  . polyethylene glycol (MIRALAX / GLYCOLAX) packet Take 17 g by mouth daily as needed for mild constipation. 14 each 0  . ondansetron (ZOFRAN-ODT) 4 MG disintegrating tablet TAKE 1 TABLET BY MOUTH EVERY 8 HOURS AS NEEDED FOR NAUSEA AS DIRECTED. (Patient not taking: Reported on 10/01/2017) 20 tablet 0   No current facility-administered medications on file prior to  visit.     BP (!) 144/92   Pulse (!) 113   Temp 98.1 F (36.7 C) (Oral)   SpO2 97%    Objective:   Physical Exam  Constitutional: She appears well-nourished.  Neck: Neck supple.  Cardiovascular: Normal rate and regular rhythm.  Respiratory: Effort normal and breath sounds normal.  Neurological:  Unable to move right lower extremity and left lower extremity without assistance. Unable to stand indepedently in clinic today.   Skin: Skin is warm and dry.           Assessment & Plan:

## 2017-10-01 NOTE — Assessment & Plan Note (Signed)
Bilateral. Wheelchair bound. Husband and family providing assistance for ADL's. Agree that she would benefit from a power wheelchair given morbid obesity, little lower extremity strength, and inability to propel herself in a manual wheelchair.

## 2017-10-01 NOTE — Patient Instructions (Signed)
We've increased your fluoxetine to 40 mg. You may take two of the 20 mg capsules until your bottle is empty.  Start metoprolol tartrate 25 mg tablets. Take 1/2 tablet by mouth twice daily for heart rate and BP control.  Monitor your blood pressure and heart reate over the next 2 weeks, we will message you for readings.  Please update me in 4 weeks regarding your anxiety.  It was a pleasure to see you today!

## 2017-10-01 NOTE — Assessment & Plan Note (Signed)
Above goal in the office today, also during home health visits. Given tachycardia, will initiate metoprolol tartrate 12.5 mg BID. She will check her BP and HR at home and report readings in 2 weeks.

## 2017-10-25 DIAGNOSIS — R29898 Other symptoms and signs involving the musculoskeletal system: Secondary | ICD-10-CM

## 2017-10-26 ENCOUNTER — Other Ambulatory Visit: Payer: Self-pay | Admitting: Primary Care

## 2017-10-26 DIAGNOSIS — E119 Type 2 diabetes mellitus without complications: Secondary | ICD-10-CM

## 2017-10-26 DIAGNOSIS — Z794 Long term (current) use of insulin: Principal | ICD-10-CM

## 2017-11-17 ENCOUNTER — Other Ambulatory Visit: Payer: Self-pay | Admitting: Primary Care

## 2017-11-17 DIAGNOSIS — E119 Type 2 diabetes mellitus without complications: Secondary | ICD-10-CM

## 2017-11-17 DIAGNOSIS — Z794 Long term (current) use of insulin: Principal | ICD-10-CM

## 2017-12-24 ENCOUNTER — Other Ambulatory Visit: Payer: Self-pay | Admitting: Primary Care

## 2017-12-24 DIAGNOSIS — R000 Tachycardia, unspecified: Secondary | ICD-10-CM

## 2017-12-24 DIAGNOSIS — I10 Essential (primary) hypertension: Secondary | ICD-10-CM

## 2017-12-27 DIAGNOSIS — R000 Tachycardia, unspecified: Secondary | ICD-10-CM

## 2017-12-27 DIAGNOSIS — I10 Essential (primary) hypertension: Secondary | ICD-10-CM

## 2017-12-27 MED ORDER — METOPROLOL TARTRATE 25 MG PO TABS: ORAL_TABLET | ORAL | 0 refills | Status: DC

## 2017-12-31 ENCOUNTER — Encounter (HOSPITAL_COMMUNITY): Payer: Self-pay

## 2018-01-10 ENCOUNTER — Ambulatory Visit (INDEPENDENT_AMBULATORY_CARE_PROVIDER_SITE_OTHER): Payer: BC Managed Care – PPO

## 2018-01-10 DIAGNOSIS — Z23 Encounter for immunization: Secondary | ICD-10-CM

## 2018-01-18 ENCOUNTER — Other Ambulatory Visit: Payer: Self-pay | Admitting: Primary Care

## 2018-01-18 DIAGNOSIS — Z794 Long term (current) use of insulin: Principal | ICD-10-CM

## 2018-01-18 DIAGNOSIS — E119 Type 2 diabetes mellitus without complications: Secondary | ICD-10-CM

## 2018-01-22 DIAGNOSIS — R110 Nausea: Secondary | ICD-10-CM

## 2018-01-22 MED ORDER — ONDANSETRON 4 MG PO TBDP: ORAL_TABLET | ORAL | 0 refills | Status: DC

## 2018-01-29 ENCOUNTER — Other Ambulatory Visit: Payer: Self-pay | Admitting: Orthopaedic Surgery

## 2018-01-30 DIAGNOSIS — J4520 Mild intermittent asthma, uncomplicated: Secondary | ICD-10-CM

## 2018-01-30 MED ORDER — ALBUTEROL SULFATE HFA 108 (90 BASE) MCG/ACT IN AERS: 1.0000 | INHALATION_SPRAY | Freq: Four times a day (QID) | RESPIRATORY_TRACT | 0 refills | Status: DC | PRN

## 2018-01-30 NOTE — Pre-Procedure Instructions (Signed)
[name]  01/30/2018      [id number] - [place], [place] [address] [place] [address] Phone: [phone] Fax: [phone]    Your procedure is scheduled on Tues., Nov. 19, 2019 from 12:30PM-1:15PM  Report to [facility] Admitting Entrance "A" at 10:30AM  Call this number if you have problems the morning of surgery:  [phone]   Remember:  Do not eat or drink after midnight on Nov. 18th    Take these medicines the morning of surgery with A SIP OF WATER: Fexofenadine HCl (ALLEGRA), FLUoxetine (PROZAC), Fluticasone (FLONASE),  Gabapentin (NEURONTIN), and Metoprolol tartrate (LOPRESSOR)   If needed: Cyclobenzaprine (FLEXERIL), Ondansetron (ZOFRAN-ODT), and Albuterol Inhaler- bring with you the day surgery  As of today, stop taking all Other Aspirin Products, Vitamins, Fish oils, and Herbal medications. Also stop all NSAIDS i.e. Advil, Ibuprofen, Motrin, Aleve, Anaprox, Naproxen, BC, Goody Powders, and all Supplements.    How to Manage Your Diabetes Before and After Surgery  Why is it important to control my blood sugar before and after surgery? . Improving blood sugar levels before and after surgery helps healing and can limit problems. . A way of improving blood sugar control is eating a healthy diet by: o  Eating less sugar and carbohydrates o  Increasing activity/exercise o  Talking with your doctor about reaching your blood sugar goals . High blood sugars (greater than 180 mg/dL) can raise your risk of infections and slow your recovery, so you will need to focus on controlling your diabetes during the weeks before surgery. . Make sure that the doctor who takes care of your diabetes knows about your planned surgery including the date and location.  How do I manage my blood sugar before surgery? . Check your blood sugar at least 4 times a day, starting 2 days before surgery, to make sure that the level is not too high  or low. o Check your blood sugar the morning of your surgery when you wake up and every 2 hours until you get to the Short Stay unit. . If your blood sugar is less than 70 mg/dL, you will need to treat for low blood sugar: o Do not take insulin. o Treat a low blood sugar (less than 70 mg/dL) with  cup of clear juice (cranberry or apple), 4 glucose tablets, OR glucose gel. Recheck blood sugar in 15 minutes after treatment (to make sure it is greater than 70 mg/dL). If your blood sugar is not greater than 70 mg/dL on recheck, call [phone] o  for further instructions. . If your CBG is greater than 220 mg/dL, you may take  (9 units of Novolog) of your sliding scale (correction) dose of insulin.  . If you are admitted to the hospital after surgery: o Your blood sugar will be checked by the staff and you will probably be given insulin after surgery (instead of oral diabetes medicines) to make sure you have good blood sugar levels. o The goal for blood sugar control after surgery is 80-180 mg/dL.  WHAT DO I DO ABOUT MY DIABETES MEDICATION?  [name] Do not take the evening doses of GlipiZIDE-metformin (METAGLIP) and Novolog Insulin the night before surgery.    . Do not take GlipiZIDE-metformin (METAGLIP) and Novolog Insulin the morning of surgery.  . THE NIGHT BEFORE SURGERY, take _____19______ units of _____Levemir______insulin.       . THE MORNING OF SURGERY, take ____19_________ units of ______Levemir____insulin.  [name]  The day of surgery, do not take other diabetes injectable Trulicity (dulaglutide).  Reviewed and Endorsed by [facility] Patient Education Committee, August 2015   Do not wear jewelry, make-up or nail polish.  Do not wear lotions, powders, or perfumes, or deodorant.  Do not shave 48 hours prior to surgery.    Do not bring valuables to the hospital.  [facility] is not responsible for any belongings or valuables.  Contacts, dentures or bridgework may not be worn into surgery.   Leave your suitcase in the car.  After surgery it may be brought to your room.  For patients admitted to the hospital, discharge time will be determined by your treatment team.  Patients discharged the day of surgery will not be allowed to drive home.   Special instructions:  [facility]- Preparing For Surgery  Before surgery, you can play an important role. Because skin is not sterile, your skin needs to be as free of germs as possible. You can reduce the number of germs on your skin by washing with CHG (chlorahexidine gluconate) Soap before surgery.  CHG is an antiseptic cleaner which kills germs and bonds with the skin to continue killing germs even after washing.    Oral Hygiene is also important to reduce your risk of infection.  Remember - BRUSH YOUR TEETH THE MORNING OF SURGERY WITH YOUR REGULAR TOOTHPASTE  Please do not use if you have an allergy to CHG or antibacterial soaps. If your skin becomes reddened/irritated stop using the CHG.  Do not shave (including legs and underarms) for at least 48 hours prior to first CHG shower. It is OK to shave your face.  Please follow these instructions carefully.   1. Shower the NIGHT BEFORE SURGERY and the MORNING OF SURGERY with CHG.   2. If you chose to wash your hair, wash your hair first as usual with your normal shampoo.  3. After you shampoo, rinse your hair and body thoroughly to remove the shampoo.  4. Use CHG as you would any other liquid soap. You can apply CHG directly to the skin and wash gently with a scrungie or a clean washcloth.   5. Apply the CHG Soap to your body ONLY FROM THE NECK DOWN.  Do not use on open wounds or open sores. Avoid contact with your eyes, ears, mouth and genitals (private parts). Wash Face and genitals (private parts)  with your normal soap.  6. Wash thoroughly, paying special attention to the area where your surgery will be performed.  7. Thoroughly rinse your body with warm water from the neck  down.  8. DO NOT shower/wash with your normal soap after using and rinsing off the CHG Soap.  9. Pat yourself dry with a CLEAN TOWEL.  10. Wear CLEAN PAJAMAS to bed the night before surgery, wear comfortable clothes the morning of surgery  11. Place CLEAN SHEETS on your bed the night of your first shower and DO NOT SLEEP WITH PETS.  Day of Surgery:  Do not apply any deodorants/lotions.  Please wear clean clothes to the hospital/surgery center.   Remember to brush your teeth WITH YOUR REGULAR TOOTHPASTE.  Please read over the following fact sheets that you were given. Pain Booklet, Coughing and Deep Breathing and Surgical Site Infection Prevention

## 2018-01-31 ENCOUNTER — Other Ambulatory Visit: Payer: Self-pay

## 2018-01-31 ENCOUNTER — Encounter (HOSPITAL_COMMUNITY)
Admission: RE | Admit: 2018-01-31 | Discharge: 2018-01-31 | Disposition: A | Discharge status: Home / Self Care | Payer: BC Managed Care – PPO | Source: Ambulatory Visit | Attending: Orthopaedic Surgery | Admitting: Orthopaedic Surgery

## 2018-01-31 ENCOUNTER — Encounter (HOSPITAL_COMMUNITY): Payer: Self-pay

## 2018-01-31 DIAGNOSIS — Z01818 Encounter for other preprocedural examination: Secondary | ICD-10-CM | POA: Insufficient documentation

## 2018-01-31 HISTORY — DX: Other specified postprocedural states: Z98.890

## 2018-01-31 HISTORY — DX: Nausea with vomiting, unspecified: R11.2

## 2018-01-31 HISTORY — DX: Adverse effect of unspecified anesthetic, initial encounter: T41.45XA

## 2018-01-31 HISTORY — DX: Other complications of anesthesia, initial encounter: T88.59XA

## 2018-01-31 LAB — BASIC METABOLIC PANEL
ANION GAP: 12 (ref 5–15)
BUN: 16 mg/dL (ref 6–20)
CALCIUM: 9.2 mg/dL (ref 8.9–10.3)
CO2: 23 mmol/L (ref 22–32)
CREATININE: 1.04 mg/dL — AB (ref 0.44–1.00)
Chloride: 102 mmol/L (ref 98–111)
Glucose, Bld: 221 mg/dL — ABNORMAL HIGH (ref 70–99)
Potassium: 3.6 mmol/L (ref 3.5–5.1)
SODIUM: 137 mmol/L (ref 135–145)

## 2018-01-31 LAB — CBC
HCT: 46.2 % — ABNORMAL HIGH (ref 36.0–46.0)
Hemoglobin: 14.5 g/dL (ref 12.0–15.0)
MCH: 28.1 pg (ref 26.0–34.0)
MCHC: 31.4 g/dL (ref 30.0–36.0)
MCV: 89.5 fL (ref 80.0–100.0)
NRBC: 0 % (ref 0.0–0.2)
PLATELETS: 310 10*3/uL (ref 150–400)
RBC: 5.16 MIL/uL — AB (ref 3.87–5.11)
RDW: 13.2 % (ref 11.5–15.5)
WBC: 7.7 10*3/uL (ref 4.0–10.5)

## 2018-01-31 LAB — GLUCOSE, CAPILLARY: GLUCOSE-CAPILLARY: 215 mg/dL — AB (ref 70–99)

## 2018-01-31 LAB — HEMOGLOBIN A1C
HEMOGLOBIN A1C: 10.3 % — AB (ref 4.8–5.6)
Mean Plasma Glucose: 248.91 mg/dL

## 2018-01-31 NOTE — Progress Notes (Addendum)
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 She is followed by her PCP and neurology.  They are still doing studies.

## 2018-02-01 NOTE — Anesthesia Preprocedure Evaluation (Addendum)
Anesthesia Evaluation  Patient identified by MRN, date of birth, ID band Patient awake    Reviewed: Allergy & Precautions, NPO status , Patient's Chart, lab work & pertinent test results, reviewed documented beta blocker date and time   History of Anesthesia Complications (+) PONVNegative for: history of anesthetic complications  Airway Mallampati: III  TM Distance: >3 FB Neck ROM: Full    Dental no notable dental hx.    Pulmonary asthma , sleep apnea ,    Pulmonary exam normal        Cardiovascular hypertension, Pt. on medications and Pt. on home beta blockers Normal cardiovascular exam     Neuro/Psych negative neurological ROS  negative psych ROS   GI/Hepatic Neg liver ROS, GERD  ,  Endo/Other  diabetes, Poorly Controlled, Insulin DependentMorbid obesity  Renal/GU negative Renal ROS  negative genitourinary   Musculoskeletal negative musculoskeletal ROS (+)   Abdominal (+) + obese,   Peds  Hematology negative hematology ROS (+)   Anesthesia Other Findings   Reproductive/Obstetrics                           Anesthesia Physical Anesthesia Plan  ASA: III  Anesthesia Plan: General   Post-op Pain Management:    Induction: Intravenous  PONV Risk Score and Plan: 4 or greater and Ondansetron, Dexamethasone, Propofol infusion, Midazolam and Treatment may vary due to age or medical condition  Airway Management Planned: LMA  Additional Equipment: None  Intra-op Plan:   Post-operative Plan: Extubation in OR  Informed Consent: I have reviewed the patients History and Physical, chart, labs and discussed the procedure including the risks, benefits and alternatives for the proposed anesthesia with the patient or authorized representative who has indicated his/her understanding and acceptance.     Plan Discussed with:   Anesthesia Plan Comments: (Morbid obesity with bilateral LE weakness  of unclear etiology, now wheelchair bound.)       Anesthesia Quick Evaluation

## 2018-02-04 NOTE — H&P (Signed)
[name] is an 52 y.o. female.   Chief Complaint: Right arm pain HPI: [name] is in again about her right hand.  She continues with the numbness and the dysfunction. He has undergone a nerve conduction study by Dr. Mina Marble which shows ulnar nerve compression at the elbow. Her medical coverage remains through [name].    Nerve conduction study: I reviewed a study done here by Dr. Mina Marble on 01/02/18 which shows compression of the ulnar nerve at the cubital tunnel.  Past Medical History:  Diagnosis Date  . Allergy   . Anemia   . Anxiety   . Arthritis   . Asthma   . Colon polyp   . Complication of anesthesia   . Diabetes mellitus   . Edema   . Fatigue   . Hypertension   . IBS (irritable bowel syndrome)   . Lump in female breast   . Methicillin resistant Staphylococcus aureus in conditions classified elsewhere and of unspecified site   . Migraines   . Morbid obesity ([place])   . Night sweats   . Nonspecific abnormal results of thyroid function study   . Other B-complex deficiencies   . Paresthesias   . PONV (postoperative nausea and vomiting)   . Pure hypercholesterolemia   . Reflux   . Sacral fracture ([place])   . Sinus problem   . Symptomatic states associated with artificial menopause   . Thyroid disease   . Type II or unspecified type diabetes mellitus without mention of complication, not stated as uncontrolled   . Unspecified sleep apnea     Past Surgical History:  Procedure Laterality Date  . abcess removal  1997   MRSA  . ABDOMINAL HYSTERECTOMY  1997   complete in 1997, partial was in 1995  . carpal tunnel release r  07/03/13   [facility]  . [place]  . CHOLECYSTECTOMY  03/1989  . COLONOSCOPY  03/21/2011   Normal.  [facility].  [name] KNEE SURGERY  2006  . LAPAROSCOPIC GASTRIC BANDING  05/2009  . TONSILLECTOMY AND ADENOIDECTOMY  1974  . TUBAL LIGATION      Family History  Problem Relation Age of Onset  . Diabetes Mother   . Heart disease Mother  42       CHF, CAD  . Other Mother        muscle disease  . Hypertension Mother   . Hyperlipidemia Mother   . Arthritis Mother        OA hip L s/p THR  . Diabetes Father   . Heart disease Father        AMI/CABG/valve replacement age 35.  . Stroke Father   . Diabetes Brother   . Heart disease Brother        stent age 13.  . Fibromyalgia Daughter   . Crohn's disease Daughter   . Hypertension Son   . Alzheimer's disease Maternal Grandmother   . Emphysema Maternal Grandfather   . Heart disease Paternal Grandmother   . Breast cancer Unknown        2 Aunts   Social History:  reports that she has never smoked. She has never used smokeless tobacco. She reports that she drinks alcohol. She reports that she does not use drugs.  Allergies:  Allergies  Allergen Reactions  . Amoxicillin Shortness Of Breath and Rash  . Bee Venom Anaphylaxis  . Levofloxacin Shortness Of Breath and Rash  . Meloxicam Hives and Rash  . Vancomycin  Anaphylaxis  . Sulfa Antibiotics Rash  . Ace Inhibitors Cough  . Codeine   . Meperidine Hcl   . Oxycodone-Acetaminophen   . Penicillins Swelling and Rash    Has patient had a PCN reaction causing immediate rash, facial/tongue/throat swelling, SOB or lightheadedness with hypotension: Yes Has patient had a PCN reaction causing severe rash involving mucus membranes or skin necrosis: Yes Has patient had a PCN reaction that required hospitalization: No Has patient had a PCN reaction occurring within the last 10 years: No If all of the above answers are "NO", then may proceed with Cephalosporin use.   Flo Shanks [Gatifloxacin] Rash    No medications prior to admission.    No results found for this or any previous visit (from the past 48 hour(s)). No results found.  Review of Systems  Musculoskeletal: Positive for joint pain.       Right arm  All other systems reviewed and are negative.   There were no vitals taken for this visit. Physical Exam   Constitutional: She is oriented to person, place, and time. She appears well-developed and well-nourished.  HENT:  Head: Normocephalic and atraumatic.  Eyes: Pupils are equal, round, and reactive to light.  Neck: Normal range of motion.  Cardiovascular: Normal rate and regular rhythm.  Respiratory: Effort normal.  GI: Soft.  Musculoskeletal:  Right hand is still held in position of benediction.  She has some significant atrophy of first interspace musculature.  She has some grip strength weakness.    Neurological: She is alert and oriented to person, place, and time.  Skin: Skin is warm and dry.  Psychiatric: She has a normal mood and affect. Her behavior is normal. Judgment and thought content normal.     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  More significantly she is losing muscle mass in her hand.  She cannot walk at this point and her hands are particularly important to her.  I think we better go forward with a decompression at the elbow.  I reviewed risk of anesthesia, infection, neurovascular injury and also stressed that this is not an immediate cure and her function will take many months to return.   Bram Hottel, Larwance Sachs, PA-C 02/04/2018, 2:47 PM

## 2018-02-05 ENCOUNTER — Encounter (HOSPITAL_COMMUNITY)
Admission: RE | Disposition: A | Discharge status: Home / Self Care | Payer: Self-pay | Source: Ambulatory Visit | Attending: Orthopaedic Surgery

## 2018-02-05 ENCOUNTER — Ambulatory Visit (HOSPITAL_COMMUNITY): Payer: BC Managed Care – PPO | Admitting: Physician Assistant

## 2018-02-05 ENCOUNTER — Encounter (HOSPITAL_COMMUNITY): Payer: Self-pay | Admitting: *Deleted

## 2018-02-05 ENCOUNTER — Ambulatory Visit (HOSPITAL_COMMUNITY): Payer: BC Managed Care – PPO | Admitting: Anesthesiology

## 2018-02-05 ENCOUNTER — Ambulatory Visit (HOSPITAL_COMMUNITY)
Admission: RE | Admit: 2018-02-05 | Discharge: 2018-02-05 | Disposition: A | Discharge status: Home / Self Care | Payer: BC Managed Care – PPO | Source: Ambulatory Visit | Attending: Orthopaedic Surgery | Admitting: Orthopaedic Surgery

## 2018-02-05 DIAGNOSIS — K589 Irritable bowel syndrome without diarrhea: Secondary | ICD-10-CM | POA: Diagnosis not present

## 2018-02-05 DIAGNOSIS — J45909 Unspecified asthma, uncomplicated: Secondary | ICD-10-CM | POA: Diagnosis not present

## 2018-02-05 DIAGNOSIS — Z794 Long term (current) use of insulin: Secondary | ICD-10-CM | POA: Insufficient documentation

## 2018-02-05 DIAGNOSIS — E079 Disorder of thyroid, unspecified: Secondary | ICD-10-CM | POA: Insufficient documentation

## 2018-02-05 DIAGNOSIS — E119 Type 2 diabetes mellitus without complications: Secondary | ICD-10-CM | POA: Insufficient documentation

## 2018-02-05 DIAGNOSIS — Z880 Allergy status to penicillin: Secondary | ICD-10-CM | POA: Diagnosis not present

## 2018-02-05 DIAGNOSIS — Z79899 Other long term (current) drug therapy: Secondary | ICD-10-CM | POA: Diagnosis not present

## 2018-02-05 DIAGNOSIS — G5621 Lesion of ulnar nerve, right upper limb: Secondary | ICD-10-CM | POA: Insufficient documentation

## 2018-02-05 DIAGNOSIS — K219 Gastro-esophageal reflux disease without esophagitis: Secondary | ICD-10-CM | POA: Insufficient documentation

## 2018-02-05 DIAGNOSIS — Z888 Allergy status to other drugs, medicaments and biological substances status: Secondary | ICD-10-CM | POA: Diagnosis not present

## 2018-02-05 DIAGNOSIS — Z6841 Body Mass Index (BMI) 40.0 and over, adult: Secondary | ICD-10-CM | POA: Diagnosis not present

## 2018-02-05 DIAGNOSIS — I10 Essential (primary) hypertension: Secondary | ICD-10-CM | POA: Diagnosis not present

## 2018-02-05 DIAGNOSIS — Z881 Allergy status to other antibiotic agents status: Secondary | ICD-10-CM | POA: Diagnosis not present

## 2018-02-05 DIAGNOSIS — Z885 Allergy status to narcotic agent status: Secondary | ICD-10-CM | POA: Diagnosis not present

## 2018-02-05 DIAGNOSIS — F419 Anxiety disorder, unspecified: Secondary | ICD-10-CM | POA: Insufficient documentation

## 2018-02-05 DIAGNOSIS — E7800 Pure hypercholesterolemia, unspecified: Secondary | ICD-10-CM | POA: Insufficient documentation

## 2018-02-05 DIAGNOSIS — Z882 Allergy status to sulfonamides status: Secondary | ICD-10-CM | POA: Insufficient documentation

## 2018-02-05 HISTORY — PX: ULNAR TUNNEL RELEASE: SHX820

## 2018-02-05 LAB — GLUCOSE, CAPILLARY
GLUCOSE-CAPILLARY: 156 mg/dL — AB (ref 70–99)
GLUCOSE-CAPILLARY: 135 mg/dL — AB (ref 70–99)
Glucose-Capillary: 236 mg/dL — ABNORMAL HIGH (ref 70–99)

## 2018-02-05 SURGERY — RELEASE, CUBITAL TUNNEL
Anesthesia: General | Laterality: Right

## 2018-02-05 MED ORDER — LIDOCAINE 2% (20 MG/ML) 5 ML SYRINGE
INTRAMUSCULAR | Status: DC | PRN
  Administered 2018-02-05: 100 mg via INTRAVENOUS

## 2018-02-05 MED ORDER — SODIUM CHLORIDE 0.9 % IV SOLN
INTRAVENOUS | Status: DC | PRN
  Administered 2018-02-05: 70 ug/min via INTRAVENOUS

## 2018-02-05 MED ORDER — FENTANYL CITRATE (PF) 250 MCG/5ML IJ SOLN
INTRAMUSCULAR | Status: AC
  Filled 2018-02-05: qty 5

## 2018-02-05 MED ORDER — PROPOFOL 10 MG/ML IV BOLUS
INTRAVENOUS | Status: DC | PRN
  Administered 2018-02-05: 200 mg via INTRAVENOUS

## 2018-02-05 MED ORDER — OXYCODONE HCL 5 MG PO TABS: 5.0000 mg | ORAL_TABLET | Freq: Once | ORAL | Status: DC | PRN

## 2018-02-05 MED ORDER — DEXAMETHASONE SODIUM PHOSPHATE 10 MG/ML IJ SOLN
INTRAMUSCULAR | Status: DC | PRN
  Administered 2018-02-05: 10 mg via INTRAVENOUS

## 2018-02-05 MED ORDER — PROPOFOL 10 MG/ML IV BOLUS
INTRAVENOUS | Status: AC
  Filled 2018-02-05: qty 20

## 2018-02-05 MED ORDER — CLINDAMYCIN PHOSPHATE 900 MG/50ML IV SOLN
INTRAVENOUS | Status: AC
  Filled 2018-02-05: qty 50

## 2018-02-05 MED ORDER — PHENYLEPHRINE HCL 10 MG/ML IJ SOLN
INTRAMUSCULAR | Status: DC | PRN
  Administered 2018-02-05: 80 ug via INTRAVENOUS
  Administered 2018-02-05: 160 ug via INTRAVENOUS
  Administered 2018-02-05 (×2): 80 ug via INTRAVENOUS

## 2018-02-05 MED ORDER — ONDANSETRON HCL 4 MG/2ML IJ SOLN
INTRAMUSCULAR | Status: DC | PRN
  Administered 2018-02-05: 4 mg via INTRAVENOUS

## 2018-02-05 MED ORDER — 0.9 % SODIUM CHLORIDE (POUR BTL) OPTIME
TOPICAL | Status: DC | PRN
  Administered 2018-02-05: 1000 mL

## 2018-02-05 MED ORDER — FENTANYL CITRATE (PF) 100 MCG/2ML IJ SOLN
INTRAMUSCULAR | Status: DC | PRN
  Administered 2018-02-05: 50 ug via INTRAVENOUS

## 2018-02-05 MED ORDER — EPHEDRINE SULFATE 50 MG/ML IJ SOLN
INTRAMUSCULAR | Status: DC | PRN
  Administered 2018-02-05: 10 mg via INTRAVENOUS

## 2018-02-05 MED ORDER — BUPIVACAINE-EPINEPHRINE (PF) 0.25% -1:200000 IJ SOLN
INTRAMUSCULAR | Status: DC | PRN
  Administered 2018-02-05: 9 mL via PERINEURAL

## 2018-02-05 MED ORDER — CLINDAMYCIN PHOSPHATE 900 MG/50ML IV SOLN
900.0000 mg | INTRAVENOUS | Status: AC
  Administered 2018-02-05: 900 mg via INTRAVENOUS

## 2018-02-05 MED ORDER — FENTANYL CITRATE (PF) 100 MCG/2ML IJ SOLN: 25.0000 ug | INTRAMUSCULAR | Status: DC | PRN

## 2018-02-05 MED ORDER — LACTATED RINGERS IV SOLN
INTRAVENOUS | Status: DC | PRN
  Administered 2018-02-05: 11:00:00 via INTRAVENOUS

## 2018-02-05 MED ORDER — OXYCODONE HCL 5 MG/5ML PO SOLN: 5.0000 mg | Freq: Once | ORAL | Status: DC | PRN

## 2018-02-05 MED ORDER — HYDROCODONE-ACETAMINOPHEN 5-325 MG PO TABS: 1.0000 | ORAL_TABLET | Freq: Four times a day (QID) | ORAL | 0 refills | Status: DC | PRN

## 2018-02-05 MED ORDER — MIDAZOLAM HCL 2 MG/2ML IJ SOLN
INTRAMUSCULAR | Status: AC
  Filled 2018-02-05: qty 2

## 2018-02-05 MED ORDER — MIDAZOLAM HCL 5 MG/5ML IJ SOLN
INTRAMUSCULAR | Status: DC | PRN
  Administered 2018-02-05: 2 mg via INTRAVENOUS

## 2018-02-05 MED ORDER — ONDANSETRON HCL 4 MG/2ML IJ SOLN: 4.0000 mg | Freq: Once | INTRAMUSCULAR | Status: DC | PRN

## 2018-02-05 MED ORDER — CHLORHEXIDINE GLUCONATE 4 % EX LIQD: 60.0000 mL | Freq: Once | CUTANEOUS | Status: DC

## 2018-02-05 MED ORDER — LACTATED RINGERS IV SOLN
INTRAVENOUS | Status: DC
  Administered 2018-02-05: 11:00:00 via INTRAVENOUS

## 2018-02-05 MED ORDER — BUPIVACAINE-EPINEPHRINE (PF) 0.25% -1:200000 IJ SOLN
INTRAMUSCULAR | Status: AC
  Filled 2018-02-05: qty 30

## 2018-02-05 SURGICAL SUPPLY — 52 items
BANDAGE ACE 3X5.8 VEL STRL LF (GAUZE/BANDAGES/DRESSINGS) ×6 IMPLANT
BANDAGE ACE 4X5 VEL STRL LF (GAUZE/BANDAGES/DRESSINGS) ×5 IMPLANT
BNDG CMPR 9X4 STRL LF SNTH (GAUZE/BANDAGES/DRESSINGS) ×1
BNDG ESMARK 4X9 LF (GAUZE/BANDAGES/DRESSINGS) ×2 IMPLANT
BNDG GAUZE ELAST 4 BULKY (GAUZE/BANDAGES/DRESSINGS) ×3 IMPLANT
CLOSURE STERI-STRIP 1/2X4 (GAUZE/BANDAGES/DRESSINGS) ×1
CLOSURE WOUND 1/2 X4 (GAUZE/BANDAGES/DRESSINGS) ×1
CLSR STERI-STRIP ANTIMIC 1/2X4 (GAUZE/BANDAGES/DRESSINGS) ×1 IMPLANT
CORDS BIPOLAR (ELECTRODE) ×3 IMPLANT
COVER SURGICAL LIGHT HANDLE (MISCELLANEOUS) ×3 IMPLANT
COVER WAND RF STERILE (DRAPES) ×3 IMPLANT
CUFF TOURNIQUET SINGLE 18IN (TOURNIQUET CUFF) ×3 IMPLANT
CUFF TOURNIQUET SINGLE 24IN (TOURNIQUET CUFF) IMPLANT
DRAPE SURG 17X23 STRL (DRAPES) ×3 IMPLANT
DRSG EMULSION OIL 3X3 NADH (GAUZE/BANDAGES/DRESSINGS) ×4 IMPLANT
GAUZE SPONGE 4X4 12PLY STRL (GAUZE/BANDAGES/DRESSINGS) ×3 IMPLANT
GAUZE XEROFORM 1X8 LF (GAUZE/BANDAGES/DRESSINGS) ×3 IMPLANT
GLOVE BIO SURGEON STRL SZ8 (GLOVE) ×3 IMPLANT
GLOVE BIOGEL PI IND STRL 8 (GLOVE) ×2 IMPLANT
GLOVE BIOGEL PI INDICATOR 8 (GLOVE) ×4
GLOVE SS N UNI LF 8.0 STRL (GLOVE) ×3 IMPLANT
GLOVE SURG SS PI 6.5 STRL IVOR (GLOVE) ×2 IMPLANT
GLOVE SURG SS PI 7.0 STRL IVOR (GLOVE) ×2 IMPLANT
GOWN STRL REUS W/ TWL LRG LVL3 (GOWN DISPOSABLE) ×1 IMPLANT
GOWN STRL REUS W/ TWL XL LVL3 (GOWN DISPOSABLE) ×2 IMPLANT
GOWN STRL REUS W/TWL LRG LVL3 (GOWN DISPOSABLE) ×3
GOWN STRL REUS W/TWL XL LVL3 (GOWN DISPOSABLE) ×6
KIT BASIN OR (CUSTOM PROCEDURE TRAY) ×3 IMPLANT
KIT TURNOVER KIT B (KITS) ×3 IMPLANT
LOOP VESSEL MAXI BLUE (MISCELLANEOUS) IMPLANT
NDL HYPO 25GX1X1/2 BEV (NEEDLE) IMPLANT
NDL HYPO 25X1 1.5 SAFETY (NEEDLE) IMPLANT
NEEDLE HYPO 25GX1X1/2 BEV (NEEDLE) IMPLANT
NEEDLE HYPO 25X1 1.5 SAFETY (NEEDLE) ×3 IMPLANT
NS IRRIG 1000ML POUR BTL (IV SOLUTION) ×3 IMPLANT
PACK ORTHO EXTREMITY (CUSTOM PROCEDURE TRAY) ×3 IMPLANT
PAD ABD 8X10 STRL (GAUZE/BANDAGES/DRESSINGS) ×2 IMPLANT
PAD ARMBOARD 7.5X6 YLW CONV (MISCELLANEOUS) ×6 IMPLANT
PAD CAST 4YDX4 CTTN HI CHSV (CAST SUPPLIES) ×2 IMPLANT
PADDING CAST COTTON 4X4 STRL (CAST SUPPLIES) ×6
STRIP CLOSURE SKIN 1/2X4 (GAUZE/BANDAGES/DRESSINGS) ×1 IMPLANT
SUT ETHILON 3 0 PS 1 (SUTURE) IMPLANT
SUT MON AB 3-0 SH 27 (SUTURE) ×3
SUT MON AB 3-0 SH27 (SUTURE) IMPLANT
SUT VIC AB 2-0 SH 27 (SUTURE) ×3
SUT VIC AB 2-0 SH 27X BRD (SUTURE) IMPLANT
SYR CONTROL 10ML LL (SYRINGE) ×2 IMPLANT
SYSTEM CHEST DRAIN TLS 7FR (DRAIN) IMPLANT
TOWEL OR 17X26 10 PK STRL BLUE (TOWEL DISPOSABLE) ×3 IMPLANT
TUBE CONNECTING 12'X1/4 (SUCTIONS)
TUBE CONNECTING 12X1/4 (SUCTIONS) IMPLANT
UNDERPAD 30X30 (UNDERPADS AND DIAPERS) ×3 IMPLANT

## 2018-02-05 NOTE — Anesthesia Procedure Notes (Signed)
Procedure Name: LMA Insertion Date/Time: 02/05/2018 12:49 PM Performed by: Leonor Liv, CRNA Pre-anesthesia Checklist: Patient identified, Emergency Drugs available, Suction available and Patient being monitored Patient Re-evaluated:Patient Re-evaluated prior to induction Oxygen Delivery Method: Circle System Utilized Preoxygenation: Pre-oxygenation with 100% oxygen Induction Type: IV induction Ventilation: Mask ventilation without difficulty LMA: LMA inserted LMA Size: 4.0 Number of attempts: 1 Airway Equipment and Method: Bite block Placement Confirmation: positive ETCO2 Tube secured with: Tape Dental Injury: Teeth and Oropharynx as per pre-operative assessment

## 2018-02-05 NOTE — Anesthesia Postprocedure Evaluation (Signed)
Anesthesia Post Note  Patient: [name]  Procedure(s) Performed: CUBITAL TUNNEL RELEASE/DECOMPRESSION (Right )     Patient location during evaluation: PACU Anesthesia Type: General Level of consciousness: awake and alert Pain management: pain level controlled Vital Signs Assessment: post-procedure vital signs reviewed and stable Respiratory status: spontaneous breathing, nonlabored ventilation and respiratory function stable Cardiovascular status: blood pressure returned to baseline and stable Postop Assessment: no apparent nausea or vomiting Anesthetic complications: no    Last Vitals:  Vitals:   02/05/18 1425 02/05/18 1440  BP: 120/68 118/64  Pulse: 86 83  Resp: 19 19  Temp:  (!) 36.4 C  SpO2: 94% 98%    Last Pain:  Vitals:   02/05/18 1440  TempSrc:   PainSc: 1                  [name]

## 2018-02-05 NOTE — Op Note (Signed)
[name] [id number] 02/05/2018   PRE-OP DIAGNOSIS: right cubital tunnel syndrome  POST-OP DIAGNOSIS: same  PROCEDURE: right cubital tunnel decompression  ANESTHESIA: general  [name]   Dictation #:  [id number]

## 2018-02-05 NOTE — Op Note (Signed)
NAMEGRACELYNN, [name] MEDICAL RECORD [id number] ACCOUNT [id number] DATE OF BIRTH:03-13-1966 FACILITY: [facility] LOCATION: [facility] PHYSICIAN:Joban Colledge Autumn Patty, MD  OPERATIVE REPORT  DATE OF PROCEDURE:  02/05/2018  PREOPERATIVE DIAGNOSIS:  Right cubital tunnel syndrome.  POSTOPERATIVE DIAGNOSIS:  Right cubital tunnel syndrome.  PROCEDURE:  Right cubital tunnel decompression.  ANESTHESIA:  General.  ATTENDING SURGEON:  Melrose Nakayama, MD  ASSISTANT:  Loni Dolly, PA.  INDICATIONS:  The patient is a 52 year old woman with a fairly complicated neurologic history.  Of recent, she has had trouble with dysfunction of her right hand.  She holds it in position of benediction.  Nerve conduction studies confirmed ulnar  entrapment at the cubital tunnel.  She is offered a simple decompression at this time as she really did not have any pain at the elbow itself and the nerve did not appear to sublux.  Informed operative consent was obtained after discussion of possible  complications including reaction to anesthesia, infection, neurovascular injury and failure of function to return.  SUMMARY OF FINDINGS AND PROCEDURE:  Under general anesthesia through a small posteromedial incision, we performed an ulnar nerve decompression at the cubital tunnel.  She had some very thick fascial bands which were compressing the nerve through the bony  tunnel, but also into the FCU.  The nerve was very irritable and obviously compressed.  She was closed primarily and discharged home.  DESCRIPTION OF PROCEDURE:  The patient was taken to the operating suite where general anesthetic was applied without difficulty.  She was positioned supine and prepped and draped in normal sterile fashion.  After the administration of preoperative IV  clindamycin and a timeout, the right arm was elevated, exsanguinated, and a tourniquet inflated about the upper arm.  I made a posteromedial incision with dissection down to the cubital  tunnel.  I found the nerve proximally just above the bony tunnel.  I  then decompressed proximally up to muscle septum.  I then took the dissection distally through some very thick fascial bands into the FCU muscle.  The nerve was very irritable in this area and with dissection the hand would move.  This seemed consistent  with severe compression.  Once this was accomplished, we irrigated the wound and released the tourniquet.  A small amount of bleeding was easily controlled with Bovie cautery.  The elbow was flexed and extended and the nerve stayed in place and did not  sublux.  The wound was again irrigated followed by reapproximation of deep tissues with Vicryl and skin with a running subcuticular stitch and Steri-Strips.  We injected some Marcaine with epinephrine about the incision prior to closure.  Adaptic was  applied followed by dry gauze and a loose Ace wrap.  ESTIMATED BLOOD LOSS AND FLUIDS:  Taken from anesthesia records as can accurate tourniquet time.  DISPOSITION:  The patient was extubated in the operating room, taken to recovery room in stable condition.  Plans were for her to go home the same day and follow up in the office last week.  I will contact her by phone tonight.  TN/NUANCE  D:02/05/2018 T:02/05/2018 JOB:[id number]

## 2018-02-05 NOTE — Transfer of Care (Signed)
Immediate Anesthesia Transfer of Care Note  Patient: [name]  Procedure(s) Performed: CUBITAL TUNNEL RELEASE/DECOMPRESSION (Right )  Patient Location: PACU  Anesthesia Type:General  Level of Consciousness: awake, alert  and oriented  Airway & Oxygen Therapy: Patient Spontanous Breathing and Patient connected to nasal cannula oxygen  Post-op Assessment: Report given to RN, Post -op Vital signs reviewed and stable and Patient moving all extremities  Post vital signs: Reviewed and stable  Last Vitals:  Vitals Value Taken Time  BP 128/61 02/05/2018  1:40 PM  Temp    Pulse 89 02/05/2018  1:41 PM  Resp 9 02/05/2018  1:41 PM  SpO2 98 % 02/05/2018  1:41 PM  Vitals shown include unvalidated device data.  Last Pain:  Vitals:   02/05/18 1105  TempSrc:   PainSc: 0-No pain         Complications: No apparent anesthesia complications

## 2018-02-06 ENCOUNTER — Encounter (HOSPITAL_COMMUNITY): Payer: Self-pay | Admitting: Orthopaedic Surgery

## 2018-02-07 ENCOUNTER — Other Ambulatory Visit: Payer: Self-pay | Admitting: Primary Care

## 2018-02-07 DIAGNOSIS — Z794 Long term (current) use of insulin: Principal | ICD-10-CM

## 2018-02-07 DIAGNOSIS — E119 Type 2 diabetes mellitus without complications: Secondary | ICD-10-CM

## 2018-02-07 NOTE — Telephone Encounter (Signed)
Last prescribed on 04/09/2017 Last office visit on 10/01/2017

## 2018-02-07 NOTE — Telephone Encounter (Signed)
Noted.  Refill sent to pharmacy.  Patient is due for diabetes follow-up in December, my chart message sent to patient with this request.

## 2018-02-20 ENCOUNTER — Other Ambulatory Visit: Payer: Self-pay | Admitting: Primary Care

## 2018-02-20 DIAGNOSIS — I10 Essential (primary) hypertension: Secondary | ICD-10-CM

## 2018-02-22 ENCOUNTER — Other Ambulatory Visit: Payer: Self-pay | Admitting: Primary Care

## 2018-02-22 DIAGNOSIS — E119 Type 2 diabetes mellitus without complications: Secondary | ICD-10-CM

## 2018-02-22 DIAGNOSIS — Z794 Long term (current) use of insulin: Principal | ICD-10-CM

## 2018-02-22 NOTE — Telephone Encounter (Signed)
Spoken to patient and she stated that is fine. We can wait until 12/11

## 2018-02-22 NOTE — Telephone Encounter (Signed)
Last prescribed on 11/20/2017. Last seen on 10/01/2017

## 2018-02-22 NOTE — Telephone Encounter (Signed)
Can she wait until see on 12/11? I may increase the dose.

## 2018-02-22 NOTE — Telephone Encounter (Signed)
Received faxed refill request. Last prescribed on 11/20/2017. Last seen on 10/01/2017

## 2018-02-22 NOTE — Telephone Encounter (Signed)
She has a follow up visit scheduled for 12/11. Is she okay to wait until then for a refill? I may increase the dose.

## 2018-02-23 ENCOUNTER — Other Ambulatory Visit: Payer: Self-pay | Admitting: Primary Care

## 2018-02-23 DIAGNOSIS — I10 Essential (primary) hypertension: Secondary | ICD-10-CM

## 2018-02-23 DIAGNOSIS — R000 Tachycardia, unspecified: Secondary | ICD-10-CM

## 2018-02-25 NOTE — Telephone Encounter (Signed)
Noted.  Will evaluate patient later this week during office visit.  Refill sent to pharmacy.

## 2018-02-25 NOTE — Telephone Encounter (Signed)
Ok to refill? Last prescribed on 12/27/2017  Last office visit on 10/01/2017. Next follow up on 02/27/2018

## 2018-02-27 ENCOUNTER — Encounter: Payer: Self-pay | Admitting: Primary Care

## 2018-02-27 ENCOUNTER — Ambulatory Visit (INDEPENDENT_AMBULATORY_CARE_PROVIDER_SITE_OTHER): Payer: BC Managed Care – PPO | Admitting: Primary Care

## 2018-02-27 VITALS — BP 130/80 | HR 77 | Temp 98.2°F

## 2018-02-27 DIAGNOSIS — R29898 Other symptoms and signs involving the musculoskeletal system: Secondary | ICD-10-CM

## 2018-02-27 DIAGNOSIS — R000 Tachycardia, unspecified: Secondary | ICD-10-CM

## 2018-02-27 DIAGNOSIS — I10 Essential (primary) hypertension: Secondary | ICD-10-CM | POA: Diagnosis not present

## 2018-02-27 DIAGNOSIS — E119 Type 2 diabetes mellitus without complications: Secondary | ICD-10-CM

## 2018-02-27 DIAGNOSIS — R262 Difficulty in walking, not elsewhere classified: Secondary | ICD-10-CM

## 2018-02-27 DIAGNOSIS — R296 Repeated falls: Secondary | ICD-10-CM

## 2018-02-27 DIAGNOSIS — E1165 Type 2 diabetes mellitus with hyperglycemia: Secondary | ICD-10-CM

## 2018-02-27 DIAGNOSIS — Z794 Long term (current) use of insulin: Secondary | ICD-10-CM

## 2018-02-27 DIAGNOSIS — E1140 Type 2 diabetes mellitus with diabetic neuropathy, unspecified: Secondary | ICD-10-CM | POA: Diagnosis not present

## 2018-02-27 DIAGNOSIS — J309 Allergic rhinitis, unspecified: Secondary | ICD-10-CM

## 2018-02-27 DIAGNOSIS — J4520 Mild intermittent asthma, uncomplicated: Secondary | ICD-10-CM

## 2018-02-27 MED ORDER — GLIPIZIDE ER 10 MG PO TB24: 10.0000 mg | ORAL_TABLET | Freq: Every day | ORAL | 3 refills | Status: DC

## 2018-02-27 MED ORDER — GABAPENTIN 300 MG PO CAPS: 600.0000 mg | ORAL_CAPSULE | Freq: Two times a day (BID) | ORAL | 3 refills | Status: DC

## 2018-02-27 MED ORDER — METOPROLOL TARTRATE 25 MG PO TABS: 12.5000 mg | ORAL_TABLET | Freq: Two times a day (BID) | ORAL | 3 refills | Status: DC

## 2018-02-27 MED ORDER — DULAGLUTIDE 1.5 MG/0.5ML ~~LOC~~ SOAJ: SUBCUTANEOUS | 3 refills | Status: DC

## 2018-02-27 NOTE — Assessment & Plan Note (Signed)
Making progress overall. Referral placed for outpatient PT.

## 2018-02-27 NOTE — Assessment & Plan Note (Signed)
Stable in the office today, continue current regimen. Refill for metoprolol sent to pharmacy as requested.

## 2018-02-27 NOTE — Assessment & Plan Note (Signed)
Intermittent symptoms, doing well on current regimen.

## 2018-02-27 NOTE — Assessment & Plan Note (Signed)
No wheezing on exam, no recent use of albuterol inhaler.

## 2018-02-27 NOTE — Progress Notes (Signed)
Subjective:    Patient ID: [name], female    DOB: December 10, 1965, 52 y.o.   MRN: [id number]  HPI  [name] is a 52 year old female who presents today for follow up.  1) Type 2 Diabetes:  Current medications include: Levemir 38 units BID, Trulicity 0.34 mg weekly, glipizide-metformin 2.5-500 mg BID. She stopped taking her glipizide-metformin several months ago due to diarrhea. Now that she is total care and her family is assisting with toileting, the diarrhea has been very problematic.   She is checking her blood glucose once weekly and is getting readings of: AM fasting: low 200's.   Last A1C: 7.9 in June 2019, 10.3 in November 2019 Last Eye Exam: No exam in 2019. Does not have eye insurance. Last Foot Exam: Due Pneumonia Vaccination: Completed in 2018 ACE/ARB: Losartan Statin: atorvastatin  2) Essential Hypertension: Currently managed on HCTZ 25 mg, losartan 100 mg, metoprolol tartrate 25 mg BID. She denies chest pain, dizziness, shortness of breath.  BP Readings from Last 3 Encounters:  02/27/18 130/80  02/05/18 118/64  01/31/18 (!) 107/52   3) Neuropathy/Chronic Lower Extremity Weakness/Lumbar Disc Disease: Currently following with neurology. She has progressed and can walk 50 feet and pivot within her home, uses walker. She has regained some sensation to her left lower extremity which is intermittent. She continues to be mostly total care, requires assistance with bathing, toileting, cleaning, cooking, etc.  She was involved with home physical therapy for which she completed from June until recently. She is needing a referral to outpatient physical therapy, [facility].  Her gabapentin dose was increased to 600 mg BID per her neurologist through [facility].  4) Asthma/Allergic Rhinitis: Currently managed on albuterol HFA inhaler, Flonase, Xyzal. No recent use of albuterol inhaler. She does have intermittent sinus pressure over the last 2 days. Denies fevers and  cough.   Review of Systems  HENT: Positive for sinus pressure. Negative for congestion.   Respiratory: Negative for shortness of breath and wheezing.   Cardiovascular: Negative for chest pain.  Allergic/Immunologic: Positive for environmental allergies.  Neurological: Negative for dizziness and headaches.       Chronic bilateral lower extremity weakness.       Past Medical History:  Diagnosis Date  . Allergy   . Anemia   . Anxiety   . Arthritis   . Asthma   . Colon polyp   . Complication of anesthesia   . Diabetes mellitus   . Edema   . Fatigue   . Hypertension   . IBS (irritable bowel syndrome)   . Lump in female breast   . Methicillin resistant Staphylococcus aureus in conditions classified elsewhere and of unspecified site   . Migraines   . Morbid obesity ([place])   . Night sweats   . Nonspecific abnormal results of thyroid function study   . Other B-complex deficiencies   . Paresthesias   . PONV (postoperative nausea and vomiting)   . Pure hypercholesterolemia   . Reflux   . Sacral fracture ([place])   . Sinus problem   . Symptomatic states associated with artificial menopause   . Thyroid disease   . Type II or unspecified type diabetes mellitus without mention of complication, not stated as uncontrolled   . Unspecified sleep apnea      Social History   Socioeconomic History  . Marital status: Married    Spouse name: Not on file  . Number of children: 3  . Years of  education: college  . Highest education level: Not on file  Occupational History  . Occupation: Data processing manager: [employer]  . Financial resource strain: Not on file  . Food insecurity:    Worry: Not on file    Inability: Not on file  . Transportation needs:    Medical: Not on file    Non-medical: Not on file  Tobacco Use  . Smoking status: Never Smoker  . Smokeless tobacco: Never Used  Substance and Sexual Activity  . Alcohol use: Yes    Alcohol/week: 0.0  standard drinks    Comment: rarely - once per week at most-nothing for the past year 01/31/18  . Drug use: No  . Sexual activity: Never    Birth control/protection: None  Lifestyle  . Physical activity:    Days per week: Not on file    Minutes per session: Not on file  . Stress: Not on file  Relationships  . Social connections:    Talks on phone: Not on file    Gets together: Not on file    Attends religious service: Not on file    Active member of club or organization: Not on file    Attends meetings of clubs or organizations: Not on file    Relationship status: Not on file  . Intimate partner violence:    Fear of current or ex partner: Not on file    Emotionally abused: Not on file    Physically abused: Not on file    Forced sexual activity: Not on file  Other Topics Concern  . Not on file  Social History Narrative   Exercise: walking two days per week, 30 minutes.       Married x 29 years, happily married; no abuse.       Children: 3 children; 1 grandchild; two step grandchildren; 1 gg.      Lives: with husband, mother-in-law.      Employment:  [employer] x 1996; Product manager.  [employer] work.      Tobacco: never      Alcohol: socially.  One glass of wine per month.      Exercise:  Walking around campus.        Seatbelt: 100%      Guns: gun safe in garage.    Past Surgical History:  Procedure Laterality Date  . abcess removal  1997   MRSA  . ABDOMINAL HYSTERECTOMY  1997   complete in 1997, partial was in 1995  . carpal tunnel release r  07/03/13   [facility]  . [place]  . CHOLECYSTECTOMY  03/1989  . COLONOSCOPY  03/21/2011   Normal.  [facility].  [name] KNEE SURGERY  2006  . LAPAROSCOPIC GASTRIC BANDING  05/2009  . TONSILLECTOMY AND ADENOIDECTOMY  1974  . TUBAL LIGATION    . ULNAR TUNNEL RELEASE Right 02/05/2018   Procedure: CUBITAL TUNNEL RELEASE/DECOMPRESSION;  Surgeon: Melrose Nakayama, MD;  Location: [facility];  Service: Orthopedics;  Laterality: Right;     Family History  Problem Relation Age of Onset  . Diabetes Mother   . Heart disease Mother 65       CHF, CAD  . Other Mother        muscle disease  . Hypertension Mother   . Hyperlipidemia Mother   . Arthritis Mother        OA hip L s/p THR  . Diabetes Father   . Heart  disease Father        AMI/CABG/valve replacement age 46.  . Stroke Father   . Diabetes Brother   . Heart disease Brother        stent age 71.  . Fibromyalgia Daughter   . Crohn's disease Daughter   . Hypertension Son   . Alzheimer's disease Maternal Grandmother   . Emphysema Maternal Grandfather   . Heart disease Paternal Grandmother   . Breast cancer Unknown        2 Aunts    Allergies  Allergen Reactions  . Amoxicillin Shortness Of Breath and Rash  . Bee Venom Anaphylaxis  . Levofloxacin Shortness Of Breath and Rash  . Meloxicam Hives and Rash  . Vancomycin Anaphylaxis  . Sulfa Antibiotics Rash  . Ace Inhibitors Cough  . Codeine   . Meperidine Hcl   . Oxycodone-Acetaminophen   . Penicillins Swelling and Rash    Has patient had a PCN reaction causing immediate rash, facial/tongue/throat swelling, SOB or lightheadedness with hypotension: Yes Has patient had a PCN reaction causing severe rash involving mucus membranes or skin necrosis: Yes Has patient had a PCN reaction that required hospitalization: No Has patient had a PCN reaction occurring within the last 10 years: No If all of the above answers are "NO", then may proceed with Cephalosporin use.   Flo Shanks [Gatifloxacin] Rash    Current Outpatient Medications on File Prior to Visit  Medication Sig Dispense Refill  . acetaminophen (TYLENOL) 650 MG CR tablet Take 650 mg by mouth at bedtime.    [name] albuterol (PROVENTIL HFA;VENTOLIN HFA) 108 (90 Base) MCG/ACT inhaler Inhale 1-2 puffs into the lungs every 6 (six) hours as needed for wheezing or shortness of breath. 1 Inhaler 0  . atorvastatin (LIPITOR) 40 MG tablet TAKE 1 TABLET BY MOUTH DAILY  (Patient taking differently: Take 40 mg by mouth at bedtime. ) 90 tablet 3  . Blood Glucose Monitoring Suppl (ONE TOUCH ULTRA MINI) w/Device KIT Use as instructed to test blood sugar 5 times daily. 1 each 0  . Continuous Blood Gluc Sensor (FREESTYLE LIBRE 14 DAY SENSOR) MISC 1 each by Does not apply route every 14 (fourteen) days. 2 each 0  . cyclobenzaprine (FLEXERIL) 10 MG tablet Take 1 tablet (10 mg total) by mouth 3 (three) times daily as needed for muscle spasms. 270 tablet 1  . FLUoxetine (PROZAC) 40 MG capsule Take 1 capsule (40 mg total) by mouth daily. 90 capsule 3  . fluticasone (FLONASE) 50 MCG/ACT nasal spray Place 2 sprays into both nostrils daily. 16 g 11  . glucose blood test strip ONE TOUCH ULTRA MINI Use as instructed to test blood sugar 5 times daily. 200 each 5  . hydrochlorothiazide (HYDRODIURIL) 25 MG tablet TAKE 1/2 TABLET  (12.5 mg) BY MOUTH EVERY DAY. 45 tablet 1  . HYDROcodone-acetaminophen (NORCO/VICODIN) 5-325 MG tablet Take 1-2 tablets by mouth every 6 (six) hours as needed for moderate pain. 20 tablet 0  . insulin aspart (NOVOLOG FLEXPEN) 100 UNIT/ML FlexPen Inject 18 Units into the skin 3 (three) times daily with meals. 15 mL 2  . Insulin Pen Needle (UNIFINE PENTIPS) 32G X 4 MM MISC Use as directed for insulin injection daily. 100 each 11  . ketoconazole (NIZORAL) 2 % cream Apply topically once daily as needed for irritation. 60 g 0  . Lancets (ONETOUCH ULTRASOFT) lancets Use as instructed to test blood sugar 3 times daily 100 each 5  . LEVEMIR FLEXTOUCH 100 UNIT/ML Pen INJECT  38 UNITS EVERY MORNING AND 38 UNITS IN THE EVENING 45 mL 0  . levocetirizine (XYZAL) 5 MG tablet TAKE ONE TABLET BY MOUTH EVERY EVENING (Patient taking differently: TAKE ONE TABLET (5 mg)  BY MOUTH EVERY EVENING) 90 tablet 1  . losartan (COZAAR) 100 MG tablet TAKE 1/2 TABLET BY MOUTH EVERY DAY. (Patient taking differently: TAKE 1/2 TABLET (50 mg) BY MOUTH EVERY DAY.) 90 tablet 1  . Multiple Vitamin  (MULTIVITAMIN PO) Take 1 tablet by mouth daily.     . ondansetron (ZOFRAN-ODT) 4 MG disintegrating tablet TAKE 1 TABLET BY MOUTH EVERY 8 HOURS AS NEEDED FOR NAUSEA AS DIRECTED. (Patient taking differently: Take 4 mg by mouth every 8 (eight) hours as needed for nausea or vomiting. TAKE 1 TABLET BY MOUTH EVERY 8 HOURS AS NEEDED FOR NAUSEA AS DIRECTED.) 20 tablet 0  . VALERIAN ROOT PO Take 1,000 mg by mouth at bedtime.     No current facility-administered medications on file prior to visit.     BP 130/80   Pulse 77   Temp 98.2 F (36.8 C) (Oral)   SpO2 99%    Objective:   Physical Exam  Constitutional: She appears well-nourished.  Neck: Neck supple.  Cardiovascular: Normal rate and regular rhythm.  Respiratory: Effort normal and breath sounds normal.  Skin: Skin is warm and dry.  Psychiatric: She has a normal mood and affect.           Assessment & Plan:

## 2018-02-27 NOTE — Assessment & Plan Note (Signed)
Increase in A1C from 7.9 to 10.3 from labs drawn in November 2019.  We had a very frank discussion about the absolute need to check glucose readings at least once daily, preferably twice daily. We discussed the dangers of not checking glucose while taking insulin. She assured [place] today that she will start checking, we provided her with instructions and times to check.  Continue Levemir 38 units BID. Change glipizide-metformin to glipizide ER 10 mg daily. Increase Trulicity to 1.5 mg weekly.   Managed on statin and ARB. Strongly advised she schedule a diabetic eye exam. Foot exam today. Pneumonia vaccination UTD.  Follow up in 2 months for repeat A1C and follow up. Discussed for her to bring glucose logs.

## 2018-02-27 NOTE — Patient Instructions (Addendum)
Start glipizide ER 10 mg once daily for diabetes.  We've increased the dose of your Trulicity to 1.5 mg. Inject once weekly into the skin.  Continue Levemir 38 units twice daily.  You must start checking your blood sugar at least once daily, preferably twice. Rotate times of checks. Before any meal 2 hours after any meal Bedtime  Call [facility] and notify them that I put in a referral.  Schedule a follow up visit for diabetes anytime after February 14th.   It was a pleasure to see you today!   Diabetes Mellitus and Nutrition When you have diabetes (diabetes mellitus), it is very important to have healthy eating habits because your blood sugar (glucose) levels are greatly affected by what you eat and drink. Eating healthy foods in the appropriate amounts, at about the same times every day, can help you:  Control your blood glucose.  Lower your risk of heart disease.  Improve your blood pressure.  Reach or maintain a healthy weight.  Every person with diabetes is different, and each person has different needs for a meal plan. Your health care provider may recommend that you work with a diet and nutrition specialist (dietitian) to make a meal plan that is best for you. Your meal plan may vary depending on factors such as:  The calories you need.  The medicines you take.  Your weight.  Your blood glucose, blood pressure, and cholesterol levels.  Your activity level.  Other health conditions you have, such as heart or kidney disease.  How do carbohydrates affect me? Carbohydrates affect your blood glucose level more than any other type of food. Eating carbohydrates naturally increases the amount of glucose in your blood. Carbohydrate counting is a method for keeping track of how many carbohydrates you eat. Counting carbohydrates is important to keep your blood glucose at a healthy level, especially if you use insulin or take certain oral diabetes medicines. It  is important to know how many carbohydrates you can safely have in each meal. This is different for every person. Your dietitian can help you calculate how many carbohydrates you should have at each meal and for snack. Foods that contain carbohydrates include:  Bread, cereal, rice, pasta, and crackers.  Potatoes and corn.  Peas, beans, and lentils.  Milk and yogurt.  Fruit and juice.  Desserts, such as cakes, cookies, ice cream, and candy.  How does alcohol affect me? Alcohol can cause a sudden decrease in blood glucose (hypoglycemia), especially if you use insulin or take certain oral diabetes medicines. Hypoglycemia can be a life-threatening condition. Symptoms of hypoglycemia (sleepiness, dizziness, and confusion) are similar to symptoms of having too much alcohol. If your health care provider says that alcohol is safe for you, follow these guidelines:  Limit alcohol intake to no more than 1 drink per day for nonpregnant women and 2 drinks per day for men. One drink equals 12 oz of beer, 5 oz of wine, or 1 oz of hard liquor.  Do not drink on an empty stomach.  Keep yourself hydrated with water, diet soda, or unsweetened iced tea.  Keep in mind that regular soda, juice, and other mixers may contain a lot of sugar and must be counted as carbohydrates.  What are tips for following this plan? Reading food labels  Start by checking the serving size on the label. The amount of calories, carbohydrates, fats, and other nutrients listed on the label are based on one serving of the food. Many  foods contain more than one serving per package.  Check the total grams (g) of carbohydrates in one serving. You can calculate the number of servings of carbohydrates in one serving by dividing the total carbohydrates by 15. For example, if a food has 30 g of total carbohydrates, it would be equal to 2 servings of carbohydrates.  Check the number of grams (g) of saturated and trans fats in one  serving. Choose foods that have low or no amount of these fats.  Check the number of milligrams (mg) of sodium in one serving. Most people should limit total sodium intake to less than 2,300 mg per day.  Always check the nutrition information of foods labeled as "low-fat" or "nonfat". These foods may be higher in added sugar or refined carbohydrates and should be avoided.  Talk to your dietitian to identify your daily goals for nutrients listed on the label. Shopping  Avoid buying canned, premade, or processed foods. These foods tend to be high in fat, sodium, and added sugar.  Shop around the outside edge of the grocery store. This includes fresh fruits and vegetables, bulk grains, fresh meats, and fresh dairy. Cooking  Use low-heat cooking methods, such as baking, instead of high-heat cooking methods like deep frying.  Cook using healthy oils, such as olive, canola, or sunflower oil.  Avoid cooking with butter, cream, or high-fat meats. Meal planning  Eat meals and snacks regularly, preferably at the same times every day. Avoid going long periods of time without eating.  Eat foods high in fiber, such as fresh fruits, vegetables, beans, and whole grains. Talk to your dietitian about how many servings of carbohydrates you can eat at each meal.  Eat 4-6 ounces of lean protein each day, such as lean meat, chicken, fish, eggs, or tofu. 1 ounce is equal to 1 ounce of meat, chicken, or fish, 1 egg, or 1/4 cup of tofu.  Eat some foods each day that contain healthy fats, such as avocado, nuts, seeds, and fish. Lifestyle   Check your blood glucose regularly.  Exercise at least 30 minutes 5 or more days each week, or as told by your health care provider.  Take medicines as told by your health care provider.  Do not use any products that contain nicotine or tobacco, such as cigarettes and e-cigarettes. If you need help quitting, ask your health care provider.  Work with a Social worker or  diabetes educator to identify strategies to manage stress and any emotional and social challenges. What are some questions to ask my health care provider?  Do I need to meet with a diabetes educator?  Do I need to meet with a dietitian?  What number can I call if I have questions?  When are the best times to check my blood glucose? Where to find more information:  American Diabetes Association: diabetes.org/food-and-fitness/food  Academy of Nutrition and Dietetics: PokerClues.dk  [employer] of Diabetes and Digestive and Kidney Diseases (NIH): ContactWire.be Summary  A healthy meal plan will help you control your blood glucose and maintain a healthy lifestyle.  Working with a diet and nutrition specialist (dietitian) can help you make a meal plan that is best for you.  Keep in mind that carbohydrates and alcohol have immediate effects on your blood glucose levels. It is important to count carbohydrates and to use alcohol carefully. This information is not intended to replace advice given to you by your health care provider. Make sure you discuss any questions you have with  your health care provider. Document Released: 12/01/2004 Document Revised: 04/10/2016 Document Reviewed: 04/10/2016 Elsevier Interactive Patient Education  [employer].

## 2018-02-27 NOTE — Assessment & Plan Note (Signed)
Seems to be improving and has made progress with home PT. Referral placed for outpatient PT.

## 2018-03-07 ENCOUNTER — Other Ambulatory Visit: Payer: Self-pay

## 2018-03-07 ENCOUNTER — Ambulatory Visit: Payer: BC Managed Care – PPO | Attending: Primary Care | Admitting: Physical Therapy

## 2018-03-07 ENCOUNTER — Encounter: Payer: Self-pay | Admitting: Physical Therapy

## 2018-03-07 DIAGNOSIS — M25551 Pain in right hip: Secondary | ICD-10-CM | POA: Insufficient documentation

## 2018-03-07 DIAGNOSIS — M6281 Muscle weakness (generalized): Secondary | ICD-10-CM

## 2018-03-07 DIAGNOSIS — R29898 Other symptoms and signs involving the musculoskeletal system: Secondary | ICD-10-CM | POA: Diagnosis present

## 2018-03-07 DIAGNOSIS — M25571 Pain in right ankle and joints of right foot: Secondary | ICD-10-CM | POA: Insufficient documentation

## 2018-03-07 DIAGNOSIS — R262 Difficulty in walking, not elsewhere classified: Secondary | ICD-10-CM | POA: Diagnosis present

## 2018-03-07 NOTE — Therapy (Signed)
[place] MAIN [facility] SERVICES [address] [place], [place], [address] Phone: [phone]   Fax:  [phone]  Physical Therapy Evaluation  Patient Details  Name: [name] MRN: [id number] Date of Birth: 08-Jul-1965 Referring Provider (PT): Pleas Koch   Encounter Date: 03/07/2018  PT End of Session - 03/07/18 1112    Visit Number  1    Number of Visits  17    Date for PT Re-Evaluation  05/02/18    PT Start Time  1100    PT Stop Time  1145    PT Time Calculation (min)  45 min    Equipment Utilized During Treatment  Gait belt    Activity Tolerance  Patient tolerated treatment well;Patient limited by fatigue    Behavior During Therapy  WFL for tasks assessed/performed       Past Medical History:  Diagnosis Date  . Allergy   . Anemia   . Anxiety   . Arthritis   . Asthma   . Colon polyp   . Complication of anesthesia   . Diabetes mellitus   . Edema   . Fatigue   . Hypertension   . IBS (irritable bowel syndrome)   . Lump in female breast   . Methicillin resistant Staphylococcus aureus in conditions classified elsewhere and of unspecified site   . Migraines   . Morbid obesity ([place])   . Night sweats   . Nonspecific abnormal results of thyroid function study   . Other B-complex deficiencies   . Paresthesias   . PONV (postoperative nausea and vomiting)   . Pure hypercholesterolemia   . Reflux   . Sacral fracture ([place])   . Sinus problem   . Symptomatic states associated with artificial menopause   . Thyroid disease   . Type II or unspecified type diabetes mellitus without mention of complication, not stated as uncontrolled   . Unspecified sleep apnea     Past Surgical History:  Procedure Laterality Date  . abcess removal  1997   MRSA  . ABDOMINAL HYSTERECTOMY  1997   complete in 1997, partial was in 1995  . carpal tunnel release r  07/03/13   [place]  . [place]  . CHOLECYSTECTOMY   03/1989  . COLONOSCOPY  03/21/2011   Normal.  [facility].  [name] KNEE SURGERY  2006  . LAPAROSCOPIC GASTRIC BANDING  05/2009  . TONSILLECTOMY AND ADENOIDECTOMY  1974  . TUBAL LIGATION    . ULNAR TUNNEL RELEASE Right 02/05/2018   Procedure: CUBITAL TUNNEL RELEASE/DECOMPRESSION;  Surgeon: Melrose Nakayama, MD;  Location: [facility];  Service: Orthopedics;  Laterality: Right;    There were no vitals filed for this visit.   Subjective Assessment - 03/07/18 1059    Subjective  Patient is here today with no pain. She is reporting that her legs are not working correctly.     Pertinent History  Patient fx her sacrum 9/18 and  she lost sensation to right foot, and she lost ability to DF her right foot. Patient had back surgery 03/2017 , she was sent home. She was walking with no device indoors and she could get out of the house and do the steps. June 18, 2017  her LLE was getting weaker and the right leg was getting stronger. Patient had follow up with neurosurgery. She was scheduled for a steriod shot Jul 23, 2017, but on May 3 her legs didnt  feel right, she fell  4 times, she fractured her R fibula  on may 3rd, she went to hospital and spent one week. She went to SNF for 1 month. She had another neuro consult, resulting in kDx of  polyneuropathy. Then she had an apt with and [facility] said it was not polyneuropathy, (in [place])  She has a second consultation scheduled Dec 30 for a  Neuromuscular.consult.  She had a scan of the spine T 12-28-17  who then sent her to a thoracic surgeon at the end of November and was recommended to not due surgery. She does not know what her Dx is and she has been having home PT beginning August 25, 2017 . She has had HHPT 2 x  week for the last 5 months. She is able to walk 50 feet with RW. She is able to transfer from wc to stand independently.     How long can you stand comfortably?  5 mins    Patient Stated Goals  to walk without the RW, or walk better, use the bathroom    Currently in Pain?   No/denies       PAIN: Intermittent R knee , left knee gets locked up intermittently   POSTURE: WFL sitting   PROM/AROM: BUE WFL, WFL BLE  STRENGTH:  Graded on a 0-5 scale Muscle Group Left Right                          Hip Flex 1/5 -3/5  Hip Abd -3/5 1/5  Hip Add 0/5 1/5  Hip Ext -3/5 1/5  Hip IR/ER 2/5 0/5  Knee Flex 3/5 0/5  Knee Ext 3/5 4/5  Ankle DF -3/5 0/5  Ankle PF 3/5 0/5   SENSATION: RLE decreased sensation to right foot light touch/deep touch  LLE sensation is WNL  FUNCTIONAL MOBILITY: MI transfer sit to stand with 2 rails to push up with MI supine to sidelying with rails Supine to sit with rails independent Sit to supine with min assist for LLE up to the mat table Transfering from mat table: 19 inches without rails from mat level with struggle and extra time,  and assist to hold down the RW to stabilize  BALANCE: needs walker support for standing  Static Standing Balance  Normal Able to maintain standing balance against maximal resistance   Good Able to maintain standing balance against moderate resistance   Good-/Fair+ Able to maintain standing balance against minimal resistance   Fair Able to stand unsupported without UE support and without LOB for 1-2 min   Fair- Requires Min A and UE support to maintain standing without loss of balance   Poor+ Requires mod A and UE support to maintain standing without loss of balance x  Poor Requires max A and UE support to maintain standing balance without loss     Standing Dynamic Balance  Normal Stand independently unsupported, able to weight shift and cross midline maximally   Good Stand independently unsupported, able to weight shift and cross midline moderately   Good-/Fair+ Stand independently unsupported, able to weight shift across midline minimally   Fair Stand independently unsupported, weight shift, and reach ipsilaterally, loss of balance when crossing midline   Poor+ Able to stand with Min A and  reach ipsilaterally, unable to weight shift   Poor Able to stand with Mod A and minimally reach ipsilaterally, unable to cross midline. x      GAIT: Ambulates with RW 30 feet with MI with  wc following close behind, with R AFO, poor stepping quality and unsteady  OUTCOME MEASURES: TEST Outcome Interpretation      10 meter walk test      .23           m/s <1.0 m/s indicates increased risk for falls; limited community ambulator                     [facility] PT Assessment - 03/07/18 1120      Assessment   Medical Diagnosis  BLe weakness    Referring Provider (PT)  Pleas Koch    Hand Dominance  Right    Next MD Visit  03/18/18    Prior Therapy  HHPT 5 months      Precautions   Precautions  None    Required Braces or Orthoses  Other Brace/Splint    Other Brace/Splint  --   R AFO     Restrictions   Weight Bearing Restrictions  No      Balance Screen   Has the patient fallen in the past 6 months  No    How many times?  0    Has the patient had a decrease in activity level because of a fear of falling?   Yes    Is the patient reluctant to leave their home because of a fear of falling?   No      Home Film/video editor residence    Living Arrangements  Spouse/significant other    Available Help at [place] entrance    [place]  One level    [place] - 2 wheels;Bedside commode;Shower seat;Wheelchair - manual      Prior Function   Level of Independence  Needs assistance with ADLs;Needs assistance with homemaking;Needs assistance with transfers    Vocation  Retired    Leisure  plays with grandchildren      Cognition   Overall Cognitive Status  Within Functional Limits for tasks assessed                Objective measurements completed on examination: See above findings.              PT Education - 03/07/18 1112    Education provided  Yes    Education Details  plan of care     Person(s) Educated  Patient;Spouse    Methods  Explanation    Comprehension  Verbalized understanding;Returned demonstration       PT Short Term Goals - 03/07/18 1345      PT SHORT TERM GOAL #1   Title  Patient will be independent in home exercise program to improve strength/mobility for better functional independence with ADLs.    Time  6    Period  Weeks    Status  New    Target Date  04/18/18        PT Long Term Goals - 03/07/18 1223      PT LONG TERM GOAL #1   Title  Patient will increase 10 meter walk test to >.50 m/s as to improve gait speed for better community ambulation and to reduce fall risk.    Time  8    Period  Weeks    Status  New    Target Date  05/02/18      PT LONG TERM GOAL #2   Title  Patient will transfer sit to  stand  from 19 inch mat without assist or anyone supporting AD  to demonstrate improved LE strength to decrease falls risk.     Time  12    Period  Weeks    Status  New    Target Date  05/02/18      PT LONG TERM GOAL #3   Title  Patient will demonstrate stance without AD  x 1 min to demonstrate decreased falls risk.     Time  12    Period  Weeks    Status  New    Target Date  05/02/18      PT LONG TERM GOAL #4   Title  Patient will  ambulate 500 feet to work towards community ambulation distances.     Time  12    Period  Weeks    Status  New    Target Date  05/02/18             Plan - 03/07/18 1327    Clinical Impression Statement  Patient is 68 yr. old female with BLE weakness, RLE ankle AFO, needs min assist for bed mobility sit to supine, and is MI assist with transfers using elevated surface or chair with arms. Patient ambulates with R AFO and RW 30 feet with wc following behind and irratic , uneven stepping. She has poor static standing balance and poor dynamic standing balance. She lives with her husband who assists with dressing , bathing, and ADL's. Patient has decreased gait speed, increased falls risk and poor mobility  and will benefit from skilled PT to improve quality of life and improve mobility.    Rehab Potential  Fair    Clinical Impairments Affecting Rehab Potential  Multiple tests concerning for neurologic involvement     PT Frequency  2x / week    PT Duration  12 weeks    PT Treatment/Interventions  Dry needling;Balance training;Neuromuscular re-education;Gait training;DME Instruction;Aquatic Therapy;Cryotherapy;Electrical Stimulation;Ultrasound;Moist Heat;Iontophoresis 4mg /ml Dexamethasone;Stair training;Functional mobility training;Therapeutic activities;Therapeutic exercise;Orthotic Fit/Training;Patient/family education;Manual techniques;Wheelchair mobility training    PT Next Visit Plan  Progress quadricep/hamstring strengthening in [place], posterior hip musculature strengthening as tolerated.     PT Home Exercise Plan  need to do next visit as she was late to eval apt , she has HEP from [place] and Agree with Plan of Care  Patient;Family member/caregiver    Family Member Consulted  Spouse        Patient will benefit from skilled therapeutic intervention in order to improve the following deficits and impairments:  Abnormal gait, Pain, Decreased endurance, Decreased activity tolerance, Decreased strength, Obesity, Difficulty walking, Decreased balance, Decreased safety awareness, Increased edema, Impaired sensation, Decreased knowledge of use of DME  Visit Diagnosis: Muscle weakness (generalized)  Difficulty in walking, not elsewhere classified     Problem List Patient Active Problem List   Diagnosis Date Noted  . Migraine without status migrainosus, not intractable   . Chronic pain syndrome   . Lumbar disc disease with radiculopathy 07/21/2017  . Lumbar foraminal stenosis 07/21/2017  . Unable to walk 07/21/2017  . Multiple falls 07/21/2017  . Displaced fracture of proximal end of right fibula 07/21/2017  . Lower extremity weakness 05/28/2017  . Right lower quadrant abdominal  pain 08/22/2016  . Abnormal TSH 01/10/2016  . Morbid obesity ([place]) 01/12/2014  . Routine general medical examination at a health care facility 10/28/2012  . GERD (gastroesophageal reflux disease) 10/28/2012  . Anxiety 12/20/2011  . Allergic rhinitis 12/20/2011  .  Asthma 12/20/2011  . Lapband APL with HH repair 03/31/2011  . Uncontrolled type 2 diabetes mellitus with hyperglycemia, with long-term current use of insulin ([place]) 06/30/2007  . Hyperlipidemia 06/30/2007  . Essential hypertension 06/27/2007    Alanson Puls, [place] DPT 03/07/2018, 2:19 PM  [place] MAIN [facility] SERVICES [address] [place], [place], [address] Phone: [phone]   Fax:  [phone]  Name: [name] MRN: [id number] Date of Birth: Apr 10, 1965

## 2018-03-11 ENCOUNTER — Ambulatory Visit: Payer: BC Managed Care – PPO

## 2018-03-11 DIAGNOSIS — M25571 Pain in right ankle and joints of right foot: Secondary | ICD-10-CM

## 2018-03-11 DIAGNOSIS — R29898 Other symptoms and signs involving the musculoskeletal system: Secondary | ICD-10-CM

## 2018-03-11 DIAGNOSIS — M6281 Muscle weakness (generalized): Secondary | ICD-10-CM | POA: Diagnosis not present

## 2018-03-11 DIAGNOSIS — R262 Difficulty in walking, not elsewhere classified: Secondary | ICD-10-CM

## 2018-03-11 NOTE — Therapy (Signed)
[place] MAIN [facility] SERVICES [address] [place], [place], [address] Phone: [phone]   Fax:  [phone]  Physical Therapy Treatment  Patient Details  Name: [name] MRN: [id number] Date of Birth: [birth year] Referring Provider (PT): Pleas Koch   Encounter Date: 03/11/2018  PT End of Session - 03/11/18 1240    Visit Number  2    Number of Visits  17    Date for PT Re-Evaluation  05/02/18    PT Start Time  1601    PT Stop Time  1100    PT Time Calculation (min)  45 min    Equipment Utilized During Treatment  Gait belt    Activity Tolerance  Patient tolerated treatment well;Patient limited by fatigue    Behavior During Therapy  WFL for tasks assessed/performed       Past Medical History:  Diagnosis Date  . Allergy   . Anemia   . Anxiety   . Arthritis   . Asthma   . Colon polyp   . Complication of anesthesia   . Diabetes mellitus   . Edema   . Fatigue   . Hypertension   . IBS (irritable bowel syndrome)   . Lump in female breast   . Methicillin resistant Staphylococcus aureus in conditions classified elsewhere and of unspecified site   . Migraines   . Morbid obesity ([place])   . Night sweats   . Nonspecific abnormal results of thyroid function study   . Other B-complex deficiencies   . Paresthesias   . PONV (postoperative nausea and vomiting)   . Pure hypercholesterolemia   . Reflux   . Sacral fracture ([place])   . Sinus problem   . Symptomatic states associated with artificial menopause   . Thyroid disease   . Type II or unspecified type diabetes mellitus without mention of complication, not stated as uncontrolled   . Unspecified sleep apnea     Past Surgical History:  Procedure Laterality Date  . abcess removal  1997   MRSA  . ABDOMINAL HYSTERECTOMY  1997   complete in 1997, partial was in 1995  . carpal tunnel release r  07/03/13   Dal[place]orf  . [place]  . CHOLECYSTECTOMY   03/1989  . COLONOSCOPY  03/21/2011   Normal.  [facility].  [name] KNEE SURGERY  2006  . LAPAROSCOPIC GASTRIC BANDING  05/2009  . TONSILLECTOMY AND ADENOIDECTOMY  1974  . TUBAL LIGATION    . ULNAR TUNNEL RELEASE Right 02/05/2018   Procedure: CUBITAL TUNNEL RELEASE/DECOMPRESSION;  Surgeon: Melrose Nakayama, MD;  Location: [place];  Service: Orthopedics;  Laterality: Right;    There were no vitals filed for this visit.  Subjective Assessment - 03/11/18 1110    Subjective  Patient presents without pain today. Reports she would like to be able to get on/off of bathroom commode with single rail support.      Pertinent History   Patient fx her sacrum 9/18 and  she lost sensation to right foot, and she lost ability to DF her right foot. Patient had back surgery 03/2017 , she was sent home. She was walking with no device indoors and she could get out of the house and do the steps. June 18, 2017  her LLE was getting weaker and the right leg was getting stronger. Patient had follow up with neurosurgery. She was scheduled for a steriod shot Jul 23, 2017, but on May 3 her legs  didnt  feel right, she fell 4 times, she fractured her R fibula  on may 3rd, she went to hospital and spent one week. She went to SNF for 1 month. She had another neuro consult, resulting in kDx of  polyneuropathy. Then she had an apt with and [facility] said it was not polyneuropathy, (in [place])  She has a second consultation scheduled Dec 30 for a  Neuromuscular.consult.  She had a scan of the spine T 12-28-17  who then sent her to a thoracic surgeon at the end of November and was recommended to not due surgery. She does not know what her Dx is and she has been having home PT beginning August 25, 2017 . She has had HHPT 2 x  week for the last 5 months. She is able to walk 50 feet with RW. She is able to transfer from wc to stand independently.     How long can you stand comfortably?  5 mins    Patient Stated Goals  to walk without the RW, or walk better, use  the bathroom    Currently in Pain?  No/denies       Treat:   sit to stands, left hand on table, right hand on walker, PT CGA and stabilize walker. Verbal cueing for gluteal activation for full trunk extension/upright posture. Verbal cueing for set up in regards to position prior to sit to stand, forward momentum, and hand placement; 4 sets of 3-5 reps.  Standing marches: weight shift and lift of opp LE with excessive UE support on walker: 4x each LE, 2 sets with second set performed with 5x each LE  SPT with w/c: CGA limited weight shift and LE clearance.   Balloon taps in seated: reaching inside and outside BOS for abdominal activation 2 minutes.   HEP added to current HEP:    Access Code: [email or web address]  URL: [email or web address]  Date: 03/11/2018  Prepared by: Janna Arch   Exercises  Seated Gluteal Sets - 10 reps - 2 sets - 3 hold - 1x daily - 7x weekly  Seated Hip Adduction Squeeze with Ball - 10 reps - 2 sets - 10 hold - 1x daily - 7x weekly                      PT Education - 03/11/18 1240    Education provided  Yes    Education Details  exercise technique    Person(s) Educated  Patient    Methods  Explanation;Demonstration;Tactile cues;Verbal cues    Comprehension  Verbalized understanding;Returned demonstration;Tactile cues required;Need further instruction;Verbal cues required       PT Short Term Goals - 03/07/18 1345      PT SHORT TERM GOAL #1   Title  Patient will be independent in home exercise program to improve strength/mobility for better functional independence with ADLs.    Time  6    Period  Weeks    Status  New    Target Date  04/18/18        PT Long Term Goals - 03/07/18 1223      PT LONG TERM GOAL #1   Title  Patient will increase 10 meter walk test to >.50 m/s as to improve gait speed for better community ambulation and to reduce fall risk.    Time  8    Period  Weeks    Status  New    Target Date  05/02/18       PT  LONG TERM GOAL #2   Title  Patient will transfer sit to stand  from 19 inch mat without assist or anyone supporting AD  to demonstrate improved LE strength to decrease falls risk.     Time  12    Period  Weeks    Status  New    Target Date  05/02/18      PT LONG TERM GOAL #3   Title  Patient will demonstrate stance without AD  x 1 min to demonstrate decreased falls risk.     Time  12    Period  Weeks    Status  New    Target Date  05/02/18      PT LONG TERM GOAL #4   Title  Patient will  ambulate 500 feet to work towards community ambulation distances.     Time  12    Period  Weeks    Status  New    Target Date  05/02/18            Plan - 03/11/18 1249    Clinical Impression Statement  Patient presents for first treatment session in wheelchair with walker and husband. Patient is challenged with sit to stand transfers and requires verbal cueing for sequencing of transfer to perform correctly. Transfers improved with repetition however patient fatigued quickly and required multiple seated rest breaks. Patient will benefit from skilled PT to improve quality of life and improve mobility    Rehab Potential  Fair    Clinical Impairments Affecting Rehab Potential  Multiple tests concerning for neurologic involvement     PT Frequency  2x / week    PT Duration  12 weeks    PT Treatment/Interventions  Dry needling;Balance training;Neuromuscular re-education;Gait training;DME Instruction;Aquatic Therapy;Cryotherapy;Electrical Stimulation;Ultrasound;Moist Heat;Iontophoresis 4mg /ml Dexamethasone;Stair training;Functional mobility training;Therapeutic activities;Therapeutic exercise;Orthotic Fit/Training;Patient/family education;Manual techniques;Wheelchair mobility training    PT Next Visit Plan  Progress quadricep/hamstring strengthening in [place], posterior hip musculature strengthening as tolerated.     PT Home Exercise Plan  need to do next visit as she was late to eval apt , she has  HEP from [place] and Agree with Plan of Care  Patient;Family member/caregiver    Family Member Consulted  Spouse        Patient will benefit from skilled therapeutic intervention in order to improve the following deficits and impairments:  Abnormal gait, Pain, Decreased endurance, Decreased activity tolerance, Decreased strength, Obesity, Difficulty walking, Decreased balance, Decreased safety awareness, Increased edema, Impaired sensation, Decreased knowledge of use of DME  Visit Diagnosis: Muscle weakness (generalized)  Difficulty in walking, not elsewhere classified  Pain in right ankle and joints of right foot  Weakness of right leg     Problem List Patient Active Problem List   Diagnosis Date Noted  . Migraine without status migrainosus, not intractable   . Chronic pain syndrome   . Lumbar disc disease with radiculopathy 07/21/2017  . Lumbar foraminal stenosis 07/21/2017  . Unable to walk 07/21/2017  . Multiple falls 07/21/2017  . Displaced fracture of proximal end of right fibula 07/21/2017  . Lower extremity weakness 05/28/2017  . Right lower quadrant abdominal pain 08/22/2016  . Abnormal TSH 01/10/2016  . Morbid obesity ([place]) 01/12/2014  . Routine general medical examination at a health care facility 10/28/2012  . GERD (gastroesophageal reflux disease) 10/28/2012  . Anxiety 12/20/2011  . Allergic rhinitis 12/20/2011  . Asthma 12/20/2011  . Lapband APL with HH repair 03/31/2011  . Uncontrolled type 2  diabetes mellitus with hyperglycemia, with long-term current use of insulin ([place]) 06/30/2007  . Hyperlipidemia 06/30/2007  . Essential hypertension 06/27/2007    Janna Arch, PT, DPT   03/11/2018, 12:50 PM  [place] MAIN [facility] SERVICES [address] [place], [place], [address] Phone: [phone]   Fax:  [phone]  Name: [name] MRN: [id number] Date of Birth: 1966-01-30

## 2018-03-14 ENCOUNTER — Other Ambulatory Visit: Payer: Self-pay | Admitting: Primary Care

## 2018-03-14 ENCOUNTER — Ambulatory Visit: Payer: BC Managed Care – PPO

## 2018-03-14 DIAGNOSIS — Z794 Long term (current) use of insulin: Principal | ICD-10-CM

## 2018-03-14 DIAGNOSIS — E119 Type 2 diabetes mellitus without complications: Secondary | ICD-10-CM

## 2018-03-14 DIAGNOSIS — R262 Difficulty in walking, not elsewhere classified: Secondary | ICD-10-CM

## 2018-03-14 DIAGNOSIS — M6281 Muscle weakness (generalized): Secondary | ICD-10-CM | POA: Diagnosis not present

## 2018-03-14 NOTE — Therapy (Signed)
[place] MAIN [facility] SERVICES [address] [place], [place], [address] Phone: [phone]   Fax:  [phone]  Physical Therapy Treatment  Patient Details  Name: [name] MRN: [id number] Date of Birth: [birth year] Referring Provider (PT): Pleas Koch   Encounter Date: 03/14/2018  PT End of Session - 03/14/18 1448    Visit Number  3    Number of Visits  17    Date for PT Re-Evaluation  05/02/18    PT Start Time  1300    PT Stop Time  1345    PT Time Calculation (min)  45 min    Equipment Utilized During Treatment  Gait belt    Activity Tolerance  Patient tolerated treatment well;Patient limited by fatigue    Behavior During Therapy  WFL for tasks assessed/performed       Past Medical History:  Diagnosis Date  . Allergy   . Anemia   . Anxiety   . Arthritis   . Asthma   . Colon polyp   . Complication of anesthesia   . Diabetes mellitus   . Edema   . Fatigue   . Hypertension   . IBS (irritable bowel syndrome)   . Lump in female breast   . Methicillin resistant Staphylococcus aureus in conditions classified elsewhere and of unspecified site   . Migraines   . Morbid obesity ([place])   . Night sweats   . Nonspecific abnormal results of thyroid function study   . Other B-complex deficiencies   . Paresthesias   . PONV (postoperative nausea and vomiting)   . Pure hypercholesterolemia   . Reflux   . Sacral fracture ([place])   . Sinus problem   . Symptomatic states associated with artificial menopause   . Thyroid disease   . Type II or unspecified type diabetes mellitus without mention of complication, not stated as uncontrolled   . Unspecified sleep apnea     Past Surgical History:  Procedure Laterality Date  . abcess removal  1997   MRSA  . ABDOMINAL HYSTERECTOMY  1997   complete in 1997, partial was in 1995  . carpal tunnel release r  07/03/13   [place]orf  . [place]  . CHOLECYSTECTOMY   03/1989  . COLONOSCOPY  03/21/2011   Normal.  [facility].  [name] KNEE SURGERY  2006  . LAPAROSCOPIC GASTRIC BANDING  05/2009  . TONSILLECTOMY AND ADENOIDECTOMY  1974  . TUBAL LIGATION    . ULNAR TUNNEL RELEASE Right 02/05/2018   Procedure: CUBITAL TUNNEL RELEASE/DECOMPRESSION;  Surgeon: Melrose Nakayama, MD;  Location: [place];  Service: Orthopedics;  Laterality: Right;    There were no vitals filed for this visit.  Subjective Assessment - 03/14/18 1303    Subjective  Patient reports she hasn't had to use the lift chair to get up. Reports legs are a little sore.     Pertinent History   Patient fx her sacrum 9/18 and  she lost sensation to right foot, and she lost ability to DF her right foot. Patient had back surgery 03/2017 , she was sent home. She was walking with no device indoors and she could get out of the house and do the steps. June 18, 2017  her LLE was getting weaker and the right leg was getting stronger. Patient had follow up with neurosurgery. She was scheduled for a steriod shot Jul 23, 2017, but on May 3 her legs didnt  feel right,  she fell 4 times, she fractured her R fibula  on may 3rd, she went to hospital and spent one week. She went to SNF for 1 month. She had another neuro consult, resulting in kDx of  polyneuropathy. Then she had an apt with and [facility] said it was not polyneuropathy, (in [place])  She has a second consultation scheduled Dec 30 for a  Neuromuscular.consult.  She had a scan of the spine T 12-28-17  who then sent her to a thoracic surgeon at the end of November and was recommended to not due surgery. She does not know what her Dx is and she has been having home PT beginning August 25, 2017 . She has had HHPT 2 x  week for the last 5 months. She is able to walk 50 feet with RW. She is able to transfer from wc to stand independently.     How long can you stand comfortably?  5 mins    Patient Stated Goals  to walk without the RW, or walk better, use the bathroom    Currently in Pain?   No/denies       sit to stands, left hand on table, right hand on walker, PT CGA and stabilize walker. Verbal cueing for gluteal activation for full trunk extension/upright posture. Verbal cueing for set up in regards to position prior to sit to stand, forward momentum, and hand placement; 2 sets of 3-5 reps.  Standing with RW: marches 5-6x each LE, 2 sets;   Sit to stand with use of // bars to replicate bathroom mobility: patient unable to perform : railing on R side walker in front,patient unable to pull with R hand due to injury/weakness limiting her ability to transfer  // Bars: pivot turns x 4: turn to L side  // bars: ambulate forward and back  with CGA; challenging to patient with noted exhaustion/fatigue by end of // bars  Seated: RTB : hamstring curl 10x RTB: adduction one leg at a time 10x RTB single leg push down 10x each LE.  TrA contraction: 10x 5 second                         PT Education - 03/14/18 1445    Education provided  Yes    Education Details  exercise technique, ambulation, transfers    Person(s) Educated  Patient    Methods  Explanation;Demonstration;Tactile cues;Verbal cues    Comprehension  Verbalized understanding;Returned demonstration;Tactile cues required;Need further instruction;Verbal cues required       PT Short Term Goals - 03/07/18 1345      PT SHORT TERM GOAL #1   Title  Patient will be independent in home exercise program to improve strength/mobility for better functional independence with ADLs.    Time  6    Period  Weeks    Status  New    Target Date  04/18/18        PT Long Term Goals - 03/07/18 1223      PT LONG TERM GOAL #1   Title  Patient will increase 10 meter walk test to >.50 m/s as to improve gait speed for better community ambulation and to reduce fall risk.    Time  8    Period  Weeks    Status  New    Target Date  05/02/18      PT LONG TERM GOAL #2   Title  Patient will transfer sit to stand  from 19 inch mat without assist or anyone supporting AD  to demonstrate improved LE strength to decrease falls risk.     Time  12    Period  Weeks    Status  New    Target Date  05/02/18      PT LONG TERM GOAL #3   Title  Patient will demonstrate stance without AD  x 1 min to demonstrate decreased falls risk.     Time  12    Period  Weeks    Status  New    Target Date  05/02/18      PT LONG TERM GOAL #4   Title  Patient will  ambulate 500 feet to work towards community ambulation distances.     Time  12    Period  Weeks    Status  New    Target Date  05/02/18            Plan - 03/14/18 1449    Clinical Impression Statement  Patient demonstrated carryover from previous session with improved sit to stand transition. Patient is unable to transition from sit to stand in restrooms due to limited strength/injury in R hand, this will be a focus in future sessions. Patient's LE weakness requires her to utilize her LUE for transitions and ambulation. Patient demonstrated improved tolerance for standing as well as increased stability with // bar mobility. Patient will benefit from skilled PT to improve quality of life and improve mobility    Rehab Potential  Fair    Clinical Impairments Affecting Rehab Potential  Multiple tests concerning for neurologic involvement     PT Frequency  2x / week    PT Duration  12 weeks    PT Treatment/Interventions  Dry needling;Balance training;Neuromuscular re-education;Gait training;DME Instruction;Aquatic Therapy;Cryotherapy;Electrical Stimulation;Ultrasound;Moist Heat;Iontophoresis 4mg /ml Dexamethasone;Stair training;Functional mobility training;Therapeutic activities;Therapeutic exercise;Orthotic Fit/Training;Patient/family education;Manual techniques;Wheelchair mobility training    PT Next Visit Plan  Progress quadricep/hamstring strengthening in [place], posterior hip musculature strengthening as tolerated.     PT Home Exercise Plan  need to do next  visit as she was late to eval apt , she has HEP from [place] and Agree with Plan of Care  Patient;Family member/caregiver    Family Member Consulted  Spouse        Patient will benefit from skilled therapeutic intervention in order to improve the following deficits and impairments:  Abnormal gait, Pain, Decreased endurance, Decreased activity tolerance, Decreased strength, Obesity, Difficulty walking, Decreased balance, Decreased safety awareness, Increased edema, Impaired sensation, Decreased knowledge of use of DME  Visit Diagnosis: Muscle weakness (generalized)  Difficulty in walking, not elsewhere classified     Problem List Patient Active Problem List   Diagnosis Date Noted  . Migraine without status migrainosus, not intractable   . Chronic pain syndrome   . Lumbar disc disease with radiculopathy 07/21/2017  . Lumbar foraminal stenosis 07/21/2017  . Unable to walk 07/21/2017  . Multiple falls 07/21/2017  . Displaced fracture of proximal end of right fibula 07/21/2017  . Lower extremity weakness 05/28/2017  . Right lower quadrant abdominal pain 08/22/2016  . Abnormal TSH 01/10/2016  . Morbid obesity ([place]) 01/12/2014  . Routine general medical examination at a health care facility 10/28/2012  . GERD (gastroesophageal reflux disease) 10/28/2012  . Anxiety 12/20/2011  . Allergic rhinitis 12/20/2011  . Asthma 12/20/2011  . Lapband APL with HH repair 03/31/2011  . Uncontrolled type 2 diabetes mellitus with hyperglycemia, with long-term current use of  insulin ([place]) 06/30/2007  . Hyperlipidemia 06/30/2007  . Essential hypertension 06/27/2007   Janna Arch, PT, DPT   03/14/2018, 2:49 PM  [place] MAIN [facility] SERVICES [address] [place], [place], [address] Phone: [phone]   Fax:  [phone]  Name: [name] MRN: [id number] Date of Birth: 1965-06-15

## 2018-03-17 ENCOUNTER — Other Ambulatory Visit: Payer: Self-pay | Admitting: Primary Care

## 2018-03-17 DIAGNOSIS — I10 Essential (primary) hypertension: Secondary | ICD-10-CM

## 2018-03-18 ENCOUNTER — Ambulatory Visit: Payer: BC Managed Care – PPO | Admitting: Physical Therapy

## 2018-03-19 ENCOUNTER — Ambulatory Visit: Payer: BC Managed Care – PPO | Admitting: Physical Therapy

## 2018-03-19 ENCOUNTER — Encounter: Payer: Self-pay | Admitting: Physical Therapy

## 2018-03-19 DIAGNOSIS — M25551 Pain in right hip: Secondary | ICD-10-CM

## 2018-03-19 DIAGNOSIS — M25571 Pain in right ankle and joints of right foot: Secondary | ICD-10-CM

## 2018-03-19 DIAGNOSIS — R262 Difficulty in walking, not elsewhere classified: Secondary | ICD-10-CM

## 2018-03-19 DIAGNOSIS — M6281 Muscle weakness (generalized): Secondary | ICD-10-CM | POA: Diagnosis not present

## 2018-03-19 DIAGNOSIS — R29898 Other symptoms and signs involving the musculoskeletal system: Secondary | ICD-10-CM

## 2018-03-19 NOTE — Therapy (Signed)
[place] MAIN [facility] SERVICES [address] [place], [place], [address] Phone: [phone]   Fax:  [phone]  Physical Therapy Treatment  Patient Details  Name: [name] MRN: [id number] Date of Birth: [birth year] Referring Provider (PT): Pleas Koch   Encounter Date: 03/19/2018  PT End of Session - 03/19/18 1448    Visit Number  4    Number of Visits  17    Date for PT Re-Evaluation  05/02/18    PT Start Time  0240    PT Stop Time  0320    PT Time Calculation (min)  40 min    Equipment Utilized During Treatment  Gait belt    Activity Tolerance  Patient tolerated treatment well;Patient limited by fatigue    Behavior During Therapy  WFL for tasks assessed/performed       Past Medical History:  Diagnosis Date  . Allergy   . Anemia   . Anxiety   . Arthritis   . Asthma   . Colon polyp   . Complication of anesthesia   . Diabetes mellitus   . Edema   . Fatigue   . Hypertension   . IBS (irritable bowel syndrome)   . Lump in female breast   . Methicillin resistant Staphylococcus aureus in conditions classified elsewhere and of unspecified site   . Migraines   . Morbid obesity ([place])   . Night sweats   . Nonspecific abnormal results of thyroid function study   . Other B-complex deficiencies   . Paresthesias   . PONV (postoperative nausea and vomiting)   . Pure hypercholesterolemia   . Reflux   . Sacral fracture ([place])   . Sinus problem   . Symptomatic states associated with artificial menopause   . Thyroid disease   . Type II or unspecified type diabetes mellitus without mention of complication, not stated as uncontrolled   . Unspecified sleep apnea     Past Surgical History:  Procedure Laterality Date  . abcess removal  1997   MRSA  . ABDOMINAL HYSTERECTOMY  1997   complete in 1997, partial was in 1995  . carpal tunnel release r  07/03/13   Dalldorf  . [place]  . CHOLECYSTECTOMY   03/1989  . COLONOSCOPY  03/21/2011   Normal.  [facility].  [name] KNEE SURGERY  2006  . LAPAROSCOPIC GASTRIC BANDING  05/2009  . TONSILLECTOMY AND ADENOIDECTOMY  1974  . TUBAL LIGATION    . ULNAR TUNNEL RELEASE Right 02/05/2018   Procedure: CUBITAL TUNNEL RELEASE/DECOMPRESSION;  Surgeon: Melrose Nakayama, MD;  Location: [place];  Service: Orthopedics;  Laterality: Right;    There were no vitals filed for this visit.  Subjective Assessment - 03/19/18 1445    Subjective  Patient reports she is having 7/10 pain in her legs today. Reports that she is doing her HEP. Her MD apt was rescheduled for the third time, and now the apt is Feb 28th.    Pertinent History   Patient fx her sacrum 9/18 and  she lost sensation to right foot, and she lost ability to DF her right foot. Patient had back surgery 03/2017 , she was sent home. She was walking with no device indoors and she could get out of the house and do the steps. June 18, 2017  her LLE was getting weaker and the right leg was getting stronger. Patient had follow up with neurosurgery. She was scheduled for a steriod  shot Jul 23, 2017, but on May 3 her legs didnt  feel right, she fell 4 times, she fractured her R fibula  on may 3rd, she went to hospital and spent one week. She went to SNF for 1 month. She had another neuro consult, resulting in kDx of  polyneuropathy. Then she had an apt with and [facility] said it was not polyneuropathy, (in [place])  She has a second consultation scheduled Dec 30 for a  Neuromuscular.consult.  She had a scan of the spine T 12-28-17  who then sent her to a thoracic surgeon at the end of November and was recommended to not due surgery. She does not know what her Dx is and she has been having home PT beginning August 25, 2017 . She has had HHPT 2 x  week for the last 5 months. She is able to walk 50 feet with RW. She is able to transfer from wc to stand independently.     How long can you stand comfortably?  5 mins    Patient Stated Goals  to  walk without the RW, or walk better, use the bathroom    Currently in Pain?  Yes    Pain Score  7     Pain Orientation  Right;Left    Pain Descriptors / Indicators  Aching    Pain Type  Chronic pain    Aggravating Factors   movement    Pain Relieving Factors  rest    Multiple Pain Sites  No        Treatment: wc <> mat transfer with CGA and assist to steady the RW initially Standing sit to stand x 5  from 21 inch mat , assist to steady the RW Standing marching x 10 BLE, holding onto RW and mat table behind, assist to steady the RW Seated LAQ with 3 sec hold x 10 BLE hooklying bridging x 10 with not a  Lot of lift off the mat table hooklying hip abd/ER x 1 min with TA , GTB  Hooklying trunk rotation left and right x 1 min sidelying hip abd with pillowcase and sliding board x 2 mins RLE x 2 reps , 40 sec LLE x 2 sets Heel slide on sliding board with assist on LLE and no assist on RLE x 20 reps x 2 sets hooklying marching with assist on LLE x 10 x 3, no assist on RLE 10 x 3 sets Hooklying LLE abd/add with knee flex short range x 10 x 3, LLE only Patient has muscle  fatigue and needs assist in LLE hip exercises . Patient needs cues for not substituting with wrong muscles during exercises for better technique                        PT Education - 03/19/18 1448    Education provided  Yes    Education Details  HEP    Person(s) Educated  Patient    Methods  Explanation    Comprehension  Verbalized understanding;Returned demonstration       PT Short Term Goals - 03/07/18 1345      PT SHORT TERM GOAL #1   Title  Patient will be independent in home exercise program to improve strength/mobility for better functional independence with ADLs.    Time  6    Period  Weeks    Status  New    Target Date  04/18/18        PT  Long Term Goals - 03/07/18 1223      PT LONG TERM GOAL #1   Title  Patient will increase 10 meter walk test to >.50 m/s as to improve gait  speed for better community ambulation and to reduce fall risk.    Time  8    Period  Weeks    Status  New    Target Date  05/02/18      PT LONG TERM GOAL #2   Title  Patient will transfer sit to stand  from 19 inch mat without assist or anyone supporting AD  to demonstrate improved LE strength to decrease falls risk.     Time  12    Period  Weeks    Status  New    Target Date  05/02/18      PT LONG TERM GOAL #3   Title  Patient will demonstrate stance without AD  x 1 min to demonstrate decreased falls risk.     Time  12    Period  Weeks    Status  New    Target Date  05/02/18      PT LONG TERM GOAL #4   Title  Patient will  ambulate 500 feet to work towards community ambulation distances.     Time  12    Period  Weeks    Status  New    Target Date  05/02/18            Plan - 03/19/18 1449    Clinical Impression Statement  [place] performs sit to stand from 21 inch mat x 5 reps with quality getting poor with increased reps and with more LE support on mat needed as the repetitions increased. She performs marching in standing x 10 reps and marcing in sitting x 10 reps followed by LAQ and open chain LE exercises. Patient is hard worker and has fatigue in LE muscles for closed chain exercises. She will continue to benefit from skilled PT to improve mobility and quality of life.     Rehab Potential  Fair    Clinical Impairments Affecting Rehab Potential  Multiple tests concerning for neurologic involvement     PT Frequency  2x / week    PT Duration  12 weeks    PT Treatment/Interventions  Dry needling;Balance training;Neuromuscular re-education;Gait training;DME Instruction;Aquatic Therapy;Cryotherapy;Electrical Stimulation;Ultrasound;Moist Heat;Iontophoresis 4mg /ml Dexamethasone;Stair training;Functional mobility training;Therapeutic activities;Therapeutic exercise;Orthotic Fit/Training;Patient/family education;Manual techniques;Wheelchair mobility training    PT Next Visit Plan   Progress quadricep/hamstring strengthening in [place], posterior hip musculature strengthening as tolerated.     PT Home Exercise Plan  need to do next visit as she was late to eval apt , she has HEP from [place] and Agree with Plan of Care  Patient;Family member/caregiver    Family Member Consulted  Spouse        Patient will benefit from skilled therapeutic intervention in order to improve the following deficits and impairments:  Abnormal gait, Pain, Decreased endurance, Decreased activity tolerance, Decreased strength, Obesity, Difficulty walking, Decreased balance, Decreased safety awareness, Increased edema, Impaired sensation, Decreased knowledge of use of DME  Visit Diagnosis: Muscle weakness (generalized)  Difficulty in walking, not elsewhere classified  Pain in right ankle and joints of right foot  Weakness of right leg  Pain in right hip     Problem List Patient Active Problem List   Diagnosis Date Noted  . Migraine without status migrainosus, not intractable   . Chronic pain syndrome   .  Lumbar disc disease with radiculopathy 07/21/2017  . Lumbar foraminal stenosis 07/21/2017  . Unable to walk 07/21/2017  . Multiple falls 07/21/2017  . Displaced fracture of proximal end of right fibula 07/21/2017  . Lower extremity weakness 05/28/2017  . Right lower quadrant abdominal pain 08/22/2016  . Abnormal TSH 01/10/2016  . Morbid obesity ([place]) 01/12/2014  . Routine general medical examination at a health care facility 10/28/2012  . GERD (gastroesophageal reflux disease) 10/28/2012  . Anxiety 12/20/2011  . Allergic rhinitis 12/20/2011  . Asthma 12/20/2011  . Lapband APL with HH repair 03/31/2011  . Uncontrolled type 2 diabetes mellitus with hyperglycemia, with long-term current use of insulin ([place]) 06/30/2007  . Hyperlipidemia 06/30/2007  . Essential hypertension 06/27/2007    Alanson Puls, [place] DPT 03/19/2018, 3:35 PM  [place] MAIN [facility] SERVICES [address] [place], [place], [address] Phone: [phone]   Fax:  [phone]  Name: SYONA WROBLEWSKI MRN: [id number] Date of Birth: 1965-10-10

## 2018-03-21 ENCOUNTER — Encounter: Payer: Self-pay | Admitting: Physical Therapy

## 2018-03-21 ENCOUNTER — Ambulatory Visit: Payer: BC Managed Care – PPO

## 2018-03-21 ENCOUNTER — Ambulatory Visit: Payer: BC Managed Care – PPO | Attending: Primary Care | Admitting: Physical Therapy

## 2018-03-21 DIAGNOSIS — M25571 Pain in right ankle and joints of right foot: Secondary | ICD-10-CM | POA: Diagnosis present

## 2018-03-21 DIAGNOSIS — R262 Difficulty in walking, not elsewhere classified: Secondary | ICD-10-CM | POA: Diagnosis present

## 2018-03-21 DIAGNOSIS — R29898 Other symptoms and signs involving the musculoskeletal system: Secondary | ICD-10-CM | POA: Insufficient documentation

## 2018-03-21 DIAGNOSIS — M6281 Muscle weakness (generalized): Secondary | ICD-10-CM | POA: Insufficient documentation

## 2018-03-21 DIAGNOSIS — M25551 Pain in right hip: Secondary | ICD-10-CM | POA: Insufficient documentation

## 2018-03-21 NOTE — Therapy (Signed)
[place] MAIN [facility] SERVICES [address] [place], [place], [address] Phone: [phone]   Fax:  [phone]  Physical Therapy Treatment  Patient Details  Name: [name] MRN: [id number] Date of Birth: 1966-01-11 Referring Provider (PT): Pleas Koch   Encounter Date: 03/21/2018  PT End of Session - 03/21/18 1425    Visit Number  5    Number of Visits  17    Date for PT Re-Evaluation  05/02/18    PT Start Time  0205    PT Stop Time  0230    PT Time Calculation (min)  25 min    Equipment Utilized During Treatment  Gait belt    Activity Tolerance  Patient tolerated treatment well;Patient limited by fatigue    Behavior During Therapy  WFL for tasks assessed/performed       Past Medical History:  Diagnosis Date  . Allergy   . Anemia   . Anxiety   . Arthritis   . Asthma   . Colon polyp   . Complication of anesthesia   . Diabetes mellitus   . Edema   . Fatigue   . Hypertension   . IBS (irritable bowel syndrome)   . Lump in female breast   . Methicillin resistant Staphylococcus aureus in conditions classified elsewhere and of unspecified site   . Migraines   . Morbid obesity ([place])   . Night sweats   . Nonspecific abnormal results of thyroid function study   . Other B-complex deficiencies   . Paresthesias   . PONV (postoperative nausea and vomiting)   . Pure hypercholesterolemia   . Reflux   . Sacral fracture ([place])   . Sinus problem   . Symptomatic states associated with artificial menopause   . Thyroid disease   . Type II or unspecified type diabetes mellitus without mention of complication, not stated as uncontrolled   . Unspecified sleep apnea     Past Surgical History:  Procedure Laterality Date  . abcess removal  1997   MRSA  . ABDOMINAL HYSTERECTOMY  1997   complete in 1997, partial was in 1995  . carpal tunnel release r  07/03/13   Dalldorf  . [place]  . CHOLECYSTECTOMY   03/1989  . COLONOSCOPY  03/21/2011   Normal.  [facility].  [name] KNEE SURGERY  2006  . LAPAROSCOPIC GASTRIC BANDING  05/2009  . TONSILLECTOMY AND ADENOIDECTOMY  1974  . TUBAL LIGATION    . ULNAR TUNNEL RELEASE Right 02/05/2018   Procedure: CUBITAL TUNNEL RELEASE/DECOMPRESSION;  Surgeon: Melrose Nakayama, MD;  Location: [place];  Service: Orthopedics;  Laterality: Right;    There were no vitals filed for this visit.  Subjective Assessment - 03/21/18 1424    Subjective  Patient reports she is having 0/10 pain in her legs today, just stiff knees. Reports that she is doing her HEP. Her MD apt was rescheduled for the third time, and now the apt is Feb 28th.    Pertinent History   Patient fx her sacrum 9/18 and  she lost sensation to right foot, and she lost ability to DF her right foot. Patient had back surgery 03/2017 , she was sent home. She was walking with no device indoors and she could get out of the house and do the steps. June 18, 2017  her LLE was getting weaker and the right leg was getting stronger. Patient had follow up with neurosurgery. She was scheduled  for a steriod shot Jul 23, 2017, but on May 3 her legs didnt  feel right, she fell 4 times, she fractured her R fibula  on may 3rd, she went to hospital and spent one week. She went to SNF for 1 month. She had another neuro consult, resulting in kDx of  polyneuropathy. Then she had an apt with and [facility] said it was not polyneuropathy, (in [place])  She has a second consultation scheduled Dec 30 for a  Neuromuscular.consult.  She had a scan of the spine T 12-28-17  who then sent her to a thoracic surgeon at the end of November and was recommended to not due surgery. She does not know what her Dx is and she has been having home PT beginning August 25, 2017 . She has had HHPT 2 x  week for the last 5 months. She is able to walk 50 feet with RW. She is able to transfer from wc to stand independently.     How long can you stand comfortably?  5 mins    Patient  Stated Goals  to walk without the RW, or walk better, use the bathroom    Currently in Pain?  No/denies    Pain Score  0-No pain      Treatment:  Standing with RW x 6 mins 45 sec and sBA  Bridging x 10 x 2   Hooklying marching LLE only x 10 x 2 with assist   hooklying abd/add LLE only x 10 x 2   Sit to stand from 21 inch mat with assist to stabilize with RW  CGA and Min to mod verbal cues used throughout with increased in postural sway and LOB most seen with narrow base of support and while on uneven surfaces. Continues to have balance deficits typical with diagnosis. Patient performs intermediate level exercises without pain behaviors and needs verbal cuing for postural alignment                     PT Education - 03/21/18 1425    Education provided  Yes    Education Details  HEP    Person(s) Educated  Patient    Methods  Explanation    Comprehension  Verbalized understanding;Need further instruction       PT Short Term Goals - 03/07/18 1345      PT SHORT TERM GOAL #1   Title  Patient will be independent in home exercise program to improve strength/mobility for better functional independence with ADLs.    Time  6    Period  Weeks    Status  New    Target Date  04/18/18        PT Long Term Goals - 03/07/18 1223      PT LONG TERM GOAL #1   Title  Patient will increase 10 meter walk test to >.50 m/s as to improve gait speed for better community ambulation and to reduce fall risk.    Time  8    Period  Weeks    Status  New    Target Date  05/02/18      PT LONG TERM GOAL #2   Title  Patient will transfer sit to stand  from 19 inch mat without assist or anyone supporting AD  to demonstrate improved LE strength to decrease falls risk.     Time  12    Period  Weeks    Status  New    Target Date  05/02/18      PT LONG TERM GOAL #3   Title  Patient will demonstrate stance without AD  x 1 min to demonstrate decreased falls risk.     Time  12     Period  Weeks    Status  New    Target Date  05/02/18      PT LONG TERM GOAL #4   Title  Patient will  ambulate 500 feet to work towards community ambulation distances.     Time  12    Period  Weeks    Status  New    Target Date  05/02/18            Plan - 03/21/18 1426    Clinical Impression Statement  Patient performs standing for 6 mins 45 sec and transfer training from 21 inche mat. She performs bridges, hooklying marching and left hip ER/IR from hooklying position with fatigue after 10 reps and ability to perform mustiple sets. Patient will continue to benefit from skilled PT to improve strength.     Rehab Potential  Fair    Clinical Impairments Affecting Rehab Potential  Multiple tests concerning for neurologic involvement     PT Frequency  2x / week    PT Duration  12 weeks    PT Treatment/Interventions  Dry needling;Balance training;Neuromuscular re-education;Gait training;DME Instruction;Aquatic Therapy;Cryotherapy;Electrical Stimulation;Ultrasound;Moist Heat;Iontophoresis 4mg /ml Dexamethasone;Stair training;Functional mobility training;Therapeutic activities;Therapeutic exercise;Orthotic Fit/Training;Patient/family education;Manual techniques;Wheelchair mobility training    PT Next Visit Plan  Progress quadricep/hamstring strengthening in [place], posterior hip musculature strengthening as tolerated.     PT Home Exercise Plan  need to do next visit as she was late to eval apt , she has HEP from [place] and Agree with Plan of Care  Patient;Family member/caregiver    Family Member Consulted  Spouse        Patient will benefit from skilled therapeutic intervention in order to improve the following deficits and impairments:  Abnormal gait, Pain, Decreased endurance, Decreased activity tolerance, Decreased strength, Obesity, Difficulty walking, Decreased balance, Decreased safety awareness, Increased edema, Impaired sensation, Decreased knowledge of use of DME  Visit  Diagnosis: Muscle weakness (generalized)  Difficulty in walking, not elsewhere classified  Pain in right ankle and joints of right foot  Weakness of right leg  Pain in right hip     Problem List Patient Active Problem List   Diagnosis Date Noted  . Migraine without status migrainosus, not intractable   . Chronic pain syndrome   . Lumbar disc disease with radiculopathy 07/21/2017  . Lumbar foraminal stenosis 07/21/2017  . Unable to walk 07/21/2017  . Multiple falls 07/21/2017  . Displaced fracture of proximal end of right fibula 07/21/2017  . Lower extremity weakness 05/28/2017  . Right lower quadrant abdominal pain 08/22/2016  . Abnormal TSH 01/10/2016  . Morbid obesity ([place]) 01/12/2014  . Routine general medical examination at a health care facility 10/28/2012  . GERD (gastroesophageal reflux disease) 10/28/2012  . Anxiety 12/20/2011  . Allergic rhinitis 12/20/2011  . Asthma 12/20/2011  . Lapband APL with HH repair 03/31/2011  . Uncontrolled type 2 diabetes mellitus with hyperglycemia, with long-term current use of insulin ([place]) 06/30/2007  . Hyperlipidemia 06/30/2007  . Essential hypertension 06/27/2007    Alanson Puls, PT DPT 03/21/2018, 2:30 PM  [place] MAIN [facility] SERVICES [address] [place], [place], [address] Phone: [phone]   Fax:  [phone]  Name: [name] MRN: [id number]  Date of Birth: 11-Dec-1965

## 2018-03-26 ENCOUNTER — Encounter: Payer: Self-pay | Admitting: Physical Therapy

## 2018-03-26 ENCOUNTER — Ambulatory Visit: Payer: BC Managed Care – PPO | Admitting: Physical Therapy

## 2018-03-26 DIAGNOSIS — R29898 Other symptoms and signs involving the musculoskeletal system: Secondary | ICD-10-CM

## 2018-03-26 DIAGNOSIS — R262 Difficulty in walking, not elsewhere classified: Secondary | ICD-10-CM

## 2018-03-26 DIAGNOSIS — M6281 Muscle weakness (generalized): Secondary | ICD-10-CM | POA: Diagnosis not present

## 2018-03-26 DIAGNOSIS — M25551 Pain in right hip: Secondary | ICD-10-CM

## 2018-03-26 DIAGNOSIS — M25571 Pain in right ankle and joints of right foot: Secondary | ICD-10-CM

## 2018-03-26 NOTE — Therapy (Signed)
[place] MAIN [facility] SERVICES [address] [place], [place], [address] Phone: [phone]   Fax:  [phone]  Physical Therapy Treatment  Patient Details  Name: [name] MRN: [id number] Date of Birth: 25-Jun-1965 Referring Provider (PT): Pleas Koch   Encounter Date: 03/26/2018  PT End of Session - 03/26/18 1404    Visit Number  5    Number of Visits  17    Date for PT Re-Evaluation  05/02/18    PT Start Time  0145    PT Stop Time  0230    PT Time Calculation (min)  45 min    Equipment Utilized During Treatment  Gait belt    Activity Tolerance  Patient tolerated treatment well;Patient limited by fatigue    Behavior During Therapy  WFL for tasks assessed/performed       Past Medical History:  Diagnosis Date  . Allergy   . Anemia   . Anxiety   . Arthritis   . Asthma   . Colon polyp   . Complication of anesthesia   . Diabetes mellitus   . Edema   . Fatigue   . Hypertension   . IBS (irritable bowel syndrome)   . Lump in female breast   . Methicillin resistant Staphylococcus aureus in conditions classified elsewhere and of unspecified site   . Migraines   . Morbid obesity ([place])   . Night sweats   . Nonspecific abnormal results of thyroid function study   . Other B-complex deficiencies   . Paresthesias   . PONV (postoperative nausea and vomiting)   . Pure hypercholesterolemia   . Reflux   . Sacral fracture ([place])   . Sinus problem   . Symptomatic states associated with artificial menopause   . Thyroid disease   . Type II or unspecified type diabetes mellitus without mention of complication, not stated as uncontrolled   . Unspecified sleep apnea     Past Surgical History:  Procedure Laterality Date  . abcess removal  1997   MRSA  . ABDOMINAL HYSTERECTOMY  1997   complete in 1997, partial was in 1995  . carpal tunnel release r  07/03/13   [facility]orf  . [place]  . CHOLECYSTECTOMY   03/1989  . COLONOSCOPY  03/21/2011   Normal.  [facility].  [name] KNEE SURGERY  2006  . LAPAROSCOPIC GASTRIC BANDING  05/2009  . TONSILLECTOMY AND ADENOIDECTOMY  1974  . TUBAL LIGATION    . ULNAR TUNNEL RELEASE Right 02/05/2018   Procedure: CUBITAL TUNNEL RELEASE/DECOMPRESSION;  Surgeon: Melrose Nakayama, MD;  Location: [place];  Service: Orthopedics;  Laterality: Right;    There were no vitals filed for this visit.  Subjective Assessment - 03/26/18 1403    Subjective  Patient reports she is having 0/10 pain in her legs today, just stiff knees. Reports that she is doing her HEP. [name]    Pertinent History   Patient fx her sacrum 9/18 and  she lost sensation to right foot, and she lost ability to DF her right foot. Patient had back surgery 03/2017 , she was sent home. She was walking with no device indoors and she could get out of the house and do the steps. June 18, 2017  her LLE was getting weaker and the right leg was getting stronger. Patient had follow up with neurosurgery. She was scheduled for a steriod shot Jul 23, 2017, but on May 3 her legs didnt  feel right, she fell 4 times, she fractured her R fibula  on may 3rd, she went to hospital and spent one week. She went to SNF for 1 month. She had another neuro consult, resulting in kDx of  polyneuropathy. Then she had an apt with and [facility] said it was not polyneuropathy, (in [place])  She has a second consultation scheduled Dec 30 for a  Neuromuscular.consult.  She had a scan of the spine T 12-28-17  who then sent her to a thoracic surgeon at the end of November and was recommended to not due surgery. She does not know what her Dx is and she has been having home PT beginning August 25, 2017 . She has had HHPT 2 x  week for the last 5 months. She is able to walk 50 feet with RW. She is able to transfer from wc to stand independently.     How long can you stand comfortably?  5 mins    Patient Stated Goals  to walk without the RW, or walk better, use the bathroom        Treatment: wc <> mat transfer with CGA and assist to steady the RW initially Standing sit to stand x 5  from 21 inch mat , assist to steady the RW Seated marching x 10 BLE, holding onto RW and mat table behind, assist to steady the RW Seated LAQ with 3 sec hold x 10  X 2 BLE seated hip abd /ER with RTB x 15 x 2  Seated knee flex with RTB x 15 x 2   Gait training with RW 100 feet, 75 feet with wc following and SBA x 1  Patient has poor stepping control and fatigue following ambulation and strengthening.                      PT Education - 03/26/18 1404    Education provided  Yes    Education Details  HEP    Person(s) Educated  Patient    Methods  Explanation    Comprehension  Verbalized understanding;Need further instruction       PT Short Term Goals - 03/07/18 1345      PT SHORT TERM GOAL #1   Title  Patient will be independent in home exercise program to improve strength/mobility for better functional independence with ADLs.    Time  6    Period  Weeks    Status  New    Target Date  04/18/18        PT Long Term Goals - 03/07/18 1223      PT LONG TERM GOAL #1   Title  Patient will increase 10 meter walk test to >.50 m/s as to improve gait speed for better community ambulation and to reduce fall risk.    Time  8    Period  Weeks    Status  New    Target Date  05/02/18      PT LONG TERM GOAL #2   Title  Patient will transfer sit to stand  from 19 inch mat without assist or anyone supporting AD  to demonstrate improved LE strength to decrease falls risk.     Time  12    Period  Weeks    Status  New    Target Date  05/02/18      PT LONG TERM GOAL #3   Title  Patient will demonstrate stance without AD  x 1 min to demonstrate  decreased falls risk.     Time  12    Period  Weeks    Status  New    Target Date  05/02/18      PT LONG TERM GOAL #4   Title  Patient will  ambulate 500 feet to work towards community ambulation distances.     Time  12     Period  Weeks    Status  New    Target Date  05/02/18              Patient will benefit from skilled therapeutic intervention in order to improve the following deficits and impairments:     Visit Diagnosis: Muscle weakness (generalized)  Difficulty in walking, not elsewhere classified  Pain in right ankle and joints of right foot  Weakness of right leg  Pain in right hip     Problem List Patient Active Problem List   Diagnosis Date Noted  . Migraine without status migrainosus, not intractable   . Chronic pain syndrome   . Lumbar disc disease with radiculopathy 07/21/2017  . Lumbar foraminal stenosis 07/21/2017  . Unable to walk 07/21/2017  . Multiple falls 07/21/2017  . Displaced fracture of proximal end of right fibula 07/21/2017  . Lower extremity weakness 05/28/2017  . Right lower quadrant abdominal pain 08/22/2016  . Abnormal TSH 01/10/2016  . Morbid obesity ([place]) 01/12/2014  . Routine general medical examination at a health care facility 10/28/2012  . GERD (gastroesophageal reflux disease) 10/28/2012  . Anxiety 12/20/2011  . Allergic rhinitis 12/20/2011  . Asthma 12/20/2011  . Lapband APL with HH repair 03/31/2011  . Uncontrolled type 2 diabetes mellitus with hyperglycemia, with long-term current use of insulin ([place]) 06/30/2007  . Hyperlipidemia 06/30/2007  . Essential hypertension 06/27/2007    Arelia Sneddon S,PT DPT 03/26/2018, 3:00 PM  [place] MAIN [facility] SERVICES [address] [place], [place], [address] Phone: [phone]   Fax:  [phone]  Name: PALLIE SWIGERT MRN: [id number] Date of Birth: April 23, 1965

## 2018-03-28 ENCOUNTER — Encounter: Payer: Self-pay | Admitting: Physical Therapy

## 2018-03-28 ENCOUNTER — Ambulatory Visit: Payer: BC Managed Care – PPO | Admitting: Physical Therapy

## 2018-03-28 DIAGNOSIS — M25551 Pain in right hip: Secondary | ICD-10-CM

## 2018-03-28 DIAGNOSIS — M25571 Pain in right ankle and joints of right foot: Secondary | ICD-10-CM

## 2018-03-28 DIAGNOSIS — R29898 Other symptoms and signs involving the musculoskeletal system: Secondary | ICD-10-CM

## 2018-03-28 DIAGNOSIS — R262 Difficulty in walking, not elsewhere classified: Secondary | ICD-10-CM

## 2018-03-28 DIAGNOSIS — M6281 Muscle weakness (generalized): Secondary | ICD-10-CM | POA: Diagnosis not present

## 2018-03-28 NOTE — Therapy (Signed)
[place] MAIN [facility] SERVICES [address] [place], [place], [address] Phone: [phone]   Fax:  [phone]  Physical Therapy Treatment  Patient Details  Name: [name] MRN: [id number] Date of Birth: 1966/02/16 Referring Provider (PT): Pleas Koch   Encounter Date: 03/28/2018  PT End of Session - 03/28/18 1301    Visit Number  6    Number of Visits  17    Date for PT Re-Evaluation  05/02/18    PT Start Time  0100    PT Stop Time  0145    PT Time Calculation (min)  45 min    Equipment Utilized During Treatment  Gait belt    Activity Tolerance  Patient tolerated treatment well;Patient limited by fatigue    Behavior During Therapy  WFL for tasks assessed/performed       Past Medical History:  Diagnosis Date  . Allergy   . Anemia   . Anxiety   . Arthritis   . Asthma   . Colon polyp   . Complication of anesthesia   . Diabetes mellitus   . Edema   . Fatigue   . Hypertension   . IBS (irritable bowel syndrome)   . Lump in female breast   . Methicillin resistant Staphylococcus aureus in conditions classified elsewhere and of unspecified site   . Migraines   . Morbid obesity ([place])   . Night sweats   . Nonspecific abnormal results of thyroid function study   . Other B-complex deficiencies   . Paresthesias   . PONV (postoperative nausea and vomiting)   . Pure hypercholesterolemia   . Reflux   . Sacral fracture ([place])   . Sinus problem   . Symptomatic states associated with artificial menopause   . Thyroid disease   . Type II or unspecified type diabetes mellitus without mention of complication, not stated as uncontrolled   . Unspecified sleep apnea     Past Surgical History:  Procedure Laterality Date  . abcess removal  1997   MRSA  . ABDOMINAL HYSTERECTOMY  1997   complete in 1997, partial was in 1995  . carpal tunnel release r  07/03/13   [facility]  . [place]  . CHOLECYSTECTOMY   03/1989  . COLONOSCOPY  03/21/2011   Normal.  [facility].  [name] KNEE SURGERY  2006  . LAPAROSCOPIC GASTRIC BANDING  05/2009  . TONSILLECTOMY AND ADENOIDECTOMY  1974  . TUBAL LIGATION    . ULNAR TUNNEL RELEASE Right 02/05/2018   Procedure: CUBITAL TUNNEL RELEASE/DECOMPRESSION;  Surgeon: Melrose Nakayama, MD;  Location: [place];  Service: Orthopedics;  Laterality: Right;    There were no vitals filed for this visit.  Subjective Assessment - 03/28/18 1258    Subjective  Patient reports she is having 4/10 pain in her  right  leg today, 2/10  knees. Reports that she is doing her HEP. [name]    Pertinent History   Patient fx her sacrum 9/18 and  she lost sensation to right foot, and she lost ability to DF her right foot. Patient had back surgery 03/2017 , she was sent home. She was walking with no device indoors and she could get out of the house and do the steps. June 18, 2017  her LLE was getting weaker and the right leg was getting stronger. Patient had follow up with neurosurgery. She was scheduled for a steriod shot Jul 23, 2017, but on May 3 her  legs didnt  feel right, she fell 4 times, she fractured her R fibula  on may 3rd, she went to hospital and spent one week. She went to SNF for 1 month. She had another neuro consult, resulting in kDx of  polyneuropathy. Then she had an apt with and [facility] said it was not polyneuropathy, (in [place])  She has a second consultation scheduled Dec 30 for a  Neuromuscular.consult.  She had a scan of the spine T 12-28-17  who then sent her to a thoracic surgeon at the end of November and was recommended to not due surgery. She does not know what her Dx is and she has been having home PT beginning August 25, 2017 . She has had HHPT 2 x  week for the last 5 months. She is able to walk 50 feet with RW. She is able to transfer from wc to stand independently.     How long can you stand comfortably?  5 mins    Patient Stated Goals  to walk without the RW, or walk better, use the bathroom     Currently in Pain?  Yes    Pain Score  4     Pain Location  Leg    Pain Orientation  Right    Pain Descriptors / Indicators  Aching    Pain Type  Chronic pain    Aggravating Factors   walking    Pain Relieving Factors  rest       Treatment: Interval stand to sit with 3 holds x 3 reps   AAROM BLE hip abd/add x 10 x 2 Left LE greater assist than RLE  AAROM BLE flex/ext x 10 x 2 , Left LE greater assist than RLE  wc <> mat transfer with CGA and assist to steady the RW initially  Standing sit to stand x 5 from 21 inch mat , assist to steady the RW and more assist as the repetitions increased , mod assist  Supine marching x 10 BLE, holding onto RW and mat table behind, assist to steady the RW  Supine  hip abd /ER with RTB x 15 x 2   Supine AROM/ AAROM hip  flex /ext x 15 x 2   Gait training with RW , 80  Feet x 2  with wc following and SBA x 1   Patient has poor stepping control and fatigue following ambulation and strengthening.                      PT Education - 03/28/18 1308    Education provided  Yes    Education Details  HEP    Person(s) Educated  Patient    Methods  Explanation    Comprehension  Verbalized understanding       PT Short Term Goals - 03/07/18 1345      PT SHORT TERM GOAL #1   Title  Patient will be independent in home exercise program to improve strength/mobility for better functional independence with ADLs.    Time  6    Period  Weeks    Status  New    Target Date  04/18/18        PT Long Term Goals - 03/07/18 1223      PT LONG TERM GOAL #1   Title  Patient will increase 10 meter walk test to >.50 m/s as to improve gait speed for better community ambulation and to reduce fall risk.    Time  8    Period  Weeks    Status  New    Target Date  05/02/18      PT LONG TERM GOAL #2   Title  Patient will transfer sit to stand  from 19 inch mat without assist or anyone supporting AD  to demonstrate improved LE strength to  decrease falls risk.     Time  12    Period  Weeks    Status  New    Target Date  05/02/18      PT LONG TERM GOAL #3   Title  Patient will demonstrate stance without AD  x 1 min to demonstrate decreased falls risk.     Time  12    Period  Weeks    Status  New    Target Date  05/02/18      PT LONG TERM GOAL #4   Title  Patient will  ambulate 500 feet to work towards community ambulation distances.     Time  12    Period  Weeks    Status  New    Target Date  05/02/18            Plan - 03/28/18 1308    Clinical Impression Statement  Patient performs AAROM, AROM of BLE in closed chain and open chain positions for strengthening. She is able to do interval stand to sit with 2 intervals with posture correction and technique corrections. Patient will continue to benefit from skilled PT to improve mobility    Rehab Potential  Fair    Clinical Impairments Affecting Rehab Potential  Multiple tests concerning for neurologic involvement     PT Frequency  2x / week    PT Duration  12 weeks    PT Treatment/Interventions  Dry needling;Balance training;Neuromuscular re-education;Gait training;DME Instruction;Aquatic Therapy;Cryotherapy;Electrical Stimulation;Ultrasound;Moist Heat;Iontophoresis 4mg /ml Dexamethasone;Stair training;Functional mobility training;Therapeutic activities;Therapeutic exercise;Orthotic Fit/Training;Patient/family education;Manual techniques;Wheelchair mobility training    PT Next Visit Plan  Progress quadricep/hamstring strengthening in [place], posterior hip musculature strengthening as tolerated.     PT Home Exercise Plan  need to do next visit as she was late to eval apt , she has HEP from [place] and Agree with Plan of Care  Patient;Family member/caregiver    Family Member Consulted  Spouse        Patient will benefit from skilled therapeutic intervention in order to improve the following deficits and impairments:  Abnormal gait, Pain, Decreased endurance,  Decreased activity tolerance, Decreased strength, Obesity, Difficulty walking, Decreased balance, Decreased safety awareness, Increased edema, Impaired sensation, Decreased knowledge of use of DME  Visit Diagnosis: Muscle weakness (generalized)  Difficulty in walking, not elsewhere classified  Pain in right ankle and joints of right foot  Weakness of right leg  Pain in right hip     Problem List Patient Active Problem List   Diagnosis Date Noted  . Migraine without status migrainosus, not intractable   . Chronic pain syndrome   . Lumbar disc disease with radiculopathy 07/21/2017  . Lumbar foraminal stenosis 07/21/2017  . Unable to walk 07/21/2017  . Multiple falls 07/21/2017  . Displaced fracture of proximal end of right fibula 07/21/2017  . Lower extremity weakness 05/28/2017  . Right lower quadrant abdominal pain 08/22/2016  . Abnormal TSH 01/10/2016  . Morbid obesity ([place]) 01/12/2014  . Routine general medical examination at a health care facility 10/28/2012  . GERD (gastroesophageal reflux disease) 10/28/2012  . Anxiety 12/20/2011  . Allergic rhinitis 12/20/2011  .  Asthma 12/20/2011  . Lapband APL with HH repair 03/31/2011  . Uncontrolled type 2 diabetes mellitus with hyperglycemia, with long-term current use of insulin ([place]) 06/30/2007  . Hyperlipidemia 06/30/2007  . Essential hypertension 06/27/2007    Alanson Puls, PT DPT 03/28/2018, 1:35 PM  [place] MAIN [facility] SERVICES [address] [place], [place], [address] Phone: [phone]   Fax:  [phone]  Name: [name] MRN: [id number] Date of Birth: May 26, 1965

## 2018-04-02 ENCOUNTER — Encounter: Payer: Self-pay | Admitting: Physical Therapy

## 2018-04-02 ENCOUNTER — Ambulatory Visit: Payer: BC Managed Care – PPO | Admitting: Physical Therapy

## 2018-04-02 DIAGNOSIS — R262 Difficulty in walking, not elsewhere classified: Secondary | ICD-10-CM

## 2018-04-02 DIAGNOSIS — M25551 Pain in right hip: Secondary | ICD-10-CM

## 2018-04-02 DIAGNOSIS — R29898 Other symptoms and signs involving the musculoskeletal system: Secondary | ICD-10-CM

## 2018-04-02 DIAGNOSIS — M6281 Muscle weakness (generalized): Secondary | ICD-10-CM

## 2018-04-02 DIAGNOSIS — M25571 Pain in right ankle and joints of right foot: Secondary | ICD-10-CM

## 2018-04-02 NOTE — Therapy (Signed)
[place] MAIN [facility] SERVICES [address] [place], [place], [address] Phone: [phone]   Fax:  [phone]  Physical Therapy Treatment  Patient Details  Name: [name] MRN: [id number] Date of Birth: [birth year] Referring Provider (PT): Pleas Koch   Encounter Date: 04/02/2018  PT End of Session - 04/02/18 1129    Visit Number  7    Number of Visits  17    Date for PT Re-Evaluation  05/02/18    PT Start Time  1100    PT Stop Time  1145    PT Time Calculation (min)  45 min    Equipment Utilized During Treatment  Gait belt    Activity Tolerance  Patient tolerated treatment well;Patient limited by fatigue    Behavior During Therapy  [facility] for tasks assessed/performed       Past Medical History:  Diagnosis Date  . Allergy   . Anemia   . Anxiety   . Arthritis   . Asthma   . Colon polyp   . Complication of anesthesia   . Diabetes mellitus   . Edema   . Fatigue   . Hypertension   . IBS (irritable bowel syndrome)   . Lump in female breast   . Methicillin resistant Staphylococcus aureus in conditions classified elsewhere and of unspecified site   . Migraines   . Morbid obesity ([place])   . Night sweats   . Nonspecific abnormal results of thyroid function study   . Other B-complex deficiencies   . Paresthesias   . PONV (postoperative nausea and vomiting)   . Pure hypercholesterolemia   . Reflux   . Sacral fracture ([place])   . Sinus problem   . Symptomatic states associated with artificial menopause   . Thyroid disease   . Type II or unspecified type diabetes mellitus without mention of complication, not stated as uncontrolled   . Unspecified sleep apnea     Past Surgical History:  Procedure Laterality Date  . abcess removal  1997   MRSA  . ABDOMINAL HYSTERECTOMY  1997   complete in 1997, partial was in 1995  . carpal tunnel release r  07/03/13   Dalldorf  . [place]  . CHOLECYSTECTOMY   03/1989  . COLONOSCOPY  03/21/2011   Normal.  [facility].  [name] KNEE SURGERY  2006  . LAPAROSCOPIC GASTRIC BANDING  05/2009  . TONSILLECTOMY AND ADENOIDECTOMY  1974  . TUBAL LIGATION    . ULNAR TUNNEL RELEASE Right 02/05/2018   Procedure: CUBITAL TUNNEL RELEASE/DECOMPRESSION;  Surgeon: Melrose Nakayama, MD;  Location: [place];  Service: Orthopedics;  Laterality: Right;    There were no vitals filed for this visit.  Subjective Assessment - 04/02/18 1102    Subjective  Patient reports she is having 0/10 pain today,. Reports that she is doing her HEP. .  (Pended)     Pertinent History   Patient fx her sacrum 9/18 and  she lost sensation to right foot, and she lost ability to DF her right foot. Patient had back surgery 03/2017 , she was sent home. She was walking with no device indoors and she could get out of the house and do the steps. June 18, 2017  her LLE was getting weaker and the right leg was getting stronger. Patient had follow up with neurosurgery. She was scheduled for a steriod shot Jul 23, 2017, but on May 3 her legs didnt  feel right, she  fell 4 times, she fractured her R fibula  on may 3rd, she went to hospital and spent one week. She went to SNF for 1 month. She had another neuro consult, resulting in kDx of  polyneuropathy. Then she had an apt with and [facility] said it was not polyneuropathy, (in [place])  She has a second consultation scheduled Dec 30 for a  Neuromuscular.consult.  She had a scan of the spine T 12-28-17  who then sent her to a thoracic surgeon at the end of November and was recommended to not due surgery. She does not know what her Dx is and she has been having home PT beginning August 25, 2017 . She has had HHPT 2 x  week for the last 5 months. She is able to walk 50 feet with RW. She is able to transfer from wc to stand independently.   (Pended)     How long can you stand comfortably?  5 mins  (Pended)     Patient Stated Goals  to walk without the RW, or walk better, use the bathroom   (Pended)     Currently in Pain?  No/denies  (Pended)     Pain Score  0-No pain  (Pended)        Treatment: In parallel bars: Standing hip abd x 10 x 2 BLE Standing hip ext x 10 x 2 BLE Standing marching x 10 x 2 BLE Side stepping in parallel bars 5 feet x 2   Gait training with RW on firm surfaces and carpet 173 feet with wc following and B ankle AFO, with wide BOS and heavy weight bearing on RW for additional support Transfer training sit to stand in parallel bars x 5 reps, with last several inches uncontrolled descent   Patient needs UE support for standing exercises and has poor control of hip abd and hip extension in standing and muscle fatigue during exercises.                        PT Education - 04/02/18 1129    Education provided  Yes    Education Details  progression of HEP    Person(s) Educated  Patient;Spouse    Methods  Explanation;Demonstration;Verbal cues;Tactile cues    Comprehension  Verbalized understanding;Need further instruction;Returned demonstration;Verbal cues required       PT Short Term Goals - 03/07/18 1345      PT SHORT TERM GOAL #1   Title  Patient will be independent in home exercise program to improve strength/mobility for better functional independence with ADLs.    Time  6    Period  Weeks    Status  New    Target Date  04/18/18        PT Long Term Goals - 03/07/18 1223      PT LONG TERM GOAL #1   Title  Patient will increase 10 meter walk test to >.50 m/s as to improve gait speed for better community ambulation and to reduce fall risk.    Time  8    Period  Weeks    Status  New    Target Date  05/02/18      PT LONG TERM GOAL #2   Title  Patient will transfer sit to stand  from 19 inch mat without assist or anyone supporting AD  to demonstrate improved LE strength to decrease falls risk.     Time  12    Period  Weeks  Status  New    Target Date  05/02/18      PT LONG TERM GOAL #3   Title  Patient will  demonstrate stance without AD  x 1 min to demonstrate decreased falls risk.     Time  12    Period  Weeks    Status  New    Target Date  05/02/18      PT LONG TERM GOAL #4   Title  Patient will  ambulate 500 feet to work towards community ambulation distances.     Time  12    Period  Weeks    Status  New    Target Date  05/02/18              Patient will benefit from skilled therapeutic intervention in order to improve the following deficits and impairments:     Visit Diagnosis: Muscle weakness (generalized)  Difficulty in walking, not elsewhere classified  Pain in right ankle and joints of right foot  Weakness of right leg  Pain in right hip     Problem List Patient Active Problem List   Diagnosis Date Noted  . Migraine without status migrainosus, not intractable   . Chronic pain syndrome   . Lumbar disc disease with radiculopathy 07/21/2017  . Lumbar foraminal stenosis 07/21/2017  . Unable to walk 07/21/2017  . Multiple falls 07/21/2017  . Displaced fracture of proximal end of right fibula 07/21/2017  . Lower extremity weakness 05/28/2017  . Right lower quadrant abdominal pain 08/22/2016  . Abnormal TSH 01/10/2016  . Morbid obesity ([place]) 01/12/2014  . Routine general medical examination at a health care facility 10/28/2012  . GERD (gastroesophageal reflux disease) 10/28/2012  . Anxiety 12/20/2011  . Allergic rhinitis 12/20/2011  . Asthma 12/20/2011  . Lapband APL with HH repair 03/31/2011  . Uncontrolled type 2 diabetes mellitus with hyperglycemia, with long-term current use of insulin ([place]) 06/30/2007  . Hyperlipidemia 06/30/2007  . Essential hypertension 06/27/2007    Alanson Puls, PT DPT 04/02/2018, 11:36 AM  [place] MAIN [facility] SERVICES [address] [place], [place], [address] Phone: [phone]   Fax:  [phone]  Name: [name] MRN: [id number] Date of Birth: [birth year]

## 2018-04-04 ENCOUNTER — Ambulatory Visit: Payer: BC Managed Care – PPO | Admitting: Physical Therapy

## 2018-04-04 ENCOUNTER — Encounter: Payer: Self-pay | Admitting: Physical Therapy

## 2018-04-04 DIAGNOSIS — M6281 Muscle weakness (generalized): Secondary | ICD-10-CM | POA: Diagnosis not present

## 2018-04-04 DIAGNOSIS — R29898 Other symptoms and signs involving the musculoskeletal system: Secondary | ICD-10-CM

## 2018-04-04 DIAGNOSIS — R262 Difficulty in walking, not elsewhere classified: Secondary | ICD-10-CM

## 2018-04-04 DIAGNOSIS — M25571 Pain in right ankle and joints of right foot: Secondary | ICD-10-CM

## 2018-04-04 DIAGNOSIS — M25551 Pain in right hip: Secondary | ICD-10-CM

## 2018-04-04 NOTE — Therapy (Signed)
[place] MAIN [facility] SERVICES [address] [place], [place], [address] Phone: [phone]   Fax:  [phone]  Physical Therapy Treatment  Patient Details  Name: [name] MRN: [id number] Date of Birth: May 16, 1965 Referring Provider (PT): Pleas Koch   Encounter Date: 04/04/2018  PT End of Session - 04/04/18 1333    Visit Number  8    Number of Visits  17    Date for PT Re-Evaluation  05/02/18    PT Start Time  0103    PT Stop Time  0145    PT Time Calculation (min)  42 min    Equipment Utilized During Treatment  Gait belt    Activity Tolerance  Patient tolerated treatment well;Patient limited by fatigue    Behavior During Therapy  WFL for tasks assessed/performed       Past Medical History:  Diagnosis Date  . Allergy   . Anemia   . Anxiety   . Arthritis   . Asthma   . Colon polyp   . Complication of anesthesia   . Diabetes mellitus   . Edema   . Fatigue   . Hypertension   . IBS (irritable bowel syndrome)   . Lump in female breast   . Methicillin resistant Staphylococcus aureus in conditions classified elsewhere and of unspecified site   . Migraines   . Morbid obesity ([place])   . Night sweats   . Nonspecific abnormal results of thyroid function study   . Other B-complex deficiencies   . Paresthesias   . PONV (postoperative nausea and vomiting)   . Pure hypercholesterolemia   . Reflux   . Sacral fracture ([place])   . Sinus problem   . Symptomatic states associated with artificial menopause   . Thyroid disease   . Type II or unspecified type diabetes mellitus without mention of complication, not stated as uncontrolled   . Unspecified sleep apnea     Past Surgical History:  Procedure Laterality Date  . abcess removal  1997   MRSA  . ABDOMINAL HYSTERECTOMY  1997   complete in 1997, partial was in 1995  . carpal tunnel release r  07/03/13   [place]alldorf  . [place]  . CHOLECYSTECTOMY   03/1989  . COLONOSCOPY  03/21/2011   Normal.  [facility].  [name] KNEE SURGERY  2006  . LAPAROSCOPIC GASTRIC BANDING  05/2009  . TONSILLECTOMY AND ADENOIDECTOMY  1974  . TUBAL LIGATION    . ULNAR TUNNEL RELEASE Right 02/05/2018   Procedure: CUBITAL TUNNEL RELEASE/DECOMPRESSION;  Surgeon: Melrose Nakayama, MD;  Location: [place];  Service: Orthopedics;  Laterality: Right;    There were no vitals filed for this visit.  Subjective Assessment - 04/04/18 1332    Subjective  Patient reports she is having no pain .[name] Reports that she is doing her HEP. [name]    Pertinent History   Patient fx her sacrum 9/18 and  she lost sensation to right foot, and she lost ability to DF her right foot. Patient had back surgery 03/2017 , she was sent home. She was walking with no device indoors and she could get out of the house and do the steps. June 18, 2017  her LLE was getting weaker and the right leg was getting stronger. Patient had follow up with neurosurgery. She was scheduled for a steriod shot Jul 23, 2017, but on May 3 her legs didnt  feel right, she fell 4 times,  she fractured her R fibula  on may 3rd, she went to hospital and spent one week. She went to SNF for 1 month. She had another neuro consult, resulting in kDx of  polyneuropathy. Then she had an apt with and [facility] said it was not polyneuropathy, (in [place])  She has a second consultation scheduled Dec 30 for a  Neuromuscular.consult.  She had a scan of the spine T 12-28-17  who then sent her to a thoracic surgeon at the end of November and was recommended to not due surgery. She does not know what her Dx is and she has been having home PT beginning August 25, 2017 . She has had HHPT 2 x  week for the last 5 months. She is able to walk 50 feet with RW. She is able to transfer from wc to stand independently.     How long can you stand comfortably?  5 mins    Patient Stated Goals  to walk without the RW, or walk better, use the bathroom    Currently in Pain?  No/denies     Pain Score  0-No pain      Treatment:  Gait training with RW on firm surfaces and carpet 222 feet with wc following and B ankle AFO, with wide BOS and heavy weight bearing on RW for additional support Transfer training sit to stand in parallel bars x 5 reps, with last several inches uncontrolled descent  Stand to sit with 2 stops x 3 trials  hooklying marching x 20 Hip abd in sidelying x 20 BLE Bridging x 10  Hip abd x 10 x 2  Patient needs UE support for standing exercises and has poor control of hip abd and hip extension in standing and muscle fatigue during exercises.                         PT Education - 04/04/18 1333    Education provided  Yes    Education Details  HEP    Person(s) Educated  Patient    Methods  Explanation    Comprehension  Verbalized understanding       PT Short Term Goals - 03/07/18 1345      PT SHORT TERM GOAL #1   Title  Patient will be independent in home exercise program to improve strength/mobility for better functional independence with ADLs.    Time  6    Period  Weeks    Status  New    Target Date  04/18/18        PT Long Term Goals - 03/07/18 1223      PT LONG TERM GOAL #1   Title  Patient will increase 10 meter walk test to >.50 m/s as to improve gait speed for better community ambulation and to reduce fall risk.    Time  8    Period  Weeks    Status  New    Target Date  05/02/18      PT LONG TERM GOAL #2   Title  Patient will transfer sit to stand  from 19 inch mat without assist or anyone supporting AD  to demonstrate improved LE strength to decrease falls risk.     Time  12    Period  Weeks    Status  New    Target Date  05/02/18      PT LONG TERM GOAL #3   Title  Patient will demonstrate stance without  AD  x 1 min to demonstrate decreased falls risk.     Time  12    Period  Weeks    Status  New    Target Date  05/02/18      PT LONG TERM GOAL #4   Title  Patient will  ambulate 500 feet to work  towards community ambulation distances.     Time  12    Period  Weeks    Status  New    Target Date  05/02/18            Plan - 04/04/18 1424    Clinical Impression Statement  Pt requires direction and verbal cues for correct performance of exercises. Patient demonstrates weakness in BLE and performs open and closed chain exercises with no reports of pain . Pt was able to perform all exercises with mod assist and VC for technique. Patient struggles with speed and control of movement during as well as balance with unstable surfaces.  Pt encouraged continuing  HEP.    Rehab Potential  Fair    Clinical Impairments Affecting Rehab Potential  Multiple tests concerning for neurologic involvement     PT Frequency  2x / week    PT Duration  12 weeks    PT Treatment/Interventions  Dry needling;Balance training;Neuromuscular re-education;Gait training;DME Instruction;Aquatic Therapy;Cryotherapy;Electrical Stimulation;Ultrasound;Moist Heat;Iontophoresis 4mg /ml Dexamethasone;Stair training;Functional mobility training;Therapeutic activities;Therapeutic exercise;Orthotic Fit/Training;Patient/family education;Manual techniques;Wheelchair mobility training    PT Next Visit Plan  Progress quadricep/hamstring strengthening in [place], posterior hip musculature strengthening as tolerated.     PT Home Exercise Plan  need to do next visit as she was late to eval apt , she has HEP from [place] and Agree with Plan of Care  Patient;Family member/caregiver    Family Member Consulted  Spouse        Patient will benefit from skilled therapeutic intervention in order to improve the following deficits and impairments:  Abnormal gait, Pain, Decreased endurance, Decreased activity tolerance, Decreased strength, Obesity, Difficulty walking, Decreased balance, Decreased safety awareness, Increased edema, Impaired sensation, Decreased knowledge of use of DME  Visit Diagnosis: Muscle weakness  (generalized)  Difficulty in walking, not elsewhere classified  Pain in right ankle and joints of right foot  Weakness of right leg  Pain in right hip     Problem List Patient Active Problem List   Diagnosis Date Noted  . Migraine without status migrainosus, not intractable   . Chronic pain syndrome   . Lumbar disc disease with radiculopathy 07/21/2017  . Lumbar foraminal stenosis 07/21/2017  . Unable to walk 07/21/2017  . Multiple falls 07/21/2017  . Displaced fracture of proximal end of right fibula 07/21/2017  . Lower extremity weakness 05/28/2017  . Right lower quadrant abdominal pain 08/22/2016  . Abnormal TSH 01/10/2016  . Morbid obesity ([place]) 01/12/2014  . Routine general medical examination at a health care facility 10/28/2012  . GERD (gastroesophageal reflux disease) 10/28/2012  . Anxiety 12/20/2011  . Allergic rhinitis 12/20/2011  . Asthma 12/20/2011  . Lapband APL with HH repair 03/31/2011  . Uncontrolled type 2 diabetes mellitus with hyperglycemia, with long-term current use of insulin ([place]) 06/30/2007  . Hyperlipidemia 06/30/2007  . Essential hypertension 06/27/2007    Alanson Puls, [place] DPT 04/04/2018, 4:45 PM  [place] MAIN [facility] SERVICES [address] [place], [place], [address] Phone: [phone]   Fax:  [phone]  Name: AMILIAH CAMPISI MRN: [id number] Date of Birth: 26-Jul-1965

## 2018-04-09 ENCOUNTER — Ambulatory Visit: Payer: BC Managed Care – PPO | Admitting: Physical Therapy

## 2018-04-09 ENCOUNTER — Encounter: Payer: Self-pay | Admitting: Physical Therapy

## 2018-04-09 DIAGNOSIS — M6281 Muscle weakness (generalized): Secondary | ICD-10-CM | POA: Diagnosis not present

## 2018-04-09 DIAGNOSIS — R262 Difficulty in walking, not elsewhere classified: Secondary | ICD-10-CM

## 2018-04-09 DIAGNOSIS — M25571 Pain in right ankle and joints of right foot: Secondary | ICD-10-CM

## 2018-04-09 DIAGNOSIS — M25551 Pain in right hip: Secondary | ICD-10-CM

## 2018-04-09 DIAGNOSIS — R29898 Other symptoms and signs involving the musculoskeletal system: Secondary | ICD-10-CM

## 2018-04-09 NOTE — Patient Instructions (Signed)
KNEE: Extension, Long Arc Quads - Sitting    Raise leg until knee is straight. _15__ reps per set, _2__ sets per day, _7_ days per week  Copyright  VHI. All rights reserved.  SIT TO STAND: Device    Feet: shoulder-width apart on floor. Lean chest forward. Place hands on surface and/or walker. Straighten hips and knees to stand. Weight bear equally through left and right side. __10_ reps per set, __2_ sets per day, __7_ days per week Both hands on device. One hand on device.  Copyright  VHI. All rights reserved.  Hip Abduction: Modified    Lying on right side with pillow between thighs, raise top leg from pillow, rotating slightly out. Repeat __10__ times per set. Do _2___ sets per session. Do ___2_ sessions per day.  http://orth.exer.us/705   Copyright  VHI. All rights reserved.  Hip Abduction / Adduction: with Knee Flexion (Supine)    With right knee bent, gently lower knee to side and return. Repeat _10___ times per set. Do ___2_ sets per session. Do _2___ sessions per day.  http://orth.exer.us/683   Copyright  VHI. All rights reserved.  Bridge [employer] small of back into mat, maintain pelvic tilt, roll up one vertebrae at a time. Focus on engaging posterior hip muscles. Hold for _3___ breaths. Repeat __10__ times.  Copyright  VHI. All rights reserved.  Hip Flexion    Standing on one leg, bring other leg up so thigh is parallel to floor. Hold ____ seconds. Repeat on other leg. Do __10__ repetitions, _2___ sets.  http://bt.exer.us/41   Copyright  VHI. All rights reserved.  HIP / KNEE: Flexion, Knee to Chest - Supine    Raise knee to chest. Keep leg in a straight line. Perform slowly. _10__ reps per set, __2_ sets per day, __7_ days per week       Small Ball Marching Supine    Lie supine with, knees flexed. Raise one leg a few inches. Hold briefly, return and raise other leg. Keep hips stationary. Do _10__ sets of __1_  repetitions.  Copyright  VHI. All rights reserved.

## 2018-04-09 NOTE — Therapy (Signed)
[place] MAIN [facility] SERVICES [address] [place], [place], [address] Phone: [phone]   Fax:  [phone]  Physical Therapy Treatment  Patient Details  Name: [name] MRN: [id number] Date of Birth: Oct 30, 1965 Referring Provider (PT): Pleas Koch   Encounter Date: 04/09/2018  PT End of Session - 04/09/18 1443    Visit Number  9    Number of Visits  17    Date for PT Re-Evaluation  05/02/18    PT Start Time  0145    PT Stop Time  0230    PT Time Calculation (min)  45 min    Equipment Utilized During Treatment  Gait belt    Activity Tolerance  Patient tolerated treatment well;Patient limited by fatigue    Behavior During Therapy  WFL for tasks assessed/performed       Past Medical History:  Diagnosis Date  . Allergy   . Anemia   . Anxiety   . Arthritis   . Asthma   . Colon polyp   . Complication of anesthesia   . Diabetes mellitus   . Edema   . Fatigue   . Hypertension   . IBS (irritable bowel syndrome)   . Lump in female breast   . Methicillin resistant Staphylococcus aureus in conditions classified elsewhere and of unspecified site   . Migraines   . Morbid obesity ([place])   . Night sweats   . Nonspecific abnormal results of thyroid function study   . Other B-complex deficiencies   . Paresthesias   . PONV (postoperative nausea and vomiting)   . Pure hypercholesterolemia   . Reflux   . Sacral fracture ([place])   . Sinus problem   . Symptomatic states associated with artificial menopause   . Thyroid disease   . Type II or unspecified type diabetes mellitus without mention of complication, not stated as uncontrolled   . Unspecified sleep apnea     Past Surgical History:  Procedure Laterality Date  . abcess removal  1997   MRSA  . ABDOMINAL HYSTERECTOMY  1997   complete in 1997, partial was in 1995  . carpal tunnel release r  07/03/13   Dal[place]orf  . [place]  . CHOLECYSTECTOMY   03/1989  . COLONOSCOPY  03/21/2011   Normal.  [facility].  [name] KNEE SURGERY  2006  . LAPAROSCOPIC GASTRIC BANDING  05/2009  . TONSILLECTOMY AND ADENOIDECTOMY  1974  . TUBAL LIGATION    . ULNAR TUNNEL RELEASE Right 02/05/2018   Procedure: CUBITAL TUNNEL RELEASE/DECOMPRESSION;  Surgeon: Melrose Nakayama, MD;  Location: [facility];  Service: Orthopedics;  Laterality: Right;    There were no vitals filed for this visit.  Subjective Assessment - 04/09/18 1442    Subjective  Patient reports she is having no pain .[name] Reports that she is doing her HEP. [name]    Pertinent History   Patient fx her sacrum 9/18 and  she lost sensation to right foot, and she lost ability to DF her right foot. Patient had back surgery 03/2017 , she was sent home. She was walking with no device indoors and she could get out of the house and do the steps. June 18, 2017  her LLE was getting weaker and the right leg was getting stronger. Patient had follow up with neurosurgery. She was scheduled for a steriod shot Jul 23, 2017, but on May 3 her legs didnt  feel right, she fell 4 times,  she fractured her R fibula  on may 3rd, she went to hospital and spent one week. She went to SNF for 1 month. She had another neuro consult, resulting in kDx of  polyneuropathy. Then she had an apt with and [facility] said it was not polyneuropathy, (in [place])  She has a second consultation scheduled Dec 30 for a  Neuromuscular.consult.  She had a scan of the spine T 12-28-17  who then sent her to a thoracic surgeon at the end of November and was recommended to not due surgery. She does not know what her Dx is and she has been having home PT beginning August 25, 2017 . She has had HHPT 2 x  week for the last 5 months. She is able to walk 50 feet with RW. She is able to transfer from wc to stand independently.     How long can you stand comfortably?  5 mins    Patient Stated Goals  to walk without the RW, or walk better, use the bathroom    Currently in Pain?  No/denies     Pain Score  0-No pain       Treatment:  Gait training with RW on firm surfaces and carpet 210 feet with wc followingand B ankle AFO, with wide BOS and heavy weight bearing on RW for additional support Transfer training sit to standto RWx 5 reps, with last several inches uncontrolled descent Stand to sit with 2 stops x 3 trials from RW to mat hooklying marching x 20, assist for L hip  Hooklying abd/ER with TA Hip abd in sidelying x 20 BLE Bridging x 10  sidelying Hip abd x 10 x 2  Left Hip hooklying IR/ER x 10 x 2  sidelying hip flex with mod assist LLE and min assist RLE  Patient needs UE support for standing exercises and has poor control of hip abd and hip extension in standing and muscle fatigue during exercises. Min to mod verbal cues used throughout . Continues to have balance deficits typical with diagnosis. Patient performs intermediate level exercises without pain behaviors and needs verbal cuing for postural alignment..                        PT Education - 04/09/18 1442    Education provided  Yes    Education Details  Progression of HEP    Person(s) Educated  Patient;Spouse    Methods  Explanation;Demonstration;Tactile cues;Verbal cues    Comprehension  Verbalized understanding;Returned demonstration;Need further instruction       PT Short Term Goals - 03/07/18 1345      PT SHORT TERM GOAL #1   Title  Patient will be independent in home exercise program to improve strength/mobility for better functional independence with ADLs.    Time  6    Period  Weeks    Status  New    Target Date  04/18/18        PT Long Term Goals - 03/07/18 1223      PT LONG TERM GOAL #1   Title  Patient will increase 10 meter walk test to >.50 m/s as to improve gait speed for better community ambulation and to reduce fall risk.    Time  8    Period  Weeks    Status  New    Target Date  05/02/18      PT LONG TERM GOAL #2   Title  Patient will transfer sit  to stand  from 19 inch mat without assist or anyone supporting AD  to demonstrate improved LE strength to decrease falls risk.     Time  12    Period  Weeks    Status  New    Target Date  05/02/18      PT LONG TERM GOAL #3   Title  Patient will demonstrate stance without AD  x 1 min to demonstrate decreased falls risk.     Time  12    Period  Weeks    Status  New    Target Date  05/02/18      PT LONG TERM GOAL #4   Title  Patient will  ambulate 500 feet to work towards community ambulation distances.     Time  12    Period  Weeks    Status  New    Target Date  05/02/18            Plan - 04/09/18 1443    Clinical Impression Statement  Patient is able to progress HEP due to improved hip strength. She is able to transfer with greater stability and safety due to improved hip strength. She is ambulating 210 feet with RW and wc following and LE AFO. She will continue to improve strength and benefit from conintued skilled PT .     Rehab Potential  Fair    Clinical Impairments Affecting Rehab Potential  Multiple tests concerning for neurologic involvement     PT Frequency  2x / week    PT Duration  12 weeks    PT Treatment/Interventions  Dry needling;Balance training;Neuromuscular re-education;Gait training;DME Instruction;Aquatic Therapy;Cryotherapy;Electrical Stimulation;Ultrasound;Moist Heat;Iontophoresis 4mg /ml Dexamethasone;Stair training;Functional mobility training;Therapeutic activities;Therapeutic exercise;Orthotic Fit/Training;Patient/family education;Manual techniques;Wheelchair mobility training    PT Next Visit Plan  Progress quadricep/hamstring strengthening in [place], posterior hip musculature strengthening as tolerated.     PT Home Exercise Plan  need to do next visit as she was late to eval apt , she has HEP from [place] and Agree with Plan of Care  Patient;Family member/caregiver    Family Member Consulted  Spouse        Patient will benefit from skilled  therapeutic intervention in order to improve the following deficits and impairments:  Abnormal gait, Pain, Decreased endurance, Decreased activity tolerance, Decreased strength, Obesity, Difficulty walking, Decreased balance, Decreased safety awareness, Increased edema, Impaired sensation, Decreased knowledge of use of DME  Visit Diagnosis: Muscle weakness (generalized)  Difficulty in walking, not elsewhere classified  Pain in right ankle and joints of right foot  Weakness of right leg  Pain in right hip     Problem List Patient Active Problem List   Diagnosis Date Noted  . Migraine without status migrainosus, not intractable   . Chronic pain syndrome   . Lumbar disc disease with radiculopathy 07/21/2017  . Lumbar foraminal stenosis 07/21/2017  . Unable to walk 07/21/2017  . Multiple falls 07/21/2017  . Displaced fracture of proximal end of right fibula 07/21/2017  . Lower extremity weakness 05/28/2017  . Right lower quadrant abdominal pain 08/22/2016  . Abnormal TSH 01/10/2016  . Morbid obesity ([place]) 01/12/2014  . Routine general medical examination at a health care facility 10/28/2012  . GERD (gastroesophageal reflux disease) 10/28/2012  . Anxiety 12/20/2011  . Allergic rhinitis 12/20/2011  . Asthma 12/20/2011  . Lapband APL with HH repair 03/31/2011  . Uncontrolled type 2 diabetes mellitus with hyperglycemia, with long-term current use of insulin ([place]) 06/30/2007  .  Hyperlipidemia 06/30/2007  . Essential hypertension 06/27/2007    Alanson Puls,  PT DPT 04/09/2018, 3:19 PM  [place] MAIN [facility] SERVICES [address] [place], [place], [address] Phone: [phone]   Fax:  [phone]  Name: [name] MRN: [id number] Date of Birth: 21-Aug-1965

## 2018-04-10 ENCOUNTER — Ambulatory Visit: Payer: BC Managed Care – PPO | Admitting: Physical Therapy

## 2018-04-11 ENCOUNTER — Encounter: Payer: Self-pay | Admitting: Physical Therapy

## 2018-04-11 ENCOUNTER — Ambulatory Visit: Payer: BC Managed Care – PPO | Admitting: Physical Therapy

## 2018-04-11 DIAGNOSIS — M6281 Muscle weakness (generalized): Secondary | ICD-10-CM | POA: Diagnosis not present

## 2018-04-11 DIAGNOSIS — R29898 Other symptoms and signs involving the musculoskeletal system: Secondary | ICD-10-CM

## 2018-04-11 DIAGNOSIS — R262 Difficulty in walking, not elsewhere classified: Secondary | ICD-10-CM

## 2018-04-11 DIAGNOSIS — M25571 Pain in right ankle and joints of right foot: Secondary | ICD-10-CM

## 2018-04-11 DIAGNOSIS — M25551 Pain in right hip: Secondary | ICD-10-CM

## 2018-04-11 NOTE — Therapy (Signed)
[place] MAIN [facility] SERVICES [address] [place], [place], [address] Phone: [phone]   Fax:  [phone]  Physical Therapy Treatment  Physical Therapy Progress Note   Dates of reporting period  03/07/18   to   04/11/18  Patient Details  Name: [name] MRN: [id number] Date of Birth: 26-Mar-1965 Referring Provider (PT): Pleas Koch   Encounter Date: 04/11/2018  PT End of Session - 04/11/18 1504    Visit Number  10    Number of Visits  17    Date for PT Re-Evaluation  05/02/18    PT Start Time  0315    PT Stop Time  0400    PT Time Calculation (min)  45 min    Equipment Utilized During Treatment  Gait belt    Activity Tolerance  Patient tolerated treatment well;Patient limited by fatigue    Behavior During Therapy  [facility] for tasks assessed/performed       Past Medical History:  Diagnosis Date  . Allergy   . Anemia   . Anxiety   . Arthritis   . Asthma   . Colon polyp   . Complication of anesthesia   . Diabetes mellitus   . Edema   . Fatigue   . Hypertension   . IBS (irritable bowel syndrome)   . Lump in female breast   . Methicillin resistant Staphylococcus aureus in conditions classified elsewhere and of unspecified site   . Migraines   . Morbid obesity ([place])   . Night sweats   . Nonspecific abnormal results of thyroid function study   . Other B-complex deficiencies   . Paresthesias   . PONV (postoperative nausea and vomiting)   . Pure hypercholesterolemia   . Reflux   . Sacral fracture ([place])   . Sinus problem   . Symptomatic states associated with artificial menopause   . Thyroid disease   . Type II or unspecified type diabetes mellitus without mention of complication, not stated as uncontrolled   . Unspecified sleep apnea     Past Surgical History:  Procedure Laterality Date  . abcess removal  1997   MRSA  . ABDOMINAL HYSTERECTOMY  1997   complete in 1997, partial was in 1995  . carpal tunnel  release r  07/03/13   Dalldorf  . [place]  . CHOLECYSTECTOMY  03/1989  . COLONOSCOPY  03/21/2011   Normal.  [facility].  [name] KNEE SURGERY  2006  . LAPAROSCOPIC GASTRIC BANDING  05/2009  . TONSILLECTOMY AND ADENOIDECTOMY  1974  . TUBAL LIGATION    . ULNAR TUNNEL RELEASE Right 02/05/2018   Procedure: CUBITAL TUNNEL RELEASE/DECOMPRESSION;  Surgeon: Melrose Nakayama, MD;  Location: [place];  Service: Orthopedics;  Laterality: Right;    There were no vitals filed for this visit.  Subjective Assessment - 04/11/18 1504    Subjective  Patient reports she is having no pain .[name] Reports that she is doing her HEP. [name]    Pertinent History   Patient fx her sacrum 9/18 and  she lost sensation to right foot, and she lost ability to DF her right foot. Patient had back surgery 03/2017 , she was sent home. She was walking with no device indoors and she could get out of the house and do the steps. June 18, 2017  her LLE was getting weaker and the right leg was getting stronger. Patient had follow up with neurosurgery. She was scheduled for a  steriod shot Jul 23, 2017, but on May 3 her legs didnt  feel right, she fell 4 times, she fractured her R fibula  on may 3rd, she went to hospital and spent one week. She went to SNF for 1 month. She had another neuro consult, resulting in kDx of  polyneuropathy. Then she had an apt with and [facility] said it was not polyneuropathy, (in [place])  She has a second consultation scheduled Dec 30 for a  Neuromuscular.consult.  She had a scan of the spine T 12-28-17  who then sent her to a thoracic surgeon at the end of November and was recommended to not due surgery. She does not know what her Dx is and she has been having home PT beginning August 25, 2017 . She has had HHPT 2 x  week for the last 5 months. She is able to walk 50 feet with RW. She is able to transfer from wc to stand independently.     How long can you stand comfortably?  5 mins    Patient Stated Goals  to walk  without the RW, or walk better, use the bathroom    Currently in Pain?  No/denies    Multiple Pain Sites  No          OUTCOME MEASURES: TEST Outcome Interpretation      10 meter walk test      .33          m/s <1.0 m/s indicates increased risk for falls; limited community ambulator  TUG 36.57 from wc <14 sec is normal  5 x sit to stand 20.58 from wc < 10 sec is normal           STRENGTH:  Graded on a 0-5 scale Muscle Group Left Right                          Hip Flex 1/5 3+/5  Hip Abd 3/5 3/5  Hip Add 1/5 1/5  Hip Ext 3/5 1/5  Hip IR/ER IR0/5 ,ER 3/5 3+//5  Knee Flex 3/5 0/5  Knee Ext 3+/5 5/5  Ankle DF -3/5 0/5  Ankle PF 3/5 0/5     GAIT: Ambulates with RW 30 feet with MI with wc following close behind, with R AFO, poor stepping quality and unsteady   SENSATION: RLE decreased sensation to right foot light touch/deep touch  LLE sensation is WNL  FUNCTIONAL MOBILITY: MI transfer sit to stand with 2 rails to push up with MI supine to sidelying with rails Supine to sit with rails independent Sit to supine with min assist for LLE up to the mat table Transfering from mat table: 19 inches without rails from mat level with struggle and extra time,  and assist to hold down the RW to stabilize                   PT Education - 04/11/18 1504    Education provided  Yes    Education Details  HEP    Person(s) Educated  Patient    Methods  Explanation    Comprehension  Verbalized understanding;Returned demonstration       PT Short Term Goals - 03/07/18 1345      PT SHORT TERM GOAL #1   Title  Patient will be independent in home exercise program to improve strength/mobility for better functional independence with ADLs.    Time  6    Period  Weeks  Status  New    Target Date  04/18/18        PT Long Term Goals - 04/11/18 1606      PT LONG TERM GOAL #1   Title  Patient will increase 10 meter walk test to >.50  m/s as to improve gait speed for better community ambulation and to reduce fall risk.    Time  8    Period  Weeks    Status  Partially Met    Target Date  05/02/18      PT LONG TERM GOAL #2   Title  Patient will transfer sit to stand  from 19 inch mat without assist or anyone supporting AD  to demonstrate improved LE strength to decrease falls risk.     Time  12    Period  Weeks    Status  Partially Met    Target Date  05/02/18      PT LONG TERM GOAL #3   Title  Patient will demonstrate stance without AD  x 1 min to demonstrate decreased falls risk.     Time  12    Period  Weeks    Status  Partially Met    Target Date  05/02/18      PT LONG TERM GOAL #4   Title  Patient will  ambulate 500 feet to work towards community ambulation distances.     Time  12    Period  Weeks    Status  Partially Met    Target Date  05/02/18            Plan - 04/11/18 1505    Clinical Impression Statement  Patient performs outcome measures and progressing towards her goals. She performs strengthening for core and LE's . Patient is transfering with increased stability and less assist needed to hold down the RW. She is improving her LE strenght and mobility and will conitnue to benefit from skilled PT to improve her quality of life.     Rehab Potential  Fair    Clinical Impairments Affecting Rehab Potential  Multiple tests concerning for neurologic involvement     PT Frequency  2x / week    PT Duration  12 weeks    PT Treatment/Interventions  Dry needling;Balance training;Neuromuscular re-education;Gait training;DME Instruction;Aquatic Therapy;Cryotherapy;Electrical Stimulation;Ultrasound;Moist Heat;Iontophoresis 14m/ml Dexamethasone;Stair training;Functional mobility training;Therapeutic activities;Therapeutic exercise;Orthotic Fit/Training;Patient/family education;Manual techniques;Wheelchair mobility training    PT Next Visit Plan  Progress quadricep/hamstring strengthening in [place] posterior  hip musculature strengthening as tolerated.     PT Home Exercise Plan  need to do next visit as she was late to eval apt , [place] with Plan of Care  Patient;Family member/caregiver    Family Member Consulted  Spouse        Patient will benefit from skilled therapeutic intervention in order to improve the following deficits and impairments:  Abnormal gait, Pain, Decreased endurance, Decreased activity tolerance, Decreased strength, Obesity, Difficulty walking, Decreased balance, Decreased safety awareness, Increased edema, Impaired sensation, Decreased knowledge of use of DME  Visit Diagnosis: Muscle weakness (generalized)  Difficulty in walking, not elsewhere classified  Pain in right ankle and joints of right foot  Weakness of right leg  Pain in right hip     Problem List Patient Active Problem List   Diagnosis Date Noted  . Migraine without status migrainosus, not intractable   . Chronic pain syndrome   . Lumbar disc disease with radiculopathy  07/21/2017  . Lumbar foraminal stenosis 07/21/2017  . Unable to walk 07/21/2017  . Multiple falls 07/21/2017  . Displaced fracture of proximal end of right fibula 07/21/2017  . Lower extremity weakness 05/28/2017  . Right lower quadrant abdominal pain 08/22/2016  . Abnormal TSH 01/10/2016  . Morbid obesity ([place]) 01/12/2014  . Routine general medical examination at a health care facility 10/28/2012  . GERD (gastroesophageal reflux disease) 10/28/2012  . Anxiety 12/20/2011  . Allergic rhinitis 12/20/2011  . Asthma 12/20/2011  . Lapband APL with HH repair 03/31/2011  . Uncontrolled type 2 diabetes mellitus with hyperglycemia, with long-term current use of insulin ([place]) 06/30/2007  . Hyperlipidemia 06/30/2007  . Essential hypertension 06/27/2007    Alanson Puls, PT DPT 04/11/2018, 4:07 PM  [place] MAIN [facility] SERVICES [address]  [place], [place], [address] Phone: [phone]   Fax:  [phone]  Name: ADALIZ DOBIS MRN: [id number] Date of Birth: October 10, 1965

## 2018-04-16 ENCOUNTER — Ambulatory Visit: Payer: BC Managed Care – PPO | Admitting: Physical Therapy

## 2018-04-23 ENCOUNTER — Ambulatory Visit: Payer: BC Managed Care – PPO | Admitting: Physical Therapy

## 2018-04-25 ENCOUNTER — Ambulatory Visit: Payer: BC Managed Care – PPO | Admitting: Physical Therapy

## 2018-04-29 ENCOUNTER — Encounter: Payer: BC Managed Care – PPO | Admitting: Occupational Therapy

## 2018-04-30 ENCOUNTER — Ambulatory Visit: Payer: BC Managed Care – PPO | Attending: Primary Care | Admitting: Physical Therapy

## 2018-04-30 ENCOUNTER — Encounter: Payer: Self-pay | Admitting: Physical Therapy

## 2018-04-30 DIAGNOSIS — R262 Difficulty in walking, not elsewhere classified: Secondary | ICD-10-CM | POA: Diagnosis present

## 2018-04-30 DIAGNOSIS — M25571 Pain in right ankle and joints of right foot: Secondary | ICD-10-CM | POA: Diagnosis present

## 2018-04-30 DIAGNOSIS — R29898 Other symptoms and signs involving the musculoskeletal system: Secondary | ICD-10-CM | POA: Diagnosis present

## 2018-04-30 DIAGNOSIS — M6281 Muscle weakness (generalized): Secondary | ICD-10-CM

## 2018-04-30 DIAGNOSIS — M25551 Pain in right hip: Secondary | ICD-10-CM

## 2018-04-30 NOTE — Therapy (Signed)
[place] MAIN [facility] SERVICES [address] [place], [place], [address] Phone: [phone]   Fax:  [phone]  Physical Therapy Treatment  Patient Details  Name: [name] MRN: [id number] Date of Birth: Dec 02, 1965 Referring Provider (PT): Pleas Koch   Encounter Date: 04/30/2018  PT End of Session - 04/30/18 1539    Visit Number  11    Number of Visits  17    Date for PT Re-Evaluation  05/02/18    PT Start Time  0315    PT Stop Time  0400    PT Time Calculation (min)  45 min    Equipment Utilized During Treatment  Gait belt    Activity Tolerance  Patient tolerated treatment well;Patient limited by fatigue    Behavior During Therapy  [facility] for tasks assessed/performed       Past Medical History:  Diagnosis Date  . Allergy   . Anemia   . Anxiety   . Arthritis   . Asthma   . Colon polyp   . Complication of anesthesia   . Diabetes mellitus   . Edema   . Fatigue   . Hypertension   . IBS (irritable bowel syndrome)   . Lump in female breast   . Methicillin resistant Staphylococcus aureus in conditions classified elsewhere and of unspecified site   . Migraines   . Morbid obesity ([place])   . Night sweats   . Nonspecific abnormal results of thyroid function study   . Other B-complex deficiencies   . Paresthesias   . PONV (postoperative nausea and vomiting)   . Pure hypercholesterolemia   . Reflux   . Sacral fracture ([place])   . Sinus problem   . Symptomatic states associated with artificial menopause   . Thyroid disease   . Type II or unspecified type diabetes mellitus without mention of complication, not stated as uncontrolled   . Unspecified sleep apnea     Past Surgical History:  Procedure Laterality Date  . abcess removal  1997   MRSA  . ABDOMINAL HYSTERECTOMY  1997   complete in 1997, partial was in 1995  . carpal tunnel release r  07/03/13   Dalldorf  . [place]  . CHOLECYSTECTOMY   03/1989  . COLONOSCOPY  03/21/2011   Normal.  [facility].  [name] KNEE SURGERY  2006  . LAPAROSCOPIC GASTRIC BANDING  05/2009  . TONSILLECTOMY AND ADENOIDECTOMY  1974  . TUBAL LIGATION    . ULNAR TUNNEL RELEASE Right 02/05/2018   Procedure: CUBITAL TUNNEL RELEASE/DECOMPRESSION;  Surgeon: Melrose Nakayama, MD;  Location: [place];  Service: Orthopedics;  Laterality: Right;    There were no vitals filed for this visit.  Subjective Assessment - 04/30/18 1517    Subjective  Patient reports she is having no pain .[name] Reports that she is doing her HEP. [name]    Pertinent History   Patient fx her sacrum 9/18 and  she lost sensation to right foot, and she lost ability to DF her right foot. Patient had back surgery 03/2017 , she was sent home. She was walking with no device indoors and she could get out of the house and do the steps. June 18, 2017  her LLE was getting weaker and the right leg was getting stronger. Patient had follow up with neurosurgery. She was scheduled for a steriod shot Jul 23, 2017, but on May 3 her legs didnt  feel right, she fell 4 times,  she fractured her R fibula  on may 3rd, she went to hospital and spent one week. She went to SNF for 1 month. She had another neuro consult, resulting in kDx of  polyneuropathy. Then she had an apt with and [facility] said it was not polyneuropathy, (in [place])  She has a second consultation scheduled Dec 30 for a  Neuromuscular.consult.  She had a scan of the spine T 12-28-17  who then sent her to a thoracic surgeon at the end of November and was recommended to not due surgery. She does not know what her Dx is and she has been having home PT beginning August 25, 2017 . She has had HHPT 2 x  week for the last 5 months. She is able to walk 50 feet with RW. She is able to transfer from wc to stand independently.     How long can you stand comfortably?  5 mins    Patient Stated Goals  to walk without the RW, or walk better, use the bathroom    Currently in Pain?  Yes    Pain  Score  1     Pain Location  Foot    Pain Orientation  Right    Pain Descriptors / Indicators  Aching    Pain Type  Chronic pain    Multiple Pain Sites  No         Treatment:  Gait training with RW on firm surfaces and carpet 216  feet with wc followingand B ankle AFO, with wide BOS and heavy weight bearing on RW for additional support Transfer training sit to standin parallel barsx 10 reps, with last several inches uncontrolled descent  Standing hip abd x 10 x 2 BLE Standing hip extension x 10 x 2 Standing knee flex x 10 x 2  Standing marching x 10 x 2 sets  CGA and Min  verbal cues used throughout with increased in postural sway and LOB most seen with narrow base of support and while on uneven surfaces. Continues to have balance deficits typical with diagnosis. Patient performs intermediate level exercises without pain behaviors and needs verbal cuing for postural alignment and head positioning                          PT Short Term Goals - 03/07/18 1345      PT SHORT TERM GOAL #1   Title  Patient will be independent in home exercise program to improve strength/mobility for better functional independence with ADLs.    Time  6    Period  Weeks    Status  New    Target Date  04/18/18        PT Long Term Goals - 04/11/18 1606      PT LONG TERM GOAL #1   Title  Patient will increase 10 meter walk test to >.50 m/s as to improve gait speed for better community ambulation and to reduce fall risk.    Time  8    Period  Weeks    Status  Partially Met    Target Date  05/02/18      PT LONG TERM GOAL #2   Title  Patient will transfer sit to stand  from 19 inch mat without assist or anyone supporting AD  to demonstrate improved LE strength to decrease falls risk.     Time  12    Period  Weeks    Status  Partially Met  Target Date  05/02/18      PT LONG TERM GOAL #3   Title  Patient will demonstrate stance without AD  x 1 min to demonstrate decreased  falls risk.     Time  12    Period  Weeks    Status  Partially Met    Target Date  05/02/18      PT LONG TERM GOAL #4   Title  Patient will  ambulate 500 feet to work towards community ambulation distances.     Time  12    Period  Weeks    Status  Partially Met    Target Date  05/02/18            Plan - 04/30/18 1540    Clinical Impression Statement  Patient   instructed in gait training on level surfaces on level surface with rest periods standing. Patient is able to perform intermediate LE exercises in sitting and standing open and closed chain.  Patient performs dynamic standing balance training with uneven surfaces with CGA. Patient will continue to benefit from skilled PT to improve strength, balance and gait. Patient continues to have LE weakness   and decreased static and dynamic standing balance.    Rehab Potential  Fair    Clinical Impairments Affecting Rehab Potential  Multiple tests concerning for neurologic involvement     PT Frequency  2x / week    PT Duration  12 weeks    PT Treatment/Interventions  Dry needling;Balance training;Neuromuscular re-education;Gait training;DME Instruction;Aquatic Therapy;Cryotherapy;Electrical Stimulation;Ultrasound;Moist Heat;Iontophoresis '4mg'$ /ml Dexamethasone;Stair training;Functional mobility training;Therapeutic activities;Therapeutic exercise;Orthotic Fit/Training;Patient/family education;Manual techniques;Wheelchair mobility training    PT Next Visit Plan  Progress quadricep/hamstring strengthening in [place], posterior hip musculature strengthening as tolerated.     PT Home Exercise Plan  need to do next visit as she was late to eval apt , she has HEP from [place] and Agree with Plan of Care  Patient;Family member/caregiver    Family Member Consulted  Spouse        Patient will benefit from skilled therapeutic intervention in order to improve the following deficits and impairments:  Abnormal gait, Pain, Decreased endurance,  Decreased activity tolerance, Decreased strength, Obesity, Difficulty walking, Decreased balance, Decreased safety awareness, Increased edema, Impaired sensation, Decreased knowledge of use of DME  Visit Diagnosis: Muscle weakness (generalized)  Difficulty in walking, not elsewhere classified  Pain in right ankle and joints of right foot  Weakness of right leg  Pain in right hip     Problem List Patient Active Problem List   Diagnosis Date Noted  . Migraine without status migrainosus, not intractable   . Chronic pain syndrome   . Lumbar disc disease with radiculopathy 07/21/2017  . Lumbar foraminal stenosis 07/21/2017  . Unable to walk 07/21/2017  . Multiple falls 07/21/2017  . Displaced fracture of proximal end of right fibula 07/21/2017  . Lower extremity weakness 05/28/2017  . Right lower quadrant abdominal pain 08/22/2016  . Abnormal TSH 01/10/2016  . Morbid obesity ([place]) 01/12/2014  . Routine general medical examination at a health care facility 10/28/2012  . GERD (gastroesophageal reflux disease) 10/28/2012  . Anxiety 12/20/2011  . Allergic rhinitis 12/20/2011  . Asthma 12/20/2011  . Lapband APL with HH repair 03/31/2011  . Uncontrolled type 2 diabetes mellitus with hyperglycemia, with long-term current use of insulin ([place]) 06/30/2007  . Hyperlipidemia 06/30/2007  . Essential hypertension 06/27/2007    Alanson Puls, PT DPT 04/30/2018, 3:49 PM  [facility]  [place] MAIN [facility] SERVICES [address] [place], [place], [address] Phone: [phone]   Fax:  [phone]  Name: BICH MCHANEY MRN: [id number] Date of Birth: 01/09/1966

## 2018-05-02 ENCOUNTER — Encounter: Payer: Self-pay | Admitting: Physical Therapy

## 2018-05-02 ENCOUNTER — Ambulatory Visit: Payer: BC Managed Care – PPO | Admitting: Physical Therapy

## 2018-05-02 DIAGNOSIS — M6281 Muscle weakness (generalized): Secondary | ICD-10-CM

## 2018-05-02 DIAGNOSIS — M25571 Pain in right ankle and joints of right foot: Secondary | ICD-10-CM

## 2018-05-02 DIAGNOSIS — M25551 Pain in right hip: Secondary | ICD-10-CM

## 2018-05-02 DIAGNOSIS — R29898 Other symptoms and signs involving the musculoskeletal system: Secondary | ICD-10-CM

## 2018-05-02 DIAGNOSIS — R262 Difficulty in walking, not elsewhere classified: Secondary | ICD-10-CM

## 2018-05-02 DIAGNOSIS — E7800 Pure hypercholesterolemia, unspecified: Secondary | ICD-10-CM

## 2018-05-02 DIAGNOSIS — I10 Essential (primary) hypertension: Secondary | ICD-10-CM

## 2018-05-02 MED ORDER — LOSARTAN POTASSIUM 100 MG PO TABS: 50.0000 mg | ORAL_TABLET | Freq: Every day | ORAL | 1 refills | Status: DC

## 2018-05-02 MED ORDER — ATORVASTATIN CALCIUM 40 MG PO TABS: 40.0000 mg | ORAL_TABLET | Freq: Every day | ORAL | 0 refills | Status: DC

## 2018-05-02 NOTE — Therapy (Signed)
[place] MAIN [facility] SERVICES [address] [place], [place], [address] Phone: [phone]   Fax:  [phone]  Physical Therapy Treatment  Patient Details  Name: [name] MRN: [id number] Date of Birth: 1965/05/29 Referring Provider (PT): Pleas Koch   Encounter Date: 05/02/2018  PT End of Session - 05/02/18 1438    Visit Number  12    Number of Visits  17    Date for PT Re-Evaluation  05/02/18    PT Start Time  0145    PT Stop Time  0230    PT Time Calculation (min)  45 min    Equipment Utilized During Treatment  Gait belt    Activity Tolerance  Patient tolerated treatment well;Patient limited by fatigue    Behavior During Therapy  WFL for tasks assessed/performed       Past Medical History:  Diagnosis Date  . Allergy   . Anemia   . Anxiety   . Arthritis   . Asthma   . Colon polyp   . Complication of anesthesia   . Diabetes mellitus   . Edema   . Fatigue   . Hypertension   . IBS (irritable bowel syndrome)   . Lump in female breast   . Methicillin resistant Staphylococcus aureus in conditions classified elsewhere and of unspecified site   . Migraines   . Morbid obesity ([place])   . Night sweats   . Nonspecific abnormal results of thyroid function study   . Other B-complex deficiencies   . Paresthesias   . PONV (postoperative nausea and vomiting)   . Pure hypercholesterolemia   . Reflux   . Sacral fracture ([place])   . Sinus problem   . Symptomatic states associated with artificial menopause   . Thyroid disease   . Type II or unspecified type diabetes mellitus without mention of complication, not stated as uncontrolled   . Unspecified sleep apnea     Past Surgical History:  Procedure Laterality Date  . abcess removal  1997   MRSA  . ABDOMINAL HYSTERECTOMY  1997   complete in 1997, partial was in 1995  . carpal tunnel release r  07/03/13   Dalldorf  . [place]  . CHOLECYSTECTOMY   03/1989  . COLONOSCOPY  03/21/2011   Normal.  [facility].  [name] KNEE SURGERY  2006  . LAPAROSCOPIC GASTRIC BANDING  05/2009  . TONSILLECTOMY AND ADENOIDECTOMY  1974  . TUBAL LIGATION    . ULNAR TUNNEL RELEASE Right 02/05/2018   Procedure: CUBITAL TUNNEL RELEASE/DECOMPRESSION;  Surgeon: Melrose Nakayama, MD;  Location: [place];  Service: Orthopedics;  Laterality: Right;    There were no vitals filed for this visit.  Subjective Assessment - 05/02/18 1438    Subjective  Patient reports she is having no pain .[name] Reports that she is doing her HEP. [name]    Pertinent History   Patient fx her sacrum 9/18 and  she lost sensation to right foot, and she lost ability to DF her right foot. Patient had back surgery 03/2017 , she was sent home. She was walking with no device indoors and she could get out of the house and do the steps. June 18, 2017  her LLE was getting weaker and the right leg was getting stronger. Patient had follow up with neurosurgery. She was scheduled for a steriod shot Jul 23, 2017, but on May 3 her legs didnt  feel right, she fell 4 times,  she fractured her R fibula  on may 3rd, she went to hospital and spent one week. She went to SNF for 1 month. She had another neuro consult, resulting in kDx of  polyneuropathy. Then she had an apt with and [facility] said it was not polyneuropathy, (in [place])  She has a second consultation scheduled Dec 30 for a  Neuromuscular.consult.  She had a scan of the spine T 12-28-17  who then sent her to a thoracic surgeon at the end of November and was recommended to not due surgery. She does not know what her Dx is and she has been having home PT beginning August 25, 2017 . She has had HHPT 2 x  week for the last 5 months. She is able to walk 50 feet with RW. She is able to transfer from wc to stand independently.     How long can you stand comfortably?  5 mins    Patient Stated Goals  to walk without the RW, or walk better, use the bathroom    Currently in Pain?  No/denies     Pain Score  0-No pain    Multiple Pain Sites  No        Treatment: TM walking . 2 m/sec x 1 min, . 3 miles/ hour x 2 mins, x 2 sets   Stepping up 7 inch step with mod assist x 1 rep Descending down 3 inch step bwd with min assist x 1 rep Transfer training from wc to a chair with no arms x 3 reps and mod assist and use of R loftstrand crutch  Patient has mild anxiety with new activities, no reports of pain and has fatigue after treatment. Patient needs VC and TC for sequencing and safety and correct technique.                       PT Education - 05/02/18 1438    Education provided  Yes    Education Details  HEP    Person(s) Educated  Patient    Methods  Explanation;Demonstration    Comprehension  Returned demonstration;Need further instruction;Verbalized understanding       PT Short Term Goals - 03/07/18 1345      PT SHORT TERM GOAL #1   Title  Patient will be independent in home exercise program to improve strength/mobility for better functional independence with ADLs.    Time  6    Period  Weeks    Status  New    Target Date  04/18/18        PT Long Term Goals - 04/11/18 1606      PT LONG TERM GOAL #1   Title  Patient will increase 10 meter walk test to >.50 m/s as to improve gait speed for better community ambulation and to reduce fall risk.    Time  8    Period  Weeks    Status  Partially Met    Target Date  05/02/18      PT LONG TERM GOAL #2   Title  Patient will transfer sit to stand  from 19 inch mat without assist or anyone supporting AD  to demonstrate improved LE strength to decrease falls risk.     Time  12    Period  Weeks    Status  Partially Met    Target Date  05/02/18      PT LONG TERM GOAL #3   Title  Patient will demonstrate  stance without AD  x 1 min to demonstrate decreased falls risk.     Time  12    Period  Weeks    Status  Partially Met    Target Date  05/02/18      PT LONG TERM GOAL #4   Title  Patient will   ambulate 500 feet to work towards community ambulation distances.     Time  12    Period  Weeks    Status  Partially Met    Target Date  05/02/18            Plan - 05/02/18 1438    Clinical Impression Statement  Patient instructed in gait training on TM at . 3 m/sec, stepping up 7 inch step and down 3 inch step backwards. she was instructed in sit to stand transfers with loftstrand crutch RUE and use of wc arm for pushing up. She will continue to benefit from skilled PT to improve mobiltiy and safety and strength.     Rehab Potential  Fair    Clinical Impairments Affecting Rehab Potential  Multiple tests concerning for neurologic involvement     PT Frequency  2x / week    PT Duration  12 weeks    PT Treatment/Interventions  Dry needling;Balance training;Neuromuscular re-education;Gait training;DME Instruction;Aquatic Therapy;Cryotherapy;Electrical Stimulation;Ultrasound;Moist Heat;Iontophoresis 45m/ml Dexamethasone;Stair training;Functional mobility training;Therapeutic activities;Therapeutic exercise;Orthotic Fit/Training;Patient/family education;Manual techniques;Wheelchair mobility training    PT Next Visit Plan  Progress quadricep/hamstring strengthening in [place] posterior hip musculature strengthening as tolerated.     PT Home Exercise Plan  need to do next visit as she was late to eval apt , [place]  Patient;Family member/caregiver    Family Member Consulted  Spouse        Patient will benefit from skilled therapeutic intervention in order to improve the following deficits and impairments:  Abnormal gait, Pain, Decreased endurance, Decreased activity tolerance, Decreased strength, Obesity, Difficulty walking, Decreased balance, Decreased safety awareness, Increased edema, Impaired sensation, Decreased knowledge of use of DME  Visit Diagnosis: Muscle weakness (generalized)  Difficulty in walking, not elsewhere  classified  Pain in right ankle and joints of right foot  Weakness of right leg  Pain in right hip     Problem List Patient Active Problem List   Diagnosis Date Noted  . Migraine without status migrainosus, not intractable   . Chronic pain syndrome   . Lumbar disc disease with radiculopathy 07/21/2017  . Lumbar foraminal stenosis 07/21/2017  . Unable to walk 07/21/2017  . Multiple falls 07/21/2017  . Displaced fracture of proximal end of right fibula 07/21/2017  . Lower extremity weakness 05/28/2017  . Right lower quadrant abdominal pain 08/22/2016  . Abnormal TSH 01/10/2016  . Morbid obesity ([place] 01/12/2014  . Routine general medical examination at a health care facility 10/28/2012  . GERD (gastroesophageal reflux disease) 10/28/2012  . Anxiety 12/20/2011  . Allergic rhinitis 12/20/2011  . Asthma 12/20/2011  . Lapband APL with HH repair 03/31/2011  . Uncontrolled type 2 diabetes mellitus with hyperglycemia, with long-term current use of insulin ([place] 06/30/2007  . Hyperlipidemia 06/30/2007  . Essential hypertension 06/27/2007    MAlanson Puls PVirginiaDPT 05/02/2018, 2:41 PM  [facility] [address] [place] [address]Phone: [phone]  Fax:  [phone] Name: Maria PECKINPAUGHMRN: [id number] of Birth: [birth year]

## 2018-05-07 ENCOUNTER — Ambulatory Visit: Payer: BC Managed Care – PPO | Admitting: Physical Therapy

## 2018-05-07 ENCOUNTER — Encounter: Payer: Self-pay | Admitting: Physical Therapy

## 2018-05-08 ENCOUNTER — Ambulatory Visit: Payer: BC Managed Care – PPO | Admitting: Primary Care

## 2018-05-09 ENCOUNTER — Encounter: Payer: Self-pay | Admitting: Physical Therapy

## 2018-05-09 ENCOUNTER — Ambulatory Visit: Payer: BC Managed Care – PPO | Admitting: Physical Therapy

## 2018-05-09 DIAGNOSIS — R29898 Other symptoms and signs involving the musculoskeletal system: Secondary | ICD-10-CM

## 2018-05-09 DIAGNOSIS — M6281 Muscle weakness (generalized): Secondary | ICD-10-CM

## 2018-05-09 DIAGNOSIS — M25571 Pain in right ankle and joints of right foot: Secondary | ICD-10-CM

## 2018-05-09 DIAGNOSIS — R262 Difficulty in walking, not elsewhere classified: Secondary | ICD-10-CM

## 2018-05-09 DIAGNOSIS — M25551 Pain in right hip: Secondary | ICD-10-CM

## 2018-05-09 NOTE — Therapy (Signed)
[place] MAIN [facility] SERVICES [address] [place], [place], [address] Phone: [phone]   Fax:  [phone]  Physical Therapy Treatment  Patient Details  Name: [name] MRN: [id number] Date of Birth: 1965/07/13 Referring Provider (PT): Pleas Koch   Encounter Date: 05/09/2018  PT End of Session - 05/09/18 1409    Visit Number  13    Number of Visits  17    Date for PT Re-Evaluation  05/02/18    PT Start Time  0150    PT Stop Time  0230    PT Time Calculation (min)  40 min    Equipment Utilized During Treatment  Gait belt    Activity Tolerance  Patient tolerated treatment well;Patient limited by fatigue    Behavior During Therapy  WFL for tasks assessed/performed       Past Medical History:  Diagnosis Date  . Allergy   . Anemia   . Anxiety   . Arthritis   . Asthma   . Colon polyp   . Complication of anesthesia   . Diabetes mellitus   . Edema   . Fatigue   . Hypertension   . IBS (irritable bowel syndrome)   . Lump in female breast   . Methicillin resistant Staphylococcus aureus in conditions classified elsewhere and of unspecified site   . Migraines   . Morbid obesity ([place])   . Night sweats   . Nonspecific abnormal results of thyroid function study   . Other B-complex deficiencies   . Paresthesias   . PONV (postoperative nausea and vomiting)   . Pure hypercholesterolemia   . Reflux   . Sacral fracture ([place])   . Sinus problem   . Symptomatic states associated with artificial menopause   . Thyroid disease   . Type II or unspecified type diabetes mellitus without mention of complication, not stated as uncontrolled   . Unspecified sleep apnea     Past Surgical History:  Procedure Laterality Date  . abcess removal  1997   MRSA  . ABDOMINAL HYSTERECTOMY  1997   complete in 1997, partial was in 1995  . carpal tunnel release r  07/03/13   Dalldorf  . [place]  . CHOLECYSTECTOMY   03/1989  . COLONOSCOPY  03/21/2011   Normal.  [facility].  [name] KNEE SURGERY  2006  . LAPAROSCOPIC GASTRIC BANDING  05/2009  . TONSILLECTOMY AND ADENOIDECTOMY  1974  . TUBAL LIGATION    . ULNAR TUNNEL RELEASE Right 02/05/2018   Procedure: CUBITAL TUNNEL RELEASE/DECOMPRESSION;  Surgeon: Melrose Nakayama, MD;  Location: [facility];  Service: Orthopedics;  Laterality: Right;    There were no vitals filed for this visit.  Subjective Assessment - 05/09/18 1408    Subjective  Patient reports she was sick for 24 hours and her husband and mom also had it.     Pertinent History   Patient fx her sacrum 9/18 and  she lost sensation to right foot, and she lost ability to DF her right foot. Patient had back surgery 03/2017 , she was sent home. She was walking with no device indoors and she could get out of the house and do the steps. June 18, 2017  her LLE was getting weaker and the right leg was getting stronger. Patient had follow up with neurosurgery. She was scheduled for a steriod shot Jul 23, 2017, but on May 3 her legs didnt  feel right, she fell 4  times, she fractured her R fibula  on may 3rd, she went to hospital and spent one week. She went to SNF for 1 month. She had another neuro consult, resulting in kDx of  polyneuropathy. Then she had an apt with and [facility] said it was not polyneuropathy, (in [place])  She has a second consultation scheduled Dec 30 for a  Neuromuscular.consult.  She had a scan of the spine T 12-28-17  who then sent her to a thoracic surgeon at the end of November and was recommended to not due surgery. She does not know what her Dx is and she has been having home PT beginning August 25, 2017 . She has had HHPT 2 x  week for the last 5 months. She is able to walk 50 feet with RW. She is able to transfer from wc to stand independently.     How long can you stand comfortably?  5 mins    Patient Stated Goals  to walk without the RW, or walk better, use the bathroom    Currently in Pain?  No/denies     Pain Score  0-No pain       Treatment:   Standing hip abd x 15 x 2 BLE Standing hip extension x 15 x 2 Standing knee flex x 15 x 2  Standing marching x 15 x 2 sets Knee flex on pilowcase with RTB x 15 x 3 sets   Sit to stand x 5 in parallel bars  CGA and Min  verbal cues used throughout with increased in postural sway and LOB most seen with narrow base of support and while on uneven surfaces. Continues to have balance deficits typical with diagnosis. Patient performs intermediate level exercises without pain behaviors and needs verbal cuing for postural alignment and head positioning                        PT Education - 05/09/18 1409    Education provided  Yes    Education Details  HEP    Person(s) Educated  Patient    Methods  Explanation    Comprehension  Returned demonstration;Need further instruction;Verbalized understanding       PT Short Term Goals - 03/07/18 1345      PT SHORT TERM GOAL #1   Title  Patient will be independent in home exercise program to improve strength/mobility for better functional independence with ADLs.    Time  6    Period  Weeks    Status  New    Target Date  04/18/18        PT Long Term Goals - 04/11/18 1606      PT LONG TERM GOAL #1   Title  Patient will increase 10 meter walk test to >.50 m/s as to improve gait speed for better community ambulation and to reduce fall risk.    Time  8    Period  Weeks    Status  Partially Met    Target Date  05/02/18      PT LONG TERM GOAL #2   Title  Patient will transfer sit to stand  from 19 inch mat without assist or anyone supporting AD  to demonstrate improved LE strength to decrease falls risk.     Time  12    Period  Weeks    Status  Partially Met    Target Date  05/02/18      PT LONG TERM GOAL #3  Title  Patient will demonstrate stance without AD  x 1 min to demonstrate decreased falls risk.     Time  12    Period  Weeks    Status  Partially Met    Target Date   05/02/18      PT LONG TERM GOAL #4   Title  Patient will  ambulate 500 feet to work towards community ambulation distances.     Time  12    Period  Weeks    Status  Partially Met    Target Date  05/02/18            Plan - 05/09/18 1411    Clinical Impression Statement  Pt requires direction and verbal cues for correct performance of exercises. Patient demonstrates weakness in BLE and performs open and closed chain exercises with no reports of pain. Pt was able to perform all exercises with mod assist and VC for technique. Patient struggles with speed during movement as well as balance with unstable surfaces.  Pt encouraged continuing new supine HEP .Follow-up as scheduled.    Rehab Potential  Fair    Clinical Impairments Affecting Rehab Potential  Multiple tests concerning for neurologic involvement     PT Frequency  2x / week    PT Duration  12 weeks    PT Treatment/Interventions  Dry needling;Balance training;Neuromuscular re-education;Gait training;DME Instruction;Aquatic Therapy;Cryotherapy;Electrical Stimulation;Ultrasound;Moist Heat;Iontophoresis '4mg'$ /ml Dexamethasone;Stair training;Functional mobility training;Therapeutic activities;Therapeutic exercise;Orthotic Fit/Training;Patient/family education;Manual techniques;Wheelchair mobility training    PT Next Visit Plan  Progress quadricep/hamstring strengthening in [place], posterior hip musculature strengthening as tolerated.     PT Home Exercise Plan  need to do next visit as she was late to eval apt , she has HEP from [place] and Agree with Plan of Care  Patient;Family member/caregiver    Family Member Consulted  Spouse        Patient will benefit from skilled therapeutic intervention in order to improve the following deficits and impairments:  Abnormal gait, Pain, Decreased endurance, Decreased activity tolerance, Decreased strength, Obesity, Difficulty walking, Decreased balance, Decreased safety awareness, Increased  edema, Impaired sensation, Decreased knowledge of use of DME  Visit Diagnosis: Muscle weakness (generalized)  Difficulty in walking, not elsewhere classified  Pain in right ankle and joints of right foot  Weakness of right leg  Pain in right hip     Problem List Patient Active Problem List   Diagnosis Date Noted  . Migraine without status migrainosus, not intractable   . Chronic pain syndrome   . Lumbar disc disease with radiculopathy 07/21/2017  . Lumbar foraminal stenosis 07/21/2017  . Unable to walk 07/21/2017  . Multiple falls 07/21/2017  . Displaced fracture of proximal end of right fibula 07/21/2017  . Lower extremity weakness 05/28/2017  . Right lower quadrant abdominal pain 08/22/2016  . Abnormal TSH 01/10/2016  . Morbid obesity ([place]) 01/12/2014  . Routine general medical examination at a health care facility 10/28/2012  . GERD (gastroesophageal reflux disease) 10/28/2012  . Anxiety 12/20/2011  . Allergic rhinitis 12/20/2011  . Asthma 12/20/2011  . Lapband APL with HH repair 03/31/2011  . Uncontrolled type 2 diabetes mellitus with hyperglycemia, with long-term current use of insulin ([place]) 06/30/2007  . Hyperlipidemia 06/30/2007  . Essential hypertension 06/27/2007    Alanson Puls, PT DPT 05/09/2018, 2:17 PM  [place] MAIN [facility] SERVICES [address] [place], [place], [address] Phone: [phone]   Fax:  [phone]  Name: Augustine  BARRETT HOLTHAUS MRN: [id number] Date of Birth: 1965-08-18

## 2018-05-14 ENCOUNTER — Ambulatory Visit: Payer: BC Managed Care – PPO | Admitting: Physical Therapy

## 2018-05-14 ENCOUNTER — Encounter: Payer: Self-pay | Admitting: Physical Therapy

## 2018-05-14 DIAGNOSIS — M6281 Muscle weakness (generalized): Secondary | ICD-10-CM

## 2018-05-14 DIAGNOSIS — R262 Difficulty in walking, not elsewhere classified: Secondary | ICD-10-CM

## 2018-05-14 DIAGNOSIS — R29898 Other symptoms and signs involving the musculoskeletal system: Secondary | ICD-10-CM

## 2018-05-14 DIAGNOSIS — M25571 Pain in right ankle and joints of right foot: Secondary | ICD-10-CM

## 2018-05-14 DIAGNOSIS — M25551 Pain in right hip: Secondary | ICD-10-CM

## 2018-05-14 NOTE — Addendum Note (Signed)
Addended by: Alanson Puls on: 05/14/2018 03:52 PM   Modules accepted: Orders

## 2018-05-14 NOTE — Therapy (Signed)
[place] MAIN [facility] SERVICES [address] [place], [place], [address] Phone: [phone]   Fax:  [phone]  Physical Therapy Treatment  Patient Details  Name: [name] MRN: [id number] Date of Birth: Nov 19, 1965 Referring Provider (PT): Pleas Koch   Encounter Date: 05/14/2018  PT End of Session - 05/14/18 1546    Visit Number  14    Number of Visits  17    Date for PT Re-Evaluation  07/25/18    PT Start Time  0145    PT Stop Time  0230    PT Time Calculation (min)  45 min    Equipment Utilized During Treatment  Gait belt    Activity Tolerance  Patient tolerated treatment well;Patient limited by fatigue    Behavior During Therapy  WFL for tasks assessed/performed       Past Medical History:  Diagnosis Date  . Allergy   . Anemia   . Anxiety   . Arthritis   . Asthma   . Colon polyp   . Complication of anesthesia   . Diabetes mellitus   . Edema   . Fatigue   . Hypertension   . IBS (irritable bowel syndrome)   . Lump in female breast   . Methicillin resistant Staphylococcus aureus in conditions classified elsewhere and of unspecified site   . Migraines   . Morbid obesity ([employer])   . Night sweats   . Nonspecific abnormal results of thyroid function study   . Other B-complex deficiencies   . Paresthesias   . PONV (postoperative nausea and vomiting)   . Pure hypercholesterolemia   . Reflux   . Sacral fracture ([place])   . Sinus problem   . Symptomatic states associated with artificial menopause   . Thyroid disease   . Type II or unspecified type diabetes mellitus without mention of complication, not stated as uncontrolled   . Unspecified sleep apnea     Past Surgical History:  Procedure Laterality Date  . abcess removal  1997   MRSA  . ABDOMINAL HYSTERECTOMY  1997   complete in 1997, partial was in 1995  . carpal tunnel release r  07/03/13   Dalldorf  . [place]  . CHOLECYSTECTOMY   03/1989  . COLONOSCOPY  03/21/2011   Normal.  [facility].  [name] KNEE SURGERY  2006  . LAPAROSCOPIC GASTRIC BANDING  05/2009  . TONSILLECTOMY AND ADENOIDECTOMY  1974  . TUBAL LIGATION    . ULNAR TUNNEL RELEASE Right 02/05/2018   Procedure: CUBITAL TUNNEL RELEASE/DECOMPRESSION;  Surgeon: Melrose Nakayama, MD;  Location: [place];  Service: Orthopedics;  Laterality: Right;    There were no vitals filed for this visit.  Subjective Assessment - 05/14/18 1541    Subjective  Patient reports she was able to leave her wc in her living room and she is now walking to use the bathroom.     Patient is accompained by:  Family member    Pertinent History   Patient fx her sacrum 9/18 and  she lost sensation to right foot, and she lost ability to DF her right foot. Patient had back surgery 03/2017 , she was sent home. She was walking with no device indoors and she could get out of the house and do the steps. June 18, 2017  her LLE was getting weaker and the right leg was getting stronger. Patient had follow up with neurosurgery. She was scheduled for a steriod shot  Jul 23, 2017, but on May 3 her legs didnt  feel right, she fell 4 times, she fractured her R fibula  on may 3rd, she went to hospital and spent one week. She went to SNF for 1 month. She had another neuro consult, resulting in kDx of  polyneuropathy. Then she had an apt with and [facility] said it was not polyneuropathy, (in [place])  She has a second consultation scheduled Dec 30 for a  Neuromuscular.consult.  She had a scan of the spine T 12-28-17  who then sent her to a thoracic surgeon at the end of November and was recommended to not due surgery. She does not know what her Dx is and she has been having home PT beginning August 25, 2017 . She has had HHPT 2 x  week for the last 5 months. She is able to walk 50 feet with RW. She is able to transfer from wc to stand independently.     How long can you stand comfortably?  5 mins    Patient Stated Goals  to walk without the  RW, or walk better, use the bathroom    Currently in Pain?  No/denies    Pain Score  0-No pain      Treatment:  Patient ambulates on incline , ascending and descending 100 feet x 2 with SBA and R AFO and RW with wc following for safety  Step up 7 inch step with mod assit RW  Step down 3 inch step with CGA and RW  Gait on TM . 3 miles / min x 2 mins, x 1 min, x 1 min with B AFO's and better foot DF with poor RLE knee control   Patient has fatigue and needs min to mod assist for transfers on steps and vc for sequencing and safety.                       PT Education - 05/14/18 1546    Education provided  Yes    Education Details  HEP    Person(s) Educated  Patient;Spouse    Methods  Explanation;Demonstration;Tactile cues;Verbal cues    Comprehension  Verbalized understanding;Returned demonstration;Need further instruction       PT Short Term Goals - 03/07/18 1345      PT SHORT TERM GOAL #1   Title  Patient will be independent in home exercise program to improve strength/mobility for better functional independence with ADLs.    Time  6    Period  Weeks    Status  New    Target Date  04/18/18        PT Long Term Goals - 05/14/18 1704      PT LONG TERM GOAL #1   Title  Patient will increase 10 meter walk test to >.50 m/s as to improve gait speed for better community ambulation and to reduce fall risk.    Time  8    Period  Weeks    Status  Partially Met    Target Date  06/27/18      PT LONG TERM GOAL #2   Title  Patient will transfer sit to stand  from 19 inch mat without assist or anyone supporting AD  to demonstrate improved LE strength to decrease falls risk.     Time  12    Period  Weeks    Status  Partially Met    Target Date  06/27/18      PT  LONG TERM GOAL #3   Title  Patient will demonstrate stance without AD  x 1 min to demonstrate decreased falls risk.     Time  12    Period  Weeks    Status  Partially Met    Target Date  06/27/18       PT LONG TERM GOAL #4   Title  Patient will  ambulate 500 feet to work towards community ambulation distances.     Time  12    Period  Weeks    Status  Partially Met    Target Date  06/27/18            Plan - 05/14/18 1557    Clinical Impression Statement  Patient perfoms ambulation on incline ascending and descending wiht RW and SBA with R AFO. She practices wc mobiity descending on incline with SBA. She perfoms ambulation on TM at . 3 m/sec with B AFO trulife and better foot DF bilaterally. She will conintue to benefit from skilled PT to improve mobilty and strength.     Rehab Potential  Fair    Clinical Impairments Affecting Rehab Potential  Multiple tests concerning for neurologic involvement     PT Frequency  2x / week    PT Duration  12 weeks    PT Treatment/Interventions  Dry needling;Balance training;Neuromuscular re-education;Gait training;DME Instruction;Aquatic Therapy;Cryotherapy;Electrical Stimulation;Ultrasound;Moist Heat;Iontophoresis 74m/ml Dexamethasone;Stair training;Functional mobility training;Therapeutic activities;Therapeutic exercise;Orthotic Fit/Training;Patient/family education;Manual techniques;Wheelchair mobility training    PT Next Visit Plan  Progress quadricep/hamstring strengthening in [place] posterior hip musculature strengthening as tolerated.     PT Home Exercise Plan  need to do next visit as she was late to eval apt , [place]  Patient;Family member/caregiver    Family Member Consulted  Spouse        Patient will benefit from skilled therapeutic intervention in order to improve the following deficits and impairments:  Abnormal gait, Pain, Decreased endurance, Decreased activity tolerance, Decreased strength, Obesity, Difficulty walking, Decreased balance, Decreased safety awareness, Increased edema, Impaired sensation, Decreased knowledge of use of DME  Visit Diagnosis: Muscle weakness  (generalized)  Difficulty in walking, not elsewhere classified  Pain in right ankle and joints of right foot  Weakness of right leg  Pain in right hip     Problem List Patient Active Problem List   Diagnosis Date Noted  . Migraine without status migrainosus, not intractable   . Chronic pain syndrome   . Lumbar disc disease with radiculopathy 07/21/2017  . Lumbar foraminal stenosis 07/21/2017  . Unable to walk 07/21/2017  . Multiple falls 07/21/2017  . Displaced fracture of proximal end of right fibula 07/21/2017  . Lower extremity weakness 05/28/2017  . Right lower quadrant abdominal pain 08/22/2016  . Abnormal TSH 01/10/2016  . Morbid obesity ([place] 01/12/2014  . Routine general medical examination at a health care facility 10/28/2012  . GERD (gastroesophageal reflux disease) 10/28/2012  . Anxiety 12/20/2011  . Allergic rhinitis 12/20/2011  . Asthma 12/20/2011  . Lapband APL with HH repair 03/31/2011  . Uncontrolled type 2 diabetes mellitus with hyperglycemia, with long-term current use of insulin ([place] 06/30/2007  . Hyperlipidemia 06/30/2007  . Essential hypertension 06/27/2007    MAlanson Puls PT DPT 05/14/2018, 5:05 PM  [place] [facility] [address] [address]Phone: [phone]  Fax:  [phone] Name: Maria HONOLDMRN:  [id number] Date of Birth: Nov 11, 1965

## 2018-05-16 ENCOUNTER — Ambulatory Visit: Payer: BC Managed Care – PPO | Admitting: Physical Therapy

## 2018-05-16 ENCOUNTER — Encounter: Payer: Self-pay | Admitting: Physical Therapy

## 2018-05-16 DIAGNOSIS — M25571 Pain in right ankle and joints of right foot: Secondary | ICD-10-CM

## 2018-05-16 DIAGNOSIS — R29898 Other symptoms and signs involving the musculoskeletal system: Secondary | ICD-10-CM

## 2018-05-16 DIAGNOSIS — M6281 Muscle weakness (generalized): Secondary | ICD-10-CM

## 2018-05-16 DIAGNOSIS — M25551 Pain in right hip: Secondary | ICD-10-CM

## 2018-05-16 DIAGNOSIS — R262 Difficulty in walking, not elsewhere classified: Secondary | ICD-10-CM

## 2018-05-16 NOTE — Therapy (Signed)
[place] MAIN [facility] SERVICES [address] [place], [place], [address] Phone: [phone]   Fax:  [phone]  Physical Therapy Treatment  Patient Details  Name: [name] MRN: [id number] Date of Birth: 07-Jul-1965 Referring Provider (PT): Pleas Koch   Encounter Date: 05/16/2018  PT End of Session - 05/16/18 1538    Visit Number  15    Number of Visits  17    Date for PT Re-Evaluation  07/25/18    PT Start Time  0230    PT Stop Time  0315    PT Time Calculation (min)  45 min    Equipment Utilized During Treatment  Gait belt    Activity Tolerance  Patient tolerated treatment well;Patient limited by fatigue    Behavior During Therapy  WFL for tasks assessed/performed       Past Medical History:  Diagnosis Date  . Allergy   . Anemia   . Anxiety   . Arthritis   . Asthma   . Colon polyp   . Complication of anesthesia   . Diabetes mellitus   . Edema   . Fatigue   . Hypertension   . IBS (irritable bowel syndrome)   . Lump in female breast   . Methicillin resistant Staphylococcus aureus in conditions classified elsewhere and of unspecified site   . Migraines   . Morbid obesity ([place])   . Night sweats   . Nonspecific abnormal results of thyroid function study   . Other B-complex deficiencies   . Paresthesias   . PONV (postoperative nausea and vomiting)   . Pure hypercholesterolemia   . Reflux   . Sacral fracture ([place])   . Sinus problem   . Symptomatic states associated with artificial menopause   . Thyroid disease   . Type II or unspecified type diabetes mellitus without mention of complication, not stated as uncontrolled   . Unspecified sleep apnea     Past Surgical History:  Procedure Laterality Date  . abcess removal  1997   MRSA  . ABDOMINAL HYSTERECTOMY  1997   complete in 1997, partial was in 1995  . carpal tunnel release r  07/03/13   Dalldorf  . [place]  . CHOLECYSTECTOMY   03/1989  . COLONOSCOPY  03/21/2011   Normal.  [facility]/Hayes.  [name] KNEE SURGERY  2006  . LAPAROSCOPIC GASTRIC BANDING  05/2009  . TONSILLECTOMY AND ADENOIDECTOMY  1974  . TUBAL LIGATION    . ULNAR TUNNEL RELEASE Right 02/05/2018   Procedure: CUBITAL TUNNEL RELEASE/DECOMPRESSION;  Surgeon: Melrose Nakayama, MD;  Location: [place];  Service: Orthopedics;  Laterality: Right;    There were no vitals filed for this visit.  Subjective Assessment - 05/16/18 1535    Subjective  Patient reports she was able to leave her wc in her living room and she is now walking to use the bathroom.     Patient is accompained by:  Family member    Pertinent History   Patient fx her sacrum 9/18 and  she lost sensation to right foot, and she lost ability to DF her right foot. Patient had back surgery 03/2017 , she was sent home. She was walking with no device indoors and she could get out of the house and do the steps. June 18, 2017  her LLE was getting weaker and the right leg was getting stronger. Patient had follow up with neurosurgery. She was scheduled for a steriod shot  Jul 23, 2017, but on May 3 her legs didnt  feel right, she fell 4 times, she fractured her R fibula  on may 3rd, she went to hospital and spent one week. She went to SNF for 1 month. She had another neuro consult, resulting in kDx of  polyneuropathy. Then she had an apt with and [facility] said it was not polyneuropathy, (in [place])  She has a second consultation scheduled Dec 30 for a  Neuromuscular.consult.  She had a scan of the spine T 12-28-17  who then sent her to a thoracic surgeon at the end of November and was recommended to not due surgery. She does not know what her Dx is and she has been having home PT beginning August 25, 2017 . She has had HHPT 2 x  week for the last 5 months. She is able to walk 50 feet with RW. She is able to transfer from wc to stand independently.     How long can you stand comfortably?  5 mins    Patient Stated Goals  to walk without the  RW, or walk better, use the bathroom       Therapeutic activities:  Manual therapy  Long axis to LE in sitting to decrease knee pain bilaterally  Transfer training sit to stand from floor to stand with Rw and with loftstrand crutch , RAFO  Standing in parallel bars with one foot stationary and one foot fwd step, one foot bwd step x 10 , change fwd foot to front  Gait training in parallel  Bars with one loftstrand crutch 20 feet x 2 with crutch in R UE and then 20 feet x 2 with crutch in L UE   Practicing step up to 7 inch step and step down 7 inch but left knee was not stable to have second attempt    Pt educated throughout session about proper posture and technique with mod  verbal, visual, tactile cues.                       PT Education - 05/16/18 1535    Education provided  Yes    Education Details  HEP    Person(s) Educated  Patient    Methods  Explanation    Comprehension  Verbalized understanding       PT Short Term Goals - 03/07/18 1345      PT SHORT TERM GOAL #1   Title  Patient will be independent in home exercise program to improve strength/mobility for better functional independence with ADLs.    Time  6    Period  Weeks    Status  New    Target Date  04/18/18        PT Long Term Goals - 05/14/18 1704      PT LONG TERM GOAL #1   Title  Patient will increase 10 meter walk test to >.50 m/s as to improve gait speed for better community ambulation and to reduce fall risk.    Time  8    Period  Weeks    Status  Partially Met    Target Date  06/27/18      PT LONG TERM GOAL #2   Title  Patient will transfer sit to stand  from 19 inch mat without assist or anyone supporting AD  to demonstrate improved LE strength to decrease falls risk.     Time  12    Period  Weeks  Status  Partially Met    Target Date  06/27/18      PT LONG TERM GOAL #3   Title  Patient will demonstrate stance without AD  x 1 min to demonstrate decreased falls  risk.     Time  12    Period  Weeks    Status  Partially Met    Target Date  06/27/18      PT LONG TERM GOAL #4   Title  Patient will  ambulate 500 feet to work towards community ambulation distances.     Time  12    Period  Weeks    Status  Partially Met    Target Date  06/27/18            Plan - 05/16/18 1539    Clinical Impression Statement  Patient was able to progress to loftstrand crutch in parallel bars with one rail and one crutch with vC for sequencing. Patient performs step ups to TM from floor , 7 inches, and step downs from TM to floor x 1 repetition with left knee feeling like it could buckle. She continues to improve with transfers from various surfaces sit to stand with Rw and or loft strand crutches. She does have some mild to moderate anxiety but is able to compose herself and continue therapy.     Rehab Potential  Fair    Clinical Impairments Affecting Rehab Potential  Multiple tests concerning for neurologic involvement     PT Frequency  2x / week    PT Duration  12 weeks    PT Treatment/Interventions  Dry needling;Balance training;Neuromuscular re-education;Gait training;DME Instruction;Aquatic Therapy;Cryotherapy;Electrical Stimulation;Ultrasound;Moist Heat;Iontophoresis '4mg'$ /ml Dexamethasone;Stair training;Functional mobility training;Therapeutic activities;Therapeutic exercise;Orthotic Fit/Training;Patient/family education;Manual techniques;Wheelchair mobility training    PT Next Visit Plan  Progress quadricep/hamstring strengthening in [place], posterior hip musculature strengthening as tolerated.     PT Home Exercise Plan  need to do next visit as she was late to eval apt , she has HEP from [place] and Agree with Plan of Care  Patient;Family member/caregiver    Family Member Consulted  Spouse        Patient will benefit from skilled therapeutic intervention in order to improve the following deficits and impairments:  Abnormal gait, Pain, Decreased  endurance, Decreased activity tolerance, Decreased strength, Obesity, Difficulty walking, Decreased balance, Decreased safety awareness, Increased edema, Impaired sensation, Decreased knowledge of use of DME  Visit Diagnosis: Muscle weakness (generalized)  Difficulty in walking, not elsewhere classified  Pain in right ankle and joints of right foot  Weakness of right leg  Pain in right hip     Problem List Patient Active Problem List   Diagnosis Date Noted  . Migraine without status migrainosus, not intractable   . Chronic pain syndrome   . Lumbar disc disease with radiculopathy 07/21/2017  . Lumbar foraminal stenosis 07/21/2017  . Unable to walk 07/21/2017  . Multiple falls 07/21/2017  . Displaced fracture of proximal end of right fibula 07/21/2017  . Lower extremity weakness 05/28/2017  . Right lower quadrant abdominal pain 08/22/2016  . Abnormal TSH 01/10/2016  . Morbid obesity ([place]) 01/12/2014  . Routine general medical examination at a health care facility 10/28/2012  . GERD (gastroesophageal reflux disease) 10/28/2012  . Anxiety 12/20/2011  . Allergic rhinitis 12/20/2011  . Asthma 12/20/2011  . Lapband APL with HH repair 03/31/2011  . Uncontrolled type 2 diabetes mellitus with hyperglycemia, with long-term current use of insulin ([place]) 06/30/2007  .  Hyperlipidemia 06/30/2007  . Essential hypertension 06/27/2007    Alanson Puls, [place] DPT 05/16/2018, 3:44 PM  [place] MAIN [facility] SERVICES [address] [place], [place], [address] Phone: [phone]   Fax:  [phone]  Name: [name] MRN: [id number] Date of Birth: [birth year]

## 2018-05-21 ENCOUNTER — Ambulatory Visit: Payer: BC Managed Care – PPO | Attending: Primary Care | Admitting: Physical Therapy

## 2018-05-21 DIAGNOSIS — R262 Difficulty in walking, not elsewhere classified: Secondary | ICD-10-CM | POA: Diagnosis present

## 2018-05-21 DIAGNOSIS — R29898 Other symptoms and signs involving the musculoskeletal system: Secondary | ICD-10-CM

## 2018-05-21 DIAGNOSIS — M6281 Muscle weakness (generalized): Secondary | ICD-10-CM | POA: Diagnosis not present

## 2018-05-21 DIAGNOSIS — M25571 Pain in right ankle and joints of right foot: Secondary | ICD-10-CM | POA: Diagnosis present

## 2018-05-21 DIAGNOSIS — M25551 Pain in right hip: Secondary | ICD-10-CM

## 2018-05-21 NOTE — Therapy (Signed)
[place] MAIN [facility] SERVICES [address] [place], [place], [address] Phone: [phone]   Fax:  [phone]  Physical Therapy Treatment  Patient Details  Name: [name] MRN: [id number] Date of Birth: 11-27-1965 Referring Provider (PT): Pleas Koch   Encounter Date: 05/21/2018  PT End of Session - 05/21/18 1340    Visit Number  16    Number of Visits  33    Date for PT Re-Evaluation  07/25/18    PT Start Time  0105    PT Stop Time  0145    PT Time Calculation (min)  40 min    Equipment Utilized During Treatment  Gait belt    Activity Tolerance  Patient tolerated treatment well;Patient limited by fatigue    Behavior During Therapy  WFL for tasks assessed/performed       Past Medical History:  Diagnosis Date  . Allergy   . Anemia   . Anxiety   . Arthritis   . Asthma   . Colon polyp   . Complication of anesthesia   . Diabetes mellitus   . Edema   . Fatigue   . Hypertension   . IBS (irritable bowel syndrome)   . Lump in female breast   . Methicillin resistant Staphylococcus aureus in conditions classified elsewhere and of unspecified site   . Migraines   . Morbid obesity ([place])   . Night sweats   . Nonspecific abnormal results of thyroid function study   . Other B-complex deficiencies   . Paresthesias   . PONV (postoperative nausea and vomiting)   . Pure hypercholesterolemia   . Reflux   . Sacral fracture ([place])   . Sinus problem   . Symptomatic states associated with artificial menopause   . Thyroid disease   . Type II or unspecified type diabetes mellitus without mention of complication, not stated as uncontrolled   . Unspecified sleep apnea     Past Surgical History:  Procedure Laterality Date  . abcess removal  1997   MRSA  . ABDOMINAL HYSTERECTOMY  1997   complete in 1997, partial was in 1995  . carpal tunnel release r  07/03/13   Dalldorf  . [place]  . CHOLECYSTECTOMY   03/1989  . COLONOSCOPY  03/21/2011   Normal.  [facility].  [name] KNEE SURGERY  2006  . LAPAROSCOPIC GASTRIC BANDING  05/2009  . TONSILLECTOMY AND ADENOIDECTOMY  1974  . TUBAL LIGATION    . ULNAR TUNNEL RELEASE Right 02/05/2018   Procedure: CUBITAL TUNNEL RELEASE/DECOMPRESSION;  Surgeon: Melrose Nakayama, MD;  Location: [place];  Service: Orthopedics;  Laterality: Right;    There were no vitals filed for this visit.  Subjective Assessment - 05/21/18 1250    Subjective  Patient reports she was able to leave her wc in her living room and she is now walking to use the bathroom.     Patient is accompained by:  Family member    Pertinent History   Patient fx her sacrum 9/18 and  she lost sensation to right foot, and she lost ability to DF her right foot. Patient had back surgery 03/2017 , she was sent home. She was walking with no device indoors and she could get out of the house and do the steps. June 18, 2017  her LLE was getting weaker and the right leg was getting stronger. Patient had follow up with neurosurgery. She was scheduled for a steriod shot  Jul 23, 2017, but on May 3 her legs didnt  feel right, she fell 4 times, she fractured her R fibula  on may 3rd, she went to hospital and spent one week. She went to SNF for 1 month. She had another neuro consult, resulting in kDx of  polyneuropathy. Then she had an apt with and [facility] said it was not polyneuropathy, (in [place])  She has a second consultation scheduled Dec 30 for a  Neuromuscular.consult.  She had a scan of the spine T 12-28-17  who then sent her to a thoracic surgeon at the end of November and was recommended to not due surgery. She does not know what her Dx is and she has been having home PT beginning August 25, 2017 . She has had HHPT 2 x  week for the last 5 months. She is able to walk 50 feet with RW. She is able to transfer from wc to stand independently.     How long can you stand comfortably?  5 mins    Patient Stated Goals  to walk without the  RW, or walk better, use the bathroom    Currently in Pain?  Yes    Pain Score  2     Pain Location  Knee    Pain Orientation  Right;Left    Pain Type  Chronic pain    Pain Onset  1 to 4 weeks ago    Pain Frequency  Intermittent    Multiple Pain Sites  --   back pain 2/10          Treatment: Patient performs gait in parallel bars with 1 loftstrand crutch 10 x 2 sets, L side crutch and parallel bar, R side crutch and parallel bars x 10  X 2, CGA Gait training in parallel bars with 2 loftstrand crutches x 10 x 4 with CGA  Neuromuscular training: Standing and reaching for sorting balls 4 levels with CGA and occassional UE assist for LOB Patient is seeing an orthotists for AFO to assist with DF of ankle bilaterally                      PT Short Term Goals - 03/07/18 1345      PT SHORT TERM GOAL #1   Title  Patient will be independent in home exercise program to improve strength/mobility for better functional independence with ADLs.    Time  6    Period  Weeks    Status  New    Target Date  04/18/18        PT Long Term Goals - 05/14/18 1704      PT LONG TERM GOAL #1   Title  Patient will increase 10 meter walk test to >.50 m/s as to improve gait speed for better community ambulation and to reduce fall risk.    Time  8    Period  Weeks    Status  Partially Met    Target Date  06/27/18      PT LONG TERM GOAL #2   Title  Patient will transfer sit to stand  from 19 inch mat without assist or anyone supporting AD  to demonstrate improved LE strength to decrease falls risk.     Time  12    Period  Weeks    Status  Partially Met    Target Date  06/27/18      PT LONG TERM GOAL #3   Title  Patient will  demonstrate stance without AD  x 1 min to demonstrate decreased falls risk.     Time  12    Period  Weeks    Status  Partially Met    Target Date  06/27/18      PT LONG TERM GOAL #4   Title  Patient will  ambulate 500 feet to work towards community  ambulation distances.     Time  12    Period  Weeks    Status  Partially Met    Target Date  06/27/18            Plan - 05/21/18 1341    Clinical Impression Statement  Pt responded well to all interventions today.  Focus on LE strengthening in order to improve balance and ambulation.  Pt was able to complete LE exercises, requiring cueing for proper form.  Pt would continue to benefit from skilled therapy services in order to improve LE and core strength, gait and functional mobility in order to decrease risk of falls and improve independence with mobility.    Rehab Potential  Fair    Clinical Impairments Affecting Rehab Potential  Multiple tests concerning for neurologic involvement     PT Frequency  2x / week    PT Duration  12 weeks    PT Treatment/Interventions  Dry needling;Balance training;Neuromuscular re-education;Gait training;DME Instruction;Aquatic Therapy;Cryotherapy;Electrical Stimulation;Ultrasound;Moist Heat;Iontophoresis '4mg'$ /ml Dexamethasone;Stair training;Functional mobility training;Therapeutic activities;Therapeutic exercise;Orthotic Fit/Training;Patient/family education;Manual techniques;Wheelchair mobility training    PT Next Visit Plan  Progress quadricep/hamstring strengthening in [place], posterior hip musculature strengthening as tolerated.     PT Home Exercise Plan  need to do next visit as she was late to eval apt , she has HEP from [place] and Agree with Plan of Care  Patient;Family member/caregiver    Family Member Consulted  Spouse        Patient will benefit from skilled therapeutic intervention in order to improve the following deficits and impairments:  Abnormal gait, Pain, Decreased endurance, Decreased activity tolerance, Decreased strength, Obesity, Difficulty walking, Decreased balance, Decreased safety awareness, Increased edema, Impaired sensation, Decreased knowledge of use of DME  Visit Diagnosis: Muscle weakness  (generalized)  Difficulty in walking, not elsewhere classified  Pain in right ankle and joints of right foot  Weakness of right leg  Pain in right hip     Problem List Patient Active Problem List   Diagnosis Date Noted  . Migraine without status migrainosus, not intractable   . Chronic pain syndrome   . Lumbar disc disease with radiculopathy 07/21/2017  . Lumbar foraminal stenosis 07/21/2017  . Unable to walk 07/21/2017  . Multiple falls 07/21/2017  . Displaced fracture of proximal end of right fibula 07/21/2017  . Lower extremity weakness 05/28/2017  . Right lower quadrant abdominal pain 08/22/2016  . Abnormal TSH 01/10/2016  . Morbid obesity ([place]) 01/12/2014  . Routine general medical examination at a health care facility 10/28/2012  . GERD (gastroesophageal reflux disease) 10/28/2012  . Anxiety 12/20/2011  . Allergic rhinitis 12/20/2011  . Asthma 12/20/2011  . Lapband APL with HH repair 03/31/2011  . Uncontrolled type 2 diabetes mellitus with hyperglycemia, with long-term current use of insulin ([place]) 06/30/2007  . Hyperlipidemia 06/30/2007  . Essential hypertension 06/27/2007    Alanson Puls , PT DPT [place] [facility] MAIN [facility] SERVICES [address] [place], [place], [address] Phone: [phone]   Fax:  [phone]  Name: [name] MRN: [id number] Date of  Birth: 05/05/1965

## 2018-05-23 ENCOUNTER — Encounter: Payer: Self-pay | Admitting: Physical Therapy

## 2018-05-23 ENCOUNTER — Ambulatory Visit: Payer: BC Managed Care – PPO | Admitting: Physical Therapy

## 2018-05-23 DIAGNOSIS — M25571 Pain in right ankle and joints of right foot: Secondary | ICD-10-CM

## 2018-05-23 DIAGNOSIS — M6281 Muscle weakness (generalized): Secondary | ICD-10-CM | POA: Diagnosis not present

## 2018-05-23 DIAGNOSIS — R262 Difficulty in walking, not elsewhere classified: Secondary | ICD-10-CM

## 2018-05-23 DIAGNOSIS — R29898 Other symptoms and signs involving the musculoskeletal system: Secondary | ICD-10-CM

## 2018-05-23 DIAGNOSIS — M25551 Pain in right hip: Secondary | ICD-10-CM

## 2018-05-23 NOTE — Therapy (Signed)
[place] MAIN [facility] SERVICES [address] [place], [place], [address] Phone: [phone]   Fax:  [phone]  Physical Therapy Treatment  Patient Details  Name: [name] MRN: [id number] Date of Birth: 02-Jan-1966 Referring Provider (PT): Pleas Koch   Encounter Date: 05/23/2018  PT End of Session - 05/23/18 1326    Visit Number  17    Number of Visits  33    Date for PT Re-Evaluation  07/25/18    PT Start Time  0100    PT Stop Time  0145    PT Time Calculation (min)  45 min    Equipment Utilized During Treatment  Gait belt    Activity Tolerance  Patient tolerated treatment well;Patient limited by fatigue    Behavior During Therapy  [facility] for tasks assessed/performed       Past Medical History:  Diagnosis Date  . Allergy   . Anemia   . Anxiety   . Arthritis   . Asthma   . Colon polyp   . Complication of anesthesia   . Diabetes mellitus   . Edema   . Fatigue   . Hypertension   . IBS (irritable bowel syndrome)   . Lump in female breast   . Methicillin resistant Staphylococcus aureus in conditions classified elsewhere and of unspecified site   . Migraines   . Morbid obesity ([place])   . Night sweats   . Nonspecific abnormal results of thyroid function study   . Other B-complex deficiencies   . Paresthesias   . PONV (postoperative nausea and vomiting)   . Pure hypercholesterolemia   . Reflux   . Sacral fracture ([place])   . Sinus problem   . Symptomatic states associated with artificial menopause   . Thyroid disease   . Type II or unspecified type diabetes mellitus without mention of complication, not stated as uncontrolled   . Unspecified sleep apnea     Past Surgical History:  Procedure Laterality Date  . abcess removal  1997   MRSA  . ABDOMINAL HYSTERECTOMY  1997   complete in 1997, partial was in 1995  . carpal tunnel release r  07/03/13   Dal[place]orf  . [place]  . CHOLECYSTECTOMY   03/1989  . COLONOSCOPY  03/21/2011   Normal.  [facility].  [name] KNEE SURGERY  2006  . LAPAROSCOPIC GASTRIC BANDING  05/2009  . TONSILLECTOMY AND ADENOIDECTOMY  1974  . TUBAL LIGATION    . ULNAR TUNNEL RELEASE Right 02/05/2018   Procedure: CUBITAL TUNNEL RELEASE/DECOMPRESSION;  Surgeon: Melrose Nakayama, MD;  Location: [place];  Service: Orthopedics;  Laterality: Right;    There were no vitals filed for this visit.  Subjective Assessment - 05/23/18 1312    Subjective  Patient reports she went to the orthotist today for B ankle foot orthosis.     Patient is accompained by:  Family member    Pertinent History   Patient fx her sacrum 9/18 and  she lost sensation to right foot, and she lost ability to DF her right foot. Patient had back surgery 03/2017 , she was sent home. She was walking with no device indoors and she could get out of the house and do the steps. June 18, 2017  her LLE was getting weaker and the right leg was getting stronger. Patient had follow up with neurosurgery. She was scheduled for a steriod shot Jul 23, 2017, but on May 3 her legs  didnt  feel right, she fell 4 times, she fractured her R fibula  on may 3rd, she went to hospital and spent one week. She went to SNF for 1 month. She had another neuro consult, resulting in kDx of  polyneuropathy. Then she had an apt with and [facility] said it was not polyneuropathy, (in [place])  She has a second consultation scheduled Dec 30 for a  Neuromuscular.consult.  She had a scan of the spine T 12-28-17  who then sent her to a thoracic surgeon at the end of November and was recommended to not due surgery. She does not know what her Dx is and she has been having home PT beginning August 25, 2017 . She has had HHPT 2 x  week for the last 5 months. She is able to walk 50 feet with RW. She is able to transfer from wc to stand independently.     How long can you stand comfortably?  5 mins    Patient Stated Goals  to walk without the RW, or walk better, use the  bathroom    Currently in Pain?  Yes    Pain Score  2     Pain Location  Hip    Pain Orientation  Right    Pain Descriptors / Indicators  Aching    Pain Type  Acute pain    Pain Onset  1 to 4 weeks ago    Aggravating Factors    lifting knee    Pain Relieving Factors  rest    Effect of Pain on Daily Activities  none    Multiple Pain Sites  --   left knee stiffness      Treatment:  Interval stand to sit with 3 holds x 6 reps   AAROM BLE hip abd/add x 10 x 2 Left LE greater assist than RLE  AAROM BLE flex/ext x 10 x 2 , Left LE greater assist than RLE  wc <> mat transfer with independent  standing hip abd x 15 x 2 BLE  standing hip ext x 15 x 2 BLE  standing marching x 15 x 2 BLE in parallel bars  Supinemarching x 10 x 2  BLE,   Supine  hip abd /ER with RTB x 15 x 2   Supine AROM/ AAROM hip  flex /ext x 15 x 2  Prone hip extension x 10 x 2 BLE , assist with RLE  sidelying hip abd x 10 x 2 BLE  sidelying hip flex / ext x 10 BLE  Pt educated throughout session about proper posture and technique with exercises. Improved exercise technique,                          PT Education - 05/23/18 1326    Education provided  Yes    Education Details  HEP    Person(s) Educated  Patient    Methods  Demonstration;Explanation    Comprehension  Returned demonstration;Need further instruction       PT Short Term Goals - 03/07/18 1345      PT SHORT TERM GOAL #1   Title  Patient will be independent in home exercise program to improve strength/mobility for better functional independence with ADLs.    Time  6    Period  Weeks    Status  New    Target Date  04/18/18        PT Long Term Goals - 05/14/18 1704  PT LONG TERM GOAL #1   Title  Patient will increase 10 meter walk test to >.50 m/s as to improve gait speed for better community ambulation and to reduce fall risk.    Time  8    Period  Weeks    Status  Partially Met    Target Date   06/27/18      PT LONG TERM GOAL #2   Title  Patient will transfer sit to stand  from 19 inch mat without assist or anyone supporting AD  to demonstrate improved LE strength to decrease falls risk.     Time  12    Period  Weeks    Status  Partially Met    Target Date  06/27/18      PT LONG TERM GOAL #3   Title  Patient will demonstrate stance without AD  x 1 min to demonstrate decreased falls risk.     Time  12    Period  Weeks    Status  Partially Met    Target Date  06/27/18      PT LONG TERM GOAL #4   Title  Patient will  ambulate 500 feet to work towards community ambulation distances.     Time  12    Period  Weeks    Status  Partially Met    Target Date  06/27/18            Plan - 05/23/18 1330    Clinical Impression Statement  Pt responded well to all interventions today.  Focus on core and hip strengthening in order to improve balance and ambulation.  Pt attempted hip extension with assist on RLE and independent on LLE> Pt was able to complete core exercises, requiring cueing for proper form and hip alignment during seated and supine core activities. Pt would continue to benefit from skilled therapy services in order to improve LE and core strength, gait and functional mobility in order to decrease risk of falls and improve independence with mobility.    Rehab Potential  Fair    Clinical Impairments Affecting Rehab Potential  Multiple tests concerning for neurologic involvement     PT Frequency  2x / week    PT Duration  12 weeks    PT Treatment/Interventions  Dry needling;Balance training;Neuromuscular re-education;Gait training;DME Instruction;Aquatic Therapy;Cryotherapy;Electrical Stimulation;Ultrasound;Moist Heat;Iontophoresis 96m/ml Dexamethasone;Stair training;Functional mobility training;Therapeutic activities;Therapeutic exercise;Orthotic Fit/Training;Patient/family education;Manual techniques;Wheelchair mobility training    PT Next Visit Plan  Progress  quadricep/hamstring strengthening in [place] posterior hip musculature strengthening as tolerated.     PT Home Exercise Plan  need to do next visit as she was late to eval apt , [place] [facility] Agree with Plan of Care  Patient;Family member/caregiver    Family Member Consulted  Spouse        Patient will benefit from skilled therapeutic intervention in order to improve the following deficits and impairments:  Abnormal gait, Pain, Decreased endurance, Decreased activity tolerance, Decreased strength, Obesity, Difficulty walking, Decreased balance, Decreased safety awareness, Increased edema, Impaired sensation, Decreased knowledge of use of DME  Visit Diagnosis: Muscle weakness (generalized)  Difficulty in walking, not elsewhere classified  Pain in right ankle and joints of right foot  Weakness of right leg  Pain in right hip     Problem List Patient Active Problem List   Diagnosis Date Noted  . Migraine without status migrainosus, not intractable   . Chronic pain syndrome   . Lumbar  disc disease with radiculopathy 07/21/2017  . Lumbar foraminal stenosis 07/21/2017  . Unable to walk 07/21/2017  . Multiple falls 07/21/2017  . Displaced fracture of proximal end of right fibula 07/21/2017  . Lower extremity weakness 05/28/2017  . Right lower quadrant abdominal pain 08/22/2016  . Abnormal TSH 01/10/2016  . Morbid obesity ([place]) 01/12/2014  . Routine general medical examination at a health care facility 10/28/2012  . GERD (gastroesophageal reflux disease) 10/28/2012  . Anxiety 12/20/2011  . Allergic rhinitis 12/20/2011  . Asthma 12/20/2011  . Lapband APL with HH repair 03/31/2011  . Uncontrolled type 2 diabetes mellitus with hyperglycemia, with long-term current use of insulin ([place]) 06/30/2007  . Hyperlipidemia 06/30/2007  . Essential hypertension 06/27/2007    Alanson Puls, PT DPT 05/23/2018, 1:59 PM  [place] MAIN [facility] SERVICES [address] [place], [place], [address] Phone: [phone]   Fax:  [phone]  Name: ANGELIKA JERRETT MRN: [id number] Date of Birth: 02/03/1966

## 2018-05-27 ENCOUNTER — Telehealth: Payer: Self-pay | Admitting: Primary Care

## 2018-05-27 NOTE — Telephone Encounter (Addendum)
Message left for [name] to return my call.  Sending patient a message through [employer]

## 2018-05-27 NOTE — Telephone Encounter (Signed)
Best number [phone] Hinton Dyer @ [facility]  Called to get dx code for pt  prosthetics

## 2018-05-28 ENCOUNTER — Ambulatory Visit: Payer: BC Managed Care – PPO | Admitting: Physical Therapy

## 2018-05-28 ENCOUNTER — Encounter: Payer: Self-pay | Admitting: Physical Therapy

## 2018-05-28 DIAGNOSIS — M25571 Pain in right ankle and joints of right foot: Secondary | ICD-10-CM

## 2018-05-28 DIAGNOSIS — M6281 Muscle weakness (generalized): Secondary | ICD-10-CM | POA: Diagnosis not present

## 2018-05-28 DIAGNOSIS — R262 Difficulty in walking, not elsewhere classified: Secondary | ICD-10-CM

## 2018-05-28 DIAGNOSIS — M25551 Pain in right hip: Secondary | ICD-10-CM

## 2018-05-28 DIAGNOSIS — R29898 Other symptoms and signs involving the musculoskeletal system: Secondary | ICD-10-CM

## 2018-05-28 NOTE — Telephone Encounter (Signed)
Spoken to patient. She stated that [facility] are fitting brace for foot drop, she getting braces for both feet. Anda Kraft, what Dx do you want me to give them?

## 2018-05-28 NOTE — Therapy (Signed)
[place] MAIN [facility] SERVICES [address] [place], [place], [address] Phone: [phone]   Fax:  [phone]  Physical Therapy Treatment  Patient Details  Name: [name] MRN: [id number] Date of Birth: 1965-05-20 Referring Provider (PT): Pleas Koch   Encounter Date: 05/28/2018  PT End of Session - 05/28/18 1424    Visit Number  18    Number of Visits  33    Date for PT Re-Evaluation  07/25/18    PT Start Time  0105    PT Stop Time  0145    PT Time Calculation (min)  40 min    Equipment Utilized During Treatment  Gait belt    Activity Tolerance  Patient tolerated treatment well;Patient limited by fatigue    Behavior During Therapy  WFL for tasks assessed/performed       Past Medical History:  Diagnosis Date  . Allergy   . Anemia   . Anxiety   . Arthritis   . Asthma   . Colon polyp   . Complication of anesthesia   . Diabetes mellitus   . Edema   . Fatigue   . Hypertension   . IBS (irritable bowel syndrome)   . Lump in female breast   . Methicillin resistant Staphylococcus aureus in conditions classified elsewhere and of unspecified site   . Migraines   . Morbid obesity ([place])   . Night sweats   . Nonspecific abnormal results of thyroid function study   . Other B-complex deficiencies   . Paresthesias   . PONV (postoperative nausea and vomiting)   . Pure hypercholesterolemia   . Reflux   . Sacral fracture ([place])   . Sinus problem   . Symptomatic states associated with artificial menopause   . Thyroid disease   . Type II or unspecified type diabetes mellitus without mention of complication, not stated as uncontrolled   . Unspecified sleep apnea     Past Surgical History:  Procedure Laterality Date  . abcess removal  1997   MRSA  . ABDOMINAL HYSTERECTOMY  1997   complete in 1997, partial was in 1995  . carpal tunnel release r  07/03/13   [place]al[place]orf  . [place]  . CHOLECYSTECTOMY   03/1989  . COLONOSCOPY  03/21/2011   Normal.  [facility].  [name] KNEE SURGERY  2006  . LAPAROSCOPIC GASTRIC BANDING  05/2009  . TONSILLECTOMY AND ADENOIDECTOMY  1974  . TUBAL LIGATION    . ULNAR TUNNEL RELEASE Right 02/05/2018   Procedure: CUBITAL TUNNEL RELEASE/DECOMPRESSION;  Surgeon: Melrose Nakayama, MD;  Location: [place];  Service: Orthopedics;  Laterality: Right;    There were no vitals filed for this visit.  Subjective Assessment - 05/28/18 1423    Subjective   Patient reports she is doing well. She is doing her HEP.    Patient is accompained by:  Family member    Pertinent History   Patient fx her sacrum 9/18 and  she lost sensation to right foot, and she lost ability to DF her right foot. Patient had back surgery 03/2017 , she was sent home. She was walking with no device indoors and she could get out of the house and do the steps. June 18, 2017  her LLE was getting weaker and the right leg was getting stronger. Patient had follow up with neurosurgery. She was scheduled for a steriod shot Jul 23, 2017, but on May 3 her legs didnt  feel right, she fell 4 times, she fractured her R fibula  on may 3rd, she went to hospital and spent one week. She went to SNF for 1 month. She had another neuro consult, resulting in kDx of  polyneuropathy. Then she had an apt with and [facility] said it was not polyneuropathy, (in [place])  She has a second consultation scheduled Dec 30 for a  Neuromuscular.consult.  She had a scan of the spine T 12-28-17  who then sent her to a thoracic surgeon at the end of November and was recommended to not due surgery. She does not know what her Dx is and she has been having home PT beginning August 25, 2017 . She has had HHPT 2 x  week for the last 5 months. She is able to walk 50 feet with RW. She is able to transfer from wc to stand independently.     How long can you stand comfortably?  5 mins    Patient Stated Goals  to walk without the RW, or walk better, use the bathroom    Currently  in Pain?  No/denies    Pain Score  0-No pain    Pain Onset  1 to 4 weeks ago        Treatment:   Transfer training wc to TM with mod assist one hand on TM ( right ) and one hand on RW that is placed on top of TM with her husband holding the walker steady.  Transfer training TM stepping down to large green step stool with backward step down to green stool leading with left foot down , then sitting down in wc from the green step stool   Gait training with TM for 3 mins x 4 at . 2 miles / hour and then increased to . 3 miles / hour with rest periods every 3 mins  Interval stand to sit with  4 holds x 5 reps   Patient is fatigued after treatment , with no pain reports                    PT Education - 05/28/18 1424    Education provided  Yes    Education Details  HEP    Person(s) Educated  Patient;Spouse    Methods  Explanation;Demonstration    Comprehension  Returned demonstration       PT Short Term Goals - 03/07/18 1345      PT SHORT TERM GOAL #1   Title  Patient will be independent in home exercise program to improve strength/mobility for better functional independence with ADLs.    Time  6    Period  Weeks    Status  New    Target Date  04/18/18        PT Long Term Goals - 05/14/18 1704      PT LONG TERM GOAL #1   Title  Patient will increase 10 meter walk test to >.50 m/s as to improve gait speed for better community ambulation and to reduce fall risk.    Time  8    Period  Weeks    Status  Partially Met    Target Date  06/27/18      PT LONG TERM GOAL #2   Title  Patient will transfer sit to stand  from 19 inch mat without assist or anyone supporting AD  to demonstrate improved LE strength to decrease falls risk.     Time  12  Period  Weeks    Status  Partially Met    Target Date  06/27/18      PT LONG TERM GOAL #3   Title  Patient will demonstrate stance without AD  x 1 min to demonstrate decreased falls risk.     Time  12    Period   Weeks    Status  Partially Met    Target Date  06/27/18      PT LONG TERM GOAL #4   Title  Patient will  ambulate 500 feet to work towards community ambulation distances.     Time  12    Period  Weeks    Status  Partially Met    Target Date  06/27/18            Plan - 05/28/18 1425    Clinical Impression Statement  Patient performed transfers and gait on TM with improved time and speed. She transfered with less assistance and better stability today. She has left knee  weakness and needs rest periods intermittently. She will continue to benefit from skilled PT to improve strength and mobility.     Rehab Potential  Fair    Clinical Impairments Affecting Rehab Potential  Multiple tests concerning for neurologic involvement     PT Frequency  2x / week    PT Duration  12 weeks    PT Treatment/Interventions  Dry needling;Balance training;Neuromuscular re-education;Gait training;DME Instruction;Aquatic Therapy;Cryotherapy;Electrical Stimulation;Ultrasound;Moist Heat;Iontophoresis 4mg/ml Dexamethasone;Stair training;Functional mobility training;Therapeutic activities;Therapeutic exercise;Orthotic Fit/Training;Patient/family education;Manual techniques;Wheelchair mobility training    PT Next Visit Plan  Progress quadricep/hamstring strengthening in [place], posterior hip musculature strengthening as tolerated.     PT Home Exercise Plan  need to do next visit as she was late to eval apt , she has HEP from HHPT    Consulted and Agree with Plan of Care  Patient;Family member/caregiver    Family Member Consulted  Spouse        Patient will benefit from skilled therapeutic intervention in order to improve the following deficits and impairments:  Abnormal gait, Pain, Decreased endurance, Decreased activity tolerance, Decreased strength, Obesity, Difficulty walking, Decreased balance, Decreased safety awareness, Increased edema, Impaired sensation, Decreased knowledge of use of DME  Visit  Diagnosis: Muscle weakness (generalized)  Difficulty in walking, not elsewhere classified  Pain in right ankle and joints of right foot  Weakness of right leg  Pain in right hip     Problem List Patient Active Problem List   Diagnosis Date Noted  . Migraine without status migrainosus, not intractable   . Chronic pain syndrome   . Lumbar disc disease with radiculopathy 07/21/2017  . Lumbar foraminal stenosis 07/21/2017  . Unable to walk 07/21/2017  . Multiple falls 07/21/2017  . Displaced fracture of proximal end of right fibula 07/21/2017  . Lower extremity weakness 05/28/2017  . Right lower quadrant abdominal pain 08/22/2016  . Abnormal TSH 01/10/2016  . Morbid obesity ([facility]) 01/12/2014  . Routine general medical examination at a health care facility 10/28/2012  . GERD (gastroesophageal reflux disease) 10/28/2012  . Anxiety 12/20/2011  . Allergic rhinitis 12/20/2011  . Asthma 12/20/2011  . Lapband APL with HH repair 03/31/2011  . Uncontrolled type 2 diabetes mellitus with hyperglycemia, with long-term current use of insulin ([facility]) 06/30/2007  . Hyperlipidemia 06/30/2007  . Essential hypertension 06/27/2007    Mansfield, Kristine S , PT DPT 05/28/2018, 2:33 PM  [facility] MAIN REHAB SERVICES [address] [place], [place],   [address] Phone: [phone]   Fax:  [phone]  Name: [name] MRN: [id number] Date of Birth: 12/07/1965   

## 2018-05-28 NOTE — Telephone Encounter (Signed)
Chronic lower extremity weakness. Recurrent Falls Muscle weakness

## 2018-05-29 ENCOUNTER — Ambulatory Visit: Payer: BC Managed Care – PPO | Admitting: Primary Care

## 2018-05-29 NOTE — Telephone Encounter (Addendum)
Message left for [name] to return my call.  Lower extremity weakness R29.898  Recurrent falls  R29.6

## 2018-05-29 NOTE — Telephone Encounter (Signed)
Hinton Dyer called back and notified as instructed. Hinton Dyer voiced understanding and that was all she needed.

## 2018-05-30 ENCOUNTER — Ambulatory Visit: Payer: BC Managed Care – PPO | Admitting: Physical Therapy

## 2018-06-02 ENCOUNTER — Other Ambulatory Visit: Payer: Self-pay | Admitting: Primary Care

## 2018-06-02 DIAGNOSIS — M5116 Intervertebral disc disorders with radiculopathy, lumbar region: Secondary | ICD-10-CM

## 2018-06-04 ENCOUNTER — Other Ambulatory Visit: Payer: Self-pay

## 2018-06-04 ENCOUNTER — Ambulatory Visit: Payer: BC Managed Care – PPO

## 2018-06-04 DIAGNOSIS — M6281 Muscle weakness (generalized): Secondary | ICD-10-CM

## 2018-06-04 DIAGNOSIS — M25571 Pain in right ankle and joints of right foot: Secondary | ICD-10-CM

## 2018-06-04 DIAGNOSIS — R262 Difficulty in walking, not elsewhere classified: Secondary | ICD-10-CM

## 2018-06-04 DIAGNOSIS — M25551 Pain in right hip: Secondary | ICD-10-CM

## 2018-06-04 DIAGNOSIS — R29898 Other symptoms and signs involving the musculoskeletal system: Secondary | ICD-10-CM

## 2018-06-04 NOTE — Therapy (Signed)
[place] MAIN [facility] SERVICES [address] [place], [place], [address] Phone: [phone]   Fax:  [phone]  Physical Therapy Treatment  Patient Details  Name: [name] MRN: [id number] Date of Birth: [birth year] Referring Provider (PT): Pleas Koch   Encounter Date: 06/04/2018  PT End of Session - 06/04/18 1304    Visit Number  19    Number of Visits  33    Date for PT Re-Evaluation  07/25/18    PT Start Time  1257    PT Stop Time  1340    PT Time Calculation (min)  43 min    Equipment Utilized During Treatment  Gait belt    Activity Tolerance  Patient tolerated treatment well;Patient limited by fatigue    Behavior During Therapy  WFL for tasks assessed/performed       Past Medical History:  Diagnosis Date  . Allergy   . Anemia   . Anxiety   . Arthritis   . Asthma   . Colon polyp   . Complication of anesthesia   . Diabetes mellitus   . Edema   . Fatigue   . Hypertension   . IBS (irritable bowel syndrome)   . Lump in female breast   . Methicillin resistant Staphylococcus aureus in conditions classified elsewhere and of unspecified site   . Migraines   . Morbid obesity ([place])   . Night sweats   . Nonspecific abnormal results of thyroid function study   . Other B-complex deficiencies   . Paresthesias   . PONV (postoperative nausea and vomiting)   . Pure hypercholesterolemia   . Reflux   . Sacral fracture ([place])   . Sinus problem   . Symptomatic states associated with artificial menopause   . Thyroid disease   . Type II or unspecified type diabetes mellitus without mention of complication, not stated as uncontrolled   . Unspecified sleep apnea     Past Surgical History:  Procedure Laterality Date  . abcess removal  1997   MRSA  . ABDOMINAL HYSTERECTOMY  1997   complete in 1997, partial was in 1995  . carpal tunnel release r  07/03/13   Dalldorf  . [place]  . CHOLECYSTECTOMY   03/1989  . COLONOSCOPY  03/21/2011   Normal.  [facility].  [name] KNEE SURGERY  2006  . LAPAROSCOPIC GASTRIC BANDING  05/2009  . TONSILLECTOMY AND ADENOIDECTOMY  1974  . TUBAL LIGATION    . ULNAR TUNNEL RELEASE Right 02/05/2018   Procedure: CUBITAL TUNNEL RELEASE/DECOMPRESSION;  Surgeon: Melrose Nakayama, MD;  Location: [facility];  Service: Orthopedics;  Laterality: Right;    There were no vitals filed for this visit.  Subjective Assessment - 06/04/18 1300    Subjective  Pt doing well in general, still working on HEP at home and walking with RW. Her right knee has been giving her alot of trouble as of late, partiularly at night.     Patient is accompained by:  Family member    Pertinent History   Patient fx her sacrum 9/18 and  she lost sensation to right foot, and she lost ability to DF her right foot. Patient had back surgery 03/2017 , she was sent home. She was walking with no device indoors and she could get out of the house and do the steps. June 18, 2017  her LLE was getting weaker and the right leg was getting stronger. Patient had follow up  with neurosurgery. She was scheduled for a steriod shot Jul 23, 2017, but on May 3 her legs didnt  feel right, she fell 4 times, she fractured her R fibula  on may 3rd, she went to hospital and spent one week. She went to SNF for 1 month. She had another neuro consult, resulting in kDx of  polyneuropathy. Then she had an apt with and [facility] said it was not polyneuropathy, (in [place])  She has a second consultation scheduled Dec 30 for a  Neuromuscular.consult.  She had a scan of the spine T 12-28-17  who then sent her to a thoracic surgeon at the end of November and was recommended to not due surgery. She does not know what her Dx is and she has been having home PT beginning August 25, 2017 . She has had HHPT 2 x  week for the last 5 months. She is able to walk 50 feet with RW. She is able to transfer from wc to stand independently.     Currently in Pain?  Yes    Pain Score   2        INTERVENTION THIS DATE:  Neuromuscular Reeducation -Seated LAQ 1x15 @ 0lb bilat; 2x15 LAQ 2x15 bilat @ 2.5lb  -STS transfers from [facility] with BUE on // bars x2 -AMB in // bars 1x30f -retro AMB in // bars 1x424f -lateral stepping in // bars 1x3043filat   Therapeutic Exercise -STS from WC [facility]//bars c slow/progressive eccentric lowering (over 10seconds) 5x -Leg Press (seat hole 6): RLE extension 1x10 '@30lb'$ ; 1x10 '@45lb'$ ; 1x15 '@50lbs'$   -Leg Press (seat hole 6): LLE extension 1x10 '@30lb'$ ; 1x10 '@35lb'$ ; 1x15 @ 30lbs -Stand Pivot Transfer WC to/from leg press 2x, MinGuard assist for WC block, pt using Standard Walker     PT Short Term Goals - 03/07/18 1345      PT SHORT TERM GOAL #1   Title  Patient will be independent in home exercise program to improve strength/mobility for better functional independence with ADLs.    Time  6    Period  Weeks    Status  New    Target Date  04/18/18        PT Long Term Goals - 05/14/18 1704      PT LONG TERM GOAL #1   Title  Patient will increase 10 meter walk test to >.50 m/s as to improve gait speed for better community ambulation and to reduce fall risk.    Time  8    Period  Weeks    Status  Partially Met    Target Date  06/27/18      PT LONG TERM GOAL #2   Title  Patient will transfer sit to stand  from 19 inch mat without assist or anyone supporting AD  to demonstrate improved LE strength to decrease falls risk.     Time  12    Period  Weeks    Status  Partially Met    Target Date  06/27/18      PT LONG TERM GOAL #3   Title  Patient will demonstrate stance without AD  x 1 min to demonstrate decreased falls risk.     Time  12    Period  Weeks    Status  Partially Met    Target Date  06/27/18      PT LONG TERM GOAL #4   Title  Patient will  ambulate 500 feet to work towards community ambulation distances.  Time  12    Period  Weeks    Status  Partially Met    Target Date  06/27/18            Plan - 06/04/18  1343    Clinical Impression Statement  Continued with plan of care as laid out in evaluation and additional prior episodes. Pt able to perform session in full with intermittent rest breaks offered PRN. Pt requires verbal and tactile cues intermittently for exercise instruction and correct form. Pt continues to demonstrate gradual progress toward goals AEB ability to tolerate progression in reps and/or sets this date. Focused on multiplanar AMB in // bars this date. Pt also does well with trial of single leg extension on leg press. Pt continues to demonstrate excellent progress toward goals.     Rehab Potential  Fair    Clinical Impairments Affecting Rehab Potential  Multiple tests concerning for neurologic involvement     PT Frequency  2x / week    PT Duration  12 weeks    PT Treatment/Interventions  Dry needling;Balance training;Neuromuscular re-education;Gait training;DME Instruction;Aquatic Therapy;Cryotherapy;Electrical Stimulation;Ultrasound;Moist Heat;Iontophoresis '4mg'$ /ml Dexamethasone;Stair training;Functional mobility training;Therapeutic activities;Therapeutic exercise;Orthotic Fit/Training;Patient/family education;Manual techniques;Wheelchair mobility training    PT Next Visit Plan  Progress quadricep/hamstring strengthening in [place], posterior hip musculature strengthening as tolerated.     PT Home Exercise Plan  No updates this date     Consulted and Agree with Plan of Care  Patient;Family member/caregiver    Family Member Consulted  Spouse        Patient will benefit from skilled therapeutic intervention in order to improve the following deficits and impairments:  Abnormal gait, Pain, Decreased endurance, Decreased activity tolerance, Decreased strength, Obesity, Difficulty walking, Decreased balance, Decreased safety awareness, Increased edema, Impaired sensation, Decreased knowledge of use of DME  Visit Diagnosis: Muscle weakness (generalized)  Difficulty in walking, not elsewhere  classified  Pain in right ankle and joints of right foot  Weakness of right leg  Pain in right hip     Problem List Patient Active Problem List   Diagnosis Date Noted  . Migraine without status migrainosus, not intractable   . Chronic pain syndrome   . Lumbar disc disease with radiculopathy 07/21/2017  . Lumbar foraminal stenosis 07/21/2017  . Unable to walk 07/21/2017  . Multiple falls 07/21/2017  . Displaced fracture of proximal end of right fibula 07/21/2017  . Lower extremity weakness 05/28/2017  . Right lower quadrant abdominal pain 08/22/2016  . Abnormal TSH 01/10/2016  . Morbid obesity ([place]) 01/12/2014  . Routine general medical examination at a health care facility 10/28/2012  . GERD (gastroesophageal reflux disease) 10/28/2012  . Anxiety 12/20/2011  . Allergic rhinitis 12/20/2011  . Asthma 12/20/2011  . Lapband APL with HH repair 03/31/2011  . Uncontrolled type 2 diabetes mellitus with hyperglycemia, with long-term current use of insulin ([place]) 06/30/2007  . Hyperlipidemia 06/30/2007  . Essential hypertension 06/27/2007   1:53 PM, 06/04/18 Etta Grandchild, PT, DPT Physical Therapist - [place] [phone]    Etta Grandchild 06/04/2018, 1:48 PM  [place] MAIN [facility] SERVICES [address] [place], [place], [address] Phone: [phone]   Fax:  [phone]  Name: MIYA LUVIANO MRN: [id number] Date of Birth: [birth year]

## 2018-06-06 ENCOUNTER — Ambulatory Visit: Payer: BC Managed Care – PPO

## 2018-06-06 ENCOUNTER — Other Ambulatory Visit: Payer: Self-pay

## 2018-06-06 DIAGNOSIS — M6281 Muscle weakness (generalized): Secondary | ICD-10-CM

## 2018-06-06 DIAGNOSIS — M25551 Pain in right hip: Secondary | ICD-10-CM

## 2018-06-06 DIAGNOSIS — M25571 Pain in right ankle and joints of right foot: Secondary | ICD-10-CM

## 2018-06-06 DIAGNOSIS — R29898 Other symptoms and signs involving the musculoskeletal system: Secondary | ICD-10-CM

## 2018-06-06 DIAGNOSIS — R262 Difficulty in walking, not elsewhere classified: Secondary | ICD-10-CM

## 2018-06-06 NOTE — Therapy (Signed)
[place] MAIN [facility] SERVICES [address] [place], [place], [address] Phone: [phone]   Fax:  [phone]    Progress Note Reporting Period  04/12/18 to 06/06/18  See note below for Objective Data and Assessment of Progress/Goals.      Physical Therapy Treatment  Patient Details  Name: [name] MRN: [id number] Date of Birth: 01-20-1966 Referring Provider (PT): Pleas Koch   Encounter Date: 06/06/2018  PT End of Session - 06/06/18 1310    Visit Number  20    Number of Visits  33    Date for PT Re-Evaluation  07/25/18    PT Start Time  1301    PT Stop Time  1341    PT Time Calculation (min)  40 min    Equipment Utilized During Treatment  Gait belt    Activity Tolerance  Patient tolerated treatment well;Patient limited by fatigue    Behavior During Therapy  WFL for tasks assessed/performed       Past Medical History:  Diagnosis Date  . Allergy   . Anemia   . Anxiety   . Arthritis   . Asthma   . Colon polyp   . Complication of anesthesia   . Diabetes mellitus   . Edema   . Fatigue   . Hypertension   . IBS (irritable bowel syndrome)   . Lump in female breast   . Methicillin resistant Staphylococcus aureus in conditions classified elsewhere and of unspecified site   . Migraines   . Morbid obesity ([place])   . Night sweats   . Nonspecific abnormal results of thyroid function study   . Other B-complex deficiencies   . Paresthesias   . PONV (postoperative nausea and vomiting)   . Pure hypercholesterolemia   . Reflux   . Sacral fracture ([place])   . Sinus problem   . Symptomatic states associated with artificial menopause   . Thyroid disease   . Type II or unspecified type diabetes mellitus without mention of complication, not stated as uncontrolled   . Unspecified sleep apnea     Past Surgical History:  Procedure Laterality Date  . abcess removal  1997   MRSA  . ABDOMINAL HYSTERECTOMY  1997   complete in  1997, partial was in 1995  . carpal tunnel release r  07/03/13   Dal[place]orf  . [place]  . CHOLECYSTECTOMY  03/1989  . COLONOSCOPY  03/21/2011   Normal.  [facility].  [name] KNEE SURGERY  2006  . LAPAROSCOPIC GASTRIC BANDING  05/2009  . TONSILLECTOMY AND ADENOIDECTOMY  1974  . TUBAL LIGATION    . ULNAR TUNNEL RELEASE Right 02/05/2018   Procedure: CUBITAL TUNNEL RELEASE/DECOMPRESSION;  Surgeon: Melrose Nakayama, MD;  Location: [place];  Service: Orthopedics;  Laterality: Right;    There were no vitals filed for this visit.  Subjective Assessment - 06/06/18 1305    Subjective  Pt is well this date. She reports her knees have felt remarkably better since last session, she thinks it was the new additional of LAQ or leg press.     Pertinent History   Patient fx her sacrum 9/18 and  she lost sensation to right foot, and she lost ability to DF her right foot. Patient had back surgery 03/2017 , she was sent home. She was walking with no device indoors and she could get out of the house and do the steps. June 18, 2017  her LLE was  getting weaker and the right leg was getting stronger. Patient had follow up with neurosurgery. She was scheduled for a steriod shot Jul 23, 2017, but on May 3 her legs didnt  feel right, she fell 4 times, she fractured her R fibula  on may 3rd, she went to hospital and spent one week. She went to SNF for 1 month. She had another neuro consult, resulting in kDx of  polyneuropathy. Then she had an apt with and [facility] said it was not polyneuropathy, (in [place])  She has a second consultation scheduled Dec 30 for a  Neuromuscular.consult.  She had a scan of the spine T 12-28-17  who then sent her to a thoracic surgeon at the end of November and was recommended to not due surgery. She does not know what her Dx is and she has been having home PT beginning August 25, 2017 . She has had HHPT 2 x  week for the last 5 months. She is able to walk 50 feet with RW. She is able to transfer  from wc to stand independently.     Currently in Pain?  No/denies        INTERVENTION THIS DATE:   Neuromuscular Reeducation -Seated LAQ 2x15 LAQ 2x15 bilat @ 2.5lb  -STS transfers from [facility] with BUE on // bars x3 -retro AMB in // bars 1x76f c 8x 180 degree turns  -lateral stepping in // bars 2x368fbilat, supervision    Therapeutic Exercise -Leg Press (seat hole 6): RLE extension 3x15 '@50lbs'$   -Leg Press (seat hole 6): LLE extension 3x15 @ 30lbs -Stand Pivot Transfer WC to/from leg press 2x, MinGuard assist for WC block, pt using Standard Walker   PT Short Term Goals - 03/07/18 1345      PT SHORT TERM GOAL #1   Title  Patient will be independent in home exercise program to improve strength/mobility for better functional independence with ADLs.    Time  6    Period  Weeks    Status  New    Target Date  04/18/18        PT Long Term Goals - 06/06/18 1345      PT LONG TERM GOAL #1   Title  Patient will increase 10 meter walk test to >.50 m/s as to improve gait speed for better community ambulation and to reduce fall risk.    Baseline  defered until next visit     Time  8    Period  Weeks    Status  Partially Met    Target Date  06/27/18      PT LONG TERM GOAL #2   Title  Patient will transfer sit to stand  from 19 inch mat without assist or anyone supporting AD  to demonstrate improved LE strength to decrease falls risk.     Time  12    Period  Weeks    Status  Achieved    Target Date  06/27/18      PT LONG TERM GOAL #3   Title  Patient will demonstrate stance without AD  x 1 min to demonstrate decreased falls risk.     Baseline  deferred until next visit    Time  12    Period  Weeks    Status  Partially Met    Target Date  06/27/18      PT LONG TERM GOAL #4   Title  Patient will  ambulate 500 feet to work towards community ambulation  distances.     Baseline  defered until next visit     Time  12    Period  Weeks    Status  Partially Met    Target Date   06/27/18            Plan - 06/06/18 1319    Clinical Impression Statement  Continued with plan of care as laid out in evaluation and additional prior episodes. Pt able to perform session in full with intermittent rest breaks offered PRN. Pt requires only minimal verbal and cues intermittently for exercise instruction and correct form, some physical assist for foot placement on leg press. Pt continues to demonstrate gradual progress toward goals AEB ability to tolerate progression in reps and/or sets this date. Pt increased distance for retroAMB this session by 100%.  Pt demonstrating vast improvements since last reassmessment with transfers independence and tolerance ot multiplane AMB, as well as increased hip strength (able to increase reps this date by 5 on BLE).     Rehab Potential  Fair    Clinical Impairments Affecting Rehab Potential  Multiple tests concerning for neurologic involvement     PT Frequency  2x / week    PT Duration  12 weeks    PT Treatment/Interventions  Dry needling;Balance training;Neuromuscular re-education;Gait training;DME Instruction;Aquatic Therapy;Cryotherapy;Electrical Stimulation;Ultrasound;Moist Heat;Iontophoresis '4mg'$ /ml Dexamethasone;Stair training;Functional mobility training;Therapeutic activities;Therapeutic exercise;Orthotic Fit/Training;Patient/family education;Manual techniques;Wheelchair mobility training    PT Next Visit Plan  Progress quadricep/hamstring strengthening in [place], posterior hip musculature strengthening as tolerated.     PT Home Exercise Plan  No updates this date     Consulted and Agree with Plan of Care  Patient;Family member/caregiver    Family Member Consulted  Spouse        Patient will benefit from skilled therapeutic intervention in order to improve the following deficits and impairments:  Abnormal gait, Pain, Decreased endurance, Decreased activity tolerance, Decreased strength, Obesity, Difficulty walking, Decreased balance,  Decreased safety awareness, Increased edema, Impaired sensation, Decreased knowledge of use of DME  Visit Diagnosis: Muscle weakness (generalized)  Difficulty in walking, not elsewhere classified  Pain in right ankle and joints of right foot  Weakness of right leg  Pain in right hip     Problem List Patient Active Problem List   Diagnosis Date Noted  . Migraine without status migrainosus, not intractable   . Chronic pain syndrome   . Lumbar disc disease with radiculopathy 07/21/2017  . Lumbar foraminal stenosis 07/21/2017  . Unable to walk 07/21/2017  . Multiple falls 07/21/2017  . Displaced fracture of proximal end of right fibula 07/21/2017  . Lower extremity weakness 05/28/2017  . Right lower quadrant abdominal pain 08/22/2016  . Abnormal TSH 01/10/2016  . Morbid obesity ([place]) 01/12/2014  . Routine general medical examination at a health care facility 10/28/2012  . GERD (gastroesophageal reflux disease) 10/28/2012  . Anxiety 12/20/2011  . Allergic rhinitis 12/20/2011  . Asthma 12/20/2011  . Lapband APL with HH repair 03/31/2011  . Uncontrolled type 2 diabetes mellitus with hyperglycemia, with long-term current use of insulin ([place]) 06/30/2007  . Hyperlipidemia 06/30/2007  . Essential hypertension 06/27/2007   1:48 PM, 06/06/18 Etta Grandchild, PT, DPT Physical Therapist - [place] [phone]     Etta Grandchild 06/06/2018, 1:48 PM  [place] MAIN [facility] SERVICES [address] [place], [place], [address] Phone: [phone]   Fax:  [phone]  Name: AJAHNAE RATHGEBER  MRN: [id number] Date of Birth: [birth year]

## 2018-06-09 ENCOUNTER — Other Ambulatory Visit: Payer: Self-pay

## 2018-06-09 DIAGNOSIS — Z794 Long term (current) use of insulin: Principal | ICD-10-CM

## 2018-06-09 DIAGNOSIS — E119 Type 2 diabetes mellitus without complications: Secondary | ICD-10-CM

## 2018-06-10 ENCOUNTER — Encounter: Payer: Self-pay | Admitting: Physical Therapy

## 2018-06-10 NOTE — Therapy (Signed)
[place] MAIN [facility] SERVICES [address] [place], [place], [address] Phone: [phone]   Fax:  [phone]  Patient Details  Name: [name] MRN: [id number] Date of Birth: 1965-10-19 Referring Provider:  No ref. provider found  Encounter Date: 06/10/2018  Patient was called to inform that the clinic is closed due to the pandemic and that she will be called when our clinic re opens Arelia Sneddon S,PT DPT  06/10/2018, 4:24 PM  [place] [address] [place], [place], [address] Phone: [phone]   Fax:  [phone]

## 2018-06-11 ENCOUNTER — Ambulatory Visit: Payer: BC Managed Care – PPO | Admitting: Physical Therapy

## 2018-06-11 MED ORDER — INSULIN DETEMIR 100 UNIT/ML FLEXPEN: 38.0000 [IU] | PEN_INJECTOR | Freq: Two times a day (BID) | SUBCUTANEOUS | 2 refills | Status: DC

## 2018-06-11 NOTE — Telephone Encounter (Signed)
Refill sent to pharmacy.   

## 2018-06-13 ENCOUNTER — Ambulatory Visit: Payer: BC Managed Care – PPO | Admitting: Physical Therapy

## 2018-06-13 ENCOUNTER — Ambulatory Visit: Payer: BC Managed Care – PPO | Admitting: Primary Care

## 2018-06-18 ENCOUNTER — Ambulatory Visit: Payer: BC Managed Care – PPO | Admitting: Physical Therapy

## 2018-06-20 ENCOUNTER — Ambulatory Visit: Payer: BC Managed Care – PPO | Admitting: Physical Therapy

## 2018-06-25 ENCOUNTER — Ambulatory Visit: Payer: BC Managed Care – PPO | Admitting: Physical Therapy

## 2018-06-26 ENCOUNTER — Encounter: Payer: Self-pay | Admitting: Physical Therapy

## 2018-06-26 NOTE — Therapy (Signed)
[place] MAIN [facility] SERVICES [address] [place], [place], [address] Phone: [phone]   Fax:  [phone]  Patient Details  Name: [name] MRN: [id number] Date of Birth: 1965/12/20 Referring Provider:  No ref. provider found  Encounter Date: 06/26/2018  Patient called to ask about interest in tele health PT over the computer as an e visit. She is not interested in this at this time. She is doing her HEP.    [address] , [place] DPT 06/26/2018, 3:18 PM  [place] MAIN [facility] SERVICES [address] [place], [place], [address] Phone: [phone]   Fax:  [phone]

## 2018-06-27 ENCOUNTER — Ambulatory Visit: Payer: BC Managed Care – PPO | Admitting: Physical Therapy

## 2018-07-02 ENCOUNTER — Ambulatory Visit: Payer: BC Managed Care – PPO | Admitting: Physical Therapy

## 2018-07-02 ENCOUNTER — Other Ambulatory Visit: Payer: Self-pay | Admitting: Primary Care

## 2018-07-02 DIAGNOSIS — I10 Essential (primary) hypertension: Secondary | ICD-10-CM

## 2018-07-04 ENCOUNTER — Ambulatory Visit: Payer: BC Managed Care – PPO | Admitting: Physical Therapy

## 2018-07-09 ENCOUNTER — Ambulatory Visit: Payer: BC Managed Care – PPO | Admitting: Physical Therapy

## 2018-07-11 ENCOUNTER — Ambulatory Visit: Payer: BC Managed Care – PPO | Admitting: Physical Therapy

## 2018-07-11 ENCOUNTER — Ambulatory Visit (INDEPENDENT_AMBULATORY_CARE_PROVIDER_SITE_OTHER): Payer: BC Managed Care – PPO | Admitting: Primary Care

## 2018-07-11 DIAGNOSIS — R29898 Other symptoms and signs involving the musculoskeletal system: Secondary | ICD-10-CM

## 2018-07-11 DIAGNOSIS — Z794 Long term (current) use of insulin: Secondary | ICD-10-CM

## 2018-07-11 DIAGNOSIS — E782 Mixed hyperlipidemia: Secondary | ICD-10-CM

## 2018-07-11 DIAGNOSIS — E1165 Type 2 diabetes mellitus with hyperglycemia: Secondary | ICD-10-CM

## 2018-07-11 DIAGNOSIS — R000 Tachycardia, unspecified: Secondary | ICD-10-CM

## 2018-07-11 DIAGNOSIS — I10 Essential (primary) hypertension: Secondary | ICD-10-CM

## 2018-07-11 MED ORDER — METOPROLOL TARTRATE 25 MG PO TABS: 12.5000 mg | ORAL_TABLET | Freq: Two times a day (BID) | ORAL | 1 refills | Status: DC

## 2018-07-11 NOTE — Assessment & Plan Note (Signed)
Repeat A1C pending. Glucose readings do appear to have improved. Strongly advised she start checking glucose levels three times daily before meals, especially when injecting Novolog.   Managed on statin and ARB. Pneumonia vaccination UTD. Foot exam UTD. She will schedule an eye exam.  Follow up in 3-6 months based off of A1C.

## 2018-07-11 NOTE — Patient Instructions (Signed)
You must check your glucose levels at least three times daily before every meal. Write down your readings and notify me if you see readings consistently at or above 200.  We will be in touch regarding a lab only appointment. Make sure not to eat four hours prior.  Continue to work on [employer], continue to work on some walking.  It was a pleasure to see you today! [name], NP-C

## 2018-07-11 NOTE — Progress Notes (Signed)
Subjective:    Patient ID: [name], female    DOB: [birth year], 53 y.o.   MRN: [id number]  HPI  Virtual Visit via Video Note  I connected with [name] on 07/11/18 at  8:00 AM EDT by a video enabled telemedicine application and verified that I am speaking with the correct person using two identifiers.   I discussed the limitations of evaluation and management by telemedicine and the availability of in person appointments. The patient expressed understanding and agreed to proceed. She is at home, I am in the office.   We attempted video for this visit and the video ended up failing. The camera on her Ipad and phone wouldn't support the program. We completed the rest of our visit via phone.  History of Present Illness:  [name] is a 53 year old female who presents today for follow up of diabetes.  Current medications include: Glipizide ER 10 mg daily, Trulicity 1.5 mg weekly, Levemir 38 units BID, Novolog 18 units TID with meals. She is compliant to her diabetic regimen.  She is checking her blood glucose 1 times daily and is getting readings of:  AM fasting: 160 today, 120-150 Before lunch: 110 Before dinner: 160-170  Last A1C: 10.3 in November 2019 Last Eye Exam: No recent exam Last Foot Exam: Due in December 2020 Pneumonia Vaccination: Completed in 2018 ACE/ARB: Losartan  Statin: atorvastatin   Diet currently consists of:   Breakfast: Berniece Salines, eggs Lunch: Sandwiches, protein shakes Dinner: Meat, vegetables  Snacks: Popcorn  Desserts: Daily  Beverages: Diet soda mostly, little water  Exercise: She is not exercising.    Observations/Objective:  Alert and oriented. Resting calmly. No distress.  Assessment and Plan:  See problem based charting.  Follow Up Instructions:  You must check your glucose levels at least three times daily before every meal. Write down your readings and notify me if you see readings consistently at or above 200.  We will  be in touch regarding a lab only appointment. Make sure not to eat four hours prior.  Continue to work on [employer], continue to work on some walking.  It was a pleasure to see you today! Maria Bossier, NP-C    I discussed the assessment and treatment plan with the patient. The patient was provided an opportunity to ask questions and all were answered. The patient agreed with the plan and demonstrated an understanding of the instructions.   The patient was advised to call back or seek an in-person evaluation if the symptoms worsen or if the condition fails to improve as anticipated.     Maria Koch, NP    Review of Systems  Eyes: Negative for visual disturbance.  Respiratory: Negative for shortness of breath.   Cardiovascular: Negative for chest pain.  Neurological: Negative for dizziness.       Chronic numbness to lower extremities       Past Medical History:  Diagnosis Date   Allergy    Anemia    Anxiety    Arthritis    Asthma    Colon polyp    Complication of anesthesia    Diabetes mellitus    Edema    Fatigue    Hypertension    IBS (irritable bowel syndrome)    Lump in female breast    Methicillin resistant Staphylococcus aureus in conditions classified elsewhere and of unspecified site    Migraines    Morbid obesity (HCC)    Night sweats  Nonspecific abnormal results of thyroid function study    Other B-complex deficiencies    Paresthesias    PONV (postoperative nausea and vomiting)    Pure hypercholesterolemia    Reflux    Sacral fracture (HCC)    Sinus problem    Symptomatic states associated with artificial menopause    Thyroid disease    Type II or unspecified type diabetes mellitus without mention of complication, not stated as uncontrolled    Unspecified sleep apnea      Social History   Socioeconomic History   Marital status: Married    Spouse name: Not on file   Number of children: 3   Years of  education: college   Highest education level: Not on file  Occupational History   Occupation: Data processing manager: [employer] strain: Not on file   Food insecurity:    Worry: Not on file    Inability: Not on file   Transportation needs:    Medical: Not on file    Non-medical: Not on file  Tobacco Use   Smoking status: Never Smoker   Smokeless tobacco: Never Used  Substance and Sexual Activity   Alcohol use: Yes    Alcohol/week: 0.0 standard drinks    Comment: rarely - once per week at most-nothing for the past year 01/31/18   Drug use: No   Sexual activity: Never    Birth control/protection: None  Lifestyle   Physical activity:    Days per week: Not on file    Minutes per session: Not on file   Stress: Not on file  Relationships   Social connections:    Talks on phone: Not on file    Gets together: Not on file    Attends religious service: Not on file    Active member of club or organization: Not on file    Attends meetings of clubs or organizations: Not on file    Relationship status: Not on file   Intimate partner violence:    Fear of current or ex partner: Not on file    Emotionally abused: Not on file    Physically abused: Not on file    Forced sexual activity: Not on file  Other Topics Concern   Not on file  Social History Narrative   Exercise: walking two days per week, 30 minutes.       Married x 29 years, happily married; no abuse.       Children: 3 children; 1 grandchild; two step grandchildren; 1 gg.      Lives: with husband, mother-in-law.      Employment:  [employer] x 1996; Product manager.  [employer] work.      Tobacco: never      Alcohol: socially.  One glass of wine per month.      Exercise:  Walking around campus.        Seatbelt: 100%      Guns: gun safe in garage.    Past Surgical History:  Procedure Laterality Date   abcess removal  1997   MRSA   ABDOMINAL HYSTERECTOMY  1997    complete in 1997, partial was in 1995   carpal tunnel release r  07/03/13   [place]  03/1989   COLONOSCOPY  03/21/2011   Normal.  [facility].   KNEE SURGERY  2006   LAPAROSCOPIC GASTRIC BANDING  05/2009  TONSILLECTOMY AND ADENOIDECTOMY  1974   TUBAL LIGATION     ULNAR TUNNEL RELEASE Right 02/05/2018   Procedure: CUBITAL TUNNEL RELEASE/DECOMPRESSION;  Surgeon: Melrose Nakayama, MD;  Location: [place];  Service: Orthopedics;  Laterality: Right;    Family History  Problem Relation Age of Onset   Diabetes Mother    Heart disease Mother 66       CHF, CAD   Other Mother        muscle disease   Hypertension Mother    Hyperlipidemia Mother    Arthritis Mother        OA hip L s/p THR   Diabetes Father    Heart disease Father        AMI/CABG/valve replacement age 81.   Stroke Father    Diabetes Brother    Heart disease Brother        stent age 72.   Fibromyalgia Daughter    Crohn's disease Daughter    Hypertension Son    Alzheimer's disease Maternal Grandmother    Emphysema Maternal Grandfather    Heart disease Paternal Grandmother    Breast cancer Unknown        2 Aunts    Allergies  Allergen Reactions   Amoxicillin Shortness Of Breath and Rash   Bee Venom Anaphylaxis   Levofloxacin Shortness Of Breath and Rash   Meloxicam Hives and Rash   Vancomycin Anaphylaxis   Sulfa Antibiotics Rash   Ace Inhibitors Cough   Codeine    Meperidine Hcl    Oxycodone-Acetaminophen    Penicillins Swelling and Rash    Has patient had a PCN reaction causing immediate rash, facial/tongue/throat swelling, SOB or lightheadedness with hypotension: Yes Has patient had a PCN reaction causing severe rash involving mucus membranes or skin necrosis: Yes Has patient had a PCN reaction that required hospitalization: No Has patient had a PCN reaction occurring within the last 10 years: No If all of the above  answers are "NO", then may proceed with Cephalosporin use.    Tequin [Gatifloxacin] Rash    Current Outpatient Medications on File Prior to Visit  Medication Sig Dispense Refill   acetaminophen (TYLENOL) 650 MG CR tablet Take 650 mg by mouth at bedtime.     albuterol (PROVENTIL HFA;VENTOLIN HFA) 108 (90 Base) MCG/ACT inhaler Inhale 1-2 puffs into the lungs every 6 (six) hours as needed for wheezing or shortness of breath. 1 Inhaler 0   atorvastatin (LIPITOR) 40 MG tablet Take 1 tablet (40 mg total) by mouth at bedtime. 90 tablet 0   Blood Glucose Monitoring Suppl (ONE TOUCH ULTRA MINI) w/Device KIT Use as instructed to test blood sugar 5 times daily. 1 each 0   Continuous Blood Gluc Sensor (FREESTYLE LIBRE 14 DAY SENSOR) MISC 1 each by Does not apply route every 14 (fourteen) days. 2 each 0   cyclobenzaprine (FLEXERIL) 10 MG tablet TAKE 1 TABLET BY MOUTH THREE TIMES A DAY AS NEEDED FOR MUSCLE SPASMS 270 tablet 1   Dulaglutide (TRULICITY) 1.5 TD/3.2KG SOPN Inject into the skin once weekly for diabetes. 8 mL 3   FLUoxetine (PROZAC) 40 MG capsule Take 1 capsule (40 mg total) by mouth daily. 90 capsule 3   fluticasone (FLONASE) 50 MCG/ACT nasal spray Place 2 sprays into both nostrils daily. 16 g 11   gabapentin (NEURONTIN) 300 MG capsule Take 2 capsules (600 mg total) by mouth 2 (two) times daily. 360 capsule 3   glipiZIDE (GLUCOTROL XL) 10 MG 24 hr tablet Take 1  tablet (10 mg total) by mouth daily with breakfast. For diabetes. 90 tablet 3   glucose blood test strip ONE TOUCH ULTRA MINI Use as instructed to test blood sugar 5 times daily. 200 each 5   hydrochlorothiazide (HYDRODIURIL) 25 MG tablet TAKE 1/2 TABLET (12.5 MG) BY MOUTH EVERY DAY. 45 tablet 1   HYDROcodone-acetaminophen (NORCO/VICODIN) 5-325 MG tablet Take 1-2 tablets by mouth every 6 (six) hours as needed for moderate pain. 20 tablet 0   insulin aspart (NOVOLOG FLEXPEN) 100 UNIT/ML FlexPen Inject 18 Units into the skin 3  (three) times daily with meals. 15 mL 2   Insulin Detemir (LEVEMIR FLEXTOUCH) 100 UNIT/ML Pen Inject 38 Units into the skin 2 (two) times daily. 45 mL 2   Insulin Pen Needle 32G X 4 MM MISC USE AS DIRECTED FOR INSULIN INJECTION DAILY 100 each 5   ketoconazole (NIZORAL) 2 % cream Apply topically once daily as needed for irritation. 60 g 0   Lancets (ONETOUCH ULTRASOFT) lancets Use as instructed to test blood sugar 3 times daily 100 each 5   levocetirizine (XYZAL) 5 MG tablet TAKE ONE TABLET BY MOUTH EVERY EVENING (Patient taking differently: TAKE ONE TABLET (5 mg)  BY MOUTH EVERY EVENING) 90 tablet 1   losartan (COZAAR) 100 MG tablet Take 0.5 tablets (50 mg total) by mouth daily. 90 tablet 1   metoprolol tartrate (LOPRESSOR) 25 MG tablet Take 0.5 tablets (12.5 mg total) by mouth 2 (two) times daily. For blood pressure. 90 tablet 3   Multiple Vitamin (MULTIVITAMIN PO) Take 1 tablet by mouth daily.      ondansetron (ZOFRAN-ODT) 4 MG disintegrating tablet TAKE 1 TABLET BY MOUTH EVERY 8 HOURS AS NEEDED FOR NAUSEA AS DIRECTED. (Patient taking differently: Take 4 mg by mouth every 8 (eight) hours as needed for nausea or vomiting. TAKE 1 TABLET BY MOUTH EVERY 8 HOURS AS NEEDED FOR NAUSEA AS DIRECTED.) 20 tablet 0   VALERIAN ROOT PO Take 1,000 mg by mouth at bedtime.     No current facility-administered medications on file prior to visit.     There were no vitals taken for this visit.   Objective:   Physical Exam  Constitutional: She is oriented to person, place, and time. She appears well-nourished.  Respiratory: Effort normal.  Neurological: She is alert and oriented to person, place, and time.  Skin: Skin is dry.  Psychiatric: She has a normal mood and affect.           Assessment & Plan:

## 2018-07-11 NOTE — Assessment & Plan Note (Signed)
Improved and is now able to ambulate 200 feet at a time with lower extremity braces. Working with [facility] often.

## 2018-07-11 NOTE — Assessment & Plan Note (Signed)
Compliant to atorvastatin, repeat lipids pending. 

## 2018-07-14 ENCOUNTER — Other Ambulatory Visit: Payer: Self-pay

## 2018-07-14 DIAGNOSIS — R238 Other skin changes: Secondary | ICD-10-CM

## 2018-07-15 ENCOUNTER — Other Ambulatory Visit (INDEPENDENT_AMBULATORY_CARE_PROVIDER_SITE_OTHER): Payer: BC Managed Care – PPO

## 2018-07-15 ENCOUNTER — Other Ambulatory Visit: Payer: Self-pay

## 2018-07-15 DIAGNOSIS — Z794 Long term (current) use of insulin: Secondary | ICD-10-CM

## 2018-07-15 DIAGNOSIS — E1165 Type 2 diabetes mellitus with hyperglycemia: Secondary | ICD-10-CM | POA: Diagnosis not present

## 2018-07-15 DIAGNOSIS — E782 Mixed hyperlipidemia: Secondary | ICD-10-CM

## 2018-07-15 LAB — COMPREHENSIVE METABOLIC PANEL
ALT: 24 U/L (ref 0–35)
AST: 23 U/L (ref 0–37)
Albumin: 3.7 g/dL (ref 3.5–5.2)
Alkaline Phosphatase: 54 U/L (ref 39–117)
BUN: 18 mg/dL (ref 6–23)
CO2: 27 mEq/L (ref 19–32)
Calcium: 8.9 mg/dL (ref 8.4–10.5)
Chloride: 102 mEq/L (ref 96–112)
Creatinine, Ser: 0.81 mg/dL (ref 0.40–1.20)
GFR: 73.67 mL/min (ref >60.00)
Glucose, Bld: 199 mg/dL — ABNORMAL HIGH (ref 70–99)
Potassium: 4.6 mEq/L (ref 3.5–5.1)
Sodium: 140 mEq/L (ref 135–145)
Total Bilirubin: 0.4 mg/dL (ref 0.2–1.2)
Total Protein: 6.6 g/dL (ref 6.0–8.3)

## 2018-07-15 LAB — LIPID PANEL
Cholesterol: 220 mg/dL — ABNORMAL HIGH (ref 0–200)
HDL: 44.2 mg/dL (ref >39.00)
NonHDL: 175.51
Total CHOL/HDL Ratio: 5
Triglycerides: 293 mg/dL — ABNORMAL HIGH (ref 0.0–149.0)
VLDL: 58.6 mg/dL — ABNORMAL HIGH (ref 0.0–40.0)

## 2018-07-15 LAB — HEMOGLOBIN A1C: Hgb A1c MFr Bld: 9.4 % — ABNORMAL HIGH (ref 4.6–6.5)

## 2018-07-15 LAB — LDL CHOLESTEROL, DIRECT: Direct LDL: 112 mg/dL

## 2018-07-15 MED ORDER — KETOCONAZOLE 2 % EX CREA: TOPICAL_CREAM | CUTANEOUS | 0 refills | Status: DC

## 2018-07-16 ENCOUNTER — Ambulatory Visit: Payer: BC Managed Care – PPO | Admitting: Physical Therapy

## 2018-07-16 DIAGNOSIS — E119 Type 2 diabetes mellitus without complications: Secondary | ICD-10-CM

## 2018-07-16 DIAGNOSIS — Z794 Long term (current) use of insulin: Principal | ICD-10-CM

## 2018-07-16 MED ORDER — INSULIN DETEMIR 100 UNIT/ML FLEXPEN: 40.0000 [IU] | PEN_INJECTOR | Freq: Two times a day (BID) | SUBCUTANEOUS | 2 refills | Status: DC

## 2018-07-18 ENCOUNTER — Ambulatory Visit: Payer: BC Managed Care – PPO | Admitting: Physical Therapy

## 2018-07-31 ENCOUNTER — Other Ambulatory Visit: Payer: Self-pay | Admitting: Primary Care

## 2018-07-31 DIAGNOSIS — E7800 Pure hypercholesterolemia, unspecified: Secondary | ICD-10-CM

## 2018-08-06 ENCOUNTER — Encounter: Payer: Self-pay | Admitting: Physical Therapy

## 2018-08-06 ENCOUNTER — Ambulatory Visit: Payer: BC Managed Care – PPO | Attending: Primary Care | Admitting: Physical Therapy

## 2018-08-06 ENCOUNTER — Other Ambulatory Visit: Payer: Self-pay

## 2018-08-06 DIAGNOSIS — R29898 Other symptoms and signs involving the musculoskeletal system: Secondary | ICD-10-CM | POA: Diagnosis present

## 2018-08-06 DIAGNOSIS — R262 Difficulty in walking, not elsewhere classified: Secondary | ICD-10-CM | POA: Insufficient documentation

## 2018-08-06 DIAGNOSIS — M6281 Muscle weakness (generalized): Secondary | ICD-10-CM | POA: Insufficient documentation

## 2018-08-06 DIAGNOSIS — M25571 Pain in right ankle and joints of right foot: Secondary | ICD-10-CM | POA: Diagnosis present

## 2018-08-06 NOTE — Therapy (Signed)
[place] MAIN [facility] SERVICES [address] [place], [place], [address] Phone: [phone]   Fax:  [phone]  Physical Therapy Evaluation  Patient Details  Name: [name] MRN: [id number] Date of Birth: [birth year] Referring Provider (PT): Cornell Barman   Encounter Date: 08/06/2018  PT End of Session - 08/06/18 1120    Visit Number  1    Number of Visits  25    Date for PT Re-Evaluation  10/29/18    PT Start Time  1100    PT Stop Time  1145    PT Time Calculation (min)  45 min    Equipment Utilized During Treatment  Gait belt    Activity Tolerance  Patient tolerated treatment well;Patient limited by fatigue       Past Medical History:  Diagnosis Date  . Allergy   . Anemia   . Anxiety   . Arthritis   . Asthma   . Colon polyp   . Complication of anesthesia   . Diabetes mellitus   . Edema   . Fatigue   . Hypertension   . IBS (irritable bowel syndrome)   . Lump in female breast   . Methicillin resistant Staphylococcus aureus in conditions classified elsewhere and of unspecified site   . Migraines   . Morbid obesity ([place])   . Night sweats   . Nonspecific abnormal results of thyroid function study   . Other B-complex deficiencies   . Paresthesias   . PONV (postoperative nausea and vomiting)   . Pure hypercholesterolemia   . Reflux   . Sacral fracture ([place])   . Sinus problem   . Symptomatic states associated with artificial menopause   . Thyroid disease   . Type II or unspecified type diabetes mellitus without mention of complication, not stated as uncontrolled   . Unspecified sleep apnea     Past Surgical History:  Procedure Laterality Date  . abcess removal  1997   MRSA  . ABDOMINAL HYSTERECTOMY  1997   complete in 1997, partial was in 1995  . carpal tunnel release r  07/03/13   [place]  . [place]  . CHOLECYSTECTOMY  03/1989  . COLONOSCOPY  03/21/2011   Normal.  [facility].  [name] KNEE  SURGERY  2006  . LAPAROSCOPIC GASTRIC BANDING  05/2009  . TONSILLECTOMY AND ADENOIDECTOMY  1974  . TUBAL LIGATION    . ULNAR TUNNEL RELEASE Right 02/05/2018   Procedure: CUBITAL TUNNEL RELEASE/DECOMPRESSION;  Surgeon: Melrose Nakayama, MD;  Location: [place];  Service: Orthopedics;  Laterality: Right;    There were no vitals filed for this visit.   Subjective Assessment - 08/06/18 1110    Subjective  Patient has been working on her HEP to get her LE's stronger. She had a fall 1 week ago, 08/02/18.    Patient is accompained by:  Family member    Pertinent History   Patient fx her sacrum 9/18 and  she lost sensation to right foot, and she lost ability to DF her right foot. Patient had back surgery 03/2017 , she was sent home. She was walking with no device indoors and she could get out of the house and do the steps. June 18, 2017  her LLE was getting weaker and the right leg was getting stronger. Patient had follow up with neurosurgery. She was scheduled for a steriod shot Jul 23, 2017, but on May 3 her legs didnt  feel right, she  fell 4 times, she fractured her R fibula  on may 3rd, she went to hospital and spent one week. She went to SNF for 1 month. She had another neuro consult, resulting in kDx of  polyneuropathy. Then she had an apt with and [facility] said it was not polyneuropathy, (in [place])  She has a second consultation scheduled Dec 30 for a  Neuromuscular.consult.  She had a scan of the spine T 12-28-17  who then sent her to a thoracic surgeon at the end of November and was recommended to not due surgery. She does not know what her Dx is and she has been having home PT beginning August 25, 2017 . She has had HHPT 2 x  week for the last 5 months. She is able to walk 50 feet with RW. She is able to transfer from wc to stand independently.     How long can you stand comfortably?  5 mins    Patient Stated Goals  to walk without the RW, or walk better, use the bathroom    Pain Onset  1 to 4 weeks ago          [facility] PT Assessment - 08/06/18 1111      Assessment   Medical Diagnosis  weakness    Referring Provider (PT)  clark Kathrine    Onset Date/Surgical Date  03/07/18    Hand Dominance  Right    Next MD Visit  08/07/18    Prior Therapy  HHPT 5 months      Precautions   Precautions  None    Required Braces or Orthoses  Other Brace/Splint      Restrictions   Weight Bearing Restrictions  No      Balance Screen   Has the patient fallen in the past 6 months  Yes    How many times?  1    Has the patient had a decrease in activity level because of a fear of falling?   Yes    Is the patient reluctant to leave their home because of a fear of falling?   Yes      [place] residence    Living Arrangements  Spouse/significant other    Available Help at [place] entrance    [place]  One level    [place] - 2 wheels;Bedside commode;Shower seat;Wheelchair - manual      Prior Function   Level of Independence  Needs assistance with ADLs;Needs assistance with homemaking;Needs assistance with transfers    Vocation  Retired    Leisure  plays with grandchildren      Cognition   Overall Cognitive Status  Within Functional Limits for tasks assessed        PAIN: Patient has pain in right ankle   POSTURE: WNL   PROM/AROM:WFL BUE and BLE  Hip Flex 1/5 -3/5  Hip Abd -3/5 1/5  Hip Add 0/5 1/5  Hip Ext -3/5 1/5  Hip IR/ER 2/5 0/5  Knee Flex 3/5 0/5  Knee Ext 3/5 4/5  Ankle DF -3/5 0/5  Ankle PF 3/5 0/5     STRENGTH:  Graded on a 0-5 scale Muscle Group Left Right                          Hip Flex    Hip Abd    Hip Add  Hip Ext    Hip IR/ER    Knee Flex    Knee Ext    Ankle DF    Ankle PF     SENSATION: : RLE decreased sensation to right foot light touch/deep touch  LLE sensation is WNL  FUNCTIONAL MOBILITY: min assist for transfer wc to stand, independent for rolling left  and right   BALANCE: Static Standing Balance  Normal Able to maintain standing balance against maximal resistance   Good Able to maintain standing balance against moderate resistance   Good-/Fair+ Able to maintain standing balance against minimal resistance   Fair Able to stand unsupported without UE support and without LOB for 1-2 min x  Fair- Requires Min A and UE support to maintain standing without loss of balance   Poor+ Requires mod A and UE support to maintain standing without loss of balance   Poor Requires max A and UE support to maintain standing balance without loss    Standing Dynamic Balance  Normal Stand independently unsupported, able to weight shift and cross midline maximally   Good Stand independently unsupported, able to weight shift and cross midline moderately   Good-/Fair+ Stand independently unsupported, able to weight shift across midline minimally   Fair Stand independently unsupported, weight shift, and reach ipsilaterally, loss of balance when crossing midline   Poor+ Able to stand with Min A and reach ipsilaterally, unable to weight shift x  Poor Able to stand with Mod A and minimally reach ipsilaterally, unable to cross midline.       GAIT: ambulates with B short leg braces and RW for 300 feet with SBA and unsteady gait  OUTCOME MEASURES: TEST Outcome Interpretation  5 times sit<>stand 51.33 sec( mod assist for 2 stand) >60 yo, >15 sec indicates increased risk for falls  10 meter walk test   . 32             m/s <1.0 m/s indicates increased risk for falls; limited community ambulator      6 minute walk test         300       Feet 1000 feet is community ambulator                    Objective measurements completed on examination: See above findings.              PT Education - 08/06/18 1117    Education provided  Yes    Education Details  Plan of care    Person(s) Educated  Patient    Methods  Explanation    Comprehension   Verbalized understanding       PT Short Term Goals - 03/07/18 1345      PT SHORT TERM GOAL #1   Title  Patient will be independent in home exercise program to improve strength/mobility for better functional independence with ADLs.    Time  6    Period  Weeks    Status  New    Target Date  04/18/18        PT Long Term Goals - 08/06/18 1124      PT LONG TERM GOAL #1   Title  Patient will increase 10 meter walk test to >.50 m/s as to improve gait speed for better community ambulation and to reduce fall risk.    Time  8    Period  Weeks    Status  New    Target Date  10/29/18  PT LONG TERM GOAL #2   Title  Patient will transfer sit to stand  from 19 inch mat without assist or anyone supporting AD  to demonstrate improved LE strength to decrease falls risk.     Time  12    Period  Weeks    Status  New    Target Date  10/29/18      PT LONG TERM GOAL #3   Title  Patient will demonstrate stance without AD  x 1 min to demonstrate decreased falls risk.     Time  12    Period  Weeks    Status  New    Target Date  10/29/18      PT LONG TERM GOAL #4   Title  Patient will  ambulate 500 feet to work towards community ambulation distances.     Time  12    Period  Weeks    Status  New    Target Date  10/29/18             Plan - 08/06/18 1121    Clinical Impression Statement  Patient is 38 yr. old female with BLE weakness, BLE ankle AFO's, independent for bed mobility sit to supine, and is SBA assist with transfers using elevated surface or chair with arms. Patient ambulates with B AFO and RW 300 feet with wc following behind and irratic , uneven stepping. She has poor static standing balance and poor dynamic standing balance. She lives with her husband who assists with dressing , bathing, and ADL's. Patient has decreased gait speed, increased falls risk and poor mobility and will benefit from skilled PT to improve quality of life and improve mobility.    Clinical Decision  Making  Moderate    Rehab Potential  Good    PT Frequency  2x / week    PT Duration  12 weeks    PT Treatment/Interventions  Gait training;Therapeutic activities;Therapeutic exercise;Balance training;Neuromuscular re-education;Patient/family education;Orthotic Fit/Training;Manual techniques    PT Next Visit Plan  therapeutic exercise    Consulted and Agree with Plan of Care  Patient       Patient will benefit from skilled therapeutic intervention in order to improve the following deficits and impairments:  Decreased balance, Decreased endurance, Decreased mobility, Difficulty walking, Impaired sensation, Decreased range of motion, Decreased activity tolerance, Decreased coordination, Decreased strength, Pain, Postural dysfunction  Visit Diagnosis: Muscle weakness (generalized)  Difficulty in walking, not elsewhere classified  Weakness of right leg     Problem List Patient Active Problem List   Diagnosis Date Noted  . Migraine without status migrainosus, not intractable   . Chronic pain syndrome   . Lumbar disc disease with radiculopathy 07/21/2017  . Lumbar foraminal stenosis 07/21/2017  . Unable to walk 07/21/2017  . Multiple falls 07/21/2017  . Displaced fracture of proximal end of right fibula 07/21/2017  . Lower extremity weakness 05/28/2017  . Right lower quadrant abdominal pain 08/22/2016  . Abnormal TSH 01/10/2016  . Morbid obesity ([place]) 01/12/2014  . Routine general medical examination at a health care facility 10/28/2012  . GERD (gastroesophageal reflux disease) 10/28/2012  . Anxiety 12/20/2011  . Allergic rhinitis 12/20/2011  . Asthma 12/20/2011  . Lapband APL with HH repair 03/31/2011  . Uncontrolled type 2 diabetes mellitus with hyperglycemia, with long-term current use of insulin ([place]) 06/30/2007  . Hyperlipidemia 06/30/2007  . Essential hypertension 06/27/2007    Alanson Puls, PT DPT 08/06/2018, 11:26 AM  [facility]  [place], [place], [address] Phone: [phone]   Fax:  [phone]  Name: [name] MRN: [id number] Date of Birth: [birth year]

## 2018-08-08 ENCOUNTER — Other Ambulatory Visit: Payer: Self-pay

## 2018-08-08 ENCOUNTER — Ambulatory Visit: Payer: BC Managed Care – PPO | Admitting: Physical Therapy

## 2018-08-08 ENCOUNTER — Encounter: Payer: Self-pay | Admitting: Physical Therapy

## 2018-08-08 DIAGNOSIS — M6281 Muscle weakness (generalized): Secondary | ICD-10-CM

## 2018-08-08 DIAGNOSIS — R29898 Other symptoms and signs involving the musculoskeletal system: Secondary | ICD-10-CM

## 2018-08-08 DIAGNOSIS — M25571 Pain in right ankle and joints of right foot: Secondary | ICD-10-CM

## 2018-08-08 DIAGNOSIS — R262 Difficulty in walking, not elsewhere classified: Secondary | ICD-10-CM

## 2018-08-08 NOTE — Therapy (Signed)
[place] MAIN [facility] SERVICES [address] [place], [place], [address] Phone: [phone]   Fax:  [phone]  Physical Therapy Treatment  Patient Details  Name: [name] MRN: [id number] Date of Birth: Mar 11, 1966 Referring Provider (PT): Cornell Barman   Encounter Date: 08/08/2018  PT End of Session - 08/08/18 1209    Visit Number  2    Number of Visits  25    Date for PT Re-Evaluation  10/29/18    PT Start Time  1100    PT Stop Time  1145    PT Time Calculation (min)  45 min    Equipment Utilized During Treatment  Gait belt    Activity Tolerance  Patient tolerated treatment well;Patient limited by fatigue       Past Medical History:  Diagnosis Date  . Allergy   . Anemia   . Anxiety   . Arthritis   . Asthma   . Colon polyp   . Complication of anesthesia   . Diabetes mellitus   . Edema   . Fatigue   . Hypertension   . IBS (irritable bowel syndrome)   . Lump in female breast   . Methicillin resistant Staphylococcus aureus in conditions classified elsewhere and of unspecified site   . Migraines   . Morbid obesity ([place])   . Night sweats   . Nonspecific abnormal results of thyroid function study   . Other B-complex deficiencies   . Paresthesias   . PONV (postoperative nausea and vomiting)   . Pure hypercholesterolemia   . Reflux   . Sacral fracture ([place])   . Sinus problem   . Symptomatic states associated with artificial menopause   . Thyroid disease   . Type II or unspecified type diabetes mellitus without mention of complication, not stated as uncontrolled   . Unspecified sleep apnea     Past Surgical History:  Procedure Laterality Date  . abcess removal  1997   MRSA  . ABDOMINAL HYSTERECTOMY  1997   complete in 1997, partial was in 1995  . carpal tunnel release r  07/03/13   Dalldorf  . [place]  . CHOLECYSTECTOMY  03/1989  . COLONOSCOPY  03/21/2011   Normal.  [facility].  [name] KNEE  SURGERY  2006  . LAPAROSCOPIC GASTRIC BANDING  05/2009  . TONSILLECTOMY AND ADENOIDECTOMY  1974  . TUBAL LIGATION    . ULNAR TUNNEL RELEASE Right 02/05/2018   Procedure: CUBITAL TUNNEL RELEASE/DECOMPRESSION;  Surgeon: Melrose Nakayama, MD;  Location: [place];  Service: Orthopedics;  Laterality: Right;    There were no vitals filed for this visit.  Subjective Assessment - 08/08/18 1058    Subjective  Patient has been working on her HEP to get her LE's stronger. She had a fall 1 week ago, 08/02/18.She had a MD apt at [place] , and the machine is broken so the appointment was rescheduled.     Patient is accompained by:  Family member    Pertinent History   Patient fx her sacrum 9/18 and  she lost sensation to right foot, and she lost ability to DF her right foot. Patient had back surgery 03/2017 , she was sent home. She was walking with no device indoors and she could get out of the house and do the steps. June 18, 2017  her LLE was getting weaker and the right leg was getting stronger. Patient had follow up with neurosurgery. She was scheduled for  a steriod shot Jul 23, 2017, but on May 3 her legs didnt  feel right, she fell 4 times, she fractured her R fibula  on may 3rd, she went to hospital and spent one week. She went to SNF for 1 month. She had another neuro consult, resulting in kDx of  polyneuropathy. Then she had an apt with and [facility] said it was not polyneuropathy, (in [place])  She has a second consultation scheduled Dec 30 for a  Neuromuscular.consult.  She had a scan of the spine T 12-28-17  who then sent her to a thoracic surgeon at the end of November and was recommended to not due surgery. She does not know what her Dx is and she has been having home PT beginning August 25, 2017 . She has had HHPT 2 x  week for the last 5 months. She is able to walk 50 feet with RW. She is able to transfer from wc to stand independently.     How long can you stand comfortably?  5 mins    Patient Stated Goals  to walk  without the RW, or walk better, use the bathroom    Currently in Pain?  Yes    Pain Score  1     Pain Location  Back    Pain Orientation  Right    Pain Descriptors / Indicators  Aching    Pain Onset  1 to 4 weeks ago       Treatment:     STRENGTH:  Graded on a 0-5 scale Muscle Group Left Right                          Hip Flex -3/5 3+/5  Hip Abd 3/5 4/5  Hip Add 1/5 1/5  Hip Ext -4/5 1/5  Hip IR/ER    Knee Flex 3+/5 2/5  Knee Ext -4/5 4/5  Ankle DF 0/5 0/5  Ankle PF 3/5 0/5   Therapeutic exercise: Heel slides left x 10 reps, right x 10 x 2 sets sidelying hip ext, knee flex, x 10 x 2  Hip flex x 10 x 2 LLE Knee extension x 10 x 2  Nu-step x 10 mins level 3 , seat 11, no UE assist Good tolerance and motivation , no pain.                         PT Education - 08/08/18 1209    Education provided  Yes    Education Details  HEP, improved strength BLE    Person(s) Educated  Patient    Methods  Explanation    Comprehension  Verbalized understanding       PT Short Term Goals - 03/07/18 1345      PT SHORT TERM GOAL #1   Title  Patient will be independent in home exercise program to improve strength/mobility for better functional independence with ADLs.    Time  6    Period  Weeks    Status  New    Target Date  04/18/18        PT Long Term Goals - 08/06/18 1124      PT LONG TERM GOAL #1   Title  Patient will increase 10 meter walk test to >.50 m/s as to improve gait speed for better community ambulation and to reduce fall risk.    Time  8    Period  Weeks  Status  New    Target Date  10/29/18      PT LONG TERM GOAL #2   Title  Patient will transfer sit to stand  from 19 inch mat without assist or anyone supporting AD  to demonstrate improved LE strength to decrease falls risk.     Time  12    Period  Weeks    Status  New    Target Date  10/29/18      PT LONG TERM GOAL #3   Title  Patient will  demonstrate stance without AD  x 1 min to demonstrate decreased falls risk.     Time  12    Period  Weeks    Status  New    Target Date  10/29/18      PT LONG TERM GOAL #4   Title  Patient will  ambulate 500 feet to work towards community ambulation distances.     Time  12    Period  Weeks    Status  New    Target Date  10/29/18                                            STRENGTH:  Graded on a 0-5 scale Muscle Group Left Right                          Hip Flex -3/5 3+/5  Hip Abd 3/5 4/5  Hip Add 1/5 1/5  Hip Ext -4/5 1/5  Hip IR/ER    Knee Flex 3+/5 2/5  Knee Ext -4/5 4/5  Ankle DF 0/5 0/5  Ankle PF 3/5 0/5   Therapeutic exercise: Heel slides left x 10 reps, right x 10 x 2 sets sidelying hip ext, knee flex, x 10 x 2  Hip flex x 10 x 2 LLE Knee extension x 10 x 2  Nu-step x 10 mins level 3 , seat 11, no UE assist Good tolerance and motivation , no pain.        Plan - 08/08/18 1145    Clinical Impression Statement  Patient performed MMT  to compare with initial exam several months ago with improvements in knee flex, hip ext, hip abd. Her mobility is improving with ease during scooting and rolling. She performs LE exercises in supine, sidelying and sitting with manual resistance.     Rehab Potential  Good    PT Frequency  2x / week    PT Duration  12 weeks    PT Treatment/Interventions  Gait training;Therapeutic activities;Therapeutic exercise;Balance training;Neuromuscular re-education;Patient/family education;Orthotic Fit/Training;Manual techniques    PT Next Visit Plan  therapeutic exercise    Consulted and Agree with Plan of Care  Patient       Patient will benefit from skilled therapeutic intervention in order to improve the following deficits and impairments:  Decreased balance, Decreased endurance, Decreased mobility, Difficulty walking, Impaired sensation, Decreased range of motion, Decreased activity  tolerance, Decreased coordination, Decreased strength, Pain, Postural dysfunction  Visit Diagnosis: Muscle weakness (generalized)  Difficulty in walking, not elsewhere classified  Weakness of right leg  Pain in right ankle and joints of right foot     Problem List Patient Active Problem List   Diagnosis Date Noted  . Migraine without status migrainosus, not intractable   . Chronic pain syndrome   . Lumbar disc  disease with radiculopathy 07/21/2017  . Lumbar foraminal stenosis 07/21/2017  . Unable to walk 07/21/2017  . Multiple falls 07/21/2017  . Displaced fracture of proximal end of right fibula 07/21/2017  . Lower extremity weakness 05/28/2017  . Right lower quadrant abdominal pain 08/22/2016  . Abnormal TSH 01/10/2016  . Morbid obesity ([place]) 01/12/2014  . Routine general medical examination at a health care facility 10/28/2012  . GERD (gastroesophageal reflux disease) 10/28/2012  . Anxiety 12/20/2011  . Allergic rhinitis 12/20/2011  . Asthma 12/20/2011  . Lapband APL with HH repair 03/31/2011  . Uncontrolled type 2 diabetes mellitus with hyperglycemia, with long-term current use of insulin ([place]) 06/30/2007  . Hyperlipidemia 06/30/2007  . Essential hypertension 06/27/2007    Alanson Puls, [place] DPT 08/08/2018, 12:16 PM  [place] MAIN [facility] SERVICES [address] [place], [place], [address] Phone: [phone]   Fax:  [phone]  Name: [name] MRN: [id number] Date of Birth: [birth year]

## 2018-08-13 ENCOUNTER — Ambulatory Visit: Payer: BC Managed Care – PPO | Admitting: Physical Therapy

## 2018-08-13 ENCOUNTER — Other Ambulatory Visit: Payer: Self-pay

## 2018-08-13 DIAGNOSIS — M6281 Muscle weakness (generalized): Secondary | ICD-10-CM

## 2018-08-13 DIAGNOSIS — R29898 Other symptoms and signs involving the musculoskeletal system: Secondary | ICD-10-CM

## 2018-08-13 DIAGNOSIS — R262 Difficulty in walking, not elsewhere classified: Secondary | ICD-10-CM

## 2018-08-13 DIAGNOSIS — M25571 Pain in right ankle and joints of right foot: Secondary | ICD-10-CM

## 2018-08-13 NOTE — Therapy (Signed)
[place] MAIN [facility] SERVICES [address] [place], [place], [address] Phone: [phone]   Fax:  [phone]  Physical Therapy Treatment  Patient Details  Name: [name] MRN: [id number] Date of Birth: 03/22/1965 Referring Provider (PT): Cornell Barman   Encounter Date: 08/13/2018  PT End of Session - 08/13/18 1310    Visit Number  3    Number of Visits  25    Date for PT Re-Evaluation  10/29/18    PT Start Time  0103    PT Stop Time  0145    PT Time Calculation (min)  42 min    Equipment Utilized During Treatment  Gait belt    Activity Tolerance  Patient tolerated treatment well;Patient limited by fatigue       Past Medical History:  Diagnosis Date  . Allergy   . Anemia   . Anxiety   . Arthritis   . Asthma   . Colon polyp   . Complication of anesthesia   . Diabetes mellitus   . Edema   . Fatigue   . Hypertension   . IBS (irritable bowel syndrome)   . Lump in female breast   . Methicillin resistant Staphylococcus aureus in conditions classified elsewhere and of unspecified site   . Migraines   . Morbid obesity ([place])   . Night sweats   . Nonspecific abnormal results of thyroid function study   . Other B-complex deficiencies   . Paresthesias   . PONV (postoperative nausea and vomiting)   . Pure hypercholesterolemia   . Reflux   . Sacral fracture ([place])   . Sinus problem   . Symptomatic states associated with artificial menopause   . Thyroid disease   . Type II or unspecified type diabetes mellitus without mention of complication, not stated as uncontrolled   . Unspecified sleep apnea     Past Surgical History:  Procedure Laterality Date  . abcess removal  1997   MRSA  . ABDOMINAL HYSTERECTOMY  1997   complete in 1997, partial was in 1995  . carpal tunnel release r  07/03/13   Dalldorf  . [place]  . CHOLECYSTECTOMY  03/1989  . COLONOSCOPY  03/21/2011   Normal.  [facility].  [name] KNEE  SURGERY  2006  . LAPAROSCOPIC GASTRIC BANDING  05/2009  . TONSILLECTOMY AND ADENOIDECTOMY  1974  . TUBAL LIGATION    . ULNAR TUNNEL RELEASE Right 02/05/2018   Procedure: CUBITAL TUNNEL RELEASE/DECOMPRESSION;  Surgeon: Melrose Nakayama, MD;  Location: [place];  Service: Orthopedics;  Laterality: Right;    There were no vitals filed for this visit.  Subjective Assessment - 08/13/18 1309    Subjective  Patient has been working on her HEP to get her LE's stronger. She had a fall 1 week ago, 08/02/18.She had a MD apt at [place] , and the machine is broken so the appointment was rescheduled.     Patient is accompained by:  Family member    Pertinent History   Patient fx her sacrum 9/18 and  she lost sensation to right foot, and she lost ability to DF her right foot. Patient had back surgery 03/2017 , she was sent home. She was walking with no device indoors and she could get out of the house and do the steps. June 18, 2017  her LLE was getting weaker and the right leg was getting stronger. Patient had follow up with neurosurgery. She was scheduled for  a steriod shot Jul 23, 2017, but on May 3 her legs didnt  feel right, she fell 4 times, she fractured her R fibula  on may 3rd, she went to hospital and spent one week. She went to SNF for 1 month. She had another neuro consult, resulting in kDx of  polyneuropathy. Then she had an apt with and [facility] said it was not polyneuropathy, (in [place])  She has a second consultation scheduled Dec 30 for a  Neuromuscular.consult.  She had a scan of the spine T 12-28-17  who then sent her to a thoracic surgeon at the end of November and was recommended to not due surgery. She does not know what her Dx is and she has been having home PT beginning August 25, 2017 . She has had HHPT 2 x  week for the last 5 months. She is able to walk 50 feet with RW. She is able to transfer from wc to stand independently.     How long can you stand comfortably?  5 mins    Patient Stated Goals  to walk  without the RW, or walk better, use the bathroom    Currently in Pain?  Yes    Pain Score  3     Pain Location  Calf    Pain Orientation  Right;Left    Pain Descriptors / Indicators  Aching    Pain Onset  1 to 4 weeks ago      Treatment: Standing hip abd x 15 BLE, in parallel bars Standing marching x 15 x 2 BLE in parallel bars side stepping on TM . 1 m/sec x 1 min left and 2 mins right  Transfers with CGA for holding wC for support and chair for support  Step up to 2 inch step with min assist , step down 2 inch support x 2 trials with min assist x 2  No reports of pain but patient is fatigued.                          PT Education - 08/13/18 1309    Education provided  Yes    Education Details  HEP    Person(s) Educated  Patient    Methods  Explanation    Comprehension  Verbalized understanding       PT Short Term Goals - 03/07/18 1345      PT SHORT TERM GOAL #1   Title  Patient will be independent in home exercise program to improve strength/mobility for better functional independence with ADLs.    Time  6    Period  Weeks    Status  New    Target Date  04/18/18        PT Long Term Goals - 08/06/18 1124      PT LONG TERM GOAL #1   Title  Patient will increase 10 meter walk test to >.50 m/s as to improve gait speed for better community ambulation and to reduce fall risk.    Time  8    Period  Weeks    Status  New    Target Date  10/29/18      PT LONG TERM GOAL #2   Title  Patient will transfer sit to stand  from 19 inch mat without assist or anyone supporting AD  to demonstrate improved LE strength to decrease falls risk.     Time  12    Period  Weeks  Status  New    Target Date  10/29/18      PT LONG TERM GOAL #3   Title  Patient will demonstrate stance without AD  x 1 min to demonstrate decreased falls risk.     Time  12    Period  Weeks    Status  New    Target Date  10/29/18      PT LONG TERM GOAL #4   Title  Patient will   ambulate 500 feet to work towards community ambulation distances.     Time  12    Period  Weeks    Status  New    Target Date  10/29/18            Plan - 08/13/18 1312    Clinical Impression Statement  Patient is sore today and is able to perform therapeutic exercises in closed and open chain positions. She is walking wit    Rehab Potential  Good    PT Frequency  2x / week    PT Duration  12 weeks    PT Treatment/Interventions  Gait training;Therapeutic activities;Therapeutic exercise;Balance training;Neuromuscular re-education;Patient/family education;Orthotic Fit/Training;Manual techniques    PT Next Visit Plan  therapeutic exercise    Consulted and Agree with Plan of Care  Patient       Patient will benefit from skilled therapeutic intervention in order to improve the following deficits and impairments:  Decreased balance, Decreased endurance, Decreased mobility, Difficulty walking, Impaired sensation, Decreased range of motion, Decreased activity tolerance, Decreased coordination, Decreased strength, Pain, Postural dysfunction  Visit Diagnosis: Muscle weakness (generalized)  Difficulty in walking, not elsewhere classified  Weakness of right leg  Pain in right ankle and joints of right foot     Problem List Patient Active Problem List   Diagnosis Date Noted  . Migraine without status migrainosus, not intractable   . Chronic pain syndrome   . Lumbar disc disease with radiculopathy 07/21/2017  . Lumbar foraminal stenosis 07/21/2017  . Unable to walk 07/21/2017  . Multiple falls 07/21/2017  . Displaced fracture of proximal end of right fibula 07/21/2017  . Lower extremity weakness 05/28/2017  . Right lower quadrant abdominal pain 08/22/2016  . Abnormal TSH 01/10/2016  . Morbid obesity ([place]) 01/12/2014  . Routine general medical examination at a health care facility 10/28/2012  . GERD (gastroesophageal reflux disease) 10/28/2012  . Anxiety 12/20/2011  . Allergic  rhinitis 12/20/2011  . Asthma 12/20/2011  . Lapband APL with HH repair 03/31/2011  . Uncontrolled type 2 diabetes mellitus with hyperglycemia, with long-term current use of insulin ([place]) 06/30/2007  . Hyperlipidemia 06/30/2007  . Essential hypertension 06/27/2007    Alanson Puls, PT DPT 08/13/2018, 1:19 PM  [place] MAIN [facility] SERVICES [address] [place], [place], [address] Phone: [phone]   Fax:  [phone]  Name: [name] MRN: [id number] Date of Birth: Aug 02, 1965

## 2018-08-15 ENCOUNTER — Other Ambulatory Visit: Payer: Self-pay

## 2018-08-15 ENCOUNTER — Ambulatory Visit: Payer: BC Managed Care – PPO | Admitting: Physical Therapy

## 2018-08-15 DIAGNOSIS — R29898 Other symptoms and signs involving the musculoskeletal system: Secondary | ICD-10-CM

## 2018-08-15 DIAGNOSIS — R262 Difficulty in walking, not elsewhere classified: Secondary | ICD-10-CM

## 2018-08-15 DIAGNOSIS — M6281 Muscle weakness (generalized): Secondary | ICD-10-CM | POA: Diagnosis not present

## 2018-08-15 DIAGNOSIS — M25571 Pain in right ankle and joints of right foot: Secondary | ICD-10-CM

## 2018-08-15 NOTE — Therapy (Signed)
[place] MAIN [facility] SERVICES [address] [place], [place], [address] Phone: [phone]   Fax:  [phone]  Physical Therapy Treatment  Patient Details  Name: DEICY RUSK MRN: [id number] Date of Birth: [birth year] Referring Provider (PT): Cornell Barman   Encounter Date: 08/15/2018  PT End of Session - 08/15/18 1317    Visit Number  4    Number of Visits  25    Date for PT Re-Evaluation  10/29/18    PT Start Time  0106    PT Stop Time  0150    PT Time Calculation (min)  44 min    Equipment Utilized During Treatment  Gait belt    Activity Tolerance  Patient tolerated treatment well;Patient limited by fatigue       Past Medical History:  Diagnosis Date  . Allergy   . Anemia   . Anxiety   . Arthritis   . Asthma   . Colon polyp   . Complication of anesthesia   . Diabetes mellitus   . Edema   . Fatigue   . Hypertension   . IBS (irritable bowel syndrome)   . Lump in female breast   . Methicillin resistant Staphylococcus aureus in conditions classified elsewhere and of unspecified site   . Migraines   . Morbid obesity ([place])   . Night sweats   . Nonspecific abnormal results of thyroid function study   . Other B-complex deficiencies   . Paresthesias   . PONV (postoperative nausea and vomiting)   . Pure hypercholesterolemia   . Reflux   . Sacral fracture ([place])   . Sinus problem   . Symptomatic states associated with artificial menopause   . Thyroid disease   . Type II or unspecified type diabetes mellitus without mention of complication, not stated as uncontrolled   . Unspecified sleep apnea     Past Surgical History:  Procedure Laterality Date  . abcess removal  1997   MRSA  . ABDOMINAL HYSTERECTOMY  1997   complete in 1997, partial was in 1995  . carpal tunnel release r  07/03/13   Dalldorf  . [place]  . CHOLECYSTECTOMY  03/1989  . COLONOSCOPY  03/21/2011   Normal.  [facility].  [name] KNEE  SURGERY  2006  . LAPAROSCOPIC GASTRIC BANDING  05/2009  . TONSILLECTOMY AND ADENOIDECTOMY  1974  . TUBAL LIGATION    . ULNAR TUNNEL RELEASE Right 02/05/2018   Procedure: CUBITAL TUNNEL RELEASE/DECOMPRESSION;  Surgeon: Melrose Nakayama, MD;  Location: [place];  Service: Orthopedics;  Laterality: Right;    There were no vitals filed for this visit.  Subjective Assessment - 08/15/18 1315    Subjective  Patient has been working on her HEP to get her LE's stronger. She is waiting for a second appointment to see if she might have ALS    Patient is accompained by:  Family member    Pertinent History   Patient fx her sacrum 9/18 and  she lost sensation to right foot, and she lost ability to DF her right foot. Patient had back surgery 03/2017 , she was sent home. She was walking with no device indoors and she could get out of the house and do the steps. June 18, 2017  her LLE was getting weaker and the right leg was getting stronger. Patient had follow up with neurosurgery. She was scheduled for a steriod shot Jul 23, 2017, but on May 3 her legs  didnt  feel right, she fell 4 times, she fractured her R fibula  on may 3rd, she went to hospital and spent one week. She went to SNF for 1 month. She had another neuro consult, resulting in kDx of  polyneuropathy. Then she had an apt with and [facility] said it was not polyneuropathy, (in [place])  She has a second consultation scheduled Dec 30 for a  Neuromuscular.consult.  She had a scan of the spine T 12-28-17  who then sent her to a thoracic surgeon at the end of November and was recommended to not due surgery. She does not know what her Dx is and she has been having home PT beginning August 25, 2017 . She has had HHPT 2 x  week for the last 5 months. She is able to walk 50 feet with RW. She is able to transfer from wc to stand independently.     How long can you stand comfortably?  5 mins    Patient Stated Goals  to walk without the RW, or walk better, use the bathroom     Currently in Pain?  Yes    Pain Score  2     Pain Location  Knee    Pain Orientation  Left    Pain Descriptors / Indicators  Aching    Pain Onset  1 to 4 weeks ago             Therapeutic exercise: Bridges x 10  SLR X 15 x 2 sets BLE , assist on LLE Heel slides left x 10 reps, right x 10 x 2 sets sidelying hip ext, knee flex, x 10 x 2  Hip flex x 10 x 2 LLE Knee extension x 10 x 2  Intermittent squat with 3 interval holds x 5 reps Nu-step x 10 mins level 3 , seat 11, no UE assist  Therapeutic exercise: Transfer training with assist for holding the wc, wc <> nu-step, wc <> mat  Patient ambulates 30 feet x 2 with RW and BLE short leg braces Good tolerance and motivation , no pain.                     PT Education - 08/15/18 1317    Education provided  Yes    Education Details  HEP    Person(s) Educated  Patient    Methods  Explanation    Comprehension  Verbalized understanding       PT Short Term Goals - 03/07/18 1345      PT SHORT TERM GOAL #1   Title  Patient will be independent in home exercise program to improve strength/mobility for better functional independence with ADLs.    Time  6    Period  Weeks    Status  New    Target Date  04/18/18        PT Long Term Goals - 08/06/18 1124      PT LONG TERM GOAL #1   Title  Patient will increase 10 meter walk test to >.50 m/s as to improve gait speed for better community ambulation and to reduce fall risk.    Time  8    Period  Weeks    Status  New    Target Date  10/29/18      PT LONG TERM GOAL #2   Title  Patient will transfer sit to stand  from 19 inch mat without assist or anyone supporting AD  to demonstrate improved  LE strength to decrease falls risk.     Time  12    Period  Weeks    Status  New    Target Date  10/29/18      PT LONG TERM GOAL #3   Title  Patient will demonstrate stance without AD  x 1 min to demonstrate decreased falls risk.     Time  12    Period  Weeks     Status  New    Target Date  10/29/18      PT LONG TERM GOAL #4   Title  Patient will  ambulate 500 feet to work towards community ambulation distances.     Time  12    Period  Weeks    Status  New    Target Date  10/29/18            Plan - 08/15/18 1318    Clinical Impression Statement  Patient performs BLE exercises and bed mobiity traininig     Rehab Potential  Good    PT Frequency  2x / week    PT Duration  12 weeks    PT Treatment/Interventions  Gait training;Therapeutic activities;Therapeutic exercise;Balance training;Neuromuscular re-education;Patient/family education;Orthotic Fit/Training;Manual techniques    PT Next Visit Plan  therapeutic exercise    Consulted and Agree with Plan of Care  Patient       Patient will benefit from skilled therapeutic intervention in order to improve the following deficits and impairments:  Decreased balance, Decreased endurance, Decreased mobility, Difficulty walking, Impaired sensation, Decreased range of motion, Decreased activity tolerance, Decreased coordination, Decreased strength, Pain, Postural dysfunction  Visit Diagnosis: Muscle weakness (generalized)  Difficulty in walking, not elsewhere classified  Weakness of right leg  Pain in right ankle and joints of right foot     Problem List Patient Active Problem List   Diagnosis Date Noted  . Migraine without status migrainosus, not intractable   . Chronic pain syndrome   . Lumbar disc disease with radiculopathy 07/21/2017  . Lumbar foraminal stenosis 07/21/2017  . Unable to walk 07/21/2017  . Multiple falls 07/21/2017  . Displaced fracture of proximal end of right fibula 07/21/2017  . Lower extremity weakness 05/28/2017  . Right lower quadrant abdominal pain 08/22/2016  . Abnormal TSH 01/10/2016  . Morbid obesity ([place]) 01/12/2014  . Routine general medical examination at a health care facility 10/28/2012  . GERD (gastroesophageal reflux disease) 10/28/2012  .  Anxiety 12/20/2011  . Allergic rhinitis 12/20/2011  . Asthma 12/20/2011  . Lapband APL with HH repair 03/31/2011  . Uncontrolled type 2 diabetes mellitus with hyperglycemia, with long-term current use of insulin ([place]) 06/30/2007  . Hyperlipidemia 06/30/2007  . Essential hypertension 06/27/2007    Alanson Puls, [place] DPT 08/15/2018, 1:34 PM  [place] MAIN [facility] SERVICES [address] [place], [place], [address] Phone: [phone]   Fax:  [phone]  Name: JEYDA SIEBEL MRN: [id number] Date of Birth: May 29, 1965

## 2018-08-19 MED ORDER — PROPOFOL 10 MG/ML IV BOLUS
INTRAVENOUS | Status: AC
  Filled 2018-08-19: qty 40

## 2018-08-20 ENCOUNTER — Ambulatory Visit: Payer: BC Managed Care – PPO | Attending: Primary Care | Admitting: Physical Therapy

## 2018-08-20 ENCOUNTER — Other Ambulatory Visit: Payer: Self-pay

## 2018-08-20 DIAGNOSIS — M25571 Pain in right ankle and joints of right foot: Secondary | ICD-10-CM | POA: Diagnosis present

## 2018-08-20 DIAGNOSIS — M6281 Muscle weakness (generalized): Secondary | ICD-10-CM | POA: Diagnosis present

## 2018-08-20 DIAGNOSIS — R29898 Other symptoms and signs involving the musculoskeletal system: Secondary | ICD-10-CM | POA: Insufficient documentation

## 2018-08-20 DIAGNOSIS — M25551 Pain in right hip: Secondary | ICD-10-CM | POA: Insufficient documentation

## 2018-08-20 DIAGNOSIS — R262 Difficulty in walking, not elsewhere classified: Secondary | ICD-10-CM | POA: Insufficient documentation

## 2018-08-20 NOTE — Therapy (Signed)
[place] MAIN [facility] SERVICES [address] [place], [place], [address] Phone: [phone]   Fax:  [phone]  Physical Therapy Treatment  Patient Details  Name: [name] MRN: [id number] Date of Birth: 01-30-1966 Referring Provider (PT): Cornell Barman   Encounter Date: 08/20/2018  PT End of Session - 08/20/18 1123    Visit Number  5  (Pended)     Number of Visits  25  (Pended)     Date for PT Re-Evaluation  10/29/18  (Pended)     PT Start Time  1100  (Pended)     PT Stop Time  1145  (Pended)     PT Time Calculation (min)  45 min  (Pended)     Equipment Utilized During Treatment  Gait belt  (Pended)     Activity Tolerance  Patient tolerated treatment well;Patient limited by fatigue  (Pended)        Past Medical History:  Diagnosis Date  . Allergy   . Anemia   . Anxiety   . Arthritis   . Asthma   . Colon polyp   . Complication of anesthesia   . Diabetes mellitus   . Edema   . Fatigue   . Hypertension   . IBS (irritable bowel syndrome)   . Lump in female breast   . Methicillin resistant Staphylococcus aureus in conditions classified elsewhere and of unspecified site   . Migraines   . Morbid obesity ([place])   . Night sweats   . Nonspecific abnormal results of thyroid function study   . Other B-complex deficiencies   . Paresthesias   . PONV (postoperative nausea and vomiting)   . Pure hypercholesterolemia   . Reflux   . Sacral fracture ([place])   . Sinus problem   . Symptomatic states associated with artificial menopause   . Thyroid disease   . Type II or unspecified type diabetes mellitus without mention of complication, not stated as uncontrolled   . Unspecified sleep apnea     Past Surgical History:  Procedure Laterality Date  . abcess removal  1997   MRSA  . ABDOMINAL HYSTERECTOMY  1997   complete in 1997, partial was in 1995  . carpal tunnel release r  07/03/13   [facility]alldorf  . [place]  .  CHOLECYSTECTOMY  03/1989  . COLONOSCOPY  03/21/2011   Normal.  [facility].  [name] KNEE SURGERY  2006  . LAPAROSCOPIC GASTRIC BANDING  05/2009  . TONSILLECTOMY AND ADENOIDECTOMY  1974  . TUBAL LIGATION    . ULNAR TUNNEL RELEASE Right 02/05/2018   Procedure: CUBITAL TUNNEL RELEASE/DECOMPRESSION;  Surgeon: Melrose Nakayama, MD;  Location: [place];  Service: Orthopedics;  Laterality: Right;    There were no vitals filed for this visit.  Subjective Assessment - 08/20/18 1123    Subjective  Patient has been working on her HEP to get her LE's stronger. She is waiting for a second appointment to see if she might have ALS    Patient is accompained by:  Family member    Pertinent History   Patient fx her sacrum 9/18 and  she lost sensation to right foot, and she lost ability to DF her right foot. Patient had back surgery 03/2017 , she was sent home. She was walking with no device indoors and she could get out of the house and do the steps. June 18, 2017  her LLE was getting weaker and the right leg was  getting stronger. Patient had follow up with neurosurgery. She was scheduled for a steriod shot Jul 23, 2017, but on May 3 her legs didnt  feel right, she fell 4 times, she fractured her R fibula  on may 3rd, she went to hospital and spent one week. She went to SNF for 1 month. She had another neuro consult, resulting in kDx of  polyneuropathy. Then she had an apt with and [facility] said it was not polyneuropathy, (in [place])  She has a second consultation scheduled Dec 30 for a  Neuromuscular.consult.  She had a scan of the spine T 12-28-17  who then sent her to a thoracic surgeon at the end of November and was recommended to not due surgery. She does not know what her Dx is and she has been having home PT beginning August 25, 2017 . She has had HHPT 2 x  week for the last 5 months. She is able to walk 50 feet with RW. She is able to transfer from wc to stand independently.     How long can you stand comfortably?  5 mins     Patient Stated Goals  to walk without the RW, or walk better, use the bathroom    Currently in Pain?  No/denies    Pain Onset  1 to 4 weeks ago        Treatment: Nu-step x 10 mins LE only L 3  Side stepping in parallel bars 10  Feet x 5 Marching in place x 10 x 2  Hip abd x 10 x 2 Hip extension x 10 x 2  Sit to stand from arm chair x 10   Patient needs VC for correct posture and technique   Pt educated throughout session about proper posture and technique with exercises. Improved exercise technique, movement at target joints, use of target muscles after min to mod verbal, visual, tactile cues.                    PT Education - 08/20/18 1123    Education provided  Yes    Education Details  HEP    Person(s) Educated  Patient    Methods  Explanation    Comprehension  Verbalized understanding       PT Short Term Goals - 03/07/18 1345      PT SHORT TERM GOAL #1   Title  Patient will be independent in home exercise program to improve strength/mobility for better functional independence with ADLs.    Time  6    Period  Weeks    Status  New    Target Date  04/18/18        PT Long Term Goals - 08/06/18 1124      PT LONG TERM GOAL #1   Title  Patient will increase 10 meter walk test to >.50 m/s as to improve gait speed for better community ambulation and to reduce fall risk.    Time  8    Period  Weeks    Status  New    Target Date  10/29/18      PT LONG TERM GOAL #2   Title  Patient will transfer sit to stand  from 19 inch mat without assist or anyone supporting AD  to demonstrate improved LE strength to decrease falls risk.     Time  12    Period  Weeks    Status  New    Target Date  10/29/18  PT LONG TERM GOAL #3   Title  Patient will demonstrate stance without AD  x 1 min to demonstrate decreased falls risk.     Time  12    Period  Weeks    Status  New    Target Date  10/29/18      PT LONG TERM GOAL #4   Title  Patient will  ambulate 500  feet to work towards community ambulation distances.     Time  12    Period  Weeks    Status  New    Target Date  10/29/18            Plan - 08/20/18 1256    Clinical Impression Statement  Patient performs standing exercises in parallel bars for strengthenig LE's and trunk. She performs open and closed chain exercises with VC for saftey and techniques. Patient will continue to benefit from skilled PT  To improve strength and mobility.    Rehab Potential  Good    PT Frequency  2x / week    PT Duration  12 weeks    PT Treatment/Interventions  Gait training;Therapeutic activities;Therapeutic exercise;Balance training;Neuromuscular re-education;Patient/family education;Orthotic Fit/Training;Manual techniques    PT Next Visit Plan  therapeutic exercise    Consulted and Agree with Plan of Care  Patient       Patient will benefit from skilled therapeutic intervention in order to improve the following deficits and impairments:  Decreased balance, Decreased endurance, Decreased mobility, Difficulty walking, Impaired sensation, Decreased range of motion, Decreased activity tolerance, Decreased coordination, Decreased strength, Pain, Postural dysfunction  Visit Diagnosis: No diagnosis found.     Problem List Patient Active Problem List   Diagnosis Date Noted  . Migraine without status migrainosus, not intractable   . Chronic pain syndrome   . Lumbar disc disease with radiculopathy 07/21/2017  . Lumbar foraminal stenosis 07/21/2017  . Unable to walk 07/21/2017  . Multiple falls 07/21/2017  . Displaced fracture of proximal end of right fibula 07/21/2017  . Lower extremity weakness 05/28/2017  . Right lower quadrant abdominal pain 08/22/2016  . Abnormal TSH 01/10/2016  . Morbid obesity ([place]) 01/12/2014  . Routine general medical examination at a health care facility 10/28/2012  . GERD (gastroesophageal reflux disease) 10/28/2012  . Anxiety 12/20/2011  . Allergic rhinitis 12/20/2011   . Asthma 12/20/2011  . Lapband APL with HH repair 03/31/2011  . Uncontrolled type 2 diabetes mellitus with hyperglycemia, with long-term current use of insulin ([place]) 06/30/2007  . Hyperlipidemia 06/30/2007  . Essential hypertension 06/27/2007    Alanson Puls, PT DPT 08/20/2018, 1:10 PM  [place] MAIN [facility] SERVICES [address] [place], [place], [address] Phone: [phone]   Fax:  [phone]  Name: [name] MRN: [id number] Date of Birth: Mar 31, 1965

## 2018-08-22 ENCOUNTER — Other Ambulatory Visit: Payer: Self-pay

## 2018-08-22 ENCOUNTER — Encounter: Payer: Self-pay | Admitting: Physical Therapy

## 2018-08-22 ENCOUNTER — Ambulatory Visit: Payer: BC Managed Care – PPO | Admitting: Physical Therapy

## 2018-08-22 DIAGNOSIS — R29898 Other symptoms and signs involving the musculoskeletal system: Secondary | ICD-10-CM

## 2018-08-22 DIAGNOSIS — R262 Difficulty in walking, not elsewhere classified: Secondary | ICD-10-CM

## 2018-08-22 DIAGNOSIS — M6281 Muscle weakness (generalized): Secondary | ICD-10-CM | POA: Diagnosis not present

## 2018-08-22 DIAGNOSIS — M25551 Pain in right hip: Secondary | ICD-10-CM

## 2018-08-22 DIAGNOSIS — M25571 Pain in right ankle and joints of right foot: Secondary | ICD-10-CM

## 2018-08-22 NOTE — Therapy (Signed)
[place] MAIN [facility] SERVICES [address] [place], [place], [address] Phone: [phone]   Fax:  [phone]  Physical Therapy Treatment  Patient Details  Name: [name] MRN: [id number] Date of Birth: 1965/11/27 Referring Provider (PT): Cornell Barman   Encounter Date: 08/22/2018  PT End of Session - 08/22/18 1311    Visit Number  6    Number of Visits  25    Date for PT Re-Evaluation  10/29/18    PT Start Time  1115    PT Stop Time  1155    PT Time Calculation (min)  40 min    Equipment Utilized During Treatment  Gait belt    Activity Tolerance  Patient tolerated treatment well;Patient limited by fatigue       Past Medical History:  Diagnosis Date  . Allergy   . Anemia   . Anxiety   . Arthritis   . Asthma   . Colon polyp   . Complication of anesthesia   . Diabetes mellitus   . Edema   . Fatigue   . Hypertension   . IBS (irritable bowel syndrome)   . Lump in female breast   . Methicillin resistant Staphylococcus aureus in conditions classified elsewhere and of unspecified site   . Migraines   . Morbid obesity ([place])   . Night sweats   . Nonspecific abnormal results of thyroid function study   . Other B-complex deficiencies   . Paresthesias   . PONV (postoperative nausea and vomiting)   . Pure hypercholesterolemia   . Reflux   . Sacral fracture ([place])   . Sinus problem   . Symptomatic states associated with artificial menopause   . Thyroid disease   . Type II or unspecified type diabetes mellitus without mention of complication, not stated as uncontrolled   . Unspecified sleep apnea     Past Surgical History:  Procedure Laterality Date  . abcess removal  1997   MRSA  . ABDOMINAL HYSTERECTOMY  1997   complete in 1997, partial was in 1995  . carpal tunnel release r  07/03/13   Dalldorf  . [place]  . CHOLECYSTECTOMY  03/1989  . COLONOSCOPY  03/21/2011   Normal.  [facility].  [name] KNEE  SURGERY  2006  . LAPAROSCOPIC GASTRIC BANDING  05/2009  . TONSILLECTOMY AND ADENOIDECTOMY  1974  . TUBAL LIGATION    . ULNAR TUNNEL RELEASE Right 02/05/2018   Procedure: CUBITAL TUNNEL RELEASE/DECOMPRESSION;  Surgeon: Melrose Nakayama, MD;  Location: [place];  Service: Orthopedics;  Laterality: Right;    There were no vitals filed for this visit.  Subjective Assessment - 08/22/18 1309    Subjective  Patient has been working on her HEP to get her LE's stronger. She is waiting for a second appointment to see if she might have ALS    Patient is accompained by:  Family member    Pertinent History   Patient fx her sacrum 9/18 and  she lost sensation to right foot, and she lost ability to DF her right foot. Patient had back surgery 03/2017 , she was sent home. She was walking with no device indoors and she could get out of the house and do the steps. June 18, 2017  her LLE was getting weaker and the right leg was getting stronger. Patient had follow up with neurosurgery. She was scheduled for a steriod shot Jul 23, 2017, but on May 3 her legs  didnt  feel right, she fell 4 times, she fractured her R fibula  on may 3rd, she went to hospital and spent one week. She went to SNF for 1 month. She had another neuro consult, resulting in kDx of  polyneuropathy. Then she had an apt with and [facility] said it was not polyneuropathy, (in [place])  She has a second consultation scheduled Dec 30 for a  Neuromuscular.consult.  She had a scan of the spine T 12-28-17  who then sent her to a thoracic surgeon at the end of November and was recommended to not due surgery. She does not know what her Dx is and she has been having home PT beginning August 25, 2017 . She has had HHPT 2 x  week for the last 5 months. She is able to walk 50 feet with RW. She is able to transfer from wc to stand independently.     How long can you stand comfortably?  5 mins    Patient Stated Goals  to walk without the RW, or walk better, use the bathroom     Currently in Pain?  No/denies    Pain Score  0-No pain    Pain Onset  1 to 4 weeks ago       Treatment: Standing in parallel bars hip abd with YTB x 15 x 2 BLE Standing in parallel bars hip ext with YTB x 15 x 2 BLE Sit to stand x 10 to RW Nu-step x 10 mins L 3 with assist and cues to bring LLE hip into neutral position Transfers wc to stand, wc to nu-step , nu-step to stand  Backwards ambulation in parallel bars 10 feet x 2 , UE support   Pt educated throughout session about proper posture and technique with exercises. Improved exercise technique, movement at target joints, use of target muscles after min to mod verbal, visual, tactile cues.                      PT Education - 08/22/18 1309    Education provided  Yes    Education Details  HEP    Person(s) Educated  Patient    Methods  Explanation    Comprehension  Verbalized understanding;Returned demonstration;Need further instruction       PT Short Term Goals - 03/07/18 1345      PT SHORT TERM GOAL #1   Title  Patient will be independent in home exercise program to improve strength/mobility for better functional independence with ADLs.    Time  6    Period  Weeks    Status  New    Target Date  04/18/18        PT Long Term Goals - 08/06/18 1124      PT LONG TERM GOAL #1   Title  Patient will increase 10 meter walk test to >.50 m/s as to improve gait speed for better community ambulation and to reduce fall risk.    Time  8    Period  Weeks    Status  New    Target Date  10/29/18      PT LONG TERM GOAL #2   Title  Patient will transfer sit to stand  from 19 inch mat without assist or anyone supporting AD  to demonstrate improved LE strength to decrease falls risk.     Time  12    Period  Weeks    Status  New    Target Date  10/29/18      PT LONG TERM GOAL #3   Title  Patient will demonstrate stance without AD  x 1 min to demonstrate decreased falls risk.     Time  12    Period  Weeks     Status  New    Target Date  10/29/18      PT LONG TERM GOAL #4   Title  Patient will  ambulate 500 feet to work towards community ambulation distances.     Time  12    Period  Weeks    Status  New    Target Date  10/29/18            Plan - 08/22/18 1312    Clinical Impression Statement  Patient performs mobility training and strengthening of BLE in parallel bars with YTB standing without pain behaviors. She will continue to benefit from skilled PT to improve strength and mobility.     Rehab Potential  Good    PT Frequency  2x / week    PT Duration  12 weeks    PT Treatment/Interventions  Gait training;Therapeutic activities;Therapeutic exercise;Balance training;Neuromuscular re-education;Patient/family education;Orthotic Fit/Training;Manual techniques    PT Next Visit Plan  therapeutic exercise    Consulted and Agree with Plan of Care  Patient       Patient will benefit from skilled therapeutic intervention in order to improve the following deficits and impairments:  Decreased balance, Decreased endurance, Decreased mobility, Difficulty walking, Impaired sensation, Decreased range of motion, Decreased activity tolerance, Decreased coordination, Decreased strength, Pain, Postural dysfunction  Visit Diagnosis: Muscle weakness (generalized)  Difficulty in walking, not elsewhere classified  Weakness of right leg  Pain in right ankle and joints of right foot  Pain in right hip     Problem List Patient Active Problem List   Diagnosis Date Noted  . Migraine without status migrainosus, not intractable   . Chronic pain syndrome   . Lumbar disc disease with radiculopathy 07/21/2017  . Lumbar foraminal stenosis 07/21/2017  . Unable to walk 07/21/2017  . Multiple falls 07/21/2017  . Displaced fracture of proximal end of right fibula 07/21/2017  . Lower extremity weakness 05/28/2017  . Right lower quadrant abdominal pain 08/22/2016  . Abnormal TSH 01/10/2016  . Morbid  obesity ([place]) 01/12/2014  . Routine general medical examination at a health care facility 10/28/2012  . GERD (gastroesophageal reflux disease) 10/28/2012  . Anxiety 12/20/2011  . Allergic rhinitis 12/20/2011  . Asthma 12/20/2011  . Lapband APL with HH repair 03/31/2011  . Uncontrolled type 2 diabetes mellitus with hyperglycemia, with long-term current use of insulin ([place]) 06/30/2007  . Hyperlipidemia 06/30/2007  . Essential hypertension 06/27/2007    Alanson Puls, PT DPT 08/22/2018, 1:14 PM  [place] MAIN [facility] SERVICES [address] [place], [place], [address] Phone: [phone]   Fax:  [phone]  Name: [name] MRN: [id number] Date of Birth: April 13, 1965

## 2018-08-26 ENCOUNTER — Ambulatory Visit: Payer: BC Managed Care – PPO | Admitting: Physical Therapy

## 2018-08-26 ENCOUNTER — Other Ambulatory Visit: Payer: Self-pay

## 2018-08-26 DIAGNOSIS — R262 Difficulty in walking, not elsewhere classified: Secondary | ICD-10-CM

## 2018-08-26 DIAGNOSIS — R29898 Other symptoms and signs involving the musculoskeletal system: Secondary | ICD-10-CM

## 2018-08-26 DIAGNOSIS — M6281 Muscle weakness (generalized): Secondary | ICD-10-CM | POA: Diagnosis not present

## 2018-08-26 NOTE — Therapy (Signed)
[place] MAIN [facility] SERVICES [address] [place], [place], [address] Phone: [phone]   Fax:  [phone]  Physical Therapy Treatment  Patient Details  Name: [name] MRN: [id number] Date of Birth: December 09, 1965 Referring Provider (PT): Cornell Barman   Encounter Date: 08/26/2018  PT End of Session - 08/26/18 1257    Visit Number  7    Number of Visits  25    Date for PT Re-Evaluation  10/29/18    PT Start Time  0100    PT Stop Time  0140    PT Time Calculation (min)  40 min    Equipment Utilized During Treatment  Gait belt    Activity Tolerance  Patient tolerated treatment well;Patient limited by fatigue       Past Medical History:  Diagnosis Date  . Allergy   . Anemia   . Anxiety   . Arthritis   . Asthma   . Colon polyp   . Complication of anesthesia   . Diabetes mellitus   . Edema   . Fatigue   . Hypertension   . IBS (irritable bowel syndrome)   . Lump in female breast   . Methicillin resistant Staphylococcus aureus in conditions classified elsewhere and of unspecified site   . Migraines   . Morbid obesity ([place])   . Night sweats   . Nonspecific abnormal results of thyroid function study   . Other B-complex deficiencies   . Paresthesias   . PONV (postoperative nausea and vomiting)   . Pure hypercholesterolemia   . Reflux   . Sacral fracture ([place])   . Sinus problem   . Symptomatic states associated with artificial menopause   . Thyroid disease   . Type II or unspecified type diabetes mellitus without mention of complication, not stated as uncontrolled   . Unspecified sleep apnea     Past Surgical History:  Procedure Laterality Date  . abcess removal  1997   MRSA  . ABDOMINAL HYSTERECTOMY  1997   complete in 1997, partial was in 1995  . carpal tunnel release r  07/03/13   Dalldorf  . [place]  . CHOLECYSTECTOMY  03/1989  . COLONOSCOPY  03/21/2011   Normal.  [facility].  [name] KNEE  SURGERY  2006  . LAPAROSCOPIC GASTRIC BANDING  05/2009  . TONSILLECTOMY AND ADENOIDECTOMY  1974  . TUBAL LIGATION    . ULNAR TUNNEL RELEASE Right 02/05/2018   Procedure: CUBITAL TUNNEL RELEASE/DECOMPRESSION;  Surgeon: Melrose Nakayama, MD;  Location: [place];  Service: Orthopedics;  Laterality: Right;    There were no vitals filed for this visit.  Subjective Assessment - 08/26/18 1303    Subjective  Patient has been working on her HEP to get her LE's stronger. Her knees hurt 2/10 today.     Patient is accompained by:  Family member    Pertinent History   Patient fx her sacrum 9/18 and  she lost sensation to right foot, and she lost ability to DF her right foot. Patient had back surgery 03/2017 , she was sent home. She was walking with no device indoors and she could get out of the house and do the steps. June 18, 2017  her LLE was getting weaker and the right leg was getting stronger. Patient had follow up with neurosurgery. She was scheduled for a steriod shot Jul 23, 2017, but on May 3 her legs didnt  feel right, she fell 4 times,  she fractured her R fibula  on may 3rd, she went to hospital and spent one week. She went to SNF for 1 month. She had another neuro consult, resulting in kDx of  polyneuropathy. Then she had an apt with and [facility] said it was not polyneuropathy, (in [place])  She has a second consultation scheduled Dec 30 for a  Neuromuscular.consult.  She had a scan of the spine T 12-28-17  who then sent her to a thoracic surgeon at the end of November and was recommended to not due surgery. She does not know what her Dx is and she has been having home PT beginning August 25, 2017 . She has had HHPT 2 x  week for the last 5 months. She is able to walk 50 feet with RW. She is able to transfer from wc to stand independently.     How long can you stand comfortably?  5 mins    Patient Stated Goals  to walk without the RW, or walk better, use the bathroom    Currently in Pain?  Yes    Pain Score  2      Pain Location  Knee    Pain Orientation  Right;Left    Pain Descriptors / Indicators  Aching    Pain Radiating Towards  na    Pain Onset  1 to 4 weeks ago    Aggravating Factors   walking    Pain Relieving Factors  rest    Effect of Pain on Daily Activities  makes her move slower       Treatment: Nu-step x 5 mins level 3  Standing in parallel bars hip abd with YTB x 15 x 2 BLE Standing in parallel bars hip ext with YTB x 15 x 2 BLE Sit to stand x 10 to RW, with both hands on the arms of the chair and cues to fwd lean  Nu-step x 10 mins L 3 with assist and cues to bring LLE hip into neutral position Transfers wc to stand, wc to nu-step , nu-step to stand  Backwards ambulation in parallel bars 10 feet x 2 , UE support  Pt educated throughout session about proper posture and technique with exercises. Improved exercise technique, movement at target joints, use of target muscles after min to mod verbal, visual, tactile cues                        PT Education - 08/26/18 1257    Education provided  Yes    Education Details  HEP    Person(s) Educated  Patient    Methods  Explanation;Demonstration    Comprehension  Returned demonstration;Need further instruction       PT Short Term Goals - 03/07/18 1345      PT SHORT TERM GOAL #1   Title  Patient will be independent in home exercise program to improve strength/mobility for better functional independence with ADLs.    Time  6    Period  Weeks    Status  New    Target Date  04/18/18        PT Long Term Goals - 08/06/18 1124      PT LONG TERM GOAL #1   Title  Patient will increase 10 meter walk test to >.50 m/s as to improve gait speed for better community ambulation and to reduce fall risk.    Time  8    Period  Weeks  Status  New    Target Date  10/29/18      PT LONG TERM GOAL #2   Title  Patient will transfer sit to stand  from 19 inch mat without assist or anyone supporting AD  to demonstrate  improved LE strength to decrease falls risk.     Time  12    Period  Weeks    Status  New    Target Date  10/29/18      PT LONG TERM GOAL #3   Title  Patient will demonstrate stance without AD  x 1 min to demonstrate decreased falls risk.     Time  12    Period  Weeks    Status  New    Target Date  10/29/18      PT LONG TERM GOAL #4   Title  Patient will  ambulate 500 feet to work towards community ambulation distances.     Time  12    Period  Weeks    Status  New    Target Date  10/29/18            Plan - 08/26/18 1257    Clinical Impression Statement  Patient performs LE exercises and mobility training from sit to stand with RW. She is having some pain in B knees but is able to tolerate therapy. She needs cues for correct posture and technique  She will continue to benefit from skilled PT to improve strength and mobility.     Rehab Potential  Good    PT Frequency  2x / week    PT Duration  12 weeks    PT Treatment/Interventions  Gait training;Therapeutic activities;Therapeutic exercise;Balance training;Neuromuscular re-education;Patient/family education;Orthotic Fit/Training;Manual techniques    PT Next Visit Plan  therapeutic exercise    Consulted and Agree with Plan of Care  Patient       Patient will benefit from skilled therapeutic intervention in order to improve the following deficits and impairments:  Decreased balance, Decreased endurance, Decreased mobility, Difficulty walking, Impaired sensation, Decreased range of motion, Decreased activity tolerance, Decreased coordination, Decreased strength, Pain, Postural dysfunction  Visit Diagnosis: Muscle weakness (generalized)  Difficulty in walking, not elsewhere classified  Weakness of right leg     Problem List Patient Active Problem List   Diagnosis Date Noted  . Migraine without status migrainosus, not intractable   . Chronic pain syndrome   . Lumbar disc disease with radiculopathy 07/21/2017  . Lumbar  foraminal stenosis 07/21/2017  . Unable to walk 07/21/2017  . Multiple falls 07/21/2017  . Displaced fracture of proximal end of right fibula 07/21/2017  . Lower extremity weakness 05/28/2017  . Right lower quadrant abdominal pain 08/22/2016  . Abnormal TSH 01/10/2016  . Morbid obesity ([place]) 01/12/2014  . Routine general medical examination at a health care facility 10/28/2012  . GERD (gastroesophageal reflux disease) 10/28/2012  . Anxiety 12/20/2011  . Allergic rhinitis 12/20/2011  . Asthma 12/20/2011  . Lapband APL with HH repair 03/31/2011  . Uncontrolled type 2 diabetes mellitus with hyperglycemia, with long-term current use of insulin ([place]) 06/30/2007  . Hyperlipidemia 06/30/2007  . Essential hypertension 06/27/2007    Alanson Puls, PT DPT 08/26/2018, 1:55 PM  [place] MAIN [facility] SERVICES [address] [place], [place], [address] Phone: [phone]   Fax:  [phone]  Name: [name] MRN: [id number] Date of Birth: 1965-08-16

## 2018-08-27 ENCOUNTER — Encounter: Payer: Self-pay | Admitting: Physical Therapy

## 2018-08-29 ENCOUNTER — Ambulatory Visit: Payer: BC Managed Care – PPO | Admitting: Physical Therapy

## 2018-09-03 ENCOUNTER — Other Ambulatory Visit: Payer: Self-pay

## 2018-09-03 ENCOUNTER — Ambulatory Visit: Payer: BC Managed Care – PPO | Admitting: Physical Therapy

## 2018-09-03 ENCOUNTER — Encounter: Payer: Self-pay | Admitting: Physical Therapy

## 2018-09-03 DIAGNOSIS — M6281 Muscle weakness (generalized): Secondary | ICD-10-CM

## 2018-09-03 DIAGNOSIS — M25571 Pain in right ankle and joints of right foot: Secondary | ICD-10-CM

## 2018-09-03 DIAGNOSIS — R262 Difficulty in walking, not elsewhere classified: Secondary | ICD-10-CM

## 2018-09-03 DIAGNOSIS — R29898 Other symptoms and signs involving the musculoskeletal system: Secondary | ICD-10-CM

## 2018-09-03 DIAGNOSIS — M25551 Pain in right hip: Secondary | ICD-10-CM

## 2018-09-03 NOTE — Therapy (Signed)
[place] MAIN [facility] SERVICES [address] [place], [place], [address] Phone: [phone]   Fax:  [phone]  Physical Therapy Treatment  Patient Details  Name: [name] MRN: [id number] Date of Birth: 1965/09/16 Referring Provider (PT): Cornell Barman   Encounter Date: 09/03/2018  PT End of Session - 09/03/18 1111    Visit Number  8    Number of Visits  25    Date for PT Re-Evaluation  10/29/18    PT Start Time  1100    PT Stop Time  1145    PT Time Calculation (min)  45 min    Equipment Utilized During Treatment  Gait belt    Activity Tolerance  Patient tolerated treatment well;Patient limited by fatigue       Past Medical History:  Diagnosis Date  . Allergy   . Anemia   . Anxiety   . Arthritis   . Asthma   . Colon polyp   . Complication of anesthesia   . Diabetes mellitus   . Edema   . Fatigue   . Hypertension   . IBS (irritable bowel syndrome)   . Lump in female breast   . Methicillin resistant Staphylococcus aureus in conditions classified elsewhere and of unspecified site   . Migraines   . Morbid obesity ([place])   . Night sweats   . Nonspecific abnormal results of thyroid function study   . Other B-complex deficiencies   . Paresthesias   . PONV (postoperative nausea and vomiting)   . Pure hypercholesterolemia   . Reflux   . Sacral fracture ([place])   . Sinus problem   . Symptomatic states associated with artificial menopause   . Thyroid disease   . Type II or unspecified type diabetes mellitus without mention of complication, not stated as uncontrolled   . Unspecified sleep apnea     Past Surgical History:  Procedure Laterality Date  . abcess removal  1997   MRSA  . ABDOMINAL HYSTERECTOMY  1997   complete in 1997, partial was in 1995  . carpal tunnel release r  07/03/13   Dalldorf  . [place]  . CHOLECYSTECTOMY  03/1989  . COLONOSCOPY  03/21/2011   Normal.  [facility].  [name] KNEE  SURGERY  2006  . LAPAROSCOPIC GASTRIC BANDING  05/2009  . TONSILLECTOMY AND ADENOIDECTOMY  1974  . TUBAL LIGATION    . ULNAR TUNNEL RELEASE Right 02/05/2018   Procedure: CUBITAL TUNNEL RELEASE/DECOMPRESSION;  Surgeon: Melrose Nakayama, MD;  Location: [place];  Service: Orthopedics;  Laterality: Right;    There were no vitals filed for this visit.  Subjective Assessment - 09/03/18 1109    Subjective  Patient has been working on her HEP to get her LE's stronger. Her knees hurt 1/10 today.    Patient is accompained by:  Family member    Pertinent History   Patient fx her sacrum 9/18 and  she lost sensation to right foot, and she lost ability to DF her right foot. Patient had back surgery 03/2017 , she was sent home. She was walking with no device indoors and she could get out of the house and do the steps. June 18, 2017  her LLE was getting weaker and the right leg was getting stronger. Patient had follow up with neurosurgery. She was scheduled for a steriod shot Jul 23, 2017, but on May 3 her legs didnt  feel right, she fell 4 times, she  fractured her R fibula  on may 3rd, she went to hospital and spent one week. She went to SNF for 1 month. She had another neuro consult, resulting in kDx of  polyneuropathy. Then she had an apt with and [facility] said it was not polyneuropathy, (in [place])  She has a second consultation scheduled Dec 30 for a  Neuromuscular.consult.  She had a scan of the spine T 12-28-17  who then sent her to a thoracic surgeon at the end of November and was recommended to not due surgery. She does not know what her Dx is and she has been having home PT beginning August 25, 2017 . She has had HHPT 2 x  week for the last 5 months. She is able to walk 50 feet with RW. She is able to transfer from wc to stand independently.     How long can you stand comfortably?  5 mins    Patient Stated Goals  to walk without the RW, or walk better, use the bathroom    Currently in Pain?  Yes    Pain Score  1      Pain Location  Knee    Pain Orientation  Right;Left    Pain Descriptors / Indicators  Aching    Pain Onset  1 to 4 weeks ago        Treatment: Nu-step level 3 x 10 mins with vc for pulling in  Left LE to stay in neutral  Sit to stand with CGA to hold the chair TM walking . 2 miles/ hour x 5 mins , . 3 miles / min x 5 mins  Transfers from nu-step<> stand, wc<> stand with CGA Ascending and descending 2 steps with hand rail and RW and min assist   Patient has fatigue and needs several seated rest periods during treatment. Patient needs VC for correct technique.                      PT Education - 09/03/18 1110    Education provided  Yes    Education Details  HEP    Person(s) Educated  Patient    Methods  Explanation    Comprehension  Verbalized understanding;Returned demonstration;Need further instruction       PT Short Term Goals - 03/07/18 1345      PT SHORT TERM GOAL #1   Title  Patient will be independent in home exercise program to improve strength/mobility for better functional independence with ADLs.    Time  6    Period  Weeks    Status  New    Target Date  04/18/18        PT Long Term Goals - 08/06/18 1124      PT LONG TERM GOAL #1   Title  Patient will increase 10 meter walk test to >.50 m/s as to improve gait speed for better community ambulation and to reduce fall risk.    Time  8    Period  Weeks    Status  New    Target Date  10/29/18      PT LONG TERM GOAL #2   Title  Patient will transfer sit to stand  from 19 inch mat without assist or anyone supporting AD  to demonstrate improved LE strength to decrease falls risk.     Time  12    Period  Weeks    Status  New    Target Date  10/29/18  PT LONG TERM GOAL #3   Title  Patient will demonstrate stance without AD  x 1 min to demonstrate decreased falls risk.     Time  12    Period  Weeks    Status  New    Target Date  10/29/18      PT LONG TERM GOAL #4   Title  Patient will   ambulate 500 feet to work towards community ambulation distances.     Time  12    Period  Weeks    Status  New    Target Date  10/29/18              Patient will benefit from skilled therapeutic intervention in order to improve the following deficits and impairments:     Visit Diagnosis: 1. Muscle weakness (generalized)   2. Difficulty in walking, not elsewhere classified   3. Weakness of right leg   4. Pain in right ankle and joints of right foot   5. Pain in right hip        Problem List Patient Active Problem List   Diagnosis Date Noted  . Migraine without status migrainosus, not intractable   . Chronic pain syndrome   . Lumbar disc disease with radiculopathy 07/21/2017  . Lumbar foraminal stenosis 07/21/2017  . Unable to walk 07/21/2017  . Multiple falls 07/21/2017  . Displaced fracture of proximal end of right fibula 07/21/2017  . Lower extremity weakness 05/28/2017  . Right lower quadrant abdominal pain 08/22/2016  . Abnormal TSH 01/10/2016  . Morbid obesity ([place]) 01/12/2014  . Routine general medical examination at a health care facility 10/28/2012  . GERD (gastroesophageal reflux disease) 10/28/2012  . Anxiety 12/20/2011  . Allergic rhinitis 12/20/2011  . Asthma 12/20/2011  . Lapband APL with HH repair 03/31/2011  . Uncontrolled type 2 diabetes mellitus with hyperglycemia, with long-term current use of insulin ([place]) 06/30/2007  . Hyperlipidemia 06/30/2007  . Essential hypertension 06/27/2007    Alanson Puls, PT DPT 09/03/2018, 11:31 AM  [place] MAIN [facility] SERVICES [address] [place], [place], [address] Phone: [phone]   Fax:  [phone]  Name: [name] MRN: [id number] Date of Birth: October 02, 1965

## 2018-09-05 ENCOUNTER — Other Ambulatory Visit: Payer: Self-pay

## 2018-09-05 ENCOUNTER — Ambulatory Visit: Payer: BC Managed Care – PPO

## 2018-09-05 DIAGNOSIS — M25551 Pain in right hip: Secondary | ICD-10-CM

## 2018-09-05 DIAGNOSIS — M25571 Pain in right ankle and joints of right foot: Secondary | ICD-10-CM

## 2018-09-05 DIAGNOSIS — R29898 Other symptoms and signs involving the musculoskeletal system: Secondary | ICD-10-CM

## 2018-09-05 DIAGNOSIS — M6281 Muscle weakness (generalized): Secondary | ICD-10-CM | POA: Diagnosis not present

## 2018-09-05 DIAGNOSIS — R262 Difficulty in walking, not elsewhere classified: Secondary | ICD-10-CM

## 2018-09-05 DIAGNOSIS — J4520 Mild intermittent asthma, uncomplicated: Secondary | ICD-10-CM

## 2018-09-05 MED ORDER — ALBUTEROL SULFATE HFA 108 (90 BASE) MCG/ACT IN AERS: 1.0000 | INHALATION_SPRAY | Freq: Four times a day (QID) | RESPIRATORY_TRACT | 0 refills | Status: DC | PRN

## 2018-09-05 NOTE — Therapy (Signed)
[place] MAIN [facility] SERVICES [address] [place], [place], [address] Phone: [phone]   Fax:  [phone]  Physical Therapy Treatment  Patient Details  Name: [name] MRN: [id number] Date of Birth: Aug 15, 1965 Referring Provider (PT): Cornell Barman   Encounter Date: 09/05/2018  PT End of Session - 09/05/18 1204    Visit Number  9    Number of Visits  25    Date for PT Re-Evaluation  10/29/18    PT Start Time  1102    PT Stop Time  1144    PT Time Calculation (min)  42 min    Equipment Utilized During Treatment  Gait belt;Other (comment)   AFO   Activity Tolerance  Patient tolerated treatment well;Patient limited by fatigue    Behavior During Therapy  [facility] for tasks assessed/performed       Past Medical History:  Diagnosis Date  . Allergy   . Anemia   . Anxiety   . Arthritis   . Asthma   . Colon polyp   . Complication of anesthesia   . Diabetes mellitus   . Edema   . Fatigue   . Hypertension   . IBS (irritable bowel syndrome)   . Lump in female breast   . Methicillin resistant Staphylococcus aureus in conditions classified elsewhere and of unspecified site   . Migraines   . Morbid obesity ([place])   . Night sweats   . Nonspecific abnormal results of thyroid function study   . Other B-complex deficiencies   . Paresthesias   . PONV (postoperative nausea and vomiting)   . Pure hypercholesterolemia   . Reflux   . Sacral fracture ([place])   . Sinus problem   . Symptomatic states associated with artificial menopause   . Thyroid disease   . Type II or unspecified type diabetes mellitus without mention of complication, not stated as uncontrolled   . Unspecified sleep apnea     Past Surgical History:  Procedure Laterality Date  . abcess removal  1997   MRSA  . ABDOMINAL HYSTERECTOMY  1997   complete in 1997, partial was in 1995  . carpal tunnel release r  07/03/13   Dalldorf  . [place]  .  CHOLECYSTECTOMY  03/1989  . COLONOSCOPY  03/21/2011   Normal.  [facility].  [name] KNEE SURGERY  2006  . LAPAROSCOPIC GASTRIC BANDING  05/2009  . TONSILLECTOMY AND ADENOIDECTOMY  1974  . TUBAL LIGATION    . ULNAR TUNNEL RELEASE Right 02/05/2018   Procedure: CUBITAL TUNNEL RELEASE/DECOMPRESSION;  Surgeon: Melrose Nakayama, MD;  Location: [place];  Service: Orthopedics;  Laterality: Right;    There were no vitals filed for this visit.  Subjective Assessment - 09/05/18 1200    Subjective  Pt doing well in general continues to work toward HEP. Reports AFO countinue to facilitate easier AMB in home. Says her knees have been worse, wondering if because of weather related changes.    Pertinent History   Patient fx her sacrum 9/18 and  she lost sensation to right foot, and she lost ability to DF her right foot. Patient had back surgery 03/2017 , she was sent home. She was walking with no device indoors and she could get out of the house and do the steps. June 18, 2017  her LLE was getting weaker and the right leg was getting stronger. Patient had follow up with neurosurgery. She was scheduled for a steriod  shot Jul 23, 2017, but on May 3 her legs didnt  feel right, she fell 4 times, she fractured her R fibula  on may 3rd, she went to hospital and spent one week. She went to SNF for 1 month. She had another neuro consult, resulting in kDx of  polyneuropathy. Then she had an apt with and [facility] said it was not polyneuropathy, (in [place])  She has a second consultation scheduled Dec 30 for a  Neuromuscular.consult.  She had a scan of the spine T 12-28-17  who then sent her to a thoracic surgeon at the end of November and was recommended to not due surgery. She does not know what her Dx is and she has been having home PT beginning August 25, 2017 . She has had HHPT 2 x  week for the last 5 months. She is able to walk 50 feet with RW. She is able to transfer from wc to stand independently.     Currently in Pain?  Yes    Pain  Score  3     Pain Location  Knee    Pain Orientation  Left;Right    Pain Descriptors / Indicators  Aching    Pain Type  Chronic pain       INTERVENTION THIS DATE: -Nu-step x 5 mins level 3, Seat 10, Arms 9 -AMB 168ft c RW, AFO x2 on BLE (minGuard assist)  -Leg Press (seat hole 6): RLE extension 1x20 @50lb ; 1x20(18) @55lbs  (Pt's says "Tell Kristine I request this once each week. Best my knees have felt in a long time."  -Leg Press (seat hole 6): LLE extension 1x20 @30lb ; 1x15@35lb       -Retro AMB in //bars 2x18ft -High knee marching in // bars 2x20ft  -Lateral side-steppin gin //bars 1x20, VC for tall upright stance and 50% step length to promote use of gluteals over hip-flexor dominant swing phase.   PT Short Term Goals - 03/07/18 1345      PT SHORT TERM GOAL #1   Title  Patient will be independent in home exercise program to improve strength/mobility for better functional independence with ADLs.    Time  6    Period  Weeks    Status  New    Target Date  04/18/18        PT Long Term Goals - 08/06/18 1124      PT LONG TERM GOAL #1   Title  Patient will increase 10 meter walk test to >.50 m/s as to improve gait speed for better community ambulation and to reduce fall risk.    Time  8    Period  Weeks    Status  New    Target Date  10/29/18      PT LONG TERM GOAL #2   Title  Patient will transfer sit to stand  from 19 inch mat without assist or anyone supporting AD  to demonstrate improved LE strength to decrease falls risk.     Time  12    Period  Weeks    Status  New    Target Date  10/29/18      PT LONG TERM GOAL #3   Title  Patient will demonstrate stance without AD  x 1 min to demonstrate decreased falls risk.     Time  12    Period  Weeks    Status  New    Target Date  10/29/18      PT LONG TERM GOAL #4  Title  Patient will  ambulate 500 feet to work towards community ambulation distances.     Time  12    Period  Weeks    Status  New    Target Date   10/29/18            Plan - 09/05/18 1205    Clinical Impression Statement  Pt doing well in general this date. Reports her knees continues to feel more achy and stiff this week and requests she try leg press again as in March as this seemed to help her manage her knee pain. Noted progression in strength on leg press since last performance 3 months ago with increased tolerance to level of resistance and reps.    Rehab Potential  Good    Clinical Impairments Affecting Rehab Potential  Multiple tests concerning for neurologic involvement     PT Frequency  2x / week    PT Duration  12 weeks    PT Treatment/Interventions  Gait training;Therapeutic activities;Therapeutic exercise;Balance training;Neuromuscular re-education;Patient/family education;Orthotic Fit/Training;Manual techniques    PT Next Visit Plan  get updated feedback on pain after leg press    PT Home Exercise Plan  No updates this date     Consulted and Agree with Plan of Care  Patient       Patient will benefit from skilled therapeutic intervention in order to improve the following deficits and impairments:  Decreased balance, Decreased endurance, Decreased mobility, Difficulty walking, Impaired sensation, Decreased range of motion, Decreased activity tolerance, Decreased coordination, Decreased strength, Pain, Postural dysfunction  Visit Diagnosis: 1. Muscle weakness (generalized)   2. Difficulty in walking, not elsewhere classified   3. Weakness of right leg   4. Pain in right ankle and joints of right foot   5. Pain in right hip        Problem List Patient Active Problem List   Diagnosis Date Noted  . Migraine without status migrainosus, not intractable   . Chronic pain syndrome   . Lumbar disc disease with radiculopathy 07/21/2017  . Lumbar foraminal stenosis 07/21/2017  . Unable to walk 07/21/2017  . Multiple falls 07/21/2017  . Displaced fracture of proximal end of right fibula 07/21/2017  . Lower extremity  weakness 05/28/2017  . Right lower quadrant abdominal pain 08/22/2016  . Abnormal TSH 01/10/2016  . Morbid obesity ([place]) 01/12/2014  . Routine general medical examination at a health care facility 10/28/2012  . GERD (gastroesophageal reflux disease) 10/28/2012  . Anxiety 12/20/2011  . Allergic rhinitis 12/20/2011  . Asthma 12/20/2011  . Lapband APL with HH repair 03/31/2011  . Uncontrolled type 2 diabetes mellitus with hyperglycemia, with long-term current use of insulin ([place]) 06/30/2007  . Hyperlipidemia 06/30/2007  . Essential hypertension 06/27/2007   12:12 PM, 09/05/18 Etta Grandchild, PT, DPT Physical Therapist - [facility]  Outpatient Physical [place] [phone]     Etta Grandchild 09/05/2018, 12:10 PM  [place] MAIN [facility] SERVICES [address] [place], [place], [address] Phone: [phone]   Fax:  [phone]  Name: [name] MRN: [id number] Date of Birth: Jul 23, 1965

## 2018-09-10 ENCOUNTER — Ambulatory Visit: Payer: BC Managed Care – PPO | Admitting: Physical Therapy

## 2018-09-10 ENCOUNTER — Other Ambulatory Visit: Payer: Self-pay

## 2018-09-10 DIAGNOSIS — R262 Difficulty in walking, not elsewhere classified: Secondary | ICD-10-CM

## 2018-09-10 DIAGNOSIS — M6281 Muscle weakness (generalized): Secondary | ICD-10-CM | POA: Diagnosis not present

## 2018-09-10 DIAGNOSIS — M25551 Pain in right hip: Secondary | ICD-10-CM

## 2018-09-10 DIAGNOSIS — R29898 Other symptoms and signs involving the musculoskeletal system: Secondary | ICD-10-CM

## 2018-09-10 DIAGNOSIS — M25571 Pain in right ankle and joints of right foot: Secondary | ICD-10-CM

## 2018-09-10 NOTE — Therapy (Signed)
[place] MAIN [facility] SERVICES [address] [place], [place], [address] Phone: [phone]   Fax:  [phone]  Physical Therapy Treatment  Physical Therapy Progress Note   Dates of reporting period  08/06/18  to   09/10/18  Patient Details  Name: [name] MRN: [id number] Date of Birth: 08-Oct-1965 Referring Provider (PT): Cornell Barman   Encounter Date: 09/10/2018  PT End of Session - 09/10/18 1053    Visit Number  10    Number of Visits  25    Date for PT Re-Evaluation  10/29/18    PT Start Time  1100    PT Stop Time  1145    PT Time Calculation (min)  45 min    Equipment Utilized During Treatment  Gait belt;Other (comment)   AFO   Activity Tolerance  Patient tolerated treatment well;Patient limited by fatigue    Behavior During Therapy  [facility] for tasks assessed/performed       Past Medical History:  Diagnosis Date  . Allergy   . Anemia   . Anxiety   . Arthritis   . Asthma   . Colon polyp   . Complication of anesthesia   . Diabetes mellitus   . Edema   . Fatigue   . Hypertension   . IBS (irritable bowel syndrome)   . Lump in female breast   . Methicillin resistant Staphylococcus aureus in conditions classified elsewhere and of unspecified site   . Migraines   . Morbid obesity ([place])   . Night sweats   . Nonspecific abnormal results of thyroid function study   . Other B-complex deficiencies   . Paresthesias   . PONV (postoperative nausea and vomiting)   . Pure hypercholesterolemia   . Reflux   . Sacral fracture ([place])   . Sinus problem   . Symptomatic states associated with artificial menopause   . Thyroid disease   . Type II or unspecified type diabetes mellitus without mention of complication, not stated as uncontrolled   . Unspecified sleep apnea     Past Surgical History:  Procedure Laterality Date  . abcess removal  1997   MRSA  . ABDOMINAL HYSTERECTOMY  1997   complete in 1997, partial was in 1995  .  carpal tunnel release r  07/03/13   Dalldorf  . [place]  . CHOLECYSTECTOMY  03/1989  . COLONOSCOPY  03/21/2011   Normal.  [facility].  [name] KNEE SURGERY  2006  . LAPAROSCOPIC GASTRIC BANDING  05/2009  . TONSILLECTOMY AND ADENOIDECTOMY  1974  . TUBAL LIGATION    . ULNAR TUNNEL RELEASE Right 02/05/2018   Procedure: CUBITAL TUNNEL RELEASE/DECOMPRESSION;  Surgeon: Melrose Nakayama, MD;  Location: [place];  Service: Orthopedics;  Laterality: Right;    There were no vitals filed for this visit.     Treatment:  Goals reviewed and progressed; some were achieved.   OUTCOME MEASURES: TEST Outcome Interpretation  5 times sit<>stand 20.28 from wc and no assist sec >60 yo, >15 sec indicates increased risk for falls  10 meter walk test   . 36           m/s <1.0 m/s indicates increased risk for falls; limited community ambulator      6 minute walk test         270      Feet 1000 feet is community ambulator  leg press , 5 holes showing , 90 lbs 20 x 2   Patient required min-moderate verbal/tactile cues for correct exercise technique.                   PT Education - 09/10/18 1053    Education provided  Yes    Education Details  HEP    Person(s) Educated  Patient    Methods  Explanation    Comprehension  Verbalized understanding;Verbal cues required;Need further instruction       PT Short Term Goals - 03/07/18 1345      PT SHORT TERM GOAL #1   Title  Patient will be independent in home exercise program to improve strength/mobility for better functional independence with ADLs.    Time  6    Period  Weeks    Status  New    Target Date  04/18/18        PT Long Term Goals - 08/06/18 1124      PT LONG TERM GOAL #1   Title  Patient will increase 10 meter walk test to >.50 m/s as to improve gait speed for better community ambulation and to reduce fall risk.    Time  8    Period  Weeks    Status  New    Target Date   10/29/18      PT LONG TERM GOAL #2   Title  Patient will transfer sit to stand  from 19 inch mat without assist or anyone supporting AD  to demonstrate improved LE strength to decrease falls risk.     Time  12    Period  Weeks    Status  New    Target Date  10/29/18      PT LONG TERM GOAL #3   Title  Patient will demonstrate stance without AD  x 1 min to demonstrate decreased falls risk.     Time  12    Period  Weeks    Status  New    Target Date  10/29/18      PT LONG TERM GOAL #4   Title  Patient will  ambulate 500 feet to work towards community ambulation distances.     Time  12    Period  Weeks    Status  New    Target Date  10/29/18            Plan - 09/10/18 1054    Clinical Impression Statement  Patient's condition has the potential to improve in response to therapy. Maximum improvement is yet to be obtained. The anticipated improvement is attainable and reasonable in a generally predictable time.  Patient reports that she is able to get up and down easier and her gait is getting steady She has reached 2 goals and new goal has been assigned. She will continue to benefit from skilled PT to improve strength and mobility.     Rehab Potential  Good    Clinical Impairments Affecting Rehab Potential  Multiple tests concerning for neurologic involvement     PT Frequency  2x / week    PT Duration  12 weeks    PT Treatment/Interventions  Gait training;Therapeutic activities;Therapeutic exercise;Balance training;Neuromuscular re-education;Patient/family education;Orthotic Fit/Training;Manual techniques    PT Next Visit Plan  get updated feedback on pain after leg press    PT Home Exercise Plan  No updates this date     Consulted and Agree with Plan of Care  Patient  Patient will benefit from skilled therapeutic intervention in order to improve the following deficits and impairments:  Decreased balance, Decreased endurance, Decreased mobility, Difficulty walking, Impaired  sensation, Decreased range of motion, Decreased activity tolerance, Decreased coordination, Decreased strength, Pain, Postural dysfunction  Visit Diagnosis: 1. Muscle weakness (generalized)   2. Difficulty in walking, not elsewhere classified   3. Weakness of right leg   4. Pain in right ankle and joints of right foot   5. Pain in right hip        Problem List Patient Active Problem List   Diagnosis Date Noted  . Migraine without status migrainosus, not intractable   . Chronic pain syndrome   . Lumbar disc disease with radiculopathy 07/21/2017  . Lumbar foraminal stenosis 07/21/2017  . Unable to walk 07/21/2017  . Multiple falls 07/21/2017  . Displaced fracture of proximal end of right fibula 07/21/2017  . Lower extremity weakness 05/28/2017  . Right lower quadrant abdominal pain 08/22/2016  . Abnormal TSH 01/10/2016  . Morbid obesity ([place]) 01/12/2014  . Routine general medical examination at a health care facility 10/28/2012  . GERD (gastroesophageal reflux disease) 10/28/2012  . Anxiety 12/20/2011  . Allergic rhinitis 12/20/2011  . Asthma 12/20/2011  . Lapband APL with HH repair 03/31/2011  . Uncontrolled type 2 diabetes mellitus with hyperglycemia, with long-term current use of insulin ([place]) 06/30/2007  . Hyperlipidemia 06/30/2007  . Essential hypertension 06/27/2007    Alanson Puls, PT DPT 09/10/2018, 10:57 AM  [place] MAIN [facility] SERVICES [address] [place], [place], [address] Phone: [phone]   Fax:  [phone]  Name: [name] MRN: [id number] Date of Birth: 15-Aug-1965

## 2018-09-12 ENCOUNTER — Ambulatory Visit: Payer: BC Managed Care – PPO | Admitting: Physical Therapy

## 2018-09-17 ENCOUNTER — Ambulatory Visit: Payer: BC Managed Care – PPO | Admitting: Physical Therapy

## 2018-09-17 ENCOUNTER — Encounter: Payer: Self-pay | Admitting: Physical Therapy

## 2018-09-17 DIAGNOSIS — M6281 Muscle weakness (generalized): Secondary | ICD-10-CM | POA: Diagnosis not present

## 2018-09-17 DIAGNOSIS — M25571 Pain in right ankle and joints of right foot: Secondary | ICD-10-CM

## 2018-09-17 DIAGNOSIS — R29898 Other symptoms and signs involving the musculoskeletal system: Secondary | ICD-10-CM

## 2018-09-17 DIAGNOSIS — R262 Difficulty in walking, not elsewhere classified: Secondary | ICD-10-CM

## 2018-09-17 DIAGNOSIS — M25551 Pain in right hip: Secondary | ICD-10-CM

## 2018-09-17 NOTE — Therapy (Signed)
[place] MAIN [facility] SERVICES [address] [place], [place], [address] Phone: [phone]   Fax:  [phone]  Physical Therapy Treatment  Patient Details  Name: [name] MRN: [id number] Date of Birth: [birth year] Referring Provider (PT): Cornell Barman   Encounter Date: 09/17/2018  PT End of Session - 09/17/18 1100    Visit Number  11    Number of Visits  25    Date for PT Re-Evaluation  10/29/18    PT Start Time  1050    PT Stop Time  1135    PT Time Calculation (min)  45 min    Equipment Utilized During Treatment  Gait belt;Other (comment)   AFO   Activity Tolerance  Patient tolerated treatment well;Patient limited by fatigue    Behavior During Therapy  [facility] for tasks assessed/performed       Past Medical History:  Diagnosis Date  . Allergy   . Anemia   . Anxiety   . Arthritis   . Asthma   . Colon polyp   . Complication of anesthesia   . Diabetes mellitus   . Edema   . Fatigue   . Hypertension   . IBS (irritable bowel syndrome)   . Lump in female breast   . Methicillin resistant Staphylococcus aureus in conditions classified elsewhere and of unspecified site   . Migraines   . Morbid obesity ([place])   . Night sweats   . Nonspecific abnormal results of thyroid function study   . Other B-complex deficiencies   . Paresthesias   . PONV (postoperative nausea and vomiting)   . Pure hypercholesterolemia   . Reflux   . Sacral fracture ([place])   . Sinus problem   . Symptomatic states associated with artificial menopause   . Thyroid disease   . Type II or unspecified type diabetes mellitus without mention of complication, not stated as uncontrolled   . Unspecified sleep apnea     Past Surgical History:  Procedure Laterality Date  . abcess removal  1997   MRSA  . ABDOMINAL HYSTERECTOMY  1997   complete in 1997, partial was in 1995  . carpal tunnel release r  07/03/13   Dalldorf  . [place]  .  CHOLECYSTECTOMY  03/1989  . COLONOSCOPY  03/21/2011   Normal.  [facility].  [name] KNEE SURGERY  2006  . LAPAROSCOPIC GASTRIC BANDING  05/2009  . TONSILLECTOMY AND ADENOIDECTOMY  1974  . TUBAL LIGATION    . ULNAR TUNNEL RELEASE Right 02/05/2018   Procedure: CUBITAL TUNNEL RELEASE/DECOMPRESSION;  Surgeon: Melrose Nakayama, MD;  Location: [place];  Service: Orthopedics;  Laterality: Right;    There were no vitals filed for this visit.  Subjective Assessment - 09/17/18 1055    Subjective  Patient says that she went to the specialist about the ALS and it is inconclusive with the DX.    Currently in Pain?  Yes    Pain Score  2     Pain Location  Knee    Pain Orientation  Right;Left    Pain Descriptors / Indicators  Aching    Pain Type  Chronic pain    Pain Onset  More than a month ago    Pain Frequency  Intermittent       Treatment: Nu-step x 5 mins L 4  sidelying hip abd x 15 x 2 sets BLE Clam sidelying x 15 x 2 sets BLE Hooklying marching x 15  x 2, assist with LLE SLR x 10 BLE with assist on LLE x 2 sets Bridging x 10 x 2 with assist to LE's for stabilization   Cues for correct technique and min assist with LLE during SLR and repositioning                       PT Education - 09/17/18 1059    Education provided  Yes    Education Details  HEP    Person(s) Educated  Patient    Methods  Explanation    Comprehension  Verbalized understanding;Returned demonstration       PT Short Term Goals - 09/10/18 1058      PT SHORT TERM GOAL #1   Title  Patient will be independent in home exercise program to improve strength/mobility for better functional independence with ADLs.    Time  6    Period  Weeks    Status  Partially Met    Target Date  09/10/18        PT Long Term Goals - 09/10/18 1059      PT LONG TERM GOAL #1   Title  Patient will increase 10 meter walk test to >.50 m/s as to improve gait speed for better community ambulation and to reduce fall risk.     Baseline  . 36 m/sec    Time  8    Period  Weeks    Status  Partially Met    Target Date  11/05/18      PT LONG TERM GOAL #2   Title  Patient will transfer sit to stand  from 19 inch mat without assist or anyone supporting AD  to demonstrate improved LE strength to decrease falls risk.     Time  12    Period  Weeks    Status  Achieved    Target Date  09/10/18      PT LONG TERM GOAL #3   Title  Patient will demonstrate stance without AD  x 1 min to demonstrate decreased falls risk.     Time  12    Period  Weeks    Status  Achieved    Target Date  09/10/18      PT LONG TERM GOAL #4   Title  Patient will  ambulate 500 feet to work towards community ambulation distances.     Baseline  270 feet    Time  12    Period  Weeks    Status  New    Target Date  10/30/18      PT LONG TERM GOAL #5   Title  Patient (< 37 years old) will complete five times sit to stand test in < 10 seconds indicating an increased LE strength and improved balance.    Baseline  20.28 m/sec    Time  12    Period  Weeks    Status  New    Target Date  12/03/18            Plan - 09/17/18 1104    Clinical Impression Statement  Pt requires direction and verbal cues for correct performance of exercise and activities. Patient demonstrates difficulties strengthening against gravity and closed chain activities and has no reports of pain increases . Pt was able to perform all exercises with min assist and VC for technique.   Patient struggles with fatigue and repetitions.  Pt encouraged continuing HEP. Follow-up as scheduled.    Rehab  Potential  Good    Clinical Impairments Affecting Rehab Potential  Multiple tests concerning for neurologic involvement     PT Frequency  2x / week    PT Duration  12 weeks    PT Treatment/Interventions  Gait training;Therapeutic activities;Therapeutic exercise;Balance training;Neuromuscular re-education;Patient/family education;Orthotic Fit/Training;Manual techniques    PT Next  Visit Plan  get updated feedback on pain after leg press    PT Home Exercise Plan  No updates this date     Consulted and Agree with Plan of Care  Patient       Patient will benefit from skilled therapeutic intervention in order to improve the following deficits and impairments:  Decreased balance, Decreased endurance, Decreased mobility, Difficulty walking, Impaired sensation, Decreased range of motion, Decreased activity tolerance, Decreased coordination, Decreased strength, Pain, Postural dysfunction  Visit Diagnosis: 1. Muscle weakness (generalized)   2. Difficulty in walking, not elsewhere classified   3. Weakness of right leg   4. Pain in right ankle and joints of right foot   5. Pain in right hip        Problem List Patient Active Problem List   Diagnosis Date Noted  . Migraine without status migrainosus, not intractable   . Chronic pain syndrome   . Lumbar disc disease with radiculopathy 07/21/2017  . Lumbar foraminal stenosis 07/21/2017  . Unable to walk 07/21/2017  . Multiple falls 07/21/2017  . Displaced fracture of proximal end of right fibula 07/21/2017  . Lower extremity weakness 05/28/2017  . Right lower quadrant abdominal pain 08/22/2016  . Abnormal TSH 01/10/2016  . Morbid obesity ([place]) 01/12/2014  . Routine general medical examination at a health care facility 10/28/2012  . GERD (gastroesophageal reflux disease) 10/28/2012  . Anxiety 12/20/2011  . Allergic rhinitis 12/20/2011  . Asthma 12/20/2011  . Lapband APL with HH repair 03/31/2011  . Uncontrolled type 2 diabetes mellitus with hyperglycemia, with long-term current use of insulin ([place]) 06/30/2007  . Hyperlipidemia 06/30/2007  . Essential hypertension 06/27/2007    Alanson Puls, PT DPT 09/17/2018, 11:07 AM  [place] MAIN [facility] SERVICES [address] [place], [place], [address] Phone: [phone]   Fax:  [phone]  Name: GAURI GALVAO MRN:  [id number] Date of Birth: 09-Jan-1966

## 2018-09-18 ENCOUNTER — Other Ambulatory Visit: Payer: Self-pay | Admitting: Primary Care

## 2018-09-18 DIAGNOSIS — R29898 Other symptoms and signs involving the musculoskeletal system: Secondary | ICD-10-CM

## 2018-09-19 ENCOUNTER — Other Ambulatory Visit: Payer: Self-pay

## 2018-09-19 ENCOUNTER — Encounter: Payer: Self-pay | Admitting: Physical Therapy

## 2018-09-19 ENCOUNTER — Ambulatory Visit: Payer: BC Managed Care – PPO | Attending: Primary Care | Admitting: Physical Therapy

## 2018-09-19 DIAGNOSIS — M25551 Pain in right hip: Secondary | ICD-10-CM | POA: Diagnosis present

## 2018-09-19 DIAGNOSIS — M6281 Muscle weakness (generalized): Secondary | ICD-10-CM | POA: Diagnosis present

## 2018-09-19 DIAGNOSIS — R278 Other lack of coordination: Secondary | ICD-10-CM | POA: Diagnosis present

## 2018-09-19 DIAGNOSIS — R29898 Other symptoms and signs involving the musculoskeletal system: Secondary | ICD-10-CM | POA: Insufficient documentation

## 2018-09-19 DIAGNOSIS — R262 Difficulty in walking, not elsewhere classified: Secondary | ICD-10-CM | POA: Diagnosis not present

## 2018-09-19 DIAGNOSIS — M25571 Pain in right ankle and joints of right foot: Secondary | ICD-10-CM | POA: Insufficient documentation

## 2018-09-19 NOTE — Therapy (Signed)
[place] MAIN [facility] SERVICES [address] [place], [place], [address] Phone: [phone]   Fax:  [phone]  Physical Therapy Treatment  Patient Details  Name: [name] MRN: [id number] Date of Birth: [birth year] Referring Provider (PT): Cornell Barman   Encounter Date: 09/19/2018  PT End of Session - 09/19/18 1125    Visit Number  12    Number of Visits  25    Date for PT Re-Evaluation  10/29/18    PT Start Time  1115    PT Stop Time  1200    PT Time Calculation (min)  45 min    Equipment Utilized During Treatment  Gait belt;Other (comment)   AFO   Activity Tolerance  Patient tolerated treatment well;Patient limited by fatigue    Behavior During Therapy  [facility] for tasks assessed/performed       Past Medical History:  Diagnosis Date  . Allergy   . Anemia   . Anxiety   . Arthritis   . Asthma   . Colon polyp   . Complication of anesthesia   . Diabetes mellitus   . Edema   . Fatigue   . Hypertension   . IBS (irritable bowel syndrome)   . Lump in female breast   . Methicillin resistant Staphylococcus aureus in conditions classified elsewhere and of unspecified site   . Migraines   . Morbid obesity ([place])   . Night sweats   . Nonspecific abnormal results of thyroid function study   . Other B-complex deficiencies   . Paresthesias   . PONV (postoperative nausea and vomiting)   . Pure hypercholesterolemia   . Reflux   . Sacral fracture ([place])   . Sinus problem   . Symptomatic states associated with artificial menopause   . Thyroid disease   . Type II or unspecified type diabetes mellitus without mention of complication, not stated as uncontrolled   . Unspecified sleep apnea     Past Surgical History:  Procedure Laterality Date  . abcess removal  1997   MRSA  . ABDOMINAL HYSTERECTOMY  1997   complete in 1997, partial was in 1995  . carpal tunnel release r  07/03/13   Dalldorf  . [place]  .  CHOLECYSTECTOMY  03/1989  . COLONOSCOPY  03/21/2011   Normal.  [facility].  [name] KNEE SURGERY  2006  . LAPAROSCOPIC GASTRIC BANDING  05/2009  . TONSILLECTOMY AND ADENOIDECTOMY  1974  . TUBAL LIGATION    . ULNAR TUNNEL RELEASE Right 02/05/2018   Procedure: CUBITAL TUNNEL RELEASE/DECOMPRESSION;  Surgeon: Melrose Nakayama, MD;  Location: [place];  Service: Orthopedics;  Laterality: Right;    There were no vitals filed for this visit.  Subjective Assessment - 09/19/18 1124    Subjective  Patient says that she is not feeling 100% great today.    Patient is accompained by:  Family member    Pertinent History   Patient fx her sacrum 9/18 and  she lost sensation to right foot, and she lost ability to DF her right foot. Patient had back surgery 03/2017 , she was sent home. She was walking with no device indoors and she could get out of the house and do the steps. June 18, 2017  her LLE was getting weaker and the right leg was getting stronger. Patient had follow up with neurosurgery. She was scheduled for a steriod shot Jul 23, 2017, but on May 3 her legs didnt  feel right, she fell 4 times, she fractured her R fibula  on may 3rd, she went to hospital and spent one week. She went to SNF for 1 month. She had another neuro consult, resulting in kDx of  polyneuropathy. Then she had an apt with and [facility] said it was not polyneuropathy, (in [place])  She has a second consultation scheduled Dec 30 for a  Neuromuscular.consult.  She had a scan of the spine T 12-28-17  who then sent her to a thoracic surgeon at the end of November and was recommended to not due surgery. She does not know what her Dx is and she has been having home PT beginning August 25, 2017 . She has had HHPT 2 x  week for the last 5 months. She is able to walk 50 feet with RW. She is able to transfer from wc to stand independently.     How long can you stand comfortably?  5 mins    Patient Stated Goals  to walk without the RW, or walk better, use the bathroom     Currently in Pain?  Yes    Pain Score  5     Pain Location  Knee    Pain Orientation  Right    Pain Onset  More than a month ago       Treatment: Nu-step x 5 mins L 4   SAQ with 4 lbs RLE, 2 lbs LLE x 20 with 5 sec hold  Hooklying marching with 4 lbs RLE, 2 lbs LLE x 15 x 2 sets and assist with LLE  Bridges x 10 x 2 sets  Hamstring stretching x 30 sec x 3 sets BLE  BLE DF stretch x 30 sec x 3 sets BLE  Added stretching to HEP: DF, hamstring bilaterally   Pt educated throughout session about proper posture and technique with exercises. Improved exercise technique, movement at target joints, use of target muscles after min to mod verbal, visual, tactile cues.                      PT Education - 09/19/18 1125    Education provided  Yes    Education Details  HEP    Person(s) Educated  Patient    Methods  Explanation;Demonstration    Comprehension  Verbalized understanding;Returned demonstration;Need further instruction       PT Short Term Goals - 09/10/18 1058      PT SHORT TERM GOAL #1   Title  Patient will be independent in home exercise program to improve strength/mobility for better functional independence with ADLs.    Time  6    Period  Weeks    Status  Partially Met    Target Date  09/10/18        PT Long Term Goals - 09/10/18 1059      PT LONG TERM GOAL #1   Title  Patient will increase 10 meter walk test to >.50 m/s as to improve gait speed for better community ambulation and to reduce fall risk.    Baseline  . 36 m/sec    Time  8    Period  Weeks    Status  Partially Met    Target Date  11/05/18      PT LONG TERM GOAL #2   Title  Patient will transfer sit to stand  from 19 inch mat without assist or anyone supporting AD  to demonstrate improved LE strength to decrease falls  risk.     Time  12    Period  Weeks    Status  Achieved    Target Date  09/10/18      PT LONG TERM GOAL #3   Title  Patient will demonstrate stance  without AD  x 1 min to demonstrate decreased falls risk.     Time  12    Period  Weeks    Status  Achieved    Target Date  09/10/18      PT LONG TERM GOAL #4   Title  Patient will  ambulate 500 feet to work towards community ambulation distances.     Baseline  270 feet    Time  12    Period  Weeks    Status  New    Target Date  10/30/18      PT LONG TERM GOAL #5   Title  Patient (< 16 years old) will complete five times sit to stand test in < 10 seconds indicating an increased LE strength and improved balance.    Baseline  20.28 m/sec    Time  12    Period  Weeks    Status  New    Target Date  12/03/18            Plan - 09/19/18 1127    Clinical Impression Statement  Pt requires direction and verbal cues for correct performance of exercises. Patient demonstrates weakness in BUE and performs open and closed chain exercises with no reports of pain. Pt was able to perform all exercises with min assist and VC for technique. Pt encouraged continuing HEP .Follow-up as scheduled.    Rehab Potential  Good    Clinical Impairments Affecting Rehab Potential  Multiple tests concerning for neurologic involvement     PT Frequency  2x / week    PT Duration  12 weeks    PT Treatment/Interventions  Gait training;Therapeutic activities;Therapeutic exercise;Balance training;Neuromuscular re-education;Patient/family education;Orthotic Fit/Training;Manual techniques    PT Next Visit Plan  get updated feedback on pain after leg press    PT Home Exercise Plan  No updates this date     Consulted and Agree with Plan of Care  Patient       Patient will benefit from skilled therapeutic intervention in order to improve the following deficits and impairments:  Decreased balance, Decreased endurance, Decreased mobility, Difficulty walking, Impaired sensation, Decreased range of motion, Decreased activity tolerance, Decreased coordination, Decreased strength, Pain, Postural dysfunction  Visit  Diagnosis: 1. Difficulty in walking, not elsewhere classified   2. Muscle weakness (generalized)   3. Weakness of right leg   4. Pain in right ankle and joints of right foot   5. Pain in right hip        Problem List Patient Active Problem List   Diagnosis Date Noted  . Migraine without status migrainosus, not intractable   . Chronic pain syndrome   . Lumbar disc disease with radiculopathy 07/21/2017  . Lumbar foraminal stenosis 07/21/2017  . Unable to walk 07/21/2017  . Multiple falls 07/21/2017  . Displaced fracture of proximal end of right fibula 07/21/2017  . Lower extremity weakness 05/28/2017  . Right lower quadrant abdominal pain 08/22/2016  . Abnormal TSH 01/10/2016  . Morbid obesity ([place]) 01/12/2014  . Routine general medical examination at a health care facility 10/28/2012  . GERD (gastroesophageal reflux disease) 10/28/2012  . Anxiety 12/20/2011  . Allergic rhinitis 12/20/2011  . Asthma 12/20/2011  . Lapband APL  with HH repair 03/31/2011  . Uncontrolled type 2 diabetes mellitus with hyperglycemia, with long-term current use of insulin ([place]) 06/30/2007  . Hyperlipidemia 06/30/2007  . Essential hypertension 06/27/2007    Alanson Puls 09/19/2018, 11:38 AM  [place] MAIN [facility] SERVICES [address] [place], [place], [address] Phone: [phone]   Fax:  [phone]  Name: BEULAH CAPOBIANCO MRN: [id number] Date of Birth: 18-Mar-1966

## 2018-09-23 DIAGNOSIS — G1221 Amyotrophic lateral sclerosis: Secondary | ICD-10-CM

## 2018-09-23 HISTORY — DX: Amyotrophic lateral sclerosis: G12.21

## 2018-09-24 ENCOUNTER — Ambulatory Visit: Payer: BC Managed Care – PPO | Admitting: Occupational Therapy

## 2018-09-24 ENCOUNTER — Encounter: Payer: Self-pay | Admitting: Physical Therapy

## 2018-09-24 ENCOUNTER — Other Ambulatory Visit: Payer: Self-pay

## 2018-09-24 ENCOUNTER — Encounter: Payer: Self-pay | Admitting: Occupational Therapy

## 2018-09-24 ENCOUNTER — Ambulatory Visit: Payer: BC Managed Care – PPO | Admitting: Physical Therapy

## 2018-09-24 DIAGNOSIS — M25551 Pain in right hip: Secondary | ICD-10-CM

## 2018-09-24 DIAGNOSIS — M6281 Muscle weakness (generalized): Secondary | ICD-10-CM

## 2018-09-24 DIAGNOSIS — R262 Difficulty in walking, not elsewhere classified: Secondary | ICD-10-CM | POA: Diagnosis not present

## 2018-09-24 DIAGNOSIS — R278 Other lack of coordination: Secondary | ICD-10-CM

## 2018-09-24 DIAGNOSIS — M25571 Pain in right ankle and joints of right foot: Secondary | ICD-10-CM

## 2018-09-24 DIAGNOSIS — R29898 Other symptoms and signs involving the musculoskeletal system: Secondary | ICD-10-CM

## 2018-09-24 NOTE — Therapy (Signed)
[place] MAIN [facility] SERVICES [address] [place], [place], [address] Phone: [phone]   Fax:  [phone]  Occupational Therapy Evaluation  Patient Details  Name: [name] MRN: [id number] Date of Birth: 1965-05-18 Referring Provider (OT): Alma Friendly, MD   Encounter Date: 09/24/2018  OT End of Session - 09/24/18 1119    Visit Number  1    Number of Visits  24    Date for OT Re-Evaluation  12/17/18    Authorization Type  [employer] start date eval 09-24-18    Authorization Time Period  09/24/18-12/17/18    OT Start Time  1000    OT Stop Time  1100    OT Time Calculation (min)  60 min    Activity Tolerance  Patient tolerated treatment well    Behavior During Therapy  [facility] for tasks assessed/performed       Past Medical History:  Diagnosis Date  . Allergy   . Amyotrophic lateral sclerosis (ALS) ([place]) 09/23/2018  . Anemia   . Anxiety   . Arthritis   . Asthma   . Colon polyp   . Complication of anesthesia   . Diabetes mellitus   . Edema   . Fatigue   . Hypertension   . IBS (irritable bowel syndrome)   . Lump in female breast   . Methicillin resistant Staphylococcus aureus in conditions classified elsewhere and of unspecified site   . Migraines   . Morbid obesity ([place])   . Night sweats   . Nonspecific abnormal results of thyroid function study   . Other B-complex deficiencies   . Paresthesias   . PONV (postoperative nausea and vomiting)   . Pure hypercholesterolemia   . Reflux   . Sacral fracture ([place])   . Sinus problem   . Symptomatic states associated with artificial menopause   . Thyroid disease   . Type II or unspecified type diabetes mellitus without mention of complication, not stated as uncontrolled   . Unspecified sleep apnea     Past Surgical History:  Procedure Laterality Date  . abcess removal  1997   MRSA  . ABDOMINAL HYSTERECTOMY  1997   complete in 1997, partial was in 1995  . carpal tunnel release r   07/03/13   Dalldorf  . [place]  . CHOLECYSTECTOMY  03/1989  . COLONOSCOPY  03/21/2011   Normal.  [facility].  [name] KNEE SURGERY  2006  . LAPAROSCOPIC GASTRIC BANDING  05/2009  . TONSILLECTOMY AND ADENOIDECTOMY  1974  . TUBAL LIGATION    . ULNAR TUNNEL RELEASE Right 02/05/2018   Procedure: CUBITAL TUNNEL RELEASE/DECOMPRESSION;  Surgeon: Melrose Nakayama, MD;  Location: [place];  Service: Orthopedics;  Laterality: Right;    There were no vitals filed for this visit.  Subjective Assessment - 09/24/18 1014    Subjective   "I was just diagnosed yesterday with ALS and it is difficult for me to talk about."    Patient is accompanied by:  Family member    Pertinent History  [name] was recently diagnosed with ALS yesterday at [facility] [facility].  She has had a recent decrease in strength in L side of body, UE and LE with decreased coordination.  She feels like she is dropping cups more and needs to use both hands for hand to mouth and handwriting has decreased to almost illegible.    Limitations  weakness    Patient Stated Goals  I want  to work on handwriting, being able to hold a cup again and regain strength and coordination in both hands.    Currently in Pain?  No/denies        [facility] OT Assessment - 09/24/18 1021      Assessment   Medical Diagnosis  ALS, weakness    Referring Provider (OT)  Alma Friendly, MD    Onset Date/Surgical Date  11/18/16    Hand Dominance  Right    Prior Therapy  --   SNF May 2019     Precautions   Required Braces or Orthoses  Other Brace/Splint    Other Brace/Splint  --   Bilateral AFOs and R resting hand splint at night     Balance Screen   Has the patient fallen in the past 6 months  Yes    How many times?  1    Has the patient had a decrease in activity level because of a fear of falling?   Yes    Is the patient reluctant to leave their home because of a fear of falling?   No      Home  Environment   Alternate Level Stairs -  Number of Steps  --   ramps   Bathroom Shower/Tub  Tub only;Other (comment)    Adaptive equipment  Reacher    Lives With  Spouse      Prior Function   Level of Independence  Requires assistive device for independence;Needs assistance with ADLs;Needs assistance with homemaking    Vocation  Retired    Leisure  --   plays with grandchildren     ADL   Eating/Feeding  Minimal assistance    Eating/Feeding Patient Percentage  80%    Where assessed - Eating/feeding  Chair    Grooming  Teeth care    Grooming Patient Percentage  90%    Where Assess - Grooming  Supported sitting    Upper Body Bathing  Supervision/safety    Upper Body Bathing: Patient Percentage  90%    Where Assesed Upper Body Bathing  Supported sitting    Lower Body Bathing  Supervision/safety    Lower Body Bathing Patient Percentage  80%    Where Assesed-Lower Body Bathing  Supported sitting    Upper Body Dressing  Minimal assistance    Upper Body Dressing Details  --   assist with bra   Upper Body Dressing Patient Percentage  80%    Lower Body Dressing  Moderate assistance    Lower Body Dressing Patient Percentage  50%    Toilet Transfer  Moderate assistance    Toilet Transfer: Patient Percentage  50%    Toilet Transfer Method  Stand pivot    Toilet Transer Equipment  Comfort height toilet    Toileting - Clothing Manipulation  Minimal assistance    Toileting -  Hygiene  Maximal assistance;+ 1 Total assistance    Toileting - Hygiene Details ( indicate cue type & reason  --   husband has to wipe after BM, able to do after urinating   Clinical research associate Details (indicate cue type and reason  --   handicapped tub with transfer tub bench and HH shower   Tub/Shower Transfer: Patient Percentage  90%    Tub/Shower Transfer Method  Stand pivot    Equipment Used  Rolling walker   transfer tub bench     Vision - History   Baseline Vision  Wears glasses all the  time    Patient  Visual Report  Unable to keep objects in focus    Additional Comments  --   dizziness reported as well; decreased clarity with distance      Cognition   Overall Cognitive Status  Within Functional Limits for tasks assessed    Memory  Appears intact    Awareness  Appears intact    Problem Solving  Appears intact    Executive Function  Reasoning    Reasoning  Appears intact      Posture/Postural Control   Posture/Postural Control  Postural limitations    Postural Limitations  Anterior pelvic tilt    Posture Comments  --   Pt relies on WC for supported sitting     Sensation   Light Touch  Appears Intact    Hot/Cold  Appears Intact      Coordination   Coordination and Movement Description  --   Decreased fluidity of movement and rapid alternating movemen   9 Hole Peg Test  Right    Right 9 Hole Peg Test  32 seconds R, 38 seconds on L     Other  --   ADLs limited due to weakness/coord; needs extra time   Coordination  --   decreased dynamic balance     ROM / Strength   AROM / PROM / Strength  AROM      AROM   Overall AROM   --   Full AROM BUEs with decreased extension in R 4th and 5th fin     Hand Function   Right Hand Grip (lbs)  11#    Right Hand Lateral Pinch  --   4#   Right Hand 3 Point Pinch  --   2#   Left Hand Gross Grasp  --   9#   Left Hand Grip (lbs)  9    Left Hand Lateral Pinch  5 lbs    Left 3 point pinch  3 lbs    Comment  2 pt R 4#    2pt  L 2#                      OT Education - 09/24/18 1118    Education Details  Pt given red medium theraputty    Person(s) Educated  Patient    Methods  Explanation;Demonstration;Verbal cues    Comprehension  Verbalized understanding;Returned demonstration          OT Long Term Goals - 09/24/18 1133      OT LONG TERM GOAL #1   Title  Pt will complete self feeding using adaptive utensils and compenstory techniques using R hand.    Baseline  Pt needs help with holding cup, cutting meat and  uses L hand as assist.    Time  12    Period  Weeks    Status  New    Target Date  12/17/18      OT LONG TERM GOAL #2   Title  Pt will complete LB dressing using adaptive equipment with min assist and cues in unsupported sitting.    Baseline  Pt is dependent on her husband for socks, shoes, AFOs and pants over hips 50% of time.    Time  12    Period  Weeks    Status  New    Target Date  12/17/18      OT LONG TERM GOAL #3   Title  Pt will complete toileting and hygiene  with adaptive aid with min assist and cues on elevated toilet with use of grab bar and FWW for balance.    Baseline  Pt unable to complete hygiene after BM; able to complete Independently after urinating.    Time  12    Period  Weeks    Status  New    Target Date  12/17/18      OT LONG TERM GOAL #4   Title  Pt will be educated in HEP for strengtening and fine motor coordination.    Baseline  Pt does not have any HEP for BUEs or hands.    Time  12    Period  Weeks    Status  New    Target Date  12/17/18      OT LONG TERM GOAL #5   Title  Pt will increase strength  in B hands for use during ADLs by 4#.    Time  12    Period  Weeks    Status  New    Target Date  12/17/18      Long Term Additional Goals   Additional Long Term Goals  Yes      OT LONG TERM GOAL #6   Title  Pt will increase strength 2# in B pinch for lateral and 3 point for use during ADLs.    Baseline  R 4# L 3#    Time  12    Period  Weeks    Status  New    Target Date  12/17/18            Plan - 09/24/18 1124    Clinical Impression Statement  [name] was seen for OT evaluation this date and presents with new diagnosis of ALS as of 09-23-18 at [place] [facility].  She was tearful discussing this but is optimistic and motivated to maintain and increase strength and function in BUEs and LEs for ADLs.  She presents with decreased strength, coordination and balance and uses a FWW for all mobility.  She is able to complete grooming skills  sitting and needs assist for cutting meat, holding a fork, spoon and cup.  Min assist for donning and doffing bra and max assist for LB dressing skills.  She would benefit from ed and training in adaptive equip for LB dressing, strengthening, fine motor skills training.    OT Occupational Profile and History  Comprehensive Assessment- Review of records and extensive additional review of physical, cognitive, psychosocial history related to current functional performance    Occupational performance deficits (Please refer to evaluation for details):  ADL's;IADL's;Education;Leisure    Body Structure / Function / Physical Skills  Strength;Gait;FMC;IADL;Balance;ADL;Coordination;Endurance;UE functional use;Mobility    Rehab Potential  Good    Clinical Decision Making  Several treatment options, min-mod task modification necessary    Comorbidities Affecting Occupational Performance:  Presence of comorbidities impacting occupational performance    Comorbidities impacting occupational performance description:  progressive muscle weakness, balance, coordination    Modification or Assistance to Complete Evaluation   Min-Moderate modification of tasks or assist with assess necessary to complete eval    OT Frequency  2x / week    OT Duration  12 weeks    OT Treatment/Interventions  Moist Heat;Self-care/ADL training;Therapeutic exercise;Patient/family education;Energy conservation;Therapist, nutritional;Therapeutic activities;Balance training;DME and/or AE instruction;Manual Therapy;Psychosocial skills training    Plan  Rec continued OT 2x per week for 12 weeks    OT Home Exercise Plan  Pt given medium/red theraputty for home  strengthening exercises    Consulted and Agree with Plan of Care  Patient       Patient will benefit from skilled therapeutic intervention in order to improve the following deficits and impairments:   Body Structure / Function / Physical Skills: Strength, Gait, FMC, IADL, Balance,  ADL, Coordination, Endurance, UE functional use, Mobility       Visit Diagnosis: 1. Muscle weakness (generalized)   2. Other lack of coordination       Problem List Patient Active Problem List   Diagnosis Date Noted  . Migraine without status migrainosus, not intractable   . Chronic pain syndrome   . Lumbar disc disease with radiculopathy 07/21/2017  . Lumbar foraminal stenosis 07/21/2017  . Unable to walk 07/21/2017  . Multiple falls 07/21/2017  . Displaced fracture of proximal end of right fibula 07/21/2017  . Lower extremity weakness 05/28/2017  . Right lower quadrant abdominal pain 08/22/2016  . Abnormal TSH 01/10/2016  . Morbid obesity ([place]) 01/12/2014  . Routine general medical examination at a health care facility 10/28/2012  . GERD (gastroesophageal reflux disease) 10/28/2012  . Anxiety 12/20/2011  . Allergic rhinitis 12/20/2011  . Asthma 12/20/2011  . Lapband APL with HH repair 03/31/2011  . Uncontrolled type 2 diabetes mellitus with hyperglycemia, with long-term current use of insulin ([place]) 06/30/2007  . Hyperlipidemia 06/30/2007  . Essential hypertension 06/27/2007    Chrys Racer, OTR/L, [facility] ascom [phone] 09/24/18, 12:12 PM  [place] [address] [place], [place], [address] Phone: [phone]   Fax:  [phone]  Name: [name] MRN: [id number] Date of Birth: [birth year]

## 2018-09-24 NOTE — Therapy (Signed)
[place] MAIN [facility] SERVICES [address] [place], [place], [address] Phone: [phone]   Fax:  [phone]  Physical Therapy Treatment  Patient Details  Name: [name] MRN: [id number] Date of Birth: Jul 09, 1965 Referring Provider (PT): Cornell Barman   Encounter Date: 09/24/2018  PT End of Session - 09/24/18 1112    Visit Number  13    Number of Visits  25    Date for PT Re-Evaluation  10/29/18    PT Start Time  1102    PT Stop Time  1145    PT Time Calculation (min)  43 min    Equipment Utilized During Treatment  Gait belt;Other (comment)   AFO   Activity Tolerance  Patient tolerated treatment well;Patient limited by fatigue    Behavior During Therapy  [facility] for tasks assessed/performed       Past Medical History:  Diagnosis Date  . Allergy   . Amyotrophic lateral sclerosis (ALS) ([place]) 09/23/2018  . Anemia   . Anxiety   . Arthritis   . Asthma   . Colon polyp   . Complication of anesthesia   . Diabetes mellitus   . Edema   . Fatigue   . Hypertension   . IBS (irritable bowel syndrome)   . Lump in female breast   . Methicillin resistant Staphylococcus aureus in conditions classified elsewhere and of unspecified site   . Migraines   . Morbid obesity ([place])   . Night sweats   . Nonspecific abnormal results of thyroid function study   . Other B-complex deficiencies   . Paresthesias   . PONV (postoperative nausea and vomiting)   . Pure hypercholesterolemia   . Reflux   . Sacral fracture ([place])   . Sinus problem   . Symptomatic states associated with artificial menopause   . Thyroid disease   . Type II or unspecified type diabetes mellitus without mention of complication, not stated as uncontrolled   . Unspecified sleep apnea     Past Surgical History:  Procedure Laterality Date  . abcess removal  1997   MRSA  . ABDOMINAL HYSTERECTOMY  1997   complete in 1997, partial was in 1995  . carpal tunnel release r  07/03/13    [facility]al[facility]orf  . [place]  . CHOLECYSTECTOMY  03/1989  . COLONOSCOPY  03/21/2011   Normal.  [facility].  [name] KNEE SURGERY  2006  . LAPAROSCOPIC GASTRIC BANDING  05/2009  . TONSILLECTOMY AND ADENOIDECTOMY  1974  . TUBAL LIGATION    . ULNAR TUNNEL RELEASE Right 02/05/2018   Procedure: CUBITAL TUNNEL RELEASE/DECOMPRESSION;  Surgeon: Melrose Nakayama, MD;  Location: [facility];  Service: Orthopedics;  Laterality: Right;    There were no vitals filed for this visit.  Subjective Assessment - 09/24/18 1111    Subjective  Patient had confirmation that she does have of ALS.    Patient is accompained by:  Family member    Pertinent History   Patient fx her sacrum 9/18 and  she lost sensation to right foot, and she lost ability to DF her right foot. Patient had back surgery 03/2017 , she was sent home. She was walking with no device indoors and she could get out of the house and do the steps. June 18, 2017  her LLE was getting weaker and the right leg was getting stronger. Patient had follow up with neurosurgery. She was scheduled for a steriod shot Jul 23, 2017, but  on May 3 her legs didnt  feel right, she fell 4 times, she fractured her R fibula  on may 3rd, she went to hospital and spent one week. She went to SNF for 1 month. She had another neuro consult, resulting in kDx of  polyneuropathy. Then she had an apt with and [facility] said it was not polyneuropathy, (in [place])  She has a second consultation scheduled Dec 30 for a  Neuromuscular.consult.  She had a scan of the spine T 12-28-17  who then sent her to a thoracic surgeon at the end of November and was recommended to not due surgery. She does not know what her Dx is and she has been having home PT beginning August 25, 2017 . She has had HHPT 2 x  week for the last 5 months. She is able to walk 50 feet with RW. She is able to transfer from wc to stand independently.     How long can you stand comfortably?  5 mins    Patient Stated Goals  to walk  without the RW, or walk better, use the bathroom    Pain Onset  More than a month ago               Treatment: Nu-step x 5 mins L 4   Marching BLE , 2 #  hooklying x 20   SAQ with 4 lbs RLE, 2 lbs LLE x 20 with 5 sec hold  Hooklying marching with 4 lbs RLE, 2 lbs LLE x 15 x 2 sets and assist with LLE  Bridges x 10 x 2 sets  Hamstring stretching x 30 sec x 3 sets BLE  BLE DF stretch x 30 sec x 3 sets BLE  Added stretching to HEP: DF, hamstring bilaterally   Pt educated throughout session about proper posture and technique with exercises. Improved exercise technique, movement at target joints, use of target muscles after min to mod verbal, visual, tactile cues.                       PT Education - 09/24/18 1112    Education provided  Yes    Education Details  HEP    Person(s) Educated  Patient    Methods  Explanation    Comprehension  Verbalized understanding;Tactile cues required       PT Short Term Goals - 09/10/18 1058      PT SHORT TERM GOAL #1   Title  Patient will be independent in home exercise program to improve strength/mobility for better functional independence with ADLs.    Time  6    Period  Weeks    Status  Partially Met    Target Date  09/10/18        PT Long Term Goals - 09/10/18 1059      PT LONG TERM GOAL #1   Title  Patient will increase 10 meter walk test to >.50 m/s as to improve gait speed for better community ambulation and to reduce fall risk.    Baseline  . 36 m/sec    Time  8    Period  Weeks    Status  Partially Met    Target Date  11/05/18      PT LONG TERM GOAL #2   Title  Patient will transfer sit to stand  from 19 inch mat without assist or anyone supporting AD  to demonstrate improved LE strength to decrease falls risk.  Time  12    Period  Weeks    Status  Achieved    Target Date  09/10/18      PT LONG TERM GOAL #3   Title  Patient will demonstrate stance without AD  x 1 min to  demonstrate decreased falls risk.     Time  12    Period  Weeks    Status  Achieved    Target Date  09/10/18      PT LONG TERM GOAL #4   Title  Patient will  ambulate 500 feet to work towards community ambulation distances.     Baseline  270 feet    Time  12    Period  Weeks    Status  New    Target Date  10/30/18      PT LONG TERM GOAL #5   Title  Patient (< 24 years old) will complete five times sit to stand test in < 10 seconds indicating an increased LE strength and improved balance.    Baseline  20.28 m/sec    Time  12    Period  Weeks    Status  New    Target Date  12/03/18            Plan - 09/24/18 1117    Clinical Impression Statement  Pt requires direction and verbal cues for correct performance of gait and strengthening and stretching exercises. Patient has fatigue with endurance and decreased flexibility in hamstring and DF B ankles.  Patient struggles with to perform sit to stand from unstable surfaces due to weakness, as well as balance with mobility.  Pt encouraged continuing HEP   Patient will benefit from continued skilled PT to improve mobility and safety.    Rehab Potential  Good    Clinical Impairments Affecting Rehab Potential  Multiple tests concerning for neurologic involvement     PT Frequency  2x / week    PT Duration  12 weeks    PT Treatment/Interventions  Gait training;Therapeutic activities;Therapeutic exercise;Balance training;Neuromuscular re-education;Patient/family education;Orthotic Fit/Training;Manual techniques    PT Next Visit Plan  get updated feedback on pain after leg press    PT Home Exercise Plan  No updates this date     Consulted and Agree with Plan of Care  Patient       Patient will benefit from skilled therapeutic intervention in order to improve the following deficits and impairments:  Decreased balance, Decreased endurance, Decreased mobility, Difficulty walking, Impaired sensation, Decreased range of motion, Decreased  activity tolerance, Decreased coordination, Decreased strength, Pain, Postural dysfunction  Visit Diagnosis: 1. Difficulty in walking, not elsewhere classified   2. Muscle weakness (generalized)   3. Weakness of right leg   4. Pain in right ankle and joints of right foot   5. Pain in right hip        Problem List Patient Active Problem List   Diagnosis Date Noted  . Migraine without status migrainosus, not intractable   . Chronic pain syndrome   . Lumbar disc disease with radiculopathy 07/21/2017  . Lumbar foraminal stenosis 07/21/2017  . Unable to walk 07/21/2017  . Multiple falls 07/21/2017  . Displaced fracture of proximal end of right fibula 07/21/2017  . Lower extremity weakness 05/28/2017  . Right lower quadrant abdominal pain 08/22/2016  . Abnormal TSH 01/10/2016  . Morbid obesity ([place]) 01/12/2014  . Routine general medical examination at a health care facility 10/28/2012  . GERD (gastroesophageal reflux disease) 10/28/2012  .  Anxiety 12/20/2011  . Allergic rhinitis 12/20/2011  . Asthma 12/20/2011  . Lapband APL with HH repair 03/31/2011  . Uncontrolled type 2 diabetes mellitus with hyperglycemia, with long-term current use of insulin ([place]) 06/30/2007  . Hyperlipidemia 06/30/2007  . Essential hypertension 06/27/2007    Alanson Puls, PT DPT 09/24/2018, 11:50 AM  [place] MAIN [facility] SERVICES [address] [place], [place], [address] Phone: [phone]   Fax:  [phone]  Name: [name] MRN: [id number] Date of Birth: 09/02/1965

## 2018-09-26 ENCOUNTER — Ambulatory Visit: Payer: BC Managed Care – PPO | Admitting: Physical Therapy

## 2018-09-26 ENCOUNTER — Encounter: Payer: Self-pay | Admitting: Physical Therapy

## 2018-09-26 ENCOUNTER — Other Ambulatory Visit: Payer: Self-pay

## 2018-09-26 ENCOUNTER — Other Ambulatory Visit: Payer: Self-pay | Admitting: Primary Care

## 2018-09-26 DIAGNOSIS — R29898 Other symptoms and signs involving the musculoskeletal system: Secondary | ICD-10-CM

## 2018-09-26 DIAGNOSIS — R278 Other lack of coordination: Secondary | ICD-10-CM

## 2018-09-26 DIAGNOSIS — R262 Difficulty in walking, not elsewhere classified: Secondary | ICD-10-CM

## 2018-09-26 DIAGNOSIS — M25571 Pain in right ankle and joints of right foot: Secondary | ICD-10-CM

## 2018-09-26 DIAGNOSIS — M6281 Muscle weakness (generalized): Secondary | ICD-10-CM

## 2018-09-26 DIAGNOSIS — E119 Type 2 diabetes mellitus without complications: Secondary | ICD-10-CM

## 2018-09-26 NOTE — Therapy (Signed)
[place] MAIN [facility] SERVICES [address] [place], [place], [address] Phone: [phone]   Fax:  [phone]  Physical Therapy Treatment  Patient Details  Name: [name] MRN: [id number] Date of Birth: 24-Oct-1965 Referring Provider (PT): Cornell Barman   Encounter Date: 09/26/2018  PT End of Session - 09/26/18 1159    Visit Number  14    Number of Visits  25    Date for PT Re-Evaluation  10/29/18    PT Start Time  3716    PT Stop Time  1230    PT Time Calculation (min)  45 min    Equipment Utilized During Treatment  Gait belt;Other (comment)   AFO   Activity Tolerance  Patient tolerated treatment well;Patient limited by fatigue    Behavior During Therapy  [facility] for tasks assessed/performed       Past Medical History:  Diagnosis Date  . Allergy   . Amyotrophic lateral sclerosis (ALS) ([place]) 09/23/2018  . Anemia   . Anxiety   . Arthritis   . Asthma   . Colon polyp   . Complication of anesthesia   . Diabetes mellitus   . Edema   . Fatigue   . Hypertension   . IBS (irritable bowel syndrome)   . Lump in female breast   . Methicillin resistant Staphylococcus aureus in conditions classified elsewhere and of unspecified site   . Migraines   . Morbid obesity ([place])   . Night sweats   . Nonspecific abnormal results of thyroid function study   . Other B-complex deficiencies   . Paresthesias   . PONV (postoperative nausea and vomiting)   . Pure hypercholesterolemia   . Reflux   . Sacral fracture ([place])   . Sinus problem   . Symptomatic states associated with artificial menopause   . Thyroid disease   . Type II or unspecified type diabetes mellitus without mention of complication, not stated as uncontrolled   . Unspecified sleep apnea     Past Surgical History:  Procedure Laterality Date  . abcess removal  1997   MRSA  . ABDOMINAL HYSTERECTOMY  1997   complete in 1997, partial was in 1995  . carpal tunnel release r  07/03/13    Dalldorf  . [place]  . CHOLECYSTECTOMY  03/1989  . COLONOSCOPY  03/21/2011   Normal.  [facility]/Hayes.  [name] KNEE SURGERY  2006  . LAPAROSCOPIC GASTRIC BANDING  05/2009  . TONSILLECTOMY AND ADENOIDECTOMY  1974  . TUBAL LIGATION    . ULNAR TUNNEL RELEASE Right 02/05/2018   Procedure: CUBITAL TUNNEL RELEASE/DECOMPRESSION;  Surgeon: Melrose Nakayama, MD;  Location: [place];  Service: Orthopedics;  Laterality: Right;    There were no vitals filed for this visit.  Subjective Assessment - 09/26/18 1156    Subjective  Patient is reporting feeling ok but having knee pain.    Currently in Pain?  Yes    Pain Score  2     Pain Orientation  Right;Left    Pain Descriptors / Indicators  Aching          Treatment: Nu-step x 5 mins L 4   Marching BLE , 2 # LLE, 3 lbs RLE  hooklying x 20   SAQ with 4 lbs RLE, 2 lbs LLE x 20 with 5 sec hold  Hooklying marching with 4 lbs RLE, 2 lbs LLE x 15x 2 sets and assist with LLE  Bridges x 10  x 2 sets  Hamstring stretching with DF of B ankles x 30 sec x 3 sets BLE  BLE DF stretch x 30 sec x 3 sets BLE  Added stretching to HEP: DF, hamstring bilaterally  Pt educated throughout session about proper posture and technique with exercises. Improved exercise technique, movement at target joints, use of target muscles after min to mod verbal, visual, tactile cues                      PT Education - 09/26/18 1158    Education provided  Yes    Education Details  HEP    Person(s) Educated  Patient    Methods  Explanation    Comprehension  Verbalized understanding       PT Short Term Goals - 09/10/18 1058      PT SHORT TERM GOAL #1   Title  Patient will be independent in home exercise program to improve strength/mobility for better functional independence with ADLs.    Time  6    Period  Weeks    Status  Partially Met    Target Date  09/10/18        PT Long Term Goals - 09/10/18 1059      PT  LONG TERM GOAL #1   Title  Patient will increase 10 meter walk test to >.50 m/s as to improve gait speed for better community ambulation and to reduce fall risk.    Baseline  . 36 m/sec    Time  8    Period  Weeks    Status  Partially Met    Target Date  11/05/18      PT LONG TERM GOAL #2   Title  Patient will transfer sit to stand  from 19 inch mat without assist or anyone supporting AD  to demonstrate improved LE strength to decrease falls risk.     Time  12    Period  Weeks    Status  Achieved    Target Date  09/10/18      PT LONG TERM GOAL #3   Title  Patient will demonstrate stance without AD  x 1 min to demonstrate decreased falls risk.     Time  12    Period  Weeks    Status  Achieved    Target Date  09/10/18      PT LONG TERM GOAL #4   Title  Patient will  ambulate 500 feet to work towards community ambulation distances.     Baseline  270 feet    Time  12    Period  Weeks    Status  New    Target Date  10/30/18      PT LONG TERM GOAL #5   Title  Patient (< 26 years old) will complete five times sit to stand test in < 10 seconds indicating an increased LE strength and improved balance.    Baseline  20.28 m/sec    Time  12    Period  Weeks    Status  New    Target Date  12/03/18            Plan - 09/26/18 1159    Clinical Impression Statement  Pt requires direction and verbal cues for correct performance of gait and strengthening exercises. Patient has fatigue with endurance and difficulty with transfers and dynamic standing tasks.  Pt encouraged continuing HEP   Patient will benefit from continued skilled PT  to improve mobility and safety.    Rehab Potential  Good    Clinical Impairments Affecting Rehab Potential  Multiple tests concerning for neurologic involvement     PT Frequency  2x / week    PT Duration  12 weeks    PT Treatment/Interventions  Gait training;Therapeutic activities;Therapeutic exercise;Balance training;Neuromuscular  re-education;Patient/family education;Orthotic Fit/Training;Manual techniques    PT Next Visit Plan  get updated feedback on pain after leg press    PT Home Exercise Plan  No updates this date     Consulted and Agree with Plan of Care  Patient       Patient will benefit from skilled therapeutic intervention in order to improve the following deficits and impairments:  Decreased balance, Decreased endurance, Decreased mobility, Difficulty walking, Impaired sensation, Decreased range of motion, Decreased activity tolerance, Decreased coordination, Decreased strength, Pain, Postural dysfunction  Visit Diagnosis: 1. Muscle weakness (generalized)   2. Other lack of coordination   3. Difficulty in walking, not elsewhere classified   4. Weakness of right leg   5. Pain in right ankle and joints of right foot        Problem List Patient Active Problem List   Diagnosis Date Noted  . Migraine without status migrainosus, not intractable   . Chronic pain syndrome   . Lumbar disc disease with radiculopathy 07/21/2017  . Lumbar foraminal stenosis 07/21/2017  . Unable to walk 07/21/2017  . Multiple falls 07/21/2017  . Displaced fracture of proximal end of right fibula 07/21/2017  . Lower extremity weakness 05/28/2017  . Right lower quadrant abdominal pain 08/22/2016  . Abnormal TSH 01/10/2016  . Morbid obesity ([place]) 01/12/2014  . Routine general medical examination at a health care facility 10/28/2012  . GERD (gastroesophageal reflux disease) 10/28/2012  . Anxiety 12/20/2011  . Allergic rhinitis 12/20/2011  . Asthma 12/20/2011  . Lapband APL with HH repair 03/31/2011  . Uncontrolled type 2 diabetes mellitus with hyperglycemia, with long-term current use of insulin ([place]) 06/30/2007  . Hyperlipidemia 06/30/2007  . Essential hypertension 06/27/2007    Alanson Puls, PT DPT 09/26/2018, 12:32 PM  [place] MAIN [facility] SERVICES [address]  [place], [place], [address] Phone: [phone]   Fax:  [phone]  Name: [name] MRN: [id number] Date of Birth: 10/16/1965

## 2018-09-29 ENCOUNTER — Other Ambulatory Visit: Payer: Self-pay | Admitting: Primary Care

## 2018-09-29 DIAGNOSIS — J4520 Mild intermittent asthma, uncomplicated: Secondary | ICD-10-CM

## 2018-10-01 ENCOUNTER — Ambulatory Visit: Payer: BC Managed Care – PPO | Admitting: Physical Therapy

## 2018-10-01 ENCOUNTER — Encounter: Payer: Self-pay | Admitting: Physical Therapy

## 2018-10-01 ENCOUNTER — Other Ambulatory Visit: Payer: Self-pay

## 2018-10-01 DIAGNOSIS — M6281 Muscle weakness (generalized): Secondary | ICD-10-CM

## 2018-10-01 DIAGNOSIS — R262 Difficulty in walking, not elsewhere classified: Secondary | ICD-10-CM | POA: Diagnosis not present

## 2018-10-01 DIAGNOSIS — R278 Other lack of coordination: Secondary | ICD-10-CM

## 2018-10-01 DIAGNOSIS — R29898 Other symptoms and signs involving the musculoskeletal system: Secondary | ICD-10-CM

## 2018-10-03 ENCOUNTER — Encounter: Payer: Self-pay | Admitting: Physical Therapy

## 2018-10-03 ENCOUNTER — Other Ambulatory Visit: Payer: Self-pay

## 2018-10-03 ENCOUNTER — Ambulatory Visit: Payer: BC Managed Care – PPO | Admitting: Physical Therapy

## 2018-10-03 DIAGNOSIS — R29898 Other symptoms and signs involving the musculoskeletal system: Secondary | ICD-10-CM

## 2018-10-03 DIAGNOSIS — M25571 Pain in right ankle and joints of right foot: Secondary | ICD-10-CM

## 2018-10-03 DIAGNOSIS — R262 Difficulty in walking, not elsewhere classified: Secondary | ICD-10-CM | POA: Diagnosis not present

## 2018-10-03 DIAGNOSIS — M6281 Muscle weakness (generalized): Secondary | ICD-10-CM

## 2018-10-03 DIAGNOSIS — R278 Other lack of coordination: Secondary | ICD-10-CM

## 2018-10-03 DIAGNOSIS — M25551 Pain in right hip: Secondary | ICD-10-CM

## 2018-10-03 NOTE — Therapy (Signed)
[place] MAIN [facility] SERVICES [address] [place], [place], [address] Phone: [phone]   Fax:  [phone]  Physical Therapy Treatment  Patient Details  Name: [name] MRN: [id number] Date of Birth: 1966-03-14 Referring Provider (PT): Cornell Barman   Encounter Date: 10/03/2018  PT End of Session - 10/03/18 1225    Visit Number  16    Number of Visits  25    Date for PT Re-Evaluation  10/29/18    PT Start Time  7858    PT Stop Time  1145    PT Time Calculation (min)  40 min    Equipment Utilized During Treatment  Gait belt;Other (comment)   AFO   Activity Tolerance  Patient tolerated treatment well;Patient limited by fatigue    Behavior During Therapy  [facility] for tasks assessed/performed       Past Medical History:  Diagnosis Date  . Allergy   . Amyotrophic lateral sclerosis (ALS) ([place]) 09/23/2018  . Anemia   . Anxiety   . Arthritis   . Asthma   . Colon polyp   . Complication of anesthesia   . Diabetes mellitus   . Edema   . Fatigue   . Hypertension   . IBS (irritable bowel syndrome)   . Lump in female breast   . Methicillin resistant Staphylococcus aureus in conditions classified elsewhere and of unspecified site   . Migraines   . Morbid obesity ([place])   . Night sweats   . Nonspecific abnormal results of thyroid function study   . Other B-complex deficiencies   . Paresthesias   . PONV (postoperative nausea and vomiting)   . Pure hypercholesterolemia   . Reflux   . Sacral fracture ([place])   . Sinus problem   . Symptomatic states associated with artificial menopause   . Thyroid disease   . Type II or unspecified type diabetes mellitus without mention of complication, not stated as uncontrolled   . Unspecified sleep apnea     Past Surgical History:  Procedure Laterality Date  . abcess removal  1997   MRSA  . ABDOMINAL HYSTERECTOMY  1997   complete in 1997, partial was in 1995  . carpal tunnel release r   07/03/13   Dalldorf  . [place]  . CHOLECYSTECTOMY  03/1989  . COLONOSCOPY  03/21/2011   Normal.  [facility].  [name] KNEE SURGERY  2006  . LAPAROSCOPIC GASTRIC BANDING  05/2009  . TONSILLECTOMY AND ADENOIDECTOMY  1974  . TUBAL LIGATION    . ULNAR TUNNEL RELEASE Right 02/05/2018   Procedure: CUBITAL TUNNEL RELEASE/DECOMPRESSION;  Surgeon: Melrose Nakayama, MD;  Location: [place];  Service: Orthopedics;  Laterality: Right;    There were no vitals filed for this visit.  Subjective Assessment - 10/03/18 1223    Subjective  Patient is reporting feeling ok, no knee pain.    Patient is accompained by:  Family member    Pertinent History   Patient fx her sacrum 9/18 and  she lost sensation to right foot, and she lost ability to DF her right foot. Patient had back surgery 03/2017 , she was sent home. She was walking with no device indoors and she could get out of the house and do the steps. June 18, 2017  her LLE was getting weaker and the right leg was getting stronger. Patient had follow up with neurosurgery. She was scheduled for a steriod shot Jul 23, 2017, but on  May 3 her legs didnt  feel right, she fell 4 times, she fractured her R fibula  on may 3rd, she went to hospital and spent one week. She went to SNF for 1 month. She had another neuro consult, resulting in kDx of  polyneuropathy. Then she had an apt with and [facility] said it was not polyneuropathy, (in [place])  She has a second consultation scheduled Dec 30 for a  Neuromuscular.consult.  She had a scan of the spine T 12-28-17  who then sent her to a thoracic surgeon at the end of November and was recommended to not due surgery. She does not know what her Dx is and she has been having home PT beginning August 25, 2017 . She has had HHPT 2 x  week for the last 5 months. She is able to walk 50 feet with RW. She is able to transfer from wc to stand independently.     How long can you stand comfortably?  5 mins    Patient Stated Goals  to  walk without the RW, or walk better, use the bathroom    Pain Onset  More than a month ago            Treatment:  Nu-step x 5 mins L 4  Marching BLE , 2 # LLE, 3 lbs RLE hooklying x 20   SAQ with 4 lbs RLE, 3 lbs LLE x 20 with 5 sec hold  Hooklying marching with 4 lbs RLE, 3 lbs LLE x 15x 2 sets and assist with LLE  Bridges x 10 x 2 sets  Hamstring stretching with DF of B ankles x 30 sec x 3 sets BLE  BLE DF stretch x 30 sec x 3 sets BLE  Added stretching to HEP: DF, hamstring bilaterally  Pt educated throughout session about proper posture and technique with exercises. Improved exercise technique, movement at target joints, use of target muscles after min to mod verbal, visual, tactile cues                      PT Education - 10/03/18 1224    Education provided  Yes    Education Details  HEP    Person(s) Educated  Patient    Methods  Explanation    Comprehension  Verbalized understanding;Returned demonstration       PT Short Term Goals - 09/10/18 1058      PT SHORT TERM GOAL #1   Title  Patient will be independent in home exercise program to improve strength/mobility for better functional independence with ADLs.    Time  6    Period  Weeks    Status  Partially Met    Target Date  09/10/18        PT Long Term Goals - 09/10/18 1059      PT LONG TERM GOAL #1   Title  Patient will increase 10 meter walk test to >.50 m/s as to improve gait speed for better community ambulation and to reduce fall risk.    Baseline  . 36 m/sec    Time  8    Period  Weeks    Status  Partially Met    Target Date  11/05/18      PT LONG TERM GOAL #2   Title  Patient will transfer sit to stand  from 19 inch mat without assist or anyone supporting AD  to demonstrate improved LE strength to decrease falls risk.  Time  12    Period  Weeks    Status  Achieved    Target Date  09/10/18      PT LONG TERM GOAL #3   Title  Patient will demonstrate  stance without AD  x 1 min to demonstrate decreased falls risk.     Time  12    Period  Weeks    Status  Achieved    Target Date  09/10/18      PT LONG TERM GOAL #4   Title  Patient will  ambulate 500 feet to work towards community ambulation distances.     Baseline  270 feet    Time  12    Period  Weeks    Status  New    Target Date  10/30/18      PT LONG TERM GOAL #5   Title  Patient (< 75 years old) will complete five times sit to stand test in < 10 seconds indicating an increased LE strength and improved balance.    Baseline  20.28 m/sec    Time  12    Period  Weeks    Status  New    Target Date  12/03/18            Plan - 10/03/18 1225    Clinical Impression Statement  Pt requires direction and verbal cues for correct performance of exercise and activities. Patient demonstrates difficulties strengthening against gravity and open and closed chain activities and has no reports of pain increases. Pt was able to perform all exercises with min assist and VC for technique.   Patient struggles with fatigue and repetitions.  Pt encouraged continuing HEP. Follow-up as scheduled.    Rehab Potential  Good    Clinical Impairments Affecting Rehab Potential  Multiple tests concerning for neurologic involvement     PT Frequency  2x / week    PT Duration  12 weeks    PT Treatment/Interventions  Gait training;Therapeutic activities;Therapeutic exercise;Balance training;Neuromuscular re-education;Patient/family education;Orthotic Fit/Training;Manual techniques    PT Next Visit Plan  get updated feedback on pain after leg press    PT Home Exercise Plan  No updates this date     Consulted and Agree with Plan of Care  Patient       Patient will benefit from skilled therapeutic intervention in order to improve the following deficits and impairments:  Decreased balance, Decreased endurance, Decreased mobility, Difficulty walking, Impaired sensation, Decreased range of motion, Decreased  activity tolerance, Decreased coordination, Decreased strength, Pain, Postural dysfunction  Visit Diagnosis: 1. Other lack of coordination   2. Muscle weakness (generalized)   3. Difficulty in walking, not elsewhere classified   4. Pain in right hip   5. Pain in right ankle and joints of right foot   6. Weakness of right leg        Problem List Patient Active Problem List   Diagnosis Date Noted  . Migraine without status migrainosus, not intractable   . Chronic pain syndrome   . Lumbar disc disease with radiculopathy 07/21/2017  . Lumbar foraminal stenosis 07/21/2017  . Unable to walk 07/21/2017  . Multiple falls 07/21/2017  . Displaced fracture of proximal end of right fibula 07/21/2017  . Lower extremity weakness 05/28/2017  . Right lower quadrant abdominal pain 08/22/2016  . Abnormal TSH 01/10/2016  . Morbid obesity ([place]) 01/12/2014  . Routine general medical examination at a health care facility 10/28/2012  . GERD (gastroesophageal reflux disease) 10/28/2012  .  Anxiety 12/20/2011  . Allergic rhinitis 12/20/2011  . Asthma 12/20/2011  . Lapband APL with HH repair 03/31/2011  . Uncontrolled type 2 diabetes mellitus with hyperglycemia, with long-term current use of insulin ([place]) 06/30/2007  . Hyperlipidemia 06/30/2007  . Essential hypertension 06/27/2007    Alanson Puls, PT DPT 10/03/2018, 1:08 PM  [place] MAIN [facility] SERVICES [address] [place], [place], [address] Phone: [phone]   Fax:  [phone]  Name: [name] MRN: [id number] Date of Birth: 03-Jan-1966

## 2018-10-03 NOTE — Therapy (Signed)
[place] MAIN [facility] SERVICES [address] [place], [place], [address] Phone: [phone]   Fax:  [phone]  Physical Therapy Treatment  Patient Details  Name: [name] MRN: [id number] Date of Birth: Jan 19, 1966 Referring Provider (PT): Cornell Barman   Encounter Date: 10/01/2018    Past Medical History:  Diagnosis Date  . Allergy   . Amyotrophic lateral sclerosis (ALS) ([place]) 09/23/2018  . Anemia   . Anxiety   . Arthritis   . Asthma   . Colon polyp   . Complication of anesthesia   . Diabetes mellitus   . Edema   . Fatigue   . Hypertension   . IBS (irritable bowel syndrome)   . Lump in female breast   . Methicillin resistant Staphylococcus aureus in conditions classified elsewhere and of unspecified site   . Migraines   . Morbid obesity ([place])   . Night sweats   . Nonspecific abnormal results of thyroid function study   . Other B-complex deficiencies   . Paresthesias   . PONV (postoperative nausea and vomiting)   . Pure hypercholesterolemia   . Reflux   . Sacral fracture ([place])   . Sinus problem   . Symptomatic states associated with artificial menopause   . Thyroid disease   . Type II or unspecified type diabetes mellitus without mention of complication, not stated as uncontrolled   . Unspecified sleep apnea     Past Surgical History:  Procedure Laterality Date  . abcess removal  1997   MRSA  . ABDOMINAL HYSTERECTOMY  1997   complete in 1997, partial was in 1995  . carpal tunnel release r  07/03/13   Dalldorf  . [place]  . CHOLECYSTECTOMY  03/1989  . COLONOSCOPY  03/21/2011   Normal.  [facility].  [name] KNEE SURGERY  2006  . LAPAROSCOPIC GASTRIC BANDING  05/2009  . TONSILLECTOMY AND ADENOIDECTOMY  1974  . TUBAL LIGATION    . ULNAR TUNNEL RELEASE Right 02/05/2018   Procedure: CUBITAL TUNNEL RELEASE/DECOMPRESSION;  Surgeon: Melrose Nakayama, MD;  Location: [place];  Service: Orthopedics;   Laterality: Right;    There were no vitals filed for this visit.    Treatment: Nu-step x 5 mins L 4   Marching BLE , 2 #  hooklying x 20   SAQ with 4 lbs RLE, 2 lbs LLE x 20 with 5 sec hold  Hooklying marching with 4 lbs RLE, 2 lbs LLE x 15x 2 sets and assist with LLE  Bridges x 10 x 2 sets  Hamstring stretching x 30 sec x 3 sets BLE  BLE DF stretch x 30 sec x 3 sets BLE  Added stretching to HEP: DF, hamstring bilaterally  Pt educated throughout session about proper posture and technique with exercises. Improved exercise technique, movement at target joints, use of target muscles after min to mod verbal, visual, tactile cues.                             PT Short Term Goals - 09/10/18 1058      PT SHORT TERM GOAL #1   Title  Patient will be independent in home exercise program to improve strength/mobility for better functional independence with ADLs.    Time  6    Period  Weeks    Status  Partially Met    Target Date  09/10/18  PT Long Term Goals - 09/10/18 1059      PT LONG TERM GOAL #1   Title  Patient will increase 10 meter walk test to >.50 m/s as to improve gait speed for better community ambulation and to reduce fall risk.    Baseline  . 36 m/sec    Time  8    Period  Weeks    Status  Partially Met    Target Date  11/05/18      PT LONG TERM GOAL #2   Title  Patient will transfer sit to stand  from 19 inch mat without assist or anyone supporting AD  to demonstrate improved LE strength to decrease falls risk.     Time  12    Period  Weeks    Status  Achieved    Target Date  09/10/18      PT LONG TERM GOAL #3   Title  Patient will demonstrate stance without AD  x 1 min to demonstrate decreased falls risk.     Time  12    Period  Weeks    Status  Achieved    Target Date  09/10/18      PT LONG TERM GOAL #4   Title  Patient will  ambulate 500 feet to work towards community ambulation distances.     Baseline  270  feet    Time  12    Period  Weeks    Status  New    Target Date  10/30/18      PT LONG TERM GOAL #5   Title  Patient (< 14 years old) will complete five times sit to stand test in < 10 seconds indicating an increased LE strength and improved balance.    Baseline  20.28 m/sec    Time  12    Period  Weeks    Status  New    Target Date  12/03/18              Patient will benefit from skilled therapeutic intervention in order to improve the following deficits and impairments:  Decreased balance, Decreased endurance, Decreased mobility, Difficulty walking, Impaired sensation, Decreased range of motion, Decreased activity tolerance, Decreased coordination, Decreased strength, Pain, Postural dysfunction  Visit Diagnosis: 1. Muscle weakness (generalized)   2. Other lack of coordination   3. Difficulty in walking, not elsewhere classified   4. Weakness of right leg        Problem List Patient Active Problem List   Diagnosis Date Noted  . Migraine without status migrainosus, not intractable   . Chronic pain syndrome   . Lumbar disc disease with radiculopathy 07/21/2017  . Lumbar foraminal stenosis 07/21/2017  . Unable to walk 07/21/2017  . Multiple falls 07/21/2017  . Displaced fracture of proximal end of right fibula 07/21/2017  . Lower extremity weakness 05/28/2017  . Right lower quadrant abdominal pain 08/22/2016  . Abnormal TSH 01/10/2016  . Morbid obesity ([place]) 01/12/2014  . Routine general medical examination at a health care facility 10/28/2012  . GERD (gastroesophageal reflux disease) 10/28/2012  . Anxiety 12/20/2011  . Allergic rhinitis 12/20/2011  . Asthma 12/20/2011  . Lapband APL with HH repair 03/31/2011  . Uncontrolled type 2 diabetes mellitus with hyperglycemia, with long-term current use of insulin ([place]) 06/30/2007  . Hyperlipidemia 06/30/2007  . Essential hypertension 06/27/2007    Alanson Puls, PT DPT 10/03/2018, 6:40 PM  [place] MAIN [facility] SERVICES [address]  [place], [place], [address] Phone: [phone]   Fax:  [phone]  Name: [name] MRN: [id number] Date of Birth: 02-26-1966

## 2018-10-08 ENCOUNTER — Other Ambulatory Visit: Payer: Self-pay

## 2018-10-08 ENCOUNTER — Encounter: Payer: Self-pay | Admitting: Physical Therapy

## 2018-10-08 ENCOUNTER — Ambulatory Visit: Payer: BC Managed Care – PPO | Admitting: Physical Therapy

## 2018-10-08 DIAGNOSIS — M25571 Pain in right ankle and joints of right foot: Secondary | ICD-10-CM

## 2018-10-08 DIAGNOSIS — R29898 Other symptoms and signs involving the musculoskeletal system: Secondary | ICD-10-CM

## 2018-10-08 DIAGNOSIS — M25551 Pain in right hip: Secondary | ICD-10-CM

## 2018-10-08 DIAGNOSIS — R278 Other lack of coordination: Secondary | ICD-10-CM

## 2018-10-08 DIAGNOSIS — R262 Difficulty in walking, not elsewhere classified: Secondary | ICD-10-CM

## 2018-10-08 DIAGNOSIS — M6281 Muscle weakness (generalized): Secondary | ICD-10-CM

## 2018-10-08 NOTE — Therapy (Signed)
[place] MAIN [facility] SERVICES [address] [place], [place], [address] Phone: [phone]   Fax:  [phone]  Physical Therapy Treatment  Patient Details  Name: [name] MRN: [id number] Date of Birth: 11/20/1965 Referring Provider (PT): Cornell Barman   Encounter Date: 10/08/2018  PT End of Session - 10/08/18 1158    Visit Number  17    Number of Visits  25    Date for PT Re-Evaluation  10/29/18    PT Start Time  1140    PT Stop Time  1220    PT Time Calculation (min)  40 min    Equipment Utilized During Treatment  Gait belt;Other (comment)   AFO   Activity Tolerance  Patient tolerated treatment well;Patient limited by fatigue    Behavior During Therapy  [facility] for tasks assessed/performed       Past Medical History:  Diagnosis Date  . Allergy   . Amyotrophic lateral sclerosis (ALS) ([place]) 09/23/2018  . Anemia   . Anxiety   . Arthritis   . Asthma   . Colon polyp   . Complication of anesthesia   . Diabetes mellitus   . Edema   . Fatigue   . Hypertension   . IBS (irritable bowel syndrome)   . Lump in female breast   . Methicillin resistant Staphylococcus aureus in conditions classified elsewhere and of unspecified site   . Migraines   . Morbid obesity ([place])   . Night sweats   . Nonspecific abnormal results of thyroid function study   . Other B-complex deficiencies   . Paresthesias   . PONV (postoperative nausea and vomiting)   . Pure hypercholesterolemia   . Reflux   . Sacral fracture ([place])   . Sinus problem   . Symptomatic states associated with artificial menopause   . Thyroid disease   . Type II or unspecified type diabetes mellitus without mention of complication, not stated as uncontrolled   . Unspecified sleep apnea     Past Surgical History:  Procedure Laterality Date  . abcess removal  1997   MRSA  . ABDOMINAL HYSTERECTOMY  1997   complete in 1997, partial was in 1995  . carpal tunnel release r   07/03/13   Dal[place]orf  . [place]  . CHOLECYSTECTOMY  03/1989  . COLONOSCOPY  03/21/2011   Normal.  [facility].  [name] KNEE SURGERY  2006  . LAPAROSCOPIC GASTRIC BANDING  05/2009  . TONSILLECTOMY AND ADENOIDECTOMY  1974  . TUBAL LIGATION    . ULNAR TUNNEL RELEASE Right 02/05/2018   Procedure: CUBITAL TUNNEL RELEASE/DECOMPRESSION;  Surgeon: Melrose Nakayama, MD;  Location: [place];  Service: Orthopedics;  Laterality: Right;    There were no vitals filed for this visit.  Subjective Assessment - 10/08/18 1144    Subjective  Patient  had a fall last night and needed the EMS to help her up.    Patient is accompained by:  Family member    Pertinent History   Patient fx her sacrum 9/18 and  she lost sensation to right foot, and she lost ability to DF her right foot. Patient had back surgery 03/2017 , she was sent home. She was walking with no device indoors and she could get out of the house and do the steps. June 18, 2017  her LLE was getting weaker and the right leg was getting stronger. Patient had follow up with neurosurgery. She was scheduled for a  steriod shot Jul 23, 2017, but on May 3 her legs didnt  feel right, she fell 4 times, she fractured her R fibula  on may 3rd, she went to hospital and spent one week. She went to SNF for 1 month. She had another neuro consult, resulting in kDx of  polyneuropathy. Then she had an apt with and [facility] said it was not polyneuropathy, (in [place])  She has a second consultation scheduled Dec 30 for a  Neuromuscular.consult.  She had a scan of the spine T 12-28-17  who then sent her to a thoracic surgeon at the end of November and was recommended to not due surgery. She does not know what her Dx is and she has been having home PT beginning August 25, 2017 . She has had HHPT 2 x  week for the last 5 months. She is able to walk 50 feet with RW. She is able to transfer from wc to stand independently.     How long can you stand comfortably?  5 mins    Patient  Stated Goals  to walk without the RW, or walk better, use the bathroom    Currently in Pain?  Yes    Pain Score  2     Pain Location  Knee    Pain Orientation  Right;Left    Pain Descriptors / Indicators  Aching    Pain Onset  More than a month ago           Treatment:  Nu-step x 5 mins L 4  Marching BLE , 2 #LLE, 3 lbs RLEhooklying x 20   SAQ with 4 lbs RLE, 3 lbs LLE x 20 with 5 sec hold  Hooklying marching with 4 lbs RLE, 3 lbs LLE x 15x 2 sets and assist with LLE  Bridges x 10 x 2 sets  Hamstring stretchingwith DF of B anklesx 30 sec x 3 sets BLE  BLE DF stretch x 30 sec x 3 sets BLE  Added stretching to HEP: DF, hamstring bilaterally  Pt educated throughout session about proper posture and technique with exercises. Improved exercise technique, movement at target joints, use of target muscles after min to mod verbal, visual, tactile cues                     PT Education - 10/08/18 1145    Education provided  Yes    Education Details  HEP    Person(s) Educated  Patient    Methods  Explanation    Comprehension  Verbalized understanding;Returned demonstration;Need further instruction       PT Short Term Goals - 09/10/18 1058      PT SHORT TERM GOAL #1   Title  Patient will be independent in home exercise program to improve strength/mobility for better functional independence with ADLs.    Time  6    Period  Weeks    Status  Partially Met    Target Date  09/10/18        PT Long Term Goals - 09/10/18 1059      PT LONG TERM GOAL #1   Title  Patient will increase 10 meter walk test to >.50 m/s as to improve gait speed for better community ambulation and to reduce fall risk.    Baseline  . 36 m/sec    Time  8    Period  Weeks    Status  Partially Met    Target Date  11/05/18  PT LONG TERM GOAL #2   Title  Patient will transfer sit to stand  from 19 inch mat without assist or anyone supporting AD  to demonstrate  improved LE strength to decrease falls risk.     Time  12    Period  Weeks    Status  Achieved    Target Date  09/10/18      PT LONG TERM GOAL #3   Title  Patient will demonstrate stance without AD  x 1 min to demonstrate decreased falls risk.     Time  12    Period  Weeks    Status  Achieved    Target Date  09/10/18      PT LONG TERM GOAL #4   Title  Patient will  ambulate 500 feet to work towards community ambulation distances.     Baseline  270 feet    Time  12    Period  Weeks    Status  New    Target Date  10/30/18      PT LONG TERM GOAL #5   Title  Patient (< 80 years old) will complete five times sit to stand test in < 10 seconds indicating an increased LE strength and improved balance.    Baseline  20.28 m/sec    Time  12    Period  Weeks    Status  New    Target Date  12/03/18            Plan - 10/08/18 1159    Clinical Impression Statement  Patient was able to perform open chain strengthening exercises with increased weights and better control of exercises. She has continues to have dizziness after supine to sit mobiity, that takes several minutes to decrease.She will continue to benefit from skilled PT to improve mobility and strength.    Rehab Potential  Good    Clinical Impairments Affecting Rehab Potential  Multiple tests concerning for neurologic involvement     PT Frequency  2x / week    PT Duration  12 weeks    PT Treatment/Interventions  Gait training;Therapeutic activities;Therapeutic exercise;Balance training;Neuromuscular re-education;Patient/family education;Orthotic Fit/Training;Manual techniques    PT Next Visit Plan  get updated feedback on pain after leg press    PT Home Exercise Plan  No updates this date     Consulted and Agree with Plan of Care  Patient       Patient will benefit from skilled therapeutic intervention in order to improve the following deficits and impairments:  Decreased balance, Decreased endurance, Decreased mobility,  Difficulty walking, Impaired sensation, Decreased range of motion, Decreased activity tolerance, Decreased coordination, Decreased strength, Pain, Postural dysfunction  Visit Diagnosis: 1. Other lack of coordination   2. Muscle weakness (generalized)   3. Difficulty in walking, not elsewhere classified   4. Pain in right hip   5. Pain in right ankle and joints of right foot   6. Weakness of right leg        Problem List Patient Active Problem List   Diagnosis Date Noted  . Migraine without status migrainosus, not intractable   . Chronic pain syndrome   . Lumbar disc disease with radiculopathy 07/21/2017  . Lumbar foraminal stenosis 07/21/2017  . Unable to walk 07/21/2017  . Multiple falls 07/21/2017  . Displaced fracture of proximal end of right fibula 07/21/2017  . Lower extremity weakness 05/28/2017  . Right lower quadrant abdominal pain 08/22/2016  . Abnormal TSH 01/10/2016  . Morbid  obesity ([place]) 01/12/2014  . Routine general medical examination at a health care facility 10/28/2012  . GERD (gastroesophageal reflux disease) 10/28/2012  . Anxiety 12/20/2011  . Allergic rhinitis 12/20/2011  . Asthma 12/20/2011  . Lapband APL with HH repair 03/31/2011  . Uncontrolled type 2 diabetes mellitus with hyperglycemia, with long-term current use of insulin ([place]) 06/30/2007  . Hyperlipidemia 06/30/2007  . Essential hypertension 06/27/2007    Alanson Puls, [place] DPT 10/08/2018, 12:10 PM  [place] MAIN [facility] SERVICES [address] [place], [place], [address] Phone: [phone]   Fax:  [phone]  Name: [name] MRN: [id number] Date of Birth: May 12, 1965

## 2018-10-10 ENCOUNTER — Ambulatory Visit: Payer: BC Managed Care – PPO | Admitting: Occupational Therapy

## 2018-10-10 ENCOUNTER — Encounter: Payer: Self-pay | Admitting: Occupational Therapy

## 2018-10-10 ENCOUNTER — Other Ambulatory Visit: Payer: Self-pay

## 2018-10-10 ENCOUNTER — Ambulatory Visit: Payer: BC Managed Care – PPO

## 2018-10-10 ENCOUNTER — Other Ambulatory Visit: Payer: Self-pay | Admitting: Primary Care

## 2018-10-10 DIAGNOSIS — R262 Difficulty in walking, not elsewhere classified: Secondary | ICD-10-CM | POA: Diagnosis not present

## 2018-10-10 DIAGNOSIS — R278 Other lack of coordination: Secondary | ICD-10-CM

## 2018-10-10 DIAGNOSIS — M6281 Muscle weakness (generalized): Secondary | ICD-10-CM

## 2018-10-10 DIAGNOSIS — F419 Anxiety disorder, unspecified: Secondary | ICD-10-CM

## 2018-10-10 NOTE — Therapy (Signed)
[place] MAIN [facility] SERVICES [place], [place], [address] Phone: [phone]   Fax:  [phone]  Occupational Therapy Treatment  Patient Details  Name: [name] MRN: [id number] Date of Birth: 05-08-1965 Referring Provider (OT): Alma Friendly, MD   Encounter Date: 10/10/2018    Past Medical History:  Diagnosis Date  . Allergy   . Amyotrophic lateral sclerosis (ALS) ([place]) 09/23/2018  . Anemia   . Anxiety   . Arthritis   . Asthma   . Colon polyp   . Complication of anesthesia   . Diabetes mellitus   . Edema   . Fatigue   . Hypertension   . IBS (irritable bowel syndrome)   . Lump in female breast   . Methicillin resistant Staphylococcus aureus in conditions classified elsewhere and of unspecified site   . Migraines   . Morbid obesity ([place])   . Night sweats   . Nonspecific abnormal results of thyroid function study   . Other B-complex deficiencies   . Paresthesias   . PONV (postoperative nausea and vomiting)   . Pure hypercholesterolemia   . Reflux   . Sacral fracture ([place])   . Sinus problem   . Symptomatic states associated with artificial menopause   . Thyroid disease   . Type II or unspecified type diabetes mellitus without mention of complication, not stated as uncontrolled   . Unspecified sleep apnea     Past Surgical History:  Procedure Laterality Date  . abcess removal  1997   MRSA  . ABDOMINAL HYSTERECTOMY  1997   complete in 1997, partial was in 1995  . carpal tunnel release r  07/03/13   [place]orf  . [place]  . CHOLECYSTECTOMY  03/1989  . COLONOSCOPY  03/21/2011   Normal.  [facility].  [name] KNEE SURGERY  2006  . LAPAROSCOPIC GASTRIC BANDING  05/2009  . TONSILLECTOMY AND ADENOIDECTOMY  1974  . TUBAL LIGATION    . ULNAR TUNNEL RELEASE Right 02/05/2018   Procedure: CUBITAL TUNNEL RELEASE/DECOMPRESSION;  Surgeon: Melrose Nakayama, MD;  Location: [place];  Service:  Orthopedics;  Laterality: Right;    There were no vitals filed for this visit.  Subjective Assessment - 10/10/18 1144    Subjective   Pt. arrived early for PT, and due to a cancellation pt. was able to be seen for an OT session.    Patient is accompanied by:  Family member    Pertinent History  [name] was recently diagnosed with ALS yesterday at [place] [facility].  She has had a recent decrease in strength in L side of body, UE and LE with decreased coordination.  She feels like she is dropping cups more and needs to use both hands for hand to mouth and handwriting has decreased to almost illegible.    Patient Stated Goals  I want to work on handwriting, being able to hold a cup again and regain strength and coordination in both hands.    Currently in Pain?  Yes    Pain Score  2     Pain Orientation  Right;Left    Pain Descriptors / Indicators  Aching    Pain Type  Chronic pain    Pain Onset  More than a month ago       OT TREATMENT    Therapeutic Exercise:   Pt. Worked on green thearputty ex. For hand strengthening. Exercises included: gross gripping, gross digit extension, thumb abduction,  lateral, and 3pt. Pinch strengthening, digit abduction, and thumb opposition with yellow theraband, Pt. Required cues for visual demonstration. Pt. Was provided with a visual handout HEP through [place].                              OT Long Term Goals - 09/24/18 1133      OT LONG TERM GOAL #1   Title  Pt will complete self feeding using adaptive utensils and compenstory techniques using R hand.    Baseline  Pt needs help with holding cup, cutting meat and uses L hand as assist.    Time  12    Period  Weeks    Status  New    Target Date  12/17/18      OT LONG TERM GOAL #2   Title  Pt will complete LB dressing using adaptive equipment with min assist and cues in unsupported sitting.    Baseline  Pt is dependent on her husband for socks, shoes, AFOs and pants over  hips 50% of time.    Time  12    Period  Weeks    Status  New    Target Date  12/17/18      OT LONG TERM GOAL #3   Title  Pt will complete toileting and hygiene with adaptive aid with min assist and cues on elevated toilet with use of grab bar and FWW for balance.    Baseline  Pt unable to complete hygiene after BM; able to complete Independently after urinating.    Time  12    Period  Weeks    Status  New    Target Date  12/17/18      OT LONG TERM GOAL #4   Title  Pt will be educated in HEP for strengtening and fine motor coordination.    Baseline  Pt does not have any HEP for BUEs or hands.    Time  12    Period  Weeks    Status  New    Target Date  12/17/18      OT LONG TERM GOAL #5   Title  Pt will increase strength  in B hands for use during ADLs by 4#.    Time  12    Period  Weeks    Status  New    Target Date  12/17/18      Long Term Additional Goals   Additional Long Term Goals  Yes      OT LONG TERM GOAL #6   Title  Pt will increase strength 2# in B pinch for lateral and 3 point for use during ADLs.    Baseline  R 4# L 3#    Time  12    Period  Weeks    Status  New    Target Date  12/17/18              Patient will benefit from skilled therapeutic intervention in order to improve the following deficits and impairments:           Visit Diagnosis: No diagnosis found.    Problem List Patient Active Problem List   Diagnosis Date Noted  . Migraine without status migrainosus, not intractable   . Chronic pain syndrome   . Lumbar disc disease with radiculopathy 07/21/2017  . Lumbar foraminal stenosis 07/21/2017  . Unable to walk 07/21/2017  . Multiple falls 07/21/2017  . Displaced  fracture of proximal end of right fibula 07/21/2017  . Lower extremity weakness 05/28/2017  . Right lower quadrant abdominal pain 08/22/2016  . Abnormal TSH 01/10/2016  . Morbid obesity ([place]) 01/12/2014  . Routine general medical examination at a health care  facility 10/28/2012  . GERD (gastroesophageal reflux disease) 10/28/2012  . Anxiety 12/20/2011  . Allergic rhinitis 12/20/2011  . Asthma 12/20/2011  . Lapband APL with HH repair 03/31/2011  . Uncontrolled type 2 diabetes mellitus with hyperglycemia, with long-term current use of insulin ([place]) 06/30/2007  . Hyperlipidemia 06/30/2007  . Essential hypertension 06/27/2007    Harrel Carina, MS, OTR/L 10/10/2018, 2:37 PM  [place] MAIN [facility] SERVICES [address] [place], [place], [address] Phone: [phone]   Fax:  [phone]  Name: AALEIGHA BOZZA MRN: [id number] Date of Birth: 1965-09-17

## 2018-10-10 NOTE — Therapy (Signed)
[place] MAIN [facility] SERVICES [address] [place], [place], [address] Phone: [phone]   Fax:  [phone]  Physical Therapy Treatment  Patient Details  Name: [name] MRN: [id number] Date of Birth: 02/14/1971 Referring Provider (PT): Cornell Barman   Encounter Date: 10/10/2023  PT End of Session - 10/10/18 1235    Visit Number  18    Number of Visits  25    Date for PT Re-Evaluation  10/29/18    PT Start Time  1104    PT Stop Time  1154    PT Time Calculation (min)  50 min    Equipment Utilized During Treatment  Gait belt;Other (comment)   AFO   Activity Tolerance  Patient tolerated treatment well;Patient limited by fatigue    Behavior During Therapy  [facility] for tasks assessed/performed       Past Medical History:  Diagnosis Date  . Allergy   . Amyotrophic lateral sclerosis (ALS) ([place]) 09/23/2018  . Anemia   . Anxiety   . Arthritis   . Asthma   . Colon polyp   . Complication of anesthesia   . Diabetes mellitus   . Edema   . Fatigue   . Hypertension   . IBS (irritable bowel syndrome)   . Lump in female breast   . Methicillin resistant Staphylococcus aureus in conditions classified elsewhere and of unspecified site   . Migraines   . Morbid obesity ([place])   . Night sweats   . Nonspecific abnormal results of thyroid function study   . Other B-complex deficiencies   . Paresthesias   . PONV (postoperative nausea and vomiting)   . Pure hypercholesterolemia   . Reflux   . Sacral fracture ([place])   . Sinus problem   . Symptomatic states associated with artificial menopause   . Thyroid disease   . Type II or unspecified type diabetes mellitus without mention of complication, not stated as uncontrolled   . Unspecified sleep apnea     Past Surgical History:  Procedure Laterality Date  . abcess removal  1997   MRSA  . ABDOMINAL HYSTERECTOMY  1997   complete in 1997, partial was in 1995  . carpal tunnel release r   07/03/13   Dalldorf  . [place]  . CHOLECYSTECTOMY  03/1989  . COLONOSCOPY  03/21/2011   Normal.  [facility].  [name] KNEE SURGERY  2006  . LAPAROSCOPIC GASTRIC BANDING  05/2009  . TONSILLECTOMY AND ADENOIDECTOMY  1974  . TUBAL LIGATION    . ULNAR TUNNEL RELEASE Right 02/05/2018   Procedure: CUBITAL TUNNEL RELEASE/DECOMPRESSION;  Surgeon: Melrose Nakayama, MD;  Location: [facility];  Service: Orthopedics;  Laterality: Right;    There were no vitals filed for this visit.  Subjective Assessment - 10/10/18 1234    Subjective  Patient reports she has been doing well since her last session with no new falls.    Patient is accompained by:  Family member    Pertinent History   Patient fx her sacrum 9/18 and  she lost sensation to right foot, and she lost ability to DF her right foot. Patient had back surgery 03/2017 , she was sent home. She was walking with no device indoors and she could get out of the house and do the steps. June 18, 2017  her LLE was getting weaker and the right leg was getting stronger. Patient had follow up with neurosurgery. She was scheduled for a  steriod shot Jul 23, 2017, but on May 3 her legs didnt  feel right, she fell 4 times, she fractured her R fibula  on may 3rd, she went to hospital and spent one week. She went to SNF for 1 month. She had another neuro consult, resulting in kDx of  polyneuropathy. Then she had an apt with and [facility] said it was not polyneuropathy, (in [place])  She has a second consultation scheduled Dec 30 for a  Neuromuscular.consult.  She had a scan of the spine T 12-28-17  who then sent her to a thoracic surgeon at the end of November and was recommended to not due surgery. She does not know what her Dx is and she has been having home PT beginning August 25, 2017 . She has had HHPT 2 x  week for the last 5 months. She is able to walk 50 feet with RW. She is able to transfer from wc to stand independently.     How long can you stand comfortably?  5  mins    Patient Stated Goals  to walk without the RW, or walk better, use the bathroom    Currently in Pain?  Yes    Pain Score  2     Pain Location  Knee    Pain Orientation  Right;Left    Pain Descriptors / Indicators  Aching    Pain Type  Chronic pain    Pain Onset  More than a month ago    Pain Frequency  Intermittent        Neuromuscular Re-education  Pt reports that she has been having short duration vertigo recently (a few seconds) when rolling in bed or laying down from sitting. No prior history of vertigo and no similar episodes. No associated neurological symptoms. ROS negative for red flags. Pt does report some tinnitus and ear pain but no fevers or chills. No aural fullness or difficulty hearing. No visual loss or visual field deficits. No acute long duration (minutes to hours) episodes of vertigo. BPPV screening performed. Negative Dix-Hallpike and roll testing bilaterally for either nystagmus or vertigo. Pt does report some nausea but denies dizziness or lightheadedness. Pt provided education about BPPV and encouraged to monitor symptoms to report if they recur. Pt educated about signs/symptoms of stroke and encouraged to seek emergency medical care if these symptoms were to develop. If pt has recurrent episodes will repeat screening and if positive seek order from PCP for canalith repositioning treatment.   TherEx Supine <>sit supervision with Min A for LLE sit to supine position and supervision for supine to sit.  STS x 5 trials with varying hand position and height of plinth for increased difficulty level, no episodes of dizziness   supine:  Hamstring stretch 60 seconds each LE  Popliteal angle stretch 60 seconds each LE  Straight leg raise 10x each LE  Bridges: PT stabilization to lower extremities, cueing for posterior pelvic tilt, gluteal squeeze, and exhale during contraction 10x.  Hooklying:  LE rotation 60 seconds  Single leg march with TrA contraction 10x each  LE           Pt educated throughout session about proper posture and technique with exercises. Improved exercise technique, movement at target joints, use of target muscles after min to mod verbal, visual, tactile cues.             PT Education - 10/10/18 1235    Education provided  Yes    Education Details  exercise technique, manual    Person(s) Educated  Patient    Methods  Explanation;Demonstration;Tactile cues;Verbal cues    Comprehension  Verbalized understanding;Returned demonstration;Verbal cues required;Tactile cues required       PT Short Term Goals - 09/10/18 1058      PT SHORT TERM GOAL #1   Title  Patient will be independent in home exercise program to improve strength/mobility for better functional independence with ADLs.    Time  6    Period  Weeks    Status  Partially Met    Target Date  09/10/18        PT Long Term Goals - 09/10/18 1059      PT LONG TERM GOAL #1   Title  Patient will increase 10 meter walk test to >.50 m/s as to improve gait speed for better community ambulation and to reduce fall risk.    Baseline  . 36 m/sec    Time  8    Period  Weeks    Status  Partially Met    Target Date  11/05/18      PT LONG TERM GOAL #2   Title  Patient will transfer sit to stand  from 19 inch mat without assist or anyone supporting AD  to demonstrate improved LE strength to decrease falls risk.     Time  12    Period  Weeks    Status  Achieved    Target Date  09/10/18      PT LONG TERM GOAL #3   Title  Patient will demonstrate stance without AD  x 1 min to demonstrate decreased falls risk.     Time  12    Period  Weeks    Status  Achieved    Target Date  09/10/18      PT LONG TERM GOAL #4   Title  Patient will  ambulate 500 feet to work towards community ambulation distances.     Baseline  270 feet    Time  12    Period  Weeks    Status  New    Target Date  10/30/18      PT LONG TERM GOAL #5   Title  Patient (< 19 years old) will  complete five times sit to stand test in < 10 seconds indicating an increased LE strength and improved balance.    Baseline  20.28 m/sec    Time  12    Period  Weeks    Status  New    Target Date  12/03/18            Plan - 10/10/18 1239    Clinical Impression Statement  Patient presents with good motivation to today's session. No episodes of dizziness were brought on however some nausea did appear. Strengthening interventions continue to improve with improved fluidity of muscle contraction and sequencing of movements. Her sit to stands continue to improve with independence and decreased requirement of UE's.  She will continue to benefit from skilled PT to improve mobility and strength    Rehab Potential  Good    Clinical Impairments Affecting Rehab Potential  Multiple tests concerning for neurologic involvement     PT Frequency  2x / week    PT Duration  12 weeks    PT Treatment/Interventions  Gait training;Therapeutic activities;Therapeutic exercise;Balance training;Neuromuscular re-education;Patient/family education;Orthotic Fit/Training;Manual techniques    PT Next Visit Plan  get updated feedback on pain after leg press    PT Home  Exercise Plan  No updates this date     Consulted and Agree with Plan of Care  Patient       Patient will benefit from skilled therapeutic intervention in order to improve the following deficits and impairments:  Decreased balance, Decreased endurance, Decreased mobility, Difficulty walking, Impaired sensation, Decreased range of motion, Decreased activity tolerance, Decreased coordination, Decreased strength, Pain, Postural dysfunction  Visit Diagnosis: 1. Other lack of coordination   2. Muscle weakness (generalized)   3. Difficulty in walking, not elsewhere classified        Problem List Patient Active Problem List   Diagnosis Date Noted  . Migraine without status migrainosus, not intractable   . Chronic pain syndrome   . Lumbar disc  disease with radiculopathy 07/21/2017  . Lumbar foraminal stenosis 07/21/2017  . Unable to walk 07/21/2017  . Multiple falls 07/21/2017  . Displaced fracture of proximal end of right fibula 07/21/2017  . Lower extremity weakness 05/28/2017  . Right lower quadrant abdominal pain 08/22/2016  . Abnormal TSH 01/10/2016  . Morbid obesity ([place]) 01/12/2014  . Routine general medical examination at a health care facility 10/28/2012  . GERD (gastroesophageal reflux disease) 10/28/2012  . Anxiety 12/20/2011  . Allergic rhinitis 12/20/2011  . Asthma 12/20/2011  . Lapband APL with HH repair 03/31/2011  . Uncontrolled type 2 diabetes mellitus with hyperglycemia, with long-term current use of insulin ([place]) 06/30/2007  . Hyperlipidemia 06/30/2007  . Essential hypertension 06/27/2007   Janna Arch, PT, DPT   10/11/2018, 11:52 AM  [place] MAIN [facility] SERVICES [address] [place], [place], [address] Phone: [phone]   Fax:  [phone]  Name: NICHOLE NEYER MRN: [id number] Date of Birth: [birth year]

## 2018-10-14 ENCOUNTER — Ambulatory Visit: Payer: BC Managed Care – PPO | Admitting: Physical Therapy

## 2018-10-14 ENCOUNTER — Ambulatory Visit: Payer: BC Managed Care – PPO | Admitting: Occupational Therapy

## 2018-10-16 ENCOUNTER — Ambulatory Visit: Payer: BC Managed Care – PPO | Admitting: Physical Therapy

## 2018-10-16 ENCOUNTER — Encounter: Payer: Self-pay | Admitting: Physical Therapy

## 2018-10-16 ENCOUNTER — Other Ambulatory Visit: Payer: Self-pay

## 2018-10-16 DIAGNOSIS — M25551 Pain in right hip: Secondary | ICD-10-CM

## 2018-10-16 DIAGNOSIS — R262 Difficulty in walking, not elsewhere classified: Secondary | ICD-10-CM | POA: Diagnosis not present

## 2018-10-16 DIAGNOSIS — M6281 Muscle weakness (generalized): Secondary | ICD-10-CM

## 2018-10-16 DIAGNOSIS — M25571 Pain in right ankle and joints of right foot: Secondary | ICD-10-CM

## 2018-10-16 DIAGNOSIS — R29898 Other symptoms and signs involving the musculoskeletal system: Secondary | ICD-10-CM

## 2018-10-16 DIAGNOSIS — R278 Other lack of coordination: Secondary | ICD-10-CM

## 2018-10-16 NOTE — Therapy (Signed)
[place] MAIN [facility] SERVICES [address] [place], [place], [address] Phone: [phone]   Fax:  [phone]  Physical Therapy Treatment  Patient Details  Name: [name] MRN: [id number] Date of Birth: 1965-04-10 Referring Provider (PT): Cornell Barman   Encounter Date: 10/16/2018  PT End of Session - 10/16/18 1250    Visit Number  19    Number of Visits  25    Date for PT Re-Evaluation  10/29/18    PT Start Time  1140    PT Stop Time  1220    PT Time Calculation (min)  40 min    Equipment Utilized During Treatment  Gait belt;Other (comment)   AFO   Activity Tolerance  Patient tolerated treatment well;Patient limited by fatigue    Behavior During Therapy  [facility] for tasks assessed/performed       Past Medical History:  Diagnosis Date  . Allergy   . Amyotrophic lateral sclerosis (ALS) ([place]) 09/23/2018  . Anemia   . Anxiety   . Arthritis   . Asthma   . Colon polyp   . Complication of anesthesia   . Diabetes mellitus   . Edema   . Fatigue   . Hypertension   . IBS (irritable bowel syndrome)   . Lump in female breast   . Methicillin resistant Staphylococcus aureus in conditions classified elsewhere and of unspecified site   . Migraines   . Morbid obesity ([place])   . Night sweats   . Nonspecific abnormal results of thyroid function study   . Other B-complex deficiencies   . Paresthesias   . PONV (postoperative nausea and vomiting)   . Pure hypercholesterolemia   . Reflux   . Sacral fracture ([place])   . Sinus problem   . Symptomatic states associated with artificial menopause   . Thyroid disease   . Type II or unspecified type diabetes mellitus without mention of complication, not stated as uncontrolled   . Unspecified sleep apnea     Past Surgical History:  Procedure Laterality Date  . abcess removal  1997   MRSA  . ABDOMINAL HYSTERECTOMY  1997   complete in 1997, partial was in 1995  . carpal tunnel release r   07/03/13   Dalldorf  . [place]  . CHOLECYSTECTOMY  03/1989  . COLONOSCOPY  03/21/2011   Normal.  [facility].  [name] KNEE SURGERY  2006  . LAPAROSCOPIC GASTRIC BANDING  05/2009  . TONSILLECTOMY AND ADENOIDECTOMY  1974  . TUBAL LIGATION    . ULNAR TUNNEL RELEASE Right 02/05/2018   Procedure: CUBITAL TUNNEL RELEASE/DECOMPRESSION;  Surgeon: Melrose Nakayama, MD;  Location: [place];  Service: Orthopedics;  Laterality: Right;    There were no vitals filed for this visit.  Subjective Assessment - 10/16/18 1154    Subjective  Patient reports she has been doing well since her last session with no new falls.    Patient is accompained by:  Family member    Pertinent History   Patient fx her sacrum 9/18 and  she lost sensation to right foot, and she lost ability to DF her right foot. Patient had back surgery 03/2017 , she was sent home. She was walking with no device indoors and she could get out of the house and do the steps. June 18, 2017  her LLE was getting weaker and the right leg was getting stronger. Patient had follow up with neurosurgery. She was scheduled for a  steriod shot Jul 23, 2017, but on May 3 her legs didnt  feel right, she fell 4 times, she fractured her R fibula  on may 3rd, she went to hospital and spent one week. She went to SNF for 1 month. She had another neuro consult, resulting in kDx of  polyneuropathy. Then she had an apt with and [facility] said it was not polyneuropathy, (in [place])  She has a second consultation scheduled Dec 30 for a  Neuromuscular.consult.  She had a scan of the spine T 12-28-17  who then sent her to a thoracic surgeon at the end of November and was recommended to not due surgery. She does not know what her Dx is and she has been having home PT beginning August 25, 2017 . She has had HHPT 2 x  week for the last 5 months. She is able to walk 50 feet with RW. She is able to transfer from wc to stand independently.     How long can you stand comfortably?  5  mins    Patient Stated Goals  to walk without the RW, or walk better, use the bathroom    Currently in Pain?  No/denies    Pain Score  0-No pain    Pain Onset  More than a month ago         Treatment: TM walking . 4 miles / hour with B short leg braces, UE support and 4 mins 45 sec Ascending 2 steps with RW and min assist/ descending 2 steps with RW and min assist Seated hip flex x 20  Seated knee flex x 20 with GTB LLE, RTB RLE    Patient required min VCs for balance stability, including to increase trunk control for less loss of balance with smaller base of support                      PT Education - 10/16/18 1154    Education provided  Yes    Education Details  HEP    Person(s) Educated  Patient    Methods  Explanation    Comprehension  Verbalized understanding;Returned demonstration;Tactile cues required       PT Short Term Goals - 09/10/18 1058      PT SHORT TERM GOAL #1   Title  Patient will be independent in home exercise program to improve strength/mobility for better functional independence with ADLs.    Time  6    Period  Weeks    Status  Partially Met    Target Date  09/10/18        PT Long Term Goals - 09/10/18 1059      PT LONG TERM GOAL #1   Title  Patient will increase 10 meter walk test to >.50 m/s as to improve gait speed for better community ambulation and to reduce fall risk.    Baseline  . 36 m/sec    Time  8    Period  Weeks    Status  Partially Met    Target Date  11/05/18      PT LONG TERM GOAL #2   Title  Patient will transfer sit to stand  from 19 inch mat without assist or anyone supporting AD  to demonstrate improved LE strength to decrease falls risk.     Time  12    Period  Weeks    Status  Achieved    Target Date  09/10/18  PT LONG TERM GOAL #3   Title  Patient will demonstrate stance without AD  x 1 min to demonstrate decreased falls risk.     Time  12    Period  Weeks    Status  Achieved    Target Date   09/10/18      PT LONG TERM GOAL #4   Title  Patient will  ambulate 500 feet to work towards community ambulation distances.     Baseline  270 feet    Time  12    Period  Weeks    Status  New    Target Date  10/30/18      PT LONG TERM GOAL #5   Title  Patient (< 19 years old) will complete five times sit to stand test in < 10 seconds indicating an increased LE strength and improved balance.    Baseline  20.28 m/sec    Time  12    Period  Weeks    Status  New    Target Date  12/03/18            Plan - 10/16/18 1302    Clinical Impression Statement  Patient is able to ambulate on the TM 4 mins 45 sec at . 4 m/sec. . She  performs seated LE exercises today and transfers sit to stand using less assist; she also ascends and descends 2 steps with less assist. She will conintue to benefit from skilled PT to improve mobility, and saftey.    Rehab Potential  Good    Clinical Impairments Affecting Rehab Potential  Multiple tests concerning for neurologic involvement     PT Frequency  2x / week    PT Duration  12 weeks    PT Treatment/Interventions  Gait training;Therapeutic activities;Therapeutic exercise;Balance training;Neuromuscular re-education;Patient/family education;Orthotic Fit/Training;Manual techniques    PT Next Visit Plan  get updated feedback on pain after leg press    PT Home Exercise Plan  No updates this date     Consulted and Agree with Plan of Care  Patient       Patient will benefit from skilled therapeutic intervention in order to improve the following deficits and impairments:  Decreased balance, Decreased endurance, Decreased mobility, Difficulty walking, Impaired sensation, Decreased range of motion, Decreased activity tolerance, Decreased coordination, Decreased strength, Pain, Postural dysfunction  Visit Diagnosis: 1. Muscle weakness (generalized)   2. Other lack of coordination   3. Difficulty in walking, not elsewhere classified   4. Pain in right hip    5. Pain in right ankle and joints of right foot   6. Weakness of right leg        Problem List Patient Active Problem List   Diagnosis Date Noted  . Migraine without status migrainosus, not intractable   . Chronic pain syndrome   . Lumbar disc disease with radiculopathy 07/21/2017  . Lumbar foraminal stenosis 07/21/2017  . Unable to walk 07/21/2017  . Multiple falls 07/21/2017  . Displaced fracture of proximal end of right fibula 07/21/2017  . Lower extremity weakness 05/28/2017  . Right lower quadrant abdominal pain 08/22/2016  . Abnormal TSH 01/10/2016  . Morbid obesity ([place]) 01/12/2014  . Routine general medical examination at a health care facility 10/28/2012  . GERD (gastroesophageal reflux disease) 10/28/2012  . Anxiety 12/20/2011  . Allergic rhinitis 12/20/2011  . Asthma 12/20/2011  . Lapband APL with HH repair 03/31/2011  . Uncontrolled type 2 diabetes mellitus with hyperglycemia, with long-term current use of insulin (  [place]) 06/30/2007  . Hyperlipidemia 06/30/2007  . Essential hypertension 06/27/2007    Alanson Puls, PT DPT 10/16/2018, 1:05 PM  [place] MAIN [facility] SERVICES [address] [place], [place], [address] Phone: [phone]   Fax:  [phone]  Name: PORCHEA CHARRIER MRN: [id number] Date of Birth: [birth year]

## 2018-10-18 ENCOUNTER — Other Ambulatory Visit: Payer: Self-pay | Admitting: Primary Care

## 2018-10-18 DIAGNOSIS — Z794 Long term (current) use of insulin: Secondary | ICD-10-CM

## 2018-10-18 DIAGNOSIS — E1140 Type 2 diabetes mellitus with diabetic neuropathy, unspecified: Secondary | ICD-10-CM

## 2018-10-20 MED ORDER — GABAPENTIN 300 MG PO CAPS: 600.0000 mg | ORAL_CAPSULE | Freq: Two times a day (BID) | ORAL | 1 refills | Status: DC

## 2018-10-21 ENCOUNTER — Ambulatory Visit: Payer: BC Managed Care – PPO | Attending: Primary Care | Admitting: Physical Therapy

## 2018-10-21 ENCOUNTER — Encounter: Payer: Self-pay | Admitting: Physical Therapy

## 2018-10-21 ENCOUNTER — Other Ambulatory Visit: Payer: Self-pay

## 2018-10-21 ENCOUNTER — Ambulatory Visit: Payer: BC Managed Care – PPO | Admitting: Occupational Therapy

## 2018-10-21 DIAGNOSIS — M6281 Muscle weakness (generalized): Secondary | ICD-10-CM

## 2018-10-21 DIAGNOSIS — R29898 Other symptoms and signs involving the musculoskeletal system: Secondary | ICD-10-CM

## 2018-10-21 DIAGNOSIS — M25551 Pain in right hip: Secondary | ICD-10-CM | POA: Insufficient documentation

## 2018-10-21 DIAGNOSIS — R278 Other lack of coordination: Secondary | ICD-10-CM

## 2018-10-21 DIAGNOSIS — R262 Difficulty in walking, not elsewhere classified: Secondary | ICD-10-CM | POA: Insufficient documentation

## 2018-10-21 DIAGNOSIS — M25571 Pain in right ankle and joints of right foot: Secondary | ICD-10-CM | POA: Insufficient documentation

## 2018-10-21 NOTE — Therapy (Signed)
[place] MAIN [facility] SERVICES [address] [place], [place], [address] Phone: [phone]   Fax:  [phone]  Occupational Therapy Treatment  Patient Details  Name: [name] MRN: [id number] Date of Birth: [birth year] Referring Provider (OT): Alma Friendly, MD   Encounter Date: 10/21/2018  OT End of Session - 10/21/18 1504    Visit Number  3    Number of Visits  24    Date for OT Re-Evaluation  12/17/18    Authorization Type  BCBS progress report period starting 09-24-18    OT Start Time  1445    OT Stop Time  1530    OT Time Calculation (min)  45 min    Activity Tolerance  Patient tolerated treatment well    Behavior During Therapy  [facility] for tasks assessed/performed       Past Medical History:  Diagnosis Date  . Allergy   . Amyotrophic lateral sclerosis (ALS) ([place]) 09/23/2018  . Anemia   . Anxiety   . Arthritis   . Asthma   . Colon polyp   . Complication of anesthesia   . Diabetes mellitus   . Edema   . Fatigue   . Hypertension   . IBS (irritable bowel syndrome)   . Lump in female breast   . Methicillin resistant Staphylococcus aureus in conditions classified elsewhere and of unspecified site   . Migraines   . Morbid obesity ([place])   . Night sweats   . Nonspecific abnormal results of thyroid function study   . Other B-complex deficiencies   . Paresthesias   . PONV (postoperative nausea and vomiting)   . Pure hypercholesterolemia   . Reflux   . Sacral fracture ([place])   . Sinus problem   . Symptomatic states associated with artificial menopause   . Thyroid disease   . Type II or unspecified type diabetes mellitus without mention of complication, not stated as uncontrolled   . Unspecified sleep apnea     Past Surgical History:  Procedure Laterality Date  . abcess removal  1997   MRSA  . ABDOMINAL HYSTERECTOMY  1997   complete in 1997, partial was in 1995  . carpal tunnel release r  07/03/13   Dalldorf  .  [place]  . CHOLECYSTECTOMY  03/1989  . COLONOSCOPY  03/21/2011   Normal.  [facility].  [name] KNEE SURGERY  2006  . LAPAROSCOPIC GASTRIC BANDING  05/2009  . TONSILLECTOMY AND ADENOIDECTOMY  1974  . TUBAL LIGATION    . ULNAR TUNNEL RELEASE Right 02/05/2018   Procedure: CUBITAL TUNNEL RELEASE/DECOMPRESSION;  Surgeon: Melrose Nakayama, MD;  Location: [facility];  Service: Orthopedics;  Laterality: Right;    There were no vitals filed for this visit.  Subjective Assessment - 10/21/18 1450    Subjective   Pt reported feeling really tired and has been using both hands to help with church activity involving placing items in ziplocks.    Patient is accompanied by:  Family member    Pertinent History  [name] was recently diagnosed with ALS at [facility].  She has had a recent decrease in strength in L side of body, UE and LE with decreased coordination.  She feels like she is dropping cups more and needs to use both hands for hand to mouth and handwriting has decreased to almost illegible.    Limitations  weakness    Patient Stated Goals  I want to work on  handwriting, being able to hold a cup again and regain strength and coordination in both hands.    Currently in Pain?  No/denies      Self care skills:     Patient seen to discuss adaptive aids to help with independence in toileting hygiene with rec for [facility], a rocker knife to help with cutting meat, a swivel cushion and overhead strap to help with car transfers, sock aid for LB dressing and writing aid.  Practiced using larger red pen with weight which she liked and assisted with writing skills for single words with 75% legibility.     Neuromuscular re-ed:        Worked on training and education on position of R thumb to index finger to manipulate, hold and push red raised thumb tacks into cork board.  Mod cues and min assist needed to be successful with follow through.  Patient is very motivated to accomplish tasks  even if it takes longer.  Practiced using visual feedback for completing with L hand and then R hand for muscle memory.                       OT Education - 10/21/18 1453    Education Details  HEP for fine motor exercises and control    Person(s) Educated  Patient    Methods  Explanation;Demonstration;Verbal cues    Comprehension  Verbalized understanding;Returned demonstration          OT Long Term Goals - 09/24/18 1133      OT LONG TERM GOAL #1   Title  Pt will complete self feeding using adaptive utensils and compenstory techniques using R hand.    Baseline  Pt needs help with holding cup, cutting meat and uses L hand as assist.    Time  12    Period  Weeks    Status  New    Target Date  12/17/18      OT LONG TERM GOAL #2   Title  Pt will complete LB dressing using adaptive equipment with min assist and cues in unsupported sitting.    Baseline  Pt is dependent on her husband for socks, shoes, AFOs and pants over hips 50% of time.    Time  12    Period  Weeks    Status  New    Target Date  12/17/18      OT LONG TERM GOAL #3   Title  Pt will complete toileting and hygiene with adaptive aid with min assist and cues on elevated toilet with use of grab bar and FWW for balance.    Baseline  Pt unable to complete hygiene after BM; able to complete Independently after urinating.    Time  12    Period  Weeks    Status  New    Target Date  12/17/18      OT LONG TERM GOAL #4   Title  Pt will be educated in HEP for strengtening and fine motor coordination.    Baseline  Pt does not have any HEP for BUEs or hands.    Time  12    Period  Weeks    Status  New    Target Date  12/17/18      OT LONG TERM GOAL #5   Title  Pt will increase strength  in B hands for use during ADLs by 4#.    Time  12    Period  Weeks    Status  New    Target Date  12/17/18      Long Term Additional Goals   Additional Long Term Goals  Yes      OT LONG TERM GOAL #6   Title  Pt  will increase strength 2# in B pinch for lateral and 3 point for use during ADLs.    Baseline  R 4# L 3#    Time  12    Period  Weeks    Status  New    Target Date  12/17/18            Plan - 10/21/18 1505    Clinical Impression Statement  Pt seen after PT session and was tired but eager to keep working hard.  She continues to have decreased dexterity in R hand especially with 2 point pinch which was worked on during session using red thumb tacks on corkboard.  Used overflow technique with L hand to help train R hand with good follow through.  Discussed adaptive aids such as Bottom Buddy to help with toilet hygiene, swivel cushion to help with car transfers, sock aid and reacher for dressing and writing aids.  Patient given the [employer] catalog to review at home.   She continues to have weakness in RUE with decreased fine motor skills.  She continues to be motivated to improve strength, coordination and independence in self care skills and IADLs with adaptive aids, techniques and training.    OT Occupational Profile and History  Detailed Assessment- Review of Records and additional review of physical, cognitive, psychosocial history related to current functional performance    Occupational performance deficits (Please refer to evaluation for details):  ADL's;IADL's;Education;Leisure    Body Structure / Function / Physical Skills  Strength;Gait;FMC;IADL;Balance;ADL;Coordination;Endurance;UE functional use;Mobility    Rehab Potential  Good    Clinical Decision Making  Several treatment options, min-mod task modification necessary    Comorbidities Affecting Occupational Performance:  Presence of comorbidities impacting occupational performance    Comorbidities impacting occupational performance description:  progressive muscle weakness, balance, coordination    OT Frequency  2x / week    OT Duration  12 weeks    OT Treatment/Interventions  Moist Heat;Self-care/ADL training;Therapeutic  exercise;Patient/family education;Energy conservation;Therapist, nutritional;Therapeutic activities;Balance training;DME and/or AE instruction;Manual Therapy;Psychosocial skills training    Consulted and Agree with Plan of Care  Patient       Patient will benefit from skilled therapeutic intervention in order to improve the following deficits and impairments:   Body Structure / Function / Physical Skills: Strength, Gait, FMC, IADL, Balance, ADL, Coordination, Endurance, UE functional use, Mobility       Visit Diagnosis: 1. Muscle weakness (generalized)   2. Other lack of coordination       Problem List Patient Active Problem List   Diagnosis Date Noted  . Migraine without status migrainosus, not intractable   . Chronic pain syndrome   . Lumbar disc disease with radiculopathy 07/21/2017  . Lumbar foraminal stenosis 07/21/2017  . Unable to walk 07/21/2017  . Multiple falls 07/21/2017  . Displaced fracture of proximal end of right fibula 07/21/2017  . Lower extremity weakness 05/28/2017  . Right lower quadrant abdominal pain 08/22/2016  . Abnormal TSH 01/10/2016  . Morbid obesity ([place]) 01/12/2014  . Routine general medical examination at a health care facility 10/28/2012  . GERD (gastroesophageal reflux disease) 10/28/2012  . Anxiety 12/20/2011  . Allergic rhinitis 12/20/2011  . Asthma 12/20/2011  . Lapband  APL with HH repair 03/31/2011  . Uncontrolled type 2 diabetes mellitus with hyperglycemia, with long-term current use of insulin ([place]) 06/30/2007  . Hyperlipidemia 06/30/2007  . Essential hypertension 06/27/2007    Chrys Racer, OTR/L, [place] [phone] 10/21/18, 3:54 PM  [place] MAIN [facility] SERVICES [address] [place], [place], [address] Phone: [phone]   Fax:  [phone]  Name: MIGDALIA OLEJNICZAK MRN: [id number] Date of Birth: [birth year]

## 2018-10-23 ENCOUNTER — Ambulatory Visit: Payer: BC Managed Care – PPO | Admitting: Occupational Therapy

## 2018-10-23 ENCOUNTER — Ambulatory Visit: Payer: BC Managed Care – PPO | Admitting: Physical Therapy

## 2018-10-23 ENCOUNTER — Encounter: Payer: Self-pay | Admitting: Physical Therapy

## 2018-10-23 ENCOUNTER — Other Ambulatory Visit: Payer: Self-pay

## 2018-10-23 ENCOUNTER — Encounter: Payer: Self-pay | Admitting: Occupational Therapy

## 2018-10-23 DIAGNOSIS — M25551 Pain in right hip: Secondary | ICD-10-CM

## 2018-10-23 DIAGNOSIS — M6281 Muscle weakness (generalized): Secondary | ICD-10-CM | POA: Diagnosis not present

## 2018-10-23 DIAGNOSIS — M25571 Pain in right ankle and joints of right foot: Secondary | ICD-10-CM

## 2018-10-23 DIAGNOSIS — R278 Other lack of coordination: Secondary | ICD-10-CM

## 2018-10-23 DIAGNOSIS — R262 Difficulty in walking, not elsewhere classified: Secondary | ICD-10-CM

## 2018-10-23 DIAGNOSIS — R29898 Other symptoms and signs involving the musculoskeletal system: Secondary | ICD-10-CM

## 2018-10-23 NOTE — Therapy (Signed)
[place] MAIN [facility] SERVICES [address] [place], [place], [address] Phone: [phone]   Fax:  [phone]  Occupational Therapy Treatment  Patient Details  Name: [name] MRN: [id number] Date of Birth: 1966-01-22 Referring Provider (OT): Maria Friendly, MD   Encounter Date: 10/23/2018  OT End of Session - 10/23/18 1912    Visit Number  4    Number of Visits  24    Date for OT Re-Evaluation  12/17/18    Authorization Type  BCBS progress report period starting 09-24-18    OT Start Time  1100    OT Stop Time  1145    OT Time Calculation (min)  45 min    Activity Tolerance  Patient tolerated treatment well    Behavior During Therapy  [facility] for tasks assessed/performed       Past Medical History:  Diagnosis Date  . Allergy   . Amyotrophic lateral sclerosis (ALS) ([place]) 09/23/2018  . Anemia   . Anxiety   . Arthritis   . Asthma   . Colon polyp   . Complication of anesthesia   . Diabetes mellitus   . Edema   . Fatigue   . Hypertension   . IBS (irritable bowel syndrome)   . Lump in female breast   . Methicillin resistant Staphylococcus aureus in conditions classified elsewhere and of unspecified site   . Migraines   . Morbid obesity ([place])   . Night sweats   . Nonspecific abnormal results of thyroid function study   . Other B-complex deficiencies   . Paresthesias   . PONV (postoperative nausea and vomiting)   . Pure hypercholesterolemia   . Reflux   . Sacral fracture ([place])   . Sinus problem   . Symptomatic states associated with artificial menopause   . Thyroid disease   . Type II or unspecified type diabetes mellitus without mention of complication, not stated as uncontrolled   . Unspecified sleep apnea     Past Surgical History:  Procedure Laterality Date  . abcess removal  1997   MRSA  . ABDOMINAL HYSTERECTOMY  1997   complete in 1997, partial was in 1995  . carpal tunnel release r  07/03/13   [place]orf  .  [place]  . CHOLECYSTECTOMY  03/1989  . COLONOSCOPY  03/21/2011   Normal.  [facility].  [name] KNEE SURGERY  2006  . LAPAROSCOPIC GASTRIC BANDING  05/2009  . TONSILLECTOMY AND ADENOIDECTOMY  1974  . TUBAL LIGATION    . ULNAR TUNNEL RELEASE Right 02/05/2018   Procedure: CUBITAL TUNNEL RELEASE/DECOMPRESSION;  Surgeon: Melrose Nakayama, MD;  Location: [facility];  Service: Orthopedics;  Laterality: Right;    There were no vitals filed for this visit.  Subjective Assessment - 10/23/18 1105    Subjective   Patient reports she was able to order some of the supplies that were recommended last session,    Pertinent History  [name] was recently diagnosed with ALS at [facility].  She has had a recent decrease in strength in L side of body, UE and LE with decreased coordination.  She feels like she is dropping cups more and needs to use both hands for hand to mouth and handwriting has decreased to almost illegible.    Patient Stated Goals  I want to work on handwriting, being able to hold a cup again and regain strength and coordination in both hands.    Currently in  Pain?  Yes    Pain Score  2     Pain Location  Back    Pain Orientation  Lower    Pain Descriptors / Indicators  Aching    Pain Type  Chronic pain    Pain Onset  More than a month ago    Pain Frequency  Intermittent       Patient reports she was able to purchase some adaptive equipment that was recommended from last session such as a rock or knife, stockade, bottom buddy, and set of utensils.   Therapeutic Exercise:  Patient was seen for strengthening exercises with sustained grip strength with right hand 11 pounds for 25 repetitions she was unable to perform the next setting at 17 pounds. First I can't say that she was able to perform 15 repetitions and then the last 10 repetitions was performed at 6 pounds of pressure. She also perform task with her left hand with one set of 25 repetitions at 11 pounds and the  second sat at 6 pounds of pressure for 25 repetitions. Verbal cues and therapist demo at times for proper hand placement on hand gripper.   Neuromuscular Reeducation Attempted finger strengthening with manipulation of small snap beads however she was unable to perform so we switched to the task of picking up the pieces with focus on prevention patterns with cues. Patient was able to perform ball pegs with bilateral upper extremities and was able to turn and manipulate with minimal difficulty.  Response to tx:  Patient continues to demonstrate muscle weakness, and lack of coordination bilaterally. Her right ring finger and small finger hyperextended at the MP joints and the PIP joints remain in flexion. If MPs are blocked she is able to flex and extend at PIP it. Continue to work towards goals to increase independence and daily tasks.                      OT Education - 10/23/18 1912    Education Details  HEP for fine motor exercises and control    Person(s) Educated  Patient    Methods  Explanation;Demonstration;Verbal cues    Comprehension  Verbalized understanding;Returned demonstration          OT Long Term Goals - 09/24/18 1133      OT LONG TERM GOAL #1   Title  Pt will complete self feeding using adaptive utensils and compenstory techniques using R hand.    Baseline  Pt needs help with holding cup, cutting meat and uses L hand as assist.    Time  12    Period  Weeks    Status  New    Target Date  12/17/18      OT LONG TERM GOAL #2   Title  Pt will complete LB dressing using adaptive equipment with min assist and cues in unsupported sitting.    Baseline  Pt is dependent on her husband for socks, shoes, AFOs and pants over hips 50% of time.    Time  12    Period  Weeks    Status  New    Target Date  12/17/18      OT LONG TERM GOAL #3   Title  Pt will complete toileting and hygiene with adaptive aid with min assist and cues on elevated toilet with use of  grab bar and FWW for balance.    Baseline  Pt unable to complete hygiene after BM; able to complete Independently after  urinating.    Time  12    Period  Weeks    Status  New    Target Date  12/17/18      OT LONG TERM GOAL #4   Title  Pt will be educated in HEP for strengtening and fine motor coordination.    Baseline  Pt does not have any HEP for BUEs or hands.    Time  12    Period  Weeks    Status  New    Target Date  12/17/18      OT LONG TERM GOAL #5   Title  Pt will increase strength  in B hands for use during ADLs by 4#.    Time  12    Period  Weeks    Status  New    Target Date  12/17/18      Long Term Additional Goals   Additional Long Term Goals  Yes      OT LONG TERM GOAL #6   Title  Pt will increase strength 2# in B pinch for lateral and 3 point for use during ADLs.    Baseline  R 4# L 3#    Time  12    Period  Weeks    Status  New    Target Date  12/17/18            Plan - 10/23/18 1913    Clinical Impression Statement  Patient continues to demonstrate muscle weakness, and lack of coordination bilaterally. Her right ring finger and small finger hyperextended at the MP joints and the PIP joints remain in flexion. If MPs are blocked she is able to flex and extend at PIP it. Continue to work towards goals to increase independence and daily tasks.    OT Occupational Profile and History  Detailed Assessment- Review of Records and additional review of physical, cognitive, psychosocial history related to current functional performance    Occupational performance deficits (Please refer to evaluation for details):  ADL's;IADL's;Education;Leisure    Body Structure / Function / Physical Skills  Strength;Gait;FMC;IADL;Balance;ADL;Coordination;Endurance;UE functional use;Mobility    Clinical Decision Making  Several treatment options, min-mod task modification necessary    Comorbidities Affecting Occupational Performance:  Presence of comorbidities impacting occupational  performance    Comorbidities impacting occupational performance description:  progressive muscle weakness, balance, coordination    Modification or Assistance to Complete Evaluation   Min-Moderate modification of tasks or assist with assess necessary to complete eval    OT Frequency  2x / week    OT Duration  12 weeks    OT Treatment/Interventions  Moist Heat;Self-care/ADL training;Therapeutic exercise;Patient/family education;Energy conservation;Therapist, nutritional;Therapeutic activities;Balance training;DME and/or AE instruction;Manual Therapy;Psychosocial skills training    Plan  Rec continued OT 2x per week for 12 weeks    Consulted and Agree with Plan of Care  Patient       Patient will benefit from skilled therapeutic intervention in order to improve the following deficits and impairments:   Body Structure / Function / Physical Skills: Strength, Gait, FMC, IADL, Balance, ADL, Coordination, Endurance, UE functional use, Mobility       Visit Diagnosis: 1. Muscle weakness (generalized)   2. Other lack of coordination       Problem List Patient Active Problem List   Diagnosis Date Noted  . Migraine without status migrainosus, not intractable   . Chronic pain syndrome   . Lumbar disc disease with radiculopathy 07/21/2017  . Lumbar foraminal stenosis 07/21/2017  .  Unable to walk 07/21/2017  . Multiple falls 07/21/2017  . Displaced fracture of proximal end of right fibula 07/21/2017  . Lower extremity weakness 05/28/2017  . Right lower quadrant abdominal pain 08/22/2016  . Abnormal TSH 01/10/2016  . Morbid obesity ([place]) 01/12/2014  . Routine general medical examination at a health care facility 10/28/2012  . GERD (gastroesophageal reflux disease) 10/28/2012  . Anxiety 12/20/2011  . Allergic rhinitis 12/20/2011  . Asthma 12/20/2011  . Lapband APL with HH repair 03/31/2011  . Uncontrolled type 2 diabetes mellitus with hyperglycemia, with long-term current use of  insulin ([place]) 06/30/2007  . Hyperlipidemia 06/30/2007  . Essential hypertension 06/27/2007   Achilles Dunk, OTR/L, CLT  [place]ovett,Amy 10/23/2018, 7:16 PM  [place] MAIN [facility] SERVICES [address] [place], [place], [address] Phone: [phone]   Fax:  [phone]  Name: [name] MRN: [id number] Date of Birth: 07-21-1965

## 2018-10-23 NOTE — Therapy (Signed)
[place] MAIN [facility] SERVICES [address] [place], [place], [address] Phone: [phone]   Fax:  [phone]  Physical Therapy Treatment Physical Therapy Progress Note   Dates of reporting period   09/10/18  to   10/23/18  Patient Details  Name: [name] MRN: [id number] Date of Birth: 07-11-1965 Referring Provider (PT): Cornell Barman   Encounter Date: 10/23/2018  PT End of Session - 10/23/18 1158    Visit Number  20    Number of Visits  25    Date for PT Re-Evaluation  10/29/18    PT Start Time  2482    PT Stop Time  1230    PT Time Calculation (min)  45 min    Equipment Utilized During Treatment  Gait belt;Other (comment)   AFO   Activity Tolerance  Patient tolerated treatment well;Patient limited by fatigue    Behavior During Therapy  [facility] for tasks assessed/performed       Past Medical History:  Diagnosis Date  . Allergy   . Amyotrophic lateral sclerosis (ALS) ([place]) 09/23/2018  . Anemia   . Anxiety   . Arthritis   . Asthma   . Colon polyp   . Complication of anesthesia   . Diabetes mellitus   . Edema   . Fatigue   . Hypertension   . IBS (irritable bowel syndrome)   . Lump in female breast   . Methicillin resistant Staphylococcus aureus in conditions classified elsewhere and of unspecified site   . Migraines   . Morbid obesity ([place])   . Night sweats   . Nonspecific abnormal results of thyroid function study   . Other B-complex deficiencies   . Paresthesias   . PONV (postoperative nausea and vomiting)   . Pure hypercholesterolemia   . Reflux   . Sacral fracture ([place])   . Sinus problem   . Symptomatic states associated with artificial menopause   . Thyroid disease   . Type II or unspecified type diabetes mellitus without mention of complication, not stated as uncontrolled   . Unspecified sleep apnea     Past Surgical History:  Procedure Laterality Date  . abcess removal  1997   MRSA  . ABDOMINAL  HYSTERECTOMY  1997   complete in 1997, partial was in 1995  . carpal tunnel release r  07/03/13   Dalldorf  . [place]  . CHOLECYSTECTOMY  03/1989  . COLONOSCOPY  03/21/2011   Normal.  [facility].  [name] KNEE SURGERY  2006  . LAPAROSCOPIC GASTRIC BANDING  05/2009  . TONSILLECTOMY AND ADENOIDECTOMY  1974  . TUBAL LIGATION    . ULNAR TUNNEL RELEASE Right 02/05/2018   Procedure: CUBITAL TUNNEL RELEASE/DECOMPRESSION;  Surgeon: Melrose Nakayama, MD;  Location: [place];  Service: Orthopedics;  Laterality: Right;    There were no vitals filed for this visit.  Subjective Assessment - 10/23/18 1157    Subjective  Patient reports she has been doing well since her last session with no new falls.    Patient is accompained by:  Family member    Pertinent History   Patient fx her sacrum 9/18 and  she lost sensation to right foot, and she lost ability to DF her right foot. Patient had back surgery 03/2017 , she was sent home. She was walking with no device indoors and she could get out of the house and do the steps. June 18, 2017  her LLE was getting weaker  and the right leg was getting stronger. Patient had follow up with neurosurgery. She was scheduled for a steriod shot Jul 23, 2017, but on May 3 her legs didnt  feel right, she fell 4 times, she fractured her R fibula  on may 3rd, she went to hospital and spent one week. She went to SNF for 1 month. She had another neuro consult, resulting in kDx of  polyneuropathy. Then she had an apt with and [facility] said it was not polyneuropathy, (in [place])  She has a second consultation scheduled Dec 30 for a  Neuromuscular.consult.  She had a scan of the spine T 12-28-17  who then sent her to a thoracic surgeon at the end of November and was recommended to not due surgery. She does not know what her Dx is and she has been having home PT beginning August 25, 2017 . She has had HHPT 2 x  week for the last 5 months. She is able to walk 50 feet with RW. She is able  to transfer from wc to stand independently.     How long can you stand comfortably?  5 mins    Patient Stated Goals  to walk without the RW, or walk better, use the bathroom    Currently in Pain?  Yes    Pain Score  1     Pain Location  Back    Pain Orientation  Lower    Pain Descriptors / Indicators  Aching    Pain Type  Chronic pain    Pain Radiating Towards  na    Pain Onset  More than a month ago    Aggravating Factors   na    Pain Relieving Factors  na    Effect of Pain on Daily Activities  na    Multiple Pain Sites  No       Treatment: Outcome measures performed for goals to be reviewed' 6MW, 10 MW, 5 x sit to stand Goals are all making progress with improvements in gait speed and gait distance and safety including decreased falls risk  Therapeutic exercise; Nu-step x  10 mins L 4 UE and LE Sit to stand from chair level and wc level to RW with supervision Reviewed HEP for continued progression  Patient needs VC for correct cues and safety.                        PT Education - 10/23/18 1158    Education provided  Yes    Education Details  hep, plan of care and goals    Person(s) Educated  Patient    Methods  Explanation    Comprehension  Verbalized understanding       PT Short Term Goals - 10/23/18 1159      PT SHORT TERM GOAL #1   Title  Patient will be independent in home exercise program to improve strength/mobility for better functional independence with ADLs.    Time  6    Period  Weeks    Status  Partially Met    Target Date  09/10/18        PT Long Term Goals - 10/23/18 1159      PT LONG TERM GOAL #1   Title  Patient will increase 10 meter walk test to >.50 m/s as to improve gait speed for better community ambulation and to reduce fall risk.    Baseline  . 36 m/sec,, 10/23/18 =.43 m/ sec  Time  8    Period  Weeks    Status  Partially Met    Target Date  12/31/18      PT LONG TERM GOAL #2   Title  Patient will transfer sit  to stand  from 19 inch mat without assist or anyone supporting AD  to demonstrate improved LE strength to decrease falls risk.     Time  12    Period  Weeks    Status  Achieved      PT LONG TERM GOAL #3   Title  Patient will demonstrate stance without AD  x 1 min to demonstrate decreased falls risk.     Baseline  10/23/18 =    Time  12    Period  Weeks    Status  Achieved      PT LONG TERM GOAL #4   Title  Patient will  ambulate 500 feet to work towards community ambulation distances.     Baseline  270 feet, 10/23/18= 305 feet    Time  12    Period  Weeks    Status  Partially Met    Target Date  12/31/18      PT LONG TERM GOAL #5   Title  Patient (< 58 years old) will complete five times sit to stand test in < 10 seconds indicating an increased LE strength and improved balance.    Baseline  20.28 m/sec. 10/23/18 =16.82 ( from green chair)    Time  12    Period  Weeks    Status  Partially Met    Target Date  12/31/18            Plan - 10/23/18 1212    Clinical Impression Statement  Patient's condition has the potential to improve in response to therapy. Maximum improvement is yet to be obtained. The anticipated improvement is attainable and reasonable in a generally predictable time.  Patient reports that she is able to stand longer and is able to move better. She is progressing with towards her goals and is improving in strength and outcome measures. She will continue to benefit from skilled PT to reach goals and decrease her falls risk.    Rehab Potential  Good    Clinical Impairments Affecting Rehab Potential  Multiple tests concerning for neurologic involvement     PT Frequency  2x / week    PT Duration  12 weeks    PT Treatment/Interventions  Gait training;Therapeutic activities;Therapeutic exercise;Balance training;Neuromuscular re-education;Patient/family education;Orthotic Fit/Training;Manual techniques    PT Next Visit Plan  get updated feedback on pain after leg press     PT Home Exercise Plan  No updates this date     Consulted and Agree with Plan of Care  Patient       Patient will benefit from skilled therapeutic intervention in order to improve the following deficits and impairments:  Decreased balance, Decreased endurance, Decreased mobility, Difficulty walking, Impaired sensation, Decreased range of motion, Decreased activity tolerance, Decreased coordination, Decreased strength, Pain, Postural dysfunction  Visit Diagnosis: 1. Muscle weakness (generalized)   2. Other lack of coordination   3. Difficulty in walking, not elsewhere classified   4. Pain in right hip   5. Pain in right ankle and joints of right foot   6. Weakness of right leg        Problem List Patient Active Problem List   Diagnosis Date Noted  . Migraine without status migrainosus, not intractable   .  Chronic pain syndrome   . Lumbar disc disease with radiculopathy 07/21/2017  . Lumbar foraminal stenosis 07/21/2017  . Unable to walk 07/21/2017  . Multiple falls 07/21/2017  . Displaced fracture of proximal end of right fibula 07/21/2017  . Lower extremity weakness 05/28/2017  . Right lower quadrant abdominal pain 08/22/2016  . Abnormal TSH 01/10/2016  . Morbid obesity ([place]) 01/12/2014  . Routine general medical examination at a health care facility 10/28/2012  . GERD (gastroesophageal reflux disease) 10/28/2012  . Anxiety 12/20/2011  . Allergic rhinitis 12/20/2011  . Asthma 12/20/2011  . Lapband APL with HH repair 03/31/2011  . Uncontrolled type 2 diabetes mellitus with hyperglycemia, with long-term current use of insulin ([place]) 06/30/2007  . Hyperlipidemia 06/30/2007  . Essential hypertension 06/27/2007    Alanson Puls, PT DPT 10/23/2018, 12:13 PM  [place] MAIN [facility] SERVICES [address] [place], [place], [address] Phone: [phone]   Fax:  [phone]  Name: [name] MRN: [id number] Date of Birth:  08/09/1965

## 2018-10-24 NOTE — Therapy (Addendum)
[place] MAIN [facility] SERVICES [address] [place], [place], [address] Phone: [phone]   Fax:  [phone]  Physical Therapy Treatment     Patient Details  Name: [name] MRN: [id number] Date of Birth: 1965-05-25 Referring Provider (PT): Cornell Barman   Encounter Date: 10/21/2018    Past Medical History:  Diagnosis Date  . Allergy   . Amyotrophic lateral sclerosis (ALS) ([place]) 09/23/2018  . Anemia   . Anxiety   . Arthritis   . Asthma   . Colon polyp   . Complication of anesthesia   . Diabetes mellitus   . Edema   . Fatigue   . Hypertension   . IBS (irritable bowel syndrome)   . Lump in female breast   . Methicillin resistant Staphylococcus aureus in conditions classified elsewhere and of unspecified site   . Migraines   . Morbid obesity ([place])   . Night sweats   . Nonspecific abnormal results of thyroid function study   . Other B-complex deficiencies   . Paresthesias   . PONV (postoperative nausea and vomiting)   . Pure hypercholesterolemia   . Reflux   . Sacral fracture ([place])   . Sinus problem   . Symptomatic states associated with artificial menopause   . Thyroid disease   . Type II or unspecified type diabetes mellitus without mention of complication, not stated as uncontrolled   . Unspecified sleep apnea     Past Surgical History:  Procedure Laterality Date  . abcess removal  1997   MRSA  . ABDOMINAL HYSTERECTOMY  1997   complete in 1997, partial was in 1995  . carpal tunnel release r  07/03/13   Dalldorf  . [place]  . CHOLECYSTECTOMY  03/1989  . COLONOSCOPY  03/21/2011   Normal.  [facility].  [name] KNEE SURGERY  2006  . LAPAROSCOPIC GASTRIC BANDING  05/2009  . TONSILLECTOMY AND ADENOIDECTOMY  1974  . TUBAL LIGATION    . ULNAR TUNNEL RELEASE Right 02/05/2018   Procedure: CUBITAL TUNNEL RELEASE/DECOMPRESSION;  Surgeon: Melrose Nakayama, MD;  Location: [place];  Service: Orthopedics;   Laterality: Right;    There were no vitals filed for this visit.             Ther-ex  Standing hip abd with 2# ankle weight x 15 BLE and  BUE support  Standing hip extension with 2# ankle weight x 10 BLE with BUE support  Standing hip flexion marches with 2# ankle weight x 10 BLE with BUE support  LAQ with 2# ankle weights with 3 second holds x10 each LE  STS from chair with UE support 2x10  Mini squats x10 with no UE support  Toe taps to 6" step without UE support alternating LE x15 each    Patient required min VCs for correct technique.     Plan;    Patient demonstrates understanding of HEP with min corrections needed. Patient challenged closed chain and open chain exercises with multiple repetitions due to fatigue. Weak LE combined with weak core musculature results in fair postural control and  minimal balance deficits.          PT Education - 10/24/18 1729    Education provided  Yes    Education Details  HEP    Person(s) Educated  Patient    Methods  Explanation    Comprehension  Verbalized understanding       PT Short Term Goals -  10/23/18 1159      PT SHORT TERM GOAL #1   Title  Patient will be independent in home exercise program to improve strength/mobility for better functional independence with ADLs.    Time  6    Period  Weeks    Status  Partially Met    Target Date  09/10/18        PT Long Term Goals - 10/23/18 1159      PT LONG TERM GOAL #1   Title  Patient will increase 10 meter walk test to >.50 m/s as to improve gait speed for better community ambulation and to reduce fall risk.    Baseline  . 36 m/sec,, 10/23/18 =.43 m/ sec    Time  8    Period  Weeks    Status  Partially Met    Target Date  12/31/18      PT LONG TERM GOAL #2   Title  Patient will transfer sit to stand  from 19 inch mat without assist or anyone supporting AD  to demonstrate improved LE strength to decrease falls risk.     Time  12    Period  Weeks    Status   Achieved      PT LONG TERM GOAL #3   Title  Patient will demonstrate stance without AD  x 1 min to demonstrate decreased falls risk.     Baseline  10/23/18 =    Time  12    Period  Weeks    Status  Achieved      PT LONG TERM GOAL #4   Title  Patient will  ambulate 500 feet to work towards community ambulation distances.     Baseline  270 feet, 10/23/18= 305 feet    Time  12    Period  Weeks    Status  Partially Met    Target Date  12/31/18      PT LONG TERM GOAL #5   Title  Patient (< 87 years old) will complete five times sit to stand test in < 10 seconds indicating an increased LE strength and improved balance.    Baseline  20.28 m/sec. 10/23/18 =16.82 ( from green chair)    Time  12    Period  Weeks    Status  Partially Met    Target Date  12/31/18              Patient will benefit from skilled therapeutic intervention in order to improve the following deficits and impairments:  Decreased balance, Decreased endurance, Decreased mobility, Difficulty walking, Impaired sensation, Decreased range of motion, Decreased activity tolerance, Decreased coordination, Decreased strength, Pain, Postural dysfunction  Visit Diagnosis: 1. Muscle weakness (generalized)   2. Other lack of coordination   3. Difficulty in walking, not elsewhere classified   4. Pain in right hip   5. Pain in right ankle and joints of right foot   6. Weakness of right leg        Problem List Patient Active Problem List   Diagnosis Date Noted  . Migraine without status migrainosus, not intractable   . Chronic pain syndrome   . Lumbar disc disease with radiculopathy 07/21/2017  . Lumbar foraminal stenosis 07/21/2017  . Unable to walk 07/21/2017  . Multiple falls 07/21/2017  . Displaced fracture of proximal end of right fibula 07/21/2017  . Lower extremity weakness 05/28/2017  . Right lower quadrant abdominal pain 08/22/2016  . Abnormal TSH 01/10/2016  . Morbid  obesity ([place]) 01/12/2014  . Routine  general medical examination at a health care facility 10/28/2012  . GERD (gastroesophageal reflux disease) 10/28/2012  . Anxiety 12/20/2011  . Allergic rhinitis 12/20/2011  . Asthma 12/20/2011  . Lapband APL with HH repair 03/31/2011  . Uncontrolled type 2 diabetes mellitus with hyperglycemia, with long-term current use of insulin ([place]) 06/30/2007  . Hyperlipidemia 06/30/2007  . Essential hypertension 06/27/2007    Alanson Puls, PT DPT 10/24/2018, 5:34 PM  [place] MAIN [facility] SERVICES [address] [place], [place], [address] Phone: [phone]   Fax:  [phone]  Name: [name] MRN: [id number] Date of Birth: 1966-02-22

## 2018-10-28 ENCOUNTER — Encounter: Payer: Self-pay | Admitting: Physical Therapy

## 2018-10-28 ENCOUNTER — Other Ambulatory Visit: Payer: Self-pay

## 2018-10-28 ENCOUNTER — Ambulatory Visit: Payer: BC Managed Care – PPO | Admitting: Occupational Therapy

## 2018-10-28 ENCOUNTER — Encounter: Payer: Self-pay | Admitting: Occupational Therapy

## 2018-10-28 ENCOUNTER — Ambulatory Visit: Payer: BC Managed Care – PPO | Admitting: Physical Therapy

## 2018-10-28 DIAGNOSIS — M25551 Pain in right hip: Secondary | ICD-10-CM

## 2018-10-28 DIAGNOSIS — R278 Other lack of coordination: Secondary | ICD-10-CM

## 2018-10-28 DIAGNOSIS — M6281 Muscle weakness (generalized): Secondary | ICD-10-CM

## 2018-10-28 DIAGNOSIS — R262 Difficulty in walking, not elsewhere classified: Secondary | ICD-10-CM

## 2018-10-28 NOTE — Therapy (Signed)
[place] MAIN [facility] SERVICES [address] [place], [place], [address] Phone: [phone]   Fax:  [phone]  Physical Therapy Treatment  Patient Details  Name: [name] MRN: [id number] Date of Birth: February 20, 1966 Referring Provider (PT): Cornell Barman   Encounter Date: 10/28/2018  PT End of Session - 10/28/18 1212    Visit Number  22    Number of Visits  25    Date for PT Re-Evaluation  10/29/18    PT Start Time  8022    PT Stop Time  1230    PT Time Calculation (min)  45 min    Equipment Utilized During Treatment  Gait belt;Other (comment)   AFO   Activity Tolerance  Patient tolerated treatment well;Patient limited by fatigue    Behavior During Therapy  [facility] for tasks assessed/performed       Past Medical History:  Diagnosis Date  . Allergy   . Amyotrophic lateral sclerosis (ALS) ([place]) 09/23/2018  . Anemia   . Anxiety   . Arthritis   . Asthma   . Colon polyp   . Complication of anesthesia   . Diabetes mellitus   . Edema   . Fatigue   . Hypertension   . IBS (irritable bowel syndrome)   . Lump in female breast   . Methicillin resistant Staphylococcus aureus in conditions classified elsewhere and of unspecified site   . Migraines   . Morbid obesity ([place])   . Night sweats   . Nonspecific abnormal results of thyroid function study   . Other B-complex deficiencies   . Paresthesias   . PONV (postoperative nausea and vomiting)   . Pure hypercholesterolemia   . Reflux   . Sacral fracture ([place])   . Sinus problem   . Symptomatic states associated with artificial menopause   . Thyroid disease   . Type II or unspecified type diabetes mellitus without mention of complication, not stated as uncontrolled   . Unspecified sleep apnea     Past Surgical History:  Procedure Laterality Date  . abcess removal  1997   MRSA  . ABDOMINAL HYSTERECTOMY  1997   complete in 1997, partial was in 1995  . carpal tunnel release r   07/03/13   [facility]orf  . [place]  . CHOLECYSTECTOMY  03/1989  . COLONOSCOPY  03/21/2011   Normal.  [facility].  [name] KNEE SURGERY  2006  . LAPAROSCOPIC GASTRIC BANDING  05/2009  . TONSILLECTOMY AND ADENOIDECTOMY  1974  . TUBAL LIGATION    . ULNAR TUNNEL RELEASE Right 02/05/2018   Procedure: CUBITAL TUNNEL RELEASE/DECOMPRESSION;  Surgeon: Melrose Nakayama, MD;  Location: [facility];  Service: Orthopedics;  Laterality: Right;    There were no vitals filed for this visit.  Subjective Assessment - 10/28/18 1211    Subjective  Patient reports she has been doing well, her back is sore 2/10.    Patient is accompained by:  Family member    Pertinent History   Patient fx her sacrum 9/18 and  she lost sensation to right foot, and she lost ability to DF her right foot. Patient had back surgery 03/2017 , she was sent home. She was walking with no device indoors and she could get out of the house and do the steps. June 18, 2017  her LLE was getting weaker and the right leg was getting stronger. Patient had follow up with neurosurgery. She was scheduled for a steriod shot Jul 23, 2017, but on May 3 her legs didnt  feel right, she fell 4 times, she fractured her R fibula  on may 3rd, she went to hospital and spent one week. She went to SNF for 1 month. She had another neuro consult, resulting in kDx of  polyneuropathy. Then she had an apt with and [facility] said it was not polyneuropathy, (in [place])  She has a second consultation scheduled Dec 30 for a  Neuromuscular.consult.  She had a scan of the spine T 12-28-17  who then sent her to a thoracic surgeon at the end of November and was recommended to not due surgery. She does not know what her Dx is and she has been having home PT beginning August 25, 2017 . She has had HHPT 2 x  week for the last 5 months. She is able to walk 50 feet with RW. She is able to transfer from wc to stand independently.     How long can you stand comfortably?  5 mins    Patient  Stated Goals  to walk without the RW, or walk better, use the bathroom    Currently in Pain?  Yes    Pain Score  2     Pain Location  Back    Pain Onset  More than a month ago        Treatment: Standing hip abd x 10 x 2 YTB BLE Standing hip ext x 10 x 2 YTB BLE  Side stepping with YTB x 4 laps with UE support High marching x 10 x 2 with YTB  5 sit to stands from chair level to parallel bars with one ue on bars and one pushing up form the chair arm  Pt educated  about  posture and technique with exercises to improve exercise technique, movement at target joints, with min  verbal, visual, tactile cues.                       PT Education - 10/28/18 1211    Education provided  Yes    Education Details  HEP    Person(s) Educated  Patient    Methods  Explanation    Comprehension  Verbalized understanding       PT Short Term Goals - 10/23/18 1159      PT SHORT TERM GOAL #1   Title  Patient will be independent in home exercise program to improve strength/mobility for better functional independence with ADLs.    Time  6    Period  Weeks    Status  Partially Met    Target Date  09/10/18        PT Long Term Goals - 10/23/18 1159      PT LONG TERM GOAL #1   Title  Patient will increase 10 meter walk test to >.50 m/s as to improve gait speed for better community ambulation and to reduce fall risk.    Baseline  . 36 m/sec,, 10/23/18 =.43 m/ sec    Time  8    Period  Weeks    Status  Partially Met    Target Date  12/31/18      PT LONG TERM GOAL #2   Title  Patient will transfer sit to stand  from 19 inch mat without assist or anyone supporting AD  to demonstrate improved LE strength to decrease falls risk.     Time  12    Period  Weeks  Status  Achieved      PT LONG TERM GOAL #3   Title  Patient will demonstrate stance without AD  x 1 min to demonstrate decreased falls risk.     Baseline  10/23/18 =    Time  12    Period  Weeks    Status  Achieved       PT LONG TERM GOAL #4   Title  Patient will  ambulate 500 feet to work towards community ambulation distances.     Baseline  270 feet, 10/23/18= 305 feet    Time  12    Period  Weeks    Status  Partially Met    Target Date  12/31/18      PT LONG TERM GOAL #5   Title  Patient (< 54 years old) will complete five times sit to stand test in < 10 seconds indicating an increased LE strength and improved balance.    Baseline  20.28 m/sec. 10/23/18 =16.82 ( from green chair)    Time  12    Period  Weeks    Status  Partially Met    Target Date  12/31/18            Plan - 10/28/18 1213    Clinical Impression Statement  Patient demonstrates improved stability and strength allowing patient to perform short duration standing interventions with rest periods.  Patient performs beginning standing dynamic standing balance exercises to shift weight and perform single leg standing activities with mod assist. Patient fatigues quickly with exercises requiring rest breaks at this time. Patient will continue to benefit from skilled physical therapy to improve pain and mobility.    Rehab Potential  Good    Clinical Impairments Affecting Rehab Potential  Multiple tests concerning for neurologic involvement     PT Frequency  2x / week    PT Duration  12 weeks    PT Treatment/Interventions  Gait training;Therapeutic activities;Therapeutic exercise;Balance training;Neuromuscular re-education;Patient/family education;Orthotic Fit/Training;Manual techniques    PT Next Visit Plan  get updated feedback on pain after leg press    PT Home Exercise Plan  No updates this date     Consulted and Agree with Plan of Care  Patient       Patient will benefit from skilled therapeutic intervention in order to improve the following deficits and impairments:  Decreased balance, Decreased endurance, Decreased mobility, Difficulty walking, Impaired sensation, Decreased range of motion, Decreased activity tolerance, Decreased  coordination, Decreased strength, Pain, Postural dysfunction  Visit Diagnosis: 1. Muscle weakness (generalized)   2. Other lack of coordination   3. Difficulty in walking, not elsewhere classified   4. Pain in right hip        Problem List Patient Active Problem List   Diagnosis Date Noted  . Migraine without status migrainosus, not intractable   . Chronic pain syndrome   . Lumbar disc disease with radiculopathy 07/21/2017  . Lumbar foraminal stenosis 07/21/2017  . Unable to walk 07/21/2017  . Multiple falls 07/21/2017  . Displaced fracture of proximal end of right fibula 07/21/2017  . Lower extremity weakness 05/28/2017  . Right lower quadrant abdominal pain 08/22/2016  . Abnormal TSH 01/10/2016  . Morbid obesity ([place]) 01/12/2014  . Routine general medical examination at a health care facility 10/28/2012  . GERD (gastroesophageal reflux disease) 10/28/2012  . Anxiety 12/20/2011  . Allergic rhinitis 12/20/2011  . Asthma 12/20/2011  . Lapband APL with HH repair 03/31/2011  . Uncontrolled type 2 diabetes mellitus  with hyperglycemia, with long-term current use of insulin ([place]) 06/30/2007  . Hyperlipidemia 06/30/2007  . Essential hypertension 06/27/2007    Alanson Puls PT DPT 10/28/2018, 12:14 PM  [place] MAIN [facility] SERVICES [address] [place], [place], [address] Phone: [phone]   Fax:  [phone]  Name: [name] MRN: [id number] Date of Birth: 05/23/1965

## 2018-10-28 NOTE — Therapy (Addendum)
[place] MAIN [facility] SERVICES [address] [place], [place], [address] Phone: [phone]   Fax:  [phone]  Occupational Therapy Treatment  Patient Details  Name: [name] MRN: [id number] Date of Birth: 03/29/1965 Referring Provider (OT): Alma Friendly, MD   Encounter Date: 10/28/2018  OT End of Session - 10/28/18 1153    Visit Number  5    Number of Visits  24    Date for OT Re-Evaluation  12/17/18    OT Start Time  1100    OT Stop Time  1145    OT Time Calculation (min)  45 min    Activity Tolerance  Patient tolerated treatment well    Behavior During Therapy  [facility] for tasks assessed/performed       Past Medical History:  Diagnosis Date  . Allergy   . Amyotrophic lateral sclerosis (ALS) ([place]) 09/23/2018  . Anemia   . Anxiety   . Arthritis   . Asthma   . Colon polyp   . Complication of anesthesia   . Diabetes mellitus   . Edema   . Fatigue   . Hypertension   . IBS (irritable bowel syndrome)   . Lump in female breast   . Methicillin resistant Staphylococcus aureus in conditions classified elsewhere and of unspecified site   . Migraines   . Morbid obesity ([place])   . Night sweats   . Nonspecific abnormal results of thyroid function study   . Other B-complex deficiencies   . Paresthesias   . PONV (postoperative nausea and vomiting)   . Pure hypercholesterolemia   . Reflux   . Sacral fracture ([place])   . Sinus problem   . Symptomatic states associated with artificial menopause   . Thyroid disease   . Type II or unspecified type diabetes mellitus without mention of complication, not stated as uncontrolled   . Unspecified sleep apnea     Past Surgical History:  Procedure Laterality Date  . abcess removal  1997   MRSA  . ABDOMINAL HYSTERECTOMY  1997   complete in 1997, partial was in 1995  . carpal tunnel release r  07/03/13   [facility]orf  . [place]  . CHOLECYSTECTOMY  03/1989  .  COLONOSCOPY  03/21/2011   Normal.  [facility].  [name] KNEE SURGERY  2006  . LAPAROSCOPIC GASTRIC BANDING  05/2009  . TONSILLECTOMY AND ADENOIDECTOMY  1974  . TUBAL LIGATION    . ULNAR TUNNEL RELEASE Right 02/05/2018   Procedure: CUBITAL TUNNEL RELEASE/DECOMPRESSION;  Surgeon: Melrose Nakayama, MD;  Location: [facility];  Service: Orthopedics;  Laterality: Right;    There were no vitals filed for this visit.  Subjective Assessment - 10/28/18 1151    Subjective   Pt. reports that she has been feeling weaker lately.    Patient is accompanied by:  Family member    Pertinent History  [name] was recently diagnosed with ALS at [facility].  She has had a recent decrease in strength in L side of body, UE and LE with decreased coordination.  She feels like she is dropping cups more and needs to use both hands for hand to mouth and handwriting has decreased to almost illegible.    Patient Stated Goals  I want to work on handwriting, being able to hold a cup again and regain strength and coordination in both hands.    Currently in Pain?  No/denies      OT  TREATMENT    Neuro muscular re-education:  Pt. worked on bilateral [facility] skills grasping 1/2" small pegs with the tip of her digits, and placing them in a small pegboard. Pt. worked on translatory movements of the hand, moving the pegs through her hand to the tip of her 2nd digits, and thumb. Pt. had difficulty moving the pegs through her hand.  Therapeutic Exercise:  Pt. performed gross gripping with grip strengthener. Pt. worked on sustaining grip while grasping pegs and reaching at various heights. The gripper was placed in the 3rd resistive slot with the white resistive spring. Pt. dropped multiple pegs, and required the resistance to be decreased to the 2nd resistive slot. Pt. worked on pinch strengthening in the bilateral hand for lateral, and 3pt. pinch using yellow, red, and green resistive clips. Pt. worked on placing the clips at various  vertical and horizontal angles. Tactile and verbal cues were required for eliciting the desired movement. Pt. worked on the digiflex 1.5 lbs for bilateral hands.                            OT Education - 10/28/18 1153    Education Details  HEP    Person(s) Educated  Patient    Comprehension  Verbalized understanding;Returned demonstration          OT Long Term Goals - 09/24/18 1133      OT LONG TERM GOAL #1   Title  Pt will complete self feeding using adaptive utensils and compenstory techniques using R hand.    Baseline  Pt needs help with holding cup, cutting meat and uses L hand as assist.    Time  12    Period  Weeks    Status  New    Target Date  12/17/18      OT LONG TERM GOAL #2   Title  Pt will complete LB dressing using adaptive equipment with min assist and cues in unsupported sitting.    Baseline  Pt is dependent on her husband for socks, shoes, AFOs and pants over hips 50% of time.    Time  12    Period  Weeks    Status  New    Target Date  12/17/18      OT LONG TERM GOAL #3   Title  Pt will complete toileting and hygiene with adaptive aid with min assist and cues on elevated toilet with use of grab bar and FWW for balance.    Baseline  Pt unable to complete hygiene after BM; able to complete Independently after urinating.    Time  12    Period  Weeks    Status  New    Target Date  12/17/18      OT LONG TERM GOAL #4   Title  Pt will be educated in HEP for strengtening and fine motor coordination.    Baseline  Pt does not have any HEP for BUEs or hands.    Time  12    Period  Weeks    Status  New    Target Date  12/17/18      OT LONG TERM GOAL #5   Title  Pt will increase strength  in B hands for use during ADLs by 4#.    Time  12    Period  Weeks    Status  New    Target Date  12/17/18      Long Term  Additional Goals   Additional Long Term Goals  Yes      OT LONG TERM GOAL #6   Title  Pt will increase strength 2# in B pinch  for lateral and 3 point for use during ADLs.    Baseline  R 4# L 3#    Time  12    Period  Weeks    Status  New    Target Date  12/17/18            Plan - 10/28/18 1153    Clinical Impression Statement  Pt. reports that her husband is planning a trip to the beach to fish. Pt. reported being upset that she can not join him. Pt. continues to present BUE muscle weakness, and impaired bilateral [facility] skills. Pt. continues to work on improving [facility] skills in order to improve overall ADL, and IADL functioning.    OT Occupational Profile and History  Detailed Assessment- Review of Records and additional review of physical, cognitive, psychosocial history related to current functional performance    Occupational performance deficits (Please refer to evaluation for details):  ADL's;IADL's;Education;Leisure    Body Structure / Function / Physical Skills  Strength;Gait;FMC;IADL;Balance;ADL;Coordination;Endurance;UE functional use;Mobility    Rehab Potential  Good    OT Frequency  2x / week    OT Duration  12 weeks    OT Treatment/Interventions  Moist Heat;Self-care/ADL training;Therapeutic exercise;Patient/family education;Energy conservation;Therapist, nutritional;Therapeutic activities;Balance training;DME and/or AE instruction;Manual Therapy;Psychosocial skills training    Consulted and Agree with Plan of Care  Patient       Patient will benefit from skilled therapeutic intervention in order to improve the following deficits and impairments:   Body Structure / Function / Physical Skills: Strength, Gait, FMC, IADL, Balance, ADL, Coordination, Endurance, UE functional use, Mobility       Visit Diagnosis: 1. Muscle weakness (generalized)   2. Other lack of coordination       Problem List Patient Active Problem List   Diagnosis Date Noted  . Migraine without status migrainosus, not intractable   . Chronic pain syndrome   . Lumbar disc disease with radiculopathy 07/21/2017  .  Lumbar foraminal stenosis 07/21/2017  . Unable to walk 07/21/2017  . Multiple falls 07/21/2017  . Displaced fracture of proximal end of right fibula 07/21/2017  . Lower extremity weakness 05/28/2017  . Right lower quadrant abdominal pain 08/22/2016  . Abnormal TSH 01/10/2016  . Morbid obesity ([place]) 01/12/2014  . Routine general medical examination at a health care facility 10/28/2012  . GERD (gastroesophageal reflux disease) 10/28/2012  . Anxiety 12/20/2011  . Allergic rhinitis 12/20/2011  . Asthma 12/20/2011  . Lapband APL with HH repair 03/31/2011  . Uncontrolled type 2 diabetes mellitus with hyperglycemia, with long-term current use of insulin ([place]) 06/30/2007  . Hyperlipidemia 06/30/2007  . Essential hypertension 06/27/2007    Harrel Carina, MS, OTR/L 10/28/2018, 12:00 PM  [place] MAIN [facility] SERVICES [address] [place], [place], [address] Phone: [phone]   Fax:  [phone]  Name: [name] MRN: [id number] Date of Birth: [birth year]

## 2018-10-29 ENCOUNTER — Encounter: Payer: Self-pay | Admitting: Primary Care

## 2018-10-29 ENCOUNTER — Other Ambulatory Visit: Payer: Self-pay

## 2018-10-29 ENCOUNTER — Ambulatory Visit: Payer: BC Managed Care – PPO | Admitting: Primary Care

## 2018-10-29 VITALS — BP 126/80 | HR 92 | Temp 98.0°F

## 2018-10-29 DIAGNOSIS — E782 Mixed hyperlipidemia: Secondary | ICD-10-CM

## 2018-10-29 DIAGNOSIS — Z794 Long term (current) use of insulin: Secondary | ICD-10-CM

## 2018-10-29 DIAGNOSIS — R7989 Other specified abnormal findings of blood chemistry: Secondary | ICD-10-CM | POA: Diagnosis not present

## 2018-10-29 DIAGNOSIS — F419 Anxiety disorder, unspecified: Secondary | ICD-10-CM

## 2018-10-29 DIAGNOSIS — E559 Vitamin D deficiency, unspecified: Secondary | ICD-10-CM | POA: Diagnosis not present

## 2018-10-29 DIAGNOSIS — E1165 Type 2 diabetes mellitus with hyperglycemia: Secondary | ICD-10-CM

## 2018-10-29 DIAGNOSIS — E1140 Type 2 diabetes mellitus with diabetic neuropathy, unspecified: Secondary | ICD-10-CM

## 2018-10-29 DIAGNOSIS — I10 Essential (primary) hypertension: Secondary | ICD-10-CM | POA: Diagnosis not present

## 2018-10-29 DIAGNOSIS — J309 Allergic rhinitis, unspecified: Secondary | ICD-10-CM

## 2018-10-29 DIAGNOSIS — R29898 Other symptoms and signs involving the musculoskeletal system: Secondary | ICD-10-CM

## 2018-10-29 LAB — POCT GLYCOSYLATED HEMOGLOBIN (HGB A1C): Hemoglobin A1C: 9.2 % — AB (ref 4.0–5.6)

## 2018-10-29 LAB — LIPID PANEL
Cholesterol: 163 mg/dL (ref 0–200)
HDL: 47.9 mg/dL (ref >39.00)
NonHDL: 114.83
Total CHOL/HDL Ratio: 3
Triglycerides: 260 mg/dL — ABNORMAL HIGH (ref 0.0–149.0)
VLDL: 52 mg/dL — ABNORMAL HIGH (ref 0.0–40.0)

## 2018-10-29 LAB — CBC
HCT: 42.1 % (ref 36.0–46.0)
Hemoglobin: 13.7 g/dL (ref 12.0–15.0)
MCHC: 32.5 g/dL (ref 30.0–36.0)
MCV: 89.1 fl (ref 78.0–100.0)
Platelets: 269 10*3/uL (ref 150.0–400.0)
RBC: 4.73 Mil/uL (ref 3.87–5.11)
RDW: 14 % (ref 11.5–15.5)
WBC: 6.4 10*3/uL (ref 4.0–10.5)

## 2018-10-29 LAB — BASIC METABOLIC PANEL
BUN: 14 mg/dL (ref 6–23)
CO2: 30 mEq/L (ref 19–32)
Calcium: 9.1 mg/dL (ref 8.4–10.5)
Chloride: 99 mEq/L (ref 96–112)
Creatinine, Ser: 0.8 mg/dL (ref 0.40–1.20)
GFR: 74.65 mL/min (ref >60.00)
Glucose, Bld: 184 mg/dL — ABNORMAL HIGH (ref 70–99)
Potassium: 4.2 mEq/L (ref 3.5–5.1)
Sodium: 136 mEq/L (ref 135–145)

## 2018-10-29 LAB — LDL CHOLESTEROL, DIRECT: Direct LDL: 81 mg/dL

## 2018-10-29 LAB — TSH: TSH: 4.87 u[IU]/mL — ABNORMAL HIGH (ref 0.35–4.50)

## 2018-10-29 MED ORDER — VITAMIN D (ERGOCALCIFEROL) 1.25 MG (50000 UNIT) PO CAPS: ORAL_CAPSULE | ORAL | 0 refills | Status: DC

## 2018-10-29 NOTE — Progress Notes (Signed)
Subjective:    Patient ID: [name], female    DOB: Oct 10, 1965, 53 y.o.   MRN: [id number]  HPI  [name] is a 53 year old female who presents today for general follow up of chronic conditions.   BP Readings from Last 3 Encounters:  10/29/18 126/80  02/27/18 130/80  02/05/18 118/64   Continues to struggle with chronic lower extremity weakness, muscle loss to hands, numbness/tingling to upper and lower extremities. She will be establishing with the ALS clinic through [facility] soon, appointment scheduled for September 2020. She is following with occupational therapy in the outpatient setting, first visit was last week. She is doing well on gabapentin 600 mg BID which helps to reduce neuropathy symptoms, also cyclobenzaprine 10 mg BID-TID for muscle spasms.   Blood sugars have been "high". Current medications for diabetes include: Levmir 40 units BID, Novolog 20 units TID, glipizide ER 10 mg, Trulicity 1.5 mg weekly.  She is checking her blood glucose 1 times daily and is getting readings of:  AM fasting: 212, 120, 250 Before lunch: 250-300  Last A1C: 9.4 in April 2020, 9.2 today. Last Eye Exam: Due this year, no recent exam Last Foot Exam:  Due in December 2020 Pneumonia Vaccination: Completed in 2018 ACE/ARB: losartan  Statin: atorvastatin   Underwent lab work in late July pre neurology in [place], patient has not heard back in regards to her results but did view them on her My Chart portal and noticed PTH was high and vitamin D was low.    Review of Systems  Constitutional: Negative for unexpected weight change.  Eyes: Negative for visual disturbance.  Respiratory: Negative for shortness of breath.   Cardiovascular: Negative for chest pain.  Musculoskeletal: Positive for arthralgias and myalgias.  Neurological: Positive for weakness and numbness.  Psychiatric/Behavioral:       Feels overall well on fluoxetine, denies SI/HI.       Past Medical History:   Diagnosis Date  . Allergy   . Amyotrophic lateral sclerosis (ALS) ([place]) 09/23/2018  . Anemia   . Anxiety   . Arthritis   . Asthma   . Colon polyp   . Complication of anesthesia   . Diabetes mellitus   . Edema   . Fatigue   . Hypertension   . IBS (irritable bowel syndrome)   . Lump in female breast   . Methicillin resistant Staphylococcus aureus in conditions classified elsewhere and of unspecified site   . Migraines   . Morbid obesity ([place])   . Night sweats   . Nonspecific abnormal results of thyroid function study   . Other B-complex deficiencies   . Paresthesias   . PONV (postoperative nausea and vomiting)   . Pure hypercholesterolemia   . Reflux   . Sacral fracture ([place])   . Sinus problem   . Symptomatic states associated with artificial menopause   . Thyroid disease   . Type II or unspecified type diabetes mellitus without mention of complication, not stated as uncontrolled   . Unspecified sleep apnea      Social History   Socioeconomic History  . Marital status: Married    Spouse name: Not on file  . Number of children: 3  . Years of education: college  . Highest education level: Not on file  Occupational History  . Occupation: Data processing manager: [employer]  . Financial resource strain: Not on file  . Food insecurity  Worry: Not on file    Inability: Not on file  . Transportation needs    Medical: Not on file    Non-medical: Not on file  Tobacco Use  . Smoking status: Never Smoker  . Smokeless tobacco: Never Used  Substance and Sexual Activity  . Alcohol use: Yes    Alcohol/week: 0.0 standard drinks    Comment: rarely - once per week at most-nothing for the past year 01/31/18  . Drug use: No  . Sexual activity: Never    Birth control/protection: None  Lifestyle  . Physical activity    Days per week: Not on file    Minutes per session: Not on file  . Stress: Not on file  Relationships  . Social Herbalist  on phone: Not on file    Gets together: Not on file    Attends religious service: Not on file    Active member of club or organization: Not on file    Attends meetings of clubs or organizations: Not on file    Relationship status: Not on file  . Intimate partner violence    Fear of current or ex partner: Not on file    Emotionally abused: Not on file    Physically abused: Not on file    Forced sexual activity: Not on file  Other Topics Concern  . Not on file  Social History Narrative   Exercise: walking two days per week, 30 minutes.       Married x 29 years, happily married; no abuse.       Children: 3 children; 1 grandchild; two step grandchildren; 1 gg.      Lives: with husband, mother-in-law.      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      Tobacco: never      Alcohol: socially.  One glass of wine per month.      Exercise:  Walking around campus.        Seatbelt: 100%      Guns: gun safe in garage.    Past Surgical History:  Procedure Laterality Date  . abcess removal  1997   MRSA  . ABDOMINAL HYSTERECTOMY  1997   complete in 1997, partial was in 1995  . carpal tunnel release r  07/03/13   [facility]al[facility]orf  . [place]  . CHOLECYSTECTOMY  03/1989  . COLONOSCOPY  03/21/2011   Normal.  [facility].  [name] KNEE SURGERY  2006  . LAPAROSCOPIC GASTRIC BANDING  05/2009  . TONSILLECTOMY AND ADENOIDECTOMY  1974  . TUBAL LIGATION    . ULNAR TUNNEL RELEASE Right 02/05/2018   Procedure: CUBITAL TUNNEL RELEASE/DECOMPRESSION;  Surgeon: Melrose Nakayama, MD;  Location: [facility];  Service: Orthopedics;  Laterality: Right;    Family History  Problem Relation Age of Onset  . Diabetes Mother   . Heart disease Mother 92       CHF, CAD  . Other Mother        muscle disease  . Hypertension Mother   . Hyperlipidemia Mother   . Arthritis Mother        OA hip L s/p THR  . Diabetes Father   . Heart disease Father        AMI/CABG/valve replacement age 65.  .  Stroke Father   . Diabetes Brother   . Heart disease Brother        stent age 61.  . Fibromyalgia Daughter   .  Crohn's disease Daughter   . Hypertension Son   . Alzheimer's disease Maternal Grandmother   . Emphysema Maternal Grandfather   . Heart disease Paternal Grandmother   . Breast cancer Other        2 Aunts    Allergies  Allergen Reactions  . Amoxicillin Shortness Of Breath and Rash  . Bee Venom Anaphylaxis  . Levofloxacin Shortness Of Breath and Rash  . Meloxicam Hives and Rash  . Vancomycin Anaphylaxis  . Sulfa Antibiotics Rash  . Ace Inhibitors Cough  . Codeine   . Meperidine Hcl   . Oxycodone-Acetaminophen   . Penicillins Swelling and Rash    Has patient had a PCN reaction causing immediate rash, facial/tongue/throat swelling, SOB or lightheadedness with hypotension: Yes Has patient had a PCN reaction causing severe rash involving mucus membranes or skin necrosis: Yes Has patient had a PCN reaction that required hospitalization: No Has patient had a PCN reaction occurring within the last 10 years: No If all of the above answers are "NO", then may proceed with Cephalosporin use.   Flo Shanks [Gatifloxacin] Rash    Current Outpatient Medications on File Prior to Visit  Medication Sig Dispense Refill  . acetaminophen (TYLENOL) 650 MG CR tablet Take 650 mg by mouth at bedtime.    [name] albuterol (VENTOLIN HFA) 108 (90 Base) MCG/ACT inhaler INHALE 1-2 PUFFS INTO THE LUNGS EVERY 6 (SIX) HOURS AS NEEDED FOR WHEEZING OR SHORTNESS OF BREATH. 18 g 0  . atorvastatin (LIPITOR) 40 MG tablet TAKE 1 TABLET BY MOUTH EVERYDAY AT BEDTIME 90 tablet 0  . Blood Glucose Monitoring Suppl (ONE TOUCH ULTRA MINI) w/Device KIT Use as instructed to test blood sugar 5 times daily. 1 each 0  . Continuous Blood Gluc Sensor (FREESTYLE LIBRE 14 DAY SENSOR) MISC 1 each by Does not apply route every 14 (fourteen) days. 2 each 0  . cyclobenzaprine (FLEXERIL) 10 MG tablet TAKE 1 TABLET BY MOUTH THREE TIMES  A DAY AS NEEDED FOR MUSCLE SPASMS 270 tablet 1  . Dulaglutide (TRULICITY) 1.5 ST/4.1DQ SOPN Inject into the skin once weekly for diabetes. 8 mL 3  . FLUoxetine (PROZAC) 40 MG capsule TAKE 1 CAPSULE BY MOUTH EVERY DAY 90 capsule 1  . fluticasone (FLONASE) 50 MCG/ACT nasal spray Place 2 sprays into both nostrils daily. 16 g 11  . gabapentin (NEURONTIN) 300 MG capsule Take 2 capsules (600 mg total) by mouth 2 (two) times daily. 360 capsule 1  . glucose blood test strip ONE TOUCH ULTRA MINI Use as instructed to test blood sugar 5 times daily. 200 each 5  . hydrochlorothiazide (HYDRODIURIL) 25 MG tablet TAKE 1/2 TABLET (12.5 MG) BY MOUTH EVERY DAY. 45 tablet 1  . Insulin Detemir (LEVEMIR FLEXTOUCH) 100 UNIT/ML Pen Inject 40 Units into the skin 2 (two) times daily. 45 mL 2  . Insulin Pen Needle 32G X 4 MM MISC USE AS DIRECTED FOR INSULIN INJECTION DAILY 100 each 5  . ketoconazole (NIZORAL) 2 % cream Apply topically once daily as needed for irritation. 60 g 0  . Lancets (ONETOUCH ULTRASOFT) lancets Use as instructed to test blood sugar 3 times daily 100 each 5  . levocetirizine (XYZAL) 5 MG tablet TAKE ONE TABLET BY MOUTH EVERY EVENING (Patient taking differently: TAKE ONE TABLET (5 mg)  BY MOUTH EVERY EVENING) 90 tablet 1  . losartan (COZAAR) 100 MG tablet Take 0.5 tablets (50 mg total) by mouth daily. 90 tablet 1  . metoprolol tartrate (LOPRESSOR) 25 MG tablet  Take 0.5 tablets (12.5 mg total) by mouth 2 (two) times daily. For blood pressure. 90 tablet 1  . Multiple Vitamin (MULTIVITAMIN PO) Take 1 tablet by mouth daily.     [name] NOVOLOG FLEXPEN 100 UNIT/ML FlexPen INJECT 18 UNITS INTO THE SKIN 3 (THREE) TIMES DAILY WITH MEALS. 15 mL 2  . ondansetron (ZOFRAN-ODT) 4 MG disintegrating tablet TAKE 1 TABLET BY MOUTH EVERY 8 HOURS AS NEEDED FOR NAUSEA AS DIRECTED. (Patient taking differently: Take 4 mg by mouth every 8 (eight) hours as needed for nausea or vomiting. TAKE 1 TABLET BY MOUTH EVERY 8 HOURS AS NEEDED  FOR NAUSEA AS DIRECTED.) 20 tablet 0  . VALERIAN ROOT PO Take 1,000 mg by mouth at bedtime.     No current facility-administered medications on file prior to visit.     BP 126/80   Pulse 92   Temp 98 F (36.7 C) (Temporal)   SpO2 96%    Objective:   Physical Exam  Constitutional: She appears well-nourished.  Neck: Neck supple.  Cardiovascular: Normal rate and regular rhythm.  Respiratory: Effort normal and breath sounds normal.  Skin: Skin is warm and dry.  Psychiatric: She has a normal mood and affect.           Assessment & Plan:

## 2018-10-29 NOTE — Assessment & Plan Note (Signed)
Chronic, stable on current regimen. Continue same.

## 2018-10-29 NOTE — Assessment & Plan Note (Signed)
Repeat lipids pending, continue atorvastatin.

## 2018-10-29 NOTE — Assessment & Plan Note (Signed)
Repeat TSH pending

## 2018-10-29 NOTE — Patient Instructions (Addendum)
Stop by the lab prior to leaving today. I will notify you of your results once received.   Stop Glipizide ER 10 mg daily for diabetes.  We've increased your Levemir to 45 units twice daily. Continue Novolog 20 units three times daily before meals.  Schedule a virtual visit with me for diabetes check for 6 weeks.  It was a pleasure to see you today!

## 2018-10-29 NOTE — Assessment & Plan Note (Signed)
Level of 7 noted on recent labs, PTH high.  Will treat vitamin D with 50,000 unit capsules once weekly for at least 3 months, then repeat both vitamin D and PTH as PTH may be high due to low D.

## 2018-10-29 NOTE — Assessment & Plan Note (Addendum)
Continues to remain uncontrolled.  Patient only checking glucose levels once daily despite strong recommendations for additional checks. This also means that she is injecting fast acting insulin without knowledge of her glucose readings.   A1C today of 9.2 which is about the same as April 2020 which is discouraging. Managed on statin and ARB. Foot exam due in December 2020.   Again, stressed the importance of checking glucose levels more frequently, especially given high and frequent doses of insulin. Also strongly advised she schedule an eye exam.   Increase Levemir to 45 units BID. Continue Novolog 20 units TID with meals. Stop glipizide. Continue Trulicity.   We will plan to see her back virtually in 6 weeks with glucose logs. Paper logs provided today.

## 2018-10-29 NOTE — Assessment & Plan Note (Signed)
Stable in the office today, continue losartan, metoprolol tartrate, HCTZ. BMP pending.

## 2018-10-29 NOTE — Assessment & Plan Note (Signed)
Overall stable on fluoxetine, continue same.

## 2018-10-29 NOTE — Assessment & Plan Note (Signed)
Scheduled to follow with the ALS clinic through [facility]. It does seem that neurology isn't clear on this diagnosis based off of chart review from [place].   Labs reviewed from recent neurology encounter, will treat vitamin D and then consider repeating both vitamin D and PTH in 2 months.

## 2018-10-30 ENCOUNTER — Encounter: Payer: Self-pay | Admitting: Physical Therapy

## 2018-10-30 ENCOUNTER — Encounter: Payer: Self-pay | Admitting: Occupational Therapy

## 2018-10-30 ENCOUNTER — Ambulatory Visit: Payer: BC Managed Care – PPO | Admitting: Physical Therapy

## 2018-10-30 ENCOUNTER — Ambulatory Visit: Payer: BC Managed Care – PPO | Admitting: Occupational Therapy

## 2018-10-30 DIAGNOSIS — M6281 Muscle weakness (generalized): Secondary | ICD-10-CM

## 2018-10-30 DIAGNOSIS — R278 Other lack of coordination: Secondary | ICD-10-CM

## 2018-10-30 DIAGNOSIS — R29898 Other symptoms and signs involving the musculoskeletal system: Secondary | ICD-10-CM

## 2018-10-30 DIAGNOSIS — M25571 Pain in right ankle and joints of right foot: Secondary | ICD-10-CM

## 2018-10-30 DIAGNOSIS — R262 Difficulty in walking, not elsewhere classified: Secondary | ICD-10-CM

## 2018-10-30 DIAGNOSIS — M25551 Pain in right hip: Secondary | ICD-10-CM

## 2018-10-30 NOTE — Therapy (Signed)
[place] MAIN [facility] SERVICES [address] [place], [place], [address] Phone: [phone]   Fax:  [phone]  Physical Therapy Treatment  Patient Details  Name: [name] MRN: [id number] Date of Birth: 11-25-1965 Referring Provider (PT): Cornell Barman   Encounter Date: 10/30/2018  PT End of Session - 10/30/18 1136    Visit Number  23    Number of Visits  25    Date for PT Re-Evaluation  10/29/18    PT Start Time  3254    PT Stop Time  1230    PT Time Calculation (min)  45 min    Equipment Utilized During Treatment  Gait belt;Other (comment)   AFO   Activity Tolerance  Patient tolerated treatment well;Patient limited by fatigue    Behavior During Therapy  [facility] for tasks assessed/performed       Past Medical History:  Diagnosis Date  . Allergy   . Amyotrophic lateral sclerosis (ALS) ([place]) 09/23/2018  . Anemia   . Anxiety   . Arthritis   . Asthma   . Colon polyp   . Complication of anesthesia   . Diabetes mellitus   . Edema   . Fatigue   . Hypertension   . IBS (irritable bowel syndrome)   . Lump in female breast   . Methicillin resistant Staphylococcus aureus in conditions classified elsewhere and of unspecified site   . Migraines   . Morbid obesity ([place])   . Night sweats   . Nonspecific abnormal results of thyroid function study   . Other B-complex deficiencies   . Paresthesias   . PONV (postoperative nausea and vomiting)   . Pure hypercholesterolemia   . Reflux   . Sacral fracture ([place])   . Sinus problem   . Symptomatic states associated with artificial menopause   . Thyroid disease   . Type II or unspecified type diabetes mellitus without mention of complication, not stated as uncontrolled   . Unspecified sleep apnea     Past Surgical History:  Procedure Laterality Date  . abcess removal  1997   MRSA  . ABDOMINAL HYSTERECTOMY  1997   complete in 1997, partial was in 1995  . carpal tunnel release r   07/03/13   [facility]  . [place]  . CHOLECYSTECTOMY  03/1989  . COLONOSCOPY  03/21/2011   Normal.  [facility].  [name] KNEE SURGERY  2006  . LAPAROSCOPIC GASTRIC BANDING  05/2009  . TONSILLECTOMY AND ADENOIDECTOMY  1974  . TUBAL LIGATION    . ULNAR TUNNEL RELEASE Right 02/05/2018   Procedure: CUBITAL TUNNEL RELEASE/DECOMPRESSION;  Surgeon: Melrose Nakayama, MD;  Location: [facility];  Service: Orthopedics;  Laterality: Right;    There were no vitals filed for this visit.  Subjective Assessment - 10/30/18 1204    Subjective  Patient reports she has been doing well, her back is sore 2/10.    Patient is accompained by:  Family member    Pertinent History   Patient fx her sacrum 9/18 and  she lost sensation to right foot, and she lost ability to DF her right foot. Patient had back surgery 03/2017 , she was sent home. She was walking with no device indoors and she could get out of the house and do the steps. June 18, 2017  her LLE was getting weaker and the right leg was getting stronger. Patient had follow up with neurosurgery. She was scheduled for a steriod shot Jul 23, 2017, but on May 3 her legs didnt  feel right, she fell 4 times, she fractured her R fibula  on may 3rd, she went to hospital and spent one week. She went to SNF for 1 month. She had another neuro consult, resulting in kDx of  polyneuropathy. Then she had an apt with and [facility] said it was not polyneuropathy, (in [place])  She has a second consultation scheduled Dec 30 for a  Neuromuscular.consult.  She had a scan of the spine T 12-28-17  who then sent her to a thoracic surgeon at the end of November and was recommended to not due surgery. She does not know what her Dx is and she has been having home PT beginning August 25, 2017 . She has had HHPT 2 x  week for the last 5 months. She is able to walk 50 feet with RW. She is able to transfer from wc to stand independently.     How long can you stand comfortably?  5 mins    Patient  Stated Goals  to walk without the RW, or walk better, use the bathroom    Currently in Pain?  Yes    Pain Score  2     Pain Location  Back    Pain Orientation  Lower    Pain Descriptors / Indicators  Aching    Pain Type  Chronic pain    Pain Onset  More than a month ago    Pain Frequency  Intermittent    Aggravating Factors   unknown    Pain Relieving Factors  unknown    Effect of Pain on Daily Activities  living with it.       Treatment:  Nu-step x 5 mins L 4  Marching BLE , 2 #LLE, 3 lbs RLEhooklying x 20   SAQ with 4 lbs RLE,3lbs LLE x 20 with 5 sec hold  Hooklying marching with 4 lbs RLE,3lbs LLE x 15x 2 sets and assist with LLE  Bridges x 10 x 2 sets  Hamstring stretchingwith DF of B anklesx 30 sec x 3 sets BLE  BLE DF stretch x 30 sec x 3 sets BLE  Added stretching to HEP: DF, hamstring bilaterally  Pt educated throughout session about proper posture and technique with exercises. Improved exercise technique, movement at target joints, use of target muscles after min to mod verbal, visual, tactile cues                        PT Education - 10/30/18 1136    Education provided  Yes    Education Details  HEP    Person(s) Educated  Patient    Methods  Explanation    Comprehension  Verbalized understanding;Verbal cues required;Need further instruction       PT Short Term Goals - 10/23/18 1159      PT SHORT TERM GOAL #1   Title  Patient will be independent in home exercise program to improve strength/mobility for better functional independence with ADLs.    Time  6    Period  Weeks    Status  Partially Met    Target Date  09/10/18        PT Long Term Goals - 10/23/18 1159      PT LONG TERM GOAL #1   Title  Patient will increase 10 meter walk test to >.50 m/s as to improve gait speed for better community ambulation and to reduce fall  risk.    Baseline  . 36 m/sec,, 10/23/18 =.43 m/ sec    Time  8    Period  Weeks     Status  Partially Met    Target Date  12/31/18      PT LONG TERM GOAL #2   Title  Patient will transfer sit to stand  from 19 inch mat without assist or anyone supporting AD  to demonstrate improved LE strength to decrease falls risk.     Time  12    Period  Weeks    Status  Achieved      PT LONG TERM GOAL #3   Title  Patient will demonstrate stance without AD  x 1 min to demonstrate decreased falls risk.     Baseline  10/23/18 =    Time  12    Period  Weeks    Status  Achieved      PT LONG TERM GOAL #4   Title  Patient will  ambulate 500 feet to work towards community ambulation distances.     Baseline  270 feet, 10/23/18= 305 feet    Time  12    Period  Weeks    Status  Partially Met    Target Date  12/31/18      PT LONG TERM GOAL #5   Title  Patient (< 38 years old) will complete five times sit to stand test in < 10 seconds indicating an increased LE strength and improved balance.    Baseline  20.28 m/sec. 10/23/18 =16.82 ( from green chair)    Time  12    Period  Weeks    Status  Partially Met    Target Date  12/31/18            Plan - 10/30/18 1137    Clinical Impression Statement  Patient demonstrates decreased gait speed with deviations and B short leg braces and requires verbal and tactile cueing to maintain center of gravity during ambulation.  Patient also requires CGA during all dynamic standing balance activities. Patient requires consistent cueing to maintain correct position during gait activities. Patient demonstrates difficulty with gait and increased postural sway while on rollator.  Patient will continue to benefit from skilled therapy in order to improve dynamic standing balance and increase endurance    Rehab Potential  Good    Clinical Impairments Affecting Rehab Potential  Multiple tests concerning for neurologic involvement     PT Frequency  2x / week    PT Duration  12 weeks    PT Treatment/Interventions  Gait training;Therapeutic  activities;Therapeutic exercise;Balance training;Neuromuscular re-education;Patient/family education;Orthotic Fit/Training;Manual techniques    PT Next Visit Plan  get updated feedback on pain after leg press    PT Home Exercise Plan  No updates this date     Consulted and Agree with Plan of Care  Patient       Patient will benefit from skilled therapeutic intervention in order to improve the following deficits and impairments:  Decreased balance, Decreased endurance, Decreased mobility, Difficulty walking, Impaired sensation, Decreased range of motion, Decreased activity tolerance, Decreased coordination, Decreased strength, Pain, Postural dysfunction  Visit Diagnosis: 1. Muscle weakness (generalized)   2. Other lack of coordination   3. Difficulty in walking, not elsewhere classified   4. Pain in right hip   5. Pain in right ankle and joints of right foot   6. Weakness of right leg        Problem List Patient Active  Problem List   Diagnosis Date Noted  . Vitamin D deficiency 10/29/2018  . Migraine without status migrainosus, not intractable   . Chronic pain syndrome   . Lumbar disc disease with radiculopathy 07/21/2017  . Lumbar foraminal stenosis 07/21/2017  . Unable to walk 07/21/2017  . Multiple falls 07/21/2017  . Displaced fracture of proximal end of right fibula 07/21/2017  . Lower extremity weakness 05/28/2017  . Right lower quadrant abdominal pain 08/22/2016  . Abnormal TSH 01/10/2016  . Morbid obesity ([place]) 01/12/2014  . Routine general medical examination at a health care facility 10/28/2012  . GERD (gastroesophageal reflux disease) 10/28/2012  . Anxiety 12/20/2011  . Allergic rhinitis 12/20/2011  . Asthma 12/20/2011  . Lapband APL with HH repair 03/31/2011  . Uncontrolled type 2 diabetes mellitus with hyperglycemia, with long-term current use of insulin ([place]) 06/30/2007  . Hyperlipidemia 06/30/2007  . Essential hypertension 06/27/2007    Alanson Puls, [place] DPT 10/30/2018, 12:05 PM  [place] MAIN [facility] SERVICES [address] [place], [place], [address] Phone: [phone]   Fax:  [phone]  Name: JACKEE GLASNER MRN: [id number] Date of Birth: May 17, 1965

## 2018-10-30 NOTE — Therapy (Signed)
[place] MAIN [facility] SERVICES [address] [place], [place], [address] Phone: [phone]   Fax:  [phone]  Occupational Therapy Treatment  Patient Details  Name: [name] MRN: [id number] Date of Birth: 17-May-1965 Referring Provider (OT): Maria Friendly, MD   Encounter Date: 10/30/2018  OT End of Session - 10/30/18 1209    Visit Number  6    Number of Visits  24    Date for OT Re-Evaluation  12/17/18    Authorization Time Period  09/24/18-12/17/18    OT Start Time  1105    OT Stop Time  1145    OT Time Calculation (min)  40 min    Activity Tolerance  Patient tolerated treatment well    Behavior During Therapy  [facility] for tasks assessed/performed       Past Medical History:  Diagnosis Date  . Allergy   . Amyotrophic lateral sclerosis (ALS) ([place]) 09/23/2018  . Anemia   . Anxiety   . Arthritis   . Asthma   . Colon polyp   . Complication of anesthesia   . Diabetes mellitus   . Edema   . Fatigue   . Hypertension   . IBS (irritable bowel syndrome)   . Lump in female breast   . Methicillin resistant Staphylococcus aureus in conditions classified elsewhere and of unspecified site   . Migraines   . Morbid obesity ([place])   . Night sweats   . Nonspecific abnormal results of thyroid function study   . Other B-complex deficiencies   . Paresthesias   . PONV (postoperative nausea and vomiting)   . Pure hypercholesterolemia   . Reflux   . Sacral fracture ([place])   . Sinus problem   . Symptomatic states associated with artificial menopause   . Thyroid disease   . Type II or unspecified type diabetes mellitus without mention of complication, not stated as uncontrolled   . Unspecified sleep apnea     Past Surgical History:  Procedure Laterality Date  . abcess removal  1997   MRSA  . ABDOMINAL HYSTERECTOMY  1997   complete in 1997, partial was in 1995  . carpal tunnel release r  07/03/13   Dal[place]orf  . [place]  . CHOLECYSTECTOMY  03/1989  . COLONOSCOPY  03/21/2011   Normal.  [facility].  [name] KNEE SURGERY  2006  . LAPAROSCOPIC GASTRIC BANDING  05/2009  . TONSILLECTOMY AND ADENOIDECTOMY  1974  . TUBAL LIGATION    . ULNAR TUNNEL RELEASE Right 02/05/2018   Procedure: CUBITAL TUNNEL RELEASE/DECOMPRESSION;  Surgeon: Maria Nakayama, MD;  Location: [place];  Service: Orthopedics;  Laterality: Right;    There were no vitals filed for this visit.  Subjective Assessment - 10/30/18 1208    Subjective   Pt. reports that she has been feeling weak lately.    Patient is accompanied by:  Family member    Pertinent History  [name] was recently diagnosed with ALS at [place] [facility].  She has had a recent decrease in strength in L side of body, UE and LE with decreased coordination.  She feels like she is dropping cups more and needs to use both hands for hand to mouth and handwriting has decreased to almost illegible.    Limitations  weakness    Currently in Pain?  No/denies      OT TREATMENT    Neuro muscular re-education:  Pt. worked on grasping, and manipulating  1/2" pegs with her right hand, and worked on translatory skills moving the pegs through her hand to the tip of her 2nd digit, and thumb to prepare for placing them into a small pegboard.  Therapeutic Exercise:  Pt. performed gross gripping with grip strengthener. Pt. worked on sustaining grip while grasping pegs and reaching at various heights. The gripper was placed in the 2nd resistive slot with the white resistive spring. Pt. worked on grasping 1" resistive cubes alternating thumb opposition to the tip of the 2nd , and 3rd digits while the board is placed at a vertical angle. Pt. worked on pressing the cubes back into place while alternating isolated 2nd, and 3rd, 4th, and 5th digit extension. Pt. worked on digit flexion using the DigiFlex with 1.5# resistance. Pt. required cues to reposition the DigiFlex in her hand. Pt. continues to present  with limited grip, and pinch strength.                        OT Education - 10/30/18 1209    Education Details  HEP    Person(s) Educated  Patient    Methods  Explanation;Demonstration;Verbal cues    Comprehension  Verbalized understanding;Returned demonstration          OT Long Term Goals - 09/24/18 1133      OT LONG TERM GOAL #1   Title  Pt will complete self feeding using adaptive utensils and compenstory techniques using R hand.    Baseline  Pt needs help with holding cup, cutting meat and uses L hand as assist.    Time  12    Period  Weeks    Status  New    Target Date  12/17/18      OT LONG TERM GOAL #2   Title  Pt will complete LB dressing using adaptive equipment with min assist and cues in unsupported sitting.    Baseline  Pt is dependent on her husband for socks, shoes, AFOs and pants over hips 50% of time.    Time  12    Period  Weeks    Status  New    Target Date  12/17/18      OT LONG TERM GOAL #3   Title  Pt will complete toileting and hygiene with adaptive aid with min assist and cues on elevated toilet with use of grab bar and FWW for balance.    Baseline  Pt unable to complete hygiene after BM; able to complete Independently after urinating.    Time  12    Period  Weeks    Status  New    Target Date  12/17/18      OT LONG TERM GOAL #4   Title  Pt will be educated in HEP for strengtening and fine motor coordination.    Baseline  Pt does not have any HEP for BUEs or hands.    Time  12    Period  Weeks    Status  New    Target Date  12/17/18      OT LONG TERM GOAL #5   Title  Pt will increase strength  in B hands for use during ADLs by 4#.    Time  12    Period  Weeks    Status  New    Target Date  12/17/18      Long Term Additional Goals   Additional Long Term Goals  Yes  OT LONG TERM GOAL #6   Title  Pt will increase strength 2# in B pinch for lateral and 3 point for use during ADLs.    Baseline  R 4# L 3#    Time   12    Period  Weeks    Status  New    Target Date  12/17/18            Plan - 10/30/18 1209    Clinical Impression Statement  Pt. reports that she is realizing that her UE her hand is weak, and there are some things that she used to be able to do with her hands that she is unable to to do now. Pt. continues to work on improving BUE strength, and [facility] skills in order to improve overall ADL, and IADL functioning.    OT Occupational Profile and History  Detailed Assessment- Review of Records and additional review of physical, cognitive, psychosocial history related to current functional performance    Occupational performance deficits (Please refer to evaluation for details):  ADL's;IADL's;Education;Leisure    Body Structure / Function / Physical Skills  Strength;Gait;FMC;IADL;Balance;ADL;Coordination;Endurance;UE functional use;Mobility    Rehab Potential  Good    Clinical Decision Making  Several treatment options, min-mod task modification necessary    Comorbidities Affecting Occupational Performance:  Presence of comorbidities impacting occupational performance    OT Frequency  2x / week    OT Duration  12 weeks    OT Treatment/Interventions  Moist Heat;Self-care/ADL training;Therapeutic exercise;Patient/family education;Energy conservation;Therapist, nutritional;Therapeutic activities;Balance training;DME and/or AE instruction;Manual Therapy;Psychosocial skills training    Consulted and Agree with Plan of Care  Patient       Patient will benefit from skilled therapeutic intervention in order to improve the following deficits and impairments:   Body Structure / Function / Physical Skills: Strength, Gait, FMC, IADL, Balance, ADL, Coordination, Endurance, UE functional use, Mobility       Visit Diagnosis: 1. Muscle weakness (generalized)   2. Other lack of coordination       Problem List Patient Active Problem List   Diagnosis Date Noted  . Vitamin D deficiency  10/29/2018  . Migraine without status migrainosus, not intractable   . Chronic pain syndrome   . Lumbar disc disease with radiculopathy 07/21/2017  . Lumbar foraminal stenosis 07/21/2017  . Unable to walk 07/21/2017  . Multiple falls 07/21/2017  . Displaced fracture of proximal end of right fibula 07/21/2017  . Lower extremity weakness 05/28/2017  . Right lower quadrant abdominal pain 08/22/2016  . Abnormal TSH 01/10/2016  . Morbid obesity ([place]) 01/12/2014  . Routine general medical examination at a health care facility 10/28/2012  . GERD (gastroesophageal reflux disease) 10/28/2012  . Anxiety 12/20/2011  . Allergic rhinitis 12/20/2011  . Asthma 12/20/2011  . Lapband APL with HH repair 03/31/2011  . Uncontrolled type 2 diabetes mellitus with hyperglycemia, with long-term current use of insulin ([place]) 06/30/2007  . Hyperlipidemia 06/30/2007  . Essential hypertension 06/27/2007    Harrel Carina, MS, OTR/L 10/30/2018, 12:16 PM  [place] MAIN [facility] SERVICES [address] [place], [place], [address] Phone: [phone]   Fax:  [phone]  Name: [name] MRN: [id number] Date of Birth: 1965-10-18

## 2018-11-04 ENCOUNTER — Other Ambulatory Visit: Payer: Self-pay

## 2018-11-04 ENCOUNTER — Encounter: Payer: Self-pay | Admitting: Physical Therapy

## 2018-11-04 ENCOUNTER — Encounter: Payer: Self-pay | Admitting: Occupational Therapy

## 2018-11-04 ENCOUNTER — Ambulatory Visit: Payer: BC Managed Care – PPO | Admitting: Physical Therapy

## 2018-11-04 ENCOUNTER — Ambulatory Visit: Payer: BC Managed Care – PPO | Admitting: Occupational Therapy

## 2018-11-04 DIAGNOSIS — M25571 Pain in right ankle and joints of right foot: Secondary | ICD-10-CM

## 2018-11-04 DIAGNOSIS — M6281 Muscle weakness (generalized): Secondary | ICD-10-CM

## 2018-11-04 DIAGNOSIS — R278 Other lack of coordination: Secondary | ICD-10-CM

## 2018-11-04 DIAGNOSIS — R29898 Other symptoms and signs involving the musculoskeletal system: Secondary | ICD-10-CM

## 2018-11-04 DIAGNOSIS — R262 Difficulty in walking, not elsewhere classified: Secondary | ICD-10-CM

## 2018-11-04 DIAGNOSIS — M25551 Pain in right hip: Secondary | ICD-10-CM

## 2018-11-04 NOTE — Therapy (Signed)
[place] MAIN [facility] SERVICES [address] [place], [place], [address] Phone: [phone]   Fax:  [phone]  Physical Therapy Treatment  Patient Details  Name: [name] MRN: [id number] Date of Birth: December 22, 1965 Referring Provider (PT): Cornell Barman   Encounter Date: 11/04/2018  PT End of Session - 11/04/18 1505    Visit Number  24    Number of Visits  25    Date for PT Re-Evaluation  10/29/18    Equipment Utilized During Treatment  Gait belt;Other (comment)   AFO   Activity Tolerance  Patient tolerated treatment well;Patient limited by fatigue    Behavior During Therapy  [facility] for tasks assessed/performed       Past Medical History:  Diagnosis Date  . Allergy   . Amyotrophic lateral sclerosis (ALS) ([place]) 09/23/2018  . Anemia   . Anxiety   . Arthritis   . Asthma   . Colon polyp   . Complication of anesthesia   . Diabetes mellitus   . Edema   . Fatigue   . Hypertension   . IBS (irritable bowel syndrome)   . Lump in female breast   . Methicillin resistant Staphylococcus aureus in conditions classified elsewhere and of unspecified site   . Migraines   . Morbid obesity ([place])   . Night sweats   . Nonspecific abnormal results of thyroid function study   . Other B-complex deficiencies   . Paresthesias   . PONV (postoperative nausea and vomiting)   . Pure hypercholesterolemia   . Reflux   . Sacral fracture ([place])   . Sinus problem   . Symptomatic states associated with artificial menopause   . Thyroid disease   . Type II or unspecified type diabetes mellitus without mention of complication, not stated as uncontrolled   . Unspecified sleep apnea     Past Surgical History:  Procedure Laterality Date  . abcess removal  1997   MRSA  . ABDOMINAL HYSTERECTOMY  1997   complete in 1997, partial was in 1995  . carpal tunnel release r  07/03/13   [place]al[place]orf  . [place]  . CHOLECYSTECTOMY  03/1989  .  COLONOSCOPY  03/21/2011   Normal.  [facility].  [name] KNEE SURGERY  2006  . LAPAROSCOPIC GASTRIC BANDING  05/2009  . TONSILLECTOMY AND ADENOIDECTOMY  1974  . TUBAL LIGATION    . ULNAR TUNNEL RELEASE Right 02/05/2018   Procedure: CUBITAL TUNNEL RELEASE/DECOMPRESSION;  Surgeon: [place] Nakayama, MD;  Location: [place];  Service: Orthopedics;  Laterality: Right;    There were no vitals filed for this visit.  Subjective Assessment - 11/04/18 1502    Subjective  Patient reports she has been doing well, her left knee had an episode of pain during sitting on the commode and she had to move her left leg flex and extend for a while and then it stopped hurting.    Patient is accompained by:  Family member    Pertinent History   Patient fx her sacrum 9/18 and  she lost sensation to right foot, and she lost ability to DF her right foot. Patient had back surgery 03/2017 , she was sent home. She was walking with no device indoors and she could get out of the house and do the steps. June 18, 2017  her LLE was getting weaker and the right leg was getting stronger. Patient had follow up with neurosurgery. She was scheduled for a steriod shot  Jul 23, 2017, but on May 3 her legs didnt  feel right, she fell 4 times, she fractured her R fibula  on may 3rd, she went to hospital and spent one week. She went to SNF for 1 month. She had another neuro consult, resulting in kDx of  polyneuropathy. Then she had an apt with and [facility] said it was not polyneuropathy, (in [place])  She has a second consultation scheduled Dec 30 for a  Neuromuscular.consult.  She had a scan of the spine T 12-28-17  who then sent her to a thoracic surgeon at the end of November and was recommended to not due surgery. She does not know what her Dx is and she has been having home PT beginning August 25, 2017 . She has had HHPT 2 x  week for the last 5 months. She is able to walk 50 feet with RW. She is able to transfer from wc to stand independently.     How long can  you stand comfortably?  5 mins    Patient Stated Goals  to walk without the RW, or walk better, use the bathroom    Currently in Pain?  Yes    Pain Score  2     Pain Location  Knee    Pain Orientation  Left    Pain Descriptors / Indicators  Aching    Pain Type  Acute pain    Pain Onset  In the past 7 days    Pain Frequency  Intermittent    Aggravating Factors   NA    Pain Relieving Factors  movement    Effect of Pain on Daily Activities  hard to stand    Multiple Pain Sites  No       Treatment:  [place] to left knee 1. 3 cm squared ,constant x 8 mins to left patella and quad insertion   Marching BLE , 2 #LLE, 3 lbs RLEhooklying x 20   SAQ with 4 lbs RLE,3lbs LLE x 20 with 5 sec hold  Hooklying marching with 4 lbs RLE,3lbs LLE x 15x 2 sets and assist with LLE  Bridges x 10 x 2 sets  Hamstring stretchingwith DF of B anklesx 30 sec x 3 sets BLE  BLE DF stretch x 30 sec x 3 sets BLE  Added stretching to HEP: DF, hamstring bilaterally  Pt educated throughout session about proper posture and technique with exercises. Improved exercise technique, movement at target joints, use of target muscles after min to mod verbal, visual, tactile cues                      PT Education - 11/04/18 1505    Education provided  Yes    Education Details  HEP    Person(s) Educated  Patient    Methods  Explanation    Comprehension  Verbalized understanding       PT Short Term Goals - 10/23/18 1159      PT SHORT TERM GOAL #1   Title  Patient will be independent in home exercise program to improve strength/mobility for better functional independence with ADLs.    Time  6    Period  Weeks    Status  Partially Met    Target Date  09/10/18        PT Long Term Goals - 11/04/18 1513      PT LONG TERM GOAL #1   Title  Patient will increase 10 meter walk test  to >.50 m/s as to improve gait speed for better community ambulation and to reduce fall risk.     Baseline  . 36 m/sec,, 10/23/18 =.43 m/ sec    Time  8    Period  Weeks    Status  Partially Met    Target Date  12/31/18      PT LONG TERM GOAL #2   Title  Patient will transfer sit to stand  from 19 inch mat without assist or anyone supporting AD  to demonstrate improved LE strength to decrease falls risk.     Time  12    Period  Weeks    Status  Achieved      PT LONG TERM GOAL #3   Title  Patient will demonstrate stance without AD  x 1 min to demonstrate decreased falls risk.     Baseline  10/23/18 =    Time  12    Period  Weeks    Status  Achieved    Target Date  12/31/18      PT LONG TERM GOAL #4   Title  Patient will  ambulate 500 feet to work towards community ambulation distances.     Baseline  270 feet, 10/23/18= 305 feet    Time  12    Period  Weeks    Status  Partially Met    Target Date  12/31/18      PT LONG TERM GOAL #5   Title  Patient (< 75 years old) will complete five times sit to stand test in < 10 seconds indicating an increased LE strength and improved balance.    Baseline  20.28 m/sec. 10/23/18 =16.82 ( from green chair)    Time  12    Period  Weeks    Status  Partially Met    Target Date  12/31/18      Additional Long Term Goals   Additional Long Term Goals  Yes      PT LONG TERM GOAL #6   Title  Patient will report a worst pain of 3/10 on VAS in left knee             to improve tolerance with ADLs and reduced symptoms with activities.    Time  12    Period  Weeks    Status  New    Target Date  12/31/18            Plan - 11/04/18 1505    Clinical Impression Statement  Patient has some left knee pain today that gets better with movement. She perfomrs BLE supine exercises with assist for correct positions and techniques. She transfers wc <> mat with MI. She will continue to benefit from skilled PT to improve strength and mobility.    Rehab Potential  Good    Clinical Impairments Affecting Rehab Potential  Multiple tests concerning for neurologic  involvement     PT Frequency  2x / week    PT Duration  12 weeks    PT Treatment/Interventions  Gait training;Therapeutic activities;Therapeutic exercise;Balance training;Neuromuscular re-education;Patient/family education;Orthotic Fit/Training;Manual techniques;Ultrasound;Moist Heat    PT Next Visit Plan  get updated feedback on pain after leg press    PT Home Exercise Plan  No updates this date     Consulted and Agree with Plan of Care  Patient       Patient will benefit from skilled therapeutic intervention in order to improve the following deficits and impairments:  Decreased balance, Decreased  endurance, Decreased mobility, Difficulty walking, Impaired sensation, Decreased range of motion, Decreased activity tolerance, Decreased coordination, Decreased strength, Pain, Postural dysfunction  Visit Diagnosis: 1. Muscle weakness (generalized)   2. Other lack of coordination   3. Difficulty in walking, not elsewhere classified   4. Pain in right hip   5. Pain in right ankle and joints of right foot   6. Weakness of right leg        Problem List Patient Active Problem List   Diagnosis Date Noted  . Vitamin D deficiency 10/29/2018  . Migraine without status migrainosus, not intractable   . Chronic pain syndrome   . Lumbar disc disease with radiculopathy 07/21/2017  . Lumbar foraminal stenosis 07/21/2017  . Unable to walk 07/21/2017  . Multiple falls 07/21/2017  . Displaced fracture of proximal end of right fibula 07/21/2017  . Lower extremity weakness 05/28/2017  . Right lower quadrant abdominal pain 08/22/2016  . Abnormal TSH 01/10/2016  . Morbid obesity ([place]) 01/12/2014  . Routine general medical examination at a health care facility 10/28/2012  . GERD (gastroesophageal reflux disease) 10/28/2012  . Anxiety 12/20/2011  . Allergic rhinitis 12/20/2011  . Asthma 12/20/2011  . Lapband APL with HH repair 03/31/2011  . Uncontrolled type 2 diabetes mellitus with hyperglycemia,  with long-term current use of insulin ([place]) 06/30/2007  . Hyperlipidemia 06/30/2007  . Essential hypertension 06/27/2007    Alanson Puls, [place] DPT 11/04/2018, 3:16 PM  [place] MAIN [facility] SERVICES [address] [place], [place], [address] Phone: [phone]   Fax:  [phone]  Name: [name] MRN: [id number] Date of Birth: 1965-05-01

## 2018-11-04 NOTE — Therapy (Addendum)
[place] MAIN [facility] SERVICES [address] [place], [place], [address] Phone: [phone]   Fax:  [phone]  Occupational Therapy Treatment  Patient Details  Name: [name] MRN: [id number] Date of Birth: 09-Nov-1965 Referring Provider (OT): Alma Friendly, MD   Encounter Date: 11/04/2018  OT End of Session - 11/04/18 1214    Visit Number  7    Number of Visits  24    Date for OT Re-Evaluation  12/17/18    OT Start Time  1100    OT Stop Time  1145    OT Time Calculation (min)  45 min    Activity Tolerance  Patient tolerated treatment well    Behavior During Therapy  [facility] for tasks assessed/performed       Past Medical History:  Diagnosis Date  . Allergy   . Amyotrophic lateral sclerosis (ALS) ([place]) 09/23/2018  . Anemia   . Anxiety   . Arthritis   . Asthma   . Colon polyp   . Complication of anesthesia   . Diabetes mellitus   . Edema   . Fatigue   . Hypertension   . IBS (irritable bowel syndrome)   . Lump in female breast   . Methicillin resistant Staphylococcus aureus in conditions classified elsewhere and of unspecified site   . Migraines   . Morbid obesity ([place])   . Night sweats   . Nonspecific abnormal results of thyroid function study   . Other B-complex deficiencies   . Paresthesias   . PONV (postoperative nausea and vomiting)   . Pure hypercholesterolemia   . Reflux   . Sacral fracture ([place])   . Sinus problem   . Symptomatic states associated with artificial menopause   . Thyroid disease   . Type II or unspecified type diabetes mellitus without mention of complication, not stated as uncontrolled   . Unspecified sleep apnea     Past Surgical History:  Procedure Laterality Date  . abcess removal  1997   MRSA  . ABDOMINAL HYSTERECTOMY  1997   complete in 1997, partial was in 1995  . carpal tunnel release r  07/03/13   Dal[place]orf  . [place]  . CHOLECYSTECTOMY  03/1989  .  COLONOSCOPY  03/21/2011   Normal.  [facility].  [name] KNEE SURGERY  2006  . LAPAROSCOPIC GASTRIC BANDING  05/2009  . TONSILLECTOMY AND ADENOIDECTOMY  1974  . TUBAL LIGATION    . ULNAR TUNNEL RELEASE Right 02/05/2018   Procedure: CUBITAL TUNNEL RELEASE/DECOMPRESSION;  Surgeon: Melrose Nakayama, MD;  Location: [facility];  Service: Orthopedics;  Laterality: Right;    There were no vitals filed for this visit.  Subjective Assessment - 11/04/18 1213    Subjective   Pt. reports that she finished her forms for filing for disability.    Patient is accompanied by:  Family member    Pertinent History  [name] was recently diagnosed with ALS at [facility].  She has had a recent decrease in strength in L side of body, UE and LE with decreased coordination.  She feels like she is dropping cups more and needs to use both hands for hand to mouth and handwriting has decreased to almost illegible.    Patient Stated Goals  I want to work on handwriting, being able to hold a cup again and regain strength and coordination in both hands.    Currently in Pain?  No/denies    Pain  Score  2     Pain Location  Back    Pain Descriptors / Indicators  Aching    Pain Type  Chronic pain      OT TREATMENT    Neuro muscular re-education:  Pt. worked on grasping, and manipulating mini clips. Pt. worked on placing the mini clips onto onto an elevated surface. Pt. worked on untying knots on a thick rope using bilateral hand coordination. Pt. Had difficulty untying knots on medium thickness rope.   Therapeutic Exercise:  Pt. worked on grasping single wide resistive clips, performing 2pt. Pinch with 2nd digit, followed by 3rd digit. Pt. worked on placing them onto an elevated horizontal dowel. Pt. Required cues for hand, and finger position when placing them onto the horizontal dowel. Pt. worked on gross gripping with green band resistance on the hand grip strengthener. Pt. worked on thumb opposition with the 2nd, 3rd, 4th,  and 5th digits with emphasis on digit form when grasping 1" foam. Pt. worked on moving red foam tubing proximal to distal.                       OT Education - 11/04/18 1214    Education Details  HEP    Person(s) Educated  Patient    Methods  Explanation;Demonstration;Verbal cues    Comprehension  Verbalized understanding;Returned demonstration          OT Long Term Goals - 09/24/18 1133      OT LONG TERM GOAL #1   Title  Pt will complete self feeding using adaptive utensils and compenstory techniques using R hand.    Baseline  Pt needs help with holding cup, cutting meat and uses L hand as assist.    Time  12    Period  Weeks    Status  New    Target Date  12/17/18      OT LONG TERM GOAL #2   Title  Pt will complete LB dressing using adaptive equipment with min assist and cues in unsupported sitting.    Baseline  Pt is dependent on her husband for socks, shoes, AFOs and pants over hips 50% of time.    Time  12    Period  Weeks    Status  New    Target Date  12/17/18      OT LONG TERM GOAL #3   Title  Pt will complete toileting and hygiene with adaptive aid with min assist and cues on elevated toilet with use of grab bar and FWW for balance.    Baseline  Pt unable to complete hygiene after BM; able to complete Independently after urinating.    Time  12    Period  Weeks    Status  New    Target Date  12/17/18      OT LONG TERM GOAL #4   Title  Pt will be educated in HEP for strengtening and fine motor coordination.    Baseline  Pt does not have any HEP for BUEs or hands.    Time  12    Period  Weeks    Status  New    Target Date  12/17/18      OT LONG TERM GOAL #5   Title  Pt will increase strength  in B hands for use during ADLs by 4#.    Time  12    Period  Weeks    Status  New    Target Date  12/17/18      Long Term Additional Goals   Additional Long Term Goals  Yes      OT LONG TERM GOAL #6   Title  Pt will increase strength 2# in B  pinch for lateral and 3 point for use during ADLs.    Baseline  R 4# L 3#    Time  12    Period  Weeks    Status  New    Target Date  12/17/18            Plan - 11/04/18 1215    Clinical Impression Statement Pt. has finished her forms for filing for disability this weekend. Pt. Reports that she has finally done it after waiting 2 years to complete it. Pt. reports having had a quiet weekend because her husband went on a fishing trip. Pt. presents  with bilateral hand, and digit weakness. Pt. continues to work on improving BUE strength, and [facility] skills in order to improve daily ADL, and IADL functioning.    OT Occupational Profile and History  Detailed Assessment- Review of Records and additional review of physical, cognitive, psychosocial history related to current functional performance    Occupational performance deficits (Please refer to evaluation for details):  ADL's;IADL's;Education;Leisure    Body Structure / Function / Physical Skills  Strength;Gait;FMC;IADL;Balance;ADL;Coordination;Endurance;UE functional use;Mobility    Rehab Potential  Good    Clinical Decision Making  Several treatment options, min-mod task modification necessary    Comorbidities Affecting Occupational Performance:  Presence of comorbidities impacting occupational performance    Comorbidities impacting occupational performance description:  progressive muscle weakness, balance, coordination    Modification or Assistance to Complete Evaluation   Min-Moderate modification of tasks or assist with assess necessary to complete eval    OT Frequency  2x / week    OT Duration  12 weeks    OT Treatment/Interventions  Moist Heat;Self-care/ADL training;Therapeutic exercise;Patient/family education;Energy conservation;Therapist, nutritional;Therapeutic activities;Balance training;DME and/or AE instruction;Manual Therapy;Psychosocial skills training    OT Home Exercise Plan  Pt given medium/red theraputty for home  strengthening exercises    Consulted and Agree with Plan of Care  Patient       Patient will benefit from skilled therapeutic intervention in order to improve the following deficits and impairments:   Body Structure / Function / Physical Skills: Strength, Gait, FMC, IADL, Balance, ADL, Coordination, Endurance, UE functional use, Mobility       Visit Diagnosis: 1. Muscle weakness (generalized)   2. Other lack of coordination       Problem List Patient Active Problem List   Diagnosis Date Noted  . Vitamin D deficiency 10/29/2018  . Migraine without status migrainosus, not intractable   . Chronic pain syndrome   . Lumbar disc disease with radiculopathy 07/21/2017  . Lumbar foraminal stenosis 07/21/2017  . Unable to walk 07/21/2017  . Multiple falls 07/21/2017  . Displaced fracture of proximal end of right fibula 07/21/2017  . Lower extremity weakness 05/28/2017  . Right lower quadrant abdominal pain 08/22/2016  . Abnormal TSH 01/10/2016  . Morbid obesity ([place]) 01/12/2014  . Routine general medical examination at a health care facility 10/28/2012  . GERD (gastroesophageal reflux disease) 10/28/2012  . Anxiety 12/20/2011  . Allergic rhinitis 12/20/2011  . Asthma 12/20/2011  . Lapband APL with HH repair 03/31/2011  . Uncontrolled type 2 diabetes mellitus with hyperglycemia, with long-term current use of insulin ([place]) 06/30/2007  . Hyperlipidemia 06/30/2007  . Essential hypertension 06/27/2007  Harrel Carina, MS, OTR/L 11/04/2018, 12:19 PM  [place] MAIN [facility] SERVICES [address] [place], [place], [address] Phone: [phone]   Fax:  [phone]  Name: SARIN COMUNALE MRN: [id number] Date of Birth: 1965/07/28

## 2018-11-06 ENCOUNTER — Encounter: Payer: Self-pay | Admitting: Occupational Therapy

## 2018-11-06 ENCOUNTER — Other Ambulatory Visit: Payer: Self-pay

## 2018-11-06 ENCOUNTER — Ambulatory Visit: Payer: BC Managed Care – PPO | Admitting: Occupational Therapy

## 2018-11-06 ENCOUNTER — Encounter: Payer: Self-pay | Admitting: Physical Therapy

## 2018-11-06 ENCOUNTER — Ambulatory Visit: Payer: BC Managed Care – PPO | Admitting: Physical Therapy

## 2018-11-06 DIAGNOSIS — M6281 Muscle weakness (generalized): Secondary | ICD-10-CM | POA: Diagnosis not present

## 2018-11-06 DIAGNOSIS — R262 Difficulty in walking, not elsewhere classified: Secondary | ICD-10-CM

## 2018-11-06 DIAGNOSIS — R278 Other lack of coordination: Secondary | ICD-10-CM

## 2018-11-06 DIAGNOSIS — M25551 Pain in right hip: Secondary | ICD-10-CM

## 2018-11-06 DIAGNOSIS — M25571 Pain in right ankle and joints of right foot: Secondary | ICD-10-CM

## 2018-11-06 DIAGNOSIS — R29898 Other symptoms and signs involving the musculoskeletal system: Secondary | ICD-10-CM

## 2018-11-06 NOTE — Therapy (Signed)
[place] MAIN [facility] SERVICES [address] [place], [place], [address] Phone: [phone]   Fax:  [phone]  Physical Therapy Treatment  Patient Details  Name: [name] MRN: [id number] Date of Birth: 1965-08-04 Referring Provider (PT): Cornell Barman   Encounter Date: 11/06/2018    Past Medical History:  Diagnosis Date  . Allergy   . Amyotrophic lateral sclerosis (ALS) ([place]) 09/23/2018  . Anemia   . Anxiety   . Arthritis   . Asthma   . Colon polyp   . Complication of anesthesia   . Diabetes mellitus   . Edema   . Fatigue   . Hypertension   . IBS (irritable bowel syndrome)   . Lump in female breast   . Methicillin resistant Staphylococcus aureus in conditions classified elsewhere and of unspecified site   . Migraines   . Morbid obesity ([place])   . Night sweats   . Nonspecific abnormal results of thyroid function study   . Other B-complex deficiencies   . Paresthesias   . PONV (postoperative nausea and vomiting)   . Pure hypercholesterolemia   . Reflux   . Sacral fracture ([place])   . Sinus problem   . Symptomatic states associated with artificial menopause   . Thyroid disease   . Type II or unspecified type diabetes mellitus without mention of complication, not stated as uncontrolled   . Unspecified sleep apnea     Past Surgical History:  Procedure Laterality Date  . abcess removal  1997   MRSA  . ABDOMINAL HYSTERECTOMY  1997   complete in 1997, partial was in 1995  . carpal tunnel release r  07/03/13   [place]orf  . [place]  . CHOLECYSTECTOMY  03/1989  . COLONOSCOPY  03/21/2011   Normal.  [facility].  [name] KNEE SURGERY  2006  . LAPAROSCOPIC GASTRIC BANDING  05/2009  . TONSILLECTOMY AND ADENOIDECTOMY  1974  . TUBAL LIGATION    . ULNAR TUNNEL RELEASE Right 02/05/2018   Procedure: CUBITAL TUNNEL RELEASE/DECOMPRESSION;  Surgeon: Melrose Nakayama, MD;  Location: [facility];  Service: Orthopedics;   Laterality: Right;    There were no vitals filed for this visit.      Treatment:  Leg press : 100 lbs BLE x 20 x 3 sets with assist RLE to keep hip in neutral, L knee to keep hips from abd Gait in parallel bars including side stepping single UE assist , backwards walking 2 Ue assist , and fwd stepping 1 Ue assist x 4 lengths of parallel bars  to encourage weight shifting in small spaces  Transfers sit to stand from various surface heights with min assist to steady the chair / wc for safety: Patient is able to perform sit to stand with one arm on chair arm and one arm on RW with better fwd trunk movement and steadier initial stand                        PT Education - 11/06/18 1132    Education provided  Yes    Education Details  HEP    Person(s) Educated  Patient    Methods  Explanation;Demonstration;Verbal cues    Comprehension  Verbalized understanding;Returned demonstration;Need further instruction       PT Short Term Goals - 10/23/18 1159      PT SHORT TERM GOAL #1   Title  Patient will be independent in home exercise program  to improve strength/mobility for better functional independence with ADLs.    Time  6    Period  Weeks    Status  Partially Met    Target Date  09/10/18        PT Long Term Goals - 11/04/18 1513      PT LONG TERM GOAL #1   Title  Patient will increase 10 meter walk test to >.50 m/s as to improve gait speed for better community ambulation and to reduce fall risk.    Baseline  . 36 m/sec,, 10/23/18 =.43 m/ sec    Time  8    Period  Weeks    Status  Partially Met    Target Date  12/31/18      PT LONG TERM GOAL #2   Title  Patient will transfer sit to stand  from 19 inch mat without assist or anyone supporting AD  to demonstrate improved LE strength to decrease falls risk.     Time  12    Period  Weeks    Status  Achieved      PT LONG TERM GOAL #3   Title  Patient will demonstrate stance without AD  x 1 min to demonstrate  decreased falls risk.     Baseline  10/23/18 =    Time  12    Period  Weeks    Status  Achieved    Target Date  12/31/18      PT LONG TERM GOAL #4   Title  Patient will  ambulate 500 feet to work towards community ambulation distances.     Baseline  270 feet, 10/23/18= 305 feet    Time  12    Period  Weeks    Status  Partially Met    Target Date  12/31/18      PT LONG TERM GOAL #5   Title  Patient (< 43 years old) will complete five times sit to stand test in < 10 seconds indicating an increased LE strength and improved balance.    Baseline  20.28 m/sec. 10/23/18 =16.82 ( from green chair)    Time  12    Period  Weeks    Status  Partially Met    Target Date  12/31/18      Additional Long Term Goals   Additional Long Term Goals  Yes      PT LONG TERM GOAL #6   Title  Patient will report a worst pain of 3/10 on VAS in left knee             to improve tolerance with ADLs and reduced symptoms with activities.    Time  12    Period  Weeks    Status  New    Target Date  12/31/18            Plan - 11/06/18 1132    Clinical Impression Statement  Patient needs UE support with RW for gait and B short leg braces. She performs transfer training and gait training in parallel bars for improving balance and weight shift. She will continue to benefit from skilled PT to improve mobility.    Rehab Potential  Good    Clinical Impairments Affecting Rehab Potential  Multiple tests concerning for neurologic involvement     PT Frequency  2x / week    PT Duration  12 weeks    PT Treatment/Interventions  Gait training;Therapeutic activities;Therapeutic exercise;Balance training;Neuromuscular re-education;Patient/family education;Orthotic Fit/Training;Manual techniques  PT Next Visit Plan  get updated feedback on pain after leg press    PT Home Exercise Plan  No updates this date     Consulted and Agree with Plan of Care  Patient       Patient will benefit from skilled therapeutic intervention  in order to improve the following deficits and impairments:  Decreased balance, Decreased endurance, Decreased mobility, Difficulty walking, Impaired sensation, Decreased range of motion, Decreased activity tolerance, Decreased coordination, Decreased strength, Pain, Postural dysfunction  Visit Diagnosis: 1. Muscle weakness (generalized)   2. Other lack of coordination   3. Difficulty in walking, not elsewhere classified   4. Pain in right hip   5. Pain in right ankle and joints of right foot   6. Weakness of right leg        Problem List Patient Active Problem List   Diagnosis Date Noted  . Vitamin D deficiency 10/29/2018  . Migraine without status migrainosus, not intractable   . Chronic pain syndrome   . Lumbar disc disease with radiculopathy 07/21/2017  . Lumbar foraminal stenosis 07/21/2017  . Unable to walk 07/21/2017  . Multiple falls 07/21/2017  . Displaced fracture of proximal end of right fibula 07/21/2017  . Lower extremity weakness 05/28/2017  . Right lower quadrant abdominal pain 08/22/2016  . Abnormal TSH 01/10/2016  . Morbid obesity ([place]) 01/12/2014  . Routine general medical examination at a health care facility 10/28/2012  . GERD (gastroesophageal reflux disease) 10/28/2012  . Anxiety 12/20/2011  . Allergic rhinitis 12/20/2011  . Asthma 12/20/2011  . Lapband APL with HH repair 03/31/2011  . Uncontrolled type 2 diabetes mellitus with hyperglycemia, with long-term current use of insulin ([place]) 06/30/2007  . Hyperlipidemia 06/30/2007  . Essential hypertension 06/27/2007    Alanson Puls, PT DPT 11/06/2018, 11:34 AM  [place] MAIN [facility] SERVICES [address] [place], [place], [address] Phone: [phone]   Fax:  [phone]  Name: [name] MRN: [id number] Date of Birth: 1965/06/30

## 2018-11-06 NOTE — Therapy (Signed)
[name] MAIN [facility] SERVICES [address] [place], [place], [address] Phone: [phone]   Fax:  [phone]  Occupational Therapy Treatment  Patient Details  Name: [name] MRN: [id number] Date of Birth: 02-16-1966 Referring Provider (OT): Maria Friendly, MD   Encounter Date: 11/06/2018  OT End of Session - 11/06/18 1900    Visit Number  8    Number of Visits  24    Date for OT Re-Evaluation  12/17/18    Authorization Type  [employer] progress report period starting 09-24-18    Authorization Time Period  09/24/18-12/17/18    OT Start Time  1100    OT Stop Time  1144    OT Time Calculation (min)  44 min    Activity Tolerance  Patient tolerated treatment well    Behavior During Therapy  [facility] for tasks assessed/performed       Past Medical History:  Diagnosis Date  . Allergy   . Amyotrophic lateral sclerosis (ALS) ([place]) 09/23/2018  . Anemia   . Anxiety   . Arthritis   . Asthma   . Colon polyp   . Complication of anesthesia   . Diabetes mellitus   . Edema   . Fatigue   . Hypertension   . IBS (irritable bowel syndrome)   . Lump in female breast   . Methicillin resistant Staphylococcus aureus in conditions classified elsewhere and of unspecified site   . Migraines   . Morbid obesity ([place])   . Night sweats   . Nonspecific abnormal results of thyroid function study   . Other B-complex deficiencies   . Paresthesias   . PONV (postoperative nausea and vomiting)   . Pure hypercholesterolemia   . Reflux   . Sacral fracture ([place])   . Sinus problem   . Symptomatic states associated with artificial menopause   . Thyroid disease   . Type II or unspecified type diabetes mellitus without mention of complication, not stated as uncontrolled   . Unspecified sleep apnea     Past Surgical History:  Procedure Laterality Date  . abcess removal  1997   MRSA  . ABDOMINAL HYSTERECTOMY  1997   complete in 1997, partial was in 1995  . carpal  tunnel release r  07/03/13   Dalldorf  . [place]  . CHOLECYSTECTOMY  03/1989  . COLONOSCOPY  03/21/2011   Normal.  [facility].  [name] KNEE SURGERY  2006  . LAPAROSCOPIC GASTRIC BANDING  05/2009  . TONSILLECTOMY AND ADENOIDECTOMY  1974  . TUBAL LIGATION    . ULNAR TUNNEL RELEASE Right 02/05/2018   Procedure: CUBITAL TUNNEL RELEASE/DECOMPRESSION;  Surgeon: Melrose Nakayama, MD;  Location: [place];  Service: Orthopedics;  Laterality: Right;    There were no vitals filed for this visit.  Subjective Assessment - 11/06/18 1106    Subjective   Patient reports she has ordered some adaptive equipment to make things easier at home, she is waiting to get the larger grip utensil and she would like to get a toilet aid next.    Pertinent History  [name] was recently diagnosed with ALS at [place] [facility].  She has had a recent decrease in strength in L side of body, UE and LE with decreased coordination.  She feels like she is dropping cups more and needs to use both hands for hand to mouth and handwriting has decreased to almost illegible.    Patient Stated Goals  I  want to work on handwriting, being able to hold a cup again and regain strength and coordination in both hands.    Currently in Pain?  No/denies    Pain Score  0-No pain    Multiple Pain Sites  No         Therapeutic Exercise: Patient seen for finger strengthening with [place] with use of green side for increased resistance for finger flexion with composite fist, thumb flexion and individual finger flexion with therapist demo and cues for technique.  Additional finger strengthening with push pins and bulletin board, unable to complete moderate resistive board therefore switched to mild resistive board to place and remove pins with cues for prehension patterns and use of tip to tip rather than lateral pinch pattern.   Neuromuscular Reeducation Manipulation of small  inch small earring backs from table top with cues to  pick up and sort into container.  Small bulletin board magnets to pick up and separate one handed with cues and place onto magnetic board in elevated plane of motion.   Response to tx:   Patient continues to progress well with use of adaptive devices to help compensate for muscle weakness, she is also working towards improving strength and motion in her hands to be able to be able to participate more at home with ADL and IADLs.  Continue OT to maximize safety and independence in daily activities.                    OT Education - 11/06/18 1900    Education Details  HEP    Person(s) Educated  Patient    Methods  Explanation;Demonstration;Verbal cues    Comprehension  Verbalized understanding;Returned demonstration          OT Long Term Goals - 09/24/18 1133      OT LONG TERM GOAL #1   Title  Pt will complete self feeding using adaptive utensils and compenstory techniques using R hand.    Baseline  Pt needs help with holding cup, cutting meat and uses L hand as assist.    Time  12    Period  Weeks    Status  New    Target Date  12/17/18      OT LONG TERM GOAL #2   Title  Pt will complete LB dressing using adaptive equipment with min assist and cues in unsupported sitting.    Baseline  Pt is dependent on her husband for socks, shoes, AFOs and pants over hips 50% of time.    Time  12    Period  Weeks    Status  New    Target Date  12/17/18      OT LONG TERM GOAL #3   Title  Pt will complete toileting and hygiene with adaptive aid with min assist and cues on elevated toilet with use of grab bar and FWW for balance.    Baseline  Pt unable to complete hygiene after BM; able to complete Independently after urinating.    Time  12    Period  Weeks    Status  New    Target Date  12/17/18      OT LONG TERM GOAL #4   Title  Pt will be educated in HEP for strengtening and fine motor coordination.    Baseline  Pt does not have any HEP for BUEs or hands.    Time  12     Period  Weeks  Status  New    Target Date  12/17/18      OT LONG TERM GOAL #5   Title  Pt will increase strength  in B hands for use during ADLs by 4#.    Time  12    Period  Weeks    Status  New    Target Date  12/17/18      Long Term Additional Goals   Additional Long Term Goals  Yes      OT LONG TERM GOAL #6   Title  Pt will increase strength 2# in B pinch for lateral and 3 point for use during ADLs.    Baseline  R 4# L 3#    Time  12    Period  Weeks    Status  New    Target Date  12/17/18            Plan - 11/06/18 1901    Clinical Impression Statement  Patient continues to progress well with use of adaptive devices to help compensate for muscle weakness, she is also working towards improving strength and motion in her hands to be able to be able to participate more at home with ADL and IADLs.  Continue OT to maximize safety and independence in daily activities.    OT Occupational Profile and History  Detailed Assessment- Review of Records and additional review of physical, cognitive, psychosocial history related to current functional performance    Occupational performance deficits (Please refer to evaluation for details):  ADL's;IADL's;Education;Leisure    Body Structure / Function / Physical Skills  Strength;Gait;FMC;IADL;Balance;ADL;Coordination;Endurance;UE functional use;Mobility    Rehab Potential  Good    Clinical Decision Making  Several treatment options, min-mod task modification necessary    Comorbidities Affecting Occupational Performance:  Presence of comorbidities impacting occupational performance    Comorbidities impacting occupational performance description:  progressive muscle weakness, balance, coordination    OT Frequency  2x / week    OT Duration  12 weeks    OT Treatment/Interventions  Moist Heat;Self-care/ADL training;Therapeutic exercise;Patient/family education;Energy conservation;Therapist, nutritional;Therapeutic activities;Balance  training;DME and/or AE instruction;Manual Therapy;Psychosocial skills training    Consulted and Agree with Plan of Care  Patient       Patient will benefit from skilled therapeutic intervention in order to improve the following deficits and impairments:   Body Structure / Function / Physical Skills: Strength, Gait, FMC, IADL, Balance, ADL, Coordination, Endurance, UE functional use, Mobility       Visit Diagnosis: 1. Muscle weakness (generalized)   2. Other lack of coordination       Problem List Patient Active Problem List   Diagnosis Date Noted  . Vitamin D deficiency 10/29/2018  . Migraine without status migrainosus, not intractable   . Chronic pain syndrome   . Lumbar disc disease with radiculopathy 07/21/2017  . Lumbar foraminal stenosis 07/21/2017  . Unable to walk 07/21/2017  . Multiple falls 07/21/2017  . Displaced fracture of proximal end of right fibula 07/21/2017  . Lower extremity weakness 05/28/2017  . Right lower quadrant abdominal pain 08/22/2016  . Abnormal TSH 01/10/2016  . Morbid obesity ([place]) 01/12/2014  . Routine general medical examination at a health care facility 10/28/2012  . GERD (gastroesophageal reflux disease) 10/28/2012  . Anxiety 12/20/2011  . Allergic rhinitis 12/20/2011  . Asthma 12/20/2011  . Lapband APL with HH repair 03/31/2011  . Uncontrolled type 2 diabetes mellitus with hyperglycemia, with long-term current use of insulin ([place]) 06/30/2007  . Hyperlipidemia 06/30/2007  .  Essential hypertension 06/27/2007   Achilles Dunk, OTR/L, CLT  [name] 11/07/2018, 6:42 PM  [place] MAIN [facility] SERVICES [address] [place], [place], [address] Phone: [phone]   Fax:  [phone]  Name: [name] MRN: [id number] Date of Birth: [birth year]

## 2018-11-11 ENCOUNTER — Ambulatory Visit: Payer: BC Managed Care – PPO | Admitting: Occupational Therapy

## 2018-11-11 ENCOUNTER — Ambulatory Visit: Payer: BC Managed Care – PPO | Admitting: Physical Therapy

## 2018-11-13 ENCOUNTER — Other Ambulatory Visit: Payer: Self-pay

## 2018-11-13 ENCOUNTER — Encounter: Payer: Self-pay | Admitting: Occupational Therapy

## 2018-11-13 ENCOUNTER — Ambulatory Visit: Payer: BC Managed Care – PPO | Admitting: Physical Therapy

## 2018-11-13 ENCOUNTER — Ambulatory Visit: Payer: BC Managed Care – PPO | Admitting: Occupational Therapy

## 2018-11-13 ENCOUNTER — Encounter: Payer: Self-pay | Admitting: Physical Therapy

## 2018-11-13 DIAGNOSIS — M6281 Muscle weakness (generalized): Secondary | ICD-10-CM

## 2018-11-13 DIAGNOSIS — R278 Other lack of coordination: Secondary | ICD-10-CM

## 2018-11-13 DIAGNOSIS — M25571 Pain in right ankle and joints of right foot: Secondary | ICD-10-CM

## 2018-11-13 DIAGNOSIS — M25551 Pain in right hip: Secondary | ICD-10-CM

## 2018-11-13 DIAGNOSIS — R29898 Other symptoms and signs involving the musculoskeletal system: Secondary | ICD-10-CM

## 2018-11-13 DIAGNOSIS — R262 Difficulty in walking, not elsewhere classified: Secondary | ICD-10-CM

## 2018-11-13 NOTE — Therapy (Signed)
[place] MAIN [facility] SERVICES [address] [place], [place], [address] Phone: [phone]   Fax:  [phone]  Occupational Therapy Treatment  Patient Details  Name: [name] MRN: [id number] Date of Birth: 1965-04-27 Referring Provider (OT): Alma Friendly, MD   Encounter Date: 11/13/2018  OT End of Session - 11/13/18 1113    Visit Number  9    Number of Visits  24    Date for OT Re-Evaluation  12/17/18    Authorization Time Period  09/24/18-12/17/18    OT Start Time  1105    OT Stop Time  1145    OT Time Calculation (min)  40 min    Activity Tolerance  Patient tolerated treatment well    Behavior During Therapy  [facility] for tasks assessed/performed       Past Medical History:  Diagnosis Date  . Allergy   . Amyotrophic lateral sclerosis (ALS) ([place]) 09/23/2018  . Anemia   . Anxiety   . Arthritis   . Asthma   . Colon polyp   . Complication of anesthesia   . Diabetes mellitus   . Edema   . Fatigue   . Hypertension   . IBS (irritable bowel syndrome)   . Lump in female breast   . Methicillin resistant Staphylococcus aureus in conditions classified elsewhere and of unspecified site   . Migraines   . Morbid obesity ([place])   . Night sweats   . Nonspecific abnormal results of thyroid function study   . Other B-complex deficiencies   . Paresthesias   . PONV (postoperative nausea and vomiting)   . Pure hypercholesterolemia   . Reflux   . Sacral fracture ([place])   . Sinus problem   . Symptomatic states associated with artificial menopause   . Thyroid disease   . Type II or unspecified type diabetes mellitus without mention of complication, not stated as uncontrolled   . Unspecified sleep apnea     Past Surgical History:  Procedure Laterality Date  . abcess removal  1997   MRSA  . ABDOMINAL HYSTERECTOMY  1997   complete in 1997, partial was in 1995  . carpal tunnel release r  07/03/13   [facility]orf  . [place]  . CHOLECYSTECTOMY  03/1989  . COLONOSCOPY  03/21/2011   Normal.  [facility].  [name] KNEE SURGERY  2006  . LAPAROSCOPIC GASTRIC BANDING  05/2009  . TONSILLECTOMY AND ADENOIDECTOMY  1974  . TUBAL LIGATION    . ULNAR TUNNEL RELEASE Right 02/05/2018   Procedure: CUBITAL TUNNEL RELEASE/DECOMPRESSION;  Surgeon: Melrose Nakayama, MD;  Location: [facility];  Service: Orthopedics;  Laterality: Right;    There were no vitals filed for this visit.  Subjective Assessment - 11/13/18 1112    Subjective   Pt. reports that her husband had emergency eye surgery on Monday.    Patient is accompanied by:  Family member    Pertinent History  [name] was recently diagnosed with ALS at [facility].  She has had a recent decrease in strength in L side of body, UE and LE with decreased coordination.  She feels like she is dropping cups more and needs to use both hands for hand to mouth and handwriting has decreased to almost illegible.    Patient Stated Goals  I want to work on handwriting, being able to hold a cup again and regain strength and coordination in both hands.    Currently  in Pain?  No/denies      OT TREATMENT    Neuro muscular re-education:  Pt. worked on right hand [facility] skills reaching, and grasping silk flower stems from a deep basket set at the tabletop, sorting, and arranging the flowers into a vase. Pt. education was provided about modifications for performing the task at home.  Therapeutic Exercise:  Pt. performed bilateral gross gripping with grip strengthener. Pt. worked on sustaining grip while grasping pegs and reaching at various heights. The gripper was placed in the 2nd resistive slot with the white resistive spring. Pt. required frequent repositioning of the right 5th digit. Pt. Drooped several pegs initially on the right.                           OT Education - 11/13/18 1113    Education Details  HEP    Person(s) Educated  Patient    Methods   Explanation;Demonstration;Verbal cues    Comprehension  Verbalized understanding;Returned demonstration          OT Long Term Goals - 09/24/18 1133      OT LONG TERM GOAL #1   Title  Pt will complete self feeding using adaptive utensils and compenstory techniques using R hand.    Baseline  Pt needs help with holding cup, cutting meat and uses L hand as assist.    Time  12    Period  Weeks    Status  New    Target Date  12/17/18      OT LONG TERM GOAL #2   Title  Pt will complete LB dressing using adaptive equipment with min assist and cues in unsupported sitting.    Baseline  Pt is dependent on her husband for socks, shoes, AFOs and pants over hips 50% of time.    Time  12    Period  Weeks    Status  New    Target Date  12/17/18      OT LONG TERM GOAL #3   Title  Pt will complete toileting and hygiene with adaptive aid with min assist and cues on elevated toilet with use of grab bar and FWW for balance.    Baseline  Pt unable to complete hygiene after BM; able to complete Independently after urinating.    Time  12    Period  Weeks    Status  New    Target Date  12/17/18      OT LONG TERM GOAL #4   Title  Pt will be educated in HEP for strengtening and fine motor coordination.    Baseline  Pt does not have any HEP for BUEs or hands.    Time  12    Period  Weeks    Status  New    Target Date  12/17/18      OT LONG TERM GOAL #5   Title  Pt will increase strength  in B hands for use during ADLs by 4#.    Time  12    Period  Weeks    Status  New    Target Date  12/17/18      Long Term Additional Goals   Additional Long Term Goals  Yes      OT LONG TERM GOAL #6   Title  Pt will increase strength 2# in B pinch for lateral and 3 point for use during ADLs.    Baseline  R 4# L 3#  Time  12    Period  Weeks    Status  New    Target Date  12/17/18            Plan - 11/13/18 1114    Clinical Impression Statement  Pt. reports that her husbans had emergency eye  surgery on Monday. Pt. reports being concerned about transportation as he wont be able to drive for a period of time. Pt. reports that her daughter is in from out of town to help, but leaves next week. Pt. education was provided about options for adaptive female reazaer extensions.    OT Occupational Profile and History  Detailed Assessment- Review of Records and additional review of physical, cognitive, psychosocial history related to current functional performance    Occupational performance deficits (Please refer to evaluation for details):  ADL's;IADL's;Education;Leisure    Body Structure / Function / Physical Skills  Strength;Gait;FMC;IADL;Balance;ADL;Coordination;Endurance;UE functional use;Mobility    Rehab Potential  Good    Clinical Decision Making  Several treatment options, min-mod task modification necessary    Comorbidities Affecting Occupational Performance:  Presence of comorbidities impacting occupational performance    OT Frequency  2x / week    OT Duration  12 weeks    OT Treatment/Interventions  Moist Heat;Self-care/ADL training;Therapeutic exercise;Patient/family education;Energy conservation;Therapist, nutritional;Therapeutic activities;Balance training;DME and/or AE instruction;Manual Therapy;Psychosocial skills training    Consulted and Agree with Plan of Care  Patient       Patient will benefit from skilled therapeutic intervention in order to improve the following deficits and impairments:   Body Structure / Function / Physical Skills: Strength, Gait, FMC, IADL, Balance, ADL, Coordination, Endurance, UE functional use, Mobility       Visit Diagnosis: Muscle weakness (generalized)  Other lack of coordination    Problem List Patient Active Problem List   Diagnosis Date Noted  . Vitamin D deficiency 10/29/2018  . Migraine without status migrainosus, not intractable   . Chronic pain syndrome   . Lumbar disc disease with radiculopathy 07/21/2017  . Lumbar  foraminal stenosis 07/21/2017  . Unable to walk 07/21/2017  . Multiple falls 07/21/2017  . Displaced fracture of proximal end of right fibula 07/21/2017  . Lower extremity weakness 05/28/2017  . Right lower quadrant abdominal pain 08/22/2016  . Abnormal TSH 01/10/2016  . Morbid obesity ([place]) 01/12/2014  . Routine general medical examination at a health care facility 10/28/2012  . GERD (gastroesophageal reflux disease) 10/28/2012  . Anxiety 12/20/2011  . Allergic rhinitis 12/20/2011  . Asthma 12/20/2011  . Lapband APL with HH repair 03/31/2011  . Uncontrolled type 2 diabetes mellitus with hyperglycemia, with long-term current use of insulin ([place]) 06/30/2007  . Hyperlipidemia 06/30/2007  . Essential hypertension 06/27/2007    Harrel Carina, MS, OTR/L 11/13/2018, 12:16 PM  [place] MAIN [facility] SERVICES [address] [place], [place], [address] Phone: [phone]   Fax:  [phone]  Name: [name] MRN: [id number] Date of Birth: 1965/11/19

## 2018-11-13 NOTE — Therapy (Signed)
[place] MAIN [facility] SERVICES [address] [place], [place], [address] Phone: [phone]   Fax:  [phone]  Physical Therapy Treatment  Patient Details  Name: [name] MRN: [id number] Date of Birth: 1966-03-04 Referring Provider (PT): Cornell Barman   Encounter Date: 11/13/2018  PT End of Session - 11/13/18 1116    Visit Number  25    Number of Visits  41    Date for PT Re-Evaluation  10/29/18    PT Start Time  1517    PT Stop Time  1230    PT Time Calculation (min)  45 min    Equipment Utilized During Treatment  Gait belt;Other (comment)   AFO   Activity Tolerance  Patient tolerated treatment well;Patient limited by fatigue    Behavior During Therapy  [facility] for tasks assessed/performed       Past Medical History:  Diagnosis Date  . Allergy   . Amyotrophic lateral sclerosis (ALS) ([place]) 09/23/2018  . Anemia   . Anxiety   . Arthritis   . Asthma   . Colon polyp   . Complication of anesthesia   . Diabetes mellitus   . Edema   . Fatigue   . Hypertension   . IBS (irritable bowel syndrome)   . Lump in female breast   . Methicillin resistant Staphylococcus aureus in conditions classified elsewhere and of unspecified site   . Migraines   . Morbid obesity ([place])   . Night sweats   . Nonspecific abnormal results of thyroid function study   . Other B-complex deficiencies   . Paresthesias   . PONV (postoperative nausea and vomiting)   . Pure hypercholesterolemia   . Reflux   . Sacral fracture ([place])   . Sinus problem   . Symptomatic states associated with artificial menopause   . Thyroid disease   . Type II or unspecified type diabetes mellitus without mention of complication, not stated as uncontrolled   . Unspecified sleep apnea     Past Surgical History:  Procedure Laterality Date  . abcess removal  1997   MRSA  . ABDOMINAL HYSTERECTOMY  1997   complete in 1997, partial was in 1995  . carpal tunnel release r   07/03/13   Dalldorf  . [place]  . CHOLECYSTECTOMY  03/1989  . COLONOSCOPY  03/21/2011   Normal.  [facility].  [name] KNEE SURGERY  2006  . LAPAROSCOPIC GASTRIC BANDING  05/2009  . TONSILLECTOMY AND ADENOIDECTOMY  1974  . TUBAL LIGATION    . ULNAR TUNNEL RELEASE Right 02/05/2018   Procedure: CUBITAL TUNNEL RELEASE/DECOMPRESSION;  Surgeon: Melrose Nakayama, MD;  Location: [place];  Service: Orthopedics;  Laterality: Right;    There were no vitals filed for this visit.  Subjective Assessment - 11/13/18 1115    Subjective  Patient reports she has been doing well, her left knee had an episode of pain during sitting on the commode and she had to move her left leg flex and extend for a while and then it stopped hurting.    Patient is accompained by:  Family member    Pertinent History   Patient fx her sacrum 9/18 and  she lost sensation to right foot, and she lost ability to DF her right foot. Patient had back surgery 03/2017 , she was sent home. She was walking with no device indoors and she could get out of the house and do the steps. June 18, 2017  her LLE was getting weaker and the right leg was getting stronger. Patient had follow up with neurosurgery. She was scheduled for a steriod shot Jul 23, 2017, but on May 3 her legs didnt  feel right, she fell 4 times, she fractured her R fibula  on may 3rd, she went to hospital and spent one week. She went to SNF for 1 month. She had another neuro consult, resulting in kDx of  polyneuropathy. Then she had an apt with and [facility] said it was not polyneuropathy, (in [place])  She has a second consultation scheduled Dec 30 for a  Neuromuscular.consult.  She had a scan of the spine T 12-28-17  who then sent her to a thoracic surgeon at the end of November and was recommended to not due surgery. She does not know what her Dx is and she has been having home PT beginning August 25, 2017 . She has had HHPT 2 x  week for the last 5 months. She is able to walk 50  feet with RW. She is able to transfer from wc to stand independently.     How long can you stand comfortably?  5 mins    Patient Stated Goals  to walk without the RW, or walk better, use the bathroom    Currently in Pain?  No/denies    Pain Score  0-No pain    Pain Onset  In the past 7 days        Treatment; Nu-step x 5 mins L 4  SAQ with 4 lbs and 5 lb and 5 sec hold x 20 hooklying marching with 4 lbs x 15 reps x 2 LLE, 20 reps x 2 RLE  Transfers sit to stand <> from hospital wc that needs to be gotten out of off the side<> stand up with RW with min assist to hold the RW  Instructed in better positions for removal of SLB's     Pt educated  about  posture and technique with exercises to improve exercise technique, movement at target joints, with min  verbal, visual, tactile cues.                      PT Education - 11/13/18 1116    Education provided  Yes    Education Details  HEP    Person(s) Educated  Patient    Methods  Explanation;Demonstration;Tactile cues;Verbal cues    Comprehension  Verbalized understanding;Returned demonstration;Need further instruction       PT Short Term Goals - 10/23/18 1159      PT SHORT TERM GOAL #1   Title  Patient will be independent in home exercise program to improve strength/mobility for better functional independence with ADLs.    Time  6    Period  Weeks    Status  Partially Met    Target Date  09/10/18        PT Long Term Goals - 11/04/18 1513      PT LONG TERM GOAL #1   Title  Patient will increase 10 meter walk test to >.50 m/s as to improve gait speed for better community ambulation and to reduce fall risk.    Baseline  . 36 m/sec,, 10/23/18 =.43 m/ sec    Time  8    Period  Weeks    Status  Partially Met    Target Date  12/31/18      PT LONG TERM GOAL #2   Title  Patient will  transfer sit to stand  from 19 inch mat without assist or anyone supporting AD  to demonstrate improved LE strength to decrease  falls risk.     Time  12    Period  Weeks    Status  Achieved      PT LONG TERM GOAL #3   Title  Patient will demonstrate stance without AD  x 1 min to demonstrate decreased falls risk.     Baseline  10/23/18 =    Time  12    Period  Weeks    Status  Achieved    Target Date  12/31/18      PT LONG TERM GOAL #4   Title  Patient will  ambulate 500 feet to work towards community ambulation distances.     Baseline  270 feet, 10/23/18= 305 feet    Time  12    Period  Weeks    Status  Partially Met    Target Date  12/31/18      PT LONG TERM GOAL #5   Title  Patient (< 67 years old) will complete five times sit to stand test in < 10 seconds indicating an increased LE strength and improved balance.    Baseline  20.28 m/sec. 10/23/18 =16.82 ( from green chair)    Time  12    Period  Weeks    Status  Partially Met    Target Date  12/31/18      Additional Long Term Goals   Additional Long Term Goals  Yes      PT LONG TERM GOAL #6   Title  Patient will report a worst pain of 3/10 on VAS in left knee             to improve tolerance with ADLs and reduced symptoms with activities.    Time  12    Period  Weeks    Status  New    Target Date  12/31/18            Plan - 11/13/18 1118    Clinical Impression Statement  Patient has decreased strength of BLE and trunk with deficits in transfers and amublation with RW.She has decreased standing tolerance and decreased gait distances due to weakness and fatigue. She perofmrs open and closed chain LE exercises in standing and supine with vC for correct technique and form. She will continue to benefit from skilled PT to improve mobility and safety.    Rehab Potential  Good    Clinical Impairments Affecting Rehab Potential  Multiple tests concerning for neurologic involvement     PT Frequency  2x / week    PT Duration  12 weeks    PT Treatment/Interventions  Gait training;Therapeutic activities;Therapeutic exercise;Balance training;Neuromuscular  re-education;Patient/family education;Orthotic Fit/Training;Manual techniques;Ultrasound;Moist Heat    PT Next Visit Plan  get updated feedback on pain after leg press    PT Home Exercise Plan  No updates this date     Consulted and Agree with Plan of Care  Patient       Patient will benefit from skilled therapeutic intervention in order to improve the following deficits and impairments:  Decreased balance, Decreased endurance, Decreased mobility, Difficulty walking, Impaired sensation, Decreased range of motion, Decreased activity tolerance, Decreased coordination, Decreased strength, Pain, Postural dysfunction  Visit Diagnosis: Muscle weakness (generalized)  Difficulty in walking, not elsewhere classified  Other lack of coordination  Pain in right hip  Pain in right ankle and joints of right foot  Weakness of right leg     Problem List Patient Active Problem List   Diagnosis Date Noted  . Vitamin D deficiency 10/29/2018  . Migraine without status migrainosus, not intractable   . Chronic pain syndrome   . Lumbar disc disease with radiculopathy 07/21/2017  . Lumbar foraminal stenosis 07/21/2017  . Unable to walk 07/21/2017  . Multiple falls 07/21/2017  . Displaced fracture of proximal end of right fibula 07/21/2017  . Lower extremity weakness 05/28/2017  . Right lower quadrant abdominal pain 08/22/2016  . Abnormal TSH 01/10/2016  . Morbid obesity ([place]) 01/12/2014  . Routine general medical examination at a health care facility 10/28/2012  . GERD (gastroesophageal reflux disease) 10/28/2012  . Anxiety 12/20/2011  . Allergic rhinitis 12/20/2011  . Asthma 12/20/2011  . Lapband APL with HH repair 03/31/2011  . Uncontrolled type 2 diabetes mellitus with hyperglycemia, with long-term current use of insulin ([place]) 06/30/2007  . Hyperlipidemia 06/30/2007  . Essential hypertension 06/27/2007    Alanson Puls, PT DPT 11/13/2018, 11:22 AM  [place] MAIN [facility] SERVICES [address] [place], [place], [address] Phone: [phone]   Fax:  [phone]  Name: [name] MRN: [id number] Date of Birth: 1965-08-29

## 2018-11-18 ENCOUNTER — Encounter: Payer: Self-pay | Admitting: Occupational Therapy

## 2018-11-18 ENCOUNTER — Ambulatory Visit: Payer: BC Managed Care – PPO | Admitting: Occupational Therapy

## 2018-11-18 ENCOUNTER — Ambulatory Visit: Payer: BC Managed Care – PPO | Admitting: Physical Therapy

## 2018-11-18 ENCOUNTER — Other Ambulatory Visit: Payer: Self-pay

## 2018-11-18 ENCOUNTER — Encounter: Payer: Self-pay | Admitting: Physical Therapy

## 2018-11-18 DIAGNOSIS — R278 Other lack of coordination: Secondary | ICD-10-CM

## 2018-11-18 DIAGNOSIS — R29898 Other symptoms and signs involving the musculoskeletal system: Secondary | ICD-10-CM

## 2018-11-18 DIAGNOSIS — M25571 Pain in right ankle and joints of right foot: Secondary | ICD-10-CM

## 2018-11-18 DIAGNOSIS — M25551 Pain in right hip: Secondary | ICD-10-CM

## 2018-11-18 DIAGNOSIS — M6281 Muscle weakness (generalized): Secondary | ICD-10-CM

## 2018-11-18 DIAGNOSIS — R262 Difficulty in walking, not elsewhere classified: Secondary | ICD-10-CM

## 2018-11-18 NOTE — Therapy (Signed)
[place] MAIN [facility] SERVICES [address] [place], [place], [address] Phone: [phone]   Fax:  [phone]  Physical Therapy Treatment  Patient Details  Name: [name] MRN: [id number] Date of Birth: Aug 01, 1965 Referring Provider (PT): Cornell Barman   Encounter Date: 11/18/2018  PT End of Session - 11/18/18 1313    Visit Number  25    Number of Visits  41    Date for PT Re-Evaluation  12/31/18    PT Start Time  7654    PT Stop Time  1230    PT Time Calculation (min)  43 min    Equipment Utilized During Treatment  Gait belt;Other (comment)   AFO   Activity Tolerance  Patient tolerated treatment well;Patient limited by fatigue    Behavior During Therapy  [facility] for tasks assessed/performed       Past Medical History:  Diagnosis Date  . Allergy   . Amyotrophic lateral sclerosis (ALS) ([place]) 09/23/2018  . Anemia   . Anxiety   . Arthritis   . Asthma   . Colon polyp   . Complication of anesthesia   . Diabetes mellitus   . Edema   . Fatigue   . Hypertension   . IBS (irritable bowel syndrome)   . Lump in female breast   . Methicillin resistant Staphylococcus aureus in conditions classified elsewhere and of unspecified site   . Migraines   . Morbid obesity ([place])   . Night sweats   . Nonspecific abnormal results of thyroid function study   . Other B-complex deficiencies   . Paresthesias   . PONV (postoperative nausea and vomiting)   . Pure hypercholesterolemia   . Reflux   . Sacral fracture ([place])   . Sinus problem   . Symptomatic states associated with artificial menopause   . Thyroid disease   . Type II or unspecified type diabetes mellitus without mention of complication, not stated as uncontrolled   . Unspecified sleep apnea     Past Surgical History:  Procedure Laterality Date  . abcess removal  1997   MRSA  . ABDOMINAL HYSTERECTOMY  1997   complete in 1997, partial was in 1995  . carpal tunnel release r   07/03/13   Dalldorf  . [place]  . CHOLECYSTECTOMY  03/1989  . COLONOSCOPY  03/21/2011   Normal.  [facility].  [name] KNEE SURGERY  2006  . LAPAROSCOPIC GASTRIC BANDING  05/2009  . TONSILLECTOMY AND ADENOIDECTOMY  1974  . TUBAL LIGATION    . ULNAR TUNNEL RELEASE Right 02/05/2018   Procedure: CUBITAL TUNNEL RELEASE/DECOMPRESSION;  Surgeon: Melrose Nakayama, MD;  Location: [place];  Service: Orthopedics;  Laterality: Right;    There were no vitals filed for this visit.  Subjective Assessment - 11/18/18 1224    Subjective  Patient reports she has been doing well, her left knee had an episode of pain during sitting on the commode and she had to move her left leg flex and extend for a while and then it stopped hurting.    Patient is accompained by:  Family member    Pertinent History   Patient fx her sacrum 9/18 and  she lost sensation to right foot, and she lost ability to DF her right foot. Patient had back surgery 03/2017 , she was sent home. She was walking with no device indoors and she could get out of the house and do the steps. June 18, 2017  her LLE was getting weaker and the right leg was getting stronger. Patient had follow up with neurosurgery. She was scheduled for a steriod shot Jul 23, 2017, but on May 3 her legs didnt  feel right, she fell 4 times, she fractured her R fibula  on may 3rd, she went to hospital and spent one week. She went to SNF for 1 month. She had another neuro consult, resulting in kDx of  polyneuropathy. Then she had an apt with and [facility] said it was not polyneuropathy, (in [place])  She has a second consultation scheduled Dec 30 for a  Neuromuscular.consult.  She had a scan of the spine T 12-28-17  who then sent her to a thoracic surgeon at the end of November and was recommended to not due surgery. She does not know what her Dx is and she has been having home PT beginning August 25, 2017 . She has had HHPT 2 x  week for the last 5 months. She is able to walk 50  feet with RW. She is able to transfer from wc to stand independently.     How long can you stand comfortably?  5 mins    Patient Stated Goals  to walk without the RW, or walk better, use the bathroom    Currently in Pain?  Yes    Pain Score  1     Pain Location  Knee    Pain Orientation  Right;Left    Pain Onset  In the past 7 days       Treatment: Nu-step x 10 mins L 4 , cues to bring left leg to neutral  Side stepping with YTB x 4 with single HHA Hip extension BLE 10 x 2  Hip abd BLE 10 x 2  Hip extension BLE 10 x 2  Sit to stand from chair x 5 reps, CGA to hold the chair Marching high x 20 , standing BLE Patient attempts to side step with 1 UE support  Pt educated  about  posture and technique with exercises to improve exercise technique, movement at target joints, with min  verbal, visual, tactile cues.                       PT Education - 11/18/18 1312    Education provided  Yes    Education Details  HEP    Person(s) Educated  Patient    Methods  Explanation    Comprehension  Verbalized understanding;Returned demonstration;Tactile cues required;Need further instruction       PT Short Term Goals - 10/23/18 1159      PT SHORT TERM GOAL #1   Title  Patient will be independent in home exercise program to improve strength/mobility for better functional independence with ADLs.    Time  6    Period  Weeks    Status  Partially Met    Target Date  09/10/18        PT Long Term Goals - 11/04/18 1513      PT LONG TERM GOAL #1   Title  Patient will increase 10 meter walk test to >.50 m/s as to improve gait speed for better community ambulation and to reduce fall risk.    Baseline  . 36 m/sec,, 10/23/18 =.43 m/ sec    Time  8    Period  Weeks    Status  Partially Met    Target Date  12/31/18      PT  LONG TERM GOAL #2   Title  Patient will transfer sit to stand  from 19 inch mat without assist or anyone supporting AD  to demonstrate improved LE strength  to decrease falls risk.     Time  12    Period  Weeks    Status  Achieved      PT LONG TERM GOAL #3   Title  Patient will demonstrate stance without AD  x 1 min to demonstrate decreased falls risk.     Baseline  10/23/18 =    Time  12    Period  Weeks    Status  Achieved    Target Date  12/31/18      PT LONG TERM GOAL #4   Title  Patient will  ambulate 500 feet to work towards community ambulation distances.     Baseline  270 feet, 10/23/18= 305 feet    Time  12    Period  Weeks    Status  Partially Met    Target Date  12/31/18      PT LONG TERM GOAL #5   Title  Patient (< 54 years old) will complete five times sit to stand test in < 10 seconds indicating an increased LE strength and improved balance.    Baseline  20.28 m/sec. 10/23/18 =16.82 ( from green chair)    Time  12    Period  Weeks    Status  Partially Met    Target Date  12/31/18      Additional Long Term Goals   Additional Long Term Goals  Yes      PT LONG TERM GOAL #6   Title  Patient will report a worst pain of 3/10 on VAS in left knee             to improve tolerance with ADLs and reduced symptoms with activities.    Time  12    Period  Weeks    Status  New    Target Date  12/31/18            Plan - 11/18/18 1314    Clinical Impression Statement  Patient demonstrates LOB with standing balance exercises indicating decreased balancing strategies. Patient did require UE support to perform sidestepping with YTB.  Patient will benefit from further skilled therapy to improve strength BLE, improve mobility and gait.   Pt would continue to benefit from skilled therapy services in order to further address LE strength deficits and balance deficits in order to decrease fall risk and improve mobility    Rehab Potential  Good    Clinical Impairments Affecting Rehab Potential  Multiple tests concerning for neurologic involvement     PT Frequency  2x / week    PT Duration  12 weeks    PT Treatment/Interventions  Gait  training;Therapeutic activities;Therapeutic exercise;Balance training;Neuromuscular re-education;Patient/family education;Orthotic Fit/Training;Manual techniques    PT Next Visit Plan  get updated feedback on pain after leg press    PT Home Exercise Plan  No updates this date     Consulted and Agree with Plan of Care  Patient       Patient will benefit from skilled therapeutic intervention in order to improve the following deficits and impairments:  Decreased balance, Decreased endurance, Decreased mobility, Difficulty walking, Impaired sensation, Decreased range of motion, Decreased activity tolerance, Decreased coordination, Decreased strength, Pain, Postural dysfunction  Visit Diagnosis: Other lack of coordination  Muscle weakness (generalized)  Difficulty in walking, not elsewhere  classified  Pain in right ankle and joints of right foot  Pain in right hip  Weakness of right leg     Problem List Patient Active Problem List   Diagnosis Date Noted  . Vitamin D deficiency 10/29/2018  . Migraine without status migrainosus, not intractable   . Chronic pain syndrome   . Lumbar disc disease with radiculopathy 07/21/2017  . Lumbar foraminal stenosis 07/21/2017  . Unable to walk 07/21/2017  . Multiple falls 07/21/2017  . Displaced fracture of proximal end of right fibula 07/21/2017  . Lower extremity weakness 05/28/2017  . Right lower quadrant abdominal pain 08/22/2016  . Abnormal TSH 01/10/2016  . Morbid obesity ([place]) 01/12/2014  . Routine general medical examination at a health care facility 10/28/2012  . GERD (gastroesophageal reflux disease) 10/28/2012  . Anxiety 12/20/2011  . Allergic rhinitis 12/20/2011  . Asthma 12/20/2011  . Lapband APL with HH repair 03/31/2011  . Uncontrolled type 2 diabetes mellitus with hyperglycemia, with long-term current use of insulin ([place]) 06/30/2007  . Hyperlipidemia 06/30/2007  . Essential hypertension 06/27/2007    Alanson Puls, PT DPT 11/18/2018, 1:19 PM  [place] MAIN [facility] SERVICES [address] [place], [place], [address] Phone: [phone]   Fax:  [phone]  Name: MANDY PEEKS MRN: [id number] Date of Birth: Mar 14, 1966

## 2018-11-18 NOTE — Therapy (Signed)
[place] MAIN [place] [address] [place], [place], [address] Phone: [phone]   Fax:  [phone]  Occupational Therapy Treatment/Progress Note/10th visit Progress period starting 09/24/2018 to 11/18/2018  Patient Details  Name: [name] MRN: [id number] Date of Birth: [birth year] Referring Provider (OT): Alma Friendly, MD   Encounter Date: 11/18/2018  OT End of Session - 11/18/18 1133    Visit Number  10    Number of Visits  24    Date for OT Re-Evaluation  12/17/18    Authorization Type  [employer] progress report period starting 09-24-18    Authorization Time Period  09/24/18-12/17/18    OT Start Time  1100    OT Stop Time  1145    OT Time Calculation (min)  45 min    Activity Tolerance  Patient tolerated treatment well    Behavior During Therapy  [facility] for tasks assessed/performed       Past Medical History:  Diagnosis Date  . Allergy   . Amyotrophic lateral sclerosis (ALS) ([employer]) 09/23/2018  . Anemia   . Anxiety   . Arthritis   . Asthma   . Colon polyp   . Complication of anesthesia   . Diabetes mellitus   . Edema   . Fatigue   . Hypertension   . IBS (irritable bowel syndrome)   . Lump in female breast   . Methicillin resistant Staphylococcus aureus in conditions classified elsewhere and of unspecified site   . Migraines   . Morbid obesity ([place])   . Night sweats   . Nonspecific abnormal results of thyroid function study   . Other B-complex deficiencies   . Paresthesias   . PONV (postoperative nausea and vomiting)   . Pure hypercholesterolemia   . Reflux   . Sacral fracture ([place])   . Sinus problem   . Symptomatic states associated with artificial menopause   . Thyroid disease   . Type II or unspecified type diabetes mellitus without mention of complication, not stated as uncontrolled   . Unspecified sleep apnea     Past Surgical History:  Procedure Laterality Date  . abcess removal  1997   MRSA  . ABDOMINAL  HYSTERECTOMY  1997   complete in 1997, partial was in 1995  . carpal tunnel release r  07/03/13   Dalldorf  . [place]  . CHOLECYSTECTOMY  03/1989  . COLONOSCOPY  03/21/2011   Normal.  [facility].  [name] KNEE SURGERY  2006  . LAPAROSCOPIC GASTRIC BANDING  05/2009  . TONSILLECTOMY AND ADENOIDECTOMY  1974  . TUBAL LIGATION    . ULNAR TUNNEL RELEASE Right 02/05/2018   Procedure: CUBITAL TUNNEL RELEASE/DECOMPRESSION;  Surgeon: [place] Nakayama, MD;  Location: [place];  Service: Orthopedics;  Laterality: Right;    There were no vitals filed for this visit.  Subjective Assessment - 11/18/18 1108    Subjective   Patient reports she got her rocker knife, toilet wand, long handled shaver in the last week.    Pertinent History  [name] was recently diagnosed with ALS at [place] [facility].  She has had a recent decrease in strength in L side of body, UE and LE with decreased coordination.  She feels like she is dropping cups more and needs to use both hands for hand to mouth and handwriting has decreased to almost illegible.    Patient Stated Goals  I want to work on handwriting, being able to hold  a cup again and regain strength and coordination in both hands.    Currently in Pain?  Yes    Pain Score  1     Pain Location  Knee    Pain Orientation  Right;Left    Pain Descriptors / Indicators  Aching    Pain Type  Chronic pain    Pain Onset  More than a month ago    Pain Frequency  Intermittent    Multiple Pain Sites  No         [facility] OT Assessment - 11/18/18 1141      Coordination   9 Hole Peg Test  Right;Left    Right 9 Hole Peg Test  28 secs    Left 9 Hole Peg Test  28 secs      Hand Function   Right Hand Grip (lbs)  25    Right Hand Lateral Pinch  5 lbs    Right Hand 3 Point Pinch  5 lbs    Left Hand Grip (lbs)  21    Left Hand Lateral Pinch  5 lbs    Left 3 point pinch  4 lbs    Comment  2 point pinch R 4, left 3#      Patient seen for therapeutic exercises  as follows for UB strengthening:  Finger strengthening with use of Digi flex .7 kg with isolated fingers on right hand.assist at small finger to not hyperextend. Left hand also completed with minimal assist at times to keep fingers in place.  Reassessment of measurements and goals, please refer to above flow sheet for measurements.    Neuromuscular Reeducation: manipulation of [place] discs, right hand, left hand and then simultaneous use of bilateral UEs, cues for speed and dexterity as well as isolated index finger movement to turn items to opposite side to place into board.    Response to tx:  Patient has made significant progress over the last sessions with grip and pinch strength, her right pinch increased by 11# in this time frame and 12# on the left.  Pinch has also improved and patient has seen improvements with her self care tasks, her husband only has to help her with her shoes and AFOs, she is now able to don and doff her socks, negotiate clothing over hips.  She is pleased with her progress and continues to order and receive adaptive equipment that will help further her independence in self care and IADL tasks.  Patient continues to benefit from skilled OT services, continue to work towards goals in plan of care to maximize safety and independence in necessary daily tasks.                          OT Education - 11/18/18 1505    Education Details  reassessment findings, goals, plan of care    Person(s) Educated  Patient    Methods  Explanation;Demonstration;Verbal cues    Comprehension  Verbalized understanding;Returned demonstration          OT Long Term Goals - 11/18/18 1134      OT LONG TERM GOAL #1   Title  Pt will complete self feeding using adaptive utensils and compenstory techniques using R hand.    Baseline  Pt needs help with holding cup, cutting meat and uses L hand as assist.    Time  12    Period  Weeks    Status  On-going  OT LONG  TERM GOAL #2   Title  Pt will complete LB dressing using adaptive equipment with min assist and cues in unsupported sitting.    Baseline  Now able to do her socks, pants but needs help with AFOs and shoes 11/18/18    Time  12    Period  Weeks    Status  On-going      OT LONG TERM GOAL #3   Title  Pt will complete toileting and hygiene with adaptive aid with min assist and cues on elevated toilet with use of grab bar and FWW for balance.    Baseline  Pt unable to complete hygiene after BM; able to complete Independently after urinating.    Time  12    Period  Weeks    Status  On-going      OT LONG TERM GOAL #4   Title  Pt will be educated in HEP for strengtening and fine motor coordination.    Baseline  Pt does not have any HEP for BUEs or hands.    Time  12    Period  Weeks    Status  On-going      OT LONG TERM GOAL #5   Title  Pt will increase strength  in B hands for use during ADLs by 4#.    Time  12    Period  Weeks    Status  Achieved      OT LONG TERM GOAL #6   Title  Pt will increase strength 2# in B pinch for lateral and 3 point for use during ADLs.    Baseline  R 4# L 3#    Time  12    Period  Weeks    Status  Achieved            Plan - 11/18/18 1134    Clinical Impression Statement  Patient has made significant progress over the last sessions with grip and pinch strength, her right pinch increased by 11# in this time frame and 12# on the left.  Pinch has also improved and patient has seen improvements with her self care tasks, her husband only has to help her with her shoes and AFOs, she is now able to don and doff her socks, negotiate clothing over hips.  She is pleased with her progress and continues to order and receive adaptive equipment that will help further her independence in self care and IADL tasks.  Patient continues to benefit from skilled OT services, continue to work towards goals in plan of care to maximize safety and independence in necessary daily  tasks.    OT Occupational Profile and History  Detailed Assessment- Review of Records and additional review of physical, cognitive, psychosocial history related to current functional performance    Occupational performance deficits (Please refer to evaluation for details):  ADL's;IADL's;Education;Leisure    Body Structure / Function / Physical Skills  Strength;Gait;FMC;IADL;Balance;ADL;Coordination;Endurance;UE functional use;Mobility    Rehab Potential  Good    Clinical Decision Making  Several treatment options, min-mod task modification necessary    Comorbidities Affecting Occupational Performance:  Presence of comorbidities impacting occupational performance    Comorbidities impacting occupational performance description:  progressive muscle weakness, balance, coordination    Modification or Assistance to Complete Evaluation   Min-Moderate modification of tasks or assist with assess necessary to complete eval    OT Frequency  2x / week    OT Duration  12 weeks    OT Treatment/Interventions  Moist Heat;Self-care/ADL training;Therapeutic exercise;Patient/family education;Energy conservation;Therapist, nutritional;Therapeutic activities;Balance training;DME and/or AE instruction;Manual Therapy;Psychosocial skills training    Consulted and Agree with Plan of Care  Patient       Patient will benefit from skilled therapeutic intervention in order to improve the following deficits and impairments:   Body Structure / Function / Physical Skills: Strength, Gait, FMC, IADL, Balance, ADL, Coordination, Endurance, UE functional use, Mobility       Visit Diagnosis: Muscle weakness (generalized)  Other lack of coordination    Problem List Patient Active Problem List   Diagnosis Date Noted  . Vitamin D deficiency 10/29/2018  . Migraine without status migrainosus, not intractable   . Chronic pain syndrome   . Lumbar disc disease with radiculopathy 07/21/2017  . Lumbar foraminal stenosis  07/21/2017  . Unable to walk 07/21/2017  . Multiple falls 07/21/2017  . Displaced fracture of proximal end of right fibula 07/21/2017  . Lower extremity weakness 05/28/2017  . Right lower quadrant abdominal pain 08/22/2016  . Abnormal TSH 01/10/2016  . Morbid obesity ([place]) 01/12/2014  . Routine general medical examination at a health care facility 10/28/2012  . GERD (gastroesophageal reflux disease) 10/28/2012  . Anxiety 12/20/2011  . Allergic rhinitis 12/20/2011  . Asthma 12/20/2011  . Lapband APL with HH repair 03/31/2011  . Uncontrolled type 2 diabetes mellitus with hyperglycemia, with long-term current use of insulin ([place]) 06/30/2007  . Hyperlipidemia 06/30/2007  . Essential hypertension 06/27/2007   Achilles Dunk, OTR/L, CLT  Grayson Pfefferle 11/18/2018, 3:19 PM  [place] MAIN [facility] SERVICES [address] [place], [place], [address] Phone: [phone]   Fax:  [phone]  Name: MARCHELLE HUISH MRN: [id number] Date of Birth: 1965-12-18

## 2018-11-20 ENCOUNTER — Encounter: Payer: Self-pay | Admitting: Physical Therapy

## 2018-11-20 ENCOUNTER — Ambulatory Visit: Payer: BC Managed Care – PPO | Admitting: Occupational Therapy

## 2018-11-20 ENCOUNTER — Other Ambulatory Visit: Payer: Self-pay

## 2018-11-20 ENCOUNTER — Ambulatory Visit: Payer: BC Managed Care – PPO | Attending: Primary Care | Admitting: Physical Therapy

## 2018-11-20 DIAGNOSIS — M6281 Muscle weakness (generalized): Secondary | ICD-10-CM

## 2018-11-20 DIAGNOSIS — M25571 Pain in right ankle and joints of right foot: Secondary | ICD-10-CM | POA: Diagnosis present

## 2018-11-20 DIAGNOSIS — R278 Other lack of coordination: Secondary | ICD-10-CM | POA: Diagnosis present

## 2018-11-20 DIAGNOSIS — M25551 Pain in right hip: Secondary | ICD-10-CM | POA: Insufficient documentation

## 2018-11-20 DIAGNOSIS — R29898 Other symptoms and signs involving the musculoskeletal system: Secondary | ICD-10-CM | POA: Diagnosis present

## 2018-11-20 DIAGNOSIS — R262 Difficulty in walking, not elsewhere classified: Secondary | ICD-10-CM | POA: Insufficient documentation

## 2018-11-20 NOTE — Therapy (Signed)
[place] MAIN [facility] SERVICES [address] [place], [place], [address] Phone: [phone]   Fax:  [phone]  Physical Therapy Treatment  Patient Details  Name: [name] MRN: [id number] Date of Birth: 15-Aug-1965 Referring Provider (PT): Cornell Barman   Encounter Date: 11/20/2018  PT End of Session - 11/20/18 1138    Visit Number  26    Number of Visits  41    Date for PT Re-Evaluation  12/31/18    PT Start Time  1150    PT Stop Time  1230    PT Time Calculation (min)  40 min    Equipment Utilized During Treatment  Gait belt;Other (comment)   AFO   Activity Tolerance  Patient tolerated treatment well;Patient limited by fatigue    Behavior During Therapy  [facility] for tasks assessed/performed       Past Medical History:  Diagnosis Date  . Allergy   . Amyotrophic lateral sclerosis (ALS) ([place]) 09/23/2018  . Anemia   . Anxiety   . Arthritis   . Asthma   . Colon polyp   . Complication of anesthesia   . Diabetes mellitus   . Edema   . Fatigue   . Hypertension   . IBS (irritable bowel syndrome)   . Lump in female breast   . Methicillin resistant Staphylococcus aureus in conditions classified elsewhere and of unspecified site   . Migraines   . Morbid obesity ([place])   . Night sweats   . Nonspecific abnormal results of thyroid function study   . Other B-complex deficiencies   . Paresthesias   . PONV (postoperative nausea and vomiting)   . Pure hypercholesterolemia   . Reflux   . Sacral fracture ([place])   . Sinus problem   . Symptomatic states associated with artificial menopause   . Thyroid disease   . Type II or unspecified type diabetes mellitus without mention of complication, not stated as uncontrolled   . Unspecified sleep apnea     Past Surgical History:  Procedure Laterality Date  . abcess removal  1997   MRSA  . ABDOMINAL HYSTERECTOMY  1997   complete in 1997, partial was in 1995  . carpal tunnel release r  07/03/13    Dalldorf  . [place]  . CHOLECYSTECTOMY  03/1989  . COLONOSCOPY  03/21/2011   Normal.  [facility].  [name] KNEE SURGERY  2006  . LAPAROSCOPIC GASTRIC BANDING  05/2009  . TONSILLECTOMY AND ADENOIDECTOMY  1974  . TUBAL LIGATION    . ULNAR TUNNEL RELEASE Right 02/05/2018   Procedure: CUBITAL TUNNEL RELEASE/DECOMPRESSION;  Surgeon: [place] Nakayama, MD;  Location: [place];  Service: Orthopedics;  Laterality: Right;    There were no vitals filed for this visit.  Subjective Assessment - 11/20/18 1137    Subjective  Patient reports she has been doing well, No new concerns    Patient is accompained by:  Family member    Pertinent History   Patient fx her sacrum 9/18 and  she lost sensation to right foot, and she lost ability to DF her right foot. Patient had back surgery 03/2017 , she was sent home. She was walking with no device indoors and she could get out of the house and do the steps. June 18, 2017  her LLE was getting weaker and the right leg was getting stronger. Patient had follow up with neurosurgery. She was scheduled for a steriod shot Jul 23, 2017,  but on May 3 her legs didnt  feel right, she fell 4 times, she fractured her R fibula  on may 3rd, she went to hospital and spent one week. She went to SNF for 1 month. She had another neuro consult, resulting in kDx of  polyneuropathy. Then she had an apt with and [facility] said it was not polyneuropathy, (in [place])  She has a second consultation scheduled Dec 30 for a  Neuromuscular.consult.  She had a scan of the spine T 12-28-17  who then sent her to a thoracic surgeon at the end of November and was recommended to not due surgery. She does not know what her Dx is and she has been having home PT beginning August 25, 2017 . She has had HHPT 2 x  week for the last 5 months. She is able to walk 50 feet with RW. She is able to transfer from wc to stand independently.     How long can you stand comfortably?  5 mins    Patient Stated Goals  to  walk without the RW, or walk better, use the bathroom    Currently in Pain?  No/denies    Pain Score  0-No pain    Pain Onset  In the past 7 days          Treatment: Nu-step x 10 mins L 4 , cues to bring left leg to neutral  Side stepping with YTB x 4 with single HHA Hip extension BLE 10 x 2  Hip abd BLE 10 x 2  Hip extension BLE 10 x 2  Sit to stand from chair x 5 reps, CGA to hold the chair Marching high x 20 , standing BLE Patient attempts to side step with 1 UE support  Stretching of hamstring x 30 sec x 3, stretching of heel cords x 30 sec x 3  Pt educated  about  posture and technique with exercises to improve exercise technique, movement at target joints, with min  verbal, visual, tactile cues.                      PT Education - 11/20/18 1138    Education provided  Yes    Education Details  HEP    Person(s) Educated  Patient    Methods  Explanation    Comprehension  Returned demonstration;Tactile cues required;Need further instruction       PT Short Term Goals - 10/23/18 1159      PT SHORT TERM GOAL #1   Title  Patient will be independent in home exercise program to improve strength/mobility for better functional independence with ADLs.    Time  6    Period  Weeks    Status  Partially Met    Target Date  09/10/18        PT Long Term Goals - 11/04/18 1513      PT LONG TERM GOAL #1   Title  Patient will increase 10 meter walk test to >.50 m/s as to improve gait speed for better community ambulation and to reduce fall risk.    Baseline  . 36 m/sec,, 10/23/18 =.43 m/ sec    Time  8    Period  Weeks    Status  Partially Met    Target Date  12/31/18      PT LONG TERM GOAL #2   Title  Patient will transfer sit to stand  from 19 inch mat without assist or anyone  supporting AD  to demonstrate improved LE strength to decrease falls risk.     Time  12    Period  Weeks    Status  Achieved      PT LONG TERM GOAL #3   Title  Patient will  demonstrate stance without AD  x 1 min to demonstrate decreased falls risk.     Baseline  10/23/18 =    Time  12    Period  Weeks    Status  Achieved    Target Date  12/31/18      PT LONG TERM GOAL #4   Title  Patient will  ambulate 500 feet to work towards community ambulation distances.     Baseline  270 feet, 10/23/18= 305 feet    Time  12    Period  Weeks    Status  Partially Met    Target Date  12/31/18      PT LONG TERM GOAL #5   Title  Patient (< 57 years old) will complete five times sit to stand test in < 10 seconds indicating an increased LE strength and improved balance.    Baseline  20.28 m/sec. 10/23/18 =16.82 ( from green chair)    Time  12    Period  Weeks    Status  Partially Met    Target Date  12/31/18      Additional Long Term Goals   Additional Long Term Goals  Yes      PT LONG TERM GOAL #6   Title  Patient will report a worst pain of 3/10 on VAS in left knee             to improve tolerance with ADLs and reduced symptoms with activities.    Time  12    Period  Weeks    Status  New    Target Date  12/31/18            Plan - 11/20/18 1308    Clinical Impression Statement  Pt requires direction and verbal cues for correct performance of strengthening exercises. Patient has fatigue with endurance and difficulty with longer standing tasks.  Patient struggles with the ability to perform many repetitions due to weakness, as well as balance with mobility.  Pt encouraged continuing HEP   Patient will benefit from continued skilled PT to improve mobility and safety.    Rehab Potential  Good    Clinical Impairments Affecting Rehab Potential  Multiple tests concerning for neurologic involvement     PT Frequency  2x / week    PT Duration  12 weeks    PT Treatment/Interventions  Gait training;Therapeutic activities;Therapeutic exercise;Balance training;Neuromuscular re-education;Patient/family education;Orthotic Fit/Training;Manual techniques    PT Next Visit Plan  get  updated feedback on pain after leg press    PT Home Exercise Plan  No updates this date     Consulted and Agree with Plan of Care  Patient       Patient will benefit from skilled therapeutic intervention in order to improve the following deficits and impairments:  Decreased balance, Decreased endurance, Decreased mobility, Difficulty walking, Impaired sensation, Decreased range of motion, Decreased activity tolerance, Decreased coordination, Decreased strength, Pain, Postural dysfunction  Visit Diagnosis: Muscle weakness (generalized)  Other lack of coordination  Difficulty in walking, not elsewhere classified  Pain in right ankle and joints of right foot  Pain in right hip  Weakness of right leg     Problem List Patient Active Problem  List   Diagnosis Date Noted  . Vitamin D deficiency 10/29/2018  . Migraine without status migrainosus, not intractable   . Chronic pain syndrome   . Lumbar disc disease with radiculopathy 07/21/2017  . Lumbar foraminal stenosis 07/21/2017  . Unable to walk 07/21/2017  . Multiple falls 07/21/2017  . Displaced fracture of proximal end of right fibula 07/21/2017  . Lower extremity weakness 05/28/2017  . Right lower quadrant abdominal pain 08/22/2016  . Abnormal TSH 01/10/2016  . Morbid obesity ([place]) 01/12/2014  . Routine general medical examination at a health care facility 10/28/2012  . GERD (gastroesophageal reflux disease) 10/28/2012  . Anxiety 12/20/2011  . Allergic rhinitis 12/20/2011  . Asthma 12/20/2011  . Lapband APL with HH repair 03/31/2011  . Uncontrolled type 2 diabetes mellitus with hyperglycemia, with long-term current use of insulin ([place]) 06/30/2007  . Hyperlipidemia 06/30/2007  . Essential hypertension 06/27/2007    Alanson Puls, PT DPT 11/20/2018, 1:10 PM  [place] MAIN [facility] SERVICES [address] [place], [place], [address] Phone: [phone]   Fax:   [phone]  Name: YARELLY KUBA MRN: [id number] Date of Birth: 04/27/1965

## 2018-11-21 ENCOUNTER — Other Ambulatory Visit: Payer: Self-pay | Admitting: Primary Care

## 2018-11-21 DIAGNOSIS — R238 Other skin changes: Secondary | ICD-10-CM

## 2018-11-21 NOTE — Therapy (Signed)
[name] MAIN [facility] SERVICES [address] [place], [place], [address] Phone: [phone]   Fax:  [phone]  Occupational Therapy Treatment  Patient Details  Name: [name] MRN: [id number] Date of Birth: [birth year] Referring Provider (OT): Alma Friendly, MD   Encounter Date: 11/20/2018  OT End of Session - 11/21/18 1014    Visit Number  11    Number of Visits  24    Authorization Type  [employer] progress report period starting 09-24-18    OT Start Time  1100    OT Stop Time  1144    OT Time Calculation (min)  44 min    Activity Tolerance  Patient tolerated treatment well    Behavior During Therapy  [facility] for tasks assessed/performed       Past Medical History:  Diagnosis Date  . Allergy   . Amyotrophic lateral sclerosis (ALS) ([place]) 09/23/2018  . Anemia   . Anxiety   . Arthritis   . Asthma   . Colon polyp   . Complication of anesthesia   . Diabetes mellitus   . Edema   . Fatigue   . Hypertension   . IBS (irritable bowel syndrome)   . Lump in female breast   . Methicillin resistant Staphylococcus aureus in conditions classified elsewhere and of unspecified site   . Migraines   . Morbid obesity ([place])   . Night sweats   . Nonspecific abnormal results of thyroid function study   . Other B-complex deficiencies   . Paresthesias   . PONV (postoperative nausea and vomiting)   . Pure hypercholesterolemia   . Reflux   . Sacral fracture ([place])   . Sinus problem   . Symptomatic states associated with artificial menopause   . Thyroid disease   . Type II or unspecified type diabetes mellitus without mention of complication, not stated as uncontrolled   . Unspecified sleep apnea     Past Surgical History:  Procedure Laterality Date  . abcess removal  1997   MRSA  . ABDOMINAL HYSTERECTOMY  1997   complete in 1997, partial was in 1995  . carpal tunnel release r  07/03/13   Dal[place]orf  . [place]  .  CHOLECYSTECTOMY  03/1989  . COLONOSCOPY  03/21/2011   Normal.  [facility].  [name] KNEE SURGERY  2006  . LAPAROSCOPIC GASTRIC BANDING  05/2009  . TONSILLECTOMY AND ADENOIDECTOMY  1974  . TUBAL LIGATION    . ULNAR TUNNEL RELEASE Right 02/05/2018   Procedure: CUBITAL TUNNEL RELEASE/DECOMPRESSION;  Surgeon: Melrose Nakayama, MD;  Location: [place];  Service: Orthopedics;  Laterality: Right;    There were no vitals filed for this visit.  Subjective Assessment - 11/21/18 1013    Subjective   Patient reports she is doing well and is pleased with her progress.  Her husband just had surgery on his eye for a detached retina and is unable to drive so she is trying to work out transportation for next week to get to therapy.    Pertinent History  [name] was recently diagnosed with ALS at [facility].  She has had a recent decrease in strength in L side of body, UE and LE with decreased coordination.  She feels like she is dropping cups more and needs to use both hands for hand to mouth and handwriting has decreased to almost illegible.    Limitations  weakness    Patient Stated Goals  I want to work on handwriting, being able to hold a cup again and regain strength and coordination in both hands.    Currently in Pain?  No/denies    Pain Score  0-No pain       Therapeutic Exercise: Patient seen for bilateral UE strengthening with use of 2# dowel weight for shoulder flexion, ABD, ADD, chest press, forwards and backwards circles, diagonal patterns.  Therapist demo of exercises and occasional cues provided for form and technique.   Additional strengthening exercises with 1# wrist weights bilaterally combined with reaching task to pick up and place items in elevated plane of motion from seated position.  Performed bilaterally with alternating use of arms.  Neuromuscular Reeducation:  Patient seen for manipulation skills for checker sized objects to pick up from tabletop, move to palm using translatory  movements of the hand and then moving back to fingertips with cues for prehension patterns.   Patient also performing Unknotting exercises, difficulty with tighter knots and has to work more to undo.  She does not have long fingernails and feels it is difficult to get a grip onto the knot.   Response to tx: Patient continues to make progress and is showing good gains in strength bilaterally.  Has progressed to doing more with weights to provide increased resistance when performing tasks. She still requires occasional short rest breaks during activities.  Cues required for proper form and technique during exercises especially at the shoulder to decrease substitution of movements or leaning to compensate for decreased strength.  Continue to work towards goals in plan of care to increase independence and safety with daily tasks.                      OT Education - 11/21/18 1014    Education Details  reaching, translatory skills of the hand, using the hand for storage    Person(s) Educated  Patient    Methods  Explanation;Demonstration;Verbal cues    Comprehension  Verbalized understanding;Returned demonstration          OT Long Term Goals - 11/18/18 1134      OT LONG TERM GOAL #1   Title  Pt will complete self feeding using adaptive utensils and compenstory techniques using R hand.    Baseline  Pt needs help with holding cup, cutting meat and uses L hand as assist.    Time  12    Period  Weeks    Status  On-going      OT LONG TERM GOAL #2   Title  Pt will complete LB dressing using adaptive equipment with min assist and cues in unsupported sitting.    Baseline  Now able to do her socks, pants but needs help with AFOs and shoes 11/18/18    Time  12    Period  Weeks    Status  On-going      OT LONG TERM GOAL #3   Title  Pt will complete toileting and hygiene with adaptive aid with min assist and cues on elevated toilet with use of grab bar and FWW for balance.     Baseline  Pt unable to complete hygiene after BM; able to complete Independently after urinating.    Time  12    Period  Weeks    Status  On-going      OT LONG TERM GOAL #4   Title  Pt will be educated in HEP for strengtening and fine motor coordination.  Baseline  Pt does not have any HEP for BUEs or hands.    Time  12    Period  Weeks    Status  On-going      OT LONG TERM GOAL #5   Title  Pt will increase strength  in B hands for use during ADLs by 4#.    Time  12    Period  Weeks    Status  Achieved      OT LONG TERM GOAL #6   Title  Pt will increase strength 2# in B pinch for lateral and 3 point for use during ADLs.    Baseline  R 4# L 3#    Time  12    Period  Weeks    Status  Achieved            Plan - 11/21/18 1015    Clinical Impression Statement  Patient continues to make progress and is showing good gains in strength bilaterally.  Has progressed to doing more with weights to provide increased resistance when performing tasks. She still requires occasional short rest breaks during activities.  Cues required for proper form and technique during exercises especially at the shoulder to decrease substitution of movements or leaning to compensate for decreased strength.  Continue to work towards goals in plan of care to increase independence and safety with daily tasks.    OT Occupational Profile and History  Detailed Assessment- Review of Records and additional review of physical, cognitive, psychosocial history related to current functional performance    Occupational performance deficits (Please refer to evaluation for details):  ADL's;IADL's;Education;Leisure    Body Structure / Function / Physical Skills  Strength;Gait;FMC;IADL;Balance;ADL;Coordination;Endurance;UE functional use;Mobility    Rehab Potential  Good    Clinical Decision Making  Several treatment options, min-mod task modification necessary    Comorbidities Affecting Occupational Performance:  Presence  of comorbidities impacting occupational performance    Modification or Assistance to Complete Evaluation   Min-Moderate modification of tasks or assist with assess necessary to complete eval    OT Frequency  2x / week    OT Duration  12 weeks    OT Treatment/Interventions  Moist Heat;Self-care/ADL training;Therapeutic exercise;Patient/family education;Energy conservation;Therapist, nutritional;Therapeutic activities;Balance training;DME and/or AE instruction;Manual Therapy;Psychosocial skills training    Plan  Rec continued OT 2x per week for 12 weeks    Consulted and Agree with Plan of Care  Patient       Patient will benefit from skilled therapeutic intervention in order to improve the following deficits and impairments:   Body Structure / Function / Physical Skills: Strength, Gait, FMC, IADL, Balance, ADL, Coordination, Endurance, UE functional use, Mobility       Visit Diagnosis: Muscle weakness (generalized)  Other lack of coordination    Problem List Patient Active Problem List   Diagnosis Date Noted  . Vitamin D deficiency 10/29/2018  . Migraine without status migrainosus, not intractable   . Chronic pain syndrome   . Lumbar disc disease with radiculopathy 07/21/2017  . Lumbar foraminal stenosis 07/21/2017  . Unable to walk 07/21/2017  . Multiple falls 07/21/2017  . Displaced fracture of proximal end of right fibula 07/21/2017  . Lower extremity weakness 05/28/2017  . Right lower quadrant abdominal pain 08/22/2016  . Abnormal TSH 01/10/2016  . Morbid obesity ([place]) 01/12/2014  . Routine general medical examination at a health care facility 10/28/2012  . GERD (gastroesophageal reflux disease) 10/28/2012  . Anxiety 12/20/2011  . Allergic rhinitis 12/20/2011  .  Asthma 12/20/2011  . Lapband APL with HH repair 03/31/2011  . Uncontrolled type 2 diabetes mellitus with hyperglycemia, with long-term current use of insulin ([place]) 06/30/2007  . Hyperlipidemia 06/30/2007   . Essential hypertension 06/27/2007   [name], OTR/L, CLT  [name] 11/21/2018, 10:16 AM  [place] MAIN [facility] SERVICES [address] [place], [place], [address] Phone: [phone]   Fax:  [phone]  Name: [name] MRN: [id number] Date of Birth: 1965-08-09

## 2018-11-28 ENCOUNTER — Other Ambulatory Visit: Payer: Self-pay

## 2018-11-28 ENCOUNTER — Encounter: Payer: Self-pay | Admitting: Occupational Therapy

## 2018-11-28 ENCOUNTER — Ambulatory Visit: Payer: BC Managed Care – PPO | Admitting: Physical Therapy

## 2018-11-28 ENCOUNTER — Ambulatory Visit: Payer: BC Managed Care – PPO | Admitting: Occupational Therapy

## 2018-11-28 ENCOUNTER — Encounter: Payer: Self-pay | Admitting: Physical Therapy

## 2018-11-28 DIAGNOSIS — M6281 Muscle weakness (generalized): Secondary | ICD-10-CM

## 2018-11-28 DIAGNOSIS — M25571 Pain in right ankle and joints of right foot: Secondary | ICD-10-CM

## 2018-11-28 DIAGNOSIS — M25551 Pain in right hip: Secondary | ICD-10-CM

## 2018-11-28 DIAGNOSIS — R278 Other lack of coordination: Secondary | ICD-10-CM

## 2018-11-28 DIAGNOSIS — R262 Difficulty in walking, not elsewhere classified: Secondary | ICD-10-CM

## 2018-11-28 DIAGNOSIS — R29898 Other symptoms and signs involving the musculoskeletal system: Secondary | ICD-10-CM

## 2018-11-28 NOTE — Therapy (Signed)
[place] MAIN [facility] SERVICES [address] [place], [place], [address] Phone: [phone]   Fax:  [phone]  Physical Therapy Treatment  Patient Details  Name: [name] MRN: [id number] Date of Birth: October 23, 1965 Referring Provider (PT): Cornell Barman   Encounter Date: 11/28/2018  PT End of Session - 11/28/18 1130    Visit Number  27    Number of Visits  41    Date for PT Re-Evaluation  12/31/18    PT Start Time  1150    PT Stop Time  1230    PT Time Calculation (min)  40 min    Equipment Utilized During Treatment  Gait belt;Other (comment)   AFO   Activity Tolerance  Patient tolerated treatment well;Patient limited by fatigue    Behavior During Therapy  [facility] for tasks assessed/performed       Past Medical History:  Diagnosis Date  . Allergy   . Amyotrophic lateral sclerosis (ALS) ([place]) 09/23/2018  . Anemia   . Anxiety   . Arthritis   . Asthma   . Colon polyp   . Complication of anesthesia   . Diabetes mellitus   . Edema   . Fatigue   . Hypertension   . IBS (irritable bowel syndrome)   . Lump in female breast   . Methicillin resistant Staphylococcus aureus in conditions classified elsewhere and of unspecified site   . Migraines   . Morbid obesity ([place])   . Night sweats   . Nonspecific abnormal results of thyroid function study   . Other B-complex deficiencies   . Paresthesias   . PONV (postoperative nausea and vomiting)   . Pure hypercholesterolemia   . Reflux   . Sacral fracture ([place])   . Sinus problem   . Symptomatic states associated with artificial menopause   . Thyroid disease   . Type II or unspecified type diabetes mellitus without mention of complication, not stated as uncontrolled   . Unspecified sleep apnea     Past Surgical History:  Procedure Laterality Date  . abcess removal  1997   MRSA  . ABDOMINAL HYSTERECTOMY  1997   complete in 1997, partial was in 1995  . carpal tunnel release r   07/03/13   [place]al[place]orf  . [place]  . CHOLECYSTECTOMY  03/1989  . COLONOSCOPY  03/21/2011   Normal.  [facility].  [name] KNEE SURGERY  2006  . LAPAROSCOPIC GASTRIC BANDING  05/2009  . TONSILLECTOMY AND ADENOIDECTOMY  1974  . TUBAL LIGATION    . ULNAR TUNNEL RELEASE Right 02/05/2018   Procedure: CUBITAL TUNNEL RELEASE/DECOMPRESSION;  Surgeon: Melrose Nakayama, MD;  Location: [place];  Service: Orthopedics;  Laterality: Right;    There were no vitals filed for this visit.  Subjective Assessment - 11/28/18 1136    Subjective  Patient reports she has been doing well, No new concerns    Patient is accompained by:  Family member    Pertinent History   Patient fx her sacrum 9/18 and  she lost sensation to right foot, and she lost ability to DF her right foot. Patient had back surgery 03/2017 , she was sent home. She was walking with no device indoors and she could get out of the house and do the steps. June 18, 2017  her LLE was getting weaker and the right leg was getting stronger. Patient had follow up with neurosurgery. She was scheduled for a steriod shot Jul 23, 2017,  but on May 3 her legs didnt  feel right, she fell 4 times, she fractured her R fibula  on may 3rd, she went to hospital and spent one week. She went to SNF for 1 month. She had another neuro consult, resulting in kDx of  polyneuropathy. Then she had an apt with and [facility] said it was not polyneuropathy, (in [place])  She has a second consultation scheduled Dec 30 for a  Neuromuscular.consult.  She had a scan of the spine T 12-28-17  who then sent her to a thoracic surgeon at the end of November and was recommended to not due surgery. She does not know what her Dx is and she has been having home PT beginning August 25, 2017 . She has had HHPT 2 x  week for the last 5 months. She is able to walk 50 feet with RW. She is able to transfer from wc to stand independently.     How long can you stand comfortably?  5 mins    Patient Stated  Goals  to walk without the RW, or walk better, use the bathroom    Pain Onset  In the past 7 days       Treatment:  Transfer training wc<> mat using RW from wc level and various mat heights x 4 with CGA  Standing balance training with RW in front and mat behind and reaching activities with different heights and distances with LUE and RUE and intermittent UE support to RW x 6 mins, 5 mins and 3 mins   Patient gets fatigued with standing balance training and is able to stand with no support from back of her legs.                        PT Education - 11/28/18 1129    Education provided  Yes    Education Details  HEP    Person(s) Educated  Patient    Methods  Explanation    Comprehension  Verbalized understanding;Returned demonstration;Verbal cues required;Tactile cues required;Need further instruction       PT Short Term Goals - 10/23/18 1159      PT SHORT TERM GOAL #1   Title  Patient will be independent in home exercise program to improve strength/mobility for better functional independence with ADLs.    Time  6    Period  Weeks    Status  Partially Met    Target Date  09/10/18        PT Long Term Goals - 11/04/18 1513      PT LONG TERM GOAL #1   Title  Patient will increase 10 meter walk test to >.50 m/s as to improve gait speed for better community ambulation and to reduce fall risk.    Baseline  . 36 m/sec,, 10/23/18 =.43 m/ sec    Time  8    Period  Weeks    Status  Partially Met    Target Date  12/31/18      PT LONG TERM GOAL #2   Title  Patient will transfer sit to stand  from 19 inch mat without assist or anyone supporting AD  to demonstrate improved LE strength to decrease falls risk.     Time  12    Period  Weeks    Status  Achieved      PT LONG TERM GOAL #3   Title  Patient will demonstrate stance without AD  x 1 min  to demonstrate decreased falls risk.     Baseline  10/23/18 =    Time  12    Period  Weeks    Status  Achieved     Target Date  12/31/18      PT LONG TERM GOAL #4   Title  Patient will  ambulate 500 feet to work towards community ambulation distances.     Baseline  270 feet, 10/23/18= 305 feet    Time  12    Period  Weeks    Status  Partially Met    Target Date  12/31/18      PT LONG TERM GOAL #5   Title  Patient (< 22 years old) will complete five times sit to stand test in < 10 seconds indicating an increased LE strength and improved balance.    Baseline  20.28 m/sec. 10/23/18 =16.82 ( from green chair)    Time  12    Period  Weeks    Status  Partially Met    Target Date  12/31/18      Additional Long Term Goals   Additional Long Term Goals  Yes      PT LONG TERM GOAL #6   Title  Patient will report a worst pain of 3/10 on VAS in left knee             to improve tolerance with ADLs and reduced symptoms with activities.    Time  12    Period  Weeks    Status  New    Target Date  12/31/18            Plan - 11/28/18 1131    Clinical Impression Statement  Patient   instructed in transfers from different heights and surfaces  with RW in standing. Patient performs intermediate LE exercises in supine and sitting open and closed chain. Patient performs standing activates to improve strength for static standing.   Patient will continue to benefit from skilled PT to improve strength, balance and gait. Patient continues to have LE weakness and decreased static and dynamic standing balance.    Rehab Potential  Good    Clinical Impairments Affecting Rehab Potential  Multiple tests concerning for neurologic involvement     PT Frequency  2x / week    PT Duration  12 weeks    PT Treatment/Interventions  Gait training;Therapeutic activities;Therapeutic exercise;Balance training;Neuromuscular re-education;Patient/family education;Orthotic Fit/Training;Manual techniques    PT Next Visit Plan  get updated feedback on pain after leg press    PT Home Exercise Plan  No updates this date     Consulted and Agree  with Plan of Care  Patient       Patient will benefit from skilled therapeutic intervention in order to improve the following deficits and impairments:  Decreased balance, Decreased endurance, Decreased mobility, Difficulty walking, Impaired sensation, Decreased range of motion, Decreased activity tolerance, Decreased coordination, Decreased strength, Pain, Postural dysfunction  Visit Diagnosis: Muscle weakness (generalized)  Other lack of coordination  Pain in right ankle and joints of right foot  Difficulty in walking, not elsewhere classified  Pain in right hip  Weakness of right leg     Problem List Patient Active Problem List   Diagnosis Date Noted  . Vitamin D deficiency 10/29/2018  . Migraine without status migrainosus, not intractable   . Chronic pain syndrome   . Lumbar disc disease with radiculopathy 07/21/2017  . Lumbar foraminal stenosis 07/21/2017  . Unable to walk 07/21/2017  .  Multiple falls 07/21/2017  . Displaced fracture of proximal end of right fibula 07/21/2017  . Lower extremity weakness 05/28/2017  . Right lower quadrant abdominal pain 08/22/2016  . Abnormal TSH 01/10/2016  . Morbid obesity ([place]) 01/12/2014  . Routine general medical examination at a health care facility 10/28/2012  . GERD (gastroesophageal reflux disease) 10/28/2012  . Anxiety 12/20/2011  . Allergic rhinitis 12/20/2011  . Asthma 12/20/2011  . Lapband APL with HH repair 03/31/2011  . Uncontrolled type 2 diabetes mellitus with hyperglycemia, with long-term current use of insulin ([place]) 06/30/2007  . Hyperlipidemia 06/30/2007  . Essential hypertension 06/27/2007    Alanson Puls, PT DPT 11/28/2018, 1:07 PM  [place] MAIN [facility] SERVICES [address] [place], [place], [address] Phone: [phone]   Fax:  [phone]  Name: [name] MRN: [id number] Date of Birth: [birth year]

## 2018-11-28 NOTE — Therapy (Signed)
[place] MAIN [facility] SERVICES [address] [place], [place], [address] Phone: [phone]   Fax:  [phone]  Occupational Therapy Treatment  Patient Details  Name: [name] MRN: [id number] Date of Birth: 09-Aug-1965 Referring Provider (OT): Alma Friendly, MD   Encounter Date: 11/28/2018  OT End of Session - 11/28/18 1836    Visit Number  12    Number of Visits  24    Date for OT Re-Evaluation  12/17/18    Authorization Type  [employer] progress report period starting 09-24-18    Authorization Time Period  09/24/18-12/17/18    OT Start Time  1105    OT Stop Time  1145    OT Time Calculation (min)  40 min    Activity Tolerance  Patient tolerated treatment well       Past Medical History:  Diagnosis Date  . Allergy   . Amyotrophic lateral sclerosis (ALS) ([place]) 09/23/2018  . Anemia   . Anxiety   . Arthritis   . Asthma   . Colon polyp   . Complication of anesthesia   . Diabetes mellitus   . Edema   . Fatigue   . Hypertension   . IBS (irritable bowel syndrome)   . Lump in female breast   . Methicillin resistant Staphylococcus aureus in conditions classified elsewhere and of unspecified site   . Migraines   . Morbid obesity ([place])   . Night sweats   . Nonspecific abnormal results of thyroid function study   . Other B-complex deficiencies   . Paresthesias   . PONV (postoperative nausea and vomiting)   . Pure hypercholesterolemia   . Reflux   . Sacral fracture ([place])   . Sinus problem   . Symptomatic states associated with artificial menopause   . Thyroid disease   . Type II or unspecified type diabetes mellitus without mention of complication, not stated as uncontrolled   . Unspecified sleep apnea     Past Surgical History:  Procedure Laterality Date  . abcess removal  1997   MRSA  . ABDOMINAL HYSTERECTOMY  1997   complete in 1997, partial was in 1995  . carpal tunnel release r  07/03/13   Dalldorf  . [place]  . CHOLECYSTECTOMY  03/1989  . COLONOSCOPY  03/21/2011   Normal.  [facility]/Hayes.  [name] KNEE SURGERY  2006  . LAPAROSCOPIC GASTRIC BANDING  05/2009  . TONSILLECTOMY AND ADENOIDECTOMY  1974  . TUBAL LIGATION    . ULNAR TUNNEL RELEASE Right 02/05/2018   Procedure: CUBITAL TUNNEL RELEASE/DECOMPRESSION;  Surgeon: Melrose Nakayama, MD;  Location: [place];  Service: Orthopedics;  Laterality: Right;    There were no vitals filed for this visit.  Subjective Assessment - 11/28/18 1834    Subjective   Pt. reports that her daughter went back to [place]    Patient is accompanied by:  Family member    Pertinent History  [name] was recently diagnosed with ALS at [place] [facility].  She has had a recent decrease in strength in L side of body, UE and LE with decreased coordination.  She feels like she is dropping cups more and needs to use both hands for hand to mouth and handwriting has decreased to almost illegible.    Currently in Pain?  No/denies       OT TREATMENT     Neuro muscular re-education:  Pt. worked on [facility] skills grasping 1/2" pegs, translatory  movements of the hand moving objects from her palm to the tip of her 2nd digit, and thumb to place them into a small pegboard following a design pattern. Pt. Dropped several pegs between her fingers, and required visual demonstration, and cues to move the pegs successfully through her hand.  Therapeutic Exercise:  Pt. performed gross gripping with grip strengthener. Pt. worked on sustaining grip while grasping pegs, and placing them into containers set at the tabletop. Pt. tolerated 11.2# of grip strength force. Pt. Worked on pinch strengthening in the bilateral hand for lateral, and 3pt. pinch using yellow, red, green, and blue resistive clips. Cues were required for visual demonstration.                        OT Education - 11/28/18 1835    Education Details  UE strength, and [facility] skills    Person(s) Educated   Patient    Methods  Explanation;Demonstration;Verbal cues    Comprehension  Verbalized understanding;Returned demonstration          OT Long Term Goals - 11/18/18 1134      OT LONG TERM GOAL #1   Title  Pt will complete self feeding using adaptive utensils and compenstory techniques using R hand.    Baseline  Pt needs help with holding cup, cutting meat and uses L hand as assist.    Time  12    Period  Weeks    Status  On-going      OT LONG TERM GOAL #2   Title  Pt will complete LB dressing using adaptive equipment with min assist and cues in unsupported sitting.    Baseline  Now able to do her socks, pants but needs help with AFOs and shoes 11/18/18    Time  12    Period  Weeks    Status  On-going      OT LONG TERM GOAL #3   Title  Pt will complete toileting and hygiene with adaptive aid with min assist and cues on elevated toilet with use of grab bar and FWW for balance.    Baseline  Pt unable to complete hygiene after BM; able to complete Independently after urinating.    Time  12    Period  Weeks    Status  On-going      OT LONG TERM GOAL #4   Title  Pt will be educated in HEP for strengtening and fine motor coordination.    Baseline  Pt does not have any HEP for BUEs or hands.    Time  12    Period  Weeks    Status  On-going      OT LONG TERM GOAL #5   Title  Pt will increase strength  in B hands for use during ADLs by 4#.    Time  12    Period  Weeks    Status  Achieved      OT LONG TERM GOAL #6   Title  Pt will increase strength 2# in B pinch for lateral and 3 point for use during ADLs.    Baseline  R 4# L 3#    Time  12    Period  Weeks    Status  Achieved            Plan - 11/28/18 1836    Clinical Impression Statement  Pt. reports that her daughter has returned to [place], and that her husband has now  resumed driving. Pt. ordered a long handled razor that she uses with success, and is now able to shave her legs independently. Pt. continues to  work on improving UE strength, grip strength, and [place] skills, as well as provide edcuation about adaptive techniques, and work simplification techniques during ADLs, and IADLs.    OT Occupational Profile and History  Detailed Assessment- Review of Records and additional review of physical, cognitive, psychosocial history related to current functional performance    Occupational performance deficits (Please refer to evaluation for details):  ADL's;IADL's;Education;Leisure    Body Structure / Function / Physical Skills  Strength;Gait;FMC;IADL;Balance;ADL;Coordination;Endurance;UE functional use;Mobility    Rehab Potential  Good    Clinical Decision Making  Several treatment options, min-mod task modification necessary    Comorbidities Affecting Occupational Performance:  Presence of comorbidities impacting occupational performance    Modification or Assistance to Complete Evaluation   Min-Moderate modification of tasks or assist with assess necessary to complete eval    OT Frequency  2x / week    OT Duration  12 weeks    OT Treatment/Interventions  Moist Heat;Self-care/ADL training;Therapeutic exercise;Patient/family education;Energy conservation;Therapist, nutritional;Therapeutic activities;Balance training;DME and/or AE instruction;Manual Therapy;Psychosocial skills training    Plan  Rec continued OT 2x per week for 12 weeks    Consulted and Agree with Plan of Care  Patient       Patient will benefit from skilled therapeutic intervention in order to improve the following deficits and impairments:   Body Structure / Function / Physical Skills: Strength, Gait, FMC, IADL, Balance, ADL, Coordination, Endurance, UE functional use, Mobility       Visit Diagnosis: Muscle weakness (generalized)  Other lack of coordination    Problem List Patient Active Problem List   Diagnosis Date Noted  . Vitamin D deficiency 10/29/2018  . Migraine without status migrainosus, not intractable   .  Chronic pain syndrome   . Lumbar disc disease with radiculopathy 07/21/2017  . Lumbar foraminal stenosis 07/21/2017  . Unable to walk 07/21/2017  . Multiple falls 07/21/2017  . Displaced fracture of proximal end of right fibula 07/21/2017  . Lower extremity weakness 05/28/2017  . Right lower quadrant abdominal pain 08/22/2016  . Abnormal TSH 01/10/2016  . Morbid obesity ([place]) 01/12/2014  . Routine general medical examination at a health care facility 10/28/2012  . GERD (gastroesophageal reflux disease) 10/28/2012  . Anxiety 12/20/2011  . Allergic rhinitis 12/20/2011  . Asthma 12/20/2011  . Lapband APL with HH repair 03/31/2011  . Uncontrolled type 2 diabetes mellitus with hyperglycemia, with long-term current use of insulin ([place]) 06/30/2007  . Hyperlipidemia 06/30/2007  . Essential hypertension 06/27/2007    Harrel Carina, MS, OTR/L 11/28/2018, 6:48 PM  [place] MAIN [facility] SERVICES [address] [place], [place], [address] Phone: [phone]   Fax:  [phone]  Name: [name] MRN: [id number] Date of Birth: [birth year]

## 2018-12-02 ENCOUNTER — Ambulatory Visit: Payer: BC Managed Care – PPO | Admitting: Occupational Therapy

## 2018-12-02 ENCOUNTER — Ambulatory Visit: Payer: BC Managed Care – PPO | Admitting: Physical Therapy

## 2018-12-02 ENCOUNTER — Encounter: Payer: Self-pay | Admitting: Occupational Therapy

## 2018-12-02 ENCOUNTER — Encounter: Payer: Self-pay | Admitting: Physical Therapy

## 2018-12-02 ENCOUNTER — Other Ambulatory Visit: Payer: Self-pay

## 2018-12-02 DIAGNOSIS — M25571 Pain in right ankle and joints of right foot: Secondary | ICD-10-CM

## 2018-12-02 DIAGNOSIS — R262 Difficulty in walking, not elsewhere classified: Secondary | ICD-10-CM

## 2018-12-02 DIAGNOSIS — R278 Other lack of coordination: Secondary | ICD-10-CM

## 2018-12-02 DIAGNOSIS — R29898 Other symptoms and signs involving the musculoskeletal system: Secondary | ICD-10-CM

## 2018-12-02 DIAGNOSIS — M6281 Muscle weakness (generalized): Secondary | ICD-10-CM

## 2018-12-02 NOTE — Therapy (Signed)
[place] MAIN [facility] SERVICES [address] [place], [place], [address] Phone: [phone]   Fax:  [phone]  Physical Therapy Treatment  Patient Details  Name: [name] MRN: [id number] Date of Birth: 1966-02-11 Referring Provider (PT): Cornell Barman   Encounter Date: 12/02/2018  PT End of Session - 12/02/18 1317    Visit Number  28    Number of Visits  41    Date for PT Re-Evaluation  12/31/18    PT Start Time  0174    PT Stop Time  1230    PT Time Calculation (min)  45 min    Equipment Utilized During Treatment  Gait belt;Other (comment)   AFO   Activity Tolerance  Patient tolerated treatment well;Patient limited by fatigue    Behavior During Therapy  [facility] for tasks assessed/performed       Past Medical History:  Diagnosis Date  . Allergy   . Amyotrophic lateral sclerosis (ALS) ([place]) 09/23/2018  . Anemia   . Anxiety   . Arthritis   . Asthma   . Colon polyp   . Complication of anesthesia   . Diabetes mellitus   . Edema   . Fatigue   . Hypertension   . IBS (irritable bowel syndrome)   . Lump in female breast   . Methicillin resistant Staphylococcus aureus in conditions classified elsewhere and of unspecified site   . Migraines   . Morbid obesity ([place])   . Night sweats   . Nonspecific abnormal results of thyroid function study   . Other B-complex deficiencies   . Paresthesias   . PONV (postoperative nausea and vomiting)   . Pure hypercholesterolemia   . Reflux   . Sacral fracture ([place])   . Sinus problem   . Symptomatic states associated with artificial menopause   . Thyroid disease   . Type II or unspecified type diabetes mellitus without mention of complication, not stated as uncontrolled   . Unspecified sleep apnea     Past Surgical History:  Procedure Laterality Date  . abcess removal  1997   MRSA  . ABDOMINAL HYSTERECTOMY  1997   complete in 1997, partial was in 1995  . carpal tunnel release r   07/03/13   Dalldorf  . [place]  . CHOLECYSTECTOMY  03/1989  . COLONOSCOPY  03/21/2011   Normal.  [facility].  [name] KNEE SURGERY  2006  . LAPAROSCOPIC GASTRIC BANDING  05/2009  . TONSILLECTOMY AND ADENOIDECTOMY  1974  . TUBAL LIGATION    . ULNAR TUNNEL RELEASE Right 02/05/2018   Procedure: CUBITAL TUNNEL RELEASE/DECOMPRESSION;  Surgeon: Melrose Nakayama, MD;  Location: [place];  Service: Orthopedics;  Laterality: Right;    There were no vitals filed for this visit.  Subjective Assessment - 12/02/18 1316    Subjective  Patient reports she has been doing well, No new concerns. She needs to fix her short leg brace today and does not have them for therapy.    Patient is accompained by:  Family member    Pertinent History   Patient fx her sacrum 9/18 and  she lost sensation to right foot, and she lost ability to DF her right foot. Patient had back surgery 03/2017 , she was sent home. She was walking with no device indoors and she could get out of the house and do the steps. June 18, 2017  her LLE was getting weaker and the right leg was getting stronger.  Patient had follow up with neurosurgery. She was scheduled for a steriod shot Jul 23, 2017, but on May 3 her legs didnt  feel right, she fell 4 times, she fractured her R fibula  on may 3rd, she went to hospital and spent one week. She went to SNF for 1 month. She had another neuro consult, resulting in kDx of  polyneuropathy. Then she had an apt with and [facility] said it was not polyneuropathy, (in [place])  She has a second consultation scheduled Dec 30 for a  Neuromuscular.consult.  She had a scan of the spine T 12-28-17  who then sent her to a thoracic surgeon at the end of November and was recommended to not due surgery. She does not know what her Dx is and she has been having home PT beginning August 25, 2017 . She has had HHPT 2 x  week for the last 5 months. She is able to walk 50 feet with RW. She is able to transfer from wc to stand  independently.     How long can you stand comfortably?  5 mins    Patient Stated Goals  to walk without the RW, or walk better, use the bathroom    Currently in Pain?  No/denies    Pain Score  0-No pain    Pain Onset  In the past 7 days         Treatment: Prone knee flex LLE x 10 x 2, RLE attempted x 10 with very minimal movement Prone knee flex and hip extension LLE x 10 x 2, RLE AAROM x 10 x 2 sidelying hip abd x 10 x 2 , hip flex / ext x 15 BLE Bridges x 20  hooklying marching x 20 with 4 lbs SAQ with 4 lbs x 20 with 3 sec hold  Patient performs all exercises with minimal rest periods and has fatigue following each exercise.    Pt educated  about  posture and technique with exercises to improve exercise technique, movement at target joints, with min  verbal, visual, tactile cues.                   PT Education - 12/02/18 1316    Education provided  Yes    Education Details  HEP    Person(s) Educated  Patient    Methods  Explanation;Demonstration;Tactile cues;Verbal cues    Comprehension  Verbal cues required;Returned demonstration;Verbalized understanding;Tactile cues required;Need further instruction       PT Short Term Goals - 10/23/18 1159      PT SHORT TERM GOAL #1   Title  Patient will be independent in home exercise program to improve strength/mobility for better functional independence with ADLs.    Time  6    Period  Weeks    Status  Partially Met    Target Date  09/10/18        PT Long Term Goals - 11/04/18 1513      PT LONG TERM GOAL #1   Title  Patient will increase 10 meter walk test to >.50 m/s as to improve gait speed for better community ambulation and to reduce fall risk.    Baseline  . 36 m/sec,, 10/23/18 =.43 m/ sec    Time  8    Period  Weeks    Status  Partially Met    Target Date  12/31/18      PT LONG TERM GOAL #2   Title  Patient will transfer sit  to stand  from 19 inch mat without assist or anyone supporting AD  to  demonstrate improved LE strength to decrease falls risk.     Time  12    Period  Weeks    Status  Achieved      PT LONG TERM GOAL #3   Title  Patient will demonstrate stance without AD  x 1 min to demonstrate decreased falls risk.     Baseline  10/23/18 =    Time  12    Period  Weeks    Status  Achieved    Target Date  12/31/18      PT LONG TERM GOAL #4   Title  Patient will  ambulate 500 feet to work towards community ambulation distances.     Baseline  270 feet, 10/23/18= 305 feet    Time  12    Period  Weeks    Status  Partially Met    Target Date  12/31/18      PT LONG TERM GOAL #5   Title  Patient (< 10 years old) will complete five times sit to stand test in < 10 seconds indicating an increased LE strength and improved balance.    Baseline  20.28 m/sec. 10/23/18 =16.82 ( from green chair)    Time  12    Period  Weeks    Status  Partially Met    Target Date  12/31/18      Additional Long Term Goals   Additional Long Term Goals  Yes      PT LONG TERM GOAL #6   Title  Patient will report a worst pain of 3/10 on VAS in left knee             to improve tolerance with ADLs and reduced symptoms with activities.    Time  12    Period  Weeks    Status  New    Target Date  12/31/18            Plan - 12/02/18 1317    Clinical Impression Statement  Patient performs transfers without SLB's and therapeutic exercises. She has 1/5 right knee flex and right glut today. She has decreased strength in BLE and will continue to benefit from skiiled PT to improve mobility and safety.    Rehab Potential  Good    Clinical Impairments Affecting Rehab Potential  Multiple tests concerning for neurologic involvement     PT Frequency  2x / week    PT Duration  12 weeks    PT Treatment/Interventions  Gait training;Therapeutic activities;Therapeutic exercise;Balance training;Neuromuscular re-education;Patient/family education;Orthotic Fit/Training;Manual techniques    PT Next Visit Plan  get  updated feedback on pain after leg press    PT Home Exercise Plan  No updates this date     Consulted and Agree with Plan of Care  Patient       Patient will benefit from skilled therapeutic intervention in order to improve the following deficits and impairments:  Decreased balance, Decreased endurance, Decreased mobility, Difficulty walking, Impaired sensation, Decreased range of motion, Decreased activity tolerance, Decreased coordination, Decreased strength, Pain, Postural dysfunction  Visit Diagnosis: Other lack of coordination  Muscle weakness (generalized)  Pain in right ankle and joints of right foot  Difficulty in walking, not elsewhere classified  Weakness of right leg     Problem List Patient Active Problem List   Diagnosis Date Noted  . Vitamin D deficiency 10/29/2018  . Migraine without status migrainosus,  not intractable   . Chronic pain syndrome   . Lumbar disc disease with radiculopathy 07/21/2017  . Lumbar foraminal stenosis 07/21/2017  . Unable to walk 07/21/2017  . Multiple falls 07/21/2017  . Displaced fracture of proximal end of right fibula 07/21/2017  . Lower extremity weakness 05/28/2017  . Right lower quadrant abdominal pain 08/22/2016  . Abnormal TSH 01/10/2016  . Morbid obesity ([place]) 01/12/2014  . Routine general medical examination at a health care facility 10/28/2012  . GERD (gastroesophageal reflux disease) 10/28/2012  . Anxiety 12/20/2011  . Allergic rhinitis 12/20/2011  . Asthma 12/20/2011  . Lapband APL with HH repair 03/31/2011  . Uncontrolled type 2 diabetes mellitus with hyperglycemia, with long-term current use of insulin ([place]) 06/30/2007  . Hyperlipidemia 06/30/2007  . Essential hypertension 06/27/2007    Alanson Puls, PT DPT 12/02/2018, 1:21 PM  [place] MAIN [facility] SERVICES [address] [place], [place], [address] Phone: [phone]   Fax:  [phone]  Name: KATEE WENTLAND MRN: [id number] Date of Birth: 15-Jan-1966

## 2018-12-04 ENCOUNTER — Encounter: Payer: BC Managed Care – PPO | Admitting: Occupational Therapy

## 2018-12-04 ENCOUNTER — Ambulatory Visit: Payer: BC Managed Care – PPO | Admitting: Physical Therapy

## 2018-12-04 NOTE — Therapy (Signed)
[place] MAIN [facility] SERVICES [address] [place], [place], [address] Phone: [phone]   Fax:  [phone]  Occupational Therapy Treatment  Patient Details  Name: [name] MRN: [id number] Date of Birth: 02-07-1966 Referring Provider (OT): Alma Friendly, MD   Encounter Date: 12/02/2018  OT End of Session - 12/06/18 0847    Visit Number  13    Number of Visits  24    Date for OT Re-Evaluation  12/17/18    Authorization Type  BCBS progress report period starting 09-24-18    Authorization Time Period  09/24/18-12/17/18    OT Start Time  1100    OT Stop Time  1145    OT Time Calculation (min)  45 min    Activity Tolerance  Patient tolerated treatment well    Behavior During Therapy  [facility] for tasks assessed/performed       Past Medical History:  Diagnosis Date  . Allergy   . Amyotrophic lateral sclerosis (ALS) ([place]) 09/23/2018  . Anemia   . Anxiety   . Arthritis   . Asthma   . Colon polyp   . Complication of anesthesia   . Diabetes mellitus   . Edema   . Fatigue   . Hypertension   . IBS (irritable bowel syndrome)   . Lump in female breast   . Methicillin resistant Staphylococcus aureus in conditions classified elsewhere and of unspecified site   . Migraines   . Morbid obesity ([place])   . Night sweats   . Nonspecific abnormal results of thyroid function study   . Other B-complex deficiencies   . Paresthesias   . PONV (postoperative nausea and vomiting)   . Pure hypercholesterolemia   . Reflux   . Sacral fracture ([place])   . Sinus problem   . Symptomatic states associated with artificial menopause   . Thyroid disease   . Type II or unspecified type diabetes mellitus without mention of complication, not stated as uncontrolled   . Unspecified sleep apnea     Past Surgical History:  Procedure Laterality Date  . abcess removal  1997   MRSA  . ABDOMINAL HYSTERECTOMY  1997   complete in 1997, partial was in 1995  . carpal  tunnel release r  07/03/13   Dalldorf  . [place]  . CHOLECYSTECTOMY  03/1989  . COLONOSCOPY  03/21/2011   Normal.  [facility].  [name] KNEE SURGERY  2006  . LAPAROSCOPIC GASTRIC BANDING  05/2009  . TONSILLECTOMY AND ADENOIDECTOMY  1974  . TUBAL LIGATION    . ULNAR TUNNEL RELEASE Right 02/05/2018   Procedure: CUBITAL TUNNEL RELEASE/DECOMPRESSION;  Surgeon: Melrose Nakayama, MD;  Location: [facility];  Service: Orthopedics;  Laterality: Right;    There were no vitals filed for this visit.  Subjective Assessment - 12/06/18 0847    Subjective   Patient reports her day has gone terrible, lots of issues this morning and just happy to get to therapy.  After session she states, "This is actually relaxing to come here even though I am doing work, I feel better now just being here"She reports her leg braces were broken and she needs to figure out how to fix them, she couldn't wear them today and will not be able to do certain exercises with PT.    Pertinent History  [name] was recently diagnosed with ALS at [place] [facility].  She has had a recent decrease in strength in L  side of body, UE and LE with decreased coordination.  She feels like she is dropping cups more and needs to use both hands for hand to mouth and handwriting has decreased to almost illegible.    Patient Stated Goals  I want to work on handwriting, being able to hold a cup again and regain strength and coordination in both hands.    Currently in Pain?  No/denies    Pain Score  0-No pain       Therapeutic Exercise: Pt seen for finger strengthening with Web cando green resistance with therapist demo and cues for exercises for gripping, isolated finger and thumb exercises with cues. Yellow putty forming small balls right hand followed by pressing into balls for small finger extension, full finger extension difficulty with PIP flexion but DIP hyperextension Lumbricals impaired on right better on left Instructed on tendon  gliding exercises and issued written program and code to use at home.  Patient requires assistance on the right for MP flexion with PIP and DIP extension, multiple trials and repetitions completed with therapist providing blocking and assist to facilitate movements.    Medbridge HEP for tendon gliding exercises Access Code: [email or web address]  URL: [email or web address]   Response to tx:   Patient normally very cheerful at the beginning of session however she reports she had a difficult and stressful morning today including her leg braces being broken and is worried about getting them repaired and being able to fully participate in PT today.  By the end of the session, patient was more centered, relaxed and positive about the rest of her day, she reports working in therapy helped today to distract her and now she feels much better.  Patient engaging in ROM and strengthening exercises for bilateral UEs but with increased focus on right hand for tendon gliding exercises.  Patient was issued [place] code for exercises as well as a handout.  Continue to work towards goals in plan of care to maximize safety and independence in necessary daily tasks.                     OT Education - 12/06/18 0847    Education Details  exercises for tendon gliding, blocking for lumbricals    Person(s) Educated  Patient    Methods  Explanation;Demonstration;Verbal cues    Comprehension  Verbalized understanding;Returned demonstration          OT Long Term Goals - 11/18/18 1134      OT LONG TERM GOAL #1   Title  Pt will complete self feeding using adaptive utensils and compenstory techniques using R hand.    Baseline  Pt needs help with holding cup, cutting meat and uses L hand as assist.    Time  12    Period  Weeks    Status  On-going      OT LONG TERM GOAL #2   Title  Pt will complete LB dressing using adaptive equipment with min assist and cues in unsupported sitting.    Baseline   Now able to do her socks, pants but needs help with AFOs and shoes 11/18/18    Time  12    Period  Weeks    Status  On-going      OT LONG TERM GOAL #3   Title  Pt will complete toileting and hygiene with adaptive aid with min assist and cues on elevated toilet with use of grab bar and FWW for balance.    Baseline  Pt unable to complete hygiene after BM; able to complete Independently after urinating.    Time  12    Period  Weeks    Status  On-going      OT LONG TERM GOAL #4   Title  Pt will be educated in HEP for strengtening and fine motor coordination.    Baseline  Pt does not have any HEP for BUEs or hands.    Time  12    Period  Weeks    Status  On-going      OT LONG TERM GOAL #5   Title  Pt will increase strength  in B hands for use during ADLs by 4#.    Time  12    Period  Weeks    Status  Achieved      OT LONG TERM GOAL #6   Title  Pt will increase strength 2# in B pinch for lateral and 3 point for use during ADLs.    Baseline  R 4# L 3#    Time  12    Period  Weeks    Status  Achieved            Plan - 12/06/18 0848    Clinical Impression Statement  Patient normally very cheerful at the beginning of session however she reports she had a difficult and stressful morning today including her leg braces being broken and is worried about getting them repaired and being able to fully participate in PT today.  By the end of the session, patient was more centered, relaxed and positive about the rest of her day, she reports working in therapy helped today to distract her and now she feels much better.  Patient engaging in ROM and strengthening exercises for bilateral UEs but with increased focus on right hand for tendon gliding exercises.  Patient was issued [place] code for exercises as well as a handout.  Continue to work towards goals in plan of care to maximize safety and independence in necessary daily tasks.    OT Occupational Profile and History  Detailed Assessment-  Review of Records and additional review of physical, cognitive, psychosocial history related to current functional performance    Occupational performance deficits (Please refer to evaluation for details):  ADL's;IADL's;Education;Leisure    Body Structure / Function / Physical Skills  Strength;Gait;FMC;IADL;Balance;ADL;Coordination;Endurance;UE functional use;Mobility    Rehab Potential  Good    Clinical Decision Making  Several treatment options, min-mod task modification necessary    Comorbidities Affecting Occupational Performance:  Presence of comorbidities impacting occupational performance    Comorbidities impacting occupational performance description:  progressive muscle weakness, balance, coordination    Modification or Assistance to Complete Evaluation   Min-Moderate modification of tasks or assist with assess necessary to complete eval    OT Frequency  2x / week    OT Duration  12 weeks    OT Treatment/Interventions  Moist Heat;Self-care/ADL training;Therapeutic exercise;Patient/family education;Energy conservation;Therapist, nutritional;Therapeutic activities;Balance training;DME and/or AE instruction;Manual Therapy;Psychosocial skills training    Consulted and Agree with Plan of Care  Patient       Patient will benefit from skilled therapeutic intervention in order to improve the following deficits and impairments:   Body Structure / Function / Physical Skills: Strength, Gait, FMC, IADL, Balance, ADL, Coordination, Endurance, UE functional use, Mobility       Visit Diagnosis: Muscle weakness (generalized)  Other lack of coordination    Problem List Patient Active Problem List   Diagnosis Date Noted  .  Vitamin D deficiency 10/29/2018  . Migraine without status migrainosus, not intractable   . Chronic pain syndrome   . Lumbar disc disease with radiculopathy 07/21/2017  . Lumbar foraminal stenosis 07/21/2017  . Unable to walk 07/21/2017  . Multiple falls  07/21/2017  . Displaced fracture of proximal end of right fibula 07/21/2017  . Lower extremity weakness 05/28/2017  . Right lower quadrant abdominal pain 08/22/2016  . Abnormal TSH 01/10/2016  . Morbid obesity ([place]) 01/12/2014  . Routine general medical examination at a health care facility 10/28/2012  . GERD (gastroesophageal reflux disease) 10/28/2012  . Anxiety 12/20/2011  . Allergic rhinitis 12/20/2011  . Asthma 12/20/2011  . Lapband APL with HH repair 03/31/2011  . Uncontrolled type 2 diabetes mellitus with hyperglycemia, with long-term current use of insulin ([place]) 06/30/2007  . Hyperlipidemia 06/30/2007  . Essential hypertension 06/27/2007   Achilles Dunk, OTR/L, CLT  [name] 12/06/2018, 9:51 AM  [place] MAIN [facility] SERVICES [address] [place], [place], [address] Phone: [phone]   Fax:  [phone]  Name: SATINE BELLINA MRN: [id number] Date of Birth: [birth year]

## 2018-12-09 ENCOUNTER — Other Ambulatory Visit: Payer: Self-pay

## 2018-12-09 ENCOUNTER — Ambulatory Visit: Payer: BC Managed Care – PPO

## 2018-12-09 ENCOUNTER — Encounter: Payer: Self-pay | Admitting: Occupational Therapy

## 2018-12-09 ENCOUNTER — Ambulatory Visit: Payer: BC Managed Care – PPO | Admitting: Occupational Therapy

## 2018-12-09 DIAGNOSIS — R262 Difficulty in walking, not elsewhere classified: Secondary | ICD-10-CM

## 2018-12-09 DIAGNOSIS — R29898 Other symptoms and signs involving the musculoskeletal system: Secondary | ICD-10-CM

## 2018-12-09 DIAGNOSIS — M6281 Muscle weakness (generalized): Secondary | ICD-10-CM

## 2018-12-09 DIAGNOSIS — M25571 Pain in right ankle and joints of right foot: Secondary | ICD-10-CM

## 2018-12-09 DIAGNOSIS — R278 Other lack of coordination: Secondary | ICD-10-CM

## 2018-12-09 DIAGNOSIS — M25551 Pain in right hip: Secondary | ICD-10-CM

## 2018-12-09 NOTE — Therapy (Signed)
[place] MAIN [facility] SERVICES [address] [place], [place], [address] Phone: [phone]   Fax:  [phone]  Occupational Therapy Treatment  Patient Details  Name: [name] MRN: [id number] Date of Birth: 05-Jul-1965 Referring Provider (OT): Maria Friendly, MD   Encounter Date: 12/09/2018  OT End of Session - 12/12/18 1156    Visit Number  14    Number of Visits  24    Date for OT Re-Evaluation  12/17/18    Authorization Type  BCBS progress report period starting 09-24-18    Authorization Time Period  09/24/18-12/17/18    OT Start Time  1100    OT Stop Time  1144    OT Time Calculation (min)  44 min    Activity Tolerance  Patient tolerated treatment well    Behavior During Therapy  [facility] for tasks assessed/performed       Past Medical History:  Diagnosis Date  . Allergy   . Amyotrophic lateral sclerosis (ALS) ([place]) 09/23/2018  . Anemia   . Anxiety   . Arthritis   . Asthma   . Colon polyp   . Complication of anesthesia   . Diabetes mellitus   . Edema   . Fatigue   . Hypertension   . IBS (irritable bowel syndrome)   . Lump in female breast   . Methicillin resistant Staphylococcus aureus in conditions classified elsewhere and of unspecified site   . Migraines   . Morbid obesity ([place])   . Night sweats   . Nonspecific abnormal results of thyroid function study   . Other B-complex deficiencies   . Paresthesias   . PONV (postoperative nausea and vomiting)   . Pure hypercholesterolemia   . Reflux   . Sacral fracture ([place])   . Sinus problem   . Symptomatic states associated with artificial menopause   . Thyroid disease   . Type II or unspecified type diabetes mellitus without mention of complication, not stated as uncontrolled   . Unspecified sleep apnea     Past Surgical History:  Procedure Laterality Date  . abcess removal  1997   MRSA  . ABDOMINAL HYSTERECTOMY  1997   complete in 1997, partial was in 1995  . carpal  tunnel release r  07/03/13   Dalldorf  . [place]  . CHOLECYSTECTOMY  03/1989  . COLONOSCOPY  03/21/2011   Normal.  [facility].  [name] KNEE SURGERY  2006  . LAPAROSCOPIC GASTRIC BANDING  05/2009  . TONSILLECTOMY AND ADENOIDECTOMY  1974  . TUBAL LIGATION    . ULNAR TUNNEL RELEASE Right 02/05/2018   Procedure: CUBITAL TUNNEL RELEASE/DECOMPRESSION;  Surgeon: Maria Nakayama, MD;  Location: [facility];  Service: Orthopedics;  Laterality: Right;    There were no vitals filed for this visit.  Subjective Assessment - 12/12/18 1151    Subjective   Patient reports she will be going on a vacation soon and is working on trying to sort her clothing and pack.    Patient is accompanied by:  Family member    Pertinent History  [name] was recently diagnosed with ALS at [place] [facility].  She has had a recent decrease in strength in L side of body, UE and LE with decreased coordination.  She feels like she is dropping cups more and needs to use both hands for hand to mouth and handwriting has decreased to almost illegible.    Patient Stated Goals  I want to  work on Administrator, [employer], being able to hold a cup again and regain strength and coordination in both hands.        Therapeutic Exercises:    Patient performing sustained grip on second setting 11# bilateral UEs for 1 set of 25 repetitions each side, cues for grip on hand gripper and for sustained grip to pick up and hold objects in gripper. Reaching tasks multi directions with right and left UEs from seated position.  Neuromuscular Reeducation: Patient performing manipulation of small Grooved pegboard, promotion of translatory skills of the hand, using the hand for storage, performed bilaterally, cues at times for prehension patterns. Patient Unable to perform small snap beads, appear they are too small and require increased finger strength to place pieces together.   Patient performing One handed technique to separate magnets with cues  for technique and each hand and then working to place them onto magnetic board placed in elevation  Response to tx:  Patient continues to have difficulty with manipulation of small objects which require increased finger strength and manipulation skills to complete.  Patient continues to make good progress, she reports going to [place] [facility] recently and they were impressed with her progress and implementation of adaptive equipment and techniques to facilitate greater independence in daily tasks.  Will plan to update goals and reassess progress next session prior to patient going on vacation.                      OT Education - 12/12/18 1154    Education Details  fine motor coordination, finger strengthening.    Person(s) Educated  Patient    Methods  Explanation;Demonstration;Verbal cues    Comprehension  Verbalized understanding;Returned demonstration          OT Long Term Goals - 11/18/18 1134      OT LONG TERM GOAL #1   Title  Pt will complete self feeding using adaptive utensils and compenstory techniques using R hand.    Baseline  Pt needs help with holding cup, cutting meat and uses L hand as assist.    Time  12    Period  Weeks    Status  On-going      OT LONG TERM GOAL #2   Title  Pt will complete LB dressing using adaptive equipment with min assist and cues in unsupported sitting.    Baseline  Now able to do her socks, pants but needs help with AFOs and shoes 11/18/18    Time  12    Period  Weeks    Status  On-going      OT LONG TERM GOAL #3   Title  Pt will complete toileting and hygiene with adaptive aid with min assist and cues on elevated toilet with use of grab bar and FWW for balance.    Baseline  Pt unable to complete hygiene after BM; able to complete Independently after urinating.    Time  12    Period  Weeks    Status  On-going      OT LONG TERM GOAL #4   Title  Pt will be educated in HEP for strengtening and fine motor coordination.     Baseline  Pt does not have any HEP for BUEs or hands.    Time  12    Period  Weeks    Status  On-going      OT LONG TERM GOAL #5   Title  Pt will increase strength  in  B hands for use during ADLs by 4#.    Time  12    Period  Weeks    Status  Achieved      OT LONG TERM GOAL #6   Title  Pt will increase strength 2# in B pinch for lateral and 3 point for use during ADLs.    Baseline  R 4# L 3#    Time  12    Period  Weeks    Status  Achieved            Plan - 12/12/18 1906    Clinical Impression Statement  Patient continues to have difficulty with manipulation of small objects which require increased finger strength and manipulation skills to complete.  Patient continues to make good progress, she reports going to [place] [facility] recently and they were impressed with her progress and implementation of adaptive equipment and techniques to facilitate greater independence in daily tasks.  Will plan to update goals and reassess progress next session prior to patient going on vacation.    OT Occupational Profile and History  Detailed Assessment- Review of Records and additional review of physical, cognitive, psychosocial history related to current functional performance    Occupational performance deficits (Please refer to evaluation for details):  ADL's;IADL's;Education;Leisure    Body Structure / Function / Physical Skills  Strength;Gait;FMC;IADL;Balance;ADL;Coordination;Endurance;UE functional use;Mobility    Rehab Potential  Good    Clinical Decision Making  Several treatment options, min-mod task modification necessary    Comorbidities Affecting Occupational Performance:  Presence of comorbidities impacting occupational performance    Comorbidities impacting occupational performance description:  progressive muscle weakness, balance, coordination    Modification or Assistance to Complete Evaluation   Min-Moderate modification of tasks or assist with assess necessary to complete eval    OT  Frequency  2x / week    OT Duration  12 weeks    OT Treatment/Interventions  Moist Heat;Self-care/ADL training;Therapeutic exercise;Patient/family education;Energy conservation;Therapist, nutritional;Therapeutic activities;Balance training;DME and/or AE instruction;Manual Therapy;Psychosocial skills training    Consulted and Agree with Plan of Care  Patient       Patient will benefit from skilled therapeutic intervention in order to improve the following deficits and impairments:   Body Structure / Function / Physical Skills: Strength, Gait, FMC, IADL, Balance, ADL, Coordination, Endurance, UE functional use, Mobility       Visit Diagnosis: Muscle weakness (generalized)  Other lack of coordination    Problem List Patient Active Problem List   Diagnosis Date Noted  . Vitamin D deficiency 10/29/2018  . Migraine without status migrainosus, not intractable   . Chronic pain syndrome   . Lumbar disc disease with radiculopathy 07/21/2017  . Lumbar foraminal stenosis 07/21/2017  . Unable to walk 07/21/2017  . Multiple falls 07/21/2017  . Displaced fracture of proximal end of right fibula 07/21/2017  . Lower extremity weakness 05/28/2017  . Right lower quadrant abdominal pain 08/22/2016  . Abnormal TSH 01/10/2016  . Morbid obesity ([place]) 01/12/2014  . Routine general medical examination at a health care facility 10/28/2012  . GERD (gastroesophageal reflux disease) 10/28/2012  . Anxiety 12/20/2011  . Allergic rhinitis 12/20/2011  . Asthma 12/20/2011  . Lapband APL with HH repair 03/31/2011  . Uncontrolled type 2 diabetes mellitus with hyperglycemia, with long-term current use of insulin ([place]) 06/30/2007  . Hyperlipidemia 06/30/2007  . Essential hypertension 06/27/2007   Amy T Lovett, OTR/L, CLT  Lovett,Amy 12/12/2018, 7:15 PM  [facility]IN REH[facility] SERVICES  [place], [place], [address] Phone: [phone]   Fax:   [phone]  Name: [name] MRN: [id number] Date of Birth: December 14, 1965

## 2018-12-09 NOTE — Therapy (Signed)
[place] MAIN [facility] SERVICES [address] [place], [place], [address] Phone: [phone]   Fax:  [phone]  Physical Therapy Treatment  Patient Details  Name: [name] MRN: [id number] Date of Birth: 08/29/1965 Referring Provider (PT): Cornell Barman   Encounter Date: 12/09/2018  PT End of Session - 12/09/18 1154    Visit Number  29    Number of Visits  41    Date for PT Re-Evaluation  12/31/18    PT Start Time  5498    PT Stop Time  1228    PT Time Calculation (min)  40 min    Equipment Utilized During Treatment  Gait belt;Other (comment)   AFO bilat   Activity Tolerance  Patient tolerated treatment well;Patient limited by fatigue    Behavior During Therapy  [facility] [place] for tasks assessed/performed       Past Medical History:  Diagnosis Date  . Allergy   . Amyotrophic lateral sclerosis (ALS) ([place]) 09/23/2018  . Anemia   . Anxiety   . Arthritis   . Asthma   . Colon polyp   . Complication of anesthesia   . Diabetes mellitus   . Edema   . Fatigue   . Hypertension   . IBS (irritable bowel syndrome)   . Lump in female breast   . Methicillin resistant Staphylococcus aureus in conditions classified elsewhere and of unspecified site   . Migraines   . Morbid obesity ([place])   . Night sweats   . Nonspecific abnormal results of thyroid function study   . Other B-complex deficiencies   . Paresthesias   . PONV (postoperative nausea and vomiting)   . Pure hypercholesterolemia   . Reflux   . Sacral fracture ([place])   . Sinus problem   . Symptomatic states associated with artificial menopause   . Thyroid disease   . Type II or unspecified type diabetes mellitus without mention of complication, not stated as uncontrolled   . Unspecified sleep apnea     Past Surgical History:  Procedure Laterality Date  . abcess removal  1997   MRSA  . ABDOMINAL HYSTERECTOMY  1997   complete in 1997, partial was in 1995  . carpal tunnel release r   07/03/13   Dalldorf  . [place]  . CHOLECYSTECTOMY  03/1989  . COLONOSCOPY  03/21/2011   Normal.  [facility].  [name] KNEE SURGERY  2006  . LAPAROSCOPIC GASTRIC BANDING  05/2009  . TONSILLECTOMY AND ADENOIDECTOMY  1974  . TUBAL LIGATION    . ULNAR TUNNEL RELEASE Right 02/05/2018   Procedure: CUBITAL TUNNEL RELEASE/DECOMPRESSION;  Surgeon: Melrose Nakayama, MD;  Location: [place];  Service: Orthopedics;  Laterality: Right;    There were no vitals filed for this visit.  Subjective Assessment - 12/09/18 1153    Subjective  Pt doing well in general, enjoying the cold weather burst. Just finished OT. No pain this date.    Pertinent History   Patient fx her sacrum 9/18 and  she lost sensation to right foot, and she lost ability to DF her right foot. Patient had back surgery 03/2017 , she was sent home. She was walking with no device indoors and she could get out of the house and do the steps. June 18, 2017  her LLE was getting weaker and the right leg was getting stronger. Patient had follow up with neurosurgery. She was scheduled for a steriod shot Jul 23, 2017, but on  May 3 her legs didnt  feel right, she fell 4 times, she fractured her R fibula  on may 3rd, she went to hospital and spent one week. She went to SNF for 1 month. She had another neuro consult, resulting in kDx of  polyneuropathy. Then she had an apt with and [facility] said it was not polyneuropathy, (in [place])  She has a second consultation scheduled Dec 30 for a  Neuromuscular.consult.  She had a scan of the spine T 12-28-17  who then sent her to a thoracic surgeon at the end of November and was recommended to not due surgery. She does not know what her Dx is and she has been having home PT beginning August 25, 2017 . She has had HHPT 2 x  week for the last 5 months. She is able to walk 50 feet with RW. She is able to transfer from wc to stand independently.     Currently in Pain?  No/denies          INTERVENTION THIS DATE:   NUSTEP 6 MIN seat 10. Arms, level 5, 6 minutes, cardiovascular challenge supervised for safety, minGuard assist to supervision for SPT x2 WC to/from NuSTEP 52f AMB c RW Nustep to high/low table  Hooklying Bridge: 2x13 Hooklying Marching 2x10 bilat (0lb and 4lb)  SAQ with 4 lbs x 20 with 3 sec hold (*trial increase to 5lb AW next session)   Prone knee flex LLE x 10 x 2 (1.5lb weight) , RLE attempted x 10 with very minimal movement Prone knee flex and hip extension LLE x 10 x 2, RLE AAROM x 10 x 2 sidelying hip abd 2x20 , ext x 15 BLE      PT Short Term Goals - 10/23/18 1159      PT SHORT TERM GOAL #1   Title  Patient will be independent in home exercise program to improve strength/mobility for better functional independence with ADLs.    Time  6    Period  Weeks    Status  Partially Met    Target Date  09/10/18        PT Long Term Goals - 11/04/18 1513      PT LONG TERM GOAL #1   Title  Patient will increase 10 meter walk test to >.50 m/s as to improve gait speed for better community ambulation and to reduce fall risk.    Baseline  . 36 m/sec,, 10/23/18 =.43 m/ sec    Time  8    Period  Weeks    Status  Partially Met    Target Date  12/31/18      PT LONG TERM GOAL #2   Title  Patient will transfer sit to stand  from 19 inch mat without assist or anyone supporting AD  to demonstrate improved LE strength to decrease falls risk.     Time  12    Period  Weeks    Status  Achieved      PT LONG TERM GOAL #3   Title  Patient will demonstrate stance without AD  x 1 min to demonstrate decreased falls risk.     Baseline  10/23/18 =    Time  12    Period  Weeks    Status  Achieved    Target Date  12/31/18      PT LONG TERM GOAL #4   Title  Patient will  ambulate 500 feet to work towards community ambulation distances.  Baseline  270 feet, 10/23/18= 305 feet    Time  12    Period  Weeks    Status  Partially Met    Target Date  12/31/18      PT LONG TERM GOAL #5   Title   Patient (< 61 years old) will complete five times sit to stand test in < 10 seconds indicating an increased LE strength and improved balance.    Baseline  20.28 m/sec. 10/23/18 =16.82 ( from green chair)    Time  12    Period  Weeks    Status  Partially Met    Target Date  12/31/18      Additional Long Term Goals   Additional Long Term Goals  Yes      PT LONG TERM GOAL #6   Title  Patient will report a worst pain of 3/10 on VAS in left knee             to improve tolerance with ADLs and reduced symptoms with activities.    Time  12    Period  Weeks    Status  New    Target Date  12/31/18            Plan - 12/09/18 1155    Clinical Impression Statement  In general, patient demonstrating good tolerance to therapy session this date, reasonable accommodations are alllowed in-session to allow adequate rest between activities as needed, which patient verbalizes as needed without cue. All interventions executed without any exacerbation of pain. Pt demonstrates focused motivation to fully participate in therapy to the best of ability. Pt is able to progress some strengthening interventions, although strength deficits remain apparent to author, as well as easy fatiguability. Bolster and pillows used to assist with neutral lumbopelvic posture as needed during periods of fatigue and loss of postural control. Pt given intermittent multimodal cues to teach best possible form with exercises. Pt continues to make steady progress toward most goals. No home exercise updates made at this time.    Rehab Potential  Good    PT Frequency  2x / week    PT Duration  12 weeks    PT Treatment/Interventions  Gait training;Therapeutic activities;Therapeutic exercise;Balance training;Neuromuscular re-education;Patient/family education;Orthotic Fit/Training;Manual techniques    PT Next Visit Plan  Continue with current program.    PT Home Exercise Plan  No updates this date     Consulted and Agree with Plan of Care   Patient       Patient will benefit from skilled therapeutic intervention in order to improve the following deficits and impairments:  Decreased balance, Decreased endurance, Decreased mobility, Difficulty walking, Impaired sensation, Decreased range of motion, Decreased activity tolerance, Decreased coordination, Decreased strength, Pain, Postural dysfunction  Visit Diagnosis: Other lack of coordination  Muscle weakness (generalized)  Pain in right ankle and joints of right foot  Difficulty in walking, not elsewhere classified  Weakness of right leg  Pain in right hip     Problem List Patient Active Problem List   Diagnosis Date Noted  . Vitamin D deficiency 10/29/2018  . Migraine without status migrainosus, not intractable   . Chronic pain syndrome   . Lumbar disc disease with radiculopathy 07/21/2017  . Lumbar foraminal stenosis 07/21/2017  . Unable to walk 07/21/2017  . Multiple falls 07/21/2017  . Displaced fracture of proximal end of right fibula 07/21/2017  . Lower extremity weakness 05/28/2017  . Right lower quadrant abdominal pain 08/22/2016  . Abnormal TSH  01/10/2016  . Morbid obesity ([place]) 01/12/2014  . Routine general medical examination at a health care facility 10/28/2012  . GERD (gastroesophageal reflux disease) 10/28/2012  . Anxiety 12/20/2011  . Allergic rhinitis 12/20/2011  . Asthma 12/20/2011  . Lapband APL with HH repair 03/31/2011  . Uncontrolled type 2 diabetes mellitus with hyperglycemia, with long-term current use of insulin ([place]) 06/30/2007  . Hyperlipidemia 06/30/2007  . Essential hypertension 06/27/2007   12:11 PM, 12/09/18 Etta Grandchild, PT, DPT Physical Therapist - [facility]  Outpatient Physical Therapy- [place] [phone]     Etta Grandchild 12/09/2018, 11:57 AM  [place] MAIN [facility] SERVICES [address] [place], [place], [address] Phone:  [phone]   Fax:  [phone]  Name: RYIAN LYNDE MRN: [id number] Date of Birth: Aug 28, 1965

## 2018-12-11 ENCOUNTER — Ambulatory Visit: Payer: BC Managed Care – PPO

## 2018-12-11 ENCOUNTER — Other Ambulatory Visit: Payer: Self-pay

## 2018-12-11 ENCOUNTER — Ambulatory Visit: Payer: BC Managed Care – PPO | Admitting: Occupational Therapy

## 2018-12-11 DIAGNOSIS — M6281 Muscle weakness (generalized): Secondary | ICD-10-CM

## 2018-12-11 DIAGNOSIS — M25551 Pain in right hip: Secondary | ICD-10-CM

## 2018-12-11 DIAGNOSIS — R278 Other lack of coordination: Secondary | ICD-10-CM

## 2018-12-11 DIAGNOSIS — R262 Difficulty in walking, not elsewhere classified: Secondary | ICD-10-CM

## 2018-12-11 DIAGNOSIS — M25571 Pain in right ankle and joints of right foot: Secondary | ICD-10-CM

## 2018-12-11 DIAGNOSIS — R29898 Other symptoms and signs involving the musculoskeletal system: Secondary | ICD-10-CM

## 2018-12-11 NOTE — Therapy (Signed)
[place] MAIN [facility] SERVICES [place], [place], [address] Phone: [phone]   Fax:  [phone]  Physical Therapy Progress Note   Dates of reporting period  10/28/18   to   12/11/18  Physical Therapy Treatment  Patient Details  Name: [name] MRN: [id number] Date of Birth: 07/05/1965 Referring Provider (PT): Cornell Barman   Encounter Date: 12/11/2018  PT End of Session - 12/11/18 1226    Visit Number  30    Number of Visits  41    Date for PT Re-Evaluation  12/31/18    PT Start Time  1152    PT Stop Time  1230    PT Time Calculation (min)  38 min    Activity Tolerance  Patient limited by fatigue;Treatment limited secondary to medical complications (Comment);Other (comment)   SOB   Behavior During Therapy  WFL for tasks assessed/performed       Past Medical History:  Diagnosis Date  . Allergy   . Amyotrophic lateral sclerosis (ALS) ([place]) 09/23/2018  . Anemia   . Anxiety   . Arthritis   . Asthma   . Colon polyp   . Complication of anesthesia   . Diabetes mellitus   . Edema   . Fatigue   . Hypertension   . IBS (irritable bowel syndrome)   . Lump in female breast   . Methicillin resistant Staphylococcus aureus in conditions classified elsewhere and of unspecified site   . Migraines   . Morbid obesity ([place])   . Night sweats   . Nonspecific abnormal results of thyroid function study   . Other B-complex deficiencies   . Paresthesias   . PONV (postoperative nausea and vomiting)   . Pure hypercholesterolemia   . Reflux   . Sacral fracture ([place])   . Sinus problem   . Symptomatic states associated with artificial menopause   . Thyroid disease   . Type II or unspecified type diabetes mellitus without mention of complication, not stated as uncontrolled   . Unspecified sleep apnea     Past Surgical History:  Procedure Laterality Date  . abcess removal  1997   MRSA  . ABDOMINAL HYSTERECTOMY  1997    complete in 1997, partial was in 1995  . carpal tunnel release r  07/03/13   Dalldorf  . [place]  . CHOLECYSTECTOMY  03/1989  . COLONOSCOPY  03/21/2011   Normal.  [facility].  [name] KNEE SURGERY  2006  . LAPAROSCOPIC GASTRIC BANDING  05/2009  . TONSILLECTOMY AND ADENOIDECTOMY  1974  . TUBAL LIGATION    . ULNAR TUNNEL RELEASE Right 02/05/2018   Procedure: CUBITAL TUNNEL RELEASE/DECOMPRESSION;  Surgeon: Melrose Nakayama, MD;  Location: [place];  Service: Orthopedics;  Laterality: Right;    There were no vitals filed for this visit.  Subjective Assessment - 12/11/18 1224    Subjective  Pt reports feelign good today. Reports being tired after PT 2 DA. No pain to report this date.    Pertinent History   Patient fx her sacrum 9/18 and  she lost sensation to right foot, and she lost ability to DF her right foot. Patient had back surgery 03/2017 , she was sent home. She was walking with no device indoors and she could get out of the house and do the steps. June 18, 2017  her LLE was getting weaker and the right leg was getting stronger. Patient had follow up with neurosurgery.  She was scheduled for a steriod shot Jul 23, 2017, but on May 3 her legs didnt  feel right, she fell 4 times, she fractured her R fibula  on may 3rd, she went to hospital and spent one week. She went to SNF for 1 month. She had another neuro consult, resulting in kDx of  polyneuropathy. Then she had an apt with and [facility] said it was not polyneuropathy, (in [place])  She has a second consultation scheduled Dec 30 for a  Neuromuscular.consult.  She had a scan of the spine T 12-28-17  who then sent her to a thoracic surgeon at the end of November and was recommended to not due surgery. She does not know what her Dx is and she has been having home PT beginning August 25, 2017 . She has had HHPT 2 x  week for the last 5 months. She is able to walk 50 feet with RW. She is able to transfer from wc to stand independently.      Currently in Pain?  No/denies        INTERVENTION THIS DATE:   -NuStep Level 5, Seat 10, Arms 9, 5 minutes; supervision required for set-up/safety for SPT transport chair to/from NuStep -10MWT: 24.34sec (0.51ms) no MCID achieved -6MWT: 2149fc RW ([id number]), pt reports having increased SOB this date and notes "today is a bad day," would like to retest at next visit.  -5xSTS: c RW, BUE ad lib, standard chair: 16.89sec, no MCID achieved.  Pt fatigued after attempting to 6MWT, SOB, unable to continue to session, as planned, but agreeable trial additional Nustep activity at lower effort level to continue to target aerobic conditioning.   5 minutes Nustep, Level 2, Seat 10, Arms 9; supervision required for set-up/safety for SPT transport chair to/from NuStep     PT Education - 12/11/18 1225    Education provided  Yes    Education Details  potential benefit of including more UE strength into program or longer distance [employer]  Patient    Methods  Explanation;Demonstration    Comprehension  Verbalized understanding;Returned demonstration       PT Short Term Goals - 10/23/18 1159      PT SHORT TERM GOAL #1   Title  Patient will be independent in home exercise program to improve strength/mobility for better functional independence with ADLs.    Time  6    Period  Weeks    Status  Partially Met    Target Date  09/10/18        PT Long Term Goals - 11/04/18 1513      PT LONG TERM GOAL #1   Title  Patient will increase 10 meter walk test to >.50 m/s as to improve gait speed for better community ambulation and to reduce fall risk.    Baseline  . 36 m/sec,, 10/23/18 =.43 m/ sec    Time  8    Period  Weeks    Status  Partially Met    Target Date  12/31/18      PT LONG TERM GOAL #2   Title  Patient will transfer sit to stand  from 19 inch mat without assist or anyone supporting AD  to demonstrate improved LE strength to decrease falls risk.     Time  12     Period  Weeks    Status  Achieved      PT LONG TERM GOAL #3   Title  Patient will demonstrate stance without AD  x 1 min to demonstrate decreased falls risk.     Baseline  10/23/18 =    Time  12    Period  Weeks    Status  Achieved    Target Date  12/31/18      PT LONG TERM GOAL #4   Title  Patient will  ambulate 500 feet to work towards community ambulation distances.     Baseline  270 feet, 10/23/18= 305 feet    Time  12    Period  Weeks    Status  Partially Met    Target Date  12/31/18      PT LONG TERM GOAL #5   Title  Patient (< 11 years old) will complete five times sit to stand test in < 10 seconds indicating an increased LE strength and improved balance.    Baseline  20.28 m/sec. 10/23/18 =16.82 ( from green chair)    Time  12    Period  Weeks    Status  Partially Met    Target Date  12/31/18      Additional Long Term Goals   Additional Long Term Goals  Yes      PT LONG TERM GOAL #6   Title  Patient will report a worst pain of 3/10 on VAS in left knee             to improve tolerance with ADLs and reduced symptoms with activities.    Time  12    Period  Weeks    Status  New    Target Date  12/31/18            Plan - 12/11/18 1227    Clinical Impression Statement  In general, patient demonstrating poor tolerance to therapy session this date, reasonable accommodations are alllowed in-session to allow adequate rest between activities as needed, however pt reports today to be a 'bas day' for her breathing and her 6MWT is concordant. No MCID with 5xSTS or with 10MWT, but recent progress maintained. Will coordinate retest of 6MWT next session, potentially outside if weather permits to prevent detraction from mask wearing affecting DOE.     Rehab Potential  Good    PT Frequency  2x / week    PT Duration  12 weeks    PT Treatment/Interventions  Gait training;Therapeutic activities;Therapeutic exercise;Balance training;Neuromuscular re-education;Patient/family  education;Orthotic Fit/Training;Manual techniques    PT Next Visit Plan  Continue with current program.    PT Home Exercise Plan  No updates this date     Consulted and Agree with Plan of Care  Patient    Family Member Consulted  Spouse        Patient will benefit from skilled therapeutic intervention in order to improve the following deficits and impairments:  Decreased balance, Decreased endurance, Decreased mobility, Difficulty walking, Impaired sensation, Decreased range of motion, Decreased activity tolerance, Decreased coordination, Decreased strength, Pain, Postural dysfunction  Visit Diagnosis: Other lack of coordination  Muscle weakness (generalized)  Pain in right ankle and joints of right foot  Difficulty in walking, not elsewhere classified  Weakness of right leg  Pain in right hip     Problem List Patient Active Problem List   Diagnosis Date Noted  . Vitamin D deficiency 10/29/2018  . Migraine without status migrainosus, not intractable   . Chronic pain syndrome   . Lumbar disc disease with radiculopathy 07/21/2017  . Lumbar foraminal stenosis 07/21/2017  . Unable to  walk 07/21/2017  . Multiple falls 07/21/2017  . Displaced fracture of proximal end of right fibula 07/21/2017  . Lower extremity weakness 05/28/2017  . Right lower quadrant abdominal pain 08/22/2016  . Abnormal TSH 01/10/2016  . Morbid obesity ([place]) 01/12/2014  . Routine general medical examination at a health care facility 10/28/2012  . GERD (gastroesophageal reflux disease) 10/28/2012  . Anxiety 12/20/2011  . Allergic rhinitis 12/20/2011  . Asthma 12/20/2011  . Lapband APL with HH repair 03/31/2011  . Uncontrolled type 2 diabetes mellitus with hyperglycemia, with long-term current use of insulin ([place]) 06/30/2007  . Hyperlipidemia 06/30/2007  . Essential hypertension 06/27/2007   1:02 PM, 12/11/18 Etta Grandchild, PT, DPT Physical Therapist - [place] [phone]     Etta Grandchild 12/11/2018, 12:45 PM  [place] MAIN [facility] SERVICES [address] [place], [place], [address] Phone: [phone]   Fax:  [phone]  Name: SHERESA CULLOP MRN: [id number] Date of Birth: 1965-09-27

## 2018-12-12 ENCOUNTER — Encounter: Payer: Self-pay | Admitting: Occupational Therapy

## 2018-12-12 NOTE — Therapy (Signed)
[place] MAIN [facility] SERVICES [address] [place], [place], [address] Phone: [phone]   Fax:  [phone]  Occupational Therapy Treatment/REcertification  Patient Details  Name: [name] MRN: [id number] Date of Birth: [birth year] Referring Provider (OT): Maria Friendly, MD   Encounter Date: 12/11/2018  OT End of Session - 12/12/18 2114    Visit Number  15    Number of Visits  24    Date for OT Re-Evaluation  03/05/19    Authorization Type  [employer] progress report period starting 11/18/18    OT Start Time  1055    OT Stop Time  1145    OT Time Calculation (min)  50 min    Activity Tolerance  Patient tolerated treatment well    Behavior During Therapy  [facility] for tasks assessed/performed       Past Medical History:  Diagnosis Date  . Allergy   . Amyotrophic lateral sclerosis (ALS) ([place]) 09/23/2018  . Anemia   . Anxiety   . Arthritis   . Asthma   . Colon polyp   . Complication of anesthesia   . Diabetes mellitus   . Edema   . Fatigue   . Hypertension   . IBS (irritable bowel syndrome)   . Lump in female breast   . Methicillin resistant Staphylococcus aureus in conditions classified elsewhere and of unspecified site   . Migraines   . Morbid obesity ([place])   . Night sweats   . Nonspecific abnormal results of thyroid function study   . Other B-complex deficiencies   . Paresthesias   . PONV (postoperative nausea and vomiting)   . Pure hypercholesterolemia   . Reflux   . Sacral fracture ([place])   . Sinus problem   . Symptomatic states associated with artificial menopause   . Thyroid disease   . Type II or unspecified type diabetes mellitus without mention of complication, not stated as uncontrolled   . Unspecified sleep apnea     Past Surgical History:  Procedure Laterality Date  . abcess removal  1997   MRSA  . ABDOMINAL HYSTERECTOMY  1997   complete in 1997, partial was in 1995  . carpal tunnel release r  07/03/13    Dalldorf  . [place]  . CHOLECYSTECTOMY  03/1989  . COLONOSCOPY  03/21/2011   Normal.  [facility].  [name] KNEE SURGERY  2006  . LAPAROSCOPIC GASTRIC BANDING  05/2009  . TONSILLECTOMY AND ADENOIDECTOMY  1974  . TUBAL LIGATION    . ULNAR TUNNEL RELEASE Right 02/05/2018   Procedure: CUBITAL TUNNEL RELEASE/DECOMPRESSION;  Surgeon: Maria Nakayama, MD;  Location: [facility];  Service: Orthopedics;  Laterality: Right;    There were no vitals filed for this visit.  Subjective Assessment - 12/12/18 2112    Subjective   Patient reports she is doing well, has been able to do more around the house from a seated position and would like to be able to get back in the kitchen to help with meal preparation.    Pertinent History  [name] was recently diagnosed with ALS at [facility].  She has had a recent decrease in strength in L side of body, UE and LE with decreased coordination.  She feels like she is dropping cups more and needs to use both hands for hand to mouth and handwriting has decreased to almost illegible.    Patient Stated Goals  I want to work on handwriting,  being able to hold a cup again and regain strength and coordination in both hands.    Currently in Pain?  No/denies    Pain Score  0-No pain        Reassessment of skills, please refer to flow sheet for measurements for grip, pinch and coordination.    ADLs: Patient independent with UB bathing and dressing Assist with LB bathing, she is unable to don AFOs and shoes, assist with toilet hygiene but has a bottom buddy to use and improving with this.  She is able to don pants and underwear with setup.  She is able to perform grooming and oral care independently.  She has difficulty with buttons, snaps, zippers and has difficulty with donning and doffing earrings.  She drops her medication often and reports family finds pills on the floor at times.  She can dust from a seated position and fold laundry.  She has not  returned to any meal preparation activities but would like to.    Patient instructed on management of medication, recommended patient sort pills into organizer but use a cookie sheet as a lap tray so that if the pills drop and when taking pills (either use cup or tray underneath), they will be contained since the tray has a lip around.  We discussed the importance of this, if she drops pills she may risk not realizing and getting the proper medication she requires for the day.   [facility] OT Assessment - 12/13/18 0758      Coordination   9 Hole Peg Test  Right;Left    Right 9 Hole Peg Test  23    Left 9 Hole Peg Test  24      Hand Function   Right Hand Grip (lbs)  22    Right Hand Lateral Pinch  5 lbs    Right Hand 3 Point Pinch  4 lbs    Left Hand Grip (lbs)  26    Left Hand Lateral Pinch  5 lbs    Left 3 point pinch  3 lbs    Comment  2 point right 4#, Left 3#             Response to tx:  Patient has made significant improvements in strength of grip, doubled grip on right side.  She has implemented a variety of adaptive equipment to assist with self care and daily tasks and is requiring decreased assist with basic self care tasks.  She is participating in more IADL tasks from a seated position. She still has difficulty with toilet hygiene, donning AFOs and shoes.  She would like to be able to perform buttons, don and doff earrings and manage her medications without dropping them.  She would like to get back into the kitchen to assist with meal preparation to help her husband when she can.  Her laundry is outside but if her husband brings the basket she is able to fold some of the clothes.  She continues to benefit from skilled OT services to improve strength, ROM, coordination and increase her ability to participate in daily activities with greater independence.          OT Education - 12/12/18 2113    Education Details  plan of care, updated goals, HEP    Person(s) Educated   Patient    Methods  Explanation;Demonstration;Verbal cues    Comprehension  Verbalized understanding;Returned demonstration          OT Long Term Goals - 12/12/18  2117      OT LONG TERM GOAL #1   Title  Pt will complete self feeding using adaptive utensils and compenstory techniques using R hand.    Baseline  Pt needs help with holding cup, cutting meat and uses L hand as assist.    Time  12    Period  Weeks    Status  Achieved      OT LONG TERM GOAL #2   Title  Pt will complete LB dressing using adaptive equipment with min assist and cues in unsupported sitting.    Baseline  Now able to do her socks, pants but needs help with AFOs and shoes 12/11/18    Time  12    Period  Weeks    Status  On-going    Target Date  03/05/19      OT LONG TERM GOAL #3   Title  Pt will complete toileting and hygiene with adaptive aid with min assist and cues on elevated toilet with use of grab bar and FWW for balance.    Baseline  improving but still requires occasional assist    Time  12    Period  Weeks    Status  On-going    Target Date  03/05/19      OT LONG TERM GOAL #4   Title  Pt will be educated in HEP for strengthening and fine motor coordination.    Baseline  ongoing additions to home program    Time  12    Period  Weeks    Status  On-going    Target Date  03/05/19      OT LONG TERM GOAL #5   Title  Pt will increase strength  in B hands for use during ADLs by 3#    Baseline  exceeded initial goal, revised to 3 additional #    Time  12    Period  Weeks    Status  On-going    Target Date  03/05/19      Long Term Additional Goals   Additional Long Term Goals  Yes      OT LONG TERM GOAL #6   Title  Pt will increase strength 2# in B pinch for lateral and 3 point for use during ADLs.    Baseline  minimal changes in pinch 12/11/2018    Time  12    Period  Weeks    Status  Achieved      OT LONG TERM GOAL #7   Title  Patient will demonstrate improved fine motor coordination  skills to minimize dropping items such as pills    Baseline  drops items frequently    Time  12    Period  Weeks    Status  New    Target Date  03/05/19      OT LONG TERM GOAL #8   Title  Patient to demonstrate ability to manipulate buttons with or without use of adaptive equipment.    Baseline  unable to perform buttons and requires assist.    Time  12    Period  Weeks    Status  New    Target Date  03/05/19            Plan - 12/12/18 2117    Clinical Impression Statement  Patient has made significant improvements in strength of grip, doubled grip on right side.  She has implemented a variety of adaptive equipment to assist with self care and daily  tasks and is requiring decreased assist with basic self care tasks.  She is participating in more IADL tasks from a seated position. She still has difficulty with toilet hygiene, donning AFOs and shoes.  She would like to be able to perform buttons, don and doff earrings and manage her medications without dropping them.  She would like to get back into the kitchen to assist with meal preparation to help her husband when she can.  Her laundry is outside but if her husband brings the basket she is able to fold some of the clothes.  She continues to benefit from skilled OT services to improve strength, ROM, coordination and increase her ability to participate in daily activities with greater independence.    OT Occupational Profile and History  Detailed Assessment- Review of Records and additional review of physical, cognitive, psychosocial history related to current functional performance    Occupational performance deficits (Please refer to evaluation for details):  ADL's;IADL's;Education;Leisure    Body Structure / Function / Physical Skills  Strength;Gait;FMC;IADL;Balance;ADL;Coordination;Endurance;UE functional use;Mobility    Rehab Potential  Good    Clinical Decision Making  Several treatment options, min-mod task modification necessary     Comorbidities Affecting Occupational Performance:  Presence of comorbidities impacting occupational performance    Comorbidities impacting occupational performance description:  progressive muscle weakness, balance, coordination    Modification or Assistance to Complete Evaluation   Min-Moderate modification of tasks or assist with assess necessary to complete eval    OT Frequency  2x / week    OT Duration  12 weeks    OT Treatment/Interventions  Moist Heat;Self-care/ADL training;Therapeutic exercise;Patient/family education;Energy conservation;Therapist, nutritional;Therapeutic activities;Balance training;DME and/or AE instruction;Manual Therapy;Psychosocial skills training    Consulted and Agree with Plan of Care  Patient       Patient will benefit from skilled therapeutic intervention in order to improve the following deficits and impairments:   Body Structure / Function / Physical Skills: Strength, Gait, FMC, IADL, Balance, ADL, Coordination, Endurance, UE functional use, Mobility       Visit Diagnosis: Muscle weakness (generalized)  Other lack of coordination    Problem List Patient Active Problem List   Diagnosis Date Noted  . Vitamin D deficiency 10/29/2018  . Migraine without status migrainosus, not intractable   . Chronic pain syndrome   . Lumbar disc disease with radiculopathy 07/21/2017  . Lumbar foraminal stenosis 07/21/2017  . Unable to walk 07/21/2017  . Multiple falls 07/21/2017  . Displaced fracture of proximal end of right fibula 07/21/2017  . Lower extremity weakness 05/28/2017  . Right lower quadrant abdominal pain 08/22/2016  . Abnormal TSH 01/10/2016  . Morbid obesity ([place]) 01/12/2014  . Routine general medical examination at a health care facility 10/28/2012  . GERD (gastroesophageal reflux disease) 10/28/2012  . Anxiety 12/20/2011  . Allergic rhinitis 12/20/2011  . Asthma 12/20/2011  . Lapband APL with HH repair 03/31/2011  . Uncontrolled  type 2 diabetes mellitus with hyperglycemia, with long-term current use of insulin ([place]) 06/30/2007  . Hyperlipidemia 06/30/2007  . Essential hypertension 06/27/2007   Achilles Dunk, OTR/L, CLT  Hadli Vandemark 12/13/2018, 3:44 PM  [place] MAIN [facility] SERVICES [address] [place], [place], [address] Phone: [phone]   Fax:  [phone]  Name: [name] MRN: [id number] Date of Birth: 07-12-1965

## 2018-12-13 ENCOUNTER — Other Ambulatory Visit: Payer: Self-pay | Admitting: Primary Care

## 2018-12-16 ENCOUNTER — Ambulatory Visit: Payer: BC Managed Care – PPO | Admitting: Physical Therapy

## 2018-12-16 ENCOUNTER — Encounter: Payer: BC Managed Care – PPO | Admitting: Occupational Therapy

## 2018-12-18 ENCOUNTER — Ambulatory Visit: Payer: BC Managed Care – PPO | Admitting: Physical Therapy

## 2018-12-18 ENCOUNTER — Encounter: Payer: BC Managed Care – PPO | Admitting: Occupational Therapy

## 2018-12-23 ENCOUNTER — Encounter: Payer: Self-pay | Admitting: Physical Therapy

## 2018-12-23 ENCOUNTER — Encounter: Payer: Self-pay | Admitting: Occupational Therapy

## 2018-12-23 ENCOUNTER — Ambulatory Visit: Payer: BC Managed Care – PPO | Attending: Primary Care | Admitting: Occupational Therapy

## 2018-12-23 ENCOUNTER — Ambulatory Visit: Payer: BC Managed Care – PPO | Admitting: Physical Therapy

## 2018-12-23 ENCOUNTER — Other Ambulatory Visit: Payer: Self-pay

## 2018-12-23 DIAGNOSIS — R262 Difficulty in walking, not elsewhere classified: Secondary | ICD-10-CM | POA: Insufficient documentation

## 2018-12-23 DIAGNOSIS — R278 Other lack of coordination: Secondary | ICD-10-CM | POA: Insufficient documentation

## 2018-12-23 DIAGNOSIS — M25571 Pain in right ankle and joints of right foot: Secondary | ICD-10-CM | POA: Insufficient documentation

## 2018-12-23 DIAGNOSIS — M25551 Pain in right hip: Secondary | ICD-10-CM | POA: Diagnosis present

## 2018-12-23 DIAGNOSIS — R29898 Other symptoms and signs involving the musculoskeletal system: Secondary | ICD-10-CM | POA: Diagnosis present

## 2018-12-23 DIAGNOSIS — M6281 Muscle weakness (generalized): Secondary | ICD-10-CM

## 2018-12-23 NOTE — Therapy (Signed)
[place] MAIN [facility] SERVICES [address] [place], [place], [address] Phone: [phone]   Fax:  [phone]  Occupational Therapy Treatment  Patient Details  Name: [name] MRN: [id number] Date of Birth: [birth year] Referring Provider (OT): Alma Friendly, MD   Encounter Date: 12/23/2018  OT End of Session - 12/23/18 1138    Visit Number  16    Number of Visits  24    Date for OT Re-Evaluation  03/05/19    Authorization Type  BCBS progress report period starting 11/18/18    Authorization Time Period  09/24/18-12/17/18    OT Start Time  1100    OT Stop Time  1145    OT Time Calculation (min)  45 min    Activity Tolerance  Patient tolerated treatment well    Behavior During Therapy  [facility] for tasks assessed/performed       Past Medical History:  Diagnosis Date  . Allergy   . Amyotrophic lateral sclerosis (ALS) ([place]) 09/23/2018  . Anemia   . Anxiety   . Arthritis   . Asthma   . Colon polyp   . Complication of anesthesia   . Diabetes mellitus   . Edema   . Fatigue   . Hypertension   . IBS (irritable bowel syndrome)   . Lump in female breast   . Methicillin resistant Staphylococcus aureus in conditions classified elsewhere and of unspecified site   . Migraines   . Morbid obesity ([place])   . Night sweats   . Nonspecific abnormal results of thyroid function study   . Other B-complex deficiencies   . Paresthesias   . PONV (postoperative nausea and vomiting)   . Pure hypercholesterolemia   . Reflux   . Sacral fracture ([place])   . Sinus problem   . Symptomatic states associated with artificial menopause   . Thyroid disease   . Type II or unspecified type diabetes mellitus without mention of complication, not stated as uncontrolled   . Unspecified sleep apnea     Past Surgical History:  Procedure Laterality Date  . abcess removal  1997   MRSA  . ABDOMINAL HYSTERECTOMY  1997   complete in 1997, partial was in 1995  . carpal  tunnel release r  07/03/13   Dalldorf  . [place]  . CHOLECYSTECTOMY  03/1989  . COLONOSCOPY  03/21/2011   Normal.  [facility].  [name] KNEE SURGERY  2006  . LAPAROSCOPIC GASTRIC BANDING  05/2009  . TONSILLECTOMY AND ADENOIDECTOMY  1974  . TUBAL LIGATION    . ULNAR TUNNEL RELEASE Right 02/05/2018   Procedure: CUBITAL TUNNEL RELEASE/DECOMPRESSION;  Surgeon: Mel[place] Nakayama, MD;  Location: [place];  Service: Orthopedics;  Laterality: Right;    There were no vitals filed for this visit.  Subjective Assessment - 12/23/18 1101    Subjective   Pt. reports that she is doing well, an dis back from her trip to [place].   Patient is accompanied by:  Family member    Pertinent History  [name] was recently diagnosed with ALS at [facility].  She has had a recent decrease in strength in L side of body, UE and LE with decreased coordination.  She feels like she is dropping cups more and needs to use both hands for hand to mouth and handwriting has decreased to almost illegible.    Currently in Pain?  No/denies      OT TREATMENT  Neuro muscular re-education:  Pt. worked on grasping one inch resistive cubes alternating thumb opposition to the tip of the 2nd, and 3rd digits. The board was positioned at a vertical angle. Pt. worked on pressing them back into place while isolating 2nd, and 3rd digits. Pt. worked on bilateral [facility] skills needed to grasp small resistive beads. Pt. worked on connecting the beads using a 3pt. pinch, and lateral pinch grasp. Pt. had difficulty formulating her hand position steady when grasping, and dropping multiple beads. The task was modified, with pt. then working on grasping single wide resistive clips, moving them in her hand, and positioning them to the very tip of her 2nd digit, and thumb, and pressing them with a tip to tip pinch grasp.    Self-care:   Pt. was able to perform buttoning skills independently with the use of a buttonhook using  both the right, and left hand. Pt. worked on buttoning a shirt simulated in an upright position for buttoning a shirt for herself, as well as simulated position for buttoning a shirt on a toddler.    Response to Treatment  Pt. has difficulty with bilateral hand coordination, resistive grasping, and pinch with the tips of her digits with pressing small objects together. Pt. continues to present with limited UE strength, andFMC skills. Pt. continues to work on these skills in order to improve overall ADLs, and IADL tasks.                    OT Education - 12/23/18 1110    Education Details  plan of care, updated goals, HEP    Person(s) Educated  Patient    Methods  Explanation;Demonstration;Verbal cues    Comprehension  Verbalized understanding;Returned demonstration          OT Long Term Goals - 12/12/18 2117      OT LONG TERM GOAL #1   Title  Pt will complete self feeding using adaptive utensils and compenstory techniques using R hand.    Baseline  Pt needs help with holding cup, cutting meat and uses L hand as assist.    Time  12    Period  Weeks    Status  Achieved      OT LONG TERM GOAL #2   Title  Pt will complete LB dressing using adaptive equipment with min assist and cues in unsupported sitting.    Baseline  Now able to do her socks, pants but needs help with AFOs and shoes 12/11/18    Time  12    Period  Weeks    Status  On-going    Target Date  03/05/19      OT LONG TERM GOAL #3   Title  Pt will complete toileting and hygiene with adaptive aid with min assist and cues on elevated toilet with use of grab bar and FWW for balance.    Baseline  improving but still requires occasional assist    Time  12    Period  Weeks    Status  On-going    Target Date  03/05/19      OT LONG TERM GOAL #4   Title  Pt will be educated in HEP for strengthening and fine motor coordination.    Baseline  ongoing additions to home program    Time  12    Period  Weeks     Status  On-going    Target Date  03/05/19      OT LONG TERM  GOAL #5   Title  Pt will increase strength  in B hands for use during ADLs by 3#    Baseline  exceeded initial goal, revised to 3 additional #    Time  12    Period  Weeks    Status  On-going    Target Date  03/05/19      Long Term Additional Goals   Additional Long Term Goals  Yes      OT LONG TERM GOAL #6   Title  Pt will increase strength 2# in B pinch for lateral and 3 point for use during ADLs.    Baseline  minimal changes in pinch 12/11/2018    Time  12    Period  Weeks    Status  Achieved      OT LONG TERM GOAL #7   Title  Patient will demonstrate improved fine motor coordination skills to minimize dropping items such as pills    Baseline  drops items frequently    Time  12    Period  Weeks    Status  New    Target Date  03/05/19      OT LONG TERM GOAL #8   Title  Patient to demonstrate ability to manipulate buttons with or without use of adaptive equipment.    Baseline  unable to perform buttons and requires assist.    Time  12    Period  Weeks    Status  New    Target Date  03/05/19            Plan - 12/23/18 1139    Clinical Impression Statement Pt. is making progress overall. Pt. was very excited about being able to shuck 12 ears of corn without difficulty this weekend. Pt. has difficulty with bilateral hand coordination, resistive grasping, and pinch with the tips of her digits with pressing small objects together. Pt. continues to present with limited UE strength, andFMC skills. Pt. continues to work on these skills in order to improve overall ADLs, and IADL tasks.   OT Occupational Profile and History  Detailed Assessment- Review of Records and additional review of physical, cognitive, psychosocial history related to current functional performance    Occupational performance deficits (Please refer to evaluation for details):  ADL's;IADL's;Education;Leisure    Body Structure / Function / Physical  Skills  Strength;Gait;FMC;IADL;Balance;ADL;Coordination;Endurance;UE functional use;Mobility    Rehab Potential  Good    Clinical Decision Making  Several treatment options, min-mod task modification necessary    Comorbidities Affecting Occupational Performance:  Presence of comorbidities impacting occupational performance    Comorbidities impacting occupational performance description:  progressive muscle weakness, balance, coordination    Modification or Assistance to Complete Evaluation   Min-Moderate modification of tasks or assist with assess necessary to complete eval    OT Frequency  2x / week    OT Duration  12 weeks    OT Treatment/Interventions  Moist Heat;Self-care/ADL training;Therapeutic exercise;Patient/family education;Energy conservation;Therapist, nutritional;Therapeutic activities;Balance training;DME and/or AE instruction;Manual Therapy;Psychosocial skills training    Consulted and Agree with Plan of Care  Patient       Patient will benefit from skilled therapeutic intervention in order to improve the following deficits and impairments:   Body Structure / Function / Physical Skills: Strength, Gait, FMC, IADL, Balance, ADL, Coordination, Endurance, UE functional use, Mobility       Visit Diagnosis: Muscle weakness (generalized)  Other lack of coordination    Problem List Patient Active Problem List  Diagnosis Date Noted  . Vitamin D deficiency 10/29/2018  . Migraine without status migrainosus, not intractable   . Chronic pain syndrome   . Lumbar disc disease with radiculopathy 07/21/2017  . Lumbar foraminal stenosis 07/21/2017  . Unable to walk 07/21/2017  . Multiple falls 07/21/2017  . Displaced fracture of proximal end of right fibula 07/21/2017  . Lower extremity weakness 05/28/2017  . Right lower quadrant abdominal pain 08/22/2016  . Abnormal TSH 01/10/2016  . Morbid obesity ([place]) 01/12/2014  . Routine general medical examination at a health care  facility 10/28/2012  . GERD (gastroesophageal reflux disease) 10/28/2012  . Anxiety 12/20/2011  . Allergic rhinitis 12/20/2011  . Asthma 12/20/2011  . Lapband APL with HH repair 03/31/2011  . Uncontrolled type 2 diabetes mellitus with hyperglycemia, with long-term current use of insulin ([place]) 06/30/2007  . Hyperlipidemia 06/30/2007  . Essential hypertension 06/27/2007    Harrel Carina, MS, OTR/L 12/23/2018, 11:57 AM  [place] MAIN [facility] SERVICES [address] [place], [place], [address] Phone: [phone]   Fax:  [phone]  Name: [name] MRN: [id number] Date of Birth: 09/04/1965

## 2018-12-23 NOTE — Therapy (Signed)
[place] MAIN [facility] SERVICES [address] [place], [place], [address] Phone: [phone]   Fax:  [phone]  Physical Therapy Treatment  Patient Details  Name: [name] MRN: [id number] Date of Birth: 1966-02-08 Referring Provider (PT): Cornell Barman   Encounter Date: 12/23/2018  PT End of Session - 12/23/18 1142    Visit Number  31    Number of Visits  41    Date for PT Re-Evaluation  12/31/18    PT Start Time  4332    PT Stop Time  1230    PT Time Calculation (min)  45 min    Activity Tolerance  Patient limited by fatigue;Treatment limited secondary to medical complications (Comment);Other (comment)   SOB   Behavior During Therapy  WFL for tasks assessed/performed       Past Medical History:  Diagnosis Date  . Allergy   . Amyotrophic lateral sclerosis (ALS) ([place]) 09/23/2018  . Anemia   . Anxiety   . Arthritis   . Asthma   . Colon polyp   . Complication of anesthesia   . Diabetes mellitus   . Edema   . Fatigue   . Hypertension   . IBS (irritable bowel syndrome)   . Lump in female breast   . Methicillin resistant Staphylococcus aureus in conditions classified elsewhere and of unspecified site   . Migraines   . Morbid obesity ([place])   . Night sweats   . Nonspecific abnormal results of thyroid function study   . Other B-complex deficiencies   . Paresthesias   . PONV (postoperative nausea and vomiting)   . Pure hypercholesterolemia   . Reflux   . Sacral fracture ([place])   . Sinus problem   . Symptomatic states associated with artificial menopause   . Thyroid disease   . Type II or unspecified type diabetes mellitus without mention of complication, not stated as uncontrolled   . Unspecified sleep apnea     Past Surgical History:  Procedure Laterality Date  . abcess removal  1997   MRSA  . ABDOMINAL HYSTERECTOMY  1997   complete in 1997, partial was in 1995  . carpal tunnel release r  07/03/13   [facility]  .  [place]  . CHOLECYSTECTOMY  03/1989  . COLONOSCOPY  03/21/2011   Normal.  [facility].  [name] KNEE SURGERY  2006  . LAPAROSCOPIC GASTRIC BANDING  05/2009  . TONSILLECTOMY AND ADENOIDECTOMY  1974  . TUBAL LIGATION    . ULNAR TUNNEL RELEASE Right 02/05/2018   Procedure: CUBITAL TUNNEL RELEASE/DECOMPRESSION;  Surgeon: Melrose Nakayama, MD;  Location: [place];  Service: Orthopedics;  Laterality: Right;    There were no vitals filed for this visit.  Subjective Assessment - 12/23/18 1143    Subjective  Pt reports feelign good today. Reports being tired after PT 2 DA. No pain to report this date.    Pertinent History   Patient fx her sacrum 9/18 and  she lost sensation to right foot, and she lost ability to DF her right foot. Patient had back surgery 03/2017 , she was sent home. She was walking with no device indoors and she could get out of the house and do the steps. June 18, 2017  her LLE was getting weaker and the right leg was getting stronger. Patient had follow up with neurosurgery. She was scheduled for a steriod shot Jul 23, 2017, but on May 3 her legs didnt  feel  right, she fell 4 times, she fractured her R fibula  on may 3rd, she went to hospital and spent one week. She went to SNF for 1 month. She had another neuro consult, resulting in kDx of  polyneuropathy. Then she had an apt with and [facility] said it was not polyneuropathy, (in [place])  She has a second consultation scheduled Dec 30 for a  Neuromuscular.consult.  She had a scan of the spine T 12-28-17  who then sent her to a thoracic surgeon at the end of November and was recommended to not due surgery. She does not know what her Dx is and she has been having home PT beginning August 25, 2017 . She has had HHPT 2 x  week for the last 5 months. She is able to walk 50 feet with RW. She is able to transfer from wc to stand independently.            Treatment: Nu-step x 10 mins L 4 , cues to bring left leg to neutral  PROM to B  ankle heel cords x 30 sec x 3, hamstring stretching x 30 sec x 3 BLE SAQ with 4 lbs x 5 sec BLE x 20 x 2  Hooklying Marching 4 lbs  x 20 x 2 , standing BLE Bridging x 15 with 5 sec hold x 20   Pt educated  about  posture and technique with exercises to improve exercise technique, movement at target joints, with min  verbal, visual, tactile cues.                         PT Education - 12/23/18 1142    Education provided  Yes    Education Details  HEP    Person(s) Educated  Patient    Methods  Explanation;Demonstration;Verbal cues    Comprehension  Verbalized understanding;Returned demonstration;Need further instruction       PT Short Term Goals - 10/23/18 1159      PT SHORT TERM GOAL #1   Title  Patient will be independent in home exercise program to improve strength/mobility for better functional independence with ADLs.    Time  6    Period  Weeks    Status  Partially Met    Target Date  09/10/18        PT Long Term Goals - 11/04/18 1513      PT LONG TERM GOAL #1   Title  Patient will increase 10 meter walk test to >.50 m/s as to improve gait speed for better community ambulation and to reduce fall risk.    Baseline  . 36 m/sec,, 10/23/18 =.43 m/ sec    Time  8    Period  Weeks    Status  Partially Met    Target Date  12/31/18      PT LONG TERM GOAL #2   Title  Patient will transfer sit to stand  from 19 inch mat without assist or anyone supporting AD  to demonstrate improved LE strength to decrease falls risk.     Time  12    Period  Weeks    Status  Achieved      PT LONG TERM GOAL #3   Title  Patient will demonstrate stance without AD  x 1 min to demonstrate decreased falls risk.     Baseline  10/23/18 =    Time  12    Period  Weeks    Status  Achieved    Target Date  12/31/18      PT LONG TERM GOAL #4   Title  Patient will  ambulate 500 feet to work towards community ambulation distances.     Baseline  270 feet, 10/23/18= 305 feet    Time  12     Period  Weeks    Status  Partially Met    Target Date  12/31/18      PT LONG TERM GOAL #5   Title  Patient (< 74 years old) will complete five times sit to stand test in < 10 seconds indicating an increased LE strength and improved balance.    Baseline  20.28 m/sec. 10/23/18 =16.82 ( from green chair)    Time  12    Period  Weeks    Status  Partially Met    Target Date  12/31/18      Additional Long Term Goals   Additional Long Term Goals  Yes      PT LONG TERM GOAL #6   Title  Patient will report a worst pain of 3/10 on VAS in left knee             to improve tolerance with ADLs and reduced symptoms with activities.    Time  12    Period  Weeks    Status  New    Target Date  12/31/18            Plan - 12/23/18 1312    Clinical Impression Statement  Patient   instructed in transfers with rest periods standing. Patient performs intermediate LE exercises in supine and sitting open and closed chain. Patient will continue to benefit from skilled PT to improve strength, balance and gait. Patient continues to have LE weakness and decreased static and dynamic standing balance.    Rehab Potential  Good    PT Frequency  2x / week    PT Duration  12 weeks    PT Treatment/Interventions  Gait training;Therapeutic activities;Therapeutic exercise;Balance training;Neuromuscular re-education;Patient/family education;Orthotic Fit/Training;Manual techniques    PT Next Visit Plan  Continue with current program.    PT Home Exercise Plan  No updates this date     Consulted and Agree with Plan of Care  Patient    Family Member Consulted  Spouse        Patient will benefit from skilled therapeutic intervention in order to improve the following deficits and impairments:  Decreased balance, Decreased endurance, Decreased mobility, Difficulty walking, Impaired sensation, Decreased range of motion, Decreased activity tolerance, Decreased coordination, Decreased strength, Pain, Postural  dysfunction  Visit Diagnosis: Muscle weakness (generalized)  Other lack of coordination  Pain in right ankle and joints of right foot  Difficulty in walking, not elsewhere classified  Weakness of right leg  Pain in right hip     Problem List Patient Active Problem List   Diagnosis Date Noted  . Vitamin D deficiency 10/29/2018  . Migraine without status migrainosus, not intractable   . Chronic pain syndrome   . Lumbar disc disease with radiculopathy 07/21/2017  . Lumbar foraminal stenosis 07/21/2017  . Unable to walk 07/21/2017  . Multiple falls 07/21/2017  . Displaced fracture of proximal end of right fibula 07/21/2017  . Lower extremity weakness 05/28/2017  . Right lower quadrant abdominal pain 08/22/2016  . Abnormal TSH 01/10/2016  . Morbid obesity ([place]) 01/12/2014  . Routine general medical examination at a health care facility 10/28/2012  . GERD (gastroesophageal reflux disease) 10/28/2012  .  Anxiety 12/20/2011  . Allergic rhinitis 12/20/2011  . Asthma 12/20/2011  . Lapband APL with HH repair 03/31/2011  . Uncontrolled type 2 diabetes mellitus with hyperglycemia, with long-term current use of insulin ([place]) 06/30/2007  . Hyperlipidemia 06/30/2007  . Essential hypertension 06/27/2007    Alanson Puls, PT DPT 12/23/2018, 1:14 PM  [place] MAIN [facility] SERVICES [address] [place], [place], [address] Phone: [phone]   Fax:  [phone]  Name: LANECIA SLIVA MRN: [id number] Date of Birth: 12-13-1965

## 2018-12-25 ENCOUNTER — Ambulatory Visit: Payer: BC Managed Care – PPO | Admitting: Physical Therapy

## 2018-12-25 ENCOUNTER — Encounter: Payer: Self-pay | Admitting: Occupational Therapy

## 2018-12-25 ENCOUNTER — Encounter: Payer: Self-pay | Admitting: Physical Therapy

## 2018-12-25 ENCOUNTER — Ambulatory Visit: Payer: BC Managed Care – PPO | Admitting: Occupational Therapy

## 2018-12-25 ENCOUNTER — Other Ambulatory Visit: Payer: Self-pay

## 2018-12-25 DIAGNOSIS — M6281 Muscle weakness (generalized): Secondary | ICD-10-CM

## 2018-12-25 DIAGNOSIS — M25571 Pain in right ankle and joints of right foot: Secondary | ICD-10-CM

## 2018-12-25 DIAGNOSIS — R29898 Other symptoms and signs involving the musculoskeletal system: Secondary | ICD-10-CM

## 2018-12-25 DIAGNOSIS — R278 Other lack of coordination: Secondary | ICD-10-CM

## 2018-12-25 DIAGNOSIS — R262 Difficulty in walking, not elsewhere classified: Secondary | ICD-10-CM

## 2018-12-25 NOTE — Therapy (Signed)
[place] MAIN [facility] SERVICES [address] [place], [place], [address] Phone: [phone]   Fax:  [phone]  Occupational Therapy Treatment  Patient Details  Name: [name] MRN: [id number] Date of Birth: 01-Jun-1965 Referring Provider (OT): Alma Friendly, MD   Encounter Date: 12/25/2018  OT End of Session - 12/25/18 1118    Visit Number  17    Number of Visits  24    Date for OT Re-Evaluation  03/05/19    Authorization Type  [employer] progress report period starting 11/18/18    Authorization Time Period  09/24/18-12/17/18    OT Start Time  1105    OT Stop Time  1145    OT Time Calculation (min)  40 min    Activity Tolerance  Patient tolerated treatment well    Behavior During Therapy  [facility] for tasks assessed/performed       Past Medical History:  Diagnosis Date  . Allergy   . Amyotrophic lateral sclerosis (ALS) ([place]) 09/23/2018  . Anemia   . Anxiety   . Arthritis   . Asthma   . Colon polyp   . Complication of anesthesia   . Diabetes mellitus   . Edema   . Fatigue   . Hypertension   . IBS (irritable bowel syndrome)   . Lump in female breast   . Methicillin resistant Staphylococcus aureus in conditions classified elsewhere and of unspecified site   . Migraines   . Morbid obesity ([place])   . Night sweats   . Nonspecific abnormal results of thyroid function study   . Other B-complex deficiencies   . Paresthesias   . PONV (postoperative nausea and vomiting)   . Pure hypercholesterolemia   . Reflux   . Sacral fracture ([place])   . Sinus problem   . Symptomatic states associated with artificial menopause   . Thyroid disease   . Type II or unspecified type diabetes mellitus without mention of complication, not stated as uncontrolled   . Unspecified sleep apnea     Past Surgical History:  Procedure Laterality Date  . abcess removal  1997   MRSA  . ABDOMINAL HYSTERECTOMY  1997   complete in 1997, partial was in 1995  . carpal  tunnel release r  07/03/13   [facility]orf  . [place]  . CHOLECYSTECTOMY  03/1989  . COLONOSCOPY  03/21/2011   Normal.  [facility].  [name] KNEE SURGERY  2006  . LAPAROSCOPIC GASTRIC BANDING  05/2009  . TONSILLECTOMY AND ADENOIDECTOMY  1974  . TUBAL LIGATION    . ULNAR TUNNEL RELEASE Right 02/05/2018   Procedure: CUBITAL TUNNEL RELEASE/DECOMPRESSION;  Surgeon: Melrose Nakayama, MD;  Location: [place];  Service: Orthopedics;  Laterality: Right;    There were no vitals filed for this visit.  Subjective Assessment - 12/25/18 1117    Subjective   Pt. reports that she is doing well.    Patient is accompanied by:  Family member    Currently in Pain?  No/denies      OT TREATMENT    Neuro muscular re-education:  Pt. worked on bilateral [facility] skills grasping small 1/2" small pegs. Pt. worked on grasping the objects, and turning them in her fingers to position them in preparation for placing them into the pegboard. Pt. Dropped several pegs with her left hand.  Response to Treatment  Pt. reports that she was able to button her toddler granddaughters clothing this morning using the buttonhook. Pt.  is making progress, and continues to work on improving [facility] skills for ADL,a nd IADL functioning. Pt. continues to work on improving BUE strength, and [facility] skills in order to improve overall ADL, and IADL functioning. Pt. also continues to benefit from OT services to review adaptive techniques to improve ADL independence.                       OT Education - 12/25/18 1118    Education Details  plan of care    Person(s) Educated  Patient    Methods  Explanation;Demonstration;Verbal cues    Comprehension  Verbalized understanding;Returned demonstration          OT Long Term Goals - 12/12/18 2117      OT LONG TERM GOAL #1   Title  Pt will complete self feeding using adaptive utensils and compenstory techniques using R hand.    Baseline  Pt needs help with holding cup,  cutting meat and uses L hand as assist.    Time  12    Period  Weeks    Status  Achieved      OT LONG TERM GOAL #2   Title  Pt will complete LB dressing using adaptive equipment with min assist and cues in unsupported sitting.    Baseline  Now able to do her socks, pants but needs help with AFOs and shoes 12/11/18    Time  12    Period  Weeks    Status  On-going    Target Date  03/05/19      OT LONG TERM GOAL #3   Title  Pt will complete toileting and hygiene with adaptive aid with min assist and cues on elevated toilet with use of grab bar and FWW for balance.    Baseline  improving but still requires occasional assist    Time  12    Period  Weeks    Status  On-going    Target Date  03/05/19      OT LONG TERM GOAL #4   Title  Pt will be educated in HEP for strengthening and fine motor coordination.    Baseline  ongoing additions to home program    Time  12    Period  Weeks    Status  On-going    Target Date  03/05/19      OT LONG TERM GOAL #5   Title  Pt will increase strength  in B hands for use during ADLs by 3#    Baseline  exceeded initial goal, revised to 3 additional #    Time  12    Period  Weeks    Status  On-going    Target Date  03/05/19      Long Term Additional Goals   Additional Long Term Goals  Yes      OT LONG TERM GOAL #6   Title  Pt will increase strength 2# in B pinch for lateral and 3 point for use during ADLs.    Baseline  minimal changes in pinch 12/11/2018    Time  12    Period  Weeks    Status  Achieved      OT LONG TERM GOAL #7   Title  Patient will demonstrate improved fine motor coordination skills to minimize dropping items such as pills    Baseline  drops items frequently    Time  12    Period  Weeks    Status  New    Target Date  03/05/19      OT LONG TERM GOAL #8   Title  Patient to demonstrate ability to manipulate buttons with or without use of adaptive equipment.    Baseline  unable to perform buttons and requires assist.     Time  12    Period  Weeks    Status  New    Target Date  03/05/19            Plan - 12/25/18 1118    Clinical Impression Statement  Pt. reports that she was able to button her toddler granddaughters clothing this morning using the buttonhook. Pt. is making progress, and continues to work on improving [facility] skills for ADL,a nd IADL functioning. Pt. continues to work on improving BUE strength, and [facility] skills in order to improve overall ADL, and IADL functioning. Pt. also continues to benefit from OT services to review adaptive techniques to improve ADL independence.    OT Occupational Profile and History  Detailed Assessment- Review of Records and additional review of physical, cognitive, psychosocial history related to current functional performance    Occupational performance deficits (Please refer to evaluation for details):  ADL's;IADL's;Education;Leisure    Body Structure / Function / Physical Skills  Strength;Gait;FMC;IADL;Balance;ADL;Coordination;Endurance;UE functional use;Mobility    Rehab Potential  Good    Clinical Decision Making  Several treatment options, min-mod task modification necessary    Comorbidities Affecting Occupational Performance:  Presence of comorbidities impacting occupational performance    Comorbidities impacting occupational performance description:  progressive muscle weakness, balance, coordination    OT Frequency  2x / week    OT Duration  12 weeks    OT Treatment/Interventions  Moist Heat;Self-care/ADL training;Therapeutic exercise;Patient/family education;Energy conservation;Therapist, nutritional;Therapeutic activities;Balance training;DME and/or AE instruction;Manual Therapy;Psychosocial skills training    Plan  Rec continued OT 2x per week for 12 weeks    Consulted and Agree with Plan of Care  Patient       Patient will benefit from skilled therapeutic intervention in order to improve the following deficits and impairments:   Body Structure /  Function / Physical Skills: Strength, Gait, FMC, IADL, Balance, ADL, Coordination, Endurance, UE functional use, Mobility       Visit Diagnosis: Muscle weakness (generalized)  Other lack of coordination    Problem List Patient Active Problem List   Diagnosis Date Noted  . Vitamin D deficiency 10/29/2018  . Migraine without status migrainosus, not intractable   . Chronic pain syndrome   . Lumbar disc disease with radiculopathy 07/21/2017  . Lumbar foraminal stenosis 07/21/2017  . Unable to walk 07/21/2017  . Multiple falls 07/21/2017  . Displaced fracture of proximal end of right fibula 07/21/2017  . Lower extremity weakness 05/28/2017  . Right lower quadrant abdominal pain 08/22/2016  . Abnormal TSH 01/10/2016  . Morbid obesity ([place]) 01/12/2014  . Routine general medical examination at a health care facility 10/28/2012  . GERD (gastroesophageal reflux disease) 10/28/2012  . Anxiety 12/20/2011  . Allergic rhinitis 12/20/2011  . Asthma 12/20/2011  . Lapband APL with HH repair 03/31/2011  . Uncontrolled type 2 diabetes mellitus with hyperglycemia, with long-term current use of insulin ([place]) 06/30/2007  . Hyperlipidemia 06/30/2007  . Essential hypertension 06/27/2007    Harrel Carina, MS, OTR/L 12/25/2018, 11:34 AM  [place] MAIN [facility] SERVICES [address] [place], [place], [address] Phone: [phone]   Fax:  [phone]  Name: [name] MRN: [id number] Date of  Birth: Nov 10, 1965

## 2018-12-25 NOTE — Therapy (Signed)
[place] MAIN [facility] SERVICES [address] [place], [place], [address] Phone: [phone]   Fax:  [phone]  Physical Therapy Treatment  Patient Details  Name: [name] MRN: [id number] Date of Birth: 1965-06-05 Referring Provider (PT): Cornell Barman   Encounter Date: 12/25/2018  PT End of Session - 12/25/18 1157    Visit Number  32    Number of Visits  41    Date for PT Re-Evaluation  12/31/18    PT Start Time  9485    PT Stop Time  1225    PT Time Calculation (min)  40 min    Activity Tolerance  Patient limited by fatigue;Treatment limited secondary to medical complications (Comment);Other (comment)   SOB   Behavior During Therapy  WFL for tasks assessed/performed       Past Medical History:  Diagnosis Date  . Allergy   . Amyotrophic lateral sclerosis (ALS) ([place]) 09/23/2018  . Anemia   . Anxiety   . Arthritis   . Asthma   . Colon polyp   . Complication of anesthesia   . Diabetes mellitus   . Edema   . Fatigue   . Hypertension   . IBS (irritable bowel syndrome)   . Lump in female breast   . Methicillin resistant Staphylococcus aureus in conditions classified elsewhere and of unspecified site   . Migraines   . Morbid obesity ([place])   . Night sweats   . Nonspecific abnormal results of thyroid function study   . Other B-complex deficiencies   . Paresthesias   . PONV (postoperative nausea and vomiting)   . Pure hypercholesterolemia   . Reflux   . Sacral fracture ([place])   . Sinus problem   . Symptomatic states associated with artificial menopause   . Thyroid disease   . Type II or unspecified type diabetes mellitus without mention of complication, not stated as uncontrolled   . Unspecified sleep apnea     Past Surgical History:  Procedure Laterality Date  . abcess removal  1997   MRSA  . ABDOMINAL HYSTERECTOMY  1997   complete in 1997, partial was in 1995  . carpal tunnel release r  07/03/13   Dalldorf  .  [place]  . CHOLECYSTECTOMY  03/1989  . COLONOSCOPY  03/21/2011   Normal.  [facility].  [name] KNEE SURGERY  2006  . LAPAROSCOPIC GASTRIC BANDING  05/2009  . TONSILLECTOMY AND ADENOIDECTOMY  1974  . TUBAL LIGATION    . ULNAR TUNNEL RELEASE Right 02/05/2018   Procedure: CUBITAL TUNNEL RELEASE/DECOMPRESSION;  Surgeon: Melrose Nakayama, MD;  Location: [facility];  Service: Orthopedics;  Laterality: Right;    There were no vitals filed for this visit.  Subjective Assessment - 12/25/18 1156    Subjective  Pt reports feelign good today.[name] No pain to report this date.    Pertinent History   Patient fx her sacrum 9/18 and  she lost sensation to right foot, and she lost ability to DF her right foot. Patient had back surgery 03/2017 , she was sent home. She was walking with no device indoors and she could get out of the house and do the steps. June 18, 2017  her LLE was getting weaker and the right leg was getting stronger. Patient had follow up with neurosurgery. She was scheduled for a steriod shot Jul 23, 2017, but on May 3 her legs didnt  feel right, she fell 4 times, she fractured  her R fibula  on may 3rd, she went to hospital and spent one week. She went to SNF for 1 month. She had another neuro consult, resulting in kDx of  polyneuropathy. Then she had an apt with and [facility] said it was not polyneuropathy, (in [place])  She has a second consultation scheduled Dec 30 for a  Neuromuscular.consult.  She had a scan of the spine T 12-28-17  who then sent her to a thoracic surgeon at the end of November and was recommended to not due surgery. She does not know what her Dx is and she has been having home PT beginning August 25, 2017 . She has had HHPT 2 x  week for the last 5 months. She is able to walk 50 feet with RW. She is able to transfer from wc to stand independently.     Currently in Pain?  No/denies    Pain Score  0-No pain       treatment: Side stepping with RTB around ankles x 10 feet x 4  laps Sit to stand with 3 eccentric holds stand to sit  x 5  standing marching x 20 BLE  Step ups to 6 inch stool x 5 Standing hip extension with RTB x 15  X 2 BLE Eccentric step downs with 3 inch stool x 3 in parallel bars for support. Patient performs all exercises with minimal rest periods and has fatigue following each exercise.    Pt educated  about  posture and technique with exercises to improve exercise technique, movement at target joints, with min  verbal, visual, tactile cues                        PT Education - 12/25/18 1157    Education provided  Yes    Education Details  HEP    Person(s) Educated  Patient    Methods  Explanation;Demonstration    Comprehension  Verbalized understanding;Returned demonstration;Need further instruction       PT Short Term Goals - 10/23/18 1159      PT SHORT TERM GOAL #1   Title  Patient will be independent in home exercise program to improve strength/mobility for better functional independence with ADLs.    Time  6    Period  Weeks    Status  Partially Met    Target Date  09/10/18        PT Long Term Goals - 11/04/18 1513      PT LONG TERM GOAL #1   Title  Patient will increase 10 meter walk test to >.50 m/s as to improve gait speed for better community ambulation and to reduce fall risk.    Baseline  . 36 m/sec,, 10/23/18 =.43 m/ sec    Time  8    Period  Weeks    Status  Partially Met    Target Date  12/31/18      PT LONG TERM GOAL #2   Title  Patient will transfer sit to stand  from 19 inch mat without assist or anyone supporting AD  to demonstrate improved LE strength to decrease falls risk.     Time  12    Period  Weeks    Status  Achieved      PT LONG TERM GOAL #3   Title  Patient will demonstrate stance without AD  x 1 min to demonstrate decreased falls risk.     Baseline  10/23/18 =  Time  12    Period  Weeks    Status  Achieved    Target Date  12/31/18      PT LONG TERM GOAL #4   Title   Patient will  ambulate 500 feet to work towards community ambulation distances.     Baseline  270 feet, 10/23/18= 305 feet    Time  12    Period  Weeks    Status  Partially Met    Target Date  12/31/18      PT LONG TERM GOAL #5   Title  Patient (< 28 years old) will complete five times sit to stand test in < 10 seconds indicating an increased LE strength and improved balance.    Baseline  20.28 m/sec. 10/23/18 =16.82 ( from green chair)    Time  12    Period  Weeks    Status  Partially Met    Target Date  12/31/18      Additional Long Term Goals   Additional Long Term Goals  Yes      PT LONG TERM GOAL #6   Title  Patient will report a worst pain of 3/10 on VAS in left knee             to improve tolerance with ADLs and reduced symptoms with activities.    Time  12    Period  Weeks    Status  New    Target Date  12/31/18            Plan - 12/25/18 1158    Clinical Impression Statement  Patient performs standing exercises with RTB in open chain with increased reps and better ROM bilaterally. She has no pain behaviors and is able to perform transfers from lower surfaces and less stability assist  from therapist. She will continue to benefit form skilled PT to improve mobiltiy and strength and safety.    Rehab Potential  Good    PT Frequency  2x / week    PT Duration  12 weeks    PT Treatment/Interventions  Gait training;Therapeutic activities;Therapeutic exercise;Balance training;Neuromuscular re-education;Patient/family education;Orthotic Fit/Training;Manual techniques    PT Next Visit Plan  Continue with current program.    PT Home Exercise Plan  No updates this date     Consulted and Agree with Plan of Care  Patient    Family Member Consulted  Spouse        Patient will benefit from skilled therapeutic intervention in order to improve the following deficits and impairments:  Decreased balance, Decreased endurance, Decreased mobility, Difficulty walking, Impaired sensation,  Decreased range of motion, Decreased activity tolerance, Decreased coordination, Decreased strength, Pain, Postural dysfunction  Visit Diagnosis: Other lack of coordination  Pain in right ankle and joints of right foot  Difficulty in walking, not elsewhere classified  Weakness of right leg     Problem List Patient Active Problem List   Diagnosis Date Noted  . Vitamin D deficiency 10/29/2018  . Migraine without status migrainosus, not intractable   . Chronic pain syndrome   . Lumbar disc disease with radiculopathy 07/21/2017  . Lumbar foraminal stenosis 07/21/2017  . Unable to walk 07/21/2017  . Multiple falls 07/21/2017  . Displaced fracture of proximal end of right fibula 07/21/2017  . Lower extremity weakness 05/28/2017  . Right lower quadrant abdominal pain 08/22/2016  . Abnormal TSH 01/10/2016  . Morbid obesity ([place]) 01/12/2014  . Routine general medical examination at a health care facility 10/28/2012  .  GERD (gastroesophageal reflux disease) 10/28/2012  . Anxiety 12/20/2011  . Allergic rhinitis 12/20/2011  . Asthma 12/20/2011  . Lapband APL with HH repair 03/31/2011  . Uncontrolled type 2 diabetes mellitus with hyperglycemia, with long-term current use of insulin ([place]) 06/30/2007  . Hyperlipidemia 06/30/2007  . Essential hypertension 06/27/2007    Alanson Puls, PT DPT 12/25/2018, 12:05 PM  [place] MAIN [facility] SERVICES [address] [place], [place], [address] Phone: [phone]   Fax:  [phone]  Name: BEATRIC FULOP MRN: [id number] Date of Birth: 1966/02/08

## 2018-12-30 ENCOUNTER — Other Ambulatory Visit: Payer: Self-pay

## 2018-12-30 ENCOUNTER — Encounter: Payer: Self-pay | Admitting: Occupational Therapy

## 2018-12-30 ENCOUNTER — Ambulatory Visit: Payer: BC Managed Care – PPO | Admitting: Physical Therapy

## 2018-12-30 ENCOUNTER — Encounter: Payer: Self-pay | Admitting: Physical Therapy

## 2018-12-30 ENCOUNTER — Ambulatory Visit: Payer: BC Managed Care – PPO | Admitting: Occupational Therapy

## 2018-12-30 DIAGNOSIS — M6281 Muscle weakness (generalized): Secondary | ICD-10-CM

## 2018-12-30 DIAGNOSIS — M25551 Pain in right hip: Secondary | ICD-10-CM

## 2018-12-30 DIAGNOSIS — R278 Other lack of coordination: Secondary | ICD-10-CM

## 2018-12-30 DIAGNOSIS — R29898 Other symptoms and signs involving the musculoskeletal system: Secondary | ICD-10-CM

## 2018-12-30 DIAGNOSIS — M25571 Pain in right ankle and joints of right foot: Secondary | ICD-10-CM

## 2018-12-30 DIAGNOSIS — R262 Difficulty in walking, not elsewhere classified: Secondary | ICD-10-CM

## 2018-12-30 NOTE — Therapy (Signed)
[place] MAIN [facility] SERVICES [address] [place], [place], [address] Phone: [phone]   Fax:  [phone]  Physical Therapy Treatment  Patient Details  Name: [name] MRN: [id number] Date of Birth: 1965/09/27 Referring Provider (PT): Cornell Barman   Encounter Date: 12/30/2018  PT End of Session - 12/30/18 1330    Visit Number  33    Number of Visits  41    Date for PT Re-Evaluation  12/31/18    PT Start Time  8032    PT Stop Time  1225    PT Time Calculation (min)  40 min    Activity Tolerance  Patient limited by fatigue;Treatment limited secondary to medical complications (Comment);Other (comment)   SOB   Behavior During Therapy  WFL for tasks assessed/performed       Past Medical History:  Diagnosis Date  . Allergy   . Amyotrophic lateral sclerosis (ALS) ([place]) 09/23/2018  . Anemia   . Anxiety   . Arthritis   . Asthma   . Colon polyp   . Complication of anesthesia   . Diabetes mellitus   . Edema   . Fatigue   . Hypertension   . IBS (irritable bowel syndrome)   . Lump in female breast   . Methicillin resistant Staphylococcus aureus in conditions classified elsewhere and of unspecified site   . Migraines   . Morbid obesity ([place])   . Night sweats   . Nonspecific abnormal results of thyroid function study   . Other B-complex deficiencies   . Paresthesias   . PONV (postoperative nausea and vomiting)   . Pure hypercholesterolemia   . Reflux   . Sacral fracture ([place])   . Sinus problem   . Symptomatic states associated with artificial menopause   . Thyroid disease   . Type II or unspecified type diabetes mellitus without mention of complication, not stated as uncontrolled   . Unspecified sleep apnea     Past Surgical History:  Procedure Laterality Date  . abcess removal  1997   MRSA  . ABDOMINAL HYSTERECTOMY  1997   complete in 1997, partial was in 1995  . carpal tunnel release r  07/03/13   [facility]alldorf  .  [place]  . CHOLECYSTECTOMY  03/1989  . COLONOSCOPY  03/21/2011   Normal.  [facility].  [name] KNEE SURGERY  2006  . LAPAROSCOPIC GASTRIC BANDING  05/2009  . TONSILLECTOMY AND ADENOIDECTOMY  1974  . TUBAL LIGATION    . ULNAR TUNNEL RELEASE Right 02/05/2018   Procedure: CUBITAL TUNNEL RELEASE/DECOMPRESSION;  Surgeon: Melrose Nakayama, MD;  Location: [place];  Service: Orthopedics;  Laterality: Right;    There were no vitals filed for this visit.  Subjective Assessment - 12/30/18 1330    Subjective  Pt reports feelign good today.[name] No pain to report this date.    Pertinent History   Patient fx her sacrum 9/18 and  she lost sensation to right foot, and she lost ability to DF her right foot. Patient had back surgery 03/2017 , she was sent home. She was walking with no device indoors and she could get out of the house and do the steps. June 18, 2017  her LLE was getting weaker and the right leg was getting stronger. Patient had follow up with neurosurgery. She was scheduled for a steriod shot Jul 23, 2017, but on May 3 her legs didnt  feel right, she fell 4 times, she fractured  her R fibula  on may 3rd, she went to hospital and spent one week. She went to SNF for 1 month. She had another neuro consult, resulting in kDx of  polyneuropathy. Then she had an apt with and [facility] said it was not polyneuropathy, (in [place])  She has a second consultation scheduled Dec 30 for a  Neuromuscular.consult.  She had a scan of the spine T 12-28-17  who then sent her to a thoracic surgeon at the end of November and was recommended to not due surgery. She does not know what her Dx is and she has been having home PT beginning August 25, 2017 . She has had HHPT 2 x  week for the last 5 months. She is able to walk 50 feet with RW. She is able to transfer from wc to stand independently.     Currently in Pain?  No/denies    Pain Score  0-No pain       Treatment: Side stepping with RTB around ankles x 10 feet x 4  laps, cues for keeping her foot from ER Sit to stand with 3 eccentric holds stand to sit  x 5 , cues to lean fwd for weight over her feet standing marching x 20 BLE  Step ups to 6 inch stool x 5, cues for posture Standing hip extension with RTB x 15  X 2 BLE, cues to keep her shoulders upright Eccentric step downs with 3 inch stool x 3 in parallel bars for support. Patient performs all exercises with minimal rest periods and has fatigue following each exercise.   Pt educated about posture and technique with exercises to improve exercise technique, movement at target joints, with min verbal, visual, tactile cues                         PT Education - 12/30/18 1330    Education provided  Yes    Education Details  HEP    Person(s) Educated  Patient    Methods  Explanation    Comprehension  Verbalized understanding;Returned demonstration;Verbal cues required       PT Short Term Goals - 10/23/18 1159      PT SHORT TERM GOAL #1   Title  Patient will be independent in home exercise program to improve strength/mobility for better functional independence with ADLs.    Time  6    Period  Weeks    Status  Partially Met    Target Date  09/10/18        PT Long Term Goals - 11/04/18 1513      PT LONG TERM GOAL #1   Title  Patient will increase 10 meter walk test to >.50 m/s as to improve gait speed for better community ambulation and to reduce fall risk.    Baseline  . 36 m/sec,, 10/23/18 =.43 m/ sec    Time  8    Period  Weeks    Status  Partially Met    Target Date  12/31/18      PT LONG TERM GOAL #2   Title  Patient will transfer sit to stand  from 19 inch mat without assist or anyone supporting AD  to demonstrate improved LE strength to decrease falls risk.     Time  12    Period  Weeks    Status  Achieved      PT LONG TERM GOAL #3   Title  Patient will demonstrate  stance without AD  x 1 min to demonstrate decreased falls risk.     Baseline  10/23/18 =     Time  12    Period  Weeks    Status  Achieved    Target Date  12/31/18      PT LONG TERM GOAL #4   Title  Patient will  ambulate 500 feet to work towards community ambulation distances.     Baseline  270 feet, 10/23/18= 305 feet    Time  12    Period  Weeks    Status  Partially Met    Target Date  12/31/18      PT LONG TERM GOAL #5   Title  Patient (< 37 years old) will complete five times sit to stand test in < 10 seconds indicating an increased LE strength and improved balance.    Baseline  20.28 m/sec. 10/23/18 =16.82 ( from green chair)    Time  12    Period  Weeks    Status  Partially Met    Target Date  12/31/18      Additional Long Term Goals   Additional Long Term Goals  Yes      PT LONG TERM GOAL #6   Title  Patient will report a worst pain of 3/10 on VAS in left knee             to improve tolerance with ADLs and reduced symptoms with activities.    Time  12    Period  Weeks    Status  New    Target Date  12/31/18            Plan - 12/30/18 1332    Clinical Impression Statement  Patient performs intermediate standing exercises for LE strengthening. Patient has poor lateral step coordination and needs CGA for loss of balance. Patient will continue to benefit from skilled PT to improve mobility.    Rehab Potential  Good    PT Frequency  2x / week    PT Duration  12 weeks    PT Treatment/Interventions  Gait training;Therapeutic activities;Therapeutic exercise;Balance training;Neuromuscular re-education;Patient/family education;Orthotic Fit/Training;Manual techniques    PT Next Visit Plan  Continue with current program.    PT Home Exercise Plan  No updates this date     Consulted and Agree with Plan of Care  Patient    Family Member Consulted  Spouse        Patient will benefit from skilled therapeutic intervention in order to improve the following deficits and impairments:  Decreased balance, Decreased endurance, Decreased mobility, Difficulty walking,  Impaired sensation, Decreased range of motion, Decreased activity tolerance, Decreased coordination, Decreased strength, Pain, Postural dysfunction  Visit Diagnosis: Muscle weakness (generalized)  Other lack of coordination  Pain in right ankle and joints of right foot  Difficulty in walking, not elsewhere classified  Weakness of right leg  Pain in right hip     Problem List Patient Active Problem List   Diagnosis Date Noted  . Vitamin D deficiency 10/29/2018  . Migraine without status migrainosus, not intractable   . Chronic pain syndrome   . Lumbar disc disease with radiculopathy 07/21/2017  . Lumbar foraminal stenosis 07/21/2017  . Unable to walk 07/21/2017  . Multiple falls 07/21/2017  . Displaced fracture of proximal end of right fibula 07/21/2017  . Lower extremity weakness 05/28/2017  . Right lower quadrant abdominal pain 08/22/2016  . Abnormal TSH 01/10/2016  . Morbid obesity ([place]) 01/12/2014  .  Routine general medical examination at a health care facility 10/28/2012  . GERD (gastroesophageal reflux disease) 10/28/2012  . Anxiety 12/20/2011  . Allergic rhinitis 12/20/2011  . Asthma 12/20/2011  . Lapband APL with HH repair 03/31/2011  . Uncontrolled type 2 diabetes mellitus with hyperglycemia, with long-term current use of insulin ([place]) 06/30/2007  . Hyperlipidemia 06/30/2007  . Essential hypertension 06/27/2007    Alanson Puls, PT DPT 12/30/2018, 1:44 PM  [place] MAIN [facility] SERVICES [address] [place], [place], [address] Phone: [phone]   Fax:  [phone]  Name: [name] MRN: [id number] Date of Birth: Jul 24, 1965

## 2018-12-30 NOTE — Therapy (Signed)
[place] MAIN [facility] SERVICES [address] [name], [place], [address] Phone: [phone]   Fax:  [phone]  Occupational Therapy Treatment  Patient Details  Name: [name] MRN: [id number] Date of Birth: 1965-06-30 Referring Provider (OT): Alma Friendly, MD   Encounter Date: 12/30/2018  OT End of Session - 12/30/18 1109    Visit Number  18    Number of Visits  24    Date for OT Re-Evaluation  03/05/19    Authorization Type  [employer] progress report period starting 11/18/18    Authorization Time Period  09/24/18-12/17/18    OT Start Time  1102    OT Stop Time  1145    OT Time Calculation (min)  43 min    Activity Tolerance  Patient tolerated treatment well    Behavior During Therapy  [facility] for tasks assessed/performed       Past Medical History:  Diagnosis Date  . Allergy   . Amyotrophic lateral sclerosis (ALS) ([place]) 09/23/2018  . Anemia   . Anxiety   . Arthritis   . Asthma   . Colon polyp   . Complication of anesthesia   . Diabetes mellitus   . Edema   . Fatigue   . Hypertension   . IBS (irritable bowel syndrome)   . Lump in female breast   . Methicillin resistant Staphylococcus aureus in conditions classified elsewhere and of unspecified site   . Migraines   . Morbid obesity ([place])   . Night sweats   . Nonspecific abnormal results of thyroid function study   . Other B-complex deficiencies   . Paresthesias   . PONV (postoperative nausea and vomiting)   . Pure hypercholesterolemia   . Reflux   . Sacral fracture ([place])   . Sinus problem   . Symptomatic states associated with artificial menopause   . Thyroid disease   . Type II or unspecified type diabetes mellitus without mention of complication, not stated as uncontrolled   . Unspecified sleep apnea     Past Surgical History:  Procedure Laterality Date  . abcess removal  1997   MRSA  . ABDOMINAL HYSTERECTOMY  1997   complete in 1997, partial was in 1995  . carpal  tunnel release r  07/03/13   [facility]  . [place]  . CHOLECYSTECTOMY  03/1989  . COLONOSCOPY  03/21/2011   Normal.  [facility].  [name] KNEE SURGERY  2006  . LAPAROSCOPIC GASTRIC BANDING  05/2009  . TONSILLECTOMY AND ADENOIDECTOMY  1974  . TUBAL LIGATION    . ULNAR TUNNEL RELEASE Right 02/05/2018   Procedure: CUBITAL TUNNEL RELEASE/DECOMPRESSION;  Surgeon: Melrose Nakayama, MD;  Location: [place];  Service: Orthopedics;  Laterality: Right;    There were no vitals filed for this visit.  Subjective Assessment - 12/30/18 1108    Subjective   Pt. reports that she is doing well.    Patient is accompanied by:  Family member    Pertinent History  [name] was recently diagnosed with ALS at [facility].  She has had a recent decrease in strength in L side of body, UE and LE with decreased coordination.  She feels like she is dropping cups more and needs to use both hands for hand to mouth and handwriting has decreased to almost illegible.    Patient Stated Goals  I want to work on handwriting, being able to hold a cup again and regain strength and  coordination in both hands.    Currently in Pain?  No/denies      OT TREATMENT    Neuro muscular re-education:  Pt. worked on grasping, and manipulating 1/2" washers from a magnetic dish using point grasp pattern. Pt. worked on reaching up, stabilizing, and sustaining shoulder elevation while placing the washer over a small precise target on vertical dowels positioned at various angles. Pt. performed bilateral [facility] tasks using the Grooved pegboard. Pt. worked on grasping the grooved pegs from a horizontal position, and moving the pegs to a vertical position in the hand to prepare for placing them in the grooved slot. Pt. worked on [facility] skills grasping 1" sticks, 1/4" collars, and 1/4" washers. Pt. worked on storing the objects in the palm, and translatory skills moving the items from the palm of the hand to the tip of the 2nd digit, and  thumb. Pt. worked on removing the pegs using bilateral alternating hand patterns.  Response to Treatment  Pt. reports having nembness in the ulnar aspect of the biltaeral UEs, and hands. Pt. reports that the left UE numbness is now getting worse, and had pain over the weekend. Pt. Continues to perform intrinsic stretches at home. Pt. dropped multiple objects when attempting to perform translatory movements of the hand. Pt. continues to work on improving BUE [facility] skills in order to improve UE finctioning during ADLs, and IADLs.                      OT Education - 12/30/18 1108    Education Details  [facility]    Person(s) Educated  Patient    Methods  Explanation;Demonstration;Verbal cues    Comprehension  Verbalized understanding;Returned demonstration          OT Long Term Goals - 12/12/18 2117      OT LONG TERM GOAL #1   Title  Pt will complete self feeding using adaptive utensils and compenstory techniques using R hand.    Baseline  Pt needs help with holding cup, cutting meat and uses L hand as assist.    Time  12    Period  Weeks    Status  Achieved      OT LONG TERM GOAL #2   Title  Pt will complete LB dressing using adaptive equipment with min assist and cues in unsupported sitting.    Baseline  Now able to do her socks, pants but needs help with AFOs and shoes 12/11/18    Time  12    Period  Weeks    Status  On-going    Target Date  03/05/19      OT LONG TERM GOAL #3   Title  Pt will complete toileting and hygiene with adaptive aid with min assist and cues on elevated toilet with use of grab bar and FWW for balance.    Baseline  improving but still requires occasional assist    Time  12    Period  Weeks    Status  On-going    Target Date  03/05/19      OT LONG TERM GOAL #4   Title  Pt will be educated in HEP for strengthening and fine motor coordination.    Baseline  ongoing additions to home program    Time  12    Period  Weeks    Status  On-going     Target Date  03/05/19      OT LONG TERM GOAL #5  Title  Pt will increase strength  in B hands for use during ADLs by 3#    Baseline  exceeded initial goal, revised to 3 additional #    Time  12    Period  Weeks    Status  On-going    Target Date  03/05/19      Long Term Additional Goals   Additional Long Term Goals  Yes      OT LONG TERM GOAL #6   Title  Pt will increase strength 2# in B pinch for lateral and 3 point for use during ADLs.    Baseline  minimal changes in pinch 12/11/2018    Time  12    Period  Weeks    Status  Achieved      OT LONG TERM GOAL #7   Title  Patient will demonstrate improved fine motor coordination skills to minimize dropping items such as pills    Baseline  drops items frequently    Time  12    Period  Weeks    Status  New    Target Date  03/05/19      OT LONG TERM GOAL #8   Title  Patient to demonstrate ability to manipulate buttons with or without use of adaptive equipment.    Baseline  unable to perform buttons and requires assist.    Time  12    Period  Weeks    Status  New    Target Date  03/05/19            Plan - 12/30/18 1109    Clinical Impression Statement Pt. reports having nembness in the ulnar aspect of the biltaeral UEs, and hands. Pt. reports that the left UE numbness is now getting worse, and had pain over the weekend. Pt. Continues to perform intrinsic stretches at home. Pt. dropped multiple objects when attempting to perform translatory movements of the hand. Pt. continues to work on improving BUE [facility] skills in order to improve UE finctioning during ADLs, and IADLs.    OT Occupational Profile and History  Detailed Assessment- Review of Records and additional review of physical, cognitive, psychosocial history related to current functional performance    Occupational performance deficits (Please refer to evaluation for details):  ADL's;IADL's;Education;Leisure    Body Structure / Function / Physical Skills   Strength;Gait;FMC;IADL;Balance;ADL;Coordination;Endurance;UE functional use;Mobility    Rehab Potential  Good    Clinical Decision Making  Several treatment options, min-mod task modification necessary    Comorbidities Affecting Occupational Performance:  Presence of comorbidities impacting occupational performance    Comorbidities impacting occupational performance description:  progressive muscle weakness, balance, coordination    Modification or Assistance to Complete Evaluation   Min-Moderate modification of tasks or assist with assess necessary to complete eval    OT Frequency  2x / week    OT Duration  12 weeks    OT Treatment/Interventions  Moist Heat;Self-care/ADL training;Therapeutic exercise;Patient/family education;Energy conservation;Therapist, nutritional;Therapeutic activities;Balance training;DME and/or AE instruction;Manual Therapy;Psychosocial skills training    Plan  Rec continued OT 2x per week for 12 weeks    OT Home Exercise Plan  Pt given medium/red theraputty for home strengthening exercises    Consulted and Agree with Plan of Care  Patient       Patient will benefit from skilled therapeutic intervention in order to improve the following deficits and impairments:   Body Structure / Function / Physical Skills: Strength, Gait, FMC, IADL, Balance, ADL, Coordination, Endurance, UE functional use,  Mobility       Visit Diagnosis: Muscle weakness (generalized)  Other lack of coordination    Problem List Patient Active Problem List   Diagnosis Date Noted  . Vitamin D deficiency 10/29/2018  . Migraine without status migrainosus, not intractable   . Chronic pain syndrome   . Lumbar disc disease with radiculopathy 07/21/2017  . Lumbar foraminal stenosis 07/21/2017  . Unable to walk 07/21/2017  . Multiple falls 07/21/2017  . Displaced fracture of proximal end of right fibula 07/21/2017  . Lower extremity weakness 05/28/2017  . Right lower quadrant abdominal  pain 08/22/2016  . Abnormal TSH 01/10/2016  . Morbid obesity ([place]) 01/12/2014  . Routine general medical examination at a health care facility 10/28/2012  . GERD (gastroesophageal reflux disease) 10/28/2012  . Anxiety 12/20/2011  . Allergic rhinitis 12/20/2011  . Asthma 12/20/2011  . Lapband APL with HH repair 03/31/2011  . Uncontrolled type 2 diabetes mellitus with hyperglycemia, with long-term current use of insulin ([place]) 06/30/2007  . Hyperlipidemia 06/30/2007  . Essential hypertension 06/27/2007    Harrel Carina, MS, OTR/L 12/30/2018, 11:24 AM  [place] MAIN [facility] SERVICES [address] [place], [place], [address] Phone: [phone]   Fax:  [phone]  Name: EVERLEIGH ESPELAND MRN: [id number] Date of Birth: 1966-03-05

## 2019-01-01 ENCOUNTER — Ambulatory Visit: Payer: BC Managed Care – PPO | Admitting: Occupational Therapy

## 2019-01-01 ENCOUNTER — Ambulatory Visit: Payer: BC Managed Care – PPO | Admitting: Physical Therapy

## 2019-01-06 ENCOUNTER — Ambulatory Visit: Payer: BC Managed Care – PPO | Admitting: Occupational Therapy

## 2019-01-06 ENCOUNTER — Encounter: Payer: Self-pay | Admitting: Physical Therapy

## 2019-01-06 ENCOUNTER — Other Ambulatory Visit: Payer: Self-pay

## 2019-01-06 ENCOUNTER — Ambulatory Visit: Payer: BC Managed Care – PPO | Admitting: Physical Therapy

## 2019-01-06 ENCOUNTER — Encounter: Payer: Self-pay | Admitting: Occupational Therapy

## 2019-01-06 DIAGNOSIS — M6281 Muscle weakness (generalized): Secondary | ICD-10-CM

## 2019-01-06 DIAGNOSIS — R278 Other lack of coordination: Secondary | ICD-10-CM

## 2019-01-06 DIAGNOSIS — M25551 Pain in right hip: Secondary | ICD-10-CM

## 2019-01-06 DIAGNOSIS — R262 Difficulty in walking, not elsewhere classified: Secondary | ICD-10-CM

## 2019-01-06 DIAGNOSIS — R29898 Other symptoms and signs involving the musculoskeletal system: Secondary | ICD-10-CM

## 2019-01-06 DIAGNOSIS — M25571 Pain in right ankle and joints of right foot: Secondary | ICD-10-CM

## 2019-01-06 NOTE — Therapy (Signed)
[place] MAIN [facility] SERVICES [address] [place], [place], [address] Phone: [phone]   Fax:  [phone]  Occupational Therapy Treatment  Patient Details  Name: [name] MRN: [id number] Date of Birth: 1966-01-13 Referring Provider (OT): Alma Friendly, MD   Encounter Date: 01/06/2019  OT End of Session - 01/06/19 1107    Visit Number  19    Number of Visits  24    Date for OT Re-Evaluation  03/05/19    Authorization Type  [employer] progress report period starting 11/18/18    Authorization Time Period  09/24/18-12/17/18    OT Start Time  1103    OT Stop Time  1145    OT Time Calculation (min)  42 min    Activity Tolerance  Patient tolerated treatment well    Behavior During Therapy  [facility] for tasks assessed/performed       Past Medical History:  Diagnosis Date  . Allergy   . Amyotrophic lateral sclerosis (ALS) ([place]) 09/23/2018  . Anemia   . Anxiety   . Arthritis   . Asthma   . Colon polyp   . Complication of anesthesia   . Diabetes mellitus   . Edema   . Fatigue   . Hypertension   . IBS (irritable bowel syndrome)   . Lump in female breast   . Methicillin resistant Staphylococcus aureus in conditions classified elsewhere and of unspecified site   . Migraines   . Morbid obesity ([place])   . Night sweats   . Nonspecific abnormal results of thyroid function study   . Other B-complex deficiencies   . Paresthesias   . PONV (postoperative nausea and vomiting)   . Pure hypercholesterolemia   . Reflux   . Sacral fracture ([place])   . Sinus problem   . Symptomatic states associated with artificial menopause   . Thyroid disease   . Type II or unspecified type diabetes mellitus without mention of complication, not stated as uncontrolled   . Unspecified sleep apnea     Past Surgical History:  Procedure Laterality Date  . abcess removal  1997   MRSA  . ABDOMINAL HYSTERECTOMY  1997   complete in 1997, partial was in 1995  . carpal  tunnel release r  07/03/13   [facility]  . [place]  . CHOLECYSTECTOMY  03/1989  . COLONOSCOPY  03/21/2011   Normal.  [facility].  [name] KNEE SURGERY  2006  . LAPAROSCOPIC GASTRIC BANDING  05/2009  . TONSILLECTOMY AND ADENOIDECTOMY  1974  . TUBAL LIGATION    . ULNAR TUNNEL RELEASE Right 02/05/2018   Procedure: CUBITAL TUNNEL RELEASE/DECOMPRESSION;  Surgeon: Melrose Nakayama, MD;  Location: [facility];  Service: Orthopedics;  Laterality: Right;    There were no vitals filed for this visit.  Subjective Assessment - 01/06/19 1107    Subjective   Pt. reports that she is doing well.    Patient is accompanied by:  Family member    Pertinent History  [name] was recently diagnosed with ALS at [facility] [facility].  She has had a recent decrease in strength in L side of body, UE and LE with decreased coordination.  She feels like she is dropping cups more and needs to use both hands for hand to mouth and handwriting has decreased to almost illegible.    Patient Stated Goals  I want to work on handwriting, being able to hold a cup again and regain strength and  coordination in both hands.    Currently in Pain?  No/denies       OT TREATMENT    Therapeutic Exercise:  Pt. worked on bilateral hand [facility] skills. Pt. worked on grasping 1/2" small pegs, and storing them in the palm of her hand. Pt. worked on moving them through the palm of her hand and placing them into the pegboard.  Pt. worked on removing the pegs while alternating thumb opposition to the tip of her 2nd through 4th digits. Pt. performed [facility] tasks using the Grooved pegboard. Pt. worked on grasping the grooved pegs from a horizontal position, and moving the pegs to a vertical position in the hand to prepare for placing them in the grooved slot. Pt. worked on grasping, and manipulating 1/2" beads, and threading them through the shoelaces.   Response to Treatment:   Pt. is making progress, and is able to use her hands to cut items  during meal preparation, and handling utensils. Pt. was able to open a bottle for the first time in a very long time. Pt. continues to present with bilateral hand functioning, motor control, and [facility] skills. Pt. Is improving with translatory movements of the hand. Pt. has difficulty with thumb opposition to the 4th digit, and is unable to use her thumb to the tip of her 5th digit. Pt. continues to work on improving bilateral [facility] skills, and UE functioning to improve ADL, and IADL functioning.                     OT Education - 01/06/19 1107    Education Details  [facility]    Person(s) Educated  Patient    Methods  Explanation;Demonstration;Verbal cues    Comprehension  Verbalized understanding;Returned demonstration          OT Long Term Goals - 12/12/18 2117      OT LONG TERM GOAL #1   Title  Pt will complete self feeding using adaptive utensils and compenstory techniques using R hand.    Baseline  Pt needs help with holding cup, cutting meat and uses L hand as assist.    Time  12    Period  Weeks    Status  Achieved      OT LONG TERM GOAL #2   Title  Pt will complete LB dressing using adaptive equipment with min assist and cues in unsupported sitting.    Baseline  Now able to do her socks, pants but needs help with AFOs and shoes 12/11/18    Time  12    Period  Weeks    Status  On-going    Target Date  03/05/19      OT LONG TERM GOAL #3   Title  Pt will complete toileting and hygiene with adaptive aid with min assist and cues on elevated toilet with use of grab bar and FWW for balance.    Baseline  improving but still requires occasional assist    Time  12    Period  Weeks    Status  On-going    Target Date  03/05/19      OT LONG TERM GOAL #4   Title  Pt will be educated in HEP for strengthening and fine motor coordination.    Baseline  ongoing additions to home program    Time  12    Period  Weeks    Status  On-going    Target Date  03/05/19      OT  LONG  TERM GOAL #5   Title  Pt will increase strength  in B hands for use during ADLs by 3#    Baseline  exceeded initial goal, revised to 3 additional #    Time  12    Period  Weeks    Status  On-going    Target Date  03/05/19      Long Term Additional Goals   Additional Long Term Goals  Yes      OT LONG TERM GOAL #6   Title  Pt will increase strength 2# in B pinch for lateral and 3 point for use during ADLs.    Baseline  minimal changes in pinch 12/11/2018    Time  12    Period  Weeks    Status  Achieved      OT LONG TERM GOAL #7   Title  Patient will demonstrate improved fine motor coordination skills to minimize dropping items such as pills    Baseline  drops items frequently    Time  12    Period  Weeks    Status  New    Target Date  03/05/19      OT LONG TERM GOAL #8   Title  Patient to demonstrate ability to manipulate buttons with or without use of adaptive equipment.    Baseline  unable to perform buttons and requires assist.    Time  12    Period  Weeks    Status  New    Target Date  03/05/19            Plan - 01/06/19 1108    Clinical Impression Statement  Pt. is making progress, and is able to use her hands to cut items during meal preparation, and handling utensils. Pt. was able to open a bottle for the first time in a very long time. Pt. continues to present with bilateral hand functioning, motor control, and [facility] skills. Pt. continues to work on improving bilateral [facility] skills, and UE functioning to improve ADL, and IADL functioning.    OT Occupational Profile and History  Detailed Assessment- Review of Records and additional review of physical, cognitive, psychosocial history related to current functional performance    Occupational performance deficits (Please refer to evaluation for details):  ADL's;IADL's;Education;Leisure    Body Structure / Function / Physical Skills  Strength;Gait;FMC;IADL;Balance;ADL;Coordination;Endurance;UE functional use;Mobility     Rehab Potential  Good    Clinical Decision Making  Several treatment options, min-mod task modification necessary    Comorbidities Affecting Occupational Performance:  Presence of comorbidities impacting occupational performance    Comorbidities impacting occupational performance description:  progressive muscle weakness, balance, coordination    Modification or Assistance to Complete Evaluation   Min-Moderate modification of tasks or assist with assess necessary to complete eval    OT Frequency  2x / week    OT Duration  12 weeks    OT Treatment/Interventions  Moist Heat;Self-care/ADL training;Therapeutic exercise;Patient/family education;Energy conservation;Therapist, nutritional;Therapeutic activities;Balance training;DME and/or AE instruction;Manual Therapy;Psychosocial skills training    Consulted and Agree with Plan of Care  Patient       Patient will benefit from skilled therapeutic intervention in order to improve the following deficits and impairments:   Body Structure / Function / Physical Skills: Strength, Gait, FMC, IADL, Balance, ADL, Coordination, Endurance, UE functional use, Mobility       Visit Diagnosis: Muscle weakness (generalized)  Other lack of coordination    Problem List Patient Active Problem List  Diagnosis Date Noted  . Vitamin D deficiency 10/29/2018  . Migraine without status migrainosus, not intractable   . Chronic pain syndrome   . Lumbar disc disease with radiculopathy 07/21/2017  . Lumbar foraminal stenosis 07/21/2017  . Unable to walk 07/21/2017  . Multiple falls 07/21/2017  . Displaced fracture of proximal end of right fibula 07/21/2017  . Lower extremity weakness 05/28/2017  . Right lower quadrant abdominal pain 08/22/2016  . Abnormal TSH 01/10/2016  . Morbid obesity ([place]) 01/12/2014  . Routine general medical examination at a health care facility 10/28/2012  . GERD (gastroesophageal reflux disease) 10/28/2012  . Anxiety 12/20/2011   . Allergic rhinitis 12/20/2011  . Asthma 12/20/2011  . Lapband APL with HH repair 03/31/2011  . Uncontrolled type 2 diabetes mellitus with hyperglycemia, with long-term current use of insulin ([place]) 06/30/2007  . Hyperlipidemia 06/30/2007  . Essential hypertension 06/27/2007    Harrel Carina, MS, OTR/L 01/06/2019, 11:21 AM  [place] MAIN [facility] SERVICES [address] [place], [place], [address] Phone: [phone]   Fax:  [phone]  Name: [name] MRN: [id number] Date of Birth: [birth year]

## 2019-01-06 NOTE — Therapy (Signed)
[place] MAIN [facility] SERVICES [address] [place], [place], [address] Phone: [phone]   Fax:  [phone]  Physical Therapy Treatment  Patient Details  Name: [name] MRN: [id number] Date of Birth: [birth year] Referring Provider (PT): Cornell Barman   Encounter Date: 01/06/2019  PT End of Session - 01/06/19 1159    Visit Number  34    Number of Visits  41    Date for PT Re-Evaluation  12/31/18    PT Start Time  4825    PT Stop Time  1225    PT Time Calculation (min)  40 min    Activity Tolerance  Patient limited by fatigue;Treatment limited secondary to medical complications (Comment);Other (comment)   SOB   Behavior During Therapy  WFL for tasks assessed/performed       Past Medical History:  Diagnosis Date  . Allergy   . Amyotrophic lateral sclerosis (ALS) ([place]) 09/23/2018  . Anemia   . Anxiety   . Arthritis   . Asthma   . Colon polyp   . Complication of anesthesia   . Diabetes mellitus   . Edema   . Fatigue   . Hypertension   . IBS (irritable bowel syndrome)   . Lump in female breast   . Methicillin resistant Staphylococcus aureus in conditions classified elsewhere and of unspecified site   . Migraines   . Morbid obesity ([place])   . Night sweats   . Nonspecific abnormal results of thyroid function study   . Other B-complex deficiencies   . Paresthesias   . PONV (postoperative nausea and vomiting)   . Pure hypercholesterolemia   . Reflux   . Sacral fracture ([place])   . Sinus problem   . Symptomatic states associated with artificial menopause   . Thyroid disease   . Type II or unspecified type diabetes mellitus without mention of complication, not stated as uncontrolled   . Unspecified sleep apnea     Past Surgical History:  Procedure Laterality Date  . abcess removal  1997   MRSA  . ABDOMINAL HYSTERECTOMY  1997   complete in 1997, partial was in 1995  . carpal tunnel release r  07/03/13   Dalldorf  .  [place]  . CHOLECYSTECTOMY  03/1989  . COLONOSCOPY  03/21/2011   Normal.  [facility].  [name] KNEE SURGERY  2006  . LAPAROSCOPIC GASTRIC BANDING  05/2009  . TONSILLECTOMY AND ADENOIDECTOMY  1974  . TUBAL LIGATION    . ULNAR TUNNEL RELEASE Right 02/05/2018   Procedure: CUBITAL TUNNEL RELEASE/DECOMPRESSION;  Surgeon: Melrose Nakayama, MD;  Location: [facility];  Service: Orthopedics;  Laterality: Right;    There were no vitals filed for this visit.  Subjective Assessment - 01/06/19 1158    Subjective  Pt reports feelign good today.[name] No pain to report this date.    Currently in Pain?  No/denies    Pain Score  0-No pain       Treatment:    NUSTEP 6 MIN seat 10. Arms, level 5, 6 minutes, cardiovascular challenge supervised for safety, min Guard assist to supervision for WC to/from NuSTEP 35f AMB c RW Nustep to high/low table  Hamstring stretching and gastroc / soleus stretch x 30 sec x 3 , BLE sidelying hip flex/ext x 10  Prone knee flex  LLE x 10 x 2, RLE AAROM x 10 x 2 sidelying hip abd 2x20 , ext x 15 BLE  Patient needs  assist to remove her short leg braces, cues for correct technique with exercises.  Patient has no reports of pain during treatment.                   PT Education - 01/06/19 1158    Education provided  Yes    Education Details  HEP    Person(s) Educated  Patient    Methods  Explanation;Demonstration    Comprehension  Returned demonstration;Need further instruction;Verbal cues required       PT Short Term Goals - 10/23/18 1159      PT SHORT TERM GOAL #1   Title  Patient will be independent in home exercise program to improve strength/mobility for better functional independence with ADLs.    Time  6    Period  Weeks    Status  Partially Met    Target Date  09/10/18        PT Long Term Goals - 11/04/18 1513      PT LONG TERM GOAL #1   Title  Patient will increase 10 meter walk test to >.50 m/s as to improve gait speed  for better community ambulation and to reduce fall risk.    Baseline  . 36 m/sec,, 10/23/18 =.43 m/ sec    Time  8    Period  Weeks    Status  Partially Met    Target Date  12/31/18      PT LONG TERM GOAL #2   Title  Patient will transfer sit to stand  from 19 inch mat without assist or anyone supporting AD  to demonstrate improved LE strength to decrease falls risk.     Time  12    Period  Weeks    Status  Achieved      PT LONG TERM GOAL #3   Title  Patient will demonstrate stance without AD  x 1 min to demonstrate decreased falls risk.     Baseline  10/23/18 =    Time  12    Period  Weeks    Status  Achieved    Target Date  12/31/18      PT LONG TERM GOAL #4   Title  Patient will  ambulate 500 feet to work towards community ambulation distances.     Baseline  270 feet, 10/23/18= 305 feet    Time  12    Period  Weeks    Status  Partially Met    Target Date  12/31/18      PT LONG TERM GOAL #5   Title  Patient (< 1 years old) will complete five times sit to stand test in < 10 seconds indicating an increased LE strength and improved balance.    Baseline  20.28 m/sec. 10/23/18 =16.82 ( from green chair)    Time  12    Period  Weeks    Status  Partially Met    Target Date  12/31/18      Additional Long Term Goals   Additional Long Term Goals  Yes      PT LONG TERM GOAL #6   Title  Patient will report a worst pain of 3/10 on VAS in left knee             to improve tolerance with ADLs and reduced symptoms with activities.    Time  12    Period  Weeks    Status  New    Target Date  12/31/18  Plan - 01/06/19 1200    Clinical Impression Statement  Pt requires direction and verbal cues for correct performance of exercise and activities. Patient demonstrates difficulties strengthening against gravity and closed chain activities and has no reports of pain increases. Pt was able to perform all exercises with min assist and VC for technique.   Patient struggles with  fatigue and repetitions.  Pt encouraged continuing HEP. Follow-up as scheduled.    Rehab Potential  Good    PT Frequency  2x / week    PT Duration  12 weeks    PT Treatment/Interventions  Gait training;Therapeutic activities;Therapeutic exercise;Balance training;Neuromuscular re-education;Patient/family education;Orthotic Fit/Training;Manual techniques    PT Next Visit Plan  Continue with current program.    PT Home Exercise Plan  No updates this date     Consulted and Agree with Plan of Care  Patient    Family Member Consulted  Spouse        Patient will benefit from skilled therapeutic intervention in order to improve the following deficits and impairments:  Decreased balance, Decreased endurance, Decreased mobility, Difficulty walking, Impaired sensation, Decreased range of motion, Decreased activity tolerance, Decreased coordination, Decreased strength, Pain, Postural dysfunction  Visit Diagnosis: Other lack of coordination  Pain in right ankle and joints of right foot  Muscle weakness (generalized)  Difficulty in walking, not elsewhere classified  Weakness of right leg  Pain in right hip     Problem List Patient Active Problem List   Diagnosis Date Noted  . Vitamin D deficiency 10/29/2018  . Migraine without status migrainosus, not intractable   . Chronic pain syndrome   . Lumbar disc disease with radiculopathy 07/21/2017  . Lumbar foraminal stenosis 07/21/2017  . Unable to walk 07/21/2017  . Multiple falls 07/21/2017  . Displaced fracture of proximal end of right fibula 07/21/2017  . Lower extremity weakness 05/28/2017  . Right lower quadrant abdominal pain 08/22/2016  . Abnormal TSH 01/10/2016  . Morbid obesity ([place]) 01/12/2014  . Routine general medical examination at a health care facility 10/28/2012  . GERD (gastroesophageal reflux disease) 10/28/2012  . Anxiety 12/20/2011  . Allergic rhinitis 12/20/2011  . Asthma 12/20/2011  . Lapband APL with HH repair  03/31/2011  . Uncontrolled type 2 diabetes mellitus with hyperglycemia, with long-term current use of insulin ([place]) 06/30/2007  . Hyperlipidemia 06/30/2007  . Essential hypertension 06/27/2007    Alanson Puls, PT DPT 01/06/2019, 12:03 PM  [place] MAIN [facility] SERVICES [address] [place], [place], [address] Phone: [phone]   Fax:  [phone]  Name: [name] MRN: [id number] Date of Birth: 1966-03-07

## 2019-01-08 ENCOUNTER — Encounter: Payer: Self-pay | Admitting: Occupational Therapy

## 2019-01-08 ENCOUNTER — Other Ambulatory Visit: Payer: Self-pay

## 2019-01-08 ENCOUNTER — Ambulatory Visit: Payer: BC Managed Care – PPO | Admitting: Physical Therapy

## 2019-01-08 ENCOUNTER — Ambulatory Visit: Payer: BC Managed Care – PPO | Admitting: Occupational Therapy

## 2019-01-08 DIAGNOSIS — M25551 Pain in right hip: Secondary | ICD-10-CM

## 2019-01-08 DIAGNOSIS — M6281 Muscle weakness (generalized): Secondary | ICD-10-CM

## 2019-01-08 DIAGNOSIS — M25571 Pain in right ankle and joints of right foot: Secondary | ICD-10-CM

## 2019-01-08 DIAGNOSIS — R278 Other lack of coordination: Secondary | ICD-10-CM

## 2019-01-08 DIAGNOSIS — R29898 Other symptoms and signs involving the musculoskeletal system: Secondary | ICD-10-CM

## 2019-01-08 DIAGNOSIS — R262 Difficulty in walking, not elsewhere classified: Secondary | ICD-10-CM

## 2019-01-08 NOTE — Therapy (Signed)
[place] MAIN [facility] SERVICES [address] [place], [place], [address] Phone: [phone]   Fax:  [phone]  Occupational Therapy Progress Note  Dates of reporting period  11/18/2018   to   01/08/2019  Patient Details  Name: [name] MRN: [id number] Date of Birth: September 29, 1965 Referring Provider (OT): Alma Friendly, MD   Encounter Date: 01/08/2019  OT End of Session - 01/08/19 1110    Visit Number  20    Number of Visits  24    Date for OT Re-Evaluation  03/05/19    Authorization Type  [employer] progress report period starting 11/18/18    Authorization Time Period  09/24/18-12/17/18    OT Start Time  1104    OT Stop Time  1145    OT Time Calculation (min)  41 min    Activity Tolerance  Patient tolerated treatment well    Behavior During Therapy  [facility] for tasks assessed/performed       Past Medical History:  Diagnosis Date  . Allergy   . Amyotrophic lateral sclerosis (ALS) ([place]) 09/23/2018  . Anemia   . Anxiety   . Arthritis   . Asthma   . Colon polyp   . Complication of anesthesia   . Diabetes mellitus   . Edema   . Fatigue   . Hypertension   . IBS (irritable bowel syndrome)   . Lump in female breast   . Methicillin resistant Staphylococcus aureus in conditions classified elsewhere and of unspecified site   . Migraines   . Morbid obesity ([place])   . Night sweats   . Nonspecific abnormal results of thyroid function study   . Other B-complex deficiencies   . Paresthesias   . PONV (postoperative nausea and vomiting)   . Pure hypercholesterolemia   . Reflux   . Sacral fracture ([place])   . Sinus problem   . Symptomatic states associated with artificial menopause   . Thyroid disease   . Type II or unspecified type diabetes mellitus without mention of complication, not stated as uncontrolled   . Unspecified sleep apnea     Past Surgical History:  Procedure Laterality Date  . abcess removal  1997   MRSA  . ABDOMINAL  HYSTERECTOMY  1997   complete in 1997, partial was in 1995  . carpal tunnel release r  07/03/13   [facility]al[place]orf  . [place]  . CHOLECYSTECTOMY  03/1989  . COLONOSCOPY  03/21/2011   Normal.  [facility].  [name] KNEE SURGERY  2006  . LAPAROSCOPIC GASTRIC BANDING  05/2009  . TONSILLECTOMY AND ADENOIDECTOMY  1974  . TUBAL LIGATION    . ULNAR TUNNEL RELEASE Right 02/05/2018   Procedure: CUBITAL TUNNEL RELEASE/DECOMPRESSION;  Surgeon: Melrose Nakayama, MD;  Location: [place];  Service: Orthopedics;  Laterality: Right;    There were no vitals filed for this visit.  Subjective Assessment - 01/08/19 1109    Subjective   Pt. reports that she is doing well today, and is sleeping well at night.    Patient is accompanied by:  Family member    Pertinent History  [name] was recently diagnosed with ALS at [facility].  She has had a recent decrease in strength in L side of body, UE and LE with decreased coordination.  She feels like she is dropping cups more and needs to use both hands for hand to mouth and handwriting has decreased to almost illegible.  Patient Stated Goals  I want to work on handwriting, being able to hold a cup again and regain strength and coordination in both hands.    Currently in Pain?  No/denies      OT TREATMENT    Neuro muscular re-education:  Pt. worked on using her bilateral hands for grasping, and flipping 2" large pegs on the Instructo board placed at a tabletop surface. Pt. worked on using her hand. for storing 2 pegs at a time, and translatory movements. Pt. Performed reps of flipping the pegs at a normal speed, followed by challenging at increased speed. Pt. With decreased motor control, and [facility] skills when challenged with increased speed.   Response to Treatment:  Pt. has made progress and has impeovied with using her hands to brush her teeth, handle utensils, shaving her legs, and opening bottles. Pt. continues to present with limited [facility] skills,  and drops utensils, and cups. Pt. continues to work on improving BUE strength, and [facility] skills in order to improve UE functioning during ADLs, and IADL tasks. Pt. continues to work on improving BUE functioning in order to improve UE functioning during ADLs, and IADLs.                           OT Education - 01/08/19 1110    Education Details  [facility]    Person(s) Educated  Patient    Methods  Explanation;Demonstration;Verbal cues    Comprehension  Verbalized understanding;Returned demonstration          OT Long Term Goals - 01/08/19 1100      OT LONG TERM GOAL #1   Title  Pt will complete self feeding using adaptive utensils and compenstory techniques using R hand.    Baseline  Pt needs help with holding cup, cutting meat and uses L hand as assist.    Time  12    Period  Weeks    Status  Achieved      OT LONG TERM GOAL #2   Title  Pt will complete LB dressing using adaptive equipment with min assist and cues in unsupported sitting.    Baseline  Now able to do her socks, pants but needs help with AFOs and shoes 12/11/18    Time  12    Period  Weeks    Status  On-going    Target Date  03/05/19      OT LONG TERM GOAL #3   Title  Pt will complete toileting and hygiene with adaptive aid with min assist and cues on elevated toilet with use of grab bar and FWW for balance.    Baseline  improving but still requires occasional assist    Time  12    Period  Weeks    Status  On-going    Target Date  03/05/19      OT LONG TERM GOAL #4   Title  Pt will be educated in HEP for strengthening and fine motor coordination.    Baseline  ongoing additions to home program    Time  12    Period  Weeks    Status  On-going    Target Date  03/05/19      OT LONG TERM GOAL #5   Title  Pt will increase strength  in B hands for use during ADLs by 3#    Baseline  exceeded initial goal, revised to 3 additional #    Time  12  Period  Weeks    Status  On-going    Target Date   03/05/19      OT LONG TERM GOAL #6   Title  Pt will increase strength 2# in B pinch for lateral and 3 point for use during ADLs.    Baseline  minimal changes in pinch 12/11/2018    Time  12    Period  Weeks    Status  Achieved      OT LONG TERM GOAL #7   Title  Patient will demonstrate improved fine motor coordination skills to minimize dropping items such as pills    Baseline  drops items frequently    Time  12    Period  Weeks    Status  New    Target Date  03/05/19      OT LONG TERM GOAL #8   Title  Patient to demonstrate ability to manipulate buttons with or without use of adaptive equipment.    Baseline  unable to perform buttons and requires assist.    Time  12    Period  Weeks    Status  New    Target Date  03/05/19            Plan - 01/08/19 1111    Clinical Impression Statement Pt. has made progress and has impeovied with using her hands to brush her teeth, handle utensils, shaving her legs, and opening bottles. Pt. continues to present with limited [facility] skills, and drops utensils, and cups. Pt. continues to work on improving BUE strength, and [facility] skills in order to improve UE functioning during ADLs, and IADL tasks. Pt. continues to work on improving BUE functioning in order to improve UE functioning during ADLs, and IADLs.    OT Occupational Profile and History  Detailed Assessment- Review of Records and additional review of physical, cognitive, psychosocial history related to current functional performance    Occupational performance deficits (Please refer to evaluation for details):  ADL's;IADL's;Education;Leisure    Body Structure / Function / Physical Skills  Strength;Gait;FMC;IADL;Balance;ADL;Coordination;Endurance;UE functional use;Mobility    Rehab Potential  Good    Clinical Decision Making  Several treatment options, min-mod task modification necessary    Comorbidities Affecting Occupational Performance:  Presence of comorbidities impacting occupational  performance    Comorbidities impacting occupational performance description:  progressive muscle weakness, balance, coordination    Modification or Assistance to Complete Evaluation   Min-Moderate modification of tasks or assist with assess necessary to complete eval    OT Frequency  2x / week    OT Duration  12 weeks    OT Treatment/Interventions  Moist Heat;Self-care/ADL training;Therapeutic exercise;Patient/family education;Energy conservation;Therapist, nutritional;Therapeutic activities;Balance training;DME and/or AE instruction;Manual Therapy;Psychosocial skills training    Plan  Rec continued OT 2x per week for 12 weeks    OT Home Exercise Plan  Pt given medium/red theraputty for home strengthening exercises    Consulted and Agree with Plan of Care  Patient       Patient will benefit from skilled therapeutic intervention in order to improve the following deficits and impairments:   Body Structure / Function / Physical Skills: Strength, Gait, FMC, IADL, Balance, ADL, Coordination, Endurance, UE functional use, Mobility       Visit Diagnosis: Muscle weakness (generalized)  Other lack of coordination    Problem List Patient Active Problem List   Diagnosis Date Noted  . Vitamin D deficiency 10/29/2018  . Migraine without status migrainosus, not intractable   . Chronic  pain syndrome   . Lumbar disc disease with radiculopathy 07/21/2017  . Lumbar foraminal stenosis 07/21/2017  . Unable to walk 07/21/2017  . Multiple falls 07/21/2017  . Displaced fracture of proximal end of right fibula 07/21/2017  . Lower extremity weakness 05/28/2017  . Right lower quadrant abdominal pain 08/22/2016  . Abnormal TSH 01/10/2016  . Morbid obesity ([place]) 01/12/2014  . Routine general medical examination at a health care facility 10/28/2012  . GERD (gastroesophageal reflux disease) 10/28/2012  . Anxiety 12/20/2011  . Allergic rhinitis 12/20/2011  . Asthma 12/20/2011  . Lapband APL with  HH repair 03/31/2011  . Uncontrolled type 2 diabetes mellitus with hyperglycemia, with long-term current use of insulin ([place]) 06/30/2007  . Hyperlipidemia 06/30/2007  . Essential hypertension 06/27/2007    Harrel Carina, MS, OTR/L 01/08/2019, 11:24 AM  [place] MAIN [facility] SERVICES [address] [place], [place], [address] Phone: [phone]   Fax:  [phone]  Name: [name] MRN: [id number] Date of Birth: 08-17-1965

## 2019-01-09 NOTE — Therapy (Signed)
[place] MAIN [facility] SERVICES [address] [place], [place], [address] Phone: [phone]   Fax:  [phone]  Physical Therapy Treatment  Patient Details  Name: [name] MRN: [id number] Date of Birth: March 24, 1965 Referring Provider (PT): Cornell Barman   Encounter Date: 01/08/2019  PT End of Session - 01/09/19 1409    Visit Number  35    Date for PT Re-Evaluation  12/31/18    PT Start Time  6962    PT Stop Time  1225    PT Time Calculation (min)  40 min    Behavior During Therapy  [facility] for tasks assessed/performed       Past Medical History:  Diagnosis Date  . Allergy   . Amyotrophic lateral sclerosis (ALS) ([place]) 09/23/2018  . Anemia   . Anxiety   . Arthritis   . Asthma   . Colon polyp   . Complication of anesthesia   . Diabetes mellitus   . Edema   . Fatigue   . Hypertension   . IBS (irritable bowel syndrome)   . Lump in female breast   . Methicillin resistant Staphylococcus aureus in conditions classified elsewhere and of unspecified site   . Migraines   . Morbid obesity ([place])   . Night sweats   . Nonspecific abnormal results of thyroid function study   . Other B-complex deficiencies   . Paresthesias   . PONV (postoperative nausea and vomiting)   . Pure hypercholesterolemia   . Reflux   . Sacral fracture ([place])   . Sinus problem   . Symptomatic states associated with artificial menopause   . Thyroid disease   . Type II or unspecified type diabetes mellitus without mention of complication, not stated as uncontrolled   . Unspecified sleep apnea     Past Surgical History:  Procedure Laterality Date  . abcess removal  1997   MRSA  . ABDOMINAL HYSTERECTOMY  1997   complete in 1997, partial was in 1995  . carpal tunnel release r  07/03/13   Dalldorf  . [place]  . CHOLECYSTECTOMY  03/1989  . COLONOSCOPY  03/21/2011   Normal.  [facility].  [name] KNEE SURGERY  2006  . LAPAROSCOPIC GASTRIC  BANDING  05/2009  . TONSILLECTOMY AND ADENOIDECTOMY  1974  . TUBAL LIGATION    . ULNAR TUNNEL RELEASE Right 02/05/2018   Procedure: CUBITAL TUNNEL RELEASE/DECOMPRESSION;  Surgeon: Melrose Nakayama, MD;  Location: [place];  Service: Orthopedics;  Laterality: Right;    There were no vitals filed for this visit.    Therapeutic exercise: Nu-step  x 5 mins L 5  Supine: SLR x 15 BLE Hookling marching x 15 , 4 lbs x 3 sets Hooklying abd/ER x 15  Bridging x 15 SAQ x 15 BLE, 4 lbs x 3 sets Hip abd/add x 15, BLE Heel slides x 15, BLE  Sidelying: Hip abd x 15 , BLE   Pt educated throughout session about proper posture and technique with exercises. Improved exercise technique, movement at target joints, use of target muscles after min to mod verbal, visual, tactile cues.                           PT Short Term Goals - 10/23/18 1159      PT SHORT TERM GOAL #1   Title  Patient will be independent in home exercise program to improve strength/mobility for better  functional independence with ADLs.    Time  6    Period  Weeks    Status  Partially Met    Target Date  09/10/18        PT Long Term Goals - 11/04/18 1513      PT LONG TERM GOAL #1   Title  Patient will increase 10 meter walk test to >.50 m/s as to improve gait speed for better community ambulation and to reduce fall risk.    Baseline  . 36 m/sec,, 10/23/18 =.43 m/ sec    Time  8    Period  Weeks    Status  Partially Met    Target Date  12/31/18      PT LONG TERM GOAL #2   Title  Patient will transfer sit to stand  from 19 inch mat without assist or anyone supporting AD  to demonstrate improved LE strength to decrease falls risk.     Time  12    Period  Weeks    Status  Achieved      PT LONG TERM GOAL #3   Title  Patient will demonstrate stance without AD  x 1 min to demonstrate decreased falls risk.     Baseline  10/23/18 =    Time  12    Period  Weeks    Status  Achieved    Target Date  12/31/18       PT LONG TERM GOAL #4   Title  Patient will  ambulate 500 feet to work towards community ambulation distances.     Baseline  270 feet, 10/23/18= 305 feet    Time  12    Period  Weeks    Status  Partially Met    Target Date  12/31/18      PT LONG TERM GOAL #5   Title  Patient (< 54 years old) will complete five times sit to stand test in < 10 seconds indicating an increased LE strength and improved balance.    Baseline  20.28 m/sec. 10/23/18 =16.82 ( from green chair)    Time  12    Period  Weeks    Status  Partially Met    Target Date  12/31/18      Additional Long Term Goals   Additional Long Term Goals  Yes      PT LONG TERM GOAL #6   Title  Patient will report a worst pain of 3/10 on VAS in left knee             to improve tolerance with ADLs and reduced symptoms with activities.    Time  12    Period  Weeks    Status  New    Target Date  12/31/18            Plan - 01/09/19 1409    Clinical Impression Statement Patient instructed in advanced strengthening in BLE. Patient responded well to cues demonstrating better posture and body mechanics with advanced exercise and seems to be progressing well with less pain this session. Patient does still have some left knee pain with prolonged use. Patient would benefit from additional skilled PT Intervention to reduce pain and improve strength for return to PLOF.   Rehab Potential  Good    PT Frequency  2x / week    PT Duration  12 weeks    PT Treatment/Interventions  Gait training;Therapeutic activities;Therapeutic exercise;Balance training;Neuromuscular re-education;Patient/family education;Orthotic Fit/Training;Manual techniques    PT Next  Visit Plan  Continue with current program.    PT Home Exercise Plan  No updates this date     Consulted and Agree with Plan of Care  Patient    Family Member Consulted  Spouse        Patient will benefit from skilled therapeutic intervention in order to improve the following deficits and  impairments:  Decreased balance, Decreased endurance, Decreased mobility, Difficulty walking, Impaired sensation, Decreased range of motion, Decreased activity tolerance, Decreased coordination, Decreased strength, Pain, Postural dysfunction  Visit Diagnosis: Other lack of coordination  Muscle weakness (generalized)  Pain in right ankle and joints of right foot  Difficulty in walking, not elsewhere classified  Weakness of right leg  Pain in right hip     Problem List Patient Active Problem List   Diagnosis Date Noted  . Vitamin D deficiency 10/29/2018  . Migraine without status migrainosus, not intractable   . Chronic pain syndrome   . Lumbar disc disease with radiculopathy 07/21/2017  . Lumbar foraminal stenosis 07/21/2017  . Unable to walk 07/21/2017  . Multiple falls 07/21/2017  . Displaced fracture of proximal end of right fibula 07/21/2017  . Lower extremity weakness 05/28/2017  . Right lower quadrant abdominal pain 08/22/2016  . Abnormal TSH 01/10/2016  . Morbid obesity ([place]) 01/12/2014  . Routine general medical examination at a health care facility 10/28/2012  . GERD (gastroesophageal reflux disease) 10/28/2012  . Anxiety 12/20/2011  . Allergic rhinitis 12/20/2011  . Asthma 12/20/2011  . Lapband APL with HH repair 03/31/2011  . Uncontrolled type 2 diabetes mellitus with hyperglycemia, with long-term current use of insulin ([place]) 06/30/2007  . Hyperlipidemia 06/30/2007  . Essential hypertension 06/27/2007    Alanson Puls, PT DPT 01/09/2019, 2:11 PM  [place] MAIN [facility] SERVICES [address] [place], [place], [address] Phone: [phone]   Fax:  [phone]  Name: SHUNDA RABADI MRN: [id number] Date of Birth: 05-12-1965

## 2019-01-11 ENCOUNTER — Other Ambulatory Visit: Payer: Self-pay | Admitting: Primary Care

## 2019-01-11 DIAGNOSIS — E559 Vitamin D deficiency, unspecified: Secondary | ICD-10-CM

## 2019-01-11 DIAGNOSIS — E119 Type 2 diabetes mellitus without complications: Secondary | ICD-10-CM

## 2019-01-11 DIAGNOSIS — Z794 Long term (current) use of insulin: Secondary | ICD-10-CM

## 2019-01-13 ENCOUNTER — Encounter: Payer: Self-pay | Admitting: Occupational Therapy

## 2019-01-13 ENCOUNTER — Ambulatory Visit: Payer: BC Managed Care – PPO | Admitting: Physical Therapy

## 2019-01-13 ENCOUNTER — Encounter: Payer: Self-pay | Admitting: Physical Therapy

## 2019-01-13 ENCOUNTER — Other Ambulatory Visit: Payer: Self-pay

## 2019-01-13 ENCOUNTER — Ambulatory Visit: Payer: BC Managed Care – PPO | Admitting: Occupational Therapy

## 2019-01-13 DIAGNOSIS — M25571 Pain in right ankle and joints of right foot: Secondary | ICD-10-CM

## 2019-01-13 DIAGNOSIS — R29898 Other symptoms and signs involving the musculoskeletal system: Secondary | ICD-10-CM

## 2019-01-13 DIAGNOSIS — M6281 Muscle weakness (generalized): Secondary | ICD-10-CM

## 2019-01-13 DIAGNOSIS — R278 Other lack of coordination: Secondary | ICD-10-CM

## 2019-01-13 DIAGNOSIS — M25551 Pain in right hip: Secondary | ICD-10-CM

## 2019-01-13 DIAGNOSIS — R262 Difficulty in walking, not elsewhere classified: Secondary | ICD-10-CM

## 2019-01-13 NOTE — Telephone Encounter (Signed)
Please call patient to schedule a follow up visit for repeat labs and diabetes check. i've mentioned this to her several times via my chart and she has yet to schedule. Let me know when this has been done, she is due on or after November 11th.

## 2019-01-13 NOTE — Therapy (Signed)
[place] [facility] REH[facility] SERVICES [address] [place], [place], [address] Phone: [phone]   Fax:  [phone]  Physical Therapy Treatment  Patient Details  Name: [name] MRN: [id number] Date of Birth: 01/21/1966 Referring Provider (PT): clark Kathrine   Encounter Date: 01/13/2019  PT End of Session - 01/13/19 1153    Visit Number  36    Date for PT Re-Evaluation  12/31/18    PT Start Time  1145    PT Stop Time  1225    PT Time Calculation (min)  40 min    Behavior During Therapy  WFL for tasks assessed/performed       Past Medical History:  Diagnosis Date  . Allergy   . 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  . Anemia   . Anxiety   . Arthritis   . Asthma   . Colon polyp   . Complication of anesthesia   . Diabetes mellitus   . Edema   . Fatigue   . Hypertension   . IBS (irritable bowel syndrome)   . Lump in female breast   . Methicillin resistant Staphylococcus aureus in conditions classified elsewhere and of unspecified site   . Migraines   . Morbid obesity ([facility])   . Night sweats   . Nonspecific abnormal results of thyroid function study   . Other B-complex deficiencies   . Paresthesias   . PONV (postoperative nausea and vomiting)   . Pure hypercholesterolemia   . Reflux   . Sacral fracture ([facility])   . Sinus problem   . Symptomatic states associated with artificial menopause   . Thyroid disease   . Type II or unspecified type diabetes mellitus without mention of complication, not stated as uncontrolled   . Unspecified sleep apnea     Past Surgical History:  Procedure Laterality Date  . abcess removal  1997   MRSA  . ABDOMINAL HYSTERECTOMY  1997   complete in 1997, partial was in 1995  . carpal tunnel release r  07/03/13   Dalldorf  . CESAREAN SECTION  1987  and 1989  . CHOLECYSTECTOMY  03/1989  . COLONOSCOPY  03/21/2011   Normal.  [facility].  . KNEE SURGERY  2006  . LAPAROSCOPIC GASTRIC  BANDING  05/2009  . TONSILLECTOMY AND ADENOIDECTOMY  1974  . TUBAL LIGATION    . ULNAR TUNNEL RELEASE Right 02/05/2018   Procedure: CUBITAL TUNNEL RELEASE/DECOMPRESSION;  Surgeon: Dalldorf, Peter, MD;  Location: [facility];  Service: Orthopedics;  Laterality: Right;    There were no vitals filed for this visit.  Subjective Assessment - 01/13/19 1528    Subjective  Pt reports feelign good today.. No pain to report this date.    Patient is accompained by:  Family member    Pertinent History   Patient fx her sacrum 9/18 and  she lost sensation to right foot, and she lost ability to DF her right foot. Patient had back surgery 03/2017 , she was sent home. She was walking with no device indoors and she could get out of the house and do the steps. June 18, 2017  her LLE was getting weaker and the right leg was getting stronger. Patient had follow up with neurosurgery. She was scheduled for a steriod shot Jul 23, 2017, but on May 3 her legs didnt  feel right, she fell 4 times, she fractured her R fibula  on may 3rd, she went to hospital and spent one week. She went   to SNF for 1 month. She had another neuro consult, resulting in kDx of  polyneuropathy. Then she had an apt with and [facility] said it was not polyneuropathy, (in Octber)  She has a second consultation scheduled Dec 30 for a  Neuromuscular.consult.  She had a scan of the spine T 12-28-17  who then sent her to a thoracic surgeon at the end of November and was recommended to not due surgery. She does not know what her Dx is and she has been having home PT beginning August 25, 2017 . She has had HHPT 2 x  week for the last 5 months. She is able to walk 50 feet with RW. She is able to transfer from wc to stand independently.     How long can you stand comfortably?  5 mins    Patient Stated Goals  to walk without the RW, or walk better, use the bathroom    Pain Onset  In the past 7 days       Therapeutic exercise:  Nu-step  x 5 mins L 4   Standing with toe taps  to 6 inch stool x 5 x 2 sets , 15 reps x 1  Standing with RW and and sorting balls left and right with min assist and one UE assist    Verbal cues needed for sequencing and foot placement; able to demonstrate improved dynamic balance control, continues to have decreased step length when stepping.                           PT Education - 01/13/19 1153    Education provided  Yes    Education Details  HEP    Person(s) Educated  Patient    Methods  Explanation    Comprehension  Verbalized understanding;Returned demonstration;Need further instruction       PT Short Term Goals - 10/23/18 1159      PT SHORT TERM GOAL #1   Title  Patient will be independent in home exercise program to improve strength/mobility for better functional independence with ADLs.    Time  6    Period  Weeks    Status  Partially Met    Target Date  09/10/18        PT Long Term Goals - 11/04/18 1513      PT LONG TERM GOAL #1   Title  Patient will increase 10 meter walk test to >.50 m/s as to improve gait speed for better community ambulation and to reduce fall risk.    Baseline  . 36 m/sec,, 10/23/18 =.43 m/ sec    Time  8    Period  Weeks    Status  Partially Met    Target Date  12/31/18      PT LONG TERM GOAL #2   Title  Patient will transfer sit to stand  from 19 inch mat without assist or anyone supporting AD  to demonstrate improved LE strength to decrease falls risk.     Time  12    Period  Weeks    Status  Achieved      PT LONG TERM GOAL #3   Title  Patient will demonstrate stance without AD  x 1 min to demonstrate decreased falls risk.     Baseline  10/23/18 =    Time  12    Period  Weeks    Status  Achieved    Target Date  12/31/18        PT LONG TERM GOAL #4   Title  Patient will  ambulate 500 feet to work towards community ambulation distances.     Baseline  270 feet, 10/23/18= 305 feet    Time  12    Period  Weeks    Status  Partially Met    Target Date  12/31/18       PT LONG TERM GOAL #5   Title  Patient (53 years old) will complete five times sit to stand test in < 10 seconds indicating an increased LE strength and improved balance.    Baseline  20.28 m/sec. 10/23/18 =16.82 ( from green chair)    Time  12    Period  Weeks    Status  Partially Met    Target Date  12/31/18      Additional Long Term Goals   Additional Long Term Goals  Yes      PT LONG TERM GOAL #6   Title  Patient will report a worst pain of 3/10 on VAS in left knee             to improve tolerance with ADLs and reduced symptoms with activities.    Time  12    Period  Weeks    Status  New    Target Date  12/31/18            Plan - 01/13/19 1154    Clinical Impression Statement  p    Rehab Potential  Good    PT Frequency  2x / week    PT Duration  12 weeks    PT Treatment/Interventions  Gait training;Therapeutic activities;Therapeutic exercise;Balance training;Neuromuscular re-education;Patient/family education;Orthotic Fit/Training;Manual techniques    PT Next Visit Plan  Continue with current program.    PT Home Exercise Plan  No updates this date     Consulted and Agree with Plan of Care  Patient    Family Member Consulted  Spouse        Patient will benefit from skilled therapeutic intervention in order to improve the following deficits and impairments:  Decreased balance, Decreased endurance, Decreased mobility, Difficulty walking, Impaired sensation, Decreased range of motion, Decreased activity tolerance, Decreased coordination, Decreased strength, Pain, Postural dysfunction  Visit Diagnosis: Other lack of coordination  Muscle weakness (generalized)  Pain in right ankle and joints of right foot  Difficulty in walking, not elsewhere classified  Weakness of right leg  Pain in right hip     Problem List Patient Active Problem List   Diagnosis Date Noted  . Vitamin D deficiency 10/29/2018  . Migraine without status migrainosus, not intractable    . Chronic pain syndrome   . Lumbar disc disease with radiculopathy 07/21/2017  . Lumbar foraminal stenosis 07/21/2017  . Unable to walk 07/21/2017  . Multiple falls 07/21/2017  . Displaced fracture of proximal end of right fibula 07/21/2017  . Lower extremity weakness 05/28/2017  . Right lower quadrant abdominal pain 08/22/2016  . Abnormal TSH 01/10/2016  . Morbid obesity ([facility]) 01/12/2014  . Routine general medical examination at a health care facility 10/28/2012  . GERD (gastroesophageal reflux disease) 10/28/2012  . Anxiety 12/20/2011  . Allergic rhinitis 12/20/2011  . Asthma 12/20/2011  . Lapband APL with HH repair 03/31/2011  . Uncontrolled type 2 diabetes mellitus with hyperglycemia, with long-term current use of insulin ([facility]) 06/30/2007  . Hyperlipidemia 06/30/2007  . Essential hypertension 06/27/2007    Mansfield, Kristine S, PT DPT 01/13/2019, 3:39 PM  [place]   [place] MAIN [facility] SERVICES [place], [place], [address] Phone: [phone]   Fax:  [phone]  Name: LAJEAN BOESE MRN: [id number] Date of Birth: 01-Mar-1966

## 2019-01-13 NOTE — Telephone Encounter (Signed)
Vitamin D Last prescribed on 10/29/2018 .   Insulin Detemir (LEVEMIR FLEXTOUCH) 100 UNIT/ML Pen  Last prescribed on 07/16/2018  Last appointment on 10/29/2018. No future appointment

## 2019-01-13 NOTE — Therapy (Signed)
[place] MAIN [facility] SERVICES [address] [place], [place], [address] Phone: [phone]   Fax:  [phone]  Occupational Therapy Treatment  Patient Details  Name: [name] MRN: [id number] Date of Birth: 1965-05-17 Referring Provider (OT): Alma Friendly, MD   Encounter Date: 01/13/2019  OT End of Session - 01/13/19 1131    Visit Number  21    Number of Visits  24    Date for OT Re-Evaluation  03/05/19    OT Start Time  1105    OT Stop Time  1145    OT Time Calculation (min)  40 min    Activity Tolerance  Patient tolerated treatment well    Behavior During Therapy  [facility] for tasks assessed/performed       Past Medical History:  Diagnosis Date  . Allergy   . Amyotrophic lateral sclerosis (ALS) ([place]) 09/23/2018  . Anemia   . Anxiety   . Arthritis   . Asthma   . Colon polyp   . Complication of anesthesia   . Diabetes mellitus   . Edema   . Fatigue   . Hypertension   . IBS (irritable bowel syndrome)   . Lump in female breast   . Methicillin resistant Staphylococcus aureus in conditions classified elsewhere and of unspecified site   . Migraines   . Morbid obesity ([place])   . Night sweats   . Nonspecific abnormal results of thyroid function study   . Other B-complex deficiencies   . Paresthesias   . PONV (postoperative nausea and vomiting)   . Pure hypercholesterolemia   . Reflux   . Sacral fracture ([place])   . Sinus problem   . Symptomatic states associated with artificial menopause   . Thyroid disease   . Type II or unspecified type diabetes mellitus without mention of complication, not stated as uncontrolled   . Unspecified sleep apnea     Past Surgical History:  Procedure Laterality Date  . abcess removal  1997   MRSA  . ABDOMINAL HYSTERECTOMY  1997   complete in 1997, partial was in 1995  . carpal tunnel release r  07/03/13   Dalldorf  . [place]  . CHOLECYSTECTOMY  03/1989  .  COLONOSCOPY  03/21/2011   Normal.  [facility].  [name] KNEE SURGERY  2006  . LAPAROSCOPIC GASTRIC BANDING  05/2009  . TONSILLECTOMY AND ADENOIDECTOMY  1974  . TUBAL LIGATION    . ULNAR TUNNEL RELEASE Right 02/05/2018   Procedure: CUBITAL TUNNEL RELEASE/DECOMPRESSION;  Surgeon: Melrose Nakayama, MD;  Location: [facility];  Service: Orthopedics;  Laterality: Right;    There were no vitals filed for this visit.  Subjective Assessment - 01/13/19 1129    Subjective   Pt. reports that she is doing well today, and is sleeping well at night.    Patient is accompanied by:  Family member    Pertinent History  [name] was recently diagnosed with ALS at [facility].  She has had a recent decrease in strength in L side of body, UE and LE with decreased coordination.  She feels like she is dropping cups more and needs to use both hands for hand to mouth and handwriting has decreased to almost illegible.    Currently in Pain?  No/denies      OT TREATMENT    Neuro muscular re-education:  Pt. worked on bilateral hand coordination skills manipulating 1/4 small beads and pushing them through  a resistive board following a Barrister's clerk. Pt. required increased time, and repositioned the board in her hands when pushing them through. Pt. occasionally dropped beads when attempting to push them the resistive board.    Response to Treatment:  Pt. continues to make steady porgress, and has used her hands more during ADL, and IADL tasks for activities including: brushing her teeth, handling utensils, shaving her legs, and opening bottles. Pt. continues to work on improving BUE functioning in preparation for improving funcational hand use during ADLs, and IADL tasks.                        OT Education - 01/13/19 1130    Education Details  [facility]    Person(s) Educated  Patient          OT Long Term Goals - 01/08/19 1100      OT LONG TERM GOAL #1   Title  Pt will complete self feeding using  adaptive utensils and compenstory techniques using R hand.    Baseline  Pt needs help with holding cup, cutting meat and uses L hand as assist.    Time  12    Period  Weeks    Status  Achieved      OT LONG TERM GOAL #2   Title  Pt will complete LB dressing using adaptive equipment with min assist and cues in unsupported sitting.    Baseline  Now able to do her socks, pants but needs help with AFOs and shoes 12/11/18    Time  12    Period  Weeks    Status  On-going    Target Date  03/05/19      OT LONG TERM GOAL #3   Title  Pt will complete toileting and hygiene with adaptive aid with min assist and cues on elevated toilet with use of grab bar and FWW for balance.    Baseline  improving but still requires occasional assist    Time  12    Period  Weeks    Status  On-going    Target Date  03/05/19      OT LONG TERM GOAL #4   Title  Pt will be educated in HEP for strengthening and fine motor coordination.    Baseline  ongoing additions to home program    Time  12    Period  Weeks    Status  On-going    Target Date  03/05/19      OT LONG TERM GOAL #5   Title  Pt will increase strength  in B hands for use during ADLs by 3#    Baseline  exceeded initial goal, revised to 3 additional #    Time  12    Period  Weeks    Status  On-going    Target Date  03/05/19      OT LONG TERM GOAL #6   Title  Pt will increase strength 2# in B pinch for lateral and 3 point for use during ADLs.    Baseline  minimal changes in pinch 12/11/2018    Time  12    Period  Weeks    Status  Achieved      OT LONG TERM GOAL #7   Title  Patient will demonstrate improved fine motor coordination skills to minimize dropping items such as pills    Baseline  drops items frequently    Time  12    Period  Weeks    Status  New    Target Date  03/05/19      OT LONG TERM GOAL #8   Title  Patient to demonstrate ability to manipulate buttons with or without use of adaptive equipment.    Baseline  unable to perform  buttons and requires assist.    Time  12    Period  Weeks    Status  New    Target Date  03/05/19            Plan - 01/13/19 1131    Clinical Impression Statement Pt. continues to make steady porgress, and has used her hands more during ADL, and IADL tasks for activities including: brushing her teeth, handling utensils, shaving her legs, and opening bottles. Pt. continues to work on improving BUE functioning in preparation for improving funcational hand  use during ADLs, and IADL tasks.     OT Occupational Profile and History  Detailed Assessment- Review of Records and additional review of physical, cognitive, psychosocial history related to current functional performance    Occupational performance deficits (Please refer to evaluation for details):  ADL's;IADL's;Education;Leisure    Body Structure / Function / Physical Skills  Strength;Gait;FMC;IADL;Balance;ADL;Coordination;Endurance;UE functional use;Mobility    Rehab Potential  Good    Clinical Decision Making  Several treatment options, min-mod task modification necessary    Comorbidities Affecting Occupational Performance:  Presence of comorbidities impacting occupational performance    Comorbidities impacting occupational performance description:  progressive muscle weakness, balance, coordination    Modification or Assistance to Complete Evaluation   Min-Moderate modification of tasks or assist with assess necessary to complete eval    OT Frequency  2x / week    OT Duration  12 weeks    OT Treatment/Interventions  Moist Heat;Self-care/ADL training;Therapeutic exercise;Patient/family education;Energy conservation;Therapist, nutritional;Therapeutic activities;Balance training;DME and/or AE instruction;Manual Therapy;Psychosocial skills training    Consulted and Agree with Plan of Care  Patient       Patient will benefit from skilled therapeutic intervention in order to improve the following deficits and impairments:   Body  Structure / Function / Physical Skills: Strength, Gait, FMC, IADL, Balance, ADL, Coordination, Endurance, UE functional use, Mobility       Visit Diagnosis: Muscle weakness (generalized)  Other lack of coordination    Problem List Patient Active Problem List   Diagnosis Date Noted  . Vitamin D deficiency 10/29/2018  . Migraine without status migrainosus, not intractable   . Chronic pain syndrome   . Lumbar disc disease with radiculopathy 07/21/2017  . Lumbar foraminal stenosis 07/21/2017  . Unable to walk 07/21/2017  . Multiple falls 07/21/2017  . Displaced fracture of proximal end of right fibula 07/21/2017  . Lower extremity weakness 05/28/2017  . Right lower quadrant abdominal pain 08/22/2016  . Abnormal TSH 01/10/2016  . Morbid obesity ([place]) 01/12/2014  . Routine general medical examination at a health care facility 10/28/2012  . GERD (gastroesophageal reflux disease) 10/28/2012  . Anxiety 12/20/2011  . Allergic rhinitis 12/20/2011  . Asthma 12/20/2011  . Lapband APL with HH repair 03/31/2011  . Uncontrolled type 2 diabetes mellitus with hyperglycemia, with long-term current use of insulin ([place]) 06/30/2007  . Hyperlipidemia 06/30/2007  . Essential hypertension 06/27/2007    Harrel Carina, MS, OTR/L 01/13/2019, 11:57 AM  [place] MAIN [facility] SERVICES [address] [place], [place], [address] Phone: [phone]   Fax:  [phone]  Name: YUNIQUE COULSON MRN: [id number] Date of Birth: 12/31/1965

## 2019-01-15 ENCOUNTER — Ambulatory Visit: Payer: BC Managed Care – PPO | Admitting: Occupational Therapy

## 2019-01-15 ENCOUNTER — Ambulatory Visit: Payer: BC Managed Care – PPO | Admitting: Physical Therapy

## 2019-01-15 NOTE — Telephone Encounter (Signed)
Thought that I have already sent this to you.  Patient is scheduled for a follow up on 01/30/2019

## 2019-01-15 NOTE — Telephone Encounter (Signed)
Noted, refills sent to pharmacy. 

## 2019-01-16 ENCOUNTER — Ambulatory Visit (INDEPENDENT_AMBULATORY_CARE_PROVIDER_SITE_OTHER): Payer: BC Managed Care – PPO

## 2019-01-16 DIAGNOSIS — Z23 Encounter for immunization: Secondary | ICD-10-CM | POA: Diagnosis not present

## 2019-01-18 ENCOUNTER — Other Ambulatory Visit: Payer: Self-pay | Admitting: Primary Care

## 2019-01-18 DIAGNOSIS — M5116 Intervertebral disc disorders with radiculopathy, lumbar region: Secondary | ICD-10-CM

## 2019-01-20 LAB — HM DIABETES EYE EXAM

## 2019-01-22 ENCOUNTER — Ambulatory Visit: Payer: BC Managed Care – PPO | Admitting: Physical Therapy

## 2019-01-22 ENCOUNTER — Encounter: Payer: Self-pay | Admitting: Physical Therapy

## 2019-01-22 ENCOUNTER — Ambulatory Visit: Payer: BC Managed Care – PPO | Attending: Primary Care | Admitting: Occupational Therapy

## 2019-01-22 ENCOUNTER — Encounter: Payer: Self-pay | Admitting: Primary Care

## 2019-01-22 ENCOUNTER — Other Ambulatory Visit: Payer: Self-pay

## 2019-01-22 ENCOUNTER — Encounter: Payer: Self-pay | Admitting: Occupational Therapy

## 2019-01-22 DIAGNOSIS — R29898 Other symptoms and signs involving the musculoskeletal system: Secondary | ICD-10-CM

## 2019-01-22 DIAGNOSIS — R278 Other lack of coordination: Secondary | ICD-10-CM

## 2019-01-22 DIAGNOSIS — R262 Difficulty in walking, not elsewhere classified: Secondary | ICD-10-CM | POA: Insufficient documentation

## 2019-01-22 DIAGNOSIS — M25571 Pain in right ankle and joints of right foot: Secondary | ICD-10-CM

## 2019-01-22 DIAGNOSIS — M25551 Pain in right hip: Secondary | ICD-10-CM | POA: Diagnosis present

## 2019-01-22 DIAGNOSIS — M6281 Muscle weakness (generalized): Secondary | ICD-10-CM

## 2019-01-22 NOTE — Therapy (Addendum)
[place] MAIN [facility] SERVICES [address] [place], [place], [address] Phone: [phone]   Fax:  [phone]  Physical Therapy Treatment  Patient Details  Name: [name] MRN: [id number] Date of Birth: [birth year] Referring Provider (PT): Cornell Barman   Encounter Date: 01/22/2019  PT End of Session - 01/22/19 1126    Visit Number  37    Date for PT Re-Evaluation  03/25/19    PT Start Time  1145    PT Stop Time  1225    PT Time Calculation (min)  40 min    Equipment Utilized During Treatment  Gait belt    Activity Tolerance  Patient tolerated treatment well    Behavior During Therapy  WFL for tasks assessed/performed       Past Medical History:  Diagnosis Date  . Allergy   . Amyotrophic lateral sclerosis (ALS) ([place]) 09/23/2018  . Anemia   . Anxiety   . Arthritis   . Asthma   . Colon polyp   . Complication of anesthesia   . Diabetes mellitus   . Edema   . Fatigue   . Hypertension   . IBS (irritable bowel syndrome)   . Lump in female breast   . Methicillin resistant Staphylococcus aureus in conditions classified elsewhere and of unspecified site   . Migraines   . Morbid obesity ([place])   . Night sweats   . Nonspecific abnormal results of thyroid function study   . Other B-complex deficiencies   . Paresthesias   . PONV (postoperative nausea and vomiting)   . Pure hypercholesterolemia   . Reflux   . Sacral fracture ([place])   . Sinus problem   . Symptomatic states associated with artificial menopause   . Thyroid disease   . Type II or unspecified type diabetes mellitus without mention of complication, not stated as uncontrolled   . Unspecified sleep apnea     Past Surgical History:  Procedure Laterality Date  . abcess removal  1997   MRSA  . ABDOMINAL HYSTERECTOMY  1997   complete in 1997, partial was in 1995  . carpal tunnel release r  07/03/13   Dalldorf  . [place]  . CHOLECYSTECTOMY   03/1989  . COLONOSCOPY  03/21/2011   Normal.  [facility].  [name] KNEE SURGERY  2006  . LAPAROSCOPIC GASTRIC BANDING  05/2009  . TONSILLECTOMY AND ADENOIDECTOMY  1974  . TUBAL LIGATION    . ULNAR TUNNEL RELEASE Right 02/05/2018   Procedure: CUBITAL TUNNEL RELEASE/DECOMPRESSION;  Surgeon: Melrose Nakayama, MD;  Location: [facility];  Service: Orthopedics;  Laterality: Right;    There were no vitals filed for this visit.  Subjective Assessment - 01/22/19 1419    Subjective  Pt reports feeling good today.[name] No pain to report this date.She is going to the beach after her PT treatment.    Patient is accompained by:  Family member    Pertinent History   Patient fx her sacrum 9/18 and  she lost sensation to right foot, and she lost ability to DF her right foot. Patient had back surgery 03/2017 , she was sent home. She was walking with no device indoors and she could get out of the house and do the steps. June 18, 2017  her LLE was getting weaker and the right leg was getting stronger. Patient had follow up with neurosurgery. She was scheduled for a steriod shot Jul 23, 2017, but on May 3  her legs didnt  feel right, she fell 4 times, she fractured her R fibula  on may 3rd, she went to hospital and spent one week. She went to SNF for 1 month. She had another neuro consult, resulting in kDx of  polyneuropathy. Then she had an apt with and [facility] said it was not polyneuropathy, (in [place])  She has a second consultation scheduled Dec 30 for a  Neuromuscular.consult.  She had a scan of the spine T 12-28-17  who then sent her to a thoracic surgeon at the end of November and was recommended to not due surgery. She does not know what her Dx is and she has been having home PT beginning August 25, 2017 . She has had HHPT 2 x  week for the last 5 months. She is able to walk 50 feet with RW. She is able to transfer from wc to stand independently.     How long can you stand comfortably?  5 mins    Patient Stated Goals  to walk without the  RW, or walk better, use the bathroom    Currently in Pain?  No/denies    Pain Score  0-No pain    Pain Onset  In the past 7 days       Treatment Nu-step x 5 mins L 5   Gait with Rw in the gym x 30 feet x 3 with sBA  Standing with sorting balls in front and single hand with opposite hand holding rolling table for minimal support sorting 4 balls , 4 levels and then changing sides  Followed by a seated rest  Standing and rotating trunk and getting a small foam ball and tossing it into a crate 10 feet away   Standing and hip abd x 15 x 2 BLE Standing and marching x 20 x 2 :   Pt educated throughout session about proper posture and technique with exercises. Improved exercise technique, movement at target joints, use of target muscles after min to mod verbal, visual, tactile cues.                      PT Education - 01/22/19 1126    Education provided  Yes    Education Details  HEP, safety    Person(s) Educated  Patient    Methods  Explanation;Demonstration    Comprehension  Verbalized understanding;Need further instruction       PT Short Term Goals - 10/23/18 1159      PT SHORT TERM GOAL #1   Title  Patient will be independent in home exercise program to improve strength/mobility for better functional independence with ADLs.    Time  6    Period  Weeks    Status  Partially Met    Target Date  09/10/18        PT Long Term Goals - 01/22/19 1129      PT LONG TERM GOAL #1   Title  Patient will increase 10 meter walk test to >.50 m/s as to improve gait speed for better community ambulation and to reduce fall risk.    Baseline  . 36 m/sec,, 10/23/18 =.43 m/ sec    Time  8    Period  Weeks    Status  Partially Met    Target Date  03/25/19      PT LONG TERM GOAL #2   Title  Patient will transfer sit to stand  from 19 inch mat without assist or  anyone supporting AD  to demonstrate improved LE strength to decrease falls risk.     Time  12    Period  Weeks     Status  Achieved    Target Date  03/25/19      PT LONG TERM GOAL #3   Title  Patient will demonstrate stance without AD  x 1 min to demonstrate decreased falls risk.     Baseline  10/23/18 =    Time  12    Period  Weeks    Status  Achieved    Target Date  03/25/19      PT LONG TERM GOAL #4   Title  Patient will  ambulate 500 feet to work towards community ambulation distances.     Baseline  270 feet, 10/23/18= 305 feet    Time  12    Period  Weeks    Status  Partially Met    Target Date  03/25/19      PT LONG TERM GOAL #5   Title  Patient (< 69 years old) will complete five times sit to stand test in < 10 seconds indicating an increased LE strength and improved balance.    Baseline  20.28 m/sec. 10/23/18 =16.82 ( from green chair)    Time  12    Period  Weeks    Status  Partially Met    Target Date  03/25/19      PT LONG TERM GOAL #6   Title  Patient will report a worst pain of 3/10 on VAS in left knee             to improve tolerance with ADLs and reduced symptoms with activities.    Time  12    Period  Weeks    Status  Partially Met    Target Date  03/25/19            Plan - 01/22/19 1127    Clinical Impression Statement  Patient instructed in beginning balance and coordination exercise. Patient required mod VCs and min A for gait to improve weight shift and postural control. Patient requires min VCs to improve  stability with gait.  Patients would benefit from additional skilled PT intervention to improve balance/gait safety and reduce fall risk.   Rehab Potential  Good    PT Frequency  2x / week    PT Duration  12 weeks    PT Treatment/Interventions  Gait training;Therapeutic activities;Therapeutic exercise;Balance training;Neuromuscular re-education;Patient/family education;Orthotic Fit/Training;Manual techniques    PT Next Visit Plan  Continue with current program.    PT Home Exercise Plan  No updates this date     Consulted and Agree with Plan of Care  Patient     Family Member Consulted  Spouse        Patient will benefit from skilled therapeutic intervention in order to improve the following deficits and impairments:  Decreased balance, Decreased endurance, Decreased mobility, Difficulty walking, Impaired sensation, Decreased range of motion, Decreased activity tolerance, Decreased coordination, Decreased strength, Pain, Postural dysfunction  Visit Diagnosis: Muscle weakness (generalized)  Other lack of coordination  Pain in right ankle and joints of right foot  Difficulty in walking, not elsewhere classified  Weakness of right leg  Pain in right hip     Problem List Patient Active Problem List   Diagnosis Date Noted  . Vitamin D deficiency 10/29/2018  . Migraine without status migrainosus, not intractable   . Chronic pain syndrome   . Lumbar  disc disease with radiculopathy 07/21/2017  . Lumbar foraminal stenosis 07/21/2017  . Unable to walk 07/21/2017  . Multiple falls 07/21/2017  . Displaced fracture of proximal end of right fibula 07/21/2017  . Lower extremity weakness 05/28/2017  . Right lower quadrant abdominal pain 08/22/2016  . Abnormal TSH 01/10/2016  . Morbid obesity ([place]) 01/12/2014  . Routine general medical examination at a health care facility 10/28/2012  . GERD (gastroesophageal reflux disease) 10/28/2012  . Anxiety 12/20/2011  . Allergic rhinitis 12/20/2011  . Asthma 12/20/2011  . Lapband APL with HH repair 03/31/2011  . Uncontrolled type 2 diabetes mellitus with hyperglycemia, with long-term current use of insulin ([place]) 06/30/2007  . Hyperlipidemia 06/30/2007  . Essential hypertension 06/27/2007    Alanson Puls , PT DPT 01/22/2019, 2:20 PM  [place] MAIN [facility] SERVICES [address] [place], [place], [address] Phone: [phone]   Fax:  [phone]  Name: [name] MRN: [id number] Date of Birth: 1965-04-02

## 2019-01-22 NOTE — Therapy (Signed)
[place] MAIN [facility] SERVICES [address] [place], [place], [address] Phone: [phone]   Fax:  [phone]  Occupational Therapy Treatment  Patient Details  Name: [name] MRN: [id number] Date of Birth: 09/01/1965 Referring Provider (OT): Alma Friendly, MD   Encounter Date: 01/22/2019  OT End of Session - 01/22/19 1112    Visit Number  22    Number of Visits  24    Date for OT Re-Evaluation  03/05/19    Authorization Type  [employer] progress report period starting 11/18/18    Authorization Time Period  09/24/18-12/17/18    OT Start Time  1102    OT Stop Time  1145    OT Time Calculation (min)  43 min    Activity Tolerance  Patient tolerated treatment well    Behavior During Therapy  [facility] for tasks assessed/performed       Past Medical History:  Diagnosis Date  . Allergy   . Amyotrophic lateral sclerosis (ALS) ([place]) 09/23/2018  . Anemia   . Anxiety   . Arthritis   . Asthma   . Colon polyp   . Complication of anesthesia   . Diabetes mellitus   . Edema   . Fatigue   . Hypertension   . IBS (irritable bowel syndrome)   . Lump in female breast   . Methicillin resistant Staphylococcus aureus in conditions classified elsewhere and of unspecified site   . Migraines   . Morbid obesity ([place])   . Night sweats   . Nonspecific abnormal results of thyroid function study   . Other B-complex deficiencies   . Paresthesias   . PONV (postoperative nausea and vomiting)   . Pure hypercholesterolemia   . Reflux   . Sacral fracture ([place])   . Sinus problem   . Symptomatic states associated with artificial menopause   . Thyroid disease   . Type II or unspecified type diabetes mellitus without mention of complication, not stated as uncontrolled   . Unspecified sleep apnea     Past Surgical History:  Procedure Laterality Date  . abcess removal  1997   MRSA  . ABDOMINAL HYSTERECTOMY  1997   complete in 1997, partial was in 1995  . carpal  tunnel release r  07/03/13   Dalldorf  . [place]  . CHOLECYSTECTOMY  03/1989  . COLONOSCOPY  03/21/2011   Normal.  [facility].  [name] KNEE SURGERY  2006  . LAPAROSCOPIC GASTRIC BANDING  05/2009  . TONSILLECTOMY AND ADENOIDECTOMY  1974  . TUBAL LIGATION    . ULNAR TUNNEL RELEASE Right 02/05/2018   Procedure: CUBITAL TUNNEL RELEASE/DECOMPRESSION;  Surgeon: Melrose Nakayama, MD;  Location: [place];  Service: Orthopedics;  Laterality: Right;    There were no vitals filed for this visit.  Subjective Assessment - 01/22/19 1110    Subjective   Pt. is excited about going to the beach with her family with weekend.    Patient is accompanied by:  Family member    Pertinent History  Leafy [name] was recently diagnosed with ALS at [place] [facility].  She has had a recent decrease in strength in L side of body, UE and LE with decreased coordination.  She feels like she is dropping cups more and needs to use both hands for hand to mouth and handwriting has decreased to almost illegible.    Patient Stated Goals  I want to work on handwriting, being able to hold a  cup again and regain strength and coordination in both hands.    Currently in Pain?  Yes    Pain Score  3     Pain Location  Head    Pain Descriptors / Indicators  Aching       OT TREATMENT    Neuro muscular re-education:  Pt. worked on using her bilateral hands for grasping, flipping and stacking 2" large pegs on the Instructo board placed at a tabletop surface. Pt. Worked on removing the the pegs while alternating thumb opposition tot he tip of her 2nd through 5th digits.   Response to Treatment:  Pt. has a 2/10 headache which she attributes to the fact that she needs new glasses. Pt. has been to the eye doctor this past Monday, and is waiting for new glasses.  Pt. is making progress overall, and now drops fewer objects. when performing [facility] skills. Pt continues to work on improving bilateral hand function, motor control, and  [facility] skills.                        OT Education - 01/22/19 1112    Education Details  [facility]    Person(s) Educated  Patient    Methods  Explanation;Demonstration;Verbal cues    Comprehension  Verbalized understanding;Returned demonstration          OT Long Term Goals - 01/08/19 1100      OT LONG TERM GOAL #1   Title  Pt will complete self feeding using adaptive utensils and compenstory techniques using R hand.    Baseline  Pt needs help with holding cup, cutting meat and uses L hand as assist.    Time  12    Period  Weeks    Status  Achieved      OT LONG TERM GOAL #2   Title  Pt will complete LB dressing using adaptive equipment with min assist and cues in unsupported sitting.    Baseline  Now able to do her socks, pants but needs help with AFOs and shoes 12/11/18    Time  12    Period  Weeks    Status  On-going    Target Date  03/05/19      OT LONG TERM GOAL #3   Title  Pt will complete toileting and hygiene with adaptive aid with min assist and cues on elevated toilet with use of grab bar and FWW for balance.    Baseline  improving but still requires occasional assist    Time  12    Period  Weeks    Status  On-going    Target Date  03/05/19      OT LONG TERM GOAL #4   Title  Pt will be educated in HEP for strengthening and fine motor coordination.    Baseline  ongoing additions to home program    Time  12    Period  Weeks    Status  On-going    Target Date  03/05/19      OT LONG TERM GOAL #5   Title  Pt will increase strength  in B hands for use during ADLs by 3#    Baseline  exceeded initial goal, revised to 3 additional #    Time  12    Period  Weeks    Status  On-going    Target Date  03/05/19      OT LONG TERM GOAL #6   Title  Pt  will increase strength 2# in B pinch for lateral and 3 point for use during ADLs.    Baseline  minimal changes in pinch 12/11/2018    Time  12    Period  Weeks    Status  Achieved      OT LONG TERM GOAL #7    Title  Patient will demonstrate improved fine motor coordination skills to minimize dropping items such as pills    Baseline  drops items frequently    Time  12    Period  Weeks    Status  New    Target Date  03/05/19      OT LONG TERM GOAL #8   Title  Patient to demonstrate ability to manipulate buttons with or without use of adaptive equipment.    Baseline  unable to perform buttons and requires assist.    Time  12    Period  Weeks    Status  New    Target Date  03/05/19            Plan - 01/22/19 1113    Clinical Impression Statement Pt. has a 2/10 headache which she attributes to the fact that she needs new glasses. Pt. has been to the eye doctor this past Monday, and is waiting for new glasses.  Pt. is making progress overall, and now drops fewer objects. when performing [facility] skills. Pt continues to work on improving bilateral hand function, motor control, and [facility] skills.    OT Occupational Profile and History  Detailed Assessment- Review of Records and additional review of physical, cognitive, psychosocial history related to current functional performance    Occupational performance deficits (Please refer to evaluation for details):  ADL's;IADL's;Education;Leisure    Body Structure / Function / Physical Skills  Strength;Gait;FMC;IADL;Balance;ADL;Coordination;Endurance;UE functional use;Mobility    Rehab Potential  Good    Clinical Decision Making  Several treatment options, min-mod task modification necessary    Comorbidities Affecting Occupational Performance:  Presence of comorbidities impacting occupational performance    Modification or Assistance to Complete Evaluation   Min-Moderate modification of tasks or assist with assess necessary to complete eval    OT Frequency  2x / week    OT Duration  12 weeks    OT Treatment/Interventions  Moist Heat;Self-care/ADL training;Therapeutic exercise;Patient/family education;Energy conservation;Wellsite geologist;Therapeutic activities;Balance training;DME and/or AE instruction;Manual Therapy;Psychosocial skills training    Consulted and Agree with Plan of Care  Patient       Patient will benefit from skilled therapeutic intervention in order to improve the following deficits and impairments:   Body Structure / Function / Physical Skills: Strength, Gait, FMC, IADL, Balance, ADL, Coordination, Endurance, UE functional use, Mobility       Visit Diagnosis: Muscle weakness (generalized)  Other lack of coordination    Problem List Patient Active Problem List   Diagnosis Date Noted  . Vitamin D deficiency 10/29/2018  . Migraine without status migrainosus, not intractable   . Chronic pain syndrome   . Lumbar disc disease with radiculopathy 07/21/2017  . Lumbar foraminal stenosis 07/21/2017  . Unable to walk 07/21/2017  . Multiple falls 07/21/2017  . Displaced fracture of proximal end of right fibula 07/21/2017  . Lower extremity weakness 05/28/2017  . Right lower quadrant abdominal pain 08/22/2016  . Abnormal TSH 01/10/2016  . Morbid obesity ([place]) 01/12/2014  . Routine general medical examination at a health care facility 10/28/2012  . GERD (gastroesophageal reflux disease) 10/28/2012  . Anxiety 12/20/2011  . Allergic  rhinitis 12/20/2011  . Asthma 12/20/2011  . Lapband APL with HH repair 03/31/2011  . Uncontrolled type 2 diabetes mellitus with hyperglycemia, with long-term current use of insulin ([place]) 06/30/2007  . Hyperlipidemia 06/30/2007  . Essential hypertension 06/27/2007    Harrel Carina, MS, OTR/L 01/22/2019, 11:19 AM  [place] MAIN [facility] SERVICES [address] [place], [place], [address] Phone: [phone]   Fax:  [phone]  Name: NAYANI PINNEO MRN: [id number] Date of Birth: 08/30/1965

## 2019-01-27 ENCOUNTER — Ambulatory Visit: Payer: BC Managed Care – PPO | Admitting: Physical Therapy

## 2019-01-27 ENCOUNTER — Ambulatory Visit: Payer: BC Managed Care – PPO | Admitting: Occupational Therapy

## 2019-01-27 ENCOUNTER — Other Ambulatory Visit: Payer: Self-pay

## 2019-01-27 ENCOUNTER — Encounter: Payer: Self-pay | Admitting: Physical Therapy

## 2019-01-27 ENCOUNTER — Encounter: Payer: Self-pay | Admitting: Occupational Therapy

## 2019-01-27 DIAGNOSIS — M6281 Muscle weakness (generalized): Secondary | ICD-10-CM

## 2019-01-27 DIAGNOSIS — M25551 Pain in right hip: Secondary | ICD-10-CM

## 2019-01-27 DIAGNOSIS — M25571 Pain in right ankle and joints of right foot: Secondary | ICD-10-CM

## 2019-01-27 DIAGNOSIS — R29898 Other symptoms and signs involving the musculoskeletal system: Secondary | ICD-10-CM

## 2019-01-27 DIAGNOSIS — R278 Other lack of coordination: Secondary | ICD-10-CM

## 2019-01-27 DIAGNOSIS — R262 Difficulty in walking, not elsewhere classified: Secondary | ICD-10-CM

## 2019-01-27 NOTE — Therapy (Signed)
[place] MAIN [facility] SERVICES [address] [place], [place], [address] Phone: [phone]   Fax:  [phone]  Physical Therapy Treatment  Patient Details  Name: [name] MRN: [id number] Date of Birth: 1965/09/29 Referring Provider (PT): Cornell Barman   Encounter Date: 01/27/2019  PT End of Session - 01/27/19 1152    Visit Number  38    Date for PT Re-Evaluation  03/25/19    PT Start Time  1761    PT Stop Time  1225    PT Time Calculation (min)  40 min    Equipment Utilized During Treatment  Gait belt    Activity Tolerance  Patient tolerated treatment well    Behavior During Therapy  WFL for tasks assessed/performed       Past Medical History:  Diagnosis Date  . Allergy   . Amyotrophic lateral sclerosis (ALS) ([place]) 09/23/2018  . Anemia   . Anxiety   . Arthritis   . Asthma   . Colon polyp   . Complication of anesthesia   . Diabetes mellitus   . Edema   . Fatigue   . Hypertension   . IBS (irritable bowel syndrome)   . Lump in female breast   . Methicillin resistant Staphylococcus aureus in conditions classified elsewhere and of unspecified site   . Migraines   . Morbid obesity ([place])   . Night sweats   . Nonspecific abnormal results of thyroid function study   . Other B-complex deficiencies   . Paresthesias   . PONV (postoperative nausea and vomiting)   . Pure hypercholesterolemia   . Reflux   . Sacral fracture ([place])   . Sinus problem   . Symptomatic states associated with artificial menopause   . Thyroid disease   . Type II or unspecified type diabetes mellitus without mention of complication, not stated as uncontrolled   . Unspecified sleep apnea     Past Surgical History:  Procedure Laterality Date  . abcess removal  1997   MRSA  . ABDOMINAL HYSTERECTOMY  1997   complete in 1997, partial was in 1995  . carpal tunnel release r  07/03/13   Dalldorf  . [place]  . CHOLECYSTECTOMY   03/1989  . COLONOSCOPY  03/21/2011   Normal.  [facility].  [name] KNEE SURGERY  2006  . LAPAROSCOPIC GASTRIC BANDING  05/2009  . TONSILLECTOMY AND ADENOIDECTOMY  1974  . TUBAL LIGATION    . ULNAR TUNNEL RELEASE Right 02/05/2018   Procedure: CUBITAL TUNNEL RELEASE/DECOMPRESSION;  Surgeon: Melrose Nakayama, MD;  Location: [place];  Service: Orthopedics;  Laterality: Right;    There were no vitals filed for this visit.  Subjective Assessment - 01/27/19 1152    Subjective  Pt reports feeling good today.[name] No pain to report this date.She is going to the beach after her PT treatment.    Patient is accompained by:  Family member    Pertinent History   Patient fx her sacrum 9/18 and  she lost sensation to right foot, and she lost ability to DF her right foot. Patient had back surgery 03/2017 , she was sent home. She was walking with no device indoors and she could get out of the house and do the steps. June 18, 2017  her LLE was getting weaker and the right leg was getting stronger. Patient had follow up with neurosurgery. She was scheduled for a steriod shot Jul 23, 2017, but on May 3  her legs didnt  feel right, she fell 4 times, she fractured her R fibula  on may 3rd, she went to hospital and spent one week. She went to SNF for 1 month. She had another neuro consult, resulting in kDx of  polyneuropathy. Then she had an apt with and [facility] said it was not polyneuropathy, (in [place])  She has a second consultation scheduled Dec 30 for a  Neuromuscular.consult.  She had a scan of the spine T 12-28-17  who then sent her to a thoracic surgeon at the end of November and was recommended to not due surgery. She does not know what her Dx is and she has been having home PT beginning August 25, 2017 . She has had HHPT 2 x  week for the last 5 months. She is able to walk 50 feet with RW. She is able to transfer from wc to stand independently.     How long can you stand comfortably?  5 mins    Patient Stated Goals  to walk without the  RW, or walk better, use the bathroom    Currently in Pain?  No/denies    Pain Score  0-No pain    Pain Onset  In the past 7 days       Treatment: Nu-step x 5 mins with L 5  Standing hip abd with double yellow theraband x 10 x 2  Side stepping with double yellow band x 3 lengths  Sit to stand from chair with arms x 3 reps Transfer wc<>nu-step<. wc x 2  gait with loftstrand crutchs x 20 feet x 3 laps   Patient performed with instruction, verbal cues, tactile cues of therapist: goal: increase tissue extensibility, promote proper posture, improve mobility                     PT Education - 01/27/19 1152    Education provided  Yes    Education Details  HEP    Person(s) Educated  Patient    Methods  Explanation    Comprehension  Verbalized understanding;Returned demonstration;Need further instruction       PT Short Term Goals - 10/23/18 1159      PT SHORT TERM GOAL #1   Title  Patient will be independent in home exercise program to improve strength/mobility for better functional independence with ADLs.    Time  6    Period  Weeks    Status  Partially Met    Target Date  09/10/18        PT Long Term Goals - 01/22/19 1129      PT LONG TERM GOAL #1   Title  Patient will increase 10 meter walk test to >.50 m/s as to improve gait speed for better community ambulation and to reduce fall risk.    Baseline  . 36 m/sec,, 10/23/18 =.43 m/ sec    Time  8    Period  Weeks    Status  Partially Met    Target Date  03/25/19      PT LONG TERM GOAL #2   Title  Patient will transfer sit to stand  from 19 inch mat without assist or anyone supporting AD  to demonstrate improved LE strength to decrease falls risk.     Time  12    Period  Weeks    Status  Achieved    Target Date  03/25/19      PT LONG TERM GOAL #3  Title  Patient will demonstrate stance without AD  x 1 min to demonstrate decreased falls risk.     Baseline  10/23/18 =    Time  12    Period  Weeks     Status  Achieved    Target Date  03/25/19      PT LONG TERM GOAL #4   Title  Patient will  ambulate 500 feet to work towards community ambulation distances.     Baseline  270 feet, 10/23/18= 305 feet    Time  12    Period  Weeks    Status  Partially Met    Target Date  03/25/19      PT LONG TERM GOAL #5   Title  Patient (< 22 years old) will complete five times sit to stand test in < 10 seconds indicating an increased LE strength and improved balance.    Baseline  20.28 m/sec. 10/23/18 =16.82 ( from green chair)    Time  12    Period  Weeks    Status  Partially Met    Target Date  03/25/19      PT LONG TERM GOAL #6   Title  Patient will report a worst pain of 3/10 on VAS in left knee             to improve tolerance with ADLs and reduced symptoms with activities.    Time  12    Period  Weeks    Status  Partially Met    Target Date  03/25/19            Plan - 01/27/19 1153    Clinical Impression Statement Patient   instructed in transfers with rest periods standing. Patient performs intermediate LE exercises in standing and  open and closed chain. Patient will continue to benefit from skilled PT to improve strength, balance and gait. Patient continues to have LE weakness and decreased static and dynamic standing balance.   Rehab Potential  Good    PT Frequency  2x / week    PT Duration  12 weeks    PT Treatment/Interventions  Gait training;Therapeutic activities;Therapeutic exercise;Balance training;Neuromuscular re-education;Patient/family education;Orthotic Fit/Training;Manual techniques    PT Next Visit Plan  Continue with current program.    PT Home Exercise Plan  No updates this date     Consulted and Agree with Plan of Care  Patient    Family Member Consulted  Spouse        Patient will benefit from skilled therapeutic intervention in order to improve the following deficits and impairments:  Decreased balance, Decreased endurance, Decreased mobility, Difficulty walking,  Impaired sensation, Decreased range of motion, Decreased activity tolerance, Decreased coordination, Decreased strength, Pain, Postural dysfunction  Visit Diagnosis: Other lack of coordination  Muscle weakness (generalized)  Pain in right ankle and joints of right foot  Difficulty in walking, not elsewhere classified  Weakness of right leg  Pain in right hip     Problem List Patient Active Problem List   Diagnosis Date Noted  . Vitamin D deficiency 10/29/2018  . Migraine without status migrainosus, not intractable   . Chronic pain syndrome   . Lumbar disc disease with radiculopathy 07/21/2017  . Lumbar foraminal stenosis 07/21/2017  . Unable to walk 07/21/2017  . Multiple falls 07/21/2017  . Displaced fracture of proximal end of right fibula 07/21/2017  . Lower extremity weakness 05/28/2017  . Right lower quadrant abdominal pain 08/22/2016  . Abnormal TSH 01/10/2016  .  Morbid obesity ([place]) 01/12/2014  . Routine general medical examination at a health care facility 10/28/2012  . GERD (gastroesophageal reflux disease) 10/28/2012  . Anxiety 12/20/2011  . Allergic rhinitis 12/20/2011  . Asthma 12/20/2011  . Lapband APL with HH repair 03/31/2011  . Uncontrolled type 2 diabetes mellitus with hyperglycemia, with long-term current use of insulin ([place]) 06/30/2007  . Hyperlipidemia 06/30/2007  . Essential hypertension 06/27/2007    Alanson Puls, PT DPT 01/27/2019, 1:00 PM  [place] MAIN [facility] SERVICES [address] [place], [place], [address] Phone: [phone]   Fax:  [phone]  Name: [name] MRN: [id number] Date of Birth: 1965/05/21

## 2019-01-27 NOTE — Therapy (Signed)
[place] MAIN [facility] SERVICES [address] [place], [place], [address] Phone: [phone]   Fax:  [phone]  Occupational Therapy Treatment  Patient Details  Name: [name] MRN: [id number] Date of Birth: 02-07-1966 Referring Provider (OT): Alma Friendly, MD   Encounter Date: 01/27/2019  OT End of Session - 01/27/19 1110    Visit Number  23    Number of Visits  26    Date for OT Re-Evaluation  03/05/19    Authorization Type  [employer] progress report period starting 11/18/18    OT Start Time  1105    OT Stop Time  1145    OT Time Calculation (min)  40 min    Activity Tolerance  Patient tolerated treatment well    Behavior During Therapy  [facility] for tasks assessed/performed       Past Medical History:  Diagnosis Date  . Allergy   . Amyotrophic lateral sclerosis (ALS) ([place]) 09/23/2018  . Anemia   . Anxiety   . Arthritis   . Asthma   . Colon polyp   . Complication of anesthesia   . Diabetes mellitus   . Edema   . Fatigue   . Hypertension   . IBS (irritable bowel syndrome)   . Lump in female breast   . Methicillin resistant Staphylococcus aureus in conditions classified elsewhere and of unspecified site   . Migraines   . Morbid obesity ([place])   . Night sweats   . Nonspecific abnormal results of thyroid function study   . Other B-complex deficiencies   . Paresthesias   . PONV (postoperative nausea and vomiting)   . Pure hypercholesterolemia   . Reflux   . Sacral fracture ([employer])   . Sinus problem   . Symptomatic states associated with artificial menopause   . Thyroid disease   . Type II or unspecified type diabetes mellitus without mention of complication, not stated as uncontrolled   . Unspecified sleep apnea     Past Surgical History:  Procedure Laterality Date  . abcess removal  1997   MRSA  . ABDOMINAL HYSTERECTOMY  1997   complete in 1997, partial was in 1995  . carpal tunnel release r  07/03/13   Dal[place]orf  .  [place]  . CHOLECYSTECTOMY  03/1989  . COLONOSCOPY  03/21/2011   Normal.  [facility].  [name] KNEE SURGERY  2006  . LAPAROSCOPIC GASTRIC BANDING  05/2009  . TONSILLECTOMY AND ADENOIDECTOMY  1974  . TUBAL LIGATION    . ULNAR TUNNEL RELEASE Right 02/05/2018   Procedure: CUBITAL TUNNEL RELEASE/DECOMPRESSION;  Surgeon: Melrose Nakayama, MD;  Location: [place];  Service: Orthopedics;  Laterality: Right;    There were no vitals filed for this visit.  Subjective Assessment - 01/27/19 1109    Subjective   Pt. went to the beach for the weekend    Patient is accompanied by:  Family member    Pertinent History  [name] was recently diagnosed with ALS at [place] [facility].  She has had a recent decrease in strength in L side of body, UE and LE with decreased coordination.  She feels like she is dropping cups more and needs to use both hands for hand to mouth and handwriting has decreased to almost illegible.    Currently in Pain?  No/denies      OT TREATMENT    Neuro muscular re-education:  Pt. worked on using bilateral hands for grasping 1"  circular pegs using thumb opposition to the tip of her 2nd digit, and thumb. Pt. Worked on translatory skills moving the pegs through her hand tot he tip of her 2nd digit, and thumb to place them in the pegboard. Pt. Worked on removing them from the pegboard using thumb opposition to the tip of her 2nd through 5th digits.  Pt. Worked on grasping, and storing 1/2" circular flat marbles without dropping any unintentionally, and worked on actively dropping them one at a time from her palm.  Pt. reports having had a stressful morning. Pt. is making progress overall with strength, and [facility] skills however reports that she has been having hypersensitivity in bilateral 5th digits, and drops circular objects from her hand during translatory movements. Pt. continues to work on improving BUE strength, and [facility] skills in order to improve, and increase engagement  in overall ADL, and IADL functioning.                       OT Education - 01/27/19 1110    Education Details  [facility]    Person(s) Educated  Patient    Methods  Explanation;Demonstration;Verbal cues    Comprehension  Verbalized understanding;Returned demonstration          OT Long Term Goals - 01/08/19 1100      OT LONG TERM GOAL #1   Title  Pt will complete self feeding using adaptive utensils and compenstory techniques using R hand.    Baseline  Pt needs help with holding cup, cutting meat and uses L hand as assist.    Time  12    Period  Weeks    Status  Achieved      OT LONG TERM GOAL #2   Title  Pt will complete LB dressing using adaptive equipment with min assist and cues in unsupported sitting.    Baseline  Now able to do her socks, pants but needs help with AFOs and shoes 12/11/18    Time  12    Period  Weeks    Status  On-going    Target Date  03/05/19      OT LONG TERM GOAL #3   Title  Pt will complete toileting and hygiene with adaptive aid with min assist and cues on elevated toilet with use of grab bar and FWW for balance.    Baseline  improving but still requires occasional assist    Time  12    Period  Weeks    Status  On-going    Target Date  03/05/19      OT LONG TERM GOAL #4   Title  Pt will be educated in HEP for strengthening and fine motor coordination.    Baseline  ongoing additions to home program    Time  12    Period  Weeks    Status  On-going    Target Date  03/05/19      OT LONG TERM GOAL #5   Title  Pt will increase strength  in B hands for use during ADLs by 3#    Baseline  exceeded initial goal, revised to 3 additional #    Time  12    Period  Weeks    Status  On-going    Target Date  03/05/19      OT LONG TERM GOAL #6   Title  Pt will increase strength 2# in B pinch for lateral and 3 point for use during ADLs.  Baseline  minimal changes in pinch 12/11/2018    Time  12    Period  Weeks    Status  Achieved       OT LONG TERM GOAL #7   Title  Patient will demonstrate improved fine motor coordination skills to minimize dropping items such as pills    Baseline  drops items frequently    Time  12    Period  Weeks    Status  New    Target Date  03/05/19      OT LONG TERM GOAL #8   Title  Patient to demonstrate ability to manipulate buttons with or without use of adaptive equipment.    Baseline  unable to perform buttons and requires assist.    Time  12    Period  Weeks    Status  New    Target Date  03/05/19            Plan - 01/27/19 1113    Clinical Impression Statement  Pt. reports having had a stressful morning. Pt. is making progress overall with strength, and [facility] skills however reports that she has been having hypersensitivity in bilateral 5th digits, and drops circular objects from her hand during translatory movements. Pt. continues to work on improving BUE strength, and [facility] skills in order to improve, and increase engagement in overall ADL, and IADL functioning.    OT Occupational Profile and History  Detailed Assessment- Review of Records and additional review of physical, cognitive, psychosocial history related to current functional performance    Occupational performance deficits (Please refer to evaluation for details):  ADL's;IADL's;Education;Leisure    Body Structure / Function / Physical Skills  Strength;Gait;FMC;IADL;Balance;ADL;Coordination;Endurance;UE functional use;Mobility    Rehab Potential  Good    Clinical Decision Making  Several treatment options, min-mod task modification necessary    Comorbidities Affecting Occupational Performance:  Presence of comorbidities impacting occupational performance    Comorbidities impacting occupational performance description:  progressive muscle weakness, balance, coordination    Modification or Assistance to Complete Evaluation   Min-Moderate modification of tasks or assist with assess necessary to complete eval    OT Frequency  2x  / week    OT Duration  12 weeks    OT Treatment/Interventions  Moist Heat;Self-care/ADL training;Therapeutic exercise;Patient/family education;Energy conservation;Therapist, nutritional;Therapeutic activities;Balance training;DME and/or AE instruction;Manual Therapy;Psychosocial skills training    OT Home Exercise Plan  Pt given medium/red theraputty for home strengthening exercises    Consulted and Agree with Plan of Care  Patient       Patient will benefit from skilled therapeutic intervention in order to improve the following deficits and impairments:   Body Structure / Function / Physical Skills: Strength, Gait, FMC, IADL, Balance, ADL, Coordination, Endurance, UE functional use, Mobility       Visit Diagnosis: Muscle weakness (generalized)  Other lack of coordination    Problem List Patient Active Problem List   Diagnosis Date Noted  . Vitamin D deficiency 10/29/2018  . Migraine without status migrainosus, not intractable   . Chronic pain syndrome   . Lumbar disc disease with radiculopathy 07/21/2017  . Lumbar foraminal stenosis 07/21/2017  . Unable to walk 07/21/2017  . Multiple falls 07/21/2017  . Displaced fracture of proximal end of right fibula 07/21/2017  . Lower extremity weakness 05/28/2017  . Right lower quadrant abdominal pain 08/22/2016  . Abnormal TSH 01/10/2016  . Morbid obesity ([place]) 01/12/2014  . Routine general medical examination at a health care facility 10/28/2012  .  GERD (gastroesophageal reflux disease) 10/28/2012  . Anxiety 12/20/2011  . Allergic rhinitis 12/20/2011  . Asthma 12/20/2011  . Lapband APL with HH repair 03/31/2011  . Uncontrolled type 2 diabetes mellitus with hyperglycemia, with long-term current use of insulin ([place]) 06/30/2007  . Hyperlipidemia 06/30/2007  . Essential hypertension 06/27/2007    Harrel Carina, MS, OTR/L 01/27/2019, 11:43 AM  [place] MAIN [facility] SERVICES [address] [place], [place], [address] Phone: [phone]   Fax:  [phone]  Name: [name] MRN: [id number] Date of Birth: [birth year]

## 2019-01-29 ENCOUNTER — Other Ambulatory Visit: Payer: Self-pay

## 2019-01-29 ENCOUNTER — Encounter: Payer: Self-pay | Admitting: Occupational Therapy

## 2019-01-29 ENCOUNTER — Ambulatory Visit: Payer: BC Managed Care – PPO | Admitting: Occupational Therapy

## 2019-01-29 ENCOUNTER — Ambulatory Visit: Payer: BC Managed Care – PPO | Admitting: Physical Therapy

## 2019-01-29 ENCOUNTER — Encounter: Payer: Self-pay | Admitting: Physical Therapy

## 2019-01-29 DIAGNOSIS — M6281 Muscle weakness (generalized): Secondary | ICD-10-CM

## 2019-01-29 DIAGNOSIS — R278 Other lack of coordination: Secondary | ICD-10-CM

## 2019-01-29 DIAGNOSIS — R29898 Other symptoms and signs involving the musculoskeletal system: Secondary | ICD-10-CM

## 2019-01-29 DIAGNOSIS — M25551 Pain in right hip: Secondary | ICD-10-CM

## 2019-01-29 DIAGNOSIS — R262 Difficulty in walking, not elsewhere classified: Secondary | ICD-10-CM

## 2019-01-29 DIAGNOSIS — M25571 Pain in right ankle and joints of right foot: Secondary | ICD-10-CM

## 2019-01-29 NOTE — Therapy (Signed)
[place] MAIN [facility] SERVICES [address] [place], [place], [address] Phone: [phone]   Fax:  [phone]  Occupational Therapy Treatment  Patient Details  Name: [name] MRN: [id number] Date of Birth: [birth year] Referring Provider (OT): Alma Friendly, MD   Encounter Date: 01/29/2019  OT End of Session - 01/29/19 1115    Visit Number  24    Number of Visits  82    Date for OT Re-Evaluation  03/05/19    Authorization Type  [employer] progress report period starting 11/18/18    OT Start Time  1100    OT Stop Time  1145    OT Time Calculation (min)  45 min    Activity Tolerance  Patient tolerated treatment well    Behavior During Therapy  [facility] for tasks assessed/performed       Past Medical History:  Diagnosis Date  . Allergy   . Amyotrophic lateral sclerosis (ALS) ([place]) 09/23/2018  . Anemia   . Anxiety   . Arthritis   . Asthma   . Colon polyp   . Complication of anesthesia   . Diabetes mellitus   . Edema   . Fatigue   . Hypertension   . IBS (irritable bowel syndrome)   . Lump in female breast   . Methicillin resistant Staphylococcus aureus in conditions classified elsewhere and of unspecified site   . Migraines   . Morbid obesity ([place])   . Night sweats   . Nonspecific abnormal results of thyroid function study   . Other B-complex deficiencies   . Paresthesias   . PONV (postoperative nausea and vomiting)   . Pure hypercholesterolemia   . Reflux   . Sacral fracture ([place])   . Sinus problem   . Symptomatic states associated with artificial menopause   . Thyroid disease   . Type II or unspecified type diabetes mellitus without mention of complication, not stated as uncontrolled   . Unspecified sleep apnea     Past Surgical History:  Procedure Laterality Date  . abcess removal  1997   MRSA  . ABDOMINAL HYSTERECTOMY  1997   complete in 1997, partial was in 1995  . carpal tunnel release r  07/03/13   [place]  .  [place]  . CHOLECYSTECTOMY  03/1989  . COLONOSCOPY  03/21/2011   Normal.  [facility].  [name] KNEE SURGERY  2006  . LAPAROSCOPIC GASTRIC BANDING  05/2009  . TONSILLECTOMY AND ADENOIDECTOMY  1974  . TUBAL LIGATION    . ULNAR TUNNEL RELEASE Right 02/05/2018   Procedure: CUBITAL TUNNEL RELEASE/DECOMPRESSION;  Surgeon: Melrose Nakayama, MD;  Location: [place];  Service: Orthopedics;  Laterality: Right;    There were no vitals filed for this visit.  Subjective Assessment - 01/29/19 1112    Subjective   Pt. reports that she is doing well.    Patient is accompanied by:  Family member    Pertinent History  [name] was recently diagnosed with ALS at [facility].  She has had a recent decrease in strength in L side of body, UE and LE with decreased coordination.  She feels like she is dropping cups more and needs to use both hands for hand to mouth and handwriting has decreased to almost illegible.    Patient Stated Goals  I want to work on handwriting, being able to hold a cup again and regain strength and coordination in both hands.    Currently  in Pain?  No/denies      OT TREATMENT    Neuro muscular re-education:  Pt. worked on grasping, and manipulating 1/2", 3/4", and 1" washers from a magnetic dish using 2 point grasp pattern. Pt. worked on reaching up, stabilizing, and sustaining shoulder elevation while placing the washer over a small precise target on vertical dowels positioned at various angles. Pt. required one hand to stabilize the dowels, while using the opposite hand to remove the washers. Pt. worked on grasping, flipping and stacking 2" large pegs on the [employer] placed at a tabletop surface following a [employer] pattern.  Response to Treatment:  Pt. reports having had a better morning today. Pt. continues to present with limited bilateral [facility] skills which continues to make it difficult to perform daily ADLs, and IADL tasks. Pt. Drops small, and  flat objects when performing translatory movements with the hand. Pt. continues to work on improving motor control, and [facility] skills in order to increase engagement in ADLs, and IADL tasks.                           OT Education - 01/29/19 1115    Education Details  [facility]    Person(s) Educated  Patient    Methods  Explanation;Demonstration;Verbal cues    Comprehension  Verbalized understanding;Returned demonstration          OT Long Term Goals - 01/08/19 1100      OT LONG TERM GOAL #1   Title  Pt will complete self feeding using adaptive utensils and compenstory techniques using R hand.    Baseline  Pt needs help with holding cup, cutting meat and uses L hand as assist.    Time  12    Period  Weeks    Status  Achieved      OT LONG TERM GOAL #2   Title  Pt will complete LB dressing using adaptive equipment with min assist and cues in unsupported sitting.    Baseline  Now able to do her socks, pants but needs help with AFOs and shoes 12/11/18    Time  12    Period  Weeks    Status  On-going    Target Date  03/05/19      OT LONG TERM GOAL #3   Title  Pt will complete toileting and hygiene with adaptive aid with min assist and cues on elevated toilet with use of grab bar and FWW for balance.    Baseline  improving but still requires occasional assist    Time  12    Period  Weeks    Status  On-going    Target Date  03/05/19      OT LONG TERM GOAL #4   Title  Pt will be educated in HEP for strengthening and fine motor coordination.    Baseline  ongoing additions to home program    Time  12    Period  Weeks    Status  On-going    Target Date  03/05/19      OT LONG TERM GOAL #5   Title  Pt will increase strength  in B hands for use during ADLs by 3#    Baseline  exceeded initial goal, revised to 3 additional #    Time  12    Period  Weeks    Status  On-going    Target Date  03/05/19  OT LONG TERM GOAL #6   Title  Pt will increase strength 2# in B  pinch for lateral and 3 point for use during ADLs.    Baseline  minimal changes in pinch 12/11/2018    Time  12    Period  Weeks    Status  Achieved      OT LONG TERM GOAL #7   Title  Patient will demonstrate improved fine motor coordination skills to minimize dropping items such as pills    Baseline  drops items frequently    Time  12    Period  Weeks    Status  New    Target Date  03/05/19      OT LONG TERM GOAL #8   Title  Patient to demonstrate ability to manipulate buttons with or without use of adaptive equipment.    Baseline  unable to perform buttons and requires assist.    Time  12    Period  Weeks    Status  New    Target Date  03/05/19            Plan - 01/29/19 1116    Clinical Impression Statement Pt. reports having had a better morning today. Pt. continues to present with limited bilateral [facility] skills which continues to make it difficult to perform daily ADLs, and IADL tasks. Pt. Drops small, and flat objects when performing translatory movements with the hand. Pt. continues to work on improving motor control, and [facility] skills in order to increase engagement in ADLs, and IADL tasks.    OT Occupational Profile and History  Detailed Assessment- Review of Records and additional review of physical, cognitive, psychosocial history related to current functional performance    Occupational performance deficits (Please refer to evaluation for details):  ADL's;IADL's;Education;Leisure    Body Structure / Function / Physical Skills  Strength;Gait;FMC;IADL;Balance;ADL;Coordination;Endurance;UE functional use;Mobility    Rehab Potential  Good    Clinical Decision Making  Several treatment options, min-mod task modification necessary    Comorbidities Affecting Occupational Performance:  Presence of comorbidities impacting occupational performance    Comorbidities impacting occupational performance description:  progressive muscle weakness, balance, coordination    Modification or  Assistance to Complete Evaluation   Min-Moderate modification of tasks or assist with assess necessary to complete eval    OT Frequency  2x / week    OT Duration  12 weeks    OT Treatment/Interventions  Moist Heat;Self-care/ADL training;Therapeutic exercise;Patient/family education;Energy conservation;Therapist, nutritional;Therapeutic activities;Balance training;DME and/or AE instruction;Manual Therapy;Psychosocial skills training    Consulted and Agree with Plan of Care  Patient       Patient will benefit from skilled therapeutic intervention in order to improve the following deficits and impairments:   Body Structure / Function / Physical Skills: Strength, Gait, FMC, IADL, Balance, ADL, Coordination, Endurance, UE functional use, Mobility       Visit Diagnosis: Muscle weakness (generalized)  Other lack of coordination    Problem List Patient Active Problem List   Diagnosis Date Noted  . Vitamin D deficiency 10/29/2018  . Migraine without status migrainosus, not intractable   . Chronic pain syndrome   . Lumbar disc disease with radiculopathy 07/21/2017  . Lumbar foraminal stenosis 07/21/2017  . Unable to walk 07/21/2017  . Multiple falls 07/21/2017  . Displaced fracture of proximal end of right fibula 07/21/2017  . Lower extremity weakness 05/28/2017  . Right lower quadrant abdominal pain 08/22/2016  . Abnormal TSH 01/10/2016  . Morbid obesity ([place])  01/12/2014  . Routine general medical examination at a health care facility 10/28/2012  . GERD (gastroesophageal reflux disease) 10/28/2012  . Anxiety 12/20/2011  . Allergic rhinitis 12/20/2011  . Asthma 12/20/2011  . Lapband APL with HH repair 03/31/2011  . Uncontrolled type 2 diabetes mellitus with hyperglycemia, with long-term current use of insulin ([place]) 06/30/2007  . Hyperlipidemia 06/30/2007  . Essential hypertension 06/27/2007    Harrel Carina, MS, OTR/L 01/29/2019, 11:41 AM  [place] MAIN [facility] SERVICES [address] [place], [place], [address] Phone: [phone]   Fax:  [phone]  Name: DARLISHA BOOTY MRN: [id number] Date of Birth: 1965-09-02

## 2019-01-29 NOTE — Therapy (Signed)
[place] MAIN [facility] SERVICES [address] [place], [place], [address] Phone: [phone]   Fax:  [phone]  Physical Therapy Treatment  Patient Details  Name: [name] MRN: [id number] Date of Birth: 22-May-1965 Referring Provider (PT): Cornell Barman   Encounter Date: 01/29/2019  PT End of Session - 01/29/19 1122    Visit Number  39    Date for PT Re-Evaluation  03/25/19    PT Start Time  1145    PT Stop Time  1225    PT Time Calculation (min)  40 min    Equipment Utilized During Treatment  Gait belt    Activity Tolerance  Patient tolerated treatment well    Behavior During Therapy  WFL for tasks assessed/performed       Past Medical History:  Diagnosis Date  . Allergy   . Amyotrophic lateral sclerosis (ALS) ([place]) 09/23/2018  . Anemia   . Anxiety   . Arthritis   . Asthma   . Colon polyp   . Complication of anesthesia   . Diabetes mellitus   . Edema   . Fatigue   . Hypertension   . IBS (irritable bowel syndrome)   . Lump in female breast   . Methicillin resistant Staphylococcus aureus in conditions classified elsewhere and of unspecified site   . Migraines   . Morbid obesity ([place])   . Night sweats   . Nonspecific abnormal results of thyroid function study   . Other B-complex deficiencies   . Paresthesias   . PONV (postoperative nausea and vomiting)   . Pure hypercholesterolemia   . Reflux   . Sacral fracture ([place])   . Sinus problem   . Symptomatic states associated with artificial menopause   . Thyroid disease   . Type II or unspecified type diabetes mellitus without mention of complication, not stated as uncontrolled   . Unspecified sleep apnea     Past Surgical History:  Procedure Laterality Date  . abcess removal  1997   MRSA  . ABDOMINAL HYSTERECTOMY  1997   complete in 1997, partial was in 1995  . carpal tunnel release r  07/03/13   Dalldorf  . [place]  . CHOLECYSTECTOMY   03/1989  . COLONOSCOPY  03/21/2011   Normal.  [facility].  [name] KNEE SURGERY  2006  . LAPAROSCOPIC GASTRIC BANDING  05/2009  . TONSILLECTOMY AND ADENOIDECTOMY  1974  . TUBAL LIGATION    . ULNAR TUNNEL RELEASE Right 02/05/2018   Procedure: CUBITAL TUNNEL RELEASE/DECOMPRESSION;  Surgeon: Melrose Nakayama, MD;  Location: [place];  Service: Orthopedics;  Laterality: Right;    There were no vitals filed for this visit.    Treatment: Nu-step x 5 mins L 5 UE and LE  Standing with single hand hold on RW and rotating to the left and throwing a ball diagonal to the right and retrieving it from a lower surface to challenge dynamic standing balance x 10 balls x 2 reps left UE and RUE reaching for balls. Standing with single hand hold on RW and sorting balls with ball sorter from higher level using left hand and then using right hand x 2 reps Standing with single hand hold on Rw and stacking shapes on higher surface and needing to reach up and fwd with left UE and then right UE x 2 reps Patient performed with instruction, verbal cues, tactile cues of therapist: goal: increase tissue extensibility, promote proper posture, improve mobility  PT Education - 01/29/19 1121    Education provided  Yes    Education Details  HEP    Person(s) Educated  Patient    Methods  Explanation;Demonstration;Tactile cues;Verbal cues    Comprehension  Verbalized understanding;Returned demonstration;Verbal cues required;Need further instruction       PT Short Term Goals - 10/23/18 1159      PT SHORT TERM GOAL #1   Title  Patient will be independent in home exercise program to improve strength/mobility for better functional independence with ADLs.    Time  6    Period  Weeks    Status  Partially Met    Target Date  09/10/18        PT Long Term Goals - 01/22/19 1129      PT LONG TERM GOAL #1   Title  Patient will increase 10 meter walk test to >.50 m/s as to improve gait  speed for better community ambulation and to reduce fall risk.    Baseline  . 36 m/sec,, 10/23/18 =.43 m/ sec    Time  8    Period  Weeks    Status  Partially Met    Target Date  03/25/19      PT LONG TERM GOAL #2   Title  Patient will transfer sit to stand  from 19 inch mat without assist or anyone supporting AD  to demonstrate improved LE strength to decrease falls risk.     Time  12    Period  Weeks    Status  Achieved    Target Date  03/25/19      PT LONG TERM GOAL #3   Title  Patient will demonstrate stance without AD  x 1 min to demonstrate decreased falls risk.     Baseline  10/23/18 =    Time  12    Period  Weeks    Status  Achieved    Target Date  03/25/19      PT LONG TERM GOAL #4   Title  Patient will  ambulate 500 feet to work towards community ambulation distances.     Baseline  270 feet, 10/23/18= 305 feet    Time  12    Period  Weeks    Status  Partially Met    Target Date  03/25/19      PT LONG TERM GOAL #5   Title  Patient (< 71 years old) will complete five times sit to stand test in < 10 seconds indicating an increased LE strength and improved balance.    Baseline  20.28 m/sec. 10/23/18 =16.82 ( from green chair)    Time  12    Period  Weeks    Status  Partially Met    Target Date  03/25/19      PT LONG TERM GOAL #6   Title  Patient will report a worst pain of 3/10 on VAS in left knee             to improve tolerance with ADLs and reduced symptoms with activities.    Time  12    Period  Weeks    Status  Partially Met    Target Date  03/25/19            Plan - 01/29/19 1122    Clinical Impression Statement  Patient instructed in intermediate strengthening and balance exercise.  Patient requires min Vcs for correct exercise technique including to improve LE control with standing exercise. Patient  demonstrates better quad control with with weight shifting tasks with 1 rail assist. Patient would benefit from additional skilled PT intervention to improve  balance/gait safety and reduce fall risk.   Rehab Potential  Good    PT Frequency  2x / week    PT Duration  12 weeks    PT Treatment/Interventions  Gait training;Therapeutic activities;Therapeutic exercise;Balance training;Neuromuscular re-education;Patient/family education;Orthotic Fit/Training;Manual techniques    PT Next Visit Plan  Continue with current program.    PT Home Exercise Plan  No updates this date     Consulted and Agree with Plan of Care  Patient    Family Member Consulted  Spouse        Patient will benefit from skilled therapeutic intervention in order to improve the following deficits and impairments:  Decreased balance, Decreased endurance, Decreased mobility, Difficulty walking, Impaired sensation, Decreased range of motion, Decreased activity tolerance, Decreased coordination, Decreased strength, Pain, Postural dysfunction  Visit Diagnosis: Other lack of coordination  Muscle weakness (generalized)  Pain in right ankle and joints of right foot  Difficulty in walking, not elsewhere classified  Weakness of right leg  Pain in right hip     Problem List Patient Active Problem List   Diagnosis Date Noted  . Vitamin D deficiency 10/29/2018  . Migraine without status migrainosus, not intractable   . Chronic pain syndrome   . Lumbar disc disease with radiculopathy 07/21/2017  . Lumbar foraminal stenosis 07/21/2017  . Unable to walk 07/21/2017  . Multiple falls 07/21/2017  . Displaced fracture of proximal end of right fibula 07/21/2017  . Lower extremity weakness 05/28/2017  . Right lower quadrant abdominal pain 08/22/2016  . Abnormal TSH 01/10/2016  . Morbid obesity ([place]) 01/12/2014  . Routine general medical examination at a health care facility 10/28/2012  . GERD (gastroesophageal reflux disease) 10/28/2012  . Anxiety 12/20/2011  . Allergic rhinitis 12/20/2011  . Asthma 12/20/2011  . Lapband APL with HH repair 03/31/2011  . Uncontrolled type 2  diabetes mellitus with hyperglycemia, with long-term current use of insulin ([place]) 06/30/2007  . Hyperlipidemia 06/30/2007  . Essential hypertension 06/27/2007    Alanson Puls, PT DPT 01/29/2019, 11:31 AM  [place] MAIN [facility] SERVICES [address] [place], [place], [address] Phone: [phone]   Fax:  [phone]  Name: [name] MRN: [id number] Date of Birth: 03/15/1966

## 2019-01-30 ENCOUNTER — Encounter: Payer: Self-pay | Admitting: Primary Care

## 2019-01-30 ENCOUNTER — Other Ambulatory Visit: Payer: Self-pay

## 2019-01-30 ENCOUNTER — Ambulatory Visit (INDEPENDENT_AMBULATORY_CARE_PROVIDER_SITE_OTHER): Payer: BC Managed Care – PPO | Admitting: Primary Care

## 2019-01-30 VITALS — BP 126/82 | HR 83 | Temp 97.5°F

## 2019-01-30 DIAGNOSIS — E559 Vitamin D deficiency, unspecified: Secondary | ICD-10-CM | POA: Diagnosis not present

## 2019-01-30 DIAGNOSIS — Z794 Long term (current) use of insulin: Secondary | ICD-10-CM

## 2019-01-30 DIAGNOSIS — E119 Type 2 diabetes mellitus without complications: Secondary | ICD-10-CM

## 2019-01-30 DIAGNOSIS — E1140 Type 2 diabetes mellitus with diabetic neuropathy, unspecified: Secondary | ICD-10-CM

## 2019-01-30 DIAGNOSIS — R7989 Other specified abnormal findings of blood chemistry: Secondary | ICD-10-CM

## 2019-01-30 DIAGNOSIS — Z1231 Encounter for screening mammogram for malignant neoplasm of breast: Secondary | ICD-10-CM

## 2019-01-30 DIAGNOSIS — R29898 Other symptoms and signs involving the musculoskeletal system: Secondary | ICD-10-CM

## 2019-01-30 DIAGNOSIS — E1165 Type 2 diabetes mellitus with hyperglycemia: Secondary | ICD-10-CM

## 2019-01-30 DIAGNOSIS — J3489 Other specified disorders of nose and nasal sinuses: Secondary | ICD-10-CM | POA: Insufficient documentation

## 2019-01-30 LAB — BASIC METABOLIC PANEL
BUN: 17 mg/dL (ref 6–23)
CO2: 27 mEq/L (ref 19–32)
Calcium: 9 mg/dL (ref 8.4–10.5)
Chloride: 100 mEq/L (ref 96–112)
Creatinine, Ser: 0.8 mg/dL (ref 0.40–1.20)
GFR: 74.58 mL/min (ref >60.00)
Glucose, Bld: 221 mg/dL — ABNORMAL HIGH (ref 70–99)
Potassium: 4.7 mEq/L (ref 3.5–5.1)
Sodium: 137 mEq/L (ref 135–145)

## 2019-01-30 LAB — POCT GLYCOSYLATED HEMOGLOBIN (HGB A1C): Hemoglobin A1C: 10.1 % — AB (ref 4.0–5.6)

## 2019-01-30 LAB — TSH: TSH: 4.37 u[IU]/mL (ref 0.35–4.50)

## 2019-01-30 LAB — T4, FREE: Free T4: 0.79 ng/dL (ref 0.60–1.60)

## 2019-01-30 LAB — VITAMIN D 25 HYDROXY (VIT D DEFICIENCY, FRACTURES): VITD: 20.65 ng/mL — ABNORMAL LOW (ref 30.00–100.00)

## 2019-01-30 MED ORDER — LEVEMIR FLEXTOUCH 100 UNIT/ML ~~LOC~~ SOPN: PEN_INJECTOR | SUBCUTANEOUS | 2 refills | Status: DC

## 2019-01-30 MED ORDER — MUPIROCIN 2 % EX OINT: 1.0000 "application " | TOPICAL_OINTMENT | Freq: Two times a day (BID) | CUTANEOUS | 0 refills | Status: DC

## 2019-01-30 MED ORDER — FLUTICASONE PROPIONATE 50 MCG/ACT NA SUSP: 1.0000 | Freq: Two times a day (BID) | NASAL | 0 refills | Status: DC

## 2019-01-30 NOTE — Assessment & Plan Note (Signed)
Continues to follow with ALS clinic and neurology.

## 2019-01-30 NOTE — Patient Instructions (Addendum)
Increase your evening Levemir to 52 units, continue 45 units in the morning.  Continue Novolog 22 units three times daily before meals for now. Start checking your blood sugars before every meal and 2 hours after meal if possible. Send me those readings in 2 weeks.  Continue Trulicity.  Apply the mupirocin twice daily for 1-2 weeks.  Try using Flonase (fluticasone) nasal spray. Instill 1 spray in each nostril twice daily.   Stop by the lab prior to leaving today. I will notify you of your results once received.   Please schedule a follow up appointment in 3 months for diabetes check.  It was a pleasure to see you today!

## 2019-01-30 NOTE — Progress Notes (Signed)
Subjective:    Patient ID: [name], female    DOB: Feb 22, 1966, 53 y.o.   MRN: [id number]  HPI  [name] is a 53 year old female who presents today for follow up. She would also like to discuss a right nasal mass.   Nasal mass to right inner nose that she first noticed one year ago. Denies pain. She's tried Bactroban and tea tree oil without reduction.   1) Type 2 Diabetes:  Current medications include: Levemir 45 units BID, Novolog 22 units TID, Trulicity 1.5 mg weekly.   She is checking her blood glucose 1 times daily and is getting readings of:  AM fasting: 200-250 Before lunch: 150-175 2 hours after lunch: 120-130  Last A1C: 9.2 in August 2020, 10.1 today Last Eye Exam: Completed in November 2020 Last Foot Exam:  Pneumonia Vaccination: Completed in 2018 ACE/ARB: Losartan Statin: atorvastatin   She endorses eating ice cream, baked potatoes, breads. Also some vegetables, lean protein. She is more sedentary given lower extremity weakness.   2) Vitamin D Deficiency/Elevated PTH: Currently managed on vitamin D 50,000 units weekly. Vitamin D level of 7 per neurology so she was initiated on treatment in our office. Repeat level of 18 in September 2020. PTH of 120 in July 2020, repeat level of 48 in September 2020.  3) Elevated TSH: TSH of 4.87 in August 2020. No prior history of hypothyroidism. PTH has been elevated in the past, also with low vitamin D.  BP Readings from Last 3 Encounters:  01/30/19 126/82  10/29/18 126/80  02/27/18 130/80     Review of Systems  HENT:       Nasal growth  Eyes: Negative for visual disturbance.  Respiratory: Negative for shortness of breath.   Cardiovascular: Negative for chest pain.  Neurological: Negative for dizziness.       Past Medical History:  Diagnosis Date  . Allergy   . Amyotrophic lateral sclerosis (ALS) ([place]) 09/23/2018  . Anemia   . Anxiety   . Arthritis   . Asthma   . Colon polyp   . Complication of  anesthesia   . Diabetes mellitus   . Edema   . Fatigue   . Hypertension   . IBS (irritable bowel syndrome)   . Lump in female breast   . Methicillin resistant Staphylococcus aureus in conditions classified elsewhere and of unspecified site   . Migraines   . Morbid obesity ([place])   . Night sweats   . Nonspecific abnormal results of thyroid function study   . Other B-complex deficiencies   . Paresthesias   . PONV (postoperative nausea and vomiting)   . Pure hypercholesterolemia   . Reflux   . Sacral fracture ([place])   . Sinus problem   . Symptomatic states associated with artificial menopause   . Thyroid disease   . Type II or unspecified type diabetes mellitus without mention of complication, not stated as uncontrolled   . Unspecified sleep apnea      Social History   Socioeconomic History  . Marital status: Married    Spouse name: Not on file  . Number of children: 3  . Years of education: college  . Highest education level: Not on file  Occupational History  . Occupation: Data processing manager: [employer]  . Financial resource strain: Not on file  . Food insecurity    Worry: Not on file    Inability: Not on file  .  Transportation needs    Medical: Not on file    Non-medical: Not on file  Tobacco Use  . Smoking status: Never Smoker  . Smokeless tobacco: Never Used  Substance and Sexual Activity  . Alcohol use: Yes    Alcohol/week: 0.0 standard drinks    Comment: rarely - once per week at most-nothing for the past year 01/31/18  . Drug use: No  . Sexual activity: Never    Birth control/protection: None  Lifestyle  . Physical activity    Days per week: Not on file    Minutes per session: Not on file  . Stress: Not on file  Relationships  . Social Herbalist on phone: Not on file    Gets together: Not on file    Attends religious service: Not on file    Active member of club or organization: Not on file    Attends meetings of  clubs or organizations: Not on file    Relationship status: Not on file  . Intimate partner violence    Fear of current or ex partner: Not on file    Emotionally abused: Not on file    Physically abused: Not on file    Forced sexual activity: Not on file  Other Topics Concern  . Not on file  Social History Narrative   Exercise: walking two days per week, 30 minutes.       Married x 29 years, happily married; no abuse.       Children: 3 children; 1 grandchild; two step grandchildren; 1 gg.      Lives: with husband, mother-in-law.      Employment:  [employer] x 1996; Product manager.  [employer] work.      Tobacco: never      Alcohol: socially.  One glass of wine per month.      Exercise:  Walking around campus.        Seatbelt: 100%      Guns: gun safe in garage.    Past Surgical History:  Procedure Laterality Date  . abcess removal  1997   MRSA  . ABDOMINAL HYSTERECTOMY  1997   complete in 1997, partial was in 1995  . carpal tunnel release r  07/03/13   Dalldorf  . [place]  . CHOLECYSTECTOMY  03/1989  . COLONOSCOPY  03/21/2011   Normal.  [facility].  [name] KNEE SURGERY  2006  . LAPAROSCOPIC GASTRIC BANDING  05/2009  . TONSILLECTOMY AND ADENOIDECTOMY  1974  . TUBAL LIGATION    . ULNAR TUNNEL RELEASE Right 02/05/2018   Procedure: CUBITAL TUNNEL RELEASE/DECOMPRESSION;  Surgeon: Melrose Nakayama, MD;  Location: [place];  Service: Orthopedics;  Laterality: Right;    Family History  Problem Relation Age of Onset  . Diabetes Mother   . Heart disease Mother 29       CHF, CAD  . Other Mother        muscle disease  . Hypertension Mother   . Hyperlipidemia Mother   . Arthritis Mother        OA hip L s/p THR  . Diabetes Father   . Heart disease Father        AMI/CABG/valve replacement age 34.  . Stroke Father   . Diabetes Brother   . Heart disease Brother        stent age 17.  . Fibromyalgia Daughter   . Crohn's disease Daughter   . Hypertension Son   .  Alzheimer's disease Maternal Grandmother   . Emphysema Maternal Grandfather   . Heart disease Paternal Grandmother   . Breast cancer Other        2 Aunts    Allergies  Allergen Reactions  . Amoxicillin Shortness Of Breath and Rash  . Bee Venom Anaphylaxis  . Levofloxacin Shortness Of Breath and Rash  . Meloxicam Hives and Rash  . Vancomycin Anaphylaxis  . Sulfa Antibiotics Rash  . Ace Inhibitors Cough  . Codeine   . Meperidine Hcl   . Oxycodone-Acetaminophen   . Penicillins Swelling and Rash    Has patient had a PCN reaction causing immediate rash, facial/tongue/throat swelling, SOB or lightheadedness with hypotension: Yes Has patient had a PCN reaction causing severe rash involving mucus membranes or skin necrosis: Yes Has patient had a PCN reaction that required hospitalization: No Has patient had a PCN reaction occurring within the last 10 years: No If all of the above answers are "NO", then may proceed with Cephalosporin use.   Flo Shanks [Gatifloxacin] Rash    Current Outpatient Medications on File Prior to Visit  Medication Sig Dispense Refill  . acetaminophen (TYLENOL) 650 MG CR tablet Take 650 mg by mouth at bedtime.    [name] albuterol (VENTOLIN HFA) 108 (90 Base) MCG/ACT inhaler INHALE 1-2 PUFFS INTO THE LUNGS EVERY 6 (SIX) HOURS AS NEEDED FOR WHEEZING OR SHORTNESS OF BREATH. 18 g 0  . atorvastatin (LIPITOR) 40 MG tablet TAKE 1 TABLET BY MOUTH EVERYDAY AT BEDTIME 90 tablet 0  . Blood Glucose Monitoring Suppl (ONE TOUCH ULTRA MINI) w/Device KIT Use as instructed to test blood sugar 5 times daily. 1 each 0  . Continuous Blood Gluc Sensor (FREESTYLE LIBRE 14 DAY SENSOR) MISC 1 each by Does not apply route every 14 (fourteen) days. 2 each 0  . cyclobenzaprine (FLEXERIL) 10 MG tablet TAKE 1 TABLET BY MOUTH THREE TIMES A DAY AS NEEDED FOR MUSCLE SPASMS 270 tablet 0  . Dulaglutide (TRULICITY) 1.5 FV/4.9SW SOPN Inject into the skin once weekly for diabetes. 8 mL 3  . FLUoxetine  (PROZAC) 40 MG capsule TAKE 1 CAPSULE BY MOUTH EVERY DAY 90 capsule 1  . fluticasone (FLONASE) 50 MCG/ACT nasal spray Place 2 sprays into both nostrils daily. 16 g 11  . gabapentin (NEURONTIN) 300 MG capsule Take 2 capsules (600 mg total) by mouth 2 (two) times daily. 360 capsule 1  . hydrochlorothiazide (HYDRODIURIL) 25 MG tablet TAKE 1/2 TABLET (12.5 MG) BY MOUTH EVERY DAY. 45 tablet 1  . Insulin Pen Needle 32G X 4 MM MISC USE AS DIRECTED FOR INSULIN INJECTION DAILY 100 each 5  . ketoconazole (NIZORAL) 2 % cream APPLY TO AFFECTED AREA EVERY DAY AS NEEDED FOR IRRITATION 60 g 0  . Lancets (ONETOUCH ULTRASOFT) lancets Use as instructed to test blood sugar 3 times daily 100 each 5  . levocetirizine (XYZAL) 5 MG tablet TAKE ONE TABLET BY MOUTH EVERY EVENING (Patient taking differently: TAKE ONE TABLET (5 mg)  BY MOUTH EVERY EVENING) 90 tablet 1  . losartan (COZAAR) 100 MG tablet Take 0.5 tablets (50 mg total) by mouth daily. 90 tablet 1  . metoprolol tartrate (LOPRESSOR) 25 MG tablet Take 0.5 tablets (12.5 mg total) by mouth 2 (two) times daily. For blood pressure. 90 tablet 1  . Multiple Vitamin (MULTIVITAMIN PO) Take 1 tablet by mouth daily.     [name] NOVOLOG FLEXPEN 100 UNIT/ML FlexPen INJECT 18 UNITS INTO THE SKIN 3 (THREE) TIMES DAILY WITH MEALS. 15 mL  2  . ondansetron (ZOFRAN-ODT) 4 MG disintegrating tablet TAKE 1 TABLET BY MOUTH EVERY 8 HOURS AS NEEDED FOR NAUSEA AS DIRECTED. (Patient taking differently: Take 4 mg by mouth every 8 (eight) hours as needed for nausea or vomiting. TAKE 1 TABLET BY MOUTH EVERY 8 HOURS AS NEEDED FOR NAUSEA AS DIRECTED.) 20 tablet 0  . ONETOUCH ULTRA test strip ONE TOUCH ULTRA MINI USE AS INSTRUCTED TO TEST BLOOD SUGAR 5 TIMES DAILY. 200 strip 5  . VALERIAN ROOT PO Take 1,000 mg by mouth at bedtime.    . Vitamin D, Ergocalciferol, (DRISDOL) 1.25 MG (50000 UT) CAPS capsule TAKE 1 CAPSULE BY MOUTH ONCE WEEKLY 12 capsule 0   No current facility-administered medications on  file prior to visit.     BP 126/82   Pulse 83   Temp (!) 97.5 F (36.4 C) (Temporal)   SpO2 98%    Objective:   Physical Exam  Constitutional: She appears well-nourished.  HENT:  0.25 rounded, raised, flesh colored nasal growth to right inner nares  Neck: Neck supple.  Cardiovascular: Normal rate and regular rhythm.  Respiratory: Effort normal and breath sounds normal.  Skin: Skin is warm and dry.  Psychiatric: She has a normal mood and affect.           Assessment & Plan:

## 2019-01-30 NOTE — Assessment & Plan Note (Signed)
Level of 18 in September 2020 which is an improvement. Compliant to vitamin D weekly. Repeat labs pending.  PTH within normal range from last check per neurology.

## 2019-01-30 NOTE — Assessment & Plan Note (Signed)
Chronic for the last year. Almost appears to be benign nevus but will trial treatment.  Rx for Flonase and Mupirocin provided. She will update.

## 2019-01-30 NOTE — Assessment & Plan Note (Signed)
A1C today worse at 10.1. She is not checking blood glucose levels as directed, only once daily.   Stressed the importance of TID glucose checks, empty glucose logs provided.  Increase evening Levemir to 52 units as AM fasting glucose levels are higher. Will have her start monitoring glucose levels prior to meals and 2 hours after meals and update me in 2 weeks. Continue Novolog 22 units TID. Continue Trulicity.  Follow up in 3 months.

## 2019-01-31 LAB — PARATHYROID HORMONE, INTACT (NO CA): PTH: 51 pg/mL (ref 14–64)

## 2019-02-03 ENCOUNTER — Ambulatory Visit: Payer: BC Managed Care – PPO | Admitting: Occupational Therapy

## 2019-02-03 ENCOUNTER — Ambulatory Visit: Payer: BC Managed Care – PPO | Admitting: Physical Therapy

## 2019-02-03 ENCOUNTER — Other Ambulatory Visit: Payer: Self-pay

## 2019-02-03 ENCOUNTER — Encounter: Payer: Self-pay | Admitting: Physical Therapy

## 2019-02-03 ENCOUNTER — Encounter: Payer: Self-pay | Admitting: Occupational Therapy

## 2019-02-03 DIAGNOSIS — R29898 Other symptoms and signs involving the musculoskeletal system: Secondary | ICD-10-CM

## 2019-02-03 DIAGNOSIS — R262 Difficulty in walking, not elsewhere classified: Secondary | ICD-10-CM

## 2019-02-03 DIAGNOSIS — R278 Other lack of coordination: Secondary | ICD-10-CM

## 2019-02-03 DIAGNOSIS — M6281 Muscle weakness (generalized): Secondary | ICD-10-CM

## 2019-02-03 DIAGNOSIS — M25571 Pain in right ankle and joints of right foot: Secondary | ICD-10-CM

## 2019-02-03 NOTE — Therapy (Signed)
[place] MAIN [facility] SERVICES [address] [place], [place], [address] Phone: [phone]   Fax:  [phone]  Occupational Therapy Treatment  Patient Details  Name: [name] MRN: [id number] Date of Birth: November 27, 1965 Referring Provider (OT): Maria Friendly, MD   Encounter Date: 02/03/2019  OT End of Session - 02/03/19 1749    Visit Number  25    Number of Visits  48    Date for OT Re-Evaluation  03/05/19    Authorization Type  Progress report period starting 11/18/18    Authorization Time Period  09/24/18-12/17/18    OT Start Time  1104    OT Stop Time  1145    OT Time Calculation (min)  41 min    Activity Tolerance  Patient tolerated treatment well    Behavior During Therapy  [facility] for tasks assessed/performed       Past Medical History:  Diagnosis Date  . Allergy   . Amyotrophic lateral sclerosis (ALS) ([place]) 09/23/2018  . Anemia   . Anxiety   . Arthritis   . Asthma   . Colon polyp   . Complication of anesthesia   . Diabetes mellitus   . Edema   . Fatigue   . Hypertension   . IBS (irritable bowel syndrome)   . Lump in female breast   . Methicillin resistant Staphylococcus aureus in conditions classified elsewhere and of unspecified site   . Migraines   . Morbid obesity ([place])   . Night sweats   . Nonspecific abnormal results of thyroid function study   . Other B-complex deficiencies   . Paresthesias   . PONV (postoperative nausea and vomiting)   . Pure hypercholesterolemia   . Reflux   . Sacral fracture ([place])   . Sinus problem   . Symptomatic states associated with artificial menopause   . Thyroid disease   . Type II or unspecified type diabetes mellitus without mention of complication, not stated as uncontrolled   . Unspecified sleep apnea     Past Surgical History:  Procedure Laterality Date  . abcess removal  1997   MRSA  . ABDOMINAL HYSTERECTOMY  1997   complete in 1997, partial was in 1995  . carpal  tunnel release r  07/03/13   [place]al[place]orf  . [place]  . CHOLECYSTECTOMY  03/1989  . COLONOSCOPY  03/21/2011   Normal.  [facility].  [name] KNEE SURGERY  2006  . LAPAROSCOPIC GASTRIC BANDING  05/2009  . TONSILLECTOMY AND ADENOIDECTOMY  1974  . TUBAL LIGATION    . ULNAR TUNNEL RELEASE Right 02/05/2018   Procedure: CUBITAL TUNNEL RELEASE/DECOMPRESSION;  Surgeon: Maria Nakayama, MD;  Location: [employer];  Service: Orthopedics;  Laterality: Right;    There were no vitals filed for this visit.  Subjective Assessment - 02/03/19 1748    Subjective   Pt. reports that her arms are sore today    Patient is accompanied by:  Family member    Pertinent History  [name] was recently diagnosed with ALS at [place] [facility].  She has had a recent decrease in strength in L side of body, UE and LE with decreased coordination.  She feels like she is dropping cups more and needs to use both hands for hand to mouth and handwriting has decreased to almost illegible.    Patient Stated Goals  I want to work on handwriting, being able to hold a cup again and regain strength and  coordination in both hands.    Currently in Pain?  Yes    Pain Score  2     Pain Location  Arm    Pain Orientation  Left;Right    Pain Type  Acute pain    Pain Onset  In the past 7 days      OT TREATMENT    Neuro muscular re-education:  Pt.  focused on improving [facility] with manipulating nuts and bolts positioned vertically. Pt. Was able to screw, and unscrew both the 1" and 1/2" nuts.  Pt. Was able to sustain UEs in elevation while manipulating the nuts, and bolts. Resistance was limited during this task. Pt. Worked on grasping coins from a tabletop surface, translatory skills moving the objects through her hand to her 2nd digit, and thumb to prepare for placing them into a resistive container, and pushing them through the slot while isolating his 2nd digit. Pt. Had more difficulty with the right hand moving the dimes through her  hand, and had difficulty pressing the larger quarters through the slot.the patient. Pt. Worked on grasping 1" circular pegs alternating thumb opposition to the tip of her 2nd through 4th digits in bilateral hands. Pt. performed [facility] tasks using the Grooved pegboard. Pt. worked on grasping the grooved pegs from a horizontal position, and moving the grooved pegs to a vertical position in the hand to prepare for placing them in the grooved slot. Several small items dropped from her hand when performing translatory movements. Pt. Worked on these tasks in preparation for improving the [facility] skills needed to manipulate buttons, and small medication/pills.  Response to Treatment  Pt. reports having soreness in her bilateral foearms, and 5th digits. Resistive activities were limited today. Pt. is improving overall, and is engaging her UEs more during daily tasks at home. Pt. continues to present wtih limited [facility] skills, grip, and pinch strength which affect her ability to use her hands to manipulate medication/pills, and perfrom buttoning. Pt. continues to work improving UE strength, grip strength, pinch strength, motor control, [facility] skills, and well as reviewing adaptive techniques, and work simplification strategies  in order to improve, and maximize independence with ADLs, and IADLs.                            OT Long Term Goals - 01/08/19 1100      OT LONG TERM GOAL #1   Title  Pt will complete self feeding using adaptive utensils and compenstory techniques using R hand.    Baseline  Pt needs help with holding cup, cutting meat and uses L hand as assist.    Time  12    Period  Weeks    Status  Achieved      OT LONG TERM GOAL #2   Title  Pt will complete LB dressing using adaptive equipment with min assist and cues in unsupported sitting.    Baseline  Now able to do her socks, pants but needs help with AFOs and shoes 12/11/18    Time  12    Period  Weeks    Status  On-going     Target Date  03/05/19      OT LONG TERM GOAL #3   Title  Pt will complete toileting and hygiene with adaptive aid with min assist and cues on elevated toilet with use of grab bar and FWW for balance.    Baseline  improving but still requires occasional assist  Time  12    Period  Weeks    Status  On-going    Target Date  03/05/19      OT LONG TERM GOAL #4   Title  Pt will be educated in HEP for strengthening and fine motor coordination.    Baseline  ongoing additions to home program    Time  12    Period  Weeks    Status  On-going    Target Date  03/05/19      OT LONG TERM GOAL #5   Title  Pt will increase strength  in B hands for use during ADLs by 3#    Baseline  exceeded initial goal, revised to 3 additional #    Time  12    Period  Weeks    Status  On-going    Target Date  03/05/19      OT LONG TERM GOAL #6   Title  Pt will increase strength 2# in B pinch for lateral and 3 point for use during ADLs.    Baseline  minimal changes in pinch 12/11/2018    Time  12    Period  Weeks    Status  Achieved      OT LONG TERM GOAL #7   Title  Patient will demonstrate improved fine motor coordination skills to minimize dropping items such as pills    Baseline  drops items frequently    Time  12    Period  Weeks    Status  New    Target Date  03/05/19      OT LONG TERM GOAL #8   Title  Patient to demonstrate ability to manipulate buttons with or without use of adaptive equipment.    Baseline  unable to perform buttons and requires assist.    Time  12    Period  Weeks    Status  New    Target Date  03/05/19            Plan - 02/03/19 1751    Clinical Impression Statement  Pt. reports having soreness in her bilateral foearms, and 5th digits. Resistive activities were limited today. Pt. is improving overall, and is engaging her UEs more during daily tasks at home. Pt. continues to present wtih limited [facility] skills, grip, and pinch strength which affect her ability to use her  hands to manipulate medication/pills, and perfrom buttoning. Pt. continues to work improving UE strength, grip strength, pinch strength, motor control, [facility] skills, and well as reviewing adaptive techniques, and work simplification strategies  in order to improve, and maximize independence with ADLs, and IADLs.    OT Occupational Profile and History  Detailed Assessment- Review of Records and additional review of physical, cognitive, psychosocial history related to current functional performance    Occupational performance deficits (Please refer to evaluation for details):  ADL's;IADL's;Education;Leisure    Body Structure / Function / Physical Skills  Strength;Gait;FMC;IADL;Balance;ADL;Coordination;Endurance;UE functional use;Mobility    Rehab Potential  Good    Clinical Decision Making  Several treatment options, min-mod task modification necessary    Comorbidities Affecting Occupational Performance:  Presence of comorbidities impacting occupational performance    Modification or Assistance to Complete Evaluation   Min-Moderate modification of tasks or assist with assess necessary to complete eval    OT Frequency  2x / week    OT Duration  12 weeks    OT Treatment/Interventions  Moist Heat;Self-care/ADL training;Therapeutic exercise;Patient/family education;Energy conservation;Therapist, nutritional;Therapeutic activities;Balance training;DME and/or AE  instruction;Manual Therapy;Psychosocial skills training    Plan  Rec continued OT 2x per week for 12 weeks    Consulted and Agree with Plan of Care  Patient       Patient will benefit from skilled therapeutic intervention in order to improve the following deficits and impairments:   Body Structure / Function / Physical Skills: Strength, Gait, FMC, IADL, Balance, ADL, Coordination, Endurance, UE functional use, Mobility       Visit Diagnosis: Other lack of coordination    Problem List Patient Active Problem List   Diagnosis Date Noted   . Nasal mass 01/30/2019  . Vitamin D deficiency 10/29/2018  . Migraine without status migrainosus, not intractable   . Chronic pain syndrome   . Lumbar disc disease with radiculopathy 07/21/2017  . Lumbar foraminal stenosis 07/21/2017  . Unable to walk 07/21/2017  . Multiple falls 07/21/2017  . Displaced fracture of proximal end of right fibula 07/21/2017  . Lower extremity weakness 05/28/2017  . Right lower quadrant abdominal pain 08/22/2016  . Abnormal TSH 01/10/2016  . Morbid obesity ([place]) 01/12/2014  . Routine general medical examination at a health care facility 10/28/2012  . GERD (gastroesophageal reflux disease) 10/28/2012  . Anxiety 12/20/2011  . Allergic rhinitis 12/20/2011  . Asthma 12/20/2011  . Lapband APL with HH repair 03/31/2011  . Uncontrolled type 2 diabetes mellitus with hyperglycemia, with long-term current use of insulin ([place]) 06/30/2007  . Hyperlipidemia 06/30/2007  . Essential hypertension 06/27/2007    Harrel Carina, MS, OTR/L 02/03/2019, 6:00 PM  [place] MAIN [facility] SERVICES [address] [place], [place], [address] Phone: [phone]   Fax:  [phone]  Name: [name] MRN: [id number] Date of Birth: June 10, 1965

## 2019-02-03 NOTE — Therapy (Signed)
[place] MAIN [facility] SERVICES [address] [place], [place], [address] Phone: [phone]   Fax:  [phone]  Physical Therapy Treatment  Patient Details  Name: [name] MRN: [id number] Date of Birth: May 21, 1965 Referring Provider (PT): Cornell Barman   Encounter Date: 02/03/2019  PT End of Session - 02/03/19 1115    Visit Number  39    Date for PT Re-Evaluation  03/25/19    PT Start Time  1145    PT Stop Time  1225    PT Time Calculation (min)  40 min    Equipment Utilized During Treatment  Gait belt    Activity Tolerance  Patient tolerated treatment well    Behavior During Therapy  WFL for tasks assessed/performed       Past Medical History:  Diagnosis Date  . Allergy   . Amyotrophic lateral sclerosis (ALS) ([place]) 09/23/2018  . Anemia   . Anxiety   . Arthritis   . Asthma   . Colon polyp   . Complication of anesthesia   . Diabetes mellitus   . Edema   . Fatigue   . Hypertension   . IBS (irritable bowel syndrome)   . Lump in female breast   . Methicillin resistant Staphylococcus aureus in conditions classified elsewhere and of unspecified site   . Migraines   . Morbid obesity ([place])   . Night sweats   . Nonspecific abnormal results of thyroid function study   . Other B-complex deficiencies   . Paresthesias   . PONV (postoperative nausea and vomiting)   . Pure hypercholesterolemia   . Reflux   . Sacral fracture ([place])   . Sinus problem   . Symptomatic states associated with artificial menopause   . Thyroid disease   . Type II or unspecified type diabetes mellitus without mention of complication, not stated as uncontrolled   . Unspecified sleep apnea     Past Surgical History:  Procedure Laterality Date  . abcess removal  1997   MRSA  . ABDOMINAL HYSTERECTOMY  1997   complete in 1997, partial was in 1995  . carpal tunnel release r  07/03/13   [place]orf  . [place]  . CHOLECYSTECTOMY   03/1989  . COLONOSCOPY  03/21/2011   Normal.  [facility].  [name] KNEE SURGERY  2006  . LAPAROSCOPIC GASTRIC BANDING  05/2009  . TONSILLECTOMY AND ADENOIDECTOMY  1974  . TUBAL LIGATION    . ULNAR TUNNEL RELEASE Right 02/05/2018   Procedure: CUBITAL TUNNEL RELEASE/DECOMPRESSION;  Surgeon: [place] Nakayama, MD;  Location: [place];  Service: Orthopedics;  Laterality: Right;    There were no vitals filed for this visit.   Treatment Nu-step x 5 mins L 5, seat and arms at 10 Stretching B ankle/foot calf stretches x 30 sec x 3 BLE Roller stick for left calf tightness and tenderness x 5 mins Therapeutic exercise; sidelying hip abd x 15 sidelying hip flex/ext x 15  Hooklying lying marching with 4 lbs x 15 BLE  Supine hip abd with 4 lbs x 15 BLE x 2 sets Patient performed with instruction, verbal cues, tactile cues of therapist: goal: increase tissue extensibility, promote proper posture, improve mobility                           PT Education - 02/03/19 1115    Education provided  Yes    Education Details  HEP  Person(s) Educated  Patient    Methods  Explanation    Comprehension  Verbalized understanding       PT Short Term Goals - 10/23/18 1159      PT SHORT TERM GOAL #1   Title  Patient will be independent in home exercise program to improve strength/mobility for better functional independence with ADLs.    Time  6    Period  Weeks    Status  Partially Met    Target Date  09/10/18        PT Long Term Goals - 01/22/19 1129      PT LONG TERM GOAL #1   Title  Patient will increase 10 meter walk test to >.50 m/s as to improve gait speed for better community ambulation and to reduce fall risk.    Baseline  . 36 m/sec,, 10/23/18 =.43 m/ sec    Time  8    Period  Weeks    Status  Partially Met    Target Date  03/25/19      PT LONG TERM GOAL #2   Title  Patient will transfer sit to stand  from 19 inch mat without assist or anyone supporting AD  to demonstrate  improved LE strength to decrease falls risk.     Time  12    Period  Weeks    Status  Achieved    Target Date  03/25/19      PT LONG TERM GOAL #3   Title  Patient will demonstrate stance without AD  x 1 min to demonstrate decreased falls risk.     Baseline  10/23/18 =    Time  12    Period  Weeks    Status  Achieved    Target Date  03/25/19      PT LONG TERM GOAL #4   Title  Patient will  ambulate 500 feet to work towards community ambulation distances.     Baseline  270 feet, 10/23/18= 305 feet    Time  12    Period  Weeks    Status  Partially Met    Target Date  03/25/19      PT LONG TERM GOAL #5   Title  Patient (< 69 years old) will complete five times sit to stand test in < 10 seconds indicating an increased LE strength and improved balance.    Baseline  20.28 m/sec. 10/23/18 =16.82 ( from green chair)    Time  12    Period  Weeks    Status  Partially Met    Target Date  03/25/19      PT LONG TERM GOAL #6   Title  Patient will report a worst pain of 3/10 on VAS in left knee             to improve tolerance with ADLs and reduced symptoms with activities.    Time  12    Period  Weeks    Status  Partially Met    Target Date  03/25/19            Plan - 02/03/19 1116    Clinical Impression Statement  Patient instructed in intermediate LE strengthening and intermediate mobiity exercise. Patient required min VCS to improve weight shift and to increase standing time for  better stance control. Patient would benefit from additional skilled PT intervention to improve strength, balance and gait safety.    Rehab Potential  Good    PT Frequency  2x / week    PT Duration  12 weeks    PT Treatment/Interventions  Gait training;Therapeutic activities;Therapeutic exercise;Balance training;Neuromuscular re-education;Patient/family education;Orthotic Fit/Training;Manual techniques    PT Next Visit Plan  Continue with current program.    PT Home Exercise Plan  No updates this date      Consulted and Agree with Plan of Care  Patient    Family Member Consulted  Spouse        Patient will benefit from skilled therapeutic intervention in order to improve the following deficits and impairments:  Decreased balance, Decreased endurance, Decreased mobility, Difficulty walking, Impaired sensation, Decreased range of motion, Decreased activity tolerance, Decreased coordination, Decreased strength, Pain, Postural dysfunction  Visit Diagnosis: Other lack of coordination  Muscle weakness (generalized)  Pain in right ankle and joints of right foot  Difficulty in walking, not elsewhere classified  Weakness of right leg     Problem List Patient Active Problem List   Diagnosis Date Noted  . Nasal mass 01/30/2019  . Vitamin D deficiency 10/29/2018  . Migraine without status migrainosus, not intractable   . Chronic pain syndrome   . Lumbar disc disease with radiculopathy 07/21/2017  . Lumbar foraminal stenosis 07/21/2017  . Unable to walk 07/21/2017  . Multiple falls 07/21/2017  . Displaced fracture of proximal end of right fibula 07/21/2017  . Lower extremity weakness 05/28/2017  . Right lower quadrant abdominal pain 08/22/2016  . Abnormal TSH 01/10/2016  . Morbid obesity ([place]) 01/12/2014  . Routine general medical examination at a health care facility 10/28/2012  . GERD (gastroesophageal reflux disease) 10/28/2012  . Anxiety 12/20/2011  . Allergic rhinitis 12/20/2011  . Asthma 12/20/2011  . Lapband APL with HH repair 03/31/2011  . Uncontrolled type 2 diabetes mellitus with hyperglycemia, with long-term current use of insulin ([place]) 06/30/2007  . Hyperlipidemia 06/30/2007  . Essential hypertension 06/27/2007    Alanson Puls, PT DPT 02/03/2019, 4:08 PM  [place] MAIN [facility] SERVICES [address] [place], [place], [address] Phone: [phone]   Fax:  [phone]  Name: [name] MRN: [id number] Date of  Birth: [birth year]

## 2019-02-05 ENCOUNTER — Encounter: Payer: Self-pay | Admitting: Physical Therapy

## 2019-02-05 ENCOUNTER — Ambulatory Visit: Payer: BC Managed Care – PPO | Admitting: Physical Therapy

## 2019-02-05 ENCOUNTER — Other Ambulatory Visit: Payer: Self-pay

## 2019-02-05 ENCOUNTER — Encounter: Payer: Self-pay | Admitting: Occupational Therapy

## 2019-02-05 ENCOUNTER — Ambulatory Visit: Payer: BC Managed Care – PPO | Admitting: Occupational Therapy

## 2019-02-05 DIAGNOSIS — M25571 Pain in right ankle and joints of right foot: Secondary | ICD-10-CM

## 2019-02-05 DIAGNOSIS — M6281 Muscle weakness (generalized): Secondary | ICD-10-CM

## 2019-02-05 DIAGNOSIS — M25551 Pain in right hip: Secondary | ICD-10-CM

## 2019-02-05 DIAGNOSIS — R29898 Other symptoms and signs involving the musculoskeletal system: Secondary | ICD-10-CM

## 2019-02-05 DIAGNOSIS — R262 Difficulty in walking, not elsewhere classified: Secondary | ICD-10-CM

## 2019-02-05 DIAGNOSIS — R278 Other lack of coordination: Secondary | ICD-10-CM

## 2019-02-05 NOTE — Therapy (Signed)
[place] MAIN [facility] SERVICES [address] [place], [place], [address] Phone: [phone]   Fax:  [phone]  Physical Therapy Treatment Physical Therapy Progress Note   Dates of reporting period 12/11/18  to 02/05/19  Patient Details  Name: TONYIA MARSCHALL MRN: [id number] Date of Birth: 01/20/1966 Referring Provider (PT): Cornell Barman   Encounter Date: 02/05/2019  PT End of Session - 02/05/19 1157    Visit Number  40    Date for PT Re-Evaluation  03/25/19    PT Start Time  2426    PT Stop Time  1230    PT Time Calculation (min)  45 min    Equipment Utilized During Treatment  Gait belt    Activity Tolerance  Patient tolerated treatment well    Behavior During Therapy  WFL for tasks assessed/performed       Past Medical History:  Diagnosis Date  . Allergy   . Amyotrophic lateral sclerosis (ALS) ([place]) 09/23/2018  . Anemia   . Anxiety   . Arthritis   . Asthma   . Colon polyp   . Complication of anesthesia   . Diabetes mellitus   . Edema   . Fatigue   . Hypertension   . IBS (irritable bowel syndrome)   . Lump in female breast   . Methicillin resistant Staphylococcus aureus in conditions classified elsewhere and of unspecified site   . Migraines   . Morbid obesity ([place])   . Night sweats   . Nonspecific abnormal results of thyroid function study   . Other B-complex deficiencies   . Paresthesias   . PONV (postoperative nausea and vomiting)   . Pure hypercholesterolemia   . Reflux   . Sacral fracture ([place])   . Sinus problem   . Symptomatic states associated with artificial menopause   . Thyroid disease   . Type II or unspecified type diabetes mellitus without mention of complication, not stated as uncontrolled   . Unspecified sleep apnea     Past Surgical History:  Procedure Laterality Date  . abcess removal  1997   MRSA  . ABDOMINAL HYSTERECTOMY  1997   complete in 1997, partial was in 1995  . carpal tunnel release  r  07/03/13   Dalldorf  . [place]  . CHOLECYSTECTOMY  03/1989  . COLONOSCOPY  03/21/2011   Normal.  [facility].  [name] KNEE SURGERY  2006  . LAPAROSCOPIC GASTRIC BANDING  05/2009  . TONSILLECTOMY AND ADENOIDECTOMY  1974  . TUBAL LIGATION    . ULNAR TUNNEL RELEASE Right 02/05/2018   Procedure: CUBITAL TUNNEL RELEASE/DECOMPRESSION;  Surgeon: Melrose Nakayama, MD;  Location: [place];  Service: Orthopedics;  Laterality: Right;    There were no vitals filed for this visit.  Subjective Assessment - 02/05/19 1247    Subjective  Pt reports that her right arm is sore .    Patient is accompained by:  Family member    Pertinent History   Patient fx her sacrum 9/18 and  she lost sensation to right foot, and she lost ability to DF her right foot. Patient had back surgery 03/2017 , she was sent home. She was walking with no device indoors and she could get out of the house and do the steps. June 18, 2017  her LLE was getting weaker and the right leg was getting stronger. Patient had follow up with neurosurgery. She was scheduled for a steriod shot Jul 23, 2017, but  on May 3 her legs didnt  feel right, she fell 4 times, she fractured her R fibula  on may 3rd, she went to hospital and spent one week. She went to SNF for 1 month. She had another neuro consult, resulting in kDx of  polyneuropathy. Then she had an apt with and [facility] said it was not polyneuropathy, (in [place])  She has a second consultation scheduled Dec 30 for a  Neuromuscular.consult.  She had a scan of the spine T 12-28-17  who then sent her to a thoracic surgeon at the end of November and was recommended to not due surgery. She does not know what her Dx is and she has been having home PT beginning August 25, 2017 . She has had HHPT 2 x  week for the last 5 months. She is able to walk 50 feet with RW. She is able to transfer from wc to stand independently.     How long can you stand comfortably?  5 mins    Patient Stated Goals  to walk  without the RW, or walk better, use the bathroom    Pain Onset  In the past 7 days        Treatment: Nu-step x 5 mins L 5  Patient performs outcome measures including 5 x sit to stand, 6 MW, 10 MW Goals are assessed for standing and mobility Goals are reviewed for progress with patient                       PT Education - 02/05/19 1157    Education provided  Yes    Education Details  HEP    Person(s) Educated  Patient    Methods  Explanation    Comprehension  Verbalized understanding;Returned demonstration       PT Short Term Goals - 10/23/18 1159      PT SHORT TERM GOAL #1   Title  Patient will be independent in home exercise program to improve strength/mobility for better functional independence with ADLs.    Time  6    Period  Weeks    Status  Partially Met    Target Date  09/10/18        PT Long Term Goals - 02/05/19 1158      PT LONG TERM GOAL #1   Title  Patient will increase 10 meter walk test to >.50 m/s as to improve gait speed for better community ambulation and to reduce fall risk.    Baseline  . 36 m/sec,, 10/23/18 =.43 m/ sec, 02/05/19=.49 m/sec    Time  8    Period  Weeks    Status  Partially Met    Target Date  03/25/19      PT LONG TERM GOAL #2   Title  Patient will transfer sit to stand  from 19 inch mat without assist or anyone supporting AD  to demonstrate improved LE strength to decrease falls risk.     Baseline  02/05/19=    Time  12    Period  Weeks    Status  Achieved      PT LONG TERM GOAL #3   Title  Patient will demonstrate stance without AD  x 1 min to demonstrate decreased falls risk.     Baseline  02/05/19=She is able to stand for 20 seconds without UE support and 1 min with 1 UE support    Time  12    Period  Weeks  Status  Achieved    Target Date  03/25/19      PT LONG TERM GOAL #4   Title  Patient will  ambulate 500 feet to work towards community ambulation distances.     Baseline  270 feet, 10/23/18= 305  feet, 02/05/19 = 300 feet    Time  12    Period  Weeks    Status  Partially Met    Target Date  03/25/19      PT LONG TERM GOAL #5   Title  Patient (< 41 years old) will complete five times sit to stand test in < 10 seconds indicating an increased LE strength and improved balance.    Baseline  20.28 m/sec. 10/23/18 =16.82 ( from green chair)02/05/19=,12.85 sec    Time  12    Period  Weeks    Status  Partially Met    Target Date  03/25/19      PT LONG TERM GOAL #6   Title  Patient will report a worst pain of 3/10 on VAS in left knee             to improve tolerance with ADLs and reduced symptoms with activities.    Baseline  No pain in knees 02/05/19    Time  12    Period  Weeks    Status  Achieved    Target Date  03/25/19            Plan - 02/05/19 1354    Clinical Impression Statement  Patient's condition has the potential to improve in response to therapy. Maximum improvement is yet to be obtained. The anticipated improvement is attainable and reasonable in a generally predictable time.  Patient reports that she can get up off the commode and perform her own after care. She is making progress towards goals and will continue to benefit from skilled PT .    Rehab Potential  Good    PT Frequency  2x / week    PT Duration  12 weeks    PT Treatment/Interventions  Gait training;Therapeutic activities;Therapeutic exercise;Balance training;Neuromuscular re-education;Patient/family education;Orthotic Fit/Training;Manual techniques    PT Next Visit Plan  Continue with current program.    PT Home Exercise Plan  No updates this date     Consulted and Agree with Plan of Care  Patient    Family Member Consulted  Spouse        Patient will benefit from skilled therapeutic intervention in order to improve the following deficits and impairments:  Decreased balance, Decreased endurance, Decreased mobility, Difficulty walking, Impaired sensation, Decreased range of motion, Decreased activity  tolerance, Decreased coordination, Decreased strength, Pain, Postural dysfunction  Visit Diagnosis: Muscle weakness (generalized)  Other lack of coordination  Pain in right ankle and joints of right foot  Difficulty in walking, not elsewhere classified  Weakness of right leg  Pain in right hip     Problem List Patient Active Problem List   Diagnosis Date Noted  . Nasal mass 01/30/2019  . Vitamin D deficiency 10/29/2018  . Migraine without status migrainosus, not intractable   . Chronic pain syndrome   . Lumbar disc disease with radiculopathy 07/21/2017  . Lumbar foraminal stenosis 07/21/2017  . Unable to walk 07/21/2017  . Multiple falls 07/21/2017  . Displaced fracture of proximal end of right fibula 07/21/2017  . Lower extremity weakness 05/28/2017  . Right lower quadrant abdominal pain 08/22/2016  . Abnormal TSH 01/10/2016  . Morbid obesity ([place]) 01/12/2014  .  Routine general medical examination at a health care facility 10/28/2012  . GERD (gastroesophageal reflux disease) 10/28/2012  . Anxiety 12/20/2011  . Allergic rhinitis 12/20/2011  . Asthma 12/20/2011  . Lapband APL with HH repair 03/31/2011  . Uncontrolled type 2 diabetes mellitus with hyperglycemia, with long-term current use of insulin ([place]) 06/30/2007  . Hyperlipidemia 06/30/2007  . Essential hypertension 06/27/2007    Alanson Puls, PT DPT 02/05/2019, 2:00 PM  [place] MAIN [facility] SERVICES [address] [place], [place], [address] Phone: [phone]   Fax:  [phone]  Name: ERMALEE MEALY MRN: [id number] Date of Birth: 1965/06/29

## 2019-02-05 NOTE — Therapy (Signed)
[place] MAIN [facility] SERVICES [address] [place], [place], [address] Phone: [phone]   Fax:  [phone]  Occupational Therapy Treatment  Patient Details  Name: [name] MRN: [id number] Date of Birth: 15-May-1965 Referring Provider (OT): Alma Friendly, MD   Encounter Date: 02/05/2019  OT End of Session - 02/05/19 1115    Visit Number  26    Number of Visits  48    Date for OT Re-Evaluation  03/05/19    Authorization Type  Progress report period starting 11/18/18    OT Start Time  1103    OT Stop Time  1145    OT Time Calculation (min)  42 min    Activity Tolerance  Patient tolerated treatment well    Behavior During Therapy  [facility] for tasks assessed/performed       Past Medical History:  Diagnosis Date  . Allergy   . Amyotrophic lateral sclerosis (ALS) ([place]) 09/23/2018  . Anemia   . Anxiety   . Arthritis   . Asthma   . Colon polyp   . Complication of anesthesia   . Diabetes mellitus   . Edema   . Fatigue   . Hypertension   . IBS (irritable bowel syndrome)   . Lump in female breast   . Methicillin resistant Staphylococcus aureus in conditions classified elsewhere and of unspecified site   . Migraines   . Morbid obesity ([place])   . Night sweats   . Nonspecific abnormal results of thyroid function study   . Other B-complex deficiencies   . Paresthesias   . PONV (postoperative nausea and vomiting)   . Pure hypercholesterolemia   . Reflux   . Sacral fracture ([place])   . Sinus problem   . Symptomatic states associated with artificial menopause   . Thyroid disease   . Type II or unspecified type diabetes mellitus without mention of complication, not stated as uncontrolled   . Unspecified sleep apnea     Past Surgical History:  Procedure Laterality Date  . abcess removal  1997   MRSA  . ABDOMINAL HYSTERECTOMY  1997   complete in 1997, partial was in 1995  . carpal tunnel release r  07/03/13   Dalldorf  . [place]  . CHOLECYSTECTOMY  03/1989  . COLONOSCOPY  03/21/2011   Normal.  [facility].  [name] KNEE SURGERY  2006  . LAPAROSCOPIC GASTRIC BANDING  05/2009  . TONSILLECTOMY AND ADENOIDECTOMY  1974  . TUBAL LIGATION    . ULNAR TUNNEL RELEASE Right 02/05/2018   Procedure: CUBITAL TUNNEL RELEASE/DECOMPRESSION;  Surgeon: Melrose Nakayama, MD;  Location: [place];  Service: Orthopedics;  Laterality: Right;    There were no vitals filed for this visit.  Subjective Assessment - 02/05/19 1112    Subjective   Pt. reports that her arms are sore today    Patient is accompanied by:  Family member    Pertinent History  [name] was recently diagnosed with ALS at [facility].  She has had a recent decrease in strength in L side of body, UE and LE with decreased coordination.  She feels like she is dropping cups more and needs to use both hands for hand to mouth and handwriting has decreased to almost illegible.    Limitations  weakness    Patient Stated Goals  I want to work on handwriting, being able to hold a cup again and regain strength and coordination in  both hands.    Currently in Pain?  Yes    Pain Score  1     Pain Orientation  Right;Left    Pain Descriptors / Indicators  Shooting   shootig when reaching straight out in front of her.   Pain Type  Acute pain      OT TREATMENT    Neuro muscular re-education:  Pt. worked on [facility] skills grasping, and manipulating 1/4" simulated medication tasks. Pt. was able to open the childproof bottles, manipulate the small items, and place them into the pillbox with her right hand. Pt. worked on manipulating 1.5" marbles, and worked on Clinical cytogeneticist moving them through her hand to the tip of her 2nd digit, and thumb. Pt. worked on building PCP Administrator, arts following a Technical brewer.  Response to Treatment:  Pt. reports no pain during the treatment tasks. Pt. reports that 1/10 pain only occurs when stretching her arms straight out in front  of her when shoulders are extended 90 degrees. Pt. had more difficulty connecting the straight Pipe pieces vs the l,a nd T shaped connectors. Pt. was able to open simulated childproof medication bottles, grasp the 1/4" items, as well as pour them onto a spoon when preparing to place them into a pillbox. Pt. has a pillbox with push button openers. Pt. continues to work on improving BUE motor control, and [facility] skills, as well reviewing  adaptive techniques, and work simplifcation skills for ADLs, and IADLs.                         OT Education - 02/05/19 1115    Education Details  [facility]    Person(s) Educated  Patient    Methods  Explanation;Demonstration;Verbal cues    Comprehension  Verbalized understanding;Returned demonstration          OT Long Term Goals - 01/08/19 1100      OT LONG TERM GOAL #1   Title  Pt will complete self feeding using adaptive utensils and compenstory techniques using R hand.    Baseline  Pt needs help with holding cup, cutting meat and uses L hand as assist.    Time  12    Period  Weeks    Status  Achieved      OT LONG TERM GOAL #2   Title  Pt will complete LB dressing using adaptive equipment with min assist and cues in unsupported sitting.    Baseline  Now able to do her socks, pants but needs help with AFOs and shoes 12/11/18    Time  12    Period  Weeks    Status  On-going    Target Date  03/05/19      OT LONG TERM GOAL #3   Title  Pt will complete toileting and hygiene with adaptive aid with min assist and cues on elevated toilet with use of grab bar and FWW for balance.    Baseline  improving but still requires occasional assist    Time  12    Period  Weeks    Status  On-going    Target Date  03/05/19      OT LONG TERM GOAL #4   Title  Pt will be educated in HEP for strengthening and fine motor coordination.    Baseline  ongoing additions to home program    Time  12    Period  Weeks    Status  On-going    Target  Date   03/05/19      OT LONG TERM GOAL #5   Title  Pt will increase strength  in B hands for use during ADLs by 3#    Baseline  exceeded initial goal, revised to 3 additional #    Time  12    Period  Weeks    Status  On-going    Target Date  03/05/19      OT LONG TERM GOAL #6   Title  Pt will increase strength 2# in B pinch for lateral and 3 point for use during ADLs.    Baseline  minimal changes in pinch 12/11/2018    Time  12    Period  Weeks    Status  Achieved      OT LONG TERM GOAL #7   Title  Patient will demonstrate improved fine motor coordination skills to minimize dropping items such as pills    Baseline  drops items frequently    Time  12    Period  Weeks    Status  New    Target Date  03/05/19      OT LONG TERM GOAL #8   Title  Patient to demonstrate ability to manipulate buttons with or without use of adaptive equipment.    Baseline  unable to perform buttons and requires assist.    Time  12    Period  Weeks    Status  New    Target Date  03/05/19            Plan - 02/05/19 1124    Clinical Impression Statement Pt. reports no pain during the treatment tasks. Pt. reports that 1/10 pain only occurs when stretching her arms straight out in front of her when shoulders are extended 90 degrees. Pt. had more difficulty connecting the straight Pipe pieces vs the l,a nd T shaped connectors. Pt. was able to open simulated childproof medication bottles, grasp the 1/4" items, as well as pour them onto a spoon when preparing to place them into a pillbox. Pt. has a pillbox with push button openers. Pt. continues to work on improving BUE motor control, and [facility] skills, as well reviewing  adaptive techniques, and work simplifcation skills for ADLs, and IADLs.    OT Occupational Profile and History  Detailed Assessment- Review of Records and additional review of physical, cognitive, psychosocial history related to current functional performance    Occupational performance deficits  (Please refer to evaluation for details):  ADL's;IADL's;Education;Leisure    Body Structure / Function / Physical Skills  Strength;Gait;FMC;IADL;Balance;ADL;Coordination;Endurance;UE functional use;Mobility    Rehab Potential  Good    Clinical Decision Making  Several treatment options, min-mod task modification necessary    Comorbidities Affecting Occupational Performance:  Presence of comorbidities impacting occupational performance    Comorbidities impacting occupational performance description:  progressive muscle weakness, balance, coordination    Modification or Assistance to Complete Evaluation   Min-Moderate modification of tasks or assist with assess necessary to complete eval    OT Frequency  2x / week    OT Duration  12 weeks    OT Treatment/Interventions  Moist Heat;Self-care/ADL training;Therapeutic exercise;Patient/family education;Energy conservation;Therapist, nutritional;Therapeutic activities;Balance training;DME and/or AE instruction;Manual Therapy;Psychosocial skills training    Plan  Rec continued OT 2x per week for 12 weeks    OT Home Exercise Plan  Pt given medium/red theraputty for home strengthening exercises    Consulted and Agree with Plan of Care  Patient  Patient will benefit from skilled therapeutic intervention in order to improve the following deficits and impairments:   Body Structure / Function / Physical Skills: Strength, Gait, FMC, IADL, Balance, ADL, Coordination, Endurance, UE functional use, Mobility       Visit Diagnosis: Muscle weakness (generalized)  Other lack of coordination    Problem List Patient Active Problem List   Diagnosis Date Noted  . Nasal mass 01/30/2019  . Vitamin D deficiency 10/29/2018  . Migraine without status migrainosus, not intractable   . Chronic pain syndrome   . Lumbar disc disease with radiculopathy 07/21/2017  . Lumbar foraminal stenosis 07/21/2017  . Unable to walk 07/21/2017  . Multiple falls  07/21/2017  . Displaced fracture of proximal end of right fibula 07/21/2017  . Lower extremity weakness 05/28/2017  . Right lower quadrant abdominal pain 08/22/2016  . Abnormal TSH 01/10/2016  . Morbid obesity ([place]) 01/12/2014  . Routine general medical examination at a health care facility 10/28/2012  . GERD (gastroesophageal reflux disease) 10/28/2012  . Anxiety 12/20/2011  . Allergic rhinitis 12/20/2011  . Asthma 12/20/2011  . Lapband APL with HH repair 03/31/2011  . Uncontrolled type 2 diabetes mellitus with hyperglycemia, with long-term current use of insulin ([place]) 06/30/2007  . Hyperlipidemia 06/30/2007  . Essential hypertension 06/27/2007    Harrel Carina, MS, OTR/L 02/05/2019, 11:42 AM  [place] MAIN [facility] SERVICES [address] [place], [place], [address] Phone: [phone]   Fax:  [phone]  Name: [name] MRN: [id number] Date of Birth: [birth year]

## 2019-02-10 ENCOUNTER — Encounter: Payer: Self-pay | Admitting: Occupational Therapy

## 2019-02-10 ENCOUNTER — Ambulatory Visit: Payer: BC Managed Care – PPO | Admitting: Occupational Therapy

## 2019-02-10 ENCOUNTER — Ambulatory Visit: Payer: BC Managed Care – PPO | Admitting: Physical Therapy

## 2019-02-10 ENCOUNTER — Encounter: Payer: Self-pay | Admitting: Physical Therapy

## 2019-02-10 ENCOUNTER — Other Ambulatory Visit: Payer: Self-pay

## 2019-02-10 DIAGNOSIS — M25551 Pain in right hip: Secondary | ICD-10-CM

## 2019-02-10 DIAGNOSIS — R262 Difficulty in walking, not elsewhere classified: Secondary | ICD-10-CM

## 2019-02-10 DIAGNOSIS — M6281 Muscle weakness (generalized): Secondary | ICD-10-CM

## 2019-02-10 DIAGNOSIS — R278 Other lack of coordination: Secondary | ICD-10-CM

## 2019-02-10 DIAGNOSIS — M25571 Pain in right ankle and joints of right foot: Secondary | ICD-10-CM

## 2019-02-10 DIAGNOSIS — R29898 Other symptoms and signs involving the musculoskeletal system: Secondary | ICD-10-CM

## 2019-02-10 NOTE — Therapy (Signed)
[place] MAIN [facility] SERVICES [address] [place], [place], [address] Phone: [phone]   Fax:  [phone]  Occupational Therapy Treatment  Patient Details  Name: [name] MRN: [id number] Date of Birth: February 01, 1966 Referring Provider (OT): Alma Friendly, MD   Encounter Date: 02/10/2019  OT End of Session - 02/10/19 1227    Visit Number  27    Number of Visits  33    Date for OT Re-Evaluation  03/05/19    Authorization Type  Progress report period starting 11/18/18    Authorization Time Period  09/24/18-12/17/18    OT Start Time  1150    OT Stop Time  1235    OT Time Calculation (min)  45 min    Activity Tolerance  Patient tolerated treatment well;Patient limited by pain    Behavior During Therapy  [facility] for tasks assessed/performed       Past Medical History:  Diagnosis Date  . Allergy   . Amyotrophic lateral sclerosis (ALS) ([place]) 09/23/2018  . Anemia   . Anxiety   . Arthritis   . Asthma   . Colon polyp   . Complication of anesthesia   . Diabetes mellitus   . Edema   . Fatigue   . Hypertension   . IBS (irritable bowel syndrome)   . Lump in female breast   . Methicillin resistant Staphylococcus aureus in conditions classified elsewhere and of unspecified site   . Migraines   . Morbid obesity ([place])   . Night sweats   . Nonspecific abnormal results of thyroid function study   . Other B-complex deficiencies   . Paresthesias   . PONV (postoperative nausea and vomiting)   . Pure hypercholesterolemia   . Reflux   . Sacral fracture ([place])   . Sinus problem   . Symptomatic states associated with artificial menopause   . Thyroid disease   . Type II or unspecified type diabetes mellitus without mention of complication, not stated as uncontrolled   . Unspecified sleep apnea     Past Surgical History:  Procedure Laterality Date  . abcess removal  1997   MRSA  . ABDOMINAL HYSTERECTOMY  1997   complete in 1997, partial was  in 1995  . carpal tunnel release r  07/03/13   Dalldorf  . [place]  . CHOLECYSTECTOMY  03/1989  . COLONOSCOPY  03/21/2011   Normal.  [facility].  [name] KNEE SURGERY  2006  . LAPAROSCOPIC GASTRIC BANDING  05/2009  . TONSILLECTOMY AND ADENOIDECTOMY  1974  . TUBAL LIGATION    . ULNAR TUNNEL RELEASE Right 02/05/2018   Procedure: CUBITAL TUNNEL RELEASE/DECOMPRESSION;  Surgeon: Melrose Nakayama, MD;  Location: [facility];  Service: Orthopedics;  Laterality: Right;    There were no vitals filed for this visit.  Subjective Assessment - 02/10/19 1222    Subjective   Pt. reports that her arms are sore today more in R vs L.    Patient is accompanied by:  Family member    Pertinent History  [name] was recently diagnosed with ALS at [place] [facility].  She has had a recent decrease in strength in L side of body, UE and LE with decreased coordination.  She feels like she is dropping cups more and needs to use both hands for hand to mouth and handwriting has decreased to almost illegible.    Limitations  weakness    Patient Stated Goals  I want to  work on Administrator, Civil Service, being able to hold a cup again and regain strength and coordination in both hands.    Currently in Pain?  Yes    Pain Score  3     Pain Location  Elbow    Pain Orientation  Right    Pain Descriptors / Indicators  Aching;Discomfort    Pain Type  Acute pain    Pain Radiating Towards  to fingers    Pain Onset  In the past 7 days    Pain Frequency  Intermittent    Aggravating Factors   rasing her R arm and extedning wrist    Pain Relieving Factors  stretching, normal pain meds not anything new    Effect of Pain on Daily Activities  more painful to complete    Multiple Pain Sites  Yes    Pain Score  1    Pain Location  Elbow    Pain Orientation  Left    Pain Descriptors / Indicators  Aching;Discomfort    Pain Type  Acute pain    Pain Radiating Towards  to fingers    Pain Onset  In the past 7 days    Pain Frequency   Intermittent    Aggravating Factors   anytime I use it    Pain Relieving Factors  stretching and rest    Effect of Pain on Daily Activities  more painful      Therapeutic activities:     Pt reported pain as 3/10 in RUE and 1/10 in LUE but presented with more pain than reported due to high pain tolerance.  She was unable to extend her R wrist with elbow extended without increasing pain. She tolerated trigger point release to B forearms with many muscle spasms. Used interferential setting on tower with electrodes on B forearms at extensor heads and proximal to wrist with 32 on L and 30 on R for pain management.  Discussed how to compensate with ADLs to decrease pain at home as well as use of icing at home in circular motion to decrease pain.  At end of session pt reported no pain in either arm with movement.                     OT Education - 02/10/19 1226    Education Details  pain relief at home for BUEs    Person(s) Educated  Patient    Methods  Explanation;Demonstration;Verbal cues    Comprehension  Verbalized understanding;Returned demonstration          OT Long Term Goals - 01/08/19 1100      OT LONG TERM GOAL #1   Title  Pt will complete self feeding using adaptive utensils and compenstory techniques using R hand.    Baseline  Pt needs help with holding cup, cutting meat and uses L hand as assist.    Time  12    Period  Weeks    Status  Achieved      OT LONG TERM GOAL #2   Title  Pt will complete LB dressing using adaptive equipment with min assist and cues in unsupported sitting.    Baseline  Now able to do her socks, pants but needs help with AFOs and shoes 12/11/18    Time  12    Period  Weeks    Status  On-going    Target Date  03/05/19      OT LONG TERM GOAL #3   Title  Pt  will complete toileting and hygiene with adaptive aid with min assist and cues on elevated toilet with use of grab bar and FWW for balance.    Baseline  improving but still  requires occasional assist    Time  12    Period  Weeks    Status  On-going    Target Date  03/05/19      OT LONG TERM GOAL #4   Title  Pt will be educated in HEP for strengthening and fine motor coordination.    Baseline  ongoing additions to home program    Time  12    Period  Weeks    Status  On-going    Target Date  03/05/19      OT LONG TERM GOAL #5   Title  Pt will increase strength  in B hands for use during ADLs by 3#    Baseline  exceeded initial goal, revised to 3 additional #    Time  12    Period  Weeks    Status  On-going    Target Date  03/05/19      OT LONG TERM GOAL #6   Title  Pt will increase strength 2# in B pinch for lateral and 3 point for use during ADLs.    Baseline  minimal changes in pinch 12/11/2018    Time  12    Period  Weeks    Status  Achieved      OT LONG TERM GOAL #7   Title  Patient will demonstrate improved fine motor coordination skills to minimize dropping items such as pills    Baseline  drops items frequently    Time  12    Period  Weeks    Status  New    Target Date  03/05/19      OT LONG TERM GOAL #8   Title  Patient to demonstrate ability to manipulate buttons with or without use of adaptive equipment.    Baseline  unable to perform buttons and requires assist.    Time  12    Period  Weeks    Status  New    Target Date  03/05/19            Plan - 02/10/19 1228    Clinical Impression Statement  Pt. reports  pain in both forearms and PT updated therapist about status with rec for trigger point release and interferential stimulation to B forearms (32 on L and 30 on R).   Pt. having difficulty using B hands for all ADLs and when going to reach for a glass or roll in bed.  Pt tolerated stretches well and education in pain control at home including icing at intervals. She continues to work on Catering manager, and [facility] skills, as well reviewing  adaptive techniques, and work simplifcation skills for ADLs, and IADLs.     OT Occupational Profile and History  Detailed Assessment- Review of Records and additional review of physical, cognitive, psychosocial history related to current functional performance    Occupational performance deficits (Please refer to evaluation for details):  ADL's;IADL's;Education;Leisure    Body Structure / Function / Physical Skills  Strength;Gait;FMC;IADL;Balance;ADL;Coordination;Endurance;UE functional use;Mobility    Rehab Potential  Good    Clinical Decision Making  Several treatment options, min-mod task modification necessary    Comorbidities Affecting Occupational Performance:  Presence of comorbidities impacting occupational performance    Comorbidities impacting occupational performance description:  progressive muscle weakness, balance, coordination  OT Frequency  2x / week    OT Duration  12 weeks    OT Treatment/Interventions  Moist Heat;Self-care/ADL training;Therapeutic exercise;Patient/family education;Energy conservation;Therapist, nutritional;Therapeutic activities;Balance training;DME and/or AE instruction;Manual Therapy;Psychosocial skills training    Plan  Rec continued OT 2x per week for 12 weeks    OT Home Exercise Plan  Pt educated in use of ice at home using Dixie cups.    Consulted and Agree with Plan of Care  Patient       Patient will benefit from skilled therapeutic intervention in order to improve the following deficits and impairments:   Body Structure / Function / Physical Skills: Strength, Gait, FMC, IADL, Balance, ADL, Coordination, Endurance, UE functional use, Mobility       Visit Diagnosis: Muscle weakness (generalized)  Other lack of coordination    Problem List Patient Active Problem List   Diagnosis Date Noted  . Nasal mass 01/30/2019  . Vitamin D deficiency 10/29/2018  . Migraine without status migrainosus, not intractable   . Chronic pain syndrome   . Lumbar disc disease with radiculopathy 07/21/2017  . Lumbar foraminal  stenosis 07/21/2017  . Unable to walk 07/21/2017  . Multiple falls 07/21/2017  . Displaced fracture of proximal end of right fibula 07/21/2017  . Lower extremity weakness 05/28/2017  . Right lower quadrant abdominal pain 08/22/2016  . Abnormal TSH 01/10/2016  . Morbid obesity ([place]) 01/12/2014  . Routine general medical examination at a health care facility 10/28/2012  . GERD (gastroesophageal reflux disease) 10/28/2012  . Anxiety 12/20/2011  . Allergic rhinitis 12/20/2011  . Asthma 12/20/2011  . Lapband APL with HH repair 03/31/2011  . Uncontrolled type 2 diabetes mellitus with hyperglycemia, with long-term current use of insulin ([place]) 06/30/2007  . Hyperlipidemia 06/30/2007  . Essential hypertension 06/27/2007    Chrys Racer, OTR/L, [facility] ascom [phone] 02/10/19, 12:42 PM     [place] MAIN [facility] SERVICES [address] [place], [place], [address] Phone: [phone]   Fax:  [phone]  Name: [name] MRN: [id number] Date of Birth: April 30, 1965

## 2019-02-10 NOTE — Therapy (Signed)
[place] MAIN [facility] SERVICES [address] [place], [place], [address] Phone: [phone]   Fax:  [phone]  Physical Therapy Treatment  Patient Details  Name: [name] MRN: [id number] Date of Birth: March 29, 1965 Referring Provider (PT): Cornell Barman   Encounter Date: 02/10/2019  PT End of Session - 02/10/19 1107    Visit Number  41    Date for PT Re-Evaluation  03/25/19    PT Start Time  1100    PT Stop Time  1140    PT Time Calculation (min)  40 min    Equipment Utilized During Treatment  Gait belt    Activity Tolerance  Patient tolerated treatment well    Behavior During Therapy  WFL for tasks assessed/performed       Past Medical History:  Diagnosis Date  . Allergy   . Amyotrophic lateral sclerosis (ALS) ([place]) 09/23/2018  . Anemia   . Anxiety   . Arthritis   . Asthma   . Colon polyp   . Complication of anesthesia   . Diabetes mellitus   . Edema   . Fatigue   . Hypertension   . IBS (irritable bowel syndrome)   . Lump in female breast   . Methicillin resistant Staphylococcus aureus in conditions classified elsewhere and of unspecified site   . Migraines   . Morbid obesity ([place])   . Night sweats   . Nonspecific abnormal results of thyroid function study   . Other B-complex deficiencies   . Paresthesias   . PONV (postoperative nausea and vomiting)   . Pure hypercholesterolemia   . Reflux   . Sacral fracture ([place])   . Sinus problem   . Symptomatic states associated with artificial menopause   . Thyroid disease   . Type II or unspecified type diabetes mellitus without mention of complication, not stated as uncontrolled   . Unspecified sleep apnea     Past Surgical History:  Procedure Laterality Date  . abcess removal  1997   MRSA  . ABDOMINAL HYSTERECTOMY  1997   complete in 1997, partial was in 1995  . carpal tunnel release r  07/03/13   Dalldorf  . [place]  . CHOLECYSTECTOMY   03/1989  . COLONOSCOPY  03/21/2011   Normal.  [facility].  [name] KNEE SURGERY  2006  . LAPAROSCOPIC GASTRIC BANDING  05/2009  . TONSILLECTOMY AND ADENOIDECTOMY  1974  . TUBAL LIGATION    . ULNAR TUNNEL RELEASE Right 02/05/2018   Procedure: CUBITAL TUNNEL RELEASE/DECOMPRESSION;  Surgeon: Melrose Nakayama, MD;  Location: [place];  Service: Orthopedics;  Laterality: Right;    There were no vitals filed for this visit.  Subjective Assessment - 02/10/19 1157    Subjective  Pt reports that her right arm is sore .    Patient is accompained by:  Family member    Pertinent History   Patient fx her sacrum 9/18 and  she lost sensation to right foot, and she lost ability to DF her right foot. Patient had back surgery 03/2017 , she was sent home. She was walking with no device indoors and she could get out of the house and do the steps. June 18, 2017  her LLE was getting weaker and the right leg was getting stronger. Patient had follow up with neurosurgery. She was scheduled for a steriod shot Jul 23, 2017, but on May 3 her legs didnt  feel right, she fell 4 times, she  fractured her R fibula  on may 3rd, she went to hospital and spent one week. She went to SNF for 1 month. She had another neuro consult, resulting in kDx of  polyneuropathy. Then she had an apt with and [facility] said it was not polyneuropathy, (in [place])  She has a second consultation scheduled Dec 30 for a  Neuromuscular.consult.  She had a scan of the spine T 12-28-17  who then sent her to a thoracic surgeon at the end of November and was recommended to not due surgery. She does not know what her Dx is and she has been having home PT beginning August 25, 2017 . She has had HHPT 2 x  week for the last 5 months. She is able to walk 50 feet with RW. She is able to transfer from wc to stand independently.     How long can you stand comfortably?  5 mins    Patient Stated Goals  to walk without the RW, or walk better, use the bathroom    Currently in Pain?  Yes     Pain Score  5     Pain Location  Elbow    Pain Orientation  Right    Pain Descriptors / Indicators  Aching    Pain Type  Acute pain    Pain Radiating Towards  na    Pain Onset  In the past 7 days    Pain Frequency  Intermittent    Aggravating Factors   using her right arm    Pain Relieving Factors  stretching    Effect of Pain on Daily Activities  makes it hurt more    Multiple Pain Sites  No      Treatment;  Nu-step L 5 x 5 mins , arms at 10 ,seat at 10 Side stepping in parallel bars RTB x 4 laps (20 feet each) Standing hip abd RTB x 15 BLE x 2 sets Standing hip ext  RTB x 20 BLE x 2 sets   Patient performed with instruction, verbal cues, tactile cues of therapist: goal: increase tissue extensibility, promote proper posture, improve mobility                      PT Education - 02/10/19 1058    Education provided  Yes    Education Details  HEP    Person(s) Educated  Patient    Methods  Explanation    Comprehension  Verbalized understanding;Returned demonstration;Tactile cues required;Need further instruction;Verbal cues required       PT Short Term Goals - 10/23/18 1159      PT SHORT TERM GOAL #1   Title  Patient will be independent in home exercise program to improve strength/mobility for better functional independence with ADLs.    Time  6    Period  Weeks    Status  Partially Met    Target Date  09/10/18        PT Long Term Goals - 02/05/19 1158      PT LONG TERM GOAL #1   Title  Patient will increase 10 meter walk test to >.50 m/s as to improve gait speed for better community ambulation and to reduce fall risk.    Baseline  . 36 m/sec,, 10/23/18 =.43 m/ sec, 02/05/19=.49 m/sec    Time  8    Period  Weeks    Status  Partially Met    Target Date  03/25/19      PT  LONG TERM GOAL #2   Title  Patient will transfer sit to stand  from 19 inch mat without assist or anyone supporting AD  to demonstrate improved LE strength to decrease falls risk.      Baseline  02/05/19=    Time  12    Period  Weeks    Status  Achieved      PT LONG TERM GOAL #3   Title  Patient will demonstrate stance without AD  x 1 min to demonstrate decreased falls risk.     Baseline  02/05/19=She is able to stand for 20 seconds without UE support and 1 min with 1 UE support    Time  12    Period  Weeks    Status  Achieved    Target Date  03/25/19      PT LONG TERM GOAL #4   Title  Patient will  ambulate 500 feet to work towards community ambulation distances.     Baseline  270 feet, 10/23/18= 305 feet, 02/05/19 = 300 feet    Time  12    Period  Weeks    Status  Partially Met    Target Date  03/25/19      PT LONG TERM GOAL #5   Title  Patient (< 78 years old) will complete five times sit to stand test in < 10 seconds indicating an increased LE strength and improved balance.    Baseline  20.28 m/sec. 10/23/18 =16.82 ( from green chair)02/05/19=,12.85 sec    Time  12    Period  Weeks    Status  Partially Met    Target Date  03/25/19      PT LONG TERM GOAL #6   Title  Patient will report a worst pain of 3/10 on VAS in left knee             to improve tolerance with ADLs and reduced symptoms with activities.    Baseline  No pain in knees 02/05/19    Time  12    Period  Weeks    Status  Achieved    Target Date  03/25/19            Plan - 02/10/19 1107    Clinical Impression Statement Patient is able to perform standing exercises in parallel bars with progression of reps and increased resistance for strengthening. She has no pain behaviors and does have fatigue with increased repetitions. Patient has right knee weakness that causes her to feel like it could give away and she is aware of good safety and when to stop and rest her legs. She will continue to benefit from skilled PT to improve mobility and strength.     Rehab Potential  Good    PT Frequency  2x / week    PT Duration  12 weeks    PT Treatment/Interventions  Gait training;Therapeutic  activities;Therapeutic exercise;Balance training;Neuromuscular re-education;Patient/family education;Orthotic Fit/Training;Manual techniques    PT Next Visit Plan  Continue with current program.    PT Home Exercise Plan  No updates this date     Consulted and Agree with Plan of Care  Patient    Family Member Consulted  Spouse        Patient will benefit from skilled therapeutic intervention in order to improve the following deficits and impairments:  Decreased balance, Decreased endurance, Decreased mobility, Difficulty walking, Impaired sensation, Decreased range of motion, Decreased activity tolerance, Decreased coordination, Decreased strength, Pain, Postural dysfunction  Visit  Diagnosis: Other lack of coordination  Muscle weakness (generalized)  Pain in right ankle and joints of right foot  Difficulty in walking, not elsewhere classified  Weakness of right leg  Pain in right hip     Problem List Patient Active Problem List   Diagnosis Date Noted  . Nasal mass 01/30/2019  . Vitamin D deficiency 10/29/2018  . Migraine without status migrainosus, not intractable   . Chronic pain syndrome   . Lumbar disc disease with radiculopathy 07/21/2017  . Lumbar foraminal stenosis 07/21/2017  . Unable to walk 07/21/2017  . Multiple falls 07/21/2017  . Displaced fracture of proximal end of right fibula 07/21/2017  . Lower extremity weakness 05/28/2017  . Right lower quadrant abdominal pain 08/22/2016  . Abnormal TSH 01/10/2016  . Morbid obesity ([place]) 01/12/2014  . Routine general medical examination at a health care facility 10/28/2012  . GERD (gastroesophageal reflux disease) 10/28/2012  . Anxiety 12/20/2011  . Allergic rhinitis 12/20/2011  . Asthma 12/20/2011  . Lapband APL with HH repair 03/31/2011  . Uncontrolled type 2 diabetes mellitus with hyperglycemia, with long-term current use of insulin ([place]) 06/30/2007  . Hyperlipidemia 06/30/2007  . Essential hypertension  06/27/2007    Alanson Puls, PT DPT 02/10/2019, 11:58 AM  [place] MAIN [facility] SERVICES [address] [place], [place], [address] Phone: [phone]   Fax:  [phone]  Name: [name] MRN: [id number] Date of Birth: May 19, 1965

## 2019-02-12 ENCOUNTER — Encounter: Payer: BC Managed Care – PPO | Admitting: Occupational Therapy

## 2019-02-12 ENCOUNTER — Ambulatory Visit: Payer: BC Managed Care – PPO | Admitting: Physical Therapy

## 2019-02-17 ENCOUNTER — Ambulatory Visit: Payer: BC Managed Care – PPO | Admitting: Physical Therapy

## 2019-02-17 ENCOUNTER — Other Ambulatory Visit: Payer: Self-pay

## 2019-02-17 ENCOUNTER — Ambulatory Visit: Payer: BC Managed Care – PPO | Admitting: Occupational Therapy

## 2019-02-17 ENCOUNTER — Encounter: Payer: Self-pay | Admitting: Physical Therapy

## 2019-02-17 ENCOUNTER — Encounter: Payer: Self-pay | Admitting: Occupational Therapy

## 2019-02-17 DIAGNOSIS — M6281 Muscle weakness (generalized): Secondary | ICD-10-CM

## 2019-02-17 DIAGNOSIS — M25571 Pain in right ankle and joints of right foot: Secondary | ICD-10-CM

## 2019-02-17 DIAGNOSIS — R262 Difficulty in walking, not elsewhere classified: Secondary | ICD-10-CM

## 2019-02-17 DIAGNOSIS — R29898 Other symptoms and signs involving the musculoskeletal system: Secondary | ICD-10-CM

## 2019-02-17 DIAGNOSIS — M25551 Pain in right hip: Secondary | ICD-10-CM

## 2019-02-17 DIAGNOSIS — E7800 Pure hypercholesterolemia, unspecified: Secondary | ICD-10-CM

## 2019-02-17 DIAGNOSIS — R278 Other lack of coordination: Secondary | ICD-10-CM

## 2019-02-17 MED ORDER — ATORVASTATIN CALCIUM 40 MG PO TABS: 40.0000 mg | ORAL_TABLET | Freq: Every day | ORAL | 0 refills | Status: DC

## 2019-02-17 NOTE — Therapy (Signed)
[place] MAIN [facility] SERVICES [address] [place], [place], [address] Phone: [phone]   Fax:  [phone]  Physical Therapy Treatment  Patient Details  Name: [name] MRN: [id number] Date of Birth: 10/12/1965 Referring Provider (PT): Cornell Barman   Encounter Date: 02/17/2019  PT End of Session - 02/17/19 1140    Visit Number  42    Date for PT Re-Evaluation  03/25/19    PT Start Time  1146    PT Stop Time  1228    PT Time Calculation (min)  42 min    Equipment Utilized During Treatment  Gait belt    Activity Tolerance  Patient tolerated treatment well    Behavior During Therapy  WFL for tasks assessed/performed       Past Medical History:  Diagnosis Date  . Allergy   . Amyotrophic lateral sclerosis (ALS) ([place]) 09/23/2018  . Anemia   . Anxiety   . Arthritis   . Asthma   . Colon polyp   . Complication of anesthesia   . Diabetes mellitus   . Edema   . Fatigue   . Hypertension   . IBS (irritable bowel syndrome)   . Lump in female breast   . Methicillin resistant Staphylococcus aureus in conditions classified elsewhere and of unspecified site   . Migraines   . Morbid obesity ([place])   . Night sweats   . Nonspecific abnormal results of thyroid function study   . Other B-complex deficiencies   . Paresthesias   . PONV (postoperative nausea and vomiting)   . Pure hypercholesterolemia   . Reflux   . Sacral fracture ([place])   . Sinus problem   . Symptomatic states associated with artificial menopause   . Thyroid disease   . Type II or unspecified type diabetes mellitus without mention of complication, not stated as uncontrolled   . Unspecified sleep apnea     Past Surgical History:  Procedure Laterality Date  . abcess removal  1997   MRSA  . ABDOMINAL HYSTERECTOMY  1997   complete in 1997, partial was in 1995  . carpal tunnel release r  07/03/13   Dal[facility]orf  . [place]  . CHOLECYSTECTOMY   03/1989  . COLONOSCOPY  03/21/2011   Normal.  [facility].  [name] KNEE SURGERY  2006  . LAPAROSCOPIC GASTRIC BANDING  05/2009  . TONSILLECTOMY AND ADENOIDECTOMY  1974  . TUBAL LIGATION    . ULNAR TUNNEL RELEASE Right 02/05/2018   Procedure: CUBITAL TUNNEL RELEASE/DECOMPRESSION;  Surgeon: Melrose Nakayama, MD;  Location: [place];  Service: Orthopedics;  Laterality: Right;    There were no vitals filed for this visit.  Subjective Assessment - 02/17/19 1149    Subjective  Patient reports continued RUE elbow discomfort. She reports no new falls. Reports otherwise doing well.    Patient is accompained by:  Family member    Pertinent History   Patient fx her sacrum 9/18 and  she lost sensation to right foot, and she lost ability to DF her right foot. Patient had back surgery 03/2017 , she was sent home. She was walking with no device indoors and she could get out of the house and do the steps. June 18, 2017  her LLE was getting weaker and the right leg was getting stronger. Patient had follow up with neurosurgery. She was scheduled for a steriod shot Jul 23, 2017, but on May 3 her legs didnt  feel  right, she fell 4 times, she fractured her R fibula  on may 3rd, she went to hospital and spent one week. She went to SNF for 1 month. She had another neuro consult, resulting in kDx of  polyneuropathy. Then she had an apt with and [facility] said it was not polyneuropathy, (in [place])  She has a second consultation scheduled Dec 30 for a  Neuromuscular.consult.  She had a scan of the spine T 12-28-17  who then sent her to a thoracic surgeon at the end of November and was recommended to not due surgery. She does not know what her Dx is and she has been having home PT beginning August 25, 2017 . She has had HHPT 2 x  week for the last 5 months. She is able to walk 50 feet with RW. She is able to transfer from wc to stand independently.     How long can you stand comfortably?  5 mins    Patient Stated Goals  to walk without the RW, or  walk better, use the bathroom    Currently in Pain?  Yes    Pain Score  3     Pain Location  Elbow    Pain Orientation  Right    Pain Descriptors / Indicators  Aching;Discomfort    Pain Type  Acute pain    Pain Onset  In the past 7 days    Pain Frequency  Intermittent    Aggravating Factors   raising or extending RUE; pushing through RUE    Pain Relieving Factors  stretching/heat/ice    Effect of Pain on Daily Activities  decreased activity tolerance;    Multiple Pain Sites  No             Treatment Nu-step x 5 mins L 5, seat at 10, no arms to help reduce UE discomfort; after 2-3 min patient reports increased LE fatigue, reduced resistance to level 2 for better tolerance;   Stretching B ankle/foot calf stretches x 30 sec x 3 BLE Roller stick for left calf tightness and tenderness x 5 mins  Therapeutic exercise; sidelying hip abd x 15 with cues to avoid posterior lean for better hip abductor strengthening;  sidelying passive quad stretch 30 sec x2 reps bilaterally;    Hooklying lying marching with 4 lbs x 15 BLE, required therapist assist to block resting leg for better hooklying positioning;  Hooklying bridges with therapist assist to improve LE positioning to improve muscle activation x10 reps with good hip extension noted;   Supine hip abd with 4 lbs x 15 BLE x 2 sets, with min VCs to avoid hip ER for better abductor activation;  Patient performed with instruction, verbal cues, tactile cues of therapist: goal:increase tissue extensibility, promote proper posture, improve mobility  Tolerated well. Does report mild fatigue at end of session but denies any increase in pain;                      PT Education - 02/17/19 1140    Education provided  Yes    Education Details  LE strengthening, positioning, HEP    Person(s) Educated  Patient    Methods  Explanation;Verbal cues    Comprehension  Verbalized understanding;Verbal cues required;Returned  demonstration;Need further instruction       PT Short Term Goals - 10/23/18 1159      PT SHORT TERM GOAL #1   Title  Patient will be independent in home exercise program to  improve strength/mobility for better functional independence with ADLs.    Time  6    Period  Weeks    Status  Partially Met    Target Date  09/10/18        PT Long Term Goals - 02/05/19 1158      PT LONG TERM GOAL #1   Title  Patient will increase 10 meter walk test to >.50 m/s as to improve gait speed for better community ambulation and to reduce fall risk.    Baseline  . 36 m/sec,, 10/23/18 =.43 m/ sec, 02/05/19=.49 m/sec    Time  8    Period  Weeks    Status  Partially Met    Target Date  03/25/19      PT LONG TERM GOAL #2   Title  Patient will transfer sit to stand  from 19 inch mat without assist or anyone supporting AD  to demonstrate improved LE strength to decrease falls risk.     Baseline  02/05/19=    Time  12    Period  Weeks    Status  Achieved      PT LONG TERM GOAL #3   Title  Patient will demonstrate stance without AD  x 1 min to demonstrate decreased falls risk.     Baseline  02/05/19=She is able to stand for 20 seconds without UE support and 1 min with 1 UE support    Time  12    Period  Weeks    Status  Achieved    Target Date  03/25/19      PT LONG TERM GOAL #4   Title  Patient will  ambulate 500 feet to work towards community ambulation distances.     Baseline  270 feet, 10/23/18= 305 feet, 02/05/19 = 300 feet    Time  12    Period  Weeks    Status  Partially Met    Target Date  03/25/19      PT LONG TERM GOAL #5   Title  Patient (< 28 years old) will complete five times sit to stand test in < 10 seconds indicating an increased LE strength and improved balance.    Baseline  20.28 m/sec. 10/23/18 =16.82 ( from green chair)02/05/19=,12.85 sec    Time  12    Period  Weeks    Status  Partially Met    Target Date  03/25/19      PT LONG TERM GOAL #6   Title  Patient will report a  worst pain of 3/10 on VAS in left knee             to improve tolerance with ADLs and reduced symptoms with activities.    Baseline  No pain in knees 02/05/19    Time  12    Period  Weeks    Status  Achieved    Target Date  03/25/19            Plan - 02/17/19 1244    Clinical Impression Statement  Patient motivated and participated well within session. Patient instructed in supine/sidelying exercise to improve LE strength while reducing strain on UE for less elbow discomfort. Patient tolerated well. She does require min A for proper positioning and min VCs for correct exercise technique and positioning for optimal muscle activation. Patient does fatigue quickly. She requires assistance for don/doff AFO for safety; She would benefit from additional skilled PT intervention to improve strength, balance and mobility;  Rehab Potential  Good    PT Frequency  2x / week    PT Duration  12 weeks    PT Treatment/Interventions  Gait training;Therapeutic activities;Therapeutic exercise;Balance training;Neuromuscular re-education;Patient/family education;Orthotic Fit/Training;Manual techniques    PT Next Visit Plan  Continue with current program.    PT Home Exercise Plan  No updates this date     Consulted and Agree with Plan of Care  Patient    Family Member Consulted  Spouse        Patient will benefit from skilled therapeutic intervention in order to improve the following deficits and impairments:  Decreased balance, Decreased endurance, Decreased mobility, Difficulty walking, Impaired sensation, Decreased range of motion, Decreased activity tolerance, Decreased coordination, Decreased strength, Pain, Postural dysfunction  Visit Diagnosis: Muscle weakness (generalized)  Other lack of coordination  Pain in right ankle and joints of right foot  Difficulty in walking, not elsewhere classified  Weakness of right leg  Pain in right hip     Problem List Patient Active Problem List    Diagnosis Date Noted  . Nasal mass 01/30/2019  . Vitamin D deficiency 10/29/2018  . Migraine without status migrainosus, not intractable   . Chronic pain syndrome   . Lumbar disc disease with radiculopathy 07/21/2017  . Lumbar foraminal stenosis 07/21/2017  . Unable to walk 07/21/2017  . Multiple falls 07/21/2017  . Displaced fracture of proximal end of right fibula 07/21/2017  . Lower extremity weakness 05/28/2017  . Right lower quadrant abdominal pain 08/22/2016  . Abnormal TSH 01/10/2016  . Morbid obesity ([place]) 01/12/2014  . Routine general medical examination at a health care facility 10/28/2012  . GERD (gastroesophageal reflux disease) 10/28/2012  . Anxiety 12/20/2011  . Allergic rhinitis 12/20/2011  . Asthma 12/20/2011  . Lapband APL with HH repair 03/31/2011  . Uncontrolled type 2 diabetes mellitus with hyperglycemia, with long-term current use of insulin ([place]) 06/30/2007  . Hyperlipidemia 06/30/2007  . Essential hypertension 06/27/2007    Charnese Federici PT, DPT 02/17/2019, 12:53 PM  [place] MAIN [facility] SERVICES [address] [place], [place], [address] Phone: [phone]   Fax:  [phone]  Name: JEMILA CAMILLE MRN: [id number] Date of Birth: 03/04/1966

## 2019-02-17 NOTE — Therapy (Signed)
[place] MAIN [facility] SERVICES [address] [place], [place], [address] Phone: [phone]   Fax:  [phone]  Occupational Therapy Treatment  Patient Details  Name: [name] MRN: [id number] Date of Birth: 1965/05/20 Referring Provider (OT): Alma Friendly, MD   Encounter Date: 02/17/2019  OT End of Session - 02/17/19 1316    Visit Number  28    Number of Visits  48    Date for OT Re-Evaluation  03/05/19    Authorization Type  Progress report period starting 11/18/18    OT Start Time  1100    OT Stop Time  1145    OT Time Calculation (min)  45 min    Activity Tolerance  Patient tolerated treatment well;Patient limited by pain    Behavior During Therapy  [facility] for tasks assessed/performed       Past Medical History:  Diagnosis Date  . Allergy   . Amyotrophic lateral sclerosis (ALS) ([place]) 09/23/2018  . Anemia   . Anxiety   . Arthritis   . Asthma   . Colon polyp   . Complication of anesthesia   . Diabetes mellitus   . Edema   . Fatigue   . Hypertension   . IBS (irritable bowel syndrome)   . Lump in female breast   . Methicillin resistant Staphylococcus aureus in conditions classified elsewhere and of unspecified site   . Migraines   . Morbid obesity ([place])   . Night sweats   . Nonspecific abnormal results of thyroid function study   . Other B-complex deficiencies   . Paresthesias   . PONV (postoperative nausea and vomiting)   . Pure hypercholesterolemia   . Reflux   . Sacral fracture ([place])   . Sinus problem   . Symptomatic states associated with artificial menopause   . Thyroid disease   . Type II or unspecified type diabetes mellitus without mention of complication, not stated as uncontrolled   . Unspecified sleep apnea     Past Surgical History:  Procedure Laterality Date  . abcess removal  1997   MRSA  . ABDOMINAL HYSTERECTOMY  1997   complete in 1997, partial was in 1995  . carpal tunnel release r  07/03/13    Dal[facility]orf  . [place]  . CHOLECYSTECTOMY  03/1989  . COLONOSCOPY  03/21/2011   Normal.  [facility].  [name] KNEE SURGERY  2006  . LAPAROSCOPIC GASTRIC BANDING  05/2009  . TONSILLECTOMY AND ADENOIDECTOMY  1974  . TUBAL LIGATION    . ULNAR TUNNEL RELEASE Right 02/05/2018   Procedure: CUBITAL TUNNEL RELEASE/DECOMPRESSION;  Surgeon: Melrose Nakayama, MD;  Location: [place];  Service: Orthopedics;  Laterality: Right;    There were no vitals filed for this visit.  Subjective Assessment - 02/17/19 1150    Subjective   Pt. reports that her RUE pain has improved since last week, however continues to have pain.    Patient is accompanied by:  Family member    Pertinent History  [name] was recently diagnosed with ALS at [facility].  She has had a recent decrease in strength in L side of body, UE and LE with decreased coordination.  She feels like she is dropping cups more and needs to use both hands for hand to mouth and handwriting has decreased to almost illegible.    Patient Stated Goals  I want to work on handwriting, being able to hold a cup again and  regain strength and coordination in both hands.    Currently in Pain?  Yes    Pain Score  3     Pain Orientation  Right    Pain Descriptors / Indicators  Aching    Pain Type  Acute pain    Pain Radiating Towards  elbow, forearm    Pain Onset  In the past 7 days    Pain Frequency  Intermittent    Aggravating Factors   Full elbow extension.      OT TREATMENT    Pt. education was provided about positioning of the RUE with pillows for nighttime. Pt. Tolerated manual techniques, soft tissue mobes at the forearm for supination, and cross friction massage. Manual techniques were performed prior to, and independent of therapeutic Ex. Pt. Performed ROM for elbow flexion, extension, forearm supination, and wrist extension. Pt. reports being able to unscrew toothpaste cap this week without pain, which is improved from last week.  Pt. education was provided about positioning, icing, and heat at home, as well as resting her RUE, and avoiding lifting, pulling,or pushing with resistance.  Response to Treatment:  Pt. reports having 2-3/10 pain in her right elbow when elbow is extended at the end ROM only. Pt. initially had some discomfort with supination, however improved by the end of the session. Pt. tolerated manual techniques well. Pt. continues to work on improving UE functioning during ADls, and IADL tasks as well as compensatory techniques, and startegies for independence with ADLs, and IADLs.                      OT Education - 02/17/19 1316    Education Details  Positioning, UE care    Person(s) Educated  Patient    Methods  Explanation;Demonstration;Verbal cues    Comprehension  Verbalized understanding;Returned demonstration          OT Long Term Goals - 01/08/19 1100      OT LONG TERM GOAL #1   Title  Pt will complete self feeding using adaptive utensils and compenstory techniques using R hand.    Baseline  Pt needs help with holding cup, cutting meat and uses L hand as assist.    Time  12    Period  Weeks    Status  Achieved      OT LONG TERM GOAL #2   Title  Pt will complete LB dressing using adaptive equipment with min assist and cues in unsupported sitting.    Baseline  Now able to do her socks, pants but needs help with AFOs and shoes 12/11/18    Time  12    Period  Weeks    Status  On-going    Target Date  03/05/19      OT LONG TERM GOAL #3   Title  Pt will complete toileting and hygiene with adaptive aid with min assist and cues on elevated toilet with use of grab bar and FWW for balance.    Baseline  improving but still requires occasional assist    Time  12    Period  Weeks    Status  On-going    Target Date  03/05/19      OT LONG TERM GOAL #4   Title  Pt will be educated in HEP for strengthening and fine motor coordination.    Baseline  ongoing additions to home  program    Time  12    Period  Weeks    Status  On-going    Target Date  03/05/19      OT LONG TERM GOAL #5   Title  Pt will increase strength  in B hands for use during ADLs by 3#    Baseline  exceeded initial goal, revised to 3 additional #    Time  12    Period  Weeks    Status  On-going    Target Date  03/05/19      OT LONG TERM GOAL #6   Title  Pt will increase strength 2# in B pinch for lateral and 3 point for use during ADLs.    Baseline  minimal changes in pinch 12/11/2018    Time  12    Period  Weeks    Status  Achieved      OT LONG TERM GOAL #7   Title  Patient will demonstrate improved fine motor coordination skills to minimize dropping items such as pills    Baseline  drops items frequently    Time  12    Period  Weeks    Status  New    Target Date  03/05/19      OT LONG TERM GOAL #8   Title  Patient to demonstrate ability to manipulate buttons with or without use of adaptive equipment.    Baseline  unable to perform buttons and requires assist.    Time  12    Period  Weeks    Status  New    Target Date  03/05/19            Plan - 02/17/19 1317    Clinical Impression Statement  Pt. reports having 2-3/10 pain in her right elbow when elbow is extended at the end ROM only. Pt. initially had some discomfort with supination, however improved by the end of the session. Pt. tolerated manual techniques well. Pt. continues to work on improving UE functioning during ADls, and IADL tasks as well as compensatory techniques, and startegies for independence with ADLs, and IADLs.    OT Occupational Profile and History  Detailed Assessment- Review of Records and additional review of physical, cognitive, psychosocial history related to current functional performance    Occupational performance deficits (Please refer to evaluation for details):  ADL's;IADL's;Education;Leisure    Body Structure / Function / Physical Skills   Strength;Gait;FMC;IADL;Balance;ADL;Coordination;Endurance;UE functional use;Mobility    Rehab Potential  Good    Clinical Decision Making  Several treatment options, min-mod task modification necessary    Comorbidities Affecting Occupational Performance:  Presence of comorbidities impacting occupational performance    Comorbidities impacting occupational performance description:  progressive muscle weakness, balance, coordination    Modification or Assistance to Complete Evaluation   Min-Moderate modification of tasks or assist with assess necessary to complete eval    OT Frequency  2x / week    OT Duration  12 weeks    OT Treatment/Interventions  Moist Heat;Self-care/ADL training;Therapeutic exercise;Patient/family education;Energy conservation;Therapist, nutritional;Therapeutic activities;Balance training;DME and/or AE instruction;Manual Therapy;Psychosocial skills training    Plan  Rec continued OT 2x per week for 12 weeks    OT Home Exercise Plan  Pt educated in use of ice at home using Dixie cups.    Consulted and Agree with Plan of Care  Patient       Patient will benefit from skilled therapeutic intervention in order to improve the following deficits and impairments:   Body Structure / Function / Physical Skills: Strength, Gait, FMC, IADL, Balance, ADL, Coordination, Endurance, UE functional use,  Mobility       Visit Diagnosis: Muscle weakness (generalized)  Other lack of coordination    Problem List Patient Active Problem List   Diagnosis Date Noted  . Nasal mass 01/30/2019  . Vitamin D deficiency 10/29/2018  . Migraine without status migrainosus, not intractable   . Chronic pain syndrome   . Lumbar disc disease with radiculopathy 07/21/2017  . Lumbar foraminal stenosis 07/21/2017  . Unable to walk 07/21/2017  . Multiple falls 07/21/2017  . Displaced fracture of proximal end of right fibula 07/21/2017  . Lower extremity weakness 05/28/2017  . Right lower  quadrant abdominal pain 08/22/2016  . Abnormal TSH 01/10/2016  . Morbid obesity ([place]) 01/12/2014  . Routine general medical examination at a health care facility 10/28/2012  . GERD (gastroesophageal reflux disease) 10/28/2012  . Anxiety 12/20/2011  . Allergic rhinitis 12/20/2011  . Asthma 12/20/2011  . Lapband APL with HH repair 03/31/2011  . Uncontrolled type 2 diabetes mellitus with hyperglycemia, with long-term current use of insulin ([place]) 06/30/2007  . Hyperlipidemia 06/30/2007  . Essential hypertension 06/27/2007    Harrel Carina, MS, OTR/L 02/17/2019, 1:24 PM  [place] MAIN [facility] SERVICES [address] [place], [place], [address] Phone: [phone]   Fax:  [phone]  Name: LAKLYN RAYNARD MRN: [id number] Date of Birth: [birth year]

## 2019-02-19 ENCOUNTER — Ambulatory Visit: Payer: BC Managed Care – PPO | Admitting: Physical Therapy

## 2019-02-19 ENCOUNTER — Ambulatory Visit: Payer: BC Managed Care – PPO | Admitting: Occupational Therapy

## 2019-02-21 ENCOUNTER — Encounter: Payer: Self-pay | Admitting: *Deleted

## 2019-02-21 ENCOUNTER — Telehealth: Payer: Self-pay | Admitting: Primary Care

## 2019-02-21 NOTE — Telephone Encounter (Signed)
Letter received from [facility] requesting new and better fitting orthotics due to volume fluctuations. She is needing AFO to the right specifically as her current AFO is too tight.   I agree to the right AFO as [place] has undergone significant volumetric change to her lower extremities that is not expected to improve. She continues to suffer from bilateral foot drop and chronic falls, and was recently diagnosed with ALS. She requires a metal double upright AFO for her right side to better accommodate for volume fluctuations and to address the physical weakness and deformities of the lower extremity.   Please send in a copy of this note along with the order form.

## 2019-02-24 ENCOUNTER — Encounter: Payer: Self-pay | Admitting: Physical Therapy

## 2019-02-24 ENCOUNTER — Ambulatory Visit: Payer: BC Managed Care – PPO | Admitting: Occupational Therapy

## 2019-02-24 ENCOUNTER — Encounter: Payer: Self-pay | Admitting: Occupational Therapy

## 2019-02-24 ENCOUNTER — Other Ambulatory Visit: Payer: Self-pay

## 2019-02-24 ENCOUNTER — Ambulatory Visit: Payer: BC Managed Care – PPO | Attending: Primary Care | Admitting: Physical Therapy

## 2019-02-24 DIAGNOSIS — R278 Other lack of coordination: Secondary | ICD-10-CM | POA: Insufficient documentation

## 2019-02-24 DIAGNOSIS — M6281 Muscle weakness (generalized): Secondary | ICD-10-CM | POA: Diagnosis present

## 2019-02-24 DIAGNOSIS — M25571 Pain in right ankle and joints of right foot: Secondary | ICD-10-CM | POA: Diagnosis present

## 2019-02-24 DIAGNOSIS — R262 Difficulty in walking, not elsewhere classified: Secondary | ICD-10-CM | POA: Insufficient documentation

## 2019-02-24 DIAGNOSIS — M25551 Pain in right hip: Secondary | ICD-10-CM | POA: Insufficient documentation

## 2019-02-24 DIAGNOSIS — R29898 Other symptoms and signs involving the musculoskeletal system: Secondary | ICD-10-CM | POA: Diagnosis present

## 2019-02-24 NOTE — Telephone Encounter (Signed)
Faxed letter and order form as instructed.

## 2019-02-24 NOTE — Therapy (Signed)
[place] MAIN [facility] SERVICES [address] [place], [place], [address] Phone: [phone]   Fax:  [phone]  Physical Therapy Treatment  Patient Details  Name: [name] MRN: [id number] Date of Birth: 1965-11-02 Referring Provider (PT): Cornell Barman   Encounter Date: 02/24/2019  PT End of Session - 02/24/19 1314    Visit Number  43    Number of Visits  57    Date for PT Re-Evaluation  03/25/19    PT Start Time  1100    PT Stop Time  1145    PT Time Calculation (min)  45 min    Equipment Utilized During Treatment  Gait belt    Activity Tolerance  Patient tolerated treatment well    Behavior During Therapy  WFL for tasks assessed/performed       Past Medical History:  Diagnosis Date  . Allergy   . Amyotrophic lateral sclerosis (ALS) ([place]) 09/23/2018  . Anemia   . Anxiety   . Arthritis   . Asthma   . Colon polyp   . Complication of anesthesia   . Diabetes mellitus   . Edema   . Fatigue   . Hypertension   . IBS (irritable bowel syndrome)   . Lump in female breast   . Methicillin resistant Staphylococcus aureus in conditions classified elsewhere and of unspecified site   . Migraines   . Morbid obesity ([place])   . Night sweats   . Nonspecific abnormal results of thyroid function study   . Other B-complex deficiencies   . Paresthesias   . PONV (postoperative nausea and vomiting)   . Pure hypercholesterolemia   . Reflux   . Sacral fracture ([place])   . Sinus problem   . Symptomatic states associated with artificial menopause   . Thyroid disease   . Type II or unspecified type diabetes mellitus without mention of complication, not stated as uncontrolled   . Unspecified sleep apnea     Past Surgical History:  Procedure Laterality Date  . abcess removal  1997   MRSA  . ABDOMINAL HYSTERECTOMY  1997   complete in 1997, partial was in 1995  . carpal tunnel release r  07/03/13   Dalldorf  . [place]  . CHOLECYSTECTOMY  03/1989  . COLONOSCOPY  03/21/2011   Normal.  [facility].  [name] KNEE SURGERY  2006  . LAPAROSCOPIC GASTRIC BANDING  05/2009  . TONSILLECTOMY AND ADENOIDECTOMY  1974  . TUBAL LIGATION    . ULNAR TUNNEL RELEASE Right 02/05/2018   Procedure: CUBITAL TUNNEL RELEASE/DECOMPRESSION;  Surgeon: Melrose Nakayama, MD;  Location: [facility];  Service: Orthopedics;  Laterality: Right;    There were no vitals filed for this visit.  Subjective Assessment - 02/24/19 1307    Subjective  Patient reports continued RUE elbow discomfort. She reports no new falls. Reports otherwise doing well.    Patient is accompained by:  Family member    Pertinent History   Patient fx her sacrum 9/18 and  she lost sensation to right foot, and she lost ability to DF her right foot. Patient had back surgery 03/2017 , she was sent home. She was walking with no device indoors and she could get out of the house and do the steps. June 18, 2017  her LLE was getting weaker and the right leg was getting stronger. Patient had follow up with neurosurgery. She was scheduled for a steriod shot Jul 23, 2017, but  on May 3 her legs didnt  feel right, she fell 4 times, she fractured her R fibula  on may 3rd, she went to hospital and spent one week. She went to SNF for 1 month. She had another neuro consult, resulting in kDx of  polyneuropathy. Then she had an apt with and [facility] said it was not polyneuropathy, (in [place])  She has a second consultation scheduled Dec 30 for a  Neuromuscular.consult.  She had a scan of the spine T 12-28-17  who then sent her to a thoracic surgeon at the end of November and was recommended to not due surgery. She does not know what her Dx is and she has been having home PT beginning August 25, 2017 . She has had HHPT 2 x  week for the last 5 months. She is able to walk 50 feet with RW. She is able to transfer from wc to stand independently.     How long can you stand comfortably?  5 mins    Patient Stated Goals   to walk without the RW, or walk better, use the bathroom    Currently in Pain?  Yes    Pain Score  4     Pain Location  Elbow    Pain Orientation  Right    Pain Descriptors / Indicators  Aching    Pain Onset  In the past 7 days    Aggravating Factors   full elbow extension    Pain Relieving Factors  stretch and heat    Effect of Pain on Daily Activities  decreased activity tolerance    Multiple Pain Sites  No       Treatment: Nu-step x 5 mins Standing with RW in front and minimal UE use: Getting balls to the side and throwing them to opposite side with minimal UE use x 3 reps BUE Sorting balls with 4 levels and on UE assist for balance Transfer to commode <>wc x 1 with SBA and RW Patient performed with instruction, verbal cues, tactile cues of therapist: goal: increase tissue extensibility, promote proper posture, improve mobility                        PT Education - 02/24/19 1310    Education provided  Yes    Education Details  HEP    Person(s) Educated  Patient    Methods  Explanation;Demonstration;Tactile cues;Verbal cues    Comprehension  Verbalized understanding;Returned demonstration;Verbal cues required;Tactile cues required       PT Short Term Goals - 10/23/18 1159      PT SHORT TERM GOAL #1   Title  Patient will be independent in home exercise program to improve strength/mobility for better functional independence with ADLs.    Time  6    Period  Weeks    Status  Partially Met    Target Date  09/10/18        PT Long Term Goals - 02/05/19 1158      PT LONG TERM GOAL #1   Title  Patient will increase 10 meter walk test to >.50 m/s as to improve gait speed for better community ambulation and to reduce fall risk.    Baseline  . 36 m/sec,, 10/23/18 =.43 m/ sec, 02/05/19=.49 m/sec    Time  8    Period  Weeks    Status  Partially Met    Target Date  03/25/19      PT LONG TERM  GOAL #2   Title  Patient will transfer sit to stand  from 19 inch  mat without assist or anyone supporting AD  to demonstrate improved LE strength to decrease falls risk.     Baseline  02/05/19=    Time  12    Period  Weeks    Status  Achieved      PT LONG TERM GOAL #3   Title  Patient will demonstrate stance without AD  x 1 min to demonstrate decreased falls risk.     Baseline  02/05/19=She is able to stand for 20 seconds without UE support and 1 min with 1 UE support    Time  12    Period  Weeks    Status  Achieved    Target Date  03/25/19      PT LONG TERM GOAL #4   Title  Patient will  ambulate 500 feet to work towards community ambulation distances.     Baseline  270 feet, 10/23/18= 305 feet, 02/05/19 = 300 feet    Time  12    Period  Weeks    Status  Partially Met    Target Date  03/25/19      PT LONG TERM GOAL #5   Title  Patient (< 41 years old) will complete five times sit to stand test in < 10 seconds indicating an increased LE strength and improved balance.    Baseline  20.28 m/sec. 10/23/18 =16.82 ( from green chair)02/05/19=,12.85 sec    Time  12    Period  Weeks    Status  Partially Met    Target Date  03/25/19      PT LONG TERM GOAL #6   Title  Patient will report a worst pain of 3/10 on VAS in left knee             to improve tolerance with ADLs and reduced symptoms with activities.    Baseline  No pain in knees 02/05/19    Time  12    Period  Weeks    Status  Achieved    Target Date  03/25/19            Plan - 02/24/19 1321    Clinical Impression Statement  Patient instructed in intermediate mobility challenges, transfer training and safety training. Patient required min-mod VCs for correct positioning; Patient had increased difficulty with dynamic balance challenges and dule task movements especially in standing.  Patient would benefit from additional skilled PT intervention to improve strength, balance.    Rehab Potential  Good    PT Frequency  2x / week    PT Duration  12 weeks    PT Treatment/Interventions  Gait  training;Therapeutic activities;Therapeutic exercise;Balance training;Neuromuscular re-education;Patient/family education;Orthotic Fit/Training;Manual techniques    PT Next Visit Plan  Continue with current program.    PT Home Exercise Plan  No updates this date     Consulted and Agree with Plan of Care  Patient    Family Member Consulted  Spouse        Patient will benefit from skilled therapeutic intervention in order to improve the following deficits and impairments:  Decreased balance, Decreased endurance, Decreased mobility, Difficulty walking, Impaired sensation, Decreased range of motion, Decreased activity tolerance, Decreased coordination, Decreased strength, Pain, Postural dysfunction  Visit Diagnosis: Muscle weakness (generalized)  Other lack of coordination  Pain in right ankle and joints of right foot  Weakness of right leg  Difficulty in walking, not elsewhere  classified  Pain in right hip     Problem List Patient Active Problem List   Diagnosis Date Noted  . Nasal mass 01/30/2019  . Vitamin D deficiency 10/29/2018  . Migraine without status migrainosus, not intractable   . Chronic pain syndrome   . Lumbar disc disease with radiculopathy 07/21/2017  . Lumbar foraminal stenosis 07/21/2017  . Unable to walk 07/21/2017  . Multiple falls 07/21/2017  . Displaced fracture of proximal end of right fibula 07/21/2017  . Lower extremity weakness 05/28/2017  . Right lower quadrant abdominal pain 08/22/2016  . Abnormal TSH 01/10/2016  . Morbid obesity ([place]) 01/12/2014  . Routine general medical examination at a health care facility 10/28/2012  . GERD (gastroesophageal reflux disease) 10/28/2012  . Anxiety 12/20/2011  . Allergic rhinitis 12/20/2011  . Asthma 12/20/2011  . Lapband APL with HH repair 03/31/2011  . Uncontrolled type 2 diabetes mellitus with hyperglycemia, with long-term current use of insulin ([place]) 06/30/2007  . Hyperlipidemia 06/30/2007  . Essential  hypertension 06/27/2007    Alanson Puls, PT DPT 02/24/2019, 1:22 PM  [place] MAIN [facility] SERVICES [address] [place], [place], [address] Phone: [phone]   Fax:  [phone]  Name: [name] MRN: [id number] Date of Birth: March 01, 1966

## 2019-02-24 NOTE — Therapy (Signed)
[place] MAIN [facility] SERVICES [address] [place], [place], [address] Phone: [phone]   Fax:  [phone]  Occupational Therapy Treatment  Patient Details  Name: [name] MRN: [id number] Date of Birth: Aug 16, 1965 Referring Provider (OT): Alma Friendly, MD   Encounter Date: 02/24/2019  OT End of Session - 02/24/19 1637    Visit Number  29    Number of Visits  75    Date for OT Re-Evaluation  03/05/19    Authorization Type  Progress report period starting 11/18/18    OT Start Time  1145    OT Stop Time  1230    OT Time Calculation (min)  45 min    Activity Tolerance  Patient tolerated treatment well;Patient limited by pain    Behavior During Therapy  [facility] for tasks assessed/performed       Past Medical History:  Diagnosis Date  . Allergy   . Amyotrophic lateral sclerosis (ALS) ([place]) 09/23/2018  . Anemia   . Anxiety   . Arthritis   . Asthma   . Colon polyp   . Complication of anesthesia   . Diabetes mellitus   . Edema   . Fatigue   . Hypertension   . IBS (irritable bowel syndrome)   . Lump in female breast   . Methicillin resistant Staphylococcus aureus in conditions classified elsewhere and of unspecified site   . Migraines   . Morbid obesity ([place])   . Night sweats   . Nonspecific abnormal results of thyroid function study   . Other B-complex deficiencies   . Paresthesias   . PONV (postoperative nausea and vomiting)   . Pure hypercholesterolemia   . Reflux   . Sacral fracture ([place])   . Sinus problem   . Symptomatic states associated with artificial menopause   . Thyroid disease   . Type II or unspecified type diabetes mellitus without mention of complication, not stated as uncontrolled   . Unspecified sleep apnea     Past Surgical History:  Procedure Laterality Date  . abcess removal  1997   MRSA  . ABDOMINAL HYSTERECTOMY  1997   complete in 1997, partial was in 1995  . carpal tunnel release r  07/03/13    Dalldorf  . [place]  . CHOLECYSTECTOMY  03/1989  . COLONOSCOPY  03/21/2011   Normal.  [facility].  [name] KNEE SURGERY  2006  . LAPAROSCOPIC GASTRIC BANDING  05/2009  . TONSILLECTOMY AND ADENOIDECTOMY  1974  . TUBAL LIGATION    . ULNAR TUNNEL RELEASE Right 02/05/2018   Procedure: CUBITAL TUNNEL RELEASE/DECOMPRESSION;  Surgeon: Melrose Nakayama, MD;  Location: [facility];  Service: Orthopedics;  Laterality: Right;    There were no vitals filed for this visit.  Subjective Assessment - 02/24/19 1635    Subjective   Pt. 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    Patient is accompanied by:  Family member    Pertinent History  [name] was recently diagnosed with ALS at [facility].  She has had a recent decrease in strength in L side of body, UE and LE with decreased coordination.  She feels like she is dropping cups more and needs to use both hands for hand to mouth and handwriting has decreased to almost illegible.    Currently in Pain?  Yes    Pain Score  4     Pain Location  Elbow    Pain Orientation  Right    Pain Descriptors / Indicators  Aching       OT TREATMENT    Pt. Tolerated manual techniques with cross friction massage . Manual techniques were performed prior to, and independent of therapeutic Ex. Pt. Performed ROM for elbow flexion, extension, forearm supination, and wrist extension. Pt. Education was provided about work simplification techniaues, and energy conservation, at task modification during tasks at home. Pt. education was provided about positioning, icing, and heat at home, as well as resting her RUE, and avoiding lifting, pulling,or pushing with resistance.  Response to Treatment:  Pt. reports that the pain had improved in her right elbow, and forearm from last week, however she started having pain again after wrapping all of her daughter's presents so that she could go away for the weekend.  Pt. is respponding well to moist heat pack modiality, and gentle manual techniques/massage, and ROM. Pt. has a follow-up appointment at the [place] [facility] at [facility]. Strategies were reviewed with the pt. about  work simplification techniques, and energy conservation with balancing rest with activity when completing tasks, as well as positioning.                       OT Education - 02/24/19 1637    Education Details  Positioning, UE care    Person(s) Educated  Patient    Methods  Explanation;Demonstration;Verbal cues    Comprehension  Verbalized understanding;Returned demonstration          OT Long Term Goals - 01/08/19 1100      OT LONG TERM GOAL #1   Title  Pt will complete self feeding using adaptive utensils and compenstory techniques using R hand.    Baseline  Pt needs help with holding cup, cutting meat and uses L hand as assist.    Time  12    Period  Weeks    Status  Achieved      OT LONG TERM GOAL #2   Title  Pt will complete LB dressing using adaptive equipment with min assist and cues in unsupported sitting.    Baseline  Now able to do her socks, pants but needs help with AFOs and shoes 12/11/18    Time  12    Period  Weeks    Status  On-going    Target Date  03/05/19      OT LONG TERM GOAL #3   Title  Pt will complete toileting and hygiene with adaptive aid with min assist and cues on elevated toilet with use of grab bar and FWW for balance.    Baseline  improving but still requires occasional assist    Time  12    Period  Weeks    Status  On-going    Target Date  03/05/19      OT LONG TERM GOAL #4   Title  Pt will be educated in HEP for strengthening and fine motor coordination.    Baseline  ongoing additions to home program    Time  12    Period  Weeks    Status  On-going    Target Date  03/05/19      OT LONG TERM GOAL #5   Title  Pt will increase strength  in B hands for use during ADLs by 3#    Baseline  exceeded  initial goal, revised to 3 additional #    Time  12  Period  Weeks    Status  On-going    Target Date  03/05/19      OT LONG TERM GOAL #6   Title  Pt will increase strength 2# in B pinch for lateral and 3 point for use during ADLs.    Baseline  minimal changes in pinch 12/11/2018    Time  12    Period  Weeks    Status  Achieved      OT LONG TERM GOAL #7   Title  Patient will demonstrate improved fine motor coordination skills to minimize dropping items such as pills    Baseline  drops items frequently    Time  12    Period  Weeks    Status  New    Target Date  03/05/19      OT LONG TERM GOAL #8   Title  Patient to demonstrate ability to manipulate buttons with or without use of adaptive equipment.    Baseline  unable to perform buttons and requires assist.    Time  12    Period  Weeks    Status  New    Target Date  03/05/19            Plan - 02/24/19 1637    Clinical Impression Statement Pt. reports that the pain had improved in her right elbow, and forearm from last week, however she started having pain again after wrapping all of her daughter's presents so that she could go away for the weekend. Pt. is respponding well to moist heat pack modiality, and gentle manual techniques/massage, and ROM. Pt. has a follow-up appointment at the [place] [facility] at [facility]. Strategies were reviewed with the pt. about  work simplification techniques, and energy conservation with balancing rest with activity when completing tasks, as well as positioning.    OT Occupational Profile and History  Detailed Assessment- Review of Records and additional review of physical, cognitive, psychosocial history related to current functional performance    Occupational performance deficits (Please refer to evaluation for details):  ADL's;IADL's;Education;Leisure    Body Structure / Function / Physical Skills  Strength;Gait;FMC;IADL;Balance;ADL;Coordination;Endurance;UE functional  use;Mobility    Rehab Potential  Good    Clinical Decision Making  Several treatment options, min-mod task modification necessary    Comorbidities Affecting Occupational Performance:  Presence of comorbidities impacting occupational performance    Comorbidities impacting occupational performance description:  progressive muscle weakness, balance, coordination    Modification or Assistance to Complete Evaluation   Min-Moderate modification of tasks or assist with assess necessary to complete eval    OT Frequency  2x / week    OT Duration  12 weeks    OT Treatment/Interventions  Moist Heat;Self-care/ADL training;Therapeutic exercise;Patient/family education;Energy conservation;Therapist, nutritional;Therapeutic activities;Balance training;DME and/or AE instruction;Manual Therapy;Psychosocial skills training    Plan  Rec continued OT 2x per week for 12 weeks    Consulted and Agree with Plan of Care  Patient       Patient will benefit from skilled therapeutic intervention in order to improve the following deficits and impairments:   Body Structure / Function / Physical Skills: Strength, Gait, FMC, IADL, Balance, ADL, Coordination, Endurance, UE functional use, Mobility       Visit Diagnosis: Muscle weakness (generalized)  Other lack of coordination    Problem List Patient Active Problem List   Diagnosis Date Noted  . Nasal mass 01/30/2019  . Vitamin D deficiency 10/29/2018  . Migraine without status migrainosus, not intractable   .  Chronic pain syndrome   . Lumbar disc disease with radiculopathy 07/21/2017  . Lumbar foraminal stenosis 07/21/2017  . Unable to walk 07/21/2017  . Multiple falls 07/21/2017  . Displaced fracture of proximal end of right fibula 07/21/2017  . Lower extremity weakness 05/28/2017  . Right lower quadrant abdominal pain 08/22/2016  . Abnormal TSH 01/10/2016  . Morbid obesity ([place]) 01/12/2014  . Routine general medical examination at a health care  facility 10/28/2012  . GERD (gastroesophageal reflux disease) 10/28/2012  . Anxiety 12/20/2011  . Allergic rhinitis 12/20/2011  . Asthma 12/20/2011  . Lapband APL with HH repair 03/31/2011  . Uncontrolled type 2 diabetes mellitus with hyperglycemia, with long-term current use of insulin ([place]) 06/30/2007  . Hyperlipidemia 06/30/2007  . Essential hypertension 06/27/2007    Harrel Carina, MS, OTR/L 02/24/2019, 5:35 PM  [place] MAIN [facility] SERVICES [address] [place], [place], [address] Phone: [phone]   Fax:  [phone]  Name: TEFFANY OBANDO MRN: [id number] Date of Birth: [birth year]

## 2019-02-25 ENCOUNTER — Other Ambulatory Visit: Payer: Self-pay | Admitting: Primary Care

## 2019-02-25 DIAGNOSIS — J3489 Other specified disorders of nose and nasal sinuses: Secondary | ICD-10-CM

## 2019-02-26 ENCOUNTER — Ambulatory Visit: Payer: BC Managed Care – PPO | Admitting: Occupational Therapy

## 2019-02-26 ENCOUNTER — Ambulatory Visit: Payer: BC Managed Care – PPO | Admitting: Physical Therapy

## 2019-03-03 ENCOUNTER — Ambulatory Visit: Payer: BC Managed Care – PPO | Admitting: Physical Therapy

## 2019-03-03 ENCOUNTER — Encounter: Payer: Self-pay | Admitting: Occupational Therapy

## 2019-03-03 ENCOUNTER — Other Ambulatory Visit: Payer: Self-pay

## 2019-03-03 ENCOUNTER — Ambulatory Visit: Payer: BC Managed Care – PPO | Admitting: Occupational Therapy

## 2019-03-03 ENCOUNTER — Encounter: Payer: Self-pay | Admitting: Physical Therapy

## 2019-03-03 DIAGNOSIS — R278 Other lack of coordination: Secondary | ICD-10-CM

## 2019-03-03 DIAGNOSIS — M6281 Muscle weakness (generalized): Secondary | ICD-10-CM

## 2019-03-03 DIAGNOSIS — M25551 Pain in right hip: Secondary | ICD-10-CM

## 2019-03-03 DIAGNOSIS — M25571 Pain in right ankle and joints of right foot: Secondary | ICD-10-CM

## 2019-03-03 DIAGNOSIS — R262 Difficulty in walking, not elsewhere classified: Secondary | ICD-10-CM

## 2019-03-03 DIAGNOSIS — R29898 Other symptoms and signs involving the musculoskeletal system: Secondary | ICD-10-CM

## 2019-03-03 NOTE — Therapy (Signed)
[place] MAIN [facility] SERVICES [address] [place], [place], [address] Phone: [phone]   Fax:  [phone]  Physical Therapy Treatment  Patient Details  Name: [name] MRN: [id number] Date of Birth: [birth year] Referring Provider (PT): Cornell Barman   Encounter Date: 03/03/2019  PT End of Session - 03/03/19 1138    Visit Number  44    Number of Visits  57    Date for PT Re-Evaluation  03/25/19    PT Start Time  1100    PT Stop Time  1145    PT Time Calculation (min)  45 min    Equipment Utilized During Treatment  Gait belt    Activity Tolerance  Patient tolerated treatment well    Behavior During Therapy  WFL for tasks assessed/performed       Past Medical History:  Diagnosis Date  . Allergy   . Amyotrophic lateral sclerosis (ALS) ([place]) 09/23/2018  . Anemia   . Anxiety   . Arthritis   . Asthma   . Colon polyp   . Complication of anesthesia   . Diabetes mellitus   . Edema   . Fatigue   . Hypertension   . IBS (irritable bowel syndrome)   . Lump in female breast   . Methicillin resistant Staphylococcus aureus in conditions classified elsewhere and of unspecified site   . Migraines   . Morbid obesity ([place])   . Night sweats   . Nonspecific abnormal results of thyroid function study   . Other B-complex deficiencies   . Paresthesias   . PONV (postoperative nausea and vomiting)   . Pure hypercholesterolemia   . Reflux   . Sacral fracture ([place])   . Sinus problem   . Symptomatic states associated with artificial menopause   . Thyroid disease   . Type II or unspecified type diabetes mellitus without mention of complication, not stated as uncontrolled   . Unspecified sleep apnea     Past Surgical History:  Procedure Laterality Date  . abcess removal  1997   MRSA  . ABDOMINAL HYSTERECTOMY  1997   complete in 1997, partial was in 1995  . carpal tunnel release r  07/03/13   Dalldorf  . [place]  . CHOLECYSTECTOMY  03/1989  . COLONOSCOPY  03/21/2011   Normal.  [facility].  [name] KNEE SURGERY  2006  . LAPAROSCOPIC GASTRIC BANDING  05/2009  . TONSILLECTOMY AND ADENOIDECTOMY  1974  . TUBAL LIGATION    . ULNAR TUNNEL RELEASE Right 02/05/2018   Procedure: CUBITAL TUNNEL RELEASE/DECOMPRESSION;  Surgeon: Melrose Nakayama, MD;  Location: [place];  Service: Orthopedics;  Laterality: Right;    There were no vitals filed for this visit.  Subjective Assessment - 03/03/19 1108    Subjective  Patient reports continued RUE elbow discomfort. She reports no new falls. Reports otherwise doing well.    Patient is accompained by:  Family member    Pertinent History   Patient fx her sacrum 9/18 and  she lost sensation to right foot, and she lost ability to DF her right foot. Patient had back surgery 03/2017 , she was sent home. She was walking with no device indoors and she could get out of the house and do the steps. June 18, 2017  her LLE was getting weaker and the right leg was getting stronger. Patient had follow up with neurosurgery. She was scheduled for a steriod shot Jul 23, 2017, but  on May 3 her legs didnt  feel right, she fell 4 times, she fractured her R fibula  on may 3rd, she went to hospital and spent one week. She went to SNF for 1 month. She had another neuro consult, resulting in kDx of  polyneuropathy. Then she had an apt with and [facility] said it was not polyneuropathy, (in [place])  She has a second consultation scheduled Dec 30 for a  Neuromuscular.consult.  She had a scan of the spine T 12-28-17  who then sent her to a thoracic surgeon at the end of November and was recommended to not due surgery. She does not know what her Dx is and she has been having home PT beginning August 25, 2017 . She has had HHPT 2 x  week for the last 5 months. She is able to walk 50 feet with RW. She is able to transfer from wc to stand independently.     How long can you stand comfortably?  5 mins    Patient Stated Goals   to walk without the RW, or walk better, use the bathroom    Currently in Pain?  No/denies    Pain Score  0-No pain    Pain Onset  In the past 7 days       Nu-step L 5 x 3 mins , L 3 x 2 mins,,seat at 10 Side stepping in parallel bars RTB x 4 laps (20 feet each) Standing hip abd RTB x 15 BLE x 2 sets Standing hip ext  RTB x 20 BLE x 2 sets   Patient performed with instruction, verbal cues, tactile cues of therapist: goal:increase tissue extensibility, promote proper posture, improve mobility                        PT Education - 03/03/19 1108    Education provided  Yes    Education Details  HEP    Person(s) Educated  Patient    Methods  Explanation;Demonstration;Verbal cues;Tactile cues    Comprehension  Verbalized understanding;Verbal cues required;Tactile cues required;Returned demonstration       PT Short Term Goals - 10/23/18 1159      PT SHORT TERM GOAL #1   Title  Patient will be independent in home exercise program to improve strength/mobility for better functional independence with ADLs.    Time  6    Period  Weeks    Status  Partially Met    Target Date  09/10/18        PT Long Term Goals - 02/05/19 1158      PT LONG TERM GOAL #1   Title  Patient will increase 10 meter walk test to >.50 m/s as to improve gait speed for better community ambulation and to reduce fall risk.    Baseline  . 36 m/sec,, 10/23/18 =.43 m/ sec, 02/05/19=.49 m/sec    Time  8    Period  Weeks    Status  Partially Met    Target Date  03/25/19      PT LONG TERM GOAL #2   Title  Patient will transfer sit to stand  from 19 inch mat without assist or anyone supporting AD  to demonstrate improved LE strength to decrease falls risk.     Baseline  02/05/19=    Time  12    Period  Weeks    Status  Achieved      PT LONG TERM GOAL #3  Title  Patient will demonstrate stance without AD  x 1 min to demonstrate decreased falls risk.     Baseline  02/05/19=She is able to stand  for 20 seconds without UE support and 1 min with 1 UE support    Time  12    Period  Weeks    Status  Achieved    Target Date  03/25/19      PT LONG TERM GOAL #4   Title  Patient will  ambulate 500 feet to work towards community ambulation distances.     Baseline  270 feet, 10/23/18= 305 feet, 02/05/19 = 300 feet    Time  12    Period  Weeks    Status  Partially Met    Target Date  03/25/19      PT LONG TERM GOAL #5   Title  Patient (< 68 years old) will complete five times sit to stand test in < 10 seconds indicating an increased LE strength and improved balance.    Baseline  20.28 m/sec. 10/23/18 =16.82 ( from green chair)02/05/19=,12.85 sec    Time  12    Period  Weeks    Status  Partially Met    Target Date  03/25/19      PT LONG TERM GOAL #6   Title  Patient will report a worst pain of 3/10 on VAS in left knee             to improve tolerance with ADLs and reduced symptoms with activities.    Baseline  No pain in knees 02/05/19    Time  12    Period  Weeks    Status  Achieved    Target Date  03/25/19            Plan - 03/03/19 1139    Clinical Impression Statement  Patient instructed in intermediate LE strengthening. Patient required min VCS to improve weight shift and for correct  posture for better stance control. Patient would benefit from additional skilled PT intervention to improve strength, balance and gait safety.    Rehab Potential  Good    PT Frequency  2x / week    PT Duration  12 weeks    PT Treatment/Interventions  Gait training;Therapeutic activities;Therapeutic exercise;Balance training;Neuromuscular re-education;Patient/family education;Orthotic Fit/Training;Manual techniques    PT Next Visit Plan  Continue with current program.    PT Home Exercise Plan  No updates this date     Consulted and Agree with Plan of Care  Patient    Family Member Consulted  Spouse        Patient will benefit from skilled therapeutic intervention in order to improve the  following deficits and impairments:  Decreased balance, Decreased endurance, Decreased mobility, Difficulty walking, Impaired sensation, Decreased range of motion, Decreased activity tolerance, Decreased coordination, Decreased strength, Pain, Postural dysfunction  Visit Diagnosis: Muscle weakness (generalized)  Other lack of coordination  Pain in right ankle and joints of right foot  Weakness of right leg  Difficulty in walking, not elsewhere classified  Pain in right hip     Problem List Patient Active Problem List   Diagnosis Date Noted  . Nasal mass 01/30/2019  . Vitamin D deficiency 10/29/2018  . Migraine without status migrainosus, not intractable   . Chronic pain syndrome   . Lumbar disc disease with radiculopathy 07/21/2017  . Lumbar foraminal stenosis 07/21/2017  . Unable to walk 07/21/2017  . Multiple falls 07/21/2017  . Displaced fracture of proximal  end of right fibula 07/21/2017  . Lower extremity weakness 05/28/2017  . Right lower quadrant abdominal pain 08/22/2016  . Abnormal TSH 01/10/2016  . Morbid obesity ([place]) 01/12/2014  . Routine general medical examination at a health care facility 10/28/2012  . GERD (gastroesophageal reflux disease) 10/28/2012  . Anxiety 12/20/2011  . Allergic rhinitis 12/20/2011  . Asthma 12/20/2011  . Lapband APL with HH repair 03/31/2011  . Uncontrolled type 2 diabetes mellitus with hyperglycemia, with long-term current use of insulin ([place]) 06/30/2007  . Hyperlipidemia 06/30/2007  . Essential hypertension 06/27/2007    Alanson Puls, PT DPT 03/03/2019, 11:40 AM  [place] MAIN [facility] SERVICES [address] [place], [place], [address] Phone: [phone]   Fax:  [phone]  Name: [name] MRN: [id number] Date of Birth: [birth year]

## 2019-03-03 NOTE — Therapy (Signed)
[place] MAIN [facility] SERVICES [address] [place], [place], [address] Phone: [phone]   Fax:  [phone]  Occupational Therapy Progress Note  Dates of reporting period  11/18/2018   to   03/03/2019  Patient Details  Name: [name] MRN: [id number] Date of Birth: 1965/04/22 Referring Provider (OT): Alma Friendly, MD   Encounter Date: 03/03/2019  OT End of Session - 03/03/19 1152    Visit Number  30    Number of Visits  26    Date for OT Re-Evaluation  03/05/19    Authorization Type  Progress report period starting 11/18/18    Authorization Time Period  --    OT Start Time  1145    OT Stop Time  1230    OT Time Calculation (min)  45 min    Activity Tolerance  Patient tolerated treatment well;Patient limited by pain    Behavior During Therapy  [facility] for tasks assessed/performed       Past Medical History:  Diagnosis Date  . Allergy   . Amyotrophic lateral sclerosis (ALS) ([place]) 09/23/2018  . Anemia   . Anxiety   . Arthritis   . Asthma   . Colon polyp   . Complication of anesthesia   . Diabetes mellitus   . Edema   . Fatigue   . Hypertension   . IBS (irritable bowel syndrome)   . Lump in female breast   . Methicillin resistant Staphylococcus aureus in conditions classified elsewhere and of unspecified site   . Migraines   . Morbid obesity ([place])   . Night sweats   . Nonspecific abnormal results of thyroid function study   . Other B-complex deficiencies   . Paresthesias   . PONV (postoperative nausea and vomiting)   . Pure hypercholesterolemia   . Reflux   . Sacral fracture ([place])   . Sinus problem   . Symptomatic states associated with artificial menopause   . Thyroid disease   . Type II or unspecified type diabetes mellitus without mention of complication, not stated as uncontrolled   . Unspecified sleep apnea     Past Surgical History:  Procedure Laterality Date  . abcess removal  1997   MRSA  . ABDOMINAL  HYSTERECTOMY  1997   complete in 1997, partial was in 1995  . carpal tunnel release r  07/03/13   Dalldorf  . [place]  . CHOLECYSTECTOMY  03/1989  . COLONOSCOPY  03/21/2011   Normal.  [facility].  [name] KNEE SURGERY  2006  . LAPAROSCOPIC GASTRIC BANDING  05/2009  . TONSILLECTOMY AND ADENOIDECTOMY  1974  . TUBAL LIGATION    . ULNAR TUNNEL RELEASE Right 02/05/2018   Procedure: CUBITAL TUNNEL RELEASE/DECOMPRESSION;  Surgeon: Melrose Nakayama, MD;  Location: [place];  Service: Orthopedics;  Laterality: Right;    There were no vitals filed for this visit.  Subjective Assessment - 03/03/19 1150    Subjective   Pt. reports that she took two days to assemble packages  for a church [place] function.    Patient is accompanied by:  Family member    Pertinent History  [name] was recently diagnosed with ALS at [place] [facility].  She has had a recent decrease in strength in L side of body, UE and LE with decreased coordination.  She feels like she is dropping cups more and needs to use both hands for hand to mouth and handwriting has decreased to  almost illegible.    Patient Stated Goals  I want to work on handwriting, being able to hold a cup again and regain strength and coordination in both hands.    Currently in Pain?  No/denies      OT TREATMENT    Neuro muscular re-education:  Pt. worked on grasping, flipping and stacking 2" large pegs on the Instructo board placed at a tabletop surface. Pt. Worked on following a Insurance risk surveyor. Pt. worked on using her right hand to grasp, store, and work on translatory movements of the right hand to remove the pegs, and placing them back into the container. Pt. performed right hand [facility] tasks using the Grooved pegboard. Pt. worked on grasping the grooved pegs from a horizontal position, and moving the pegs to a vertical position in the hand to prepare for placing them in the grooved slot.   Response to Treatment  Pt.  reports that her right arm is much better after taking Napraxen perscribed by her physician last week at the follow-up appointment. Pt. continues to work on improving RUE strength, and [facility] skills in order to be able to use her hand during functional ADL, and IADL tasks.                      OT Education - 03/03/19 1152    Education Details  Positioning, UE care    Person(s) Educated  Patient    Methods  Explanation;Demonstration;Verbal cues    Comprehension  Verbalized understanding;Returned demonstration          OT Long Term Goals - 03/03/19 1200      OT LONG TERM GOAL #1   Title  Pt will complete self feeding using adaptive utensils and compenstory techniques using R hand.    Baseline  Pt needs help with holding cup, cutting meat and uses L hand as assist.    Time  12    Period  Weeks    Status  Achieved      OT LONG TERM GOAL #2   Title  Pt will complete LB dressing using adaptive equipment with min assist and cues in unsupported sitting.    Baseline  Now able to do her socks, pants but needs help with AFOs and shoes     Time  12    Period  Weeks    Status  On-going    Target Date  03/05/19      OT LONG TERM GOAL #3   Title  Pt will complete toileting and hygiene with adaptive aid with min assist and cues on elevated toilet with use of grab bar and FWW for balance.    Baseline  improving but still requires occasional assist    Time  12    Period  Weeks    Status  On-going    Target Date  03/05/19      OT LONG TERM GOAL #4   Title  Pt will be educated in HEP for strengthening and fine motor coordination.    Baseline  ongoing additions to home program    Time  12    Period  Weeks    Status  On-going    Target Date  03/05/19      OT LONG TERM GOAL #5   Title  Pt will increase strength  in B hands for use during ADLs by 3#    Baseline  exceeded initial goal, revised to 3 additional #  Time  12    Period  Weeks    Status  On-going    Target Date   03/05/19      OT LONG TERM GOAL #6   Title  Pt will increase strength 2# in B pinch for lateral and 3 point for use during ADLs.    Baseline  minimal changes in pinch 12/11/2018    Time  12    Period  Weeks    Status  Achieved      OT LONG TERM GOAL #7   Title  Patient will demonstrate improved fine motor coordination skills to minimize dropping items such as pills    Baseline  drops items frequently    Time  12    Period  Weeks    Status  New    Target Date  03/05/19      OT LONG TERM GOAL #8   Title  Patient to demonstrate ability to manipulate buttons with or without use of adaptive equipment.    Baseline  unable to perform buttons and requires assist.    Time  12    Period  Weeks    Status  On-going    Target Date  03/03/19            Plan - 03/03/19 1157    Clinical Impression Statement Pt. reports that her right arm is much better after taking Napraxen perscribed by her physician last week at the follow-up appointment. Pt. continues to work on improving RUE strength, and [facility] skills in order to be able to use her hand during functional ADL, and IADL tasks.    OT Occupational Profile and History  Detailed Assessment- Review of Records and additional review of physical, cognitive, psychosocial history related to current functional performance    Occupational performance deficits (Please refer to evaluation for details):  ADL's;IADL's;Education;Leisure    Body Structure / Function / Physical Skills  Strength;Gait;FMC;IADL;Balance;ADL;Coordination;Endurance;UE functional use;Mobility    Clinical Decision Making  Several treatment options, min-mod task modification necessary    Comorbidities Affecting Occupational Performance:  Presence of comorbidities impacting occupational performance    Modification or Assistance to Complete Evaluation   Min-Moderate modification of tasks or assist with assess necessary to complete eval    OT Frequency  2x / week    OT Duration  12 weeks     OT Treatment/Interventions  Moist Heat;Self-care/ADL training;Therapeutic exercise;Patient/family education;Energy conservation;Therapist, nutritional;Therapeutic activities;Balance training;DME and/or AE instruction;Manual Therapy;Psychosocial skills training    Plan  Rec continued OT 2x per week for 12 weeks    Consulted and Agree with Plan of Care  Patient       Patient will benefit from skilled therapeutic intervention in order to improve the following deficits and impairments:   Body Structure / Function / Physical Skills: Strength, Gait, FMC, IADL, Balance, ADL, Coordination, Endurance, UE functional use, Mobility       Visit Diagnosis: Muscle weakness (generalized)  Other lack of coordination    Problem List Patient Active Problem List   Diagnosis Date Noted  . Nasal mass 01/30/2019  . Vitamin D deficiency 10/29/2018  . Migraine without status migrainosus, not intractable   . Chronic pain syndrome   . Lumbar disc disease with radiculopathy 07/21/2017  . Lumbar foraminal stenosis 07/21/2017  . Unable to walk 07/21/2017  . Multiple falls 07/21/2017  . Displaced fracture of proximal end of right fibula 07/21/2017  . Lower extremity weakness 05/28/2017  . Right lower quadrant abdominal pain 08/22/2016  .  Abnormal TSH 01/10/2016  . Morbid obesity ([place]) 01/12/2014  . Routine general medical examination at a health care facility 10/28/2012  . GERD (gastroesophageal reflux disease) 10/28/2012  . Anxiety 12/20/2011  . Allergic rhinitis 12/20/2011  . Asthma 12/20/2011  . Lapband APL with HH repair 03/31/2011  . Uncontrolled type 2 diabetes mellitus with hyperglycemia, with long-term current use of insulin ([place]) 06/30/2007  . Hyperlipidemia 06/30/2007  . Essential hypertension 06/27/2007    Harrel Carina, MS, OTR/L 03/03/2019, 12:12 PM  [place] [address] [place], [place], [address] Phone:  [phone]   Fax:  [phone]  Name: [name] MRN: [id number] Date of Birth: 12-May-1965

## 2019-03-05 ENCOUNTER — Ambulatory Visit: Payer: BC Managed Care – PPO | Admitting: Occupational Therapy

## 2019-03-05 ENCOUNTER — Ambulatory Visit: Payer: BC Managed Care – PPO | Admitting: Physical Therapy

## 2019-03-10 ENCOUNTER — Other Ambulatory Visit: Payer: Self-pay

## 2019-03-10 ENCOUNTER — Ambulatory Visit: Payer: BC Managed Care – PPO | Admitting: Occupational Therapy

## 2019-03-10 ENCOUNTER — Ambulatory Visit: Payer: BC Managed Care – PPO | Admitting: Physical Therapy

## 2019-03-10 ENCOUNTER — Encounter: Payer: Self-pay | Admitting: Occupational Therapy

## 2019-03-10 ENCOUNTER — Encounter: Payer: Self-pay | Admitting: Physical Therapy

## 2019-03-10 DIAGNOSIS — R110 Nausea: Secondary | ICD-10-CM

## 2019-03-10 DIAGNOSIS — M25571 Pain in right ankle and joints of right foot: Secondary | ICD-10-CM

## 2019-03-10 DIAGNOSIS — M6281 Muscle weakness (generalized): Secondary | ICD-10-CM

## 2019-03-10 DIAGNOSIS — R262 Difficulty in walking, not elsewhere classified: Secondary | ICD-10-CM

## 2019-03-10 DIAGNOSIS — R278 Other lack of coordination: Secondary | ICD-10-CM

## 2019-03-10 DIAGNOSIS — M25551 Pain in right hip: Secondary | ICD-10-CM

## 2019-03-10 DIAGNOSIS — R29898 Other symptoms and signs involving the musculoskeletal system: Secondary | ICD-10-CM

## 2019-03-10 NOTE — Telephone Encounter (Signed)
Last prescribed on 01/22/2018. Last appointment on 01/30/2019 (follow up). No future appointment

## 2019-03-10 NOTE — Therapy (Signed)
[place] MAIN [facility] SERVICES [address] [place], [place], [address] Phone: [phone]   Fax:  [phone]  Occupational Therapy Treatment/Recertification Note  Patient Details  Name: [name] MRN: [id number] Date of Birth: 07-20-1965 Referring Provider (OT): Maria Friendly, MD   Encounter Date: 03/10/2019  OT End of Session - 03/10/19 1213    Visit Number  31    Number of Visits  52    Date for OT Re-Evaluation  06/02/19    Authorization Type  Progress report period starting 11/18/18    OT Start Time  1145    OT Stop Time  1230    OT Time Calculation (min)  45 min    Activity Tolerance  Patient tolerated treatment well;Patient limited by pain    Behavior During Therapy  [facility] for tasks assessed/performed       Past Medical History:  Diagnosis Date  . Allergy   . Amyotrophic lateral sclerosis (ALS) ([place]) 09/23/2018  . Anemia   . Anxiety   . Arthritis   . Asthma   . Colon polyp   . Complication of anesthesia   . Diabetes mellitus   . Edema   . Fatigue   . Hypertension   . IBS (irritable bowel syndrome)   . Lump in female breast   . Methicillin resistant Staphylococcus aureus in conditions classified elsewhere and of unspecified site   . Migraines   . Morbid obesity ([place])   . Night sweats   . Nonspecific abnormal results of thyroid function study   . Other B-complex deficiencies   . Paresthesias   . PONV (postoperative nausea and vomiting)   . Pure hypercholesterolemia   . Reflux   . Sacral fracture ([place])   . Sinus problem   . Symptomatic states associated with artificial menopause   . Thyroid disease   . Type II or unspecified type diabetes mellitus without mention of complication, not stated as uncontrolled   . Unspecified sleep apnea     Past Surgical History:  Procedure Laterality Date  . abcess removal  1997   MRSA  . ABDOMINAL HYSTERECTOMY  1997   complete in 1997, partial was in 1995  . carpal  tunnel release r  07/03/13   [facility]orf  . [facility]  . CHOLECYSTECTOMY  03/1989  . COLONOSCOPY  03/21/2011   Normal.  [facility].  [name] KNEE SURGERY  2006  . LAPAROSCOPIC GASTRIC BANDING  05/2009  . TONSILLECTOMY AND ADENOIDECTOMY  1974  . TUBAL LIGATION    . ULNAR TUNNEL RELEASE Right 02/05/2018   Procedure: CUBITAL TUNNEL RELEASE/DECOMPRESSION;  Surgeon: Melrose Nakayama, MD;  Location: [place];  Service: Orthopedics;  Laterality: Right;    There were no vitals filed for this visit.  Subjective Assessment - 03/10/19 1210    Subjective   Pt. reports that her daughter came in from [place] for the holidays.    Patient is accompanied by:  Family member    Pertinent History  [name] was recently diagnosed with ALS at [facility].  She has had a recent decrease in strength in L side of body, UE and LE with decreased coordination.  She feels like she is dropping cups more and needs to use both hands for hand to mouth and handwriting has decreased to almost illegible.    Patient Stated Goals  I want to work on handwriting, being able to hold a cup again and regain strength and  coordination in both hands.    Currently in Pain?  No/denies         [facility] OT Assessment - 03/10/19 1219      Coordination   Right 9 Hole Peg Test  23    Left 9 Hole Peg Test  24      Hand Function   Right Hand Grip (lbs)  30    Right Hand Lateral Pinch  7 lbs    Right Hand 3 Point Pinch  4 lbs    Left Hand Grip (lbs)  34    Left Hand Lateral Pinch  7 lbs    Left 3 point pinch  6 lbs      OT TREATMENT    Measurements were obtained, and goals were reviewed with the pt.  Neuro muscular re-education:  Pt. worked on grasping, and manipulating 1/2" washers from a magnetic dish using bilateral [facility] skills. Pt. worked on reaching up, stabilizing, and sustaining shoulder elevation while placing the washer over a small precise target on vertical dowels positioned at various angles.    Therapeutic Exercise:   Pt. performed gross gripping with grip strengthener. Pt. worked on sustaining grip while grasping pegs and reaching at various heights. The gripper was set at 11.2# of grip strength force.   Response to Treatment  Pt. is making excellent progress with Bilateral UE strength, grip strength, and pinch strength. Pt. continues to have difficulty with [facility] skills managing zippers, and buttons. Pt.'s Right elbow is feeling better, and has improved. Pt. is now able to engage her right UE during ADLs, and IADL tasks. Pt. continues to work on improving UE functioning in order to improve overall ADL, and IALD functioning.               OT Education - 03/10/19 1213    Education Details  Positioning, UE care    Person(s) Educated  Patient    Methods  Explanation;Demonstration;Verbal cues    Comprehension  Verbalized understanding;Returned demonstration          OT Long Term Goals - 03/10/19 1200      OT LONG TERM GOAL #1   Title  Pt will complete self feeding using adaptive utensils and compenstory techniques using R hand.    Baseline  Pt needs help with holding cup, cutting meat and uses L hand as assist.    Time  12    Period  Weeks    Status  Achieved      OT LONG TERM GOAL #2   Title  Pt will complete LB dressing using adaptive equipment with min assist and cues in unsupported sitting.    Baseline  Now able to do her socks, pants but needs help with AFOs and shoes     Time  12    Period  Weeks    Status  On-going    Target Date  06/02/19      OT LONG TERM GOAL #3   Title  Pt will complete toileting and hygiene with adaptive aid with min assist and cues on elevated toilet with use of grab bar and FWW for balance.    Baseline  improving but still requires occasional assist    Time  12    Period  Weeks    Status  On-going    Target Date  06/02/19      OT LONG TERM GOAL #4   Title  Pt will be educated in HEP for strengthening and fine motor  coordination.    Baseline  ongoing additions to home program    Time  12    Period  Weeks    Status  On-going    Target Date  06/02/19      OT LONG TERM GOAL #5   Title  Pt will increase strength  in B hands for use during ADLs by 3#    Baseline  exceeded initial goal, revised to 3 additional #    Time  12    Period  Weeks    Status  On-going    Target Date  06/02/19      OT LONG TERM GOAL #6   Title  Pt will increase strength 2# in B pinch for lateral and 3 point for use during ADLs.    Baseline  minimal changes in pinch 12/11/2018    Time  12    Period  Weeks    Status  Achieved      OT LONG TERM GOAL #7   Title  Patient will demonstrate improved fine motor coordination skills to minimize dropping items such as pills    Baseline  drops items frequently    Time  12    Period  Weeks    Status  New    Target Date  06/02/19      OT LONG TERM GOAL #8   Title  Patient to demonstrate ability to manipulate buttons with or without use of adaptive equipment.    Baseline  unable to perform buttons and requires assist.    Time  12    Period  Weeks    Status  On-going    Target Date  06/02/19            Plan - 03/10/19 1301    Clinical Impression Statement Pt. is making excellent progress with Bilateral UE strength, grip strength, and pinch strength. Pt. continues to have difficulty with [facility] skills managing zippers, and buttons. Pt.'s Right elbow is feeling better, and has improved. Pt. is now able to engage her right UE during ADLs, and IADL tasks. Pt. continues to work on improving UE functioning in order to improve overall ADL, and IALD functioning.    OT Occupational Profile and History  Detailed Assessment- Review of Records and additional review of physical, cognitive, psychosocial history related to current functional performance    Occupational performance deficits (Please refer to evaluation for details):  ADL's;IADL's;Education;Leisure    Body Structure / Function /  Physical Skills  Strength;Gait;FMC;IADL;Balance;ADL;Coordination;Endurance;UE functional use;Mobility    Rehab Potential  Good    Clinical Decision Making  Several treatment options, min-mod task modification necessary    Comorbidities Affecting Occupational Performance:  Presence of comorbidities impacting occupational performance    Comorbidities impacting occupational performance description:  progressive muscle weakness, balance, coordination    Modification or Assistance to Complete Evaluation   Min-Moderate modification of tasks or assist with assess necessary to complete eval    OT Frequency  2x / week    OT Duration  12 weeks    OT Treatment/Interventions  Moist Heat;Self-care/ADL training;Therapeutic exercise;Patient/family education;Energy conservation;Therapist, nutritional;Therapeutic activities;Balance training;DME and/or AE instruction;Manual Therapy;Psychosocial skills training    OT Home Exercise Plan  Pt educated in use of ice at home using Dixie cups.    Consulted and Agree with Plan of Care  Patient       Patient will benefit from skilled therapeutic intervention in order to improve the following deficits and impairments:   Body Structure /  Function / Physical Skills: Strength, Gait, FMC, IADL, Balance, ADL, Coordination, Endurance, UE functional use, Mobility       Visit Diagnosis: Muscle weakness (generalized)  Other lack of coordination    Problem List Patient Active Problem List   Diagnosis Date Noted  . Nasal mass 01/30/2019  . Vitamin D deficiency 10/29/2018  . Migraine without status migrainosus, not intractable   . Chronic pain syndrome   . Lumbar disc disease with radiculopathy 07/21/2017  . Lumbar foraminal stenosis 07/21/2017  . Unable to walk 07/21/2017  . Multiple falls 07/21/2017  . Displaced fracture of proximal end of right fibula 07/21/2017  . Lower extremity weakness 05/28/2017  . Right lower quadrant abdominal pain 08/22/2016  .  Abnormal TSH 01/10/2016  . Morbid obesity ([place]) 01/12/2014  . Routine general medical examination at a health care facility 10/28/2012  . GERD (gastroesophageal reflux disease) 10/28/2012  . Anxiety 12/20/2011  . Allergic rhinitis 12/20/2011  . Asthma 12/20/2011  . Lapband APL with HH repair 03/31/2011  . Uncontrolled type 2 diabetes mellitus with hyperglycemia, with long-term current use of insulin ([place]) 06/30/2007  . Hyperlipidemia 06/30/2007  . Essential hypertension 06/27/2007    Harrel Carina, MS, OTR/L 03/10/2019, 1:09 PM  [place] MAIN [facility] SERVICES [address] [place], [place], [address] Phone: [phone]   Fax:  [phone]  Name: [name] MRN: [id number] Date of Birth: Aug 01, 1965

## 2019-03-10 NOTE — Therapy (Signed)
[place] MAIN [facility] SERVICES [address] [place], [place], [address] Phone: [phone]   Fax:  [phone]  Physical Therapy Treatment  Patient Details  Name: [name] MRN: [id number] Date of Birth: 15-Jul-1965 Referring Provider (PT): Cornell Barman   Encounter Date: 03/10/2019  PT End of Session - 03/10/19 1051    Visit Number  45    Number of Visits  57    Date for PT Re-Evaluation  03/25/19    PT Start Time  1100    PT Stop Time  1145    PT Time Calculation (min)  45 min    Equipment Utilized During Treatment  Gait belt    Activity Tolerance  Patient tolerated treatment well    Behavior During Therapy  WFL for tasks assessed/performed       Past Medical History:  Diagnosis Date  . Allergy   . Amyotrophic lateral sclerosis (ALS) ([place]) 09/23/2018  . Anemia   . Anxiety   . Arthritis   . Asthma   . Colon polyp   . Complication of anesthesia   . Diabetes mellitus   . Edema   . Fatigue   . Hypertension   . IBS (irritable bowel syndrome)   . Lump in female breast   . Methicillin resistant Staphylococcus aureus in conditions classified elsewhere and of unspecified site   . Migraines   . Morbid obesity ([place])   . Night sweats   . Nonspecific abnormal results of thyroid function study   . Other B-complex deficiencies   . Paresthesias   . PONV (postoperative nausea and vomiting)   . Pure hypercholesterolemia   . Reflux   . Sacral fracture ([place])   . Sinus problem   . Symptomatic states associated with artificial menopause   . Thyroid disease   . Type II or unspecified type diabetes mellitus without mention of complication, not stated as uncontrolled   . Unspecified sleep apnea     Past Surgical History:  Procedure Laterality Date  . abcess removal  1997   MRSA  . ABDOMINAL HYSTERECTOMY  1997   complete in 1997, partial was in 1995  . carpal tunnel release r  07/03/13   [facility]orf  . [place]  . CHOLECYSTECTOMY  03/1989  . COLONOSCOPY  03/21/2011   Normal.  [facility].  [name] KNEE SURGERY  2006  . LAPAROSCOPIC GASTRIC BANDING  05/2009  . TONSILLECTOMY AND ADENOIDECTOMY  1974  . TUBAL LIGATION    . ULNAR TUNNEL RELEASE Right 02/05/2018   Procedure: CUBITAL TUNNEL RELEASE/DECOMPRESSION;  Surgeon: Melrose Nakayama, MD;  Location: [place];  Service: Orthopedics;  Laterality: Right;    There were no vitals filed for this visit.      Treatment: Standing with ball sorter and LUE and RUE alternating sorting balls without UE support x 4 each side  Supine SAQ with 3 sec hold x 20 x 2 sets     Pt educated throughout session about proper posture and technique with exercises. Improved exercise technique, movement at target joints, use of target muscles after min to mod verbal, visual, tactile cues.                     PT Education - 03/10/19 1051    Education provided  Yes    Education Details  hep    Person(s) Educated  Patient    Comprehension  Verbalized understanding;Returned demonstration;Tactile cues required;Need further  instruction       PT Short Term Goals - 10/23/18 1159      PT SHORT TERM GOAL #1   Title  Patient will be independent in home exercise program to improve strength/mobility for better functional independence with ADLs.    Time  6    Period  Weeks    Status  Partially Met    Target Date  09/10/18        PT Long Term Goals - 02/05/19 1158      PT LONG TERM GOAL #1   Title  Patient will increase 10 meter walk test to >.50 m/s as to improve gait speed for better community ambulation and to reduce fall risk.    Baseline  . 36 m/sec,, 10/23/18 =.43 m/ sec, 02/05/19=.49 m/sec    Time  8    Period  Weeks    Status  Partially Met    Target Date  03/25/19      PT LONG TERM GOAL #2   Title  Patient will transfer sit to stand  from 19 inch mat without assist or anyone supporting AD  to demonstrate improved LE strength to decrease falls  risk.     Baseline  02/05/19=    Time  12    Period  Weeks    Status  Achieved      PT LONG TERM GOAL #3   Title  Patient will demonstrate stance without AD  x 1 min to demonstrate decreased falls risk.     Baseline  02/05/19=She is able to stand for 20 seconds without UE support and 1 min with 1 UE support    Time  12    Period  Weeks    Status  Achieved    Target Date  03/25/19      PT LONG TERM GOAL #4   Title  Patient will  ambulate 500 feet to work towards community ambulation distances.     Baseline  270 feet, 10/23/18= 305 feet, 02/05/19 = 300 feet    Time  12    Period  Weeks    Status  Partially Met    Target Date  03/25/19      PT LONG TERM GOAL #5   Title  Patient (< 94 years old) will complete five times sit to stand test in < 10 seconds indicating an increased LE strength and improved balance.    Baseline  20.28 m/sec. 10/23/18 =16.82 ( from green chair)02/05/19=,12.85 sec    Time  12    Period  Weeks    Status  Partially Met    Target Date  03/25/19      PT LONG TERM GOAL #6   Title  Patient will report a worst pain of 3/10 on VAS in left knee             to improve tolerance with ADLs and reduced symptoms with activities.    Baseline  No pain in knees 02/05/19    Time  12    Period  Weeks    Status  Achieved    Target Date  03/25/19            Plan - 03/10/19 1052    Clinical Impression Statement  Pt was able to progress through exercises today with decreases in loss of dynamic standing during reaching activities.  Pt continues to demonstrate improvement with dynamic and static balance, with decreased LOB noted with dynamic activities on even  surfaces.  Pt has decreased strength and single leg stability.  Pt would continue to benefit from skilled therapy services in order to continue strengthening LE's and improving dynamic and static balance.   Rehab Potential  Good    PT Frequency  2x / week    PT Duration  12 weeks    PT Treatment/Interventions  Gait  training;Therapeutic activities;Therapeutic exercise;Balance training;Neuromuscular re-education;Patient/family education;Orthotic Fit/Training;Manual techniques    PT Next Visit Plan  Continue with current program.    PT Home Exercise Plan  No updates this date     Consulted and Agree with Plan of Care  Patient    Family Member Consulted  Spouse        Patient will benefit from skilled therapeutic intervention in order to improve the following deficits and impairments:  Decreased balance, Decreased endurance, Decreased mobility, Difficulty walking, Impaired sensation, Decreased range of motion, Decreased activity tolerance, Decreased coordination, Decreased strength, Pain, Postural dysfunction  Visit Diagnosis: Other lack of coordination  Muscle weakness (generalized)  Pain in right ankle and joints of right foot  Weakness of right leg  Difficulty in walking, not elsewhere classified  Pain in right hip     Problem List Patient Active Problem List   Diagnosis Date Noted  . Nasal mass 01/30/2019  . Vitamin D deficiency 10/29/2018  . Migraine without status migrainosus, not intractable   . Chronic pain syndrome   . Lumbar disc disease with radiculopathy 07/21/2017  . Lumbar foraminal stenosis 07/21/2017  . Unable to walk 07/21/2017  . Multiple falls 07/21/2017  . Displaced fracture of proximal end of right fibula 07/21/2017  . Lower extremity weakness 05/28/2017  . Right lower quadrant abdominal pain 08/22/2016  . Abnormal TSH 01/10/2016  . Morbid obesity ([place]) 01/12/2014  . Routine general medical examination at a health care facility 10/28/2012  . GERD (gastroesophageal reflux disease) 10/28/2012  . Anxiety 12/20/2011  . Allergic rhinitis 12/20/2011  . Asthma 12/20/2011  . Lapband APL with HH repair 03/31/2011  . Uncontrolled type 2 diabetes mellitus with hyperglycemia, with long-term current use of insulin ([place]) 06/30/2007  . Hyperlipidemia 06/30/2007  . Essential  hypertension 06/27/2007    Alanson Puls, PT DPT 03/10/2019, 10:56 AM  [place] MAIN [facility] SERVICES [address] [place], [place], [address] Phone: [phone]   Fax:  [phone]  Name: [name] MRN: [id number] Date of Birth: [birth year]

## 2019-03-11 MED ORDER — ONDANSETRON 4 MG PO TBDP: ORAL_TABLET | ORAL | 0 refills | Status: DC

## 2019-03-11 NOTE — Telephone Encounter (Signed)
Refill sent to pharmacy.   

## 2019-03-12 ENCOUNTER — Ambulatory Visit: Payer: BC Managed Care – PPO | Admitting: Occupational Therapy

## 2019-03-12 ENCOUNTER — Ambulatory Visit: Payer: BC Managed Care – PPO | Admitting: Physical Therapy

## 2019-03-15 ENCOUNTER — Other Ambulatory Visit: Payer: Self-pay | Admitting: Primary Care

## 2019-03-15 DIAGNOSIS — E1165 Type 2 diabetes mellitus with hyperglycemia: Secondary | ICD-10-CM

## 2019-03-15 DIAGNOSIS — I10 Essential (primary) hypertension: Secondary | ICD-10-CM

## 2019-03-15 DIAGNOSIS — R238 Other skin changes: Secondary | ICD-10-CM

## 2019-03-17 ENCOUNTER — Other Ambulatory Visit: Payer: Self-pay

## 2019-03-17 ENCOUNTER — Encounter: Payer: Self-pay | Admitting: Physical Therapy

## 2019-03-17 ENCOUNTER — Ambulatory Visit: Payer: BC Managed Care – PPO

## 2019-03-17 ENCOUNTER — Encounter: Payer: Self-pay | Admitting: Occupational Therapy

## 2019-03-17 ENCOUNTER — Ambulatory Visit: Payer: BC Managed Care – PPO | Admitting: Occupational Therapy

## 2019-03-17 DIAGNOSIS — M25551 Pain in right hip: Secondary | ICD-10-CM

## 2019-03-17 DIAGNOSIS — R262 Difficulty in walking, not elsewhere classified: Secondary | ICD-10-CM

## 2019-03-17 DIAGNOSIS — M25571 Pain in right ankle and joints of right foot: Secondary | ICD-10-CM

## 2019-03-17 DIAGNOSIS — M6281 Muscle weakness (generalized): Secondary | ICD-10-CM | POA: Diagnosis not present

## 2019-03-17 DIAGNOSIS — R278 Other lack of coordination: Secondary | ICD-10-CM

## 2019-03-17 DIAGNOSIS — R29898 Other symptoms and signs involving the musculoskeletal system: Secondary | ICD-10-CM

## 2019-03-17 NOTE — Therapy (Signed)
[place] MAIN [facility] SERVICES [address] [place], [place], [address] Phone: [phone]   Fax:  [phone]  Physical Therapy Treatment  Patient Details  Name: SINA SUMPTER MRN: [id number] Date of Birth: Jun 27, 1965 Referring Provider (PT): Cornell Barman   Encounter Date: 03/17/2019  PT End of Session - 03/17/19 1101    Visit Number  46    Number of Visits  57    Date for PT Re-Evaluation  03/25/19    PT Start Time  1102    PT Stop Time  1146    PT Time Calculation (min)  44 min    Equipment Utilized During Treatment  Gait belt    Activity Tolerance  Patient tolerated treatment well    Behavior During Therapy  WFL for tasks assessed/performed       Past Medical History:  Diagnosis Date  . Allergy   . Amyotrophic lateral sclerosis (ALS) ([place]) 09/23/2018  . Anemia   . Anxiety   . Arthritis   . Asthma   . Colon polyp   . Complication of anesthesia   . Diabetes mellitus   . Edema   . Fatigue   . Hypertension   . IBS (irritable bowel syndrome)   . Lump in female breast   . Methicillin resistant Staphylococcus aureus in conditions classified elsewhere and of unspecified site   . Migraines   . Morbid obesity ([place])   . Night sweats   . Nonspecific abnormal results of thyroid function study   . Other B-complex deficiencies   . Paresthesias   . PONV (postoperative nausea and vomiting)   . Pure hypercholesterolemia   . Reflux   . Sacral fracture ([place])   . Sinus problem   . Symptomatic states associated with artificial menopause   . Thyroid disease   . Type II or unspecified type diabetes mellitus without mention of complication, not stated as uncontrolled   . Unspecified sleep apnea     Past Surgical History:  Procedure Laterality Date  . abcess removal  1997   MRSA  . ABDOMINAL HYSTERECTOMY  1997   complete in 1997, partial was in 1995  . carpal tunnel release r  07/03/13   Dalldorf  . [place]  . CHOLECYSTECTOMY  03/1989  . COLONOSCOPY  03/21/2011   Normal.  [facility].  [name] KNEE SURGERY  2006  . LAPAROSCOPIC GASTRIC BANDING  05/2009  . TONSILLECTOMY AND ADENOIDECTOMY  1974  . TUBAL LIGATION    . ULNAR TUNNEL RELEASE Right 02/05/2018   Procedure: CUBITAL TUNNEL RELEASE/DECOMPRESSION;  Surgeon: Melrose Nakayama, MD;  Location: [place];  Service: Orthopedics;  Laterality: Right;    There were no vitals filed for this visit.  Subjective Assessment - 03/17/19 1107    Subjective  Patient reported that she is doing well today, no pain. No new falls since last visit, has had some IBS flares over the weekend. Recieved new lower leg braces Friday.    Pertinent History   Patient fx her sacrum 9/18 and  she lost sensation to right foot, and she lost ability to DF her right foot. Patient had back surgery 03/2017 , she was sent home. She was walking with no device indoors and she could get out of the house and do the steps. June 18, 2017  her LLE was getting weaker and the right leg was getting stronger. Patient had follow up with neurosurgery. She was scheduled for a steriod  shot Jul 23, 2017, but on May 3 her legs didnt  feel right, she fell 4 times, she fractured her R fibula  on may 3rd, she went to hospital and spent one week. She went to SNF for 1 month. She had another neuro consult, resulting in kDx of  polyneuropathy. Then she had an apt with and [facility] said it was not polyneuropathy, (in [place])  She has a second consultation scheduled Dec 30 for a  Neuromuscular.consult.  She had a scan of the spine T 12-28-17  who then sent her to a thoracic surgeon at the end of November and was recommended to not due surgery. She does not know what her Dx is and she has been having home PT beginning August 25, 2017 . She has had HHPT 2 x  week for the last 5 months. She is able to walk 50 feet with RW. She is able to transfer from wc to stand independently.     How long can you stand comfortably?  5 mins     Patient Stated Goals  to walk without the RW, or walk better, use the bathroom    Currently in Pain?  No/denies         TREATMENT:  Therapeutic exercises:   Nu-step L 5 x 4 mins , L 3 x 2 mins, seat at 10 Side stepping in parallel bars GTB x 4 laps (20 feet each) Standing hip abd GTB x15  Standing hip ext  GTB x15  Seated marching x15 in parallel bars for UE support Seated LAQ x15   Patient performed with instruction, verbal cues, tactile cues of therapist: goal: increase tissue extensibility, promote proper posture, improve mobility  Pt educated throughout session about proper posture and technique with exercises. Improved exercise technique, movement at target joints, use of target muscles after min to mod verbal, visual, tactile cues.   Patient response/clinical impression: The patient had excellent motivation throughout the session. The patient was able to progress strengthening program with increased fatigue this session but overall was able to maintain exercise form/technique with min-mod verbal/visual cues. The patient would benefit from further skilled PT to continue to progress towards goals to maximize safety, mobility, and independence.    PT Education - 03/17/19 1100    Education provided  Yes    Education Details  thereapeutic exercise form/technique    Person(s) Educated  Patient    Methods  Explanation;Tactile cues;Demonstration;Verbal cues    Comprehension  Verbalized understanding;Returned demonstration;Verbal cues required;Tactile cues required       PT Short Term Goals - 10/23/18 1159      PT SHORT TERM GOAL #1   Title  Patient will be independent in home exercise program to improve strength/mobility for better functional independence with ADLs.    Time  6    Period  Weeks    Status  Partially Met    Target Date  09/10/18        PT Long Term Goals - 02/05/19 1158      PT LONG TERM GOAL #1   Title  Patient will increase 10 meter walk test to >.50 m/s  as to improve gait speed for better community ambulation and to reduce fall risk.    Baseline  . 36 m/sec,, 10/23/18 =.43 m/ sec, 02/05/19=.49 m/sec    Time  8    Period  Weeks    Status  Partially Met    Target Date  03/25/19      PT  LONG TERM GOAL #2   Title  Patient will transfer sit to stand  from 19 inch mat without assist or anyone supporting AD  to demonstrate improved LE strength to decrease falls risk.     Baseline  02/05/19=    Time  12    Period  Weeks    Status  Achieved      PT LONG TERM GOAL #3   Title  Patient will demonstrate stance without AD  x 1 min to demonstrate decreased falls risk.     Baseline  02/05/19=She is able to stand for 20 seconds without UE support and 1 min with 1 UE support    Time  12    Period  Weeks    Status  Achieved    Target Date  03/25/19      PT LONG TERM GOAL #4   Title  Patient will  ambulate 500 feet to work towards community ambulation distances.     Baseline  270 feet, 10/23/18= 305 feet, 02/05/19 = 300 feet    Time  12    Period  Weeks    Status  Partially Met    Target Date  03/25/19      PT LONG TERM GOAL #5   Title  Patient (< 63 years old) will complete five times sit to stand test in < 10 seconds indicating an increased LE strength and improved balance.    Baseline  20.28 m/sec. 10/23/18 =16.82 ( from green chair)02/05/19=,12.85 sec    Time  12    Period  Weeks    Status  Partially Met    Target Date  03/25/19      PT LONG TERM GOAL #6   Title  Patient will report a worst pain of 3/10 on VAS in left knee             to improve tolerance with ADLs and reduced symptoms with activities.    Baseline  No pain in knees 02/05/19    Time  12    Period  Weeks    Status  Achieved    Target Date  03/25/19            Plan - 03/17/19 1108    Clinical Impression Statement  The patient had excellent motivation throughout the session. The patient was able to progress strengthening program with increased fatigue this session but  overall was able to maintain exercise form/technique with min-mod verbal/visual cues. The patient would benefit from further skilled PT to continue to progress towards goals to maximize safety, mobility, and independence.    Rehab Potential  Good    Clinical Impairments Affecting Rehab Potential  Multiple tests concerning for neurologic involvement     PT Frequency  2x / week    PT Duration  12 weeks    PT Treatment/Interventions  Gait training;Therapeutic activities;Therapeutic exercise;Balance training;Neuromuscular re-education;Patient/family education;Orthotic Fit/Training;Manual techniques    PT Next Visit Plan  Continue with current program.    PT Home Exercise Plan  No updates this date     Consulted and Agree with Plan of Care  Patient    Family Member Consulted  Spouse        Patient will benefit from skilled therapeutic intervention in order to improve the following deficits and impairments:  Decreased balance, Decreased endurance, Decreased mobility, Difficulty walking, Impaired sensation, Decreased range of motion, Decreased activity tolerance, Decreased coordination, Decreased strength, Pain, Postural dysfunction  Visit Diagnosis: Muscle weakness (generalized)  Other  lack of coordination  Pain in right ankle and joints of right foot  Weakness of right leg  Difficulty in walking, not elsewhere classified  Pain in right hip     Problem List Patient Active Problem List   Diagnosis Date Noted  . Nasal mass 01/30/2019  . Vitamin D deficiency 10/29/2018  . Migraine without status migrainosus, not intractable   . Chronic pain syndrome   . Lumbar disc disease with radiculopathy 07/21/2017  . Lumbar foraminal stenosis 07/21/2017  . Unable to walk 07/21/2017  . Multiple falls 07/21/2017  . Displaced fracture of proximal end of right fibula 07/21/2017  . Lower extremity weakness 05/28/2017  . Right lower quadrant abdominal pain 08/22/2016  . Abnormal TSH 01/10/2016  .  Morbid obesity ([place]) 01/12/2014  . Routine general medical examination at a health care facility 10/28/2012  . GERD (gastroesophageal reflux disease) 10/28/2012  . Anxiety 12/20/2011  . Allergic rhinitis 12/20/2011  . Asthma 12/20/2011  . Lapband APL with HH repair 03/31/2011  . Uncontrolled type 2 diabetes mellitus with hyperglycemia, with long-term current use of insulin ([place]) 06/30/2007  . Hyperlipidemia 06/30/2007  . Essential hypertension 06/27/2007    Lieutenant Diego PT, DPT 11:53 AM,03/17/19   [place] MAIN [facility] SERVICES [address] [place], [place], [address] Phone: [phone]   Fax:  [phone]  Name: KORBYN VANES MRN: [id number] Date of Birth: Jul 21, 1965

## 2019-03-17 NOTE — Therapy (Signed)
[place] MAIN [facility] SERVICES [address] [place], [place], [address] Phone: [phone]   Fax:  [phone]  Occupational Therapy Treatment  Patient Details  Name: [name] MRN: [id number] Date of Birth: [birth year] Referring Provider (OT): Alma Friendly, MD   Encounter Date: 03/17/2019  OT End of Session - 03/17/19 1152    Visit Number  32    Number of Visits  50    Date for OT Re-Evaluation  06/02/19    Authorization Type  Progress report period starting 11/18/18    OT Start Time  1145    Activity Tolerance  Patient tolerated treatment well;Patient limited by pain    Behavior During Therapy  [facility] for tasks assessed/performed       Past Medical History:  Diagnosis Date  . Allergy   . Amyotrophic lateral sclerosis (ALS) ([place]) 09/23/2018  . Anemia   . Anxiety   . Arthritis   . Asthma   . Colon polyp   . Complication of anesthesia   . Diabetes mellitus   . Edema   . Fatigue   . Hypertension   . IBS (irritable bowel syndrome)   . Lump in female breast   . Methicillin resistant Staphylococcus aureus in conditions classified elsewhere and of unspecified site   . Migraines   . Morbid obesity ([place])   . Night sweats   . Nonspecific abnormal results of thyroid function study   . Other B-complex deficiencies   . Paresthesias   . PONV (postoperative nausea and vomiting)   . Pure hypercholesterolemia   . Reflux   . Sacral fracture ([place])   . Sinus problem   . Symptomatic states associated with artificial menopause   . Thyroid disease   . Type II or unspecified type diabetes mellitus without mention of complication, not stated as uncontrolled   . Unspecified sleep apnea     Past Surgical History:  Procedure Laterality Date  . abcess removal  1997   MRSA  . ABDOMINAL HYSTERECTOMY  1997   complete in 1997, partial was in 1995  . carpal tunnel release r  07/03/13   Dalldorf  . [place]  .  CHOLECYSTECTOMY  03/1989  . COLONOSCOPY  03/21/2011   Normal.  [facility].  [name] KNEE SURGERY  2006  . LAPAROSCOPIC GASTRIC BANDING  05/2009  . TONSILLECTOMY AND ADENOIDECTOMY  1974  . TUBAL LIGATION    . ULNAR TUNNEL RELEASE Right 02/05/2018   Procedure: CUBITAL TUNNEL RELEASE/DECOMPRESSION;  Surgeon: Melrose Nakayama, MD;  Location: [place];  Service: Orthopedics;  Laterality: Right;    There were no vitals filed for this visit.  Subjective Assessment - 03/17/19 1151    Subjective   Pt. reports that her daughter came in from [place] for the holidays.    Patient is accompanied by:  Family member    Pertinent History  [name] was recently diagnosed with ALS at [place] [facility].  She has had a recent decrease in strength in L side of body, UE and LE with decreased coordination.  She feels like she is dropping cups more and needs to use both hands for hand to mouth and handwriting has decreased to almost illegible.    Currently in Pain?  No/denies      OT TREATMENT    Neuro muscular re-education:  Pt. worked on grasping, and manipulating 1", 3/4", 1/2" washers from a magnetic dish using point grasp pattern. Pt. worked on  reaching up, stabilizing, and sustaining shoulder elevation while placing the washer over a small precise target on vertical dowels positioned at various angles. Pt. performed [facility] tasks using the Grooved pegboard. Pt. worked on grasping the grooved pegs from a horizontal position, and moving the pegs to a vertical position in the hand to prepare for placing them in the grooved slot. Pt.  Focused on improving [employer] with manipulating nuts and bolts positioned vertically. Pt. Was able to unscrew both the 1" and 1/2" nuts, however was unable to remove the nuts without dropping them. Pt. Worked on grasping, and manipulating nuts, and bolts on the a vertical dowel placed horizontally on the tabletop. Pt. Completed with no reports of pain.  Response to Treatment   Pt.'s husband has to  have surgery on Thursday. Pt. reports she may need to adjust her schedule next week depending on transportation while her husband is recovering. Pt. is improving, and was able to work on storing the washers witohut difficulty, however dropped several small washers when performing translatory movements of the hand. Pt. Reports no pain during the tasks. Pt. Continues to work on improving UE [facility] skills in order to increase, and improve independence with ADLs, and IADLs.                         OT Education - 03/17/19 1152    Education Details  Positioning, UE care    Person(s) Educated  Patient    Methods  Explanation;Demonstration;Verbal cues    Comprehension  Verbalized understanding;Returned demonstration          OT Long Term Goals - 03/10/19 1200      OT LONG TERM GOAL #1   Title  Pt will complete self feeding using adaptive utensils and compenstory techniques using R hand.    Baseline  Pt needs help with holding cup, cutting meat and uses L hand as assist.    Time  12    Period  Weeks    Status  Achieved      OT LONG TERM GOAL #2   Title  Pt will complete LB dressing using adaptive equipment with min assist and cues in unsupported sitting.    Baseline  Now able to do her socks, pants but needs help with AFOs and shoes     Time  12    Period  Weeks    Status  On-going    Target Date  06/02/19      OT LONG TERM GOAL #3   Title  Pt will complete toileting and hygiene with adaptive aid with min assist and cues on elevated toilet with use of grab bar and FWW for balance.    Baseline  improving but still requires occasional assist    Time  12    Period  Weeks    Status  On-going    Target Date  06/02/19      OT LONG TERM GOAL #4   Title  Pt will be educated in HEP for strengthening and fine motor coordination.    Baseline  ongoing additions to home program    Time  12    Period  Weeks    Status  On-going    Target Date  06/02/19      OT LONG TERM GOAL  #5   Title  Pt will increase strength  in B hands for use during ADLs by 3#    Baseline  exceeded initial goal, revised  to 3 additional #    Time  12    Period  Weeks    Status  On-going    Target Date  06/02/19      OT LONG TERM GOAL #6   Title  Pt will increase strength 2# in B pinch for lateral and 3 point for use during ADLs.    Baseline  minimal changes in pinch 12/11/2018    Time  12    Period  Weeks    Status  Achieved      OT LONG TERM GOAL #7   Title  Patient will demonstrate improved fine motor coordination skills to minimize dropping items such as pills    Baseline  drops items frequently    Time  12    Period  Weeks    Status  On-going    Target Date  06/02/19      OT LONG TERM GOAL #8   Title  Patient to demonstrate ability to manipulate buttons with or without use of adaptive equipment.    Baseline  unable to perform buttons and requires assist.    Time  12    Period  Weeks    Status  On-going    Target Date  06/02/19            Plan - 03/17/19 1152    Clinical Impression Statement  Pt.'s husband has to have surgery on Thursday. Pt. reports she may need to adjust her schedule next week depending on transportation while her husband is recovering. Pt. is improving, and was able to work on storing the washers witohut difficulty, however dropped several small washers when performing translatory movements of the hand. Pt. Reports no pain during the tasks. Pt. Continues to work on improving UE [facility] skills in order to increase, and improve independence with ADLs, and IADLs.     OT Occupational Profile and History  Detailed Assessment- Review of Records and additional review of physical, cognitive, psychosocial history related to current functional performance    Occupational performance deficits (Please refer to evaluation for details):  ADL's;IADL's;Education;Leisure    Body Structure / Function / Physical Skills   Strength;Gait;FMC;IADL;Balance;ADL;Coordination;Endurance;UE functional use;Mobility    Rehab Potential  Good    Clinical Decision Making  Limited treatment options, no task modification necessary    Comorbidities Affecting Occupational Performance:  Presence of comorbidities impacting occupational performance    Modification or Assistance to Complete Evaluation   Min-Moderate modification of tasks or assist with assess necessary to complete eval    OT Frequency  2x / week    OT Duration  12 weeks    OT Treatment/Interventions  Moist Heat;Self-care/ADL training;Therapeutic exercise;Patient/family education;Energy conservation;Therapist, nutritional;Therapeutic activities;Balance training;DME and/or AE instruction;Manual Therapy;Psychosocial skills training    Plan  Rec continued OT 2x per week for 12 weeks    OT Home Exercise Plan  Pt educated in use of ice at home using Dixie cups.    Consulted and Agree with Plan of Care  Patient       Patient will benefit from skilled therapeutic intervention in order to improve the following deficits and impairments:   Body Structure / Function / Physical Skills: Strength, Gait, FMC, IADL, Balance, ADL, Coordination, Endurance, UE functional use, Mobility       Visit Diagnosis: Muscle weakness (generalized)  Other lack of coordination    Problem List Patient Active Problem List   Diagnosis Date Noted  . Nasal mass 01/30/2019  . Vitamin D deficiency 10/29/2018  .  Migraine without status migrainosus, not intractable   . Chronic pain syndrome   . Lumbar disc disease with radiculopathy 07/21/2017  . Lumbar foraminal stenosis 07/21/2017  . Unable to walk 07/21/2017  . Multiple falls 07/21/2017  . Displaced fracture of proximal end of right fibula 07/21/2017  . Lower extremity weakness 05/28/2017  . Right lower quadrant abdominal pain 08/22/2016  . Abnormal TSH 01/10/2016  . Morbid obesity ([place]) 01/12/2014  . Routine general medical  examination at a health care facility 10/28/2012  . GERD (gastroesophageal reflux disease) 10/28/2012  . Anxiety 12/20/2011  . Allergic rhinitis 12/20/2011  . Asthma 12/20/2011  . Lapband APL with HH repair 03/31/2011  . Uncontrolled type 2 diabetes mellitus with hyperglycemia, with long-term current use of insulin ([place]) 06/30/2007  . Hyperlipidemia 06/30/2007  . Essential hypertension 06/27/2007    Harrel Carina, MS, OTR/L 03/17/2019, 12:16 PM  [place] MAIN [facility] SERVICES [address] [place], [place], [address] Phone: [phone]   Fax:  [phone]  Name: LESHA YOUNGKIN MRN: [id number] Date of Birth: 1966/01/23

## 2019-03-19 ENCOUNTER — Ambulatory Visit: Payer: BC Managed Care – PPO | Admitting: Physical Therapy

## 2019-03-19 ENCOUNTER — Ambulatory Visit: Payer: BC Managed Care – PPO | Admitting: Occupational Therapy

## 2019-03-24 ENCOUNTER — Ambulatory Visit: Payer: BC Managed Care – PPO | Attending: Primary Care | Admitting: Occupational Therapy

## 2019-03-24 ENCOUNTER — Other Ambulatory Visit: Payer: Self-pay

## 2019-03-24 ENCOUNTER — Encounter: Payer: Self-pay | Admitting: Occupational Therapy

## 2019-03-24 ENCOUNTER — Ambulatory Visit: Payer: BC Managed Care – PPO | Admitting: Physical Therapy

## 2019-03-24 DIAGNOSIS — M25571 Pain in right ankle and joints of right foot: Secondary | ICD-10-CM | POA: Diagnosis present

## 2019-03-24 DIAGNOSIS — R29898 Other symptoms and signs involving the musculoskeletal system: Secondary | ICD-10-CM | POA: Insufficient documentation

## 2019-03-24 DIAGNOSIS — R278 Other lack of coordination: Secondary | ICD-10-CM | POA: Insufficient documentation

## 2019-03-24 DIAGNOSIS — R262 Difficulty in walking, not elsewhere classified: Secondary | ICD-10-CM | POA: Insufficient documentation

## 2019-03-24 DIAGNOSIS — M25551 Pain in right hip: Secondary | ICD-10-CM | POA: Insufficient documentation

## 2019-03-24 DIAGNOSIS — M6281 Muscle weakness (generalized): Secondary | ICD-10-CM | POA: Insufficient documentation

## 2019-03-24 NOTE — Therapy (Addendum)
[place] MAIN [facility] SERVICES [address] [place], [place], [address] Phone: [phone]   Fax:  [phone]  Occupational Therapy Treatment  Patient Details  Name: [name] MRN: [id number] Date of Birth: May 27, 1965 Referring Provider (OT): Alma Friendly, MD   Encounter Date: 03/24/2019  OT End of Session - 03/24/19 1559    Visit Number  33    Number of Visits  69    Date for OT Re-Evaluation  06/02/19    Authorization Type  Progress report period starting 11/18/18    OT Start Time  1105    OT Stop Time  1145    OT Time Calculation (min)  40 min    Activity Tolerance  Patient tolerated treatment well;Patient limited by pain    Behavior During Therapy  [facility] for tasks assessed/performed       Past Medical History:  Diagnosis Date  . Allergy   . Amyotrophic lateral sclerosis (ALS) ([place]) 09/23/2018  . Anemia   . Anxiety   . Arthritis   . Asthma   . Colon polyp   . Complication of anesthesia   . Diabetes mellitus   . Edema   . Fatigue   . Hypertension   . IBS (irritable bowel syndrome)   . Lump in female breast   . Methicillin resistant Staphylococcus aureus in conditions classified elsewhere and of unspecified site   . Migraines   . Morbid obesity ([place])   . Night sweats   . Nonspecific abnormal results of thyroid function study   . Other B-complex deficiencies   . Paresthesias   . PONV (postoperative nausea and vomiting)   . Pure hypercholesterolemia   . Reflux   . Sacral fracture ([place])   . Sinus problem   . Symptomatic states associated with artificial menopause   . Thyroid disease   . Type II or unspecified type diabetes mellitus without mention of complication, not stated as uncontrolled   . Unspecified sleep apnea     Past Surgical History:  Procedure Laterality Date  . abcess removal  1997   MRSA  . ABDOMINAL HYSTERECTOMY  1997   complete in 1997, partial was in 1995  . carpal tunnel release r  07/03/13    Dalldorf  . [place]  . CHOLECYSTECTOMY  03/1989  . COLONOSCOPY  03/21/2011   Normal.  [facility]es.  [name] KNEE SURGERY  2006  . LAPAROSCOPIC GASTRIC BANDING  05/2009  . TONSILLECTOMY AND ADENOIDECTOMY  1974  . TUBAL LIGATION    . ULNAR TUNNEL RELEASE Right 02/05/2018   Procedure: CUBITAL TUNNEL RELEASE/DECOMPRESSION;  Surgeon: Melrose Nakayama, MD;  Location: [facility] [facility];  Service: Orthopedics;  Laterality: Right;    There were no vitals filed for this visit.  Subjective Assessment - 03/24/19 1559    Subjective   Pt. reports that her daughter came in from [place] for the holidays.    Patient is accompanied by:  Family member    Patient Stated Goals  I want to work on handwriting, being able to hold a cup again and regain strength and coordination in both hands.    Currently in Pain?  No/denies      OT TREATMENT    Discussed with the patient about modifying pt. treatment session frequency to 1 x a month. Pt. has made progress overall with grip strength, pinch strength, and [place] skills. Pt. was provided with green theraputty for progressing gross grip strength.  Pt. was  provided with a HEP for bilateral [facility] skills.  Measurements were obtained for grip strength, pinch strength, and [place] skills. Pt. continues to be  appropriate for OT services for ADL, and IALD training, UE strengthening, [facility] skills and review compensatory, and work simplification strategies during ADLs, and IADL tasks.     [facility] OT Assessment - 03/24/19 1714      Coordination   Right 9 Hole Peg Test  23    Left 9 Hole Peg Test  24      Hand Function   Right Hand Grip (lbs)  32    Right Hand Lateral Pinch  7 lbs    Right Hand 3 Point Pinch  5 lbs    Left Hand Grip (lbs)  35    Left Hand Lateral Pinch  8 lbs    Left 3 point pinch  5 lbs                       OT Education - 03/24/19 1559    Education Details  Positioning, UE care    Person(s) Educated  Patient    Methods   Explanation;Demonstration;Verbal cues    Comprehension  Verbalized understanding;Returned demonstration          OT Long Term Goals - 03/10/19 1200      OT LONG TERM GOAL #1   Title  Pt will complete self feeding using adaptive utensils and compenstory techniques using R hand.    Baseline  Pt needs help with holding cup, cutting meat and uses L hand as assist.    Time  12    Period  Weeks    Status  Achieved      OT LONG TERM GOAL #2   Title  Pt will complete LB dressing using adaptive equipment with min assist and cues in unsupported sitting.    Baseline  Now able to do her socks, pants but needs help with AFOs and shoes     Time  12    Period  Weeks    Status  On-going    Target Date  06/02/19      OT LONG TERM GOAL #3   Title  Pt will complete toileting and hygiene with adaptive aid with min assist and cues on elevated toilet with use of grab bar and FWW for balance.    Baseline  improving but still requires occasional assist    Time  12    Period  Weeks    Status  On-going    Target Date  06/02/19      OT LONG TERM GOAL #4   Title  Pt will be educated in HEP for strengthening and fine motor coordination.    Baseline  ongoing additions to home program    Time  12    Period  Weeks    Status  On-going    Target Date  06/02/19      OT LONG TERM GOAL #5   Title  Pt will increase strength  in B hands for use during ADLs by 3#    Baseline  exceeded initial goal, revised to 3 additional #    Time  12    Period  Weeks    Status  On-going    Target Date  06/02/19      OT LONG TERM GOAL #6   Title  Pt will increase strength 2# in B pinch for lateral and 3 point for use during ADLs.  Baseline  minimal changes in pinch 12/11/2018    Time  12    Period  Weeks    Status  Achieved      OT LONG TERM GOAL #7   Title  Patient will demonstrate improved fine motor coordination skills to minimize dropping items such as pills    Baseline  drops items frequently    Time  12     Period  Weeks    Status  On-going    Target Date  06/02/19      OT LONG TERM GOAL #8   Title  Patient to demonstrate ability to manipulate buttons with or without use of adaptive equipment.    Baseline  unable to perform buttons and requires assist.    Time  12    Period  Weeks    Status  On-going    Target Date  06/02/19            Plan - 03/24/19 1600    Clinical Impression Statement Discussed with the patient about modifying pt. treatment session frequency to 1 x a month. Pt. has made progress overall with grip strength, pinch strength, and [place] skills. Pt. was provided with green theraputty for progressing gross grip strength.  Pt. was provided with a HEP for bilateral [facility] skills.  Measurements were obtained for grip strength, pinch strength, and [place] skills. Pt. continues to be  appropriate for OT services for ADL, and IALD training, UE strengthening, [facility] skills and review compensatory, and work simplification strategies during ADLs, and IADL tasks.   OT Occupational Profile and History  Detailed Assessment- Review of Records and additional review of physical, cognitive, psychosocial history related to current functional performance    Occupational performance deficits (Please refer to evaluation for details):  ADL's;IADL's;Education;Leisure    Body Structure / Function / Physical Skills  Strength;Gait;FMC;IADL;Balance;ADL;Coordination;Endurance;UE functional use;Mobility    Rehab Potential  Good    Clinical Decision Making  Limited treatment options, no task modification necessary    Comorbidities Affecting Occupational Performance:  Presence of comorbidities impacting occupational performance    Comorbidities impacting occupational performance description:  progressive muscle weakness, balance, coordination    Modification or Assistance to Complete Evaluation   Min-Moderate modification of tasks or assist with assess necessary to complete eval    OT Frequency  2x / week    OT  Duration  12 weeks    OT Treatment/Interventions  Moist Heat;Self-care/ADL training;Therapeutic exercise;Patient/family education;Energy conservation;Therapist, nutritional;Therapeutic activities;Balance training;DME and/or AE instruction;Manual Therapy;Psychosocial skills training    Plan  Rec continued OT 2x per week for 12 weeks    OT Home Exercise Plan  Pt educated in use of ice at home using Dixie cups.    Consulted and Agree with Plan of Care  Patient       Patient will benefit from skilled therapeutic intervention in order to improve the following deficits and impairments:   Body Structure / Function / Physical Skills: Strength, Gait, FMC, IADL, Balance, ADL, Coordination, Endurance, UE functional use, Mobility       Visit Diagnosis: Muscle weakness (generalized)  Other lack of coordination    Problem List Patient Active Problem List   Diagnosis Date Noted  . Nasal mass 01/30/2019  . Vitamin D deficiency 10/29/2018  . Migraine without status migrainosus, not intractable   . Chronic pain syndrome   . Lumbar disc disease with radiculopathy 07/21/2017  . Lumbar foraminal stenosis 07/21/2017  . Unable to walk 07/21/2017  . Multiple falls  07/21/2017  . Displaced fracture of proximal end of right fibula 07/21/2017  . Lower extremity weakness 05/28/2017  . Right lower quadrant abdominal pain 08/22/2016  . Abnormal TSH 01/10/2016  . Morbid obesity ([place]) 01/12/2014  . Routine general medical examination at a health care facility 10/28/2012  . GERD (gastroesophageal reflux disease) 10/28/2012  . Anxiety 12/20/2011  . Allergic rhinitis 12/20/2011  . Asthma 12/20/2011  . Lapband APL with HH repair 03/31/2011  . Uncontrolled type 2 diabetes mellitus with hyperglycemia, with long-term current use of insulin ([place]) 06/30/2007  . Hyperlipidemia 06/30/2007  . Essential hypertension 06/27/2007    Harrel Carina, MS, OTR/L 03/24/2019, 5:16 PM  [place] MAIN [facility] SERVICES [address] [place], [place], [address] Phone: [phone]   Fax:  [phone]  Name: [name] MRN: [id number] Date of Birth: Dec 17, 1965

## 2019-03-26 ENCOUNTER — Other Ambulatory Visit: Payer: Self-pay

## 2019-03-26 ENCOUNTER — Encounter: Payer: Self-pay | Admitting: Physical Therapy

## 2019-03-26 ENCOUNTER — Ambulatory Visit: Payer: BC Managed Care – PPO

## 2019-03-26 ENCOUNTER — Ambulatory Visit: Payer: BC Managed Care – PPO | Admitting: Occupational Therapy

## 2019-03-26 DIAGNOSIS — M25571 Pain in right ankle and joints of right foot: Secondary | ICD-10-CM

## 2019-03-26 DIAGNOSIS — R29898 Other symptoms and signs involving the musculoskeletal system: Secondary | ICD-10-CM

## 2019-03-26 DIAGNOSIS — M6281 Muscle weakness (generalized): Secondary | ICD-10-CM | POA: Diagnosis not present

## 2019-03-26 DIAGNOSIS — R278 Other lack of coordination: Secondary | ICD-10-CM

## 2019-03-26 NOTE — Therapy (Signed)
[place] MAIN [facility] SERVICES [address] [place], [place], [address] Phone: [phone]   Fax:  [phone]  Physical Therapy Treatment  Patient Details  Name: [name] MRN: [id number] Date of Birth: 01/02/1966 Referring Provider (PT): Cornell Barman   Encounter Date: 03/26/2019  PT End of Session - 03/26/19 1310    Visit Number  24    Number of Visits  57    Date for PT Re-Evaluation  05/14/19    PT Start Time  5170    PT Stop Time  1230    PT Time Calculation (min)  45 min    Equipment Utilized During Treatment  Gait belt    Activity Tolerance  Patient tolerated treatment well    Behavior During Therapy  WFL for tasks assessed/performed       Past Medical History:  Diagnosis Date  . Allergy   . Amyotrophic lateral sclerosis (ALS) ([place]) 09/23/2018  . Anemia   . Anxiety   . Arthritis   . Asthma   . Colon polyp   . Complication of anesthesia   . Diabetes mellitus   . Edema   . Fatigue   . Hypertension   . IBS (irritable bowel syndrome)   . Lump in female breast   . Methicillin resistant Staphylococcus aureus in conditions classified elsewhere and of unspecified site   . Migraines   . Morbid obesity ([place])   . Night sweats   . Nonspecific abnormal results of thyroid function study   . Other B-complex deficiencies   . Paresthesias   . PONV (postoperative nausea and vomiting)   . Pure hypercholesterolemia   . Reflux   . Sacral fracture ([place])   . Sinus problem   . Symptomatic states associated with artificial menopause   . Thyroid disease   . Type II or unspecified type diabetes mellitus without mention of complication, not stated as uncontrolled   . Unspecified sleep apnea     Past Surgical History:  Procedure Laterality Date  . abcess removal  1997   MRSA  . ABDOMINAL HYSTERECTOMY  1997   complete in 1997, partial was in 1995  . carpal tunnel release r  07/03/13   Dalldorf  . [place]   . CHOLECYSTECTOMY  03/1989  . COLONOSCOPY  03/21/2011   Normal.  [facility]es.  [name] KNEE SURGERY  2006  . LAPAROSCOPIC GASTRIC BANDING  05/2009  . TONSILLECTOMY AND ADENOIDECTOMY  1974  . TUBAL LIGATION    . ULNAR TUNNEL RELEASE Right 02/05/2018   Procedure: CUBITAL TUNNEL RELEASE/DECOMPRESSION;  Surgeon: Melrose Nakayama, MD;  Location: [place];  Service: Orthopedics;  Laterality: Right;    There were no vitals filed for this visit.  Subjective Assessment - 03/26/19 1308    Subjective  Pt reports her knee and her arm are not bothering her today upon arrival.  She likes her new ankle braces she got a few weeks ago.  She has been working on her HEP with her ankle weights.    Pertinent History   Patient fx her sacrum 9/18 and  she lost sensation to right foot, and she lost ability to DF her right foot. Patient had back surgery 03/2017 , she was sent home. She was walking with no device indoors and she could get out of the house and do the steps. June 18, 2017  her LLE was getting weaker and the right leg was getting stronger. Patient had follow  up with neurosurgery. She was scheduled for a steriod shot Jul 23, 2017, but on May 3 her legs didnt  feel right, she fell 4 times, she fractured her R fibula  on may 3rd, she went to hospital and spent one week. She went to SNF for 1 month. She had another neuro consult, resulting in kDx of  polyneuropathy. Then she had an apt with and [facility] said it was not polyneuropathy, (in [place])  She has a second consultation scheduled Dec 30 for a  Neuromuscular.consult.  She had a scan of the spine T 12-28-17  who then sent her to a thoracic surgeon at the end of November and was recommended to not due surgery. She does not know what her Dx is and she has been having home PT beginning August 25, 2017 . She has had HHPT 2 x  week for the last 5 months. She is able to walk 50 feet with RW. She is able to transfer from wc to stand independently.     How long can you stand comfortably?   5 mins    Patient Stated Goals  to walk without the RW, or walk better, use the bathroom    Currently in Pain?  No/denies    Pain Score  0-No pain    Pain Location  Knee       Today's Treatment:  Nu-step x 7 mins (level 5), seat #10 Amb with RW (PT SBA) x 50 ft  Transfer sit to supine independently and supine to sit independently LAQ: 5# R 2x10, L 2x15 SAQ: 4# R 2x10, L 2x15 Hooklying marches R 2x10 (with PT supporting R LE from excess lateral rotation), L 2x15 Bridges: 2x10 (focus on exhale during lift)  Patient performed with instruction, verbal cues, tactile cues of therapist: goal:increase tissue extensibility, muscle activation, promote proper posture, improve mobility   PT Education - 03/26/19 1309    Education Details  exercise technique    Person(s) Educated  Patient    Methods  Explanation;Tactile cues;Demonstration    Comprehension  Verbalized understanding;Returned demonstration;Tactile cues required       PT Short Term Goals - 03/26/19 1314      PT SHORT TERM GOAL #1   Title  Patient will be independent in home exercise program to improve strength/mobility for better functional independence with ADLs.    Time  6    Period  Weeks    Status  Partially Met    Target Date  09/10/18        PT Long Term Goals - 03/26/19 1315      PT LONG TERM GOAL #1   Title  Patient will increase 10 meter walk test to >.50 m/s as to improve gait speed for better community ambulation and to reduce fall risk.    Baseline  . 36 m/sec,, 10/23/18 =.43 m/ sec, 02/05/19=.49 m/sec    Time  8    Period  Weeks    Status  Partially Met      PT LONG TERM GOAL #2   Title  Patient will transfer sit to stand  from 19 inch mat without assist or anyone supporting AD  to demonstrate improved LE strength to decrease falls risk.     Baseline  02/05/19=    Time  12    Period  Weeks    Status  Achieved      PT LONG TERM GOAL #3   Title  Patient will demonstrate stance without AD  x 1 min to  demonstrate decreased falls risk.     Baseline  02/05/19=She is able to stand for 20 seconds without UE support and 1 min with 1 UE support    Time  12    Period  Weeks    Status  Achieved      PT LONG TERM GOAL #4   Title  Patient will  ambulate 500 feet to work towards community ambulation distances.     Baseline  270 feet, 10/23/18= 305 feet, 02/05/19 = 300 feet    Time  12    Period  Weeks    Status  Partially Met      PT LONG TERM GOAL #5   Title  Patient (< 20 years old) will complete five times sit to stand test in < 10 seconds indicating an increased LE strength and improved balance.    Baseline  20.28 m/sec. 10/23/18 =16.82 ( from green chair)02/05/19=,12.85 sec    Time  12    Period  Weeks    Status  Partially Met      PT LONG TERM GOAL #6   Title  Patient will report a worst pain of 0/10 on VAS in left knee             to improve tolerance with ADLs and reduced symptoms with activities.    Baseline  No pain in knees 02/05/19, 03/25/18 no pain in knees    Time  12    Period  Weeks    Status  Achieved            Plan - 03/26/19 1319    Clinical Impression Statement  Patient was not limited by c/o L knee pain during session today.  She does have more difficulty with R LE strengthening than L- fatigues more easiliy and she requires more rest breaks for R LE exercises.  Pt is able to transfer independently supine to sit today.  She reports the new ankle braces are more comfortable and she was able to keep them on during entire session today.  She would benefit from continued skilled PT to facilitate maximum benefit from PT and maximum progress towards goals.    Rehab Potential  Good    Clinical Impairments Affecting Rehab Potential  Multiple tests concerning for neurologic involvement     PT Frequency  1x / week    PT Duration  8 weeks    PT Treatment/Interventions  Gait training;Therapeutic activities;Therapeutic exercise;Balance training;Neuromuscular  re-education;Patient/family education;Orthotic Fit/Training;Manual techniques    PT Next Visit Plan  Continue with current program.    PT Home Exercise Plan  No updates this date     Consulted and Agree with Plan of Care  Patient    Family Member Consulted  Spouse        Patient will benefit from skilled therapeutic intervention in order to improve the following deficits and impairments:  Decreased balance, Decreased endurance, Decreased mobility, Difficulty walking, Impaired sensation, Decreased range of motion, Decreased activity tolerance, Decreased coordination, Decreased strength, Pain, Postural dysfunction  Visit Diagnosis: Muscle weakness (generalized)  Other lack of coordination  Pain in right ankle and joints of right foot  Weakness of right leg     Problem List Patient Active Problem List   Diagnosis Date Noted  . Nasal mass 01/30/2019  . Vitamin D deficiency 10/29/2018  . Migraine without status migrainosus, not intractable   . Chronic pain syndrome   . Lumbar disc disease with radiculopathy 07/21/2017  .  Lumbar foraminal stenosis 07/21/2017  . Unable to walk 07/21/2017  . Multiple falls 07/21/2017  . Displaced fracture of proximal end of right fibula 07/21/2017  . Lower extremity weakness 05/28/2017  . Right lower quadrant abdominal pain 08/22/2016  . Abnormal TSH 01/10/2016  . Morbid obesity ([place]) 01/12/2014  . Routine general medical examination at a health care facility 10/28/2012  . GERD (gastroesophageal reflux disease) 10/28/2012  . Anxiety 12/20/2011  . Allergic rhinitis 12/20/2011  . Asthma 12/20/2011  . Lapband APL with HH repair 03/31/2011  . Uncontrolled type 2 diabetes mellitus with hyperglycemia, with long-term current use of insulin ([place]) 06/30/2007  . Hyperlipidemia 06/30/2007  . Essential hypertension 06/27/2007    [name] 03/26/2019, 1:30 PM  [name], PT, DPT Physical Therapist - [place]  Outpatient Physical Therapy- [place] MAIN [facility] SERVICES [address] [place], [place], [address] Phone: [phone]   Fax:  [phone]  Name: [name] MRN: [id number] Date of Birth: 25-Nov-1965

## 2019-03-27 ENCOUNTER — Ambulatory Visit
Admission: RE | Admit: 2019-03-27 | Discharge: 2019-03-27 | Disposition: A | Discharge status: Home / Self Care | Payer: BC Managed Care – PPO | Source: Ambulatory Visit | Attending: Primary Care | Admitting: Primary Care

## 2019-03-27 ENCOUNTER — Other Ambulatory Visit: Payer: Self-pay

## 2019-03-27 DIAGNOSIS — Z1231 Encounter for screening mammogram for malignant neoplasm of breast: Secondary | ICD-10-CM

## 2019-03-28 ENCOUNTER — Ambulatory Visit: Payer: BC Managed Care – PPO

## 2019-03-31 ENCOUNTER — Ambulatory Visit: Payer: BC Managed Care – PPO | Admitting: Physical Therapy

## 2019-03-31 ENCOUNTER — Ambulatory Visit: Payer: BC Managed Care – PPO | Admitting: Occupational Therapy

## 2019-03-31 ENCOUNTER — Other Ambulatory Visit: Payer: Self-pay

## 2019-03-31 DIAGNOSIS — M6281 Muscle weakness (generalized): Secondary | ICD-10-CM | POA: Diagnosis not present

## 2019-03-31 DIAGNOSIS — M25551 Pain in right hip: Secondary | ICD-10-CM

## 2019-03-31 DIAGNOSIS — R278 Other lack of coordination: Secondary | ICD-10-CM

## 2019-03-31 DIAGNOSIS — R29898 Other symptoms and signs involving the musculoskeletal system: Secondary | ICD-10-CM

## 2019-03-31 DIAGNOSIS — M25571 Pain in right ankle and joints of right foot: Secondary | ICD-10-CM

## 2019-03-31 DIAGNOSIS — R262 Difficulty in walking, not elsewhere classified: Secondary | ICD-10-CM

## 2019-03-31 NOTE — Therapy (Signed)
[place] MAIN [facility] SERVICES [address] [place], [place], [address] Phone: [phone]   Fax:  [phone]  Physical Therapy Treatment  Patient Details  Name: [name] MRN: [id number] Date of Birth: 08/19/1965 Referring Provider (PT): Cornell Barman   Encounter Date: 03/31/2019  PT End of Session - 03/31/19 1152    Visit Number  48    Number of Visits  27    Date for PT Re-Evaluation  05/14/19    PT Start Time  9169    PT Stop Time  1225    PT Time Calculation (min)  40 min    Equipment Utilized During Treatment  Gait belt    Activity Tolerance  Patient tolerated treatment well    Behavior During Therapy  WFL for tasks assessed/performed       Past Medical History:  Diagnosis Date  . Allergy   . Amyotrophic lateral sclerosis (ALS) ([place]) 09/23/2018  . Anemia   . Anxiety   . Arthritis   . Asthma   . Colon polyp   . Complication of anesthesia   . Diabetes mellitus   . Edema   . Fatigue   . Hypertension   . IBS (irritable bowel syndrome)   . Lump in female breast   . Methicillin resistant Staphylococcus aureus in conditions classified elsewhere and of unspecified site   . Migraines   . Morbid obesity ([place])   . Night sweats   . Nonspecific abnormal results of thyroid function study   . Other B-complex deficiencies   . Paresthesias   . PONV (postoperative nausea and vomiting)   . Pure hypercholesterolemia   . Reflux   . Sacral fracture ([place])   . Sinus problem   . Symptomatic states associated with artificial menopause   . Thyroid disease   . Type II or unspecified type diabetes mellitus without mention of complication, not stated as uncontrolled   . Unspecified sleep apnea     Past Surgical History:  Procedure Laterality Date  . abcess removal  1997   MRSA  . ABDOMINAL HYSTERECTOMY  1997   complete in 1997, partial was in 1995  . carpal tunnel release r  07/03/13   Dalldorf  . [place]  . CHOLECYSTECTOMY  03/1989  . COLONOSCOPY  03/21/2011   Normal.  [facility].  [name] KNEE SURGERY  2006  . LAPAROSCOPIC GASTRIC BANDING  05/2009  . TONSILLECTOMY AND ADENOIDECTOMY  1974  . TUBAL LIGATION    . ULNAR TUNNEL RELEASE Right 02/05/2018   Procedure: CUBITAL TUNNEL RELEASE/DECOMPRESSION;  Surgeon: Melrose Nakayama, MD;  Location: [facility];  Service: Orthopedics;  Laterality: Right;    There were no vitals filed for this visit.  Subjective Assessment - 03/31/19 1151    Subjective  Patient has no new concerns and no falls.    Patient is accompained by:  Family member    Pertinent History   Patient fx her sacrum 9/18 and  she lost sensation to right foot, and she lost ability to DF her right foot. Patient had back surgery 03/2017 , she was sent home. She was walking with no device indoors and she could get out of the house and do the steps. June 18, 2017  her LLE was getting weaker and the right leg was getting stronger. Patient had follow up with neurosurgery. She was scheduled for a steriod shot Jul 23, 2017, but on May 3 her legs didnt  feel right, she fell 4 times, she fractured her R fibula  on may 3rd, she went to hospital and spent one week. She went to SNF for 1 month. She had another neuro consult, resulting in kDx of  polyneuropathy. Then she had an apt with and [facility] said it was not polyneuropathy, (in [place])  She has a second consultation scheduled Dec 30 for a  Neuromuscular.consult.  She had a scan of the spine T 12-28-17  who then sent her to a thoracic surgeon at the end of November and was recommended to not due surgery. She does not know what her Dx is and she has been having home PT beginning August 25, 2017 . She has had HHPT 2 x  week for the last 5 months. She is able to walk 50 feet with RW. She is able to transfer from wc to stand independently.     How long can you stand comfortably?  5 mins    Patient Stated Goals  to walk without the RW, or walk better, use the bathroom     Pain Onset  In the past 7 days    Pain Onset  In the past 7 days       Treatment; sidelying with 2 lbs and LE braces x 10 x 2 Bridging with 5 sec hold x 5 Marching with LE braces and 4 lbs x 20 x 3 sidelying knee flex with braces x 3 LLE Sit to stand x 10  Nu-step x 5 mins with level 5 Pt educated throughout session about proper posture and technique with exercises. Improved exercise technique, movement at target joints, use of target muscles after min to mod verbal, visual, tactile cues.                        PT Education - 03/31/19 1152    Education provided  Yes    Education Details  HEP    Person(s) Educated  Patient    Methods  Explanation;Demonstration    Comprehension  Verbalized understanding       PT Short Term Goals - 03/26/19 1314      PT SHORT TERM GOAL #1   Title  Patient will be independent in home exercise program to improve strength/mobility for better functional independence with ADLs.    Time  6    Period  Weeks    Status  Partially Met    Target Date  09/10/18        PT Long Term Goals - 03/26/19 1315      PT LONG TERM GOAL #1   Title  Patient will increase 10 meter walk test to >.50 m/s as to improve gait speed for better community ambulation and to reduce fall risk.    Baseline  . 36 m/sec,, 10/23/18 =.43 m/ sec, 02/05/19=.49 m/sec    Time  8    Period  Weeks    Status  Partially Met      PT LONG TERM GOAL #2   Title  Patient will transfer sit to stand  from 19 inch mat without assist or anyone supporting AD  to demonstrate improved LE strength to decrease falls risk.     Baseline  02/05/19=    Time  12    Period  Weeks    Status  Achieved      PT LONG TERM GOAL #3   Title  Patient will demonstrate stance without AD  x 1 min  to demonstrate decreased falls risk.     Baseline  02/05/19=She is able to stand for 20 seconds without UE support and 1 min with 1 UE support    Time  12    Period  Weeks    Status  Achieved       PT LONG TERM GOAL #4   Title  Patient will  ambulate 500 feet to work towards community ambulation distances.     Baseline  270 feet, 10/23/18= 305 feet, 02/05/19 = 300 feet    Time  12    Period  Weeks    Status  Partially Met      PT LONG TERM GOAL #5   Title  Patient (< 9 years old) will complete five times sit to stand test in < 10 seconds indicating an increased LE strength and improved balance.    Baseline  20.28 m/sec. 10/23/18 =16.82 ( from green chair)02/05/19=,12.85 sec    Time  12    Period  Weeks    Status  Partially Met      PT LONG TERM GOAL #6   Title  Patient will report a worst pain of 0/10 on VAS in left knee             to improve tolerance with ADLs and reduced symptoms with activities.    Baseline  No pain in knees 02/05/19, 03/25/18 no pain in knees    Time  12    Period  Weeks    Status  Achieved            Plan - 03/31/19 1153    Clinical Impression Statement   Pt was able to perform all exercises today with CGA.[name] Pt was able to perform all balance and strength exercises, demonstrating improvements in LE strength and stability.  .  Pt would continue to benefit from skilled PT services in order to further strengthen LE's, improve static and dynamic balance, and improve coordination in order to increase functional mobility and decrease risk of falls   Rehab Potential  Good    Clinical Impairments Affecting Rehab Potential  Multiple tests concerning for neurologic involvement     PT Frequency  1x / week    PT Duration  8 weeks    PT Treatment/Interventions  Gait training;Therapeutic activities;Therapeutic exercise;Balance training;Neuromuscular re-education;Patient/family education;Orthotic Fit/Training;Manual techniques    PT Next Visit Plan  Continue with current program.    PT Home Exercise Plan  No updates this date     Consulted and Agree with Plan of Care  Patient    Family Member Consulted  Spouse        Patient will benefit from skilled therapeutic  intervention in order to improve the following deficits and impairments:  Decreased balance, Decreased endurance, Decreased mobility, Difficulty walking, Impaired sensation, Decreased range of motion, Decreased activity tolerance, Decreased coordination, Decreased strength, Pain, Postural dysfunction  Visit Diagnosis: Muscle weakness (generalized)  Other lack of coordination  Pain in right ankle and joints of right foot  Weakness of right leg  Difficulty in walking, not elsewhere classified  Pain in right hip     Problem List Patient Active Problem List   Diagnosis Date Noted  . Nasal mass 01/30/2019  . Vitamin D deficiency 10/29/2018  . Migraine without status migrainosus, not intractable   . Chronic pain syndrome   . Lumbar disc disease with radiculopathy 07/21/2017  . Lumbar foraminal stenosis 07/21/2017  . Unable to walk 07/21/2017  . Multiple falls  07/21/2017  . Displaced fracture of proximal end of right fibula 07/21/2017  . Lower extremity weakness 05/28/2017  . Right lower quadrant abdominal pain 08/22/2016  . Abnormal TSH 01/10/2016  . Morbid obesity ([place]) 01/12/2014  . Routine general medical examination at a health care facility 10/28/2012  . GERD (gastroesophageal reflux disease) 10/28/2012  . Anxiety 12/20/2011  . Allergic rhinitis 12/20/2011  . Asthma 12/20/2011  . Lapband APL with HH repair 03/31/2011  . Uncontrolled type 2 diabetes mellitus with hyperglycemia, with long-term current use of insulin ([place]) 06/30/2007  . Hyperlipidemia 06/30/2007  . Essential hypertension 06/27/2007    Alanson Puls, [place] DPT 03/31/2019, 11:54 AM  [place] MAIN [facility] SERVICES [address] [place], [place], [address] Phone: [phone]   Fax:  [phone]  Name: [name] MRN: [id number] Date of Birth: 1965-10-09

## 2019-04-02 ENCOUNTER — Encounter: Payer: BC Managed Care – PPO | Admitting: Occupational Therapy

## 2019-04-02 ENCOUNTER — Ambulatory Visit: Payer: BC Managed Care – PPO | Admitting: Physical Therapy

## 2019-04-04 ENCOUNTER — Other Ambulatory Visit: Payer: Self-pay | Admitting: Primary Care

## 2019-04-04 DIAGNOSIS — E559 Vitamin D deficiency, unspecified: Secondary | ICD-10-CM

## 2019-04-06 ENCOUNTER — Telehealth: Payer: BC Managed Care – PPO | Admitting: Physician Assistant

## 2019-04-06 DIAGNOSIS — Z20818 Contact with and (suspected) exposure to other bacterial communicable diseases: Secondary | ICD-10-CM | POA: Diagnosis not present

## 2019-04-06 DIAGNOSIS — J020 Streptococcal pharyngitis: Secondary | ICD-10-CM | POA: Diagnosis not present

## 2019-04-06 MED ORDER — AZITHROMYCIN 250 MG PO TABS: ORAL_TABLET | ORAL | 0 refills | Status: DC

## 2019-04-06 NOTE — Progress Notes (Signed)
I have spent 5 minutes in review of e-visit questionnaire, review and updating patient chart, medical decision making and response to patient.   Kischa Altice Cody Melvine Julin, PA-C    

## 2019-04-06 NOTE — Progress Notes (Signed)
We are sorry that you are not feeling well.  Here is how we plan to help!  Based on what you have shared with me it is likely that you have strep pharyngitis.  Strep pharyngitis is inflammation and infection in the back of the throat.  This is an infection cause by bacteria and is treated with antibiotics.  I have prescribed Azithromycin 250 mg two tablets today and then one daily for 4 additional days. For throat pain, we recommend over the counter oral pain relief medications such as acetaminophen or aspirin, or anti-inflammatory medications such as ibuprofen or naproxen sodium. Topical treatments such as oral throat lozenges or sprays may be used as needed. Strep infections are not as easily transmitted as other respiratory infections, however we still recommend that you avoid close contact with loved ones, especially the very young and elderly.  Remember to wash your hands thoroughly throughout the day as this is the number one way to prevent the spread of infection and wipe down door knobs and counters with disinfectant.   Home Care:  Only take medications as instructed by your medical team.  Complete the entire course of an antibiotic.  Do not take these medications with alcohol.  A steam or ultrasonic humidifier can help congestion.  You can place a towel over your head and breathe in the steam from hot water coming from a faucet.  Avoid close contacts especially the very young and the elderly.  Cover your mouth when you cough or sneeze.  Always remember to wash your hands.  Get Help Right Away If:  You develop worsening fever or sinus pain.  You develop a severe head ache or visual changes.  Your symptoms persist after you have completed your treatment plan.  Make sure you  Understand these instructions.  Will watch your condition.  Will get help right away if you are not doing well or get worse.  Your e-visit answers were reviewed by a board certified advanced clinical  practitioner to complete your personal care plan.  Depending on the condition, your plan could have included both over the counter or prescription medications.  If there is a problem please reply  once you have received a response from your provider.  Your safety is important to us.  If you have drug allergies check your prescription carefully.    You can use MyChart to ask questions about today's visit, request a non-urgent call back, or ask for a work or school excuse for 24 hours related to this e-Visit. If it has been greater than 24 hours you will need to follow up with your provider, or enter a new e-Visit to address those concerns.  You will get an e-mail in the next two days asking about your experience.  I hope that your e-visit has been valuable and will speed your recovery. Thank you for using e-visits.   

## 2019-04-07 ENCOUNTER — Ambulatory Visit: Payer: BC Managed Care – PPO | Admitting: Physical Therapy

## 2019-04-07 ENCOUNTER — Ambulatory Visit: Payer: BC Managed Care – PPO | Admitting: Occupational Therapy

## 2019-04-09 ENCOUNTER — Encounter: Payer: BC Managed Care – PPO | Admitting: Occupational Therapy

## 2019-04-09 ENCOUNTER — Ambulatory Visit: Payer: BC Managed Care – PPO | Admitting: Physical Therapy

## 2019-04-11 ENCOUNTER — Other Ambulatory Visit: Payer: Self-pay | Admitting: Primary Care

## 2019-04-11 DIAGNOSIS — M5116 Intervertebral disc disorders with radiculopathy, lumbar region: Secondary | ICD-10-CM

## 2019-04-11 DIAGNOSIS — E1140 Type 2 diabetes mellitus with diabetic neuropathy, unspecified: Secondary | ICD-10-CM

## 2019-04-11 DIAGNOSIS — F419 Anxiety disorder, unspecified: Secondary | ICD-10-CM

## 2019-04-12 ENCOUNTER — Other Ambulatory Visit: Payer: Self-pay | Admitting: Primary Care

## 2019-04-12 DIAGNOSIS — I10 Essential (primary) hypertension: Secondary | ICD-10-CM

## 2019-04-12 DIAGNOSIS — R000 Tachycardia, unspecified: Secondary | ICD-10-CM

## 2019-04-14 ENCOUNTER — Ambulatory Visit: Payer: BC Managed Care – PPO | Admitting: Physical Therapy

## 2019-04-14 ENCOUNTER — Ambulatory Visit: Payer: BC Managed Care – PPO | Admitting: Occupational Therapy

## 2019-04-16 ENCOUNTER — Ambulatory Visit: Payer: BC Managed Care – PPO | Admitting: Physical Therapy

## 2019-04-16 ENCOUNTER — Ambulatory Visit: Payer: BC Managed Care – PPO | Admitting: Occupational Therapy

## 2019-04-21 ENCOUNTER — Ambulatory Visit: Payer: BC Managed Care – PPO | Admitting: Physical Therapy

## 2019-04-21 ENCOUNTER — Encounter: Payer: Self-pay | Admitting: Occupational Therapy

## 2019-04-21 ENCOUNTER — Other Ambulatory Visit: Payer: Self-pay

## 2019-04-21 ENCOUNTER — Ambulatory Visit: Payer: BC Managed Care – PPO | Attending: Primary Care | Admitting: Occupational Therapy

## 2019-04-21 DIAGNOSIS — R278 Other lack of coordination: Secondary | ICD-10-CM

## 2019-04-21 DIAGNOSIS — M25571 Pain in right ankle and joints of right foot: Secondary | ICD-10-CM | POA: Insufficient documentation

## 2019-04-21 DIAGNOSIS — R29898 Other symptoms and signs involving the musculoskeletal system: Secondary | ICD-10-CM | POA: Diagnosis present

## 2019-04-21 DIAGNOSIS — M6281 Muscle weakness (generalized): Secondary | ICD-10-CM | POA: Insufficient documentation

## 2019-04-21 DIAGNOSIS — M25551 Pain in right hip: Secondary | ICD-10-CM | POA: Insufficient documentation

## 2019-04-21 DIAGNOSIS — R262 Difficulty in walking, not elsewhere classified: Secondary | ICD-10-CM | POA: Diagnosis present

## 2019-04-21 NOTE — Therapy (Signed)
[place] MAIN [facility] SERVICES [address] [place], [place], [address] Phone: [phone]   Fax:  [phone]  Physical Therapy Treatment  Patient Details  Name: [name] MRN: [id number] Date of Birth: 12-21-1965 Referring Provider (PT): Cornell Barman   Encounter Date: 04/21/2019  PT End of Session - 04/21/19 1219    Visit Number  22    Number of Visits  57    Date for PT Re-Evaluation  05/14/19    PT Start Time  8889    PT Stop Time  1225    PT Time Calculation (min)  40 min    Equipment Utilized During Treatment  Gait belt    Activity Tolerance  Patient tolerated treatment well    Behavior During Therapy  WFL for tasks assessed/performed       Past Medical History:  Diagnosis Date  . Allergy   . Amyotrophic lateral sclerosis (ALS) ([place]) 09/23/2018  . Anemia   . Anxiety   . Arthritis   . Asthma   . Colon polyp   . Complication of anesthesia   . Diabetes mellitus   . Edema   . Fatigue   . Hypertension   . IBS (irritable bowel syndrome)   . Lump in female breast   . Methicillin resistant Staphylococcus aureus in conditions classified elsewhere and of unspecified site   . Migraines   . Morbid obesity ([place])   . Night sweats   . Nonspecific abnormal results of thyroid function study   . Other B-complex deficiencies   . Paresthesias   . PONV (postoperative nausea and vomiting)   . Pure hypercholesterolemia   . Reflux   . Sacral fracture ([place])   . Sinus problem   . Symptomatic states associated with artificial menopause   . Thyroid disease   . Type II or unspecified type diabetes mellitus without mention of complication, not stated as uncontrolled   . Unspecified sleep apnea     Past Surgical History:  Procedure Laterality Date  . abcess removal  1997   MRSA  . ABDOMINAL HYSTERECTOMY  1997   complete in 1997, partial was in 1995  . carpal tunnel release r  07/03/13   [facility]orf  . [place]   . CHOLECYSTECTOMY  03/1989  . COLONOSCOPY  03/21/2011   Normal.  [facility].  [name] KNEE SURGERY  2006  . LAPAROSCOPIC GASTRIC BANDING  05/2009  . TONSILLECTOMY AND ADENOIDECTOMY  1974  . TUBAL LIGATION    . ULNAR TUNNEL RELEASE Right 02/05/2018   Procedure: CUBITAL TUNNEL RELEASE/DECOMPRESSION;  Surgeon: Melrose Nakayama, MD;  Location: [employer];  Service: Orthopedics;  Laterality: Right;    There were no vitals filed for this visit.  Subjective Assessment - 04/21/19 1220    Subjective  Patient has 1/10 B knees today.    Patient is accompained by:  Family member    Pertinent History   Patient fx her sacrum 9/18 and  she lost sensation to right foot, and she lost ability to DF her right foot. Patient had back surgery 03/2017 , she was sent home. She was walking with no device indoors and she could get out of the house and do the steps. June 18, 2017  her LLE was getting weaker and the right leg was getting stronger. Patient had follow up with neurosurgery. She was scheduled for a steriod shot Jul 23, 2017, but on May 3 her legs didnt  feel right,  she fell 4 times, she fractured her R fibula  on may 3rd, she went to hospital and spent one week. She went to SNF for 1 month. She had another neuro consult, resulting in kDx of  polyneuropathy. Then she had an apt with and [facility] said it was not polyneuropathy, (in [place])  She has a second consultation scheduled Dec 30 for a  Neuromuscular.consult.  She had a scan of the spine T 12-28-17  who then sent her to a thoracic surgeon at the end of November and was recommended to not due surgery. She does not know what her Dx is and she has been having home PT beginning August 25, 2017 . She has had HHPT 2 x  week for the last 5 months. She is able to walk 50 feet with RW. She is able to transfer from wc to stand independently.     How long can you stand comfortably?  5 mins    Patient Stated Goals  to walk without the RW, or walk better, use the bathroom    Currently in  Pain?  Yes    Pain Score  1     Pain Location  Knee    Pain Orientation  Right;Left    Pain Descriptors / Indicators  Aching    Pain Type  Acute pain    Pain Onset  In the past 7 days    Pain Onset  In the past 7 days      Treatment: Standing hip extension with RTB x 20 x 2  Side stepping in parallel bars x 20 feet x 2 with RTB Standing without UE assist and tossing balls from low surface to bin 10 feet away, CGA Pt educated throughout session about proper posture and technique with exercises. Improved exercise technique, movement at target joints, use of target muscles after min to mod verbal, visual, tactile cues.                         PT Education - 04/21/19 1221    Education provided  Yes    Education Details  HEP    Person(s) Educated  Patient    Methods  Explanation;Demonstration    Comprehension  Verbalized understanding;Returned demonstration;Verbal cues required;Tactile cues required;Need further instruction       PT Short Term Goals - 03/26/19 1314      PT SHORT TERM GOAL #1   Title  Patient will be independent in home exercise program to improve strength/mobility for better functional independence with ADLs.    Time  6    Period  Weeks    Status  Partially Met    Target Date  09/10/18        PT Long Term Goals - 03/26/19 1315      PT LONG TERM GOAL #1   Title  Patient will increase 10 meter walk test to >.50 m/s as to improve gait speed for better community ambulation and to reduce fall risk.    Baseline  . 36 m/sec,, 10/23/18 =.43 m/ sec, 02/05/19=.49 m/sec    Time  8    Period  Weeks    Status  Partially Met      PT LONG TERM GOAL #2   Title  Patient will transfer sit to stand  from 19 inch mat without assist or anyone supporting AD  to demonstrate improved LE strength to decrease falls risk.     Baseline  02/05/19=  Time  12    Period  Weeks    Status  Achieved      PT LONG TERM GOAL #3   Title  Patient will demonstrate  stance without AD  x 1 min to demonstrate decreased falls risk.     Baseline  02/05/19=She is able to stand for 20 seconds without UE support and 1 min with 1 UE support    Time  12    Period  Weeks    Status  Achieved      PT LONG TERM GOAL #4   Title  Patient will  ambulate 500 feet to work towards community ambulation distances.     Baseline  270 feet, 10/23/18= 305 feet, 02/05/19 = 300 feet    Time  12    Period  Weeks    Status  Partially Met      PT LONG TERM GOAL #5   Title  Patient (< 36 years old) will complete five times sit to stand test in < 10 seconds indicating an increased LE strength and improved balance.    Baseline  20.28 m/sec. 10/23/18 =16.82 ( from green chair)02/05/19=,12.85 sec    Time  12    Period  Weeks    Status  Partially Met      PT LONG TERM GOAL #6   Title  Patient will report a worst pain of 0/10 on VAS in left knee             to improve tolerance with ADLs and reduced symptoms with activities.    Baseline  No pain in knees 02/05/19, 03/25/18 no pain in knees    Time  12    Period  Weeks    Status  Achieved            Plan - 04/21/19 1219    Clinical Impression Statement  Patient requires verbal cueing and CGA during all dynamic balance activities. Patient required occasional rest breaks but her overall endurance improved between this session and the previous one. Patient's performance during dynamic standing tasks, also greatly improved with minimal loss of balance after patient educated on forward weight shift. Patient will continue to benefit from skilled therapy in order to decrease risk for falls and improve dynamic standing balance.   Rehab Potential  Good    Clinical Impairments Affecting Rehab Potential  Multiple tests concerning for neurologic involvement     PT Frequency  1x / week    PT Duration  8 weeks    PT Treatment/Interventions  Gait training;Therapeutic activities;Therapeutic exercise;Balance training;Neuromuscular  re-education;Patient/family education;Orthotic Fit/Training;Manual techniques    PT Next Visit Plan  Continue with current program.    PT Home Exercise Plan  No updates this date     Consulted and Agree with Plan of Care  Patient    Family Member Consulted  Spouse        Patient will benefit from skilled therapeutic intervention in order to improve the following deficits and impairments:  Decreased balance, Decreased endurance, Decreased mobility, Difficulty walking, Impaired sensation, Decreased range of motion, Decreased activity tolerance, Decreased coordination, Decreased strength, Pain, Postural dysfunction  Visit Diagnosis: Muscle weakness (generalized)  Other lack of coordination  Pain in right ankle and joints of right foot  Weakness of right leg  Difficulty in walking, not elsewhere classified  Pain in right hip     Problem List Patient Active Problem List   Diagnosis Date Noted  . Nasal mass 01/30/2019  .  Vitamin D deficiency 10/29/2018  . Migraine without status migrainosus, not intractable   . Chronic pain syndrome   . Lumbar disc disease with radiculopathy 07/21/2017  . Lumbar foraminal stenosis 07/21/2017  . Unable to walk 07/21/2017  . Multiple falls 07/21/2017  . Displaced fracture of proximal end of right fibula 07/21/2017  . Lower extremity weakness 05/28/2017  . Right lower quadrant abdominal pain 08/22/2016  . Abnormal TSH 01/10/2016  . Morbid obesity ([place]) 01/12/2014  . Routine general medical examination at a health care facility 10/28/2012  . GERD (gastroesophageal reflux disease) 10/28/2012  . Anxiety 12/20/2011  . Allergic rhinitis 12/20/2011  . Asthma 12/20/2011  . Lapband APL with HH repair 03/31/2011  . Uncontrolled type 2 diabetes mellitus with hyperglycemia, with long-term current use of insulin ([place]) 06/30/2007  . Hyperlipidemia 06/30/2007  . Essential hypertension 06/27/2007    Alanson Puls, PT DPT 04/21/2019, 12:22  PM  [place] MAIN [facility] SERVICES [address] [place], [place], [address] Phone: [phone]   Fax:  [phone]  Name: JANIYLAH HANNIS MRN: [id number] Date of Birth: 1965/12/24

## 2019-04-21 NOTE — Therapy (Signed)
[place] MAIN [facility] SERVICES [address] [place], [place], [address] Phone: [phone]   Fax:  [phone]  Occupational Therapy Treatment  Patient Details  Name: [name] MRN: [id number] Date of Birth: 1965/05/20 Referring Provider (OT): Alma Friendly, MD   Encounter Date: 04/21/2019  OT End of Session - 04/21/19 1313    Visit Number  34    Number of Visits  40    Date for OT Re-Evaluation  06/02/19    Authorization Type  Progress report period starting 11/18/18    OT Start Time  1103    OT Stop Time  1143    OT Time Calculation (min)  40 min    Activity Tolerance  Patient tolerated treatment well;Patient limited by pain    Behavior During Therapy  [facility] for tasks assessed/performed       Past Medical History:  Diagnosis Date  . Allergy   . Amyotrophic lateral sclerosis (ALS) ([place]) 09/23/2018  . Anemia   . Anxiety   . Arthritis   . Asthma   . Colon polyp   . Complication of anesthesia   . Diabetes mellitus   . Edema   . Fatigue   . Hypertension   . IBS (irritable bowel syndrome)   . Lump in female breast   . Methicillin resistant Staphylococcus aureus in conditions classified elsewhere and of unspecified site   . Migraines   . Morbid obesity ([place])   . Night sweats   . Nonspecific abnormal results of thyroid function study   . Other B-complex deficiencies   . Paresthesias   . PONV (postoperative nausea and vomiting)   . Pure hypercholesterolemia   . Reflux   . Sacral fracture ([place])   . Sinus problem   . Symptomatic states associated with artificial menopause   . Thyroid disease   . Type II or unspecified type diabetes mellitus without mention of complication, not stated as uncontrolled   . Unspecified sleep apnea     Past Surgical History:  Procedure Laterality Date  . abcess removal  1997   MRSA  . ABDOMINAL HYSTERECTOMY  1997   complete in 1997, partial was in 1995  . carpal tunnel release r  07/03/13   Dalldorf  . [place]  . CHOLECYSTECTOMY  03/1989  . COLONOSCOPY  03/21/2011   Normal.  [facility].  [name] KNEE SURGERY  2006  . LAPAROSCOPIC GASTRIC BANDING  05/2009  . TONSILLECTOMY AND ADENOIDECTOMY  1974  . TUBAL LIGATION    . ULNAR TUNNEL RELEASE Right 02/05/2018   Procedure: CUBITAL TUNNEL RELEASE/DECOMPRESSION;  Surgeon: Melrose Nakayama, MD;  Location: [facility];  Service: Orthopedics;  Laterality: Right;    There were no vitals filed for this visit.  Subjective Assessment - 04/21/19 1107    Subjective  Pt. reports that she is doing well today. Pt. Reports that her husband has made her things at home for her to continue working on with her hands.   Pertinent History  [name] was recently diagnosed with ALS at [facility].  She has had a recent decrease in strength in L side of body, UE and LE with decreased coordination.  She feels like she is dropping cups more and needs to use both hands for hand to mouth and handwriting has decreased to almost illegible.    Limitations  weakness    Patient Stated Goals  I want to work on handwriting, being able to  hold a cup again and regain strength and coordination in both hands.    Currently in Pain?  No/denies         [facility] OT Assessment - 04/21/19 1109      Coordination   Right 9 Hole Peg Test  21 sec.    Left 9 Hole Peg Test  23 sec.      Hand Function   Right Hand Grip (lbs)  34    Right Hand Lateral Pinch  7 lbs    Right Hand 3 Point Pinch  6 lbs    Left Hand Grip (lbs)  28    Left Hand Lateral Pinch  8 lbs    Left 3 point pinch  5 lbs      Measurements were obtained for bilateral grip strength, pinch strength, and Linda skills.   OT TREATMENT     Therapeutic Exercise:  Pt. performed gross gripping with grip strengthener. Pt. worked on sustaining grip while grasping pegs and reaching at various heights. The gripper was set at 17.9# of grip strength resistance. Pt. Completed one set with the right hand,  and 2 sets with the left hand. Pt. worked on Counsellor with the left hand. Pt. education was provided about  Options for grip strengthening exercises at home.  Response to Treatment:  Pt. has made progress with right grip strength, pinch strength, and Cand[place]as skills. Pt. had a 7# decrease in the left grip strength,  Left  pinch strength remained relatively the same. [facility] skills have improved. Pt. to focus on left grip strengthening exercises. Pt. continues to work on improving overall UE strength, and [facility] skills as well as reviewing adaptive techniques, and work simplification strategies to improve, and maximize independence with ADLs, and IADLs.                          OT Long Term Goals - 03/10/19 1200      OT LONG TERM GOAL #1   Title  Pt will complete self feeding using adaptive utensils and compenstory techniques using R hand.    Baseline  Pt needs help with holding cup, cutting meat and uses L hand as assist.    Time  12    Period  Weeks    Status  Achieved      OT LONG TERM GOAL #2   Title  Pt will complete LB dressing using adaptive equipment with min assist and cues in unsupported sitting.    Baseline  Now able to do her socks, pants but needs help with AFOs and shoes     Time  12    Period  Weeks    Status  On-going    Target Date  06/02/19      OT LONG TERM GOAL #3   Title  Pt will complete toileting and hygiene with adaptive aid with min assist and cues on elevated toilet with use of grab bar and FWW for balance.    Baseline  improving but still requires occasional assist    Time  12    Period  Weeks    Status  On-going    Target Date  06/02/19      OT LONG TERM GOAL #4   Title  Pt will be educated in HEP for strengthening and fine motor coordination.    Baseline  ongoing additions to home program    Time  12    Period  Weeks    Status  On-going    Target Date  06/02/19      OT LONG TERM GOAL #5   Title  Pt will increase  strength  in B hands for use during ADLs by 3#    Baseline  exceeded initial goal, revised to 3 additional #    Time  12    Period  Weeks    Status  On-going    Target Date  06/02/19      OT LONG TERM GOAL #6   Title  Pt will increase strength 2# in B pinch for lateral and 3 point for use during ADLs.    Baseline  minimal changes in pinch 12/11/2018    Time  12    Period  Weeks    Status  Achieved      OT LONG TERM GOAL #7   Title  Patient will demonstrate improved fine motor coordination skills to minimize dropping items such as pills    Baseline  drops items frequently    Time  12    Period  Weeks    Status  On-going    Target Date  06/02/19      OT LONG TERM GOAL #8   Title  Patient to demonstrate ability to manipulate buttons with or without use of adaptive equipment.    Baseline  unable to perform buttons and requires assist.    Time  12    Period  Weeks    Status  On-going    Target Date  06/02/19            Plan - 04/21/19 1303    Clinical Impression Statement  Pt. has made progress with right grip strength, pinch strength, and [place] skills. Pt. had a 7# decrease in the left grip strength,  Left pinch strength remained the same. [facility] skills have improved. Pt. to focus on left grip strengthening exercises. Pt. continues to work on improving overall UE strength, and [facility] skills as well as reviewing adaptive techniques, and work simplification strategies to improve, and maximize independence with ADLs, and IADLs.    OT Occupational Profile and History  Detailed Assessment- Review of Records and additional review of physical, cognitive, psychosocial history related to current functional performance    Occupational performance deficits (Please refer to evaluation for details):  ADL's;IADL's;Education;Leisure    Body Structure / Function / Physical Skills  Strength;Gait;FMC;IADL;Balance;ADL;Coordination;Endurance;UE functional use;Mobility    Clinical Decision Making  Limited  treatment options, no task modification necessary    Comorbidities Affecting Occupational Performance:  Presence of comorbidities impacting occupational performance    Modification or Assistance to Complete Evaluation   Min-Moderate modification of tasks or assist with assess necessary to complete eval    OT Frequency  2x / week    OT Duration  12 weeks    OT Treatment/Interventions  Moist Heat;Self-care/ADL training;Therapeutic exercise;Patient/family education;Energy conservation;Therapist, nutritional;Therapeutic activities;Balance training;DME and/or AE instruction;Manual Therapy;Psychosocial skills training    OT Home Exercise Plan  Pt educated in use of ice at home using Dixie cups.    Consulted and Agree with Plan of Care  Patient       Patient will benefit from skilled therapeutic intervention in order to improve the following deficits and impairments:   Body Structure / Function / Physical Skills: Strength, Gait, FMC, IADL, Balance, ADL, Coordination, Endurance, UE functional use, Mobility       Visit Diagnosis: Muscle weakness (generalized)  Other lack of coordination  Problem List Patient Active Problem List   Diagnosis Date Noted  . Nasal mass 01/30/2019  . Vitamin D deficiency 10/29/2018  . Migraine without status migrainosus, not intractable   . Chronic pain syndrome   . Lumbar disc disease with radiculopathy 07/21/2017  . Lumbar foraminal stenosis 07/21/2017  . Unable to walk 07/21/2017  . Multiple falls 07/21/2017  . Displaced fracture of proximal end of right fibula 07/21/2017  . Lower extremity weakness 05/28/2017  . Right lower quadrant abdominal pain 08/22/2016  . Abnormal TSH 01/10/2016  . Morbid obesity ([place]) 01/12/2014  . Routine general medical examination at a health care facility 10/28/2012  . GERD (gastroesophageal reflux disease) 10/28/2012  . Anxiety 12/20/2011  . Allergic rhinitis 12/20/2011  . Asthma 12/20/2011  . Lapband APL with HH  repair 03/31/2011  . Uncontrolled type 2 diabetes mellitus with hyperglycemia, with long-term current use of insulin ([place]) 06/30/2007  . Hyperlipidemia 06/30/2007  . Essential hypertension 06/27/2007    Harrel Carina, MS, OTR/L 04/21/2019, 1:15 PM  [place] MAIN [facility] SERVICES [address] [place], [place], [address] Phone: [phone]   Fax:  [phone]  Name: [name] MRN: [id number] Date of Birth: Aug 13, 1965

## 2019-04-23 ENCOUNTER — Ambulatory Visit: Payer: BC Managed Care – PPO | Admitting: Physical Therapy

## 2019-04-23 ENCOUNTER — Ambulatory Visit: Payer: BC Managed Care – PPO | Admitting: Occupational Therapy

## 2019-04-28 ENCOUNTER — Ambulatory Visit: Payer: BC Managed Care – PPO | Admitting: Occupational Therapy

## 2019-04-28 ENCOUNTER — Ambulatory Visit: Payer: BC Managed Care – PPO | Admitting: Physical Therapy

## 2019-04-30 ENCOUNTER — Ambulatory Visit: Payer: BC Managed Care – PPO | Admitting: Occupational Therapy

## 2019-04-30 ENCOUNTER — Ambulatory Visit: Payer: BC Managed Care – PPO | Admitting: Physical Therapy

## 2019-05-05 ENCOUNTER — Ambulatory Visit: Payer: BC Managed Care – PPO | Admitting: Occupational Therapy

## 2019-05-05 ENCOUNTER — Ambulatory Visit: Payer: BC Managed Care – PPO | Admitting: Physical Therapy

## 2019-05-05 ENCOUNTER — Other Ambulatory Visit: Payer: Self-pay

## 2019-05-05 ENCOUNTER — Encounter: Payer: Self-pay | Admitting: Physical Therapy

## 2019-05-05 DIAGNOSIS — R262 Difficulty in walking, not elsewhere classified: Secondary | ICD-10-CM

## 2019-05-05 DIAGNOSIS — R278 Other lack of coordination: Secondary | ICD-10-CM

## 2019-05-05 DIAGNOSIS — M25571 Pain in right ankle and joints of right foot: Secondary | ICD-10-CM

## 2019-05-05 DIAGNOSIS — M25551 Pain in right hip: Secondary | ICD-10-CM

## 2019-05-05 DIAGNOSIS — R29898 Other symptoms and signs involving the musculoskeletal system: Secondary | ICD-10-CM

## 2019-05-05 DIAGNOSIS — M6281 Muscle weakness (generalized): Secondary | ICD-10-CM

## 2019-05-05 NOTE — Therapy (Signed)
[place] MAIN [facility] SERVICES [address] [place], [place], [address] Phone: [phone]   Fax:  [phone]  Physical Therapy Treatment Physical Therapy Progress Note   Dates of reporting period  02/05/20  to  05/05/19  Patient Details  Name: [name] MRN: [id number] Date of Birth: 02/12/1966 Referring Provider (PT): Cornell Barman   Encounter Date: 05/05/2019  PT End of Session - 05/05/19 1155    Visit Number  50    Number of Visits  94    Date for PT Re-Evaluation  05/14/19    PT Start Time  8676    PT Stop Time  1225    PT Time Calculation (min)  40 min    Equipment Utilized During Treatment  Gait belt    Activity Tolerance  Patient tolerated treatment well    Behavior During Therapy  WFL for tasks assessed/performed       Past Medical History:  Diagnosis Date  . Allergy   . Amyotrophic lateral sclerosis (ALS) ([place]) 09/23/2018  . Anemia   . Anxiety   . Arthritis   . Asthma   . Colon polyp   . Complication of anesthesia   . Diabetes mellitus   . Edema   . Fatigue   . Hypertension   . IBS (irritable bowel syndrome)   . Lump in female breast   . Methicillin resistant Staphylococcus aureus in conditions classified elsewhere and of unspecified site   . Migraines   . Morbid obesity ([place])   . Night sweats   . Nonspecific abnormal results of thyroid function study   . Other B-complex deficiencies   . Paresthesias   . PONV (postoperative nausea and vomiting)   . Pure hypercholesterolemia   . Reflux   . Sacral fracture ([place])   . Sinus problem   . Symptomatic states associated with artificial menopause   . Thyroid disease   . Type II or unspecified type diabetes mellitus without mention of complication, not stated as uncontrolled   . Unspecified sleep apnea     Past Surgical History:  Procedure Laterality Date  . abcess removal  1997   MRSA  . ABDOMINAL HYSTERECTOMY  1997   complete in 1997, partial was in 1995   . carpal tunnel release r  07/03/13   [place]al[place]orf  . [place]  . CHOLECYSTECTOMY  03/1989  . COLONOSCOPY  03/21/2011   Normal.  [facility].  [name] KNEE SURGERY  2006  . LAPAROSCOPIC GASTRIC BANDING  05/2009  . TONSILLECTOMY AND ADENOIDECTOMY  1974  . TUBAL LIGATION    . ULNAR TUNNEL RELEASE Right 02/05/2018   Procedure: CUBITAL TUNNEL RELEASE/DECOMPRESSION;  Surgeon: Melrose Nakayama, MD;  Location: [facility];  Service: Orthopedics;  Laterality: Right;    There were no vitals filed for this visit.  Subjective Assessment - 05/05/19 1154    Subjective  Patient has 4/10 B knees today. Her feet feel like they have lightening in them.    Patient is accompained by:  Family member    Pertinent History   Patient fx her sacrum 9/18 and  she lost sensation to right foot, and she lost ability to DF her right foot. Patient had back surgery 03/2017 , she was sent home. She was walking with no device indoors and she could get out of the house and do the steps. June 18, 2017  her LLE was getting weaker and the right leg was getting stronger. Patient  had follow up with neurosurgery. She was scheduled for a steriod shot Jul 23, 2017, but on May 3 her legs didnt  feel right, she fell 4 times, she fractured her R fibula  on may 3rd, she went to hospital and spent one week. She went to SNF for 1 month. She had another neuro consult, resulting in kDx of  polyneuropathy. Then she had an apt with and [facility] said it was not polyneuropathy, (in [name])  She has a second consultation scheduled Dec 30 for a  Neuromuscular.consult.  She had a scan of the spine T 12-28-17  who then sent her to a thoracic surgeon at the end of November and was recommended to not due surgery. She does not know what her Dx is and she has been having home PT beginning August 25, 2017 . She has had HHPT 2 x  week for the last 5 months. She is able to walk 50 feet with RW. She is able to transfer from wc to stand independently.     How long can  you stand comfortably?  5 mins    Patient Stated Goals  to walk without the RW, or walk better, use the bathroom    Currently in Pain?  Yes    Pain Score  4     Pain Location  Knee    Pain Orientation  Right;Left    Pain Descriptors / Indicators  Aching    Pain Type  Chronic pain    Pain Onset  In the past 7 days    Multiple Pain Sites  --   feet are 4/10 pain bilaterally   Pain Onset  In the past 7 days       Treatment: Goals were addressed and reviewed including : 6 MW, 10 MW, 5 x sit to stand  Therapeutic exercise; Hip abd with RTB x 15 x 2 BLE Hip extension with RTB x 15 x 2 BLE Marching without weights x 10 x 2  Sit to stand x 5 reps from 17 inch chair Pt educated throughout session about proper posture and technique with exercises. Improved exercise technique, movement at target joints, use of target muscles after min to mod verbal, visual, tactile cues.                        PT Education - 05/05/19 1155    Education provided  Yes    Education Details  plan of  care    Person(s) Educated  Patient    Methods  Explanation       PT Short Term Goals - 03/26/19 1314      PT SHORT TERM GOAL #1   Title  Patient will be independent in home exercise program to improve strength/mobility for better functional independence with ADLs.    Time  6    Period  Weeks    Status  Partially Met    Target Date  09/10/18        PT Long Term Goals - 05/05/19 1156      PT LONG TERM GOAL #1   Title  Patient will increase 10 meter walk test to >.50 m/s as to improve gait speed for better community ambulation and to reduce fall risk.    Baseline  . 36 m/sec,, 10/23/18 =.43 m/ sec, 02/05/19=.49 m/sec, 05/05/19= 49/m/sec    Time  8    Period  Weeks    Status  Partially Met  Target Date  07/02/19      PT LONG TERM GOAL #2   Title  Patient will transfer sit to stand  from 19 inch mat without assist or anyone supporting AD  to demonstrate improved LE strength to  decrease falls risk.     Baseline  02/05/19=    Time  12    Period  Weeks    Status  Achieved    Target Date  05/26/19      PT LONG TERM GOAL #3   Title  Patient will demonstrate stance without AD  x 1 min to demonstrate decreased falls risk.     Baseline  02/05/19=She is able to stand for 20 seconds without UE support and 1 min with 1 UE support    Time  12    Period  Weeks    Status  Achieved      PT LONG TERM GOAL #4   Title  Patient will  ambulate 500 feet to work towards community ambulation distances.     Baseline  270 feet, 10/23/18= 305 feet, 02/05/19 = 300 feet: 05/05/19= 325 feet    Time  12    Period  Weeks    Status  Partially Met    Target Date  07/02/19      PT LONG TERM GOAL #5   Title  Patient (< 34 years old) will complete five times sit to stand test in < 10 seconds indicating an increased LE strength and improved balance.    Baseline  20.28 m/sec. 10/23/18 =16.82 ( from green chair)02/05/19=,12.85 sec, 05/05/19=11.47sec at 17 inches    Time  12    Period  Weeks    Status  Partially Met    Target Date  07/02/19      PT LONG TERM GOAL #6   Title  Patient will report a worst pain of 0/10 on VAS in left knee             to improve tolerance with ADLs and reduced symptoms with activities.    Baseline  No pain in knees 02/05/19, 03/25/18 no pain in knees2/15/21= 4/10 B knees    Time  12    Period  Weeks    Status  Achieved            Plan - 05/05/19 1156    Clinical Impression Statement  Patient's condition has the potential to improve in response to therapy. Maximum improvement is yet to be obtained. The anticipated improvement is attainable and reasonable in a generally predictable time.  Patient reports that she can get up from the commode better and can get into her truck, and climb steps easier.  Pt was able to perform all exercises today with CGA.[name] Pt was able to perform all balance and strength exercises, demonstrating improvements in LE strength and  stability.  .  Pt requires verbal, visual and tactile cues during exercise in order to complete tasks with proper form and technique.  Pt would continue to benefit from skilled PT services in order to further strengthen LE's, improve static and dynamic balance, and improve coordination in order to increase functional mobility and decrease risk of falls   Rehab Potential  Good    Clinical Impairments Affecting Rehab Potential  Multiple tests concerning for neurologic involvement     PT Frequency  1x / week    PT Duration  8 weeks    PT Treatment/Interventions  Gait training;Therapeutic activities;Therapeutic exercise;Balance training;Neuromuscular re-education;Patient/family education;Orthotic Fit/Training;Manual  techniques    PT Next Visit Plan  Continue with current program.    PT Home Exercise Plan  No updates this date     Consulted and Agree with Plan of Care  Patient    Family Member Consulted  Spouse        Patient will benefit from skilled therapeutic intervention in order to improve the following deficits and impairments:  Decreased balance, Decreased endurance, Decreased mobility, Difficulty walking, Impaired sensation, Decreased range of motion, Decreased activity tolerance, Decreased coordination, Decreased strength, Pain, Postural dysfunction  Visit Diagnosis: Muscle weakness (generalized) - Plan: PT plan of care cert/re-cert  Other lack of coordination - Plan: PT plan of care cert/re-cert  Pain in right ankle and joints of right foot - Plan: PT plan of care cert/re-cert  Weakness of right leg - Plan: PT plan of care cert/re-cert  Difficulty in walking, not elsewhere classified - Plan: PT plan of care cert/re-cert  Pain in right hip - Plan: PT plan of care cert/re-cert     Problem List Patient Active Problem List   Diagnosis Date Noted  . Nasal mass 01/30/2019  . Vitamin D deficiency 10/29/2018  . Migraine without status migrainosus, not intractable   . Chronic pain  syndrome   . Lumbar disc disease with radiculopathy 07/21/2017  . Lumbar foraminal stenosis 07/21/2017  . Unable to walk 07/21/2017  . Multiple falls 07/21/2017  . Displaced fracture of proximal end of right fibula 07/21/2017  . Lower extremity weakness 05/28/2017  . Right lower quadrant abdominal pain 08/22/2016  . Abnormal TSH 01/10/2016  . Morbid obesity ([place]) 01/12/2014  . Routine general medical examination at a health care facility 10/28/2012  . GERD (gastroesophageal reflux disease) 10/28/2012  . Anxiety 12/20/2011  . Allergic rhinitis 12/20/2011  . Asthma 12/20/2011  . Lapband APL with HH repair 03/31/2011  . Uncontrolled type 2 diabetes mellitus with hyperglycemia, with long-term current use of insulin ([place]) 06/30/2007  . Hyperlipidemia 06/30/2007  . Essential hypertension 06/27/2007    Alanson Puls, [place] DPT 05/05/2019, 1:38 PM  [place] MAIN [facility] SERVICES [address] [place], [place], [address] Phone: [phone]   Fax:  [phone]  Name: [name] MRN: [id number] Date of Birth: [birth year]

## 2019-05-07 ENCOUNTER — Ambulatory Visit: Payer: BC Managed Care – PPO | Admitting: Physical Therapy

## 2019-05-07 ENCOUNTER — Ambulatory Visit: Payer: BC Managed Care – PPO | Admitting: Occupational Therapy

## 2019-05-12 ENCOUNTER — Ambulatory Visit: Payer: BC Managed Care – PPO | Admitting: Occupational Therapy

## 2019-05-12 ENCOUNTER — Ambulatory Visit: Payer: BC Managed Care – PPO | Attending: Internal Medicine

## 2019-05-12 ENCOUNTER — Ambulatory Visit: Payer: BC Managed Care – PPO | Admitting: Physical Therapy

## 2019-05-12 DIAGNOSIS — Z23 Encounter for immunization: Secondary | ICD-10-CM

## 2019-05-12 NOTE — Progress Notes (Addendum)
   Covid-19 Vaccination Clinic  Name:  [name]    MRN: [id number] DOB: 09/13/1965  05/12/2019  [name] was observed post Covid-19 immunization for 30 minutes without incidence. She was provided with Vaccine Information Sheet and instruction to access the V-Safe system.   [name] was instructed to call 911 with any severe reactions post vaccine: [name] Difficulty breathing  . Swelling of your face and throat  . A fast heartbeat  . A bad rash all over your body  . Dizziness and weakness    Immunizations Administered    Name Date Dose VIS Date Route   Moderna COVID-19 Vaccine 05/12/2019 12:48 PM 0.5 mL 02/18/2019 Intramuscular   Manufacturer: Moderna   Lot: [id number]   [place]:[id number]

## 2019-05-14 ENCOUNTER — Ambulatory Visit: Payer: BC Managed Care – PPO | Admitting: Physical Therapy

## 2019-05-14 ENCOUNTER — Ambulatory Visit: Payer: BC Managed Care – PPO | Admitting: Occupational Therapy

## 2019-05-16 ENCOUNTER — Other Ambulatory Visit: Payer: Self-pay | Admitting: Primary Care

## 2019-05-16 DIAGNOSIS — Z794 Long term (current) use of insulin: Secondary | ICD-10-CM

## 2019-05-16 DIAGNOSIS — E119 Type 2 diabetes mellitus without complications: Secondary | ICD-10-CM

## 2019-05-19 ENCOUNTER — Ambulatory Visit: Payer: BC Managed Care – PPO | Admitting: Occupational Therapy

## 2019-05-21 ENCOUNTER — Other Ambulatory Visit: Payer: Self-pay | Admitting: Primary Care

## 2019-05-21 DIAGNOSIS — I10 Essential (primary) hypertension: Secondary | ICD-10-CM

## 2019-05-27 ENCOUNTER — Other Ambulatory Visit: Payer: Self-pay | Admitting: Primary Care

## 2019-05-27 DIAGNOSIS — J3489 Other specified disorders of nose and nasal sinuses: Secondary | ICD-10-CM

## 2019-05-29 ENCOUNTER — Encounter: Payer: Self-pay | Admitting: Physical Therapy

## 2019-06-02 ENCOUNTER — Ambulatory Visit: Payer: BC Managed Care – PPO | Admitting: Physical Therapy

## 2019-06-06 ENCOUNTER — Other Ambulatory Visit: Payer: Self-pay | Admitting: Primary Care

## 2019-06-06 DIAGNOSIS — Z794 Long term (current) use of insulin: Secondary | ICD-10-CM

## 2019-06-06 DIAGNOSIS — E1165 Type 2 diabetes mellitus with hyperglycemia: Secondary | ICD-10-CM

## 2019-06-10 ENCOUNTER — Other Ambulatory Visit: Payer: Self-pay

## 2019-06-10 ENCOUNTER — Ambulatory Visit: Payer: BC Managed Care – PPO | Attending: Internal Medicine

## 2019-06-10 DIAGNOSIS — Z23 Encounter for immunization: Secondary | ICD-10-CM

## 2019-06-10 NOTE — Progress Notes (Signed)
   Covid-19 Vaccination Clinic  Name:  [name]    MRN: [id number] DOB: 1965/03/21  06/10/2019  [name] was observed post Covid-19 immunization for 15 minutes without incident. She was provided with Vaccine Information Sheet and instruction to access the V-Safe system.   [name] was instructed to call 911 with any severe reactions post vaccine: [name] Difficulty breathing  . Swelling of face and throat  . A fast heartbeat  . A bad rash all over body  . Dizziness and weakness   Immunizations Administered    Name Date Dose VIS Date Route   Moderna COVID-19 Vaccine 06/10/2019  2:49 PM 0.5 mL 02/18/2019 Intramuscular   Manufacturer: Levan Hurst   LotUT:[id number]   [place]:[id number]

## 2019-06-12 ENCOUNTER — Other Ambulatory Visit: Payer: Self-pay | Admitting: Primary Care

## 2019-06-12 DIAGNOSIS — I10 Essential (primary) hypertension: Secondary | ICD-10-CM

## 2019-06-17 ENCOUNTER — Ambulatory Visit: Payer: BC Managed Care – PPO | Admitting: Physical Therapy

## 2019-06-24 ENCOUNTER — Other Ambulatory Visit: Payer: Self-pay | Admitting: Primary Care

## 2019-06-24 DIAGNOSIS — M5116 Intervertebral disc disorders with radiculopathy, lumbar region: Secondary | ICD-10-CM

## 2019-06-24 NOTE — Telephone Encounter (Signed)
Last prescribed on 04/14/2019 . Last appointment on 01/30/2019. No future appointment

## 2019-06-24 NOTE — Telephone Encounter (Signed)
Is she still taking the cyclobenzaprine? How often? This is quite a lot of medication to be taking and to be out so soon. Let me know.

## 2019-06-25 ENCOUNTER — Other Ambulatory Visit: Payer: Self-pay

## 2019-06-25 DIAGNOSIS — M5116 Intervertebral disc disorders with radiculopathy, lumbar region: Secondary | ICD-10-CM

## 2019-06-26 NOTE — Telephone Encounter (Signed)
Refill sent to pharmacy.   

## 2019-06-26 NOTE — Telephone Encounter (Signed)
Spoken and notified patient of [name] comments.   Yes, patient is still taking. She is taking 3 times a day but a couple days out of the week she would take 2 times a day, it depends on the pain level.

## 2019-06-28 ENCOUNTER — Other Ambulatory Visit: Payer: Self-pay | Admitting: Primary Care

## 2019-06-28 DIAGNOSIS — Z794 Long term (current) use of insulin: Secondary | ICD-10-CM

## 2019-06-28 DIAGNOSIS — E119 Type 2 diabetes mellitus without complications: Secondary | ICD-10-CM

## 2019-06-30 ENCOUNTER — Ambulatory Visit: Payer: BC Managed Care – PPO | Admitting: Physical Therapy

## 2019-06-30 ENCOUNTER — Encounter: Payer: BC Managed Care – PPO | Admitting: Occupational Therapy

## 2019-07-09 IMAGING — DX DG KNEE COMPLETE 4+V*L*
4 series · 4 of 4 positions shown · non-contrast
Comparison: None.

CLINICAL DATA: Left knee pain after multiple falls.

EXAM:
LEFT KNEE - COMPLETE 4+ VIEW

[knee ap]
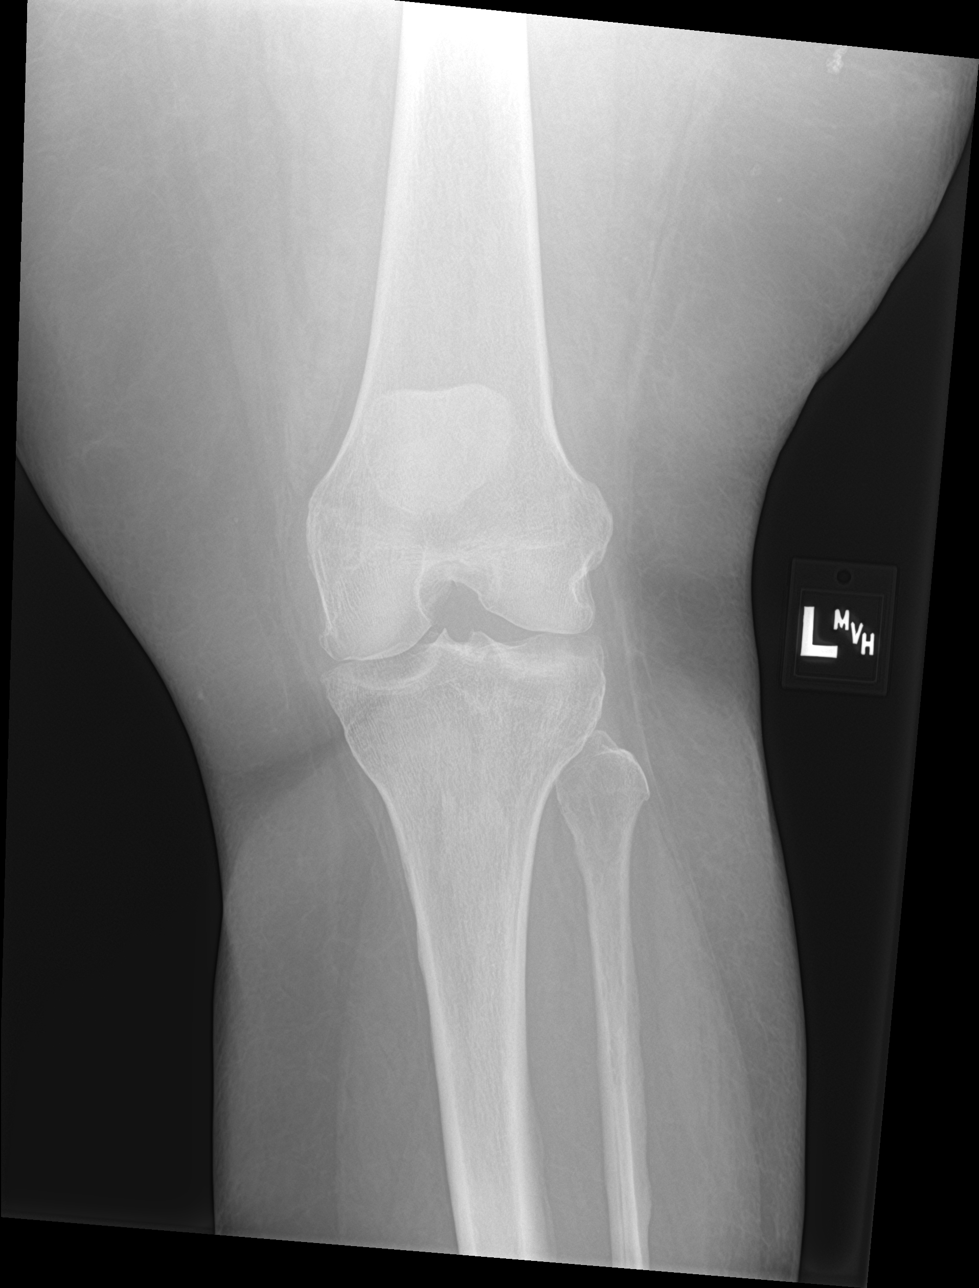

[knee lat]
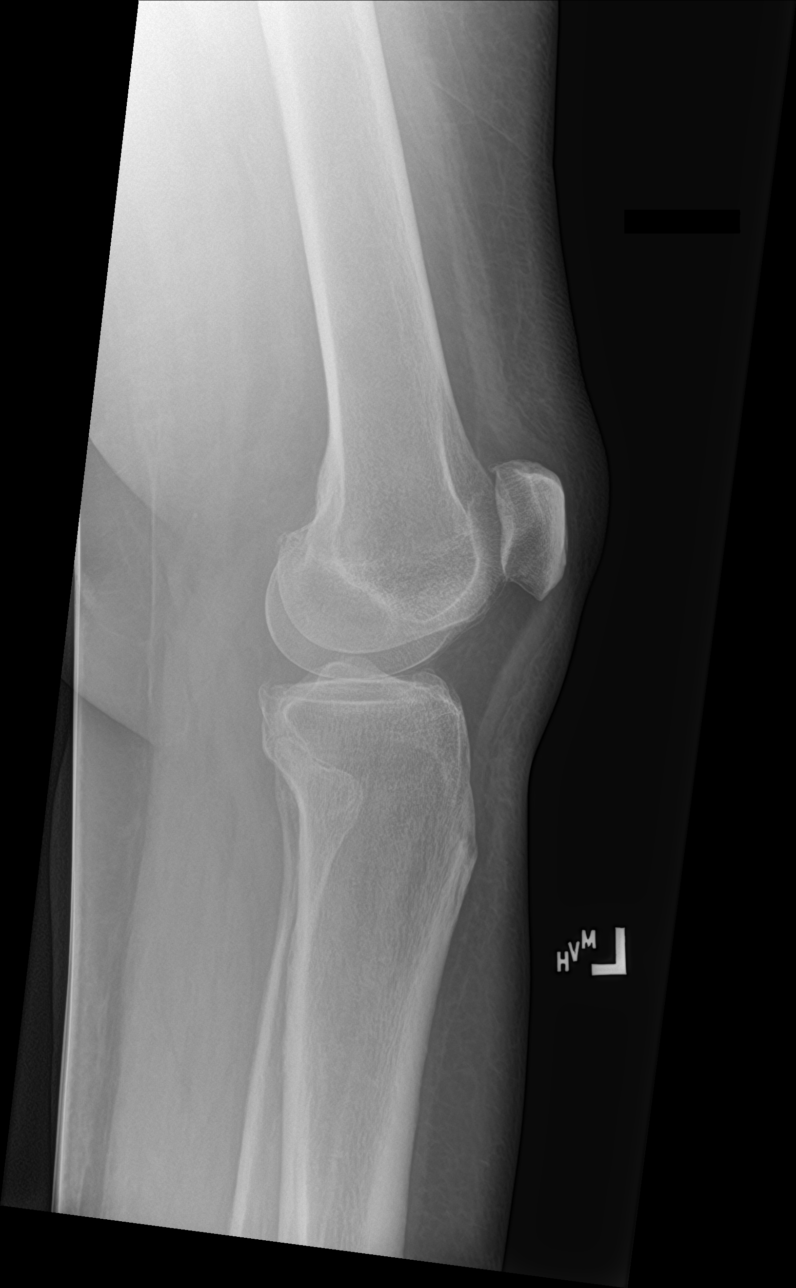

[knee obl (1 of 2)]
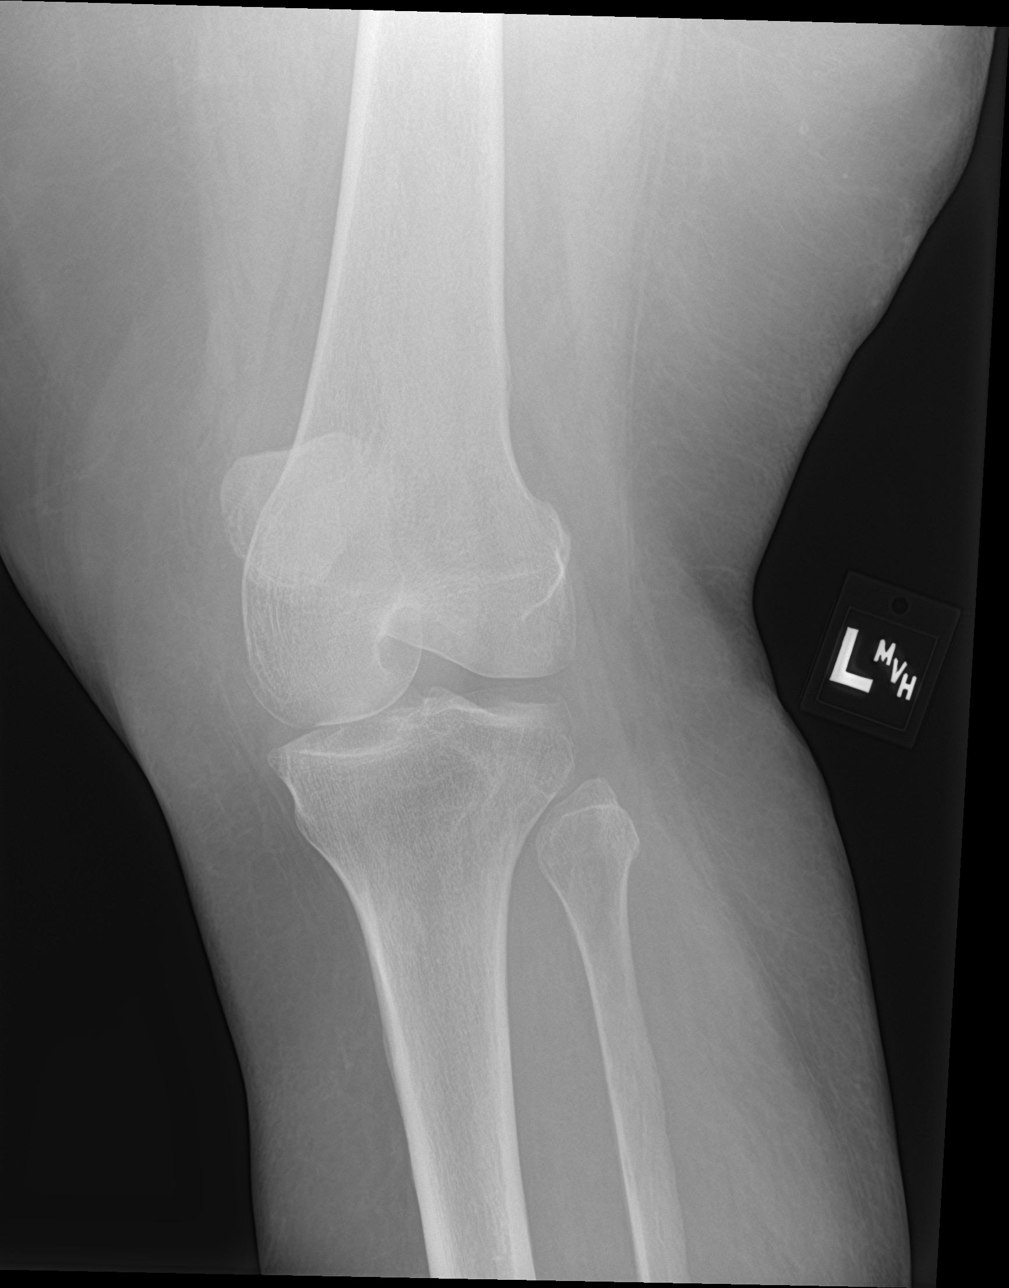

[knee obl (2 of 2)]
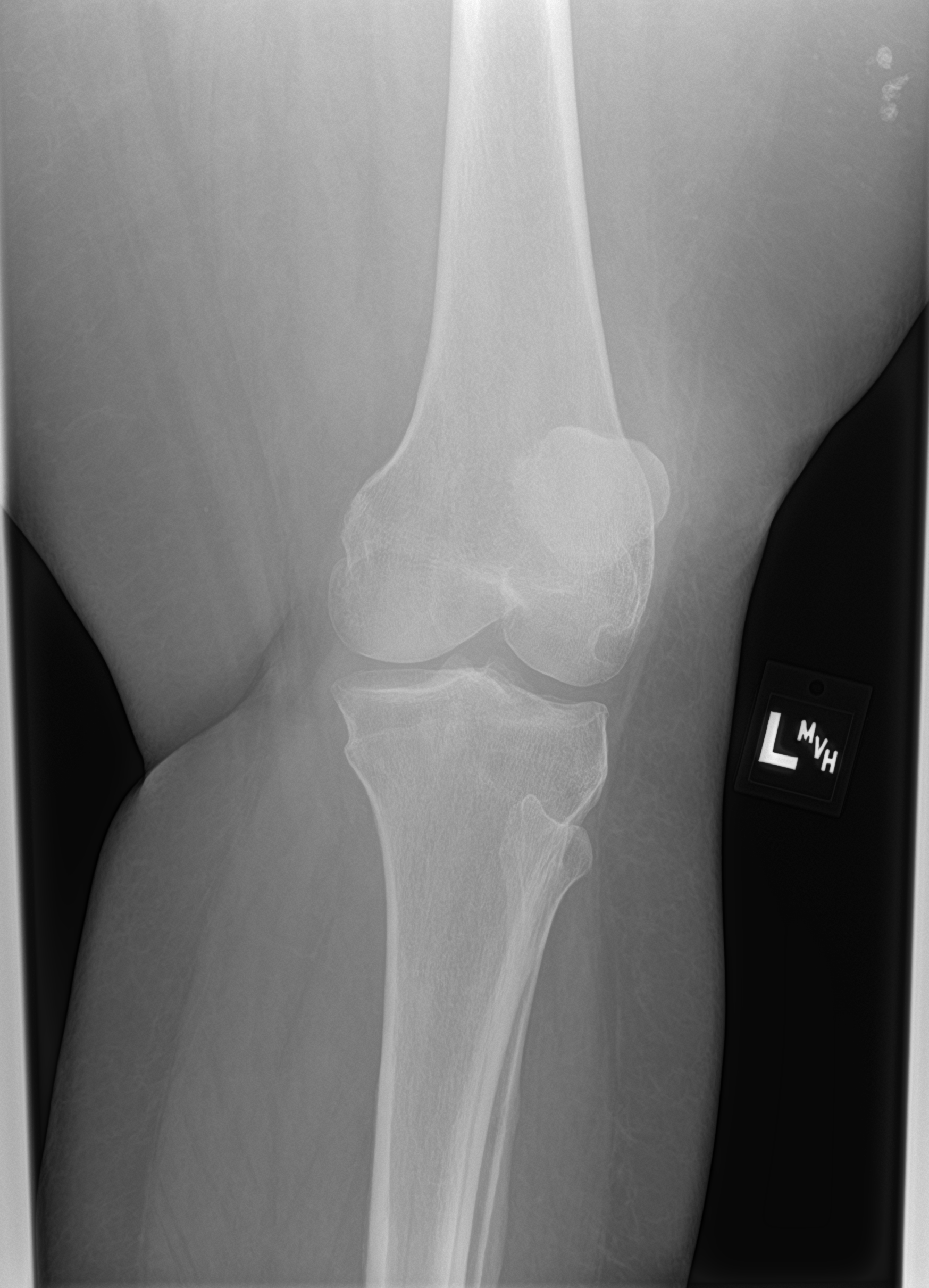

[4 of 4 positions shown; findings below may reference images not displayed]

FINDINGS: No evidence of fracture, dislocation, or joint effusion. No evidence
of arthropathy or other focal bone abnormality. Soft tissues are
unremarkable.
IMPRESSION: Normal left knee.

## 2019-07-09 IMAGING — DX DG KNEE COMPLETE 4+V*R*
6 series · 6 of 6 positions shown · non-contrast
Comparison: None.

CLINICAL DATA: Right knee pain after multiple falls.

EXAM:
RIGHT KNEE - COMPLETE 4+ VIEW

[knee ap]
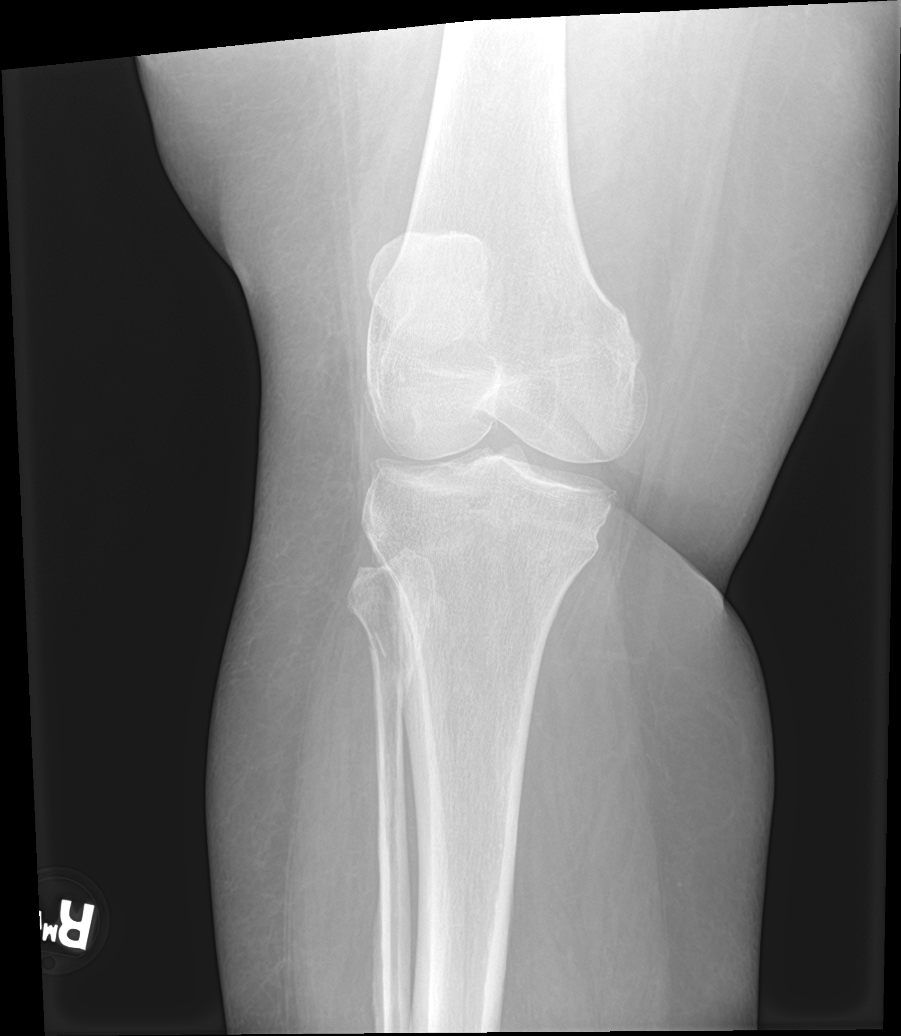

[knee lat (1 of 2)]
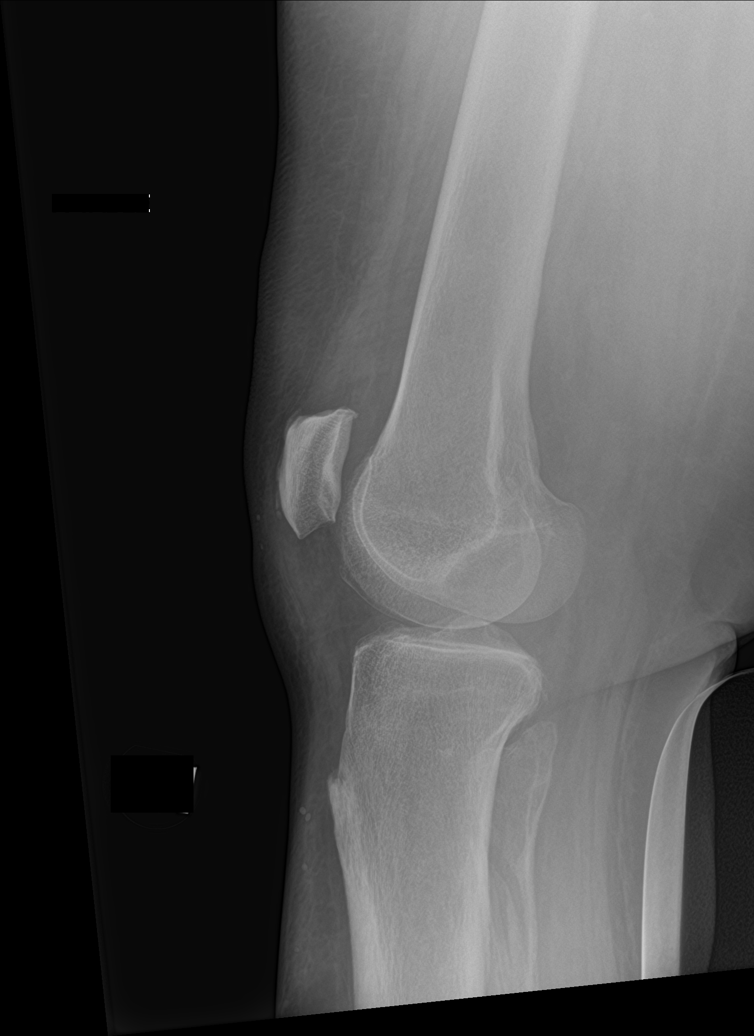

[knee obl (1 of 3)]
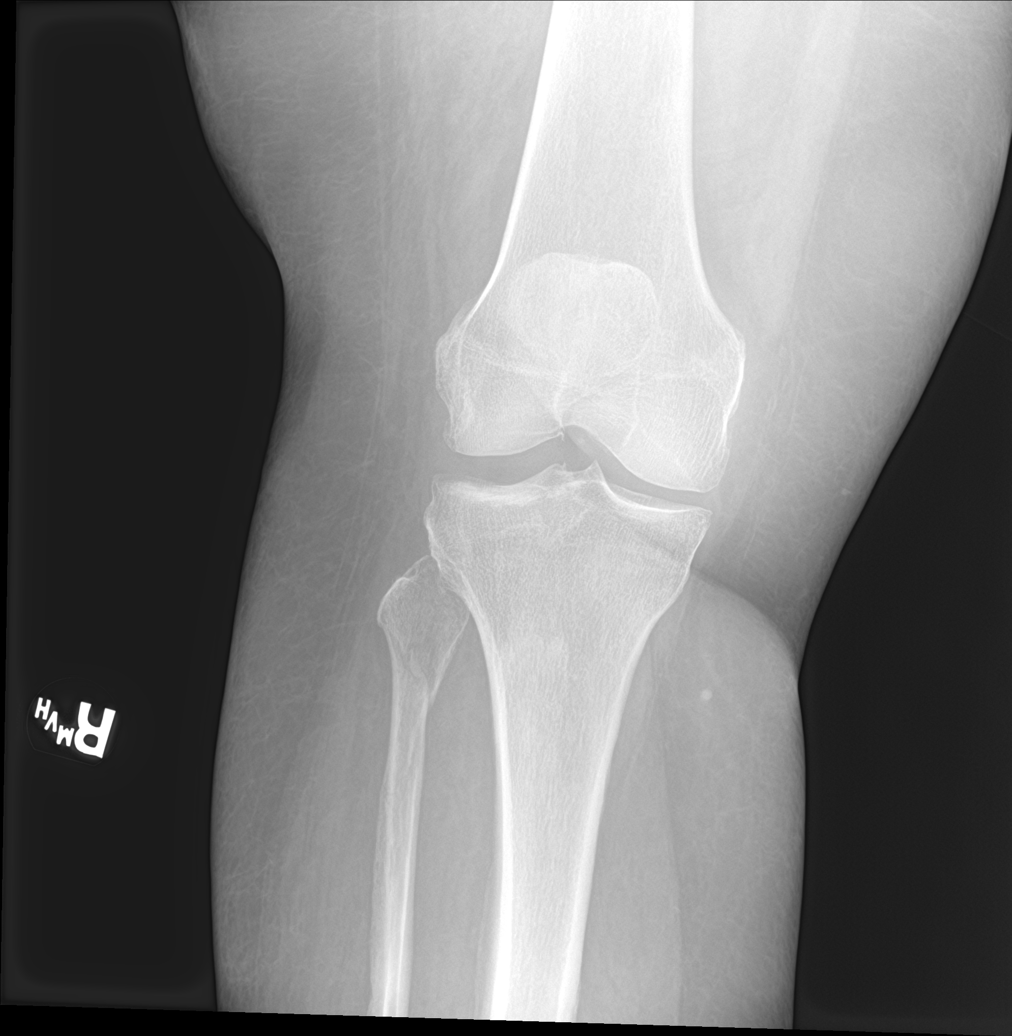

[knee obl (2 of 3)]
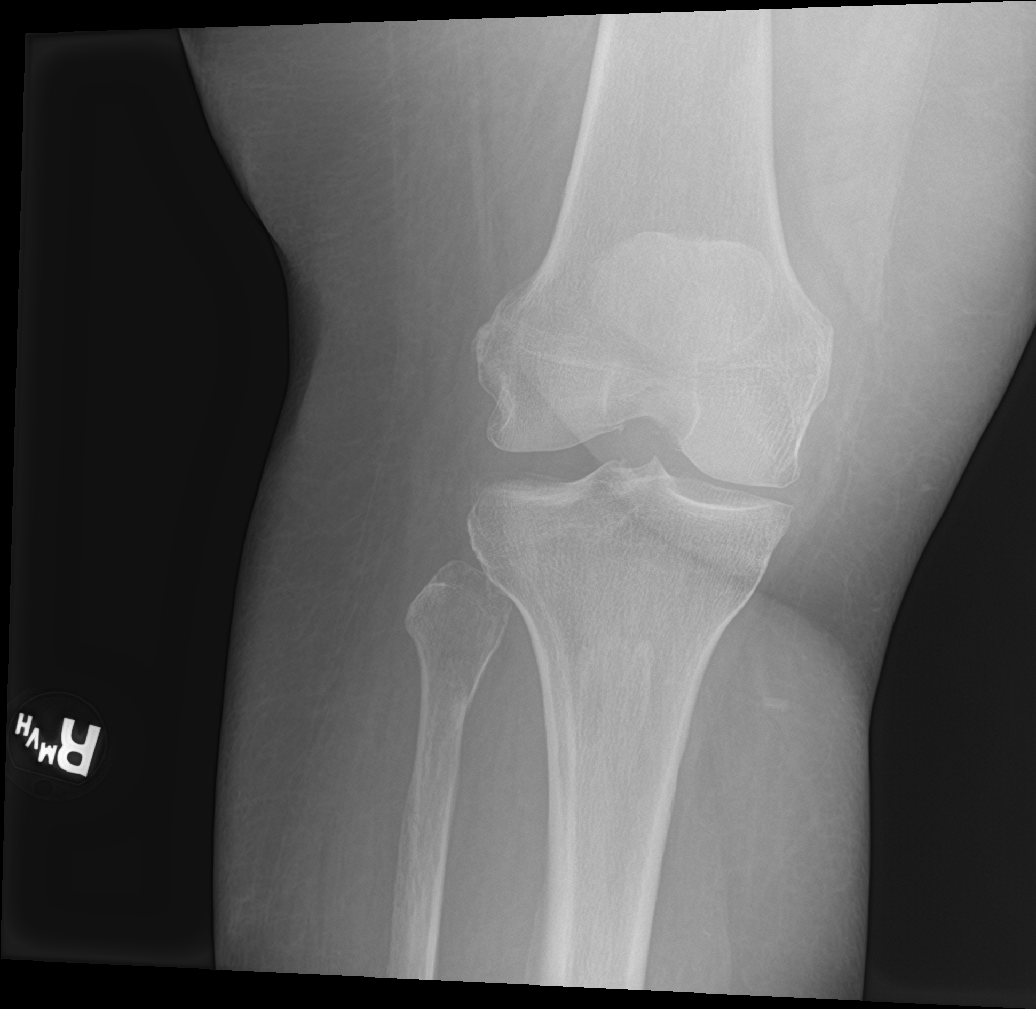

[knee obl (3 of 3)]
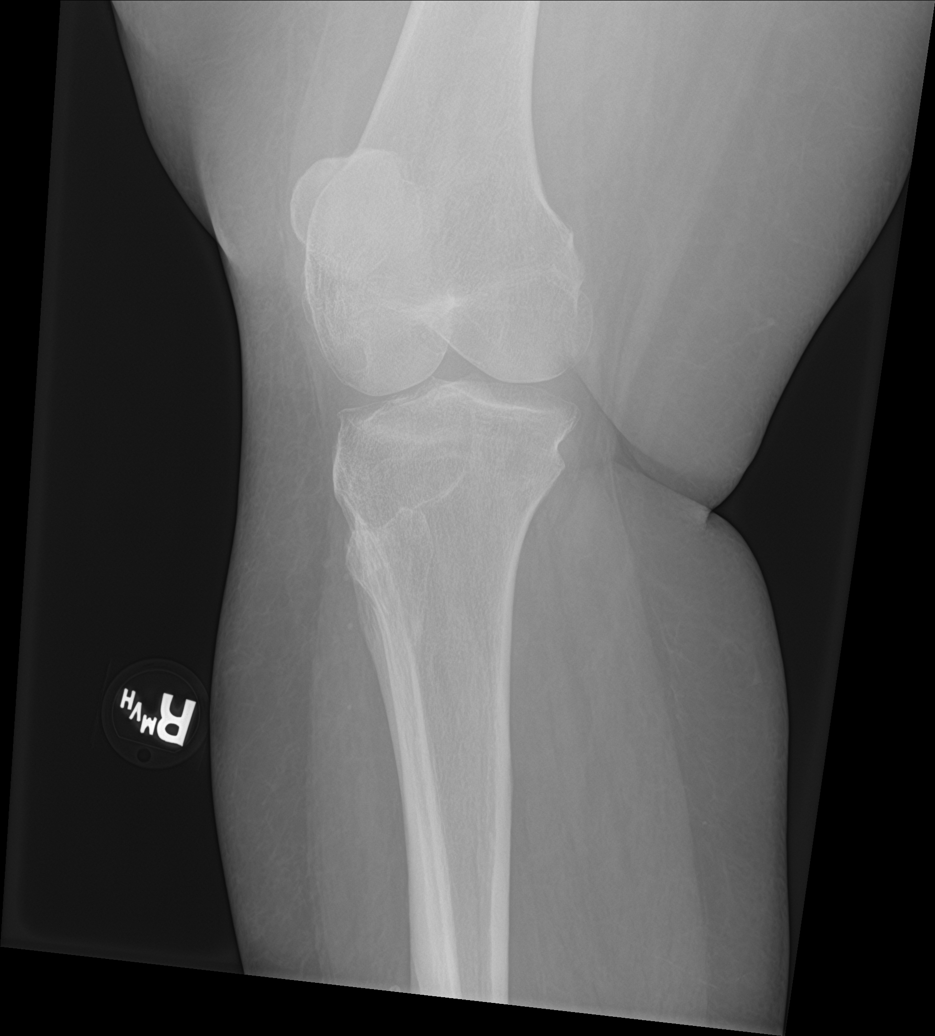

[knee lat (2 of 2)]
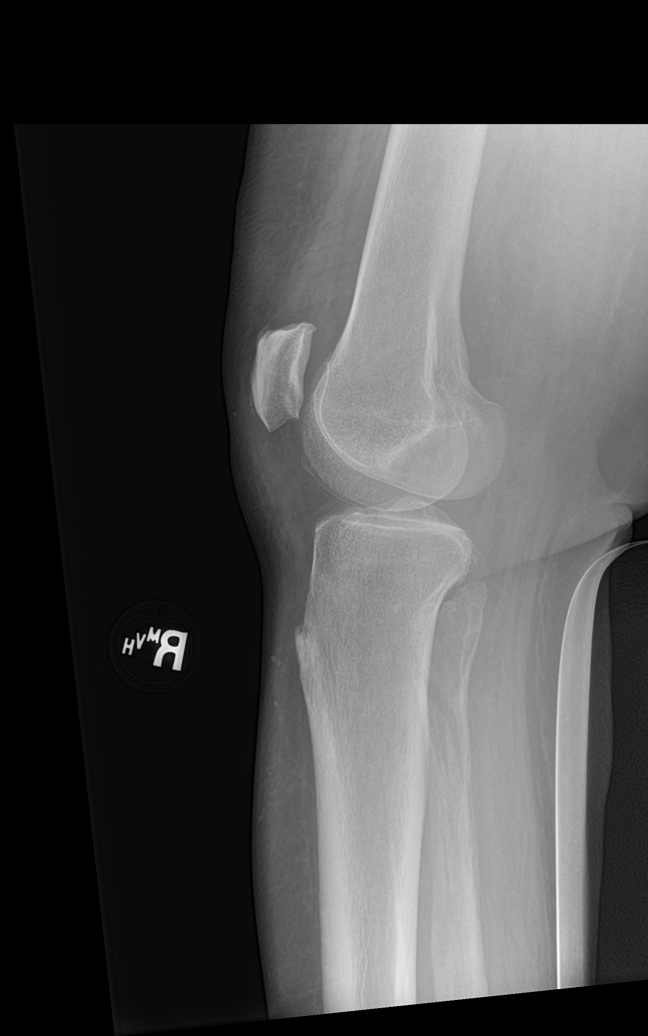

[6 of 6 positions shown; findings below may reference images not displayed]

FINDINGS: No joint effusion is noted. Mildly displaced proximal right fibular
fracture is noted. No fracture seen involving the femur or tibia.
Joint spaces are intact.
IMPRESSION: Mildly displaced proximal right fibular fracture.

## 2019-07-14 ENCOUNTER — Ambulatory Visit: Payer: BC Managed Care – PPO | Admitting: Physical Therapy

## 2019-07-23 ENCOUNTER — Other Ambulatory Visit: Payer: Self-pay

## 2019-07-23 DIAGNOSIS — Z794 Long term (current) use of insulin: Secondary | ICD-10-CM

## 2019-07-23 DIAGNOSIS — E119 Type 2 diabetes mellitus without complications: Secondary | ICD-10-CM

## 2019-07-23 NOTE — Telephone Encounter (Signed)
[name], take a look at the message. Can I actually refer to this place in our system?

## 2019-07-24 MED ORDER — LEVEMIR FLEXTOUCH 100 UNIT/ML ~~LOC~~ SOPN: PEN_INJECTOR | SUBCUTANEOUS | 0 refills | Status: DC

## 2019-07-24 NOTE — Telephone Encounter (Signed)
Faxed Rx to [facility]

## 2019-07-24 NOTE — Telephone Encounter (Signed)
Called [facility] and patient needs a written RX for a Caliber Plate DX- Foot Drop- M26.371 make sure you put your NPI # on the RX and fax it to [facility], Attention Jasmine. Phone# [phone] if you have any questions

## 2019-07-24 NOTE — Telephone Encounter (Signed)
Placed in [place].

## 2019-07-28 DIAGNOSIS — E119 Type 2 diabetes mellitus without complications: Secondary | ICD-10-CM

## 2019-07-28 MED ORDER — LEVEMIR FLEXTOUCH 100 UNIT/ML ~~LOC~~ SOPN: PEN_INJECTOR | SUBCUTANEOUS | 0 refills | Status: DC

## 2019-07-28 NOTE — Telephone Encounter (Signed)
Contacted Marive, pharmacist at [facility], who reported they did not receive the refill sent on 5/6. Gave verbal order and Charlies Constable read back for verification. Also resent electronic script.

## 2019-09-18 ENCOUNTER — Other Ambulatory Visit: Payer: Self-pay

## 2019-09-18 DIAGNOSIS — M5116 Intervertebral disc disorders with radiculopathy, lumbar region: Secondary | ICD-10-CM

## 2019-09-18 DIAGNOSIS — Z794 Long term (current) use of insulin: Secondary | ICD-10-CM

## 2019-09-18 NOTE — Telephone Encounter (Signed)
Last prescribed on 06/26/2019 and 07/28/2019 Last OV (follow up) with [name] on 02/07/2019  No future OV scheduled

## 2019-09-19 MED ORDER — LEVEMIR FLEXTOUCH 100 UNIT/ML ~~LOC~~ SOPN: PEN_INJECTOR | SUBCUTANEOUS | 0 refills | Status: DC

## 2019-09-19 MED ORDER — CYCLOBENZAPRINE HCL 10 MG PO TABS: ORAL_TABLET | ORAL | 0 refills | Status: DC

## 2019-09-19 NOTE — Telephone Encounter (Signed)
She is overdue for an office visit in regards to her diabetes.  I have reminded her numerous times. She needs to schedule appointment before I will refill her medications. Please notify me with this is done.

## 2019-09-19 NOTE — Telephone Encounter (Signed)
Refills sent to pharmacy. 

## 2019-09-19 NOTE — Telephone Encounter (Signed)
Patient have schedule appointment on Wed 09/24/2019.

## 2019-09-20 DIAGNOSIS — Z794 Long term (current) use of insulin: Secondary | ICD-10-CM

## 2019-09-20 MED ORDER — INSULIN PEN NEEDLE 32G X 4 MM MISC: 11 refills | Status: AC

## 2019-09-21 ENCOUNTER — Other Ambulatory Visit: Payer: Self-pay | Admitting: Primary Care

## 2019-09-21 DIAGNOSIS — Z794 Long term (current) use of insulin: Secondary | ICD-10-CM

## 2019-09-23 NOTE — Telephone Encounter (Signed)
Last prescribed on 05/16/2019 Last OV (follow up) with [name] on 01/30/2019  future OV scheduled on 10/01/2019

## 2019-09-24 NOTE — Telephone Encounter (Signed)
Refills sent to pharmacy. 

## 2019-09-26 ENCOUNTER — Other Ambulatory Visit: Payer: Self-pay | Admitting: Primary Care

## 2019-09-26 DIAGNOSIS — E1140 Type 2 diabetes mellitus with diabetic neuropathy, unspecified: Secondary | ICD-10-CM

## 2019-09-26 NOTE — Telephone Encounter (Signed)
Refills sent to pharmacy. 

## 2019-09-26 NOTE — Telephone Encounter (Signed)
Last prescribed on 04/14/2019  Last OV (follow up) with [name] on 01/30/2019  Future OV scheduled on 10/01/2019

## 2019-10-01 ENCOUNTER — Other Ambulatory Visit: Payer: Self-pay

## 2019-10-01 ENCOUNTER — Ambulatory Visit: Payer: BC Managed Care – PPO | Admitting: Primary Care

## 2019-10-01 ENCOUNTER — Encounter: Payer: Self-pay | Admitting: Primary Care

## 2019-10-01 VITALS — BP 126/76 | HR 96 | Temp 96.0°F

## 2019-10-01 DIAGNOSIS — R29898 Other symptoms and signs involving the musculoskeletal system: Secondary | ICD-10-CM

## 2019-10-01 DIAGNOSIS — E119 Type 2 diabetes mellitus without complications: Secondary | ICD-10-CM

## 2019-10-01 DIAGNOSIS — E7800 Pure hypercholesterolemia, unspecified: Secondary | ICD-10-CM

## 2019-10-01 DIAGNOSIS — R7989 Other specified abnormal findings of blood chemistry: Secondary | ICD-10-CM

## 2019-10-01 DIAGNOSIS — E1140 Type 2 diabetes mellitus with diabetic neuropathy, unspecified: Secondary | ICD-10-CM | POA: Diagnosis not present

## 2019-10-01 DIAGNOSIS — E782 Mixed hyperlipidemia: Secondary | ICD-10-CM

## 2019-10-01 DIAGNOSIS — I10 Essential (primary) hypertension: Secondary | ICD-10-CM | POA: Diagnosis not present

## 2019-10-01 DIAGNOSIS — Z794 Long term (current) use of insulin: Secondary | ICD-10-CM | POA: Diagnosis not present

## 2019-10-01 DIAGNOSIS — E1165 Type 2 diabetes mellitus with hyperglycemia: Secondary | ICD-10-CM

## 2019-10-01 DIAGNOSIS — E559 Vitamin D deficiency, unspecified: Secondary | ICD-10-CM | POA: Diagnosis not present

## 2019-10-01 DIAGNOSIS — F419 Anxiety disorder, unspecified: Secondary | ICD-10-CM

## 2019-10-01 DIAGNOSIS — J4520 Mild intermittent asthma, uncomplicated: Secondary | ICD-10-CM

## 2019-10-01 LAB — POCT GLYCOSYLATED HEMOGLOBIN (HGB A1C): Hemoglobin A1C: 10.6 % — AB (ref 4.0–5.6)

## 2019-10-01 LAB — COMPREHENSIVE METABOLIC PANEL
ALT: 32 U/L (ref 0–35)
AST: 31 U/L (ref 0–37)
Albumin: 3.9 g/dL (ref 3.5–5.2)
Alkaline Phosphatase: 58 U/L (ref 39–117)
BUN: 19 mg/dL (ref 6–23)
CO2: 30 mEq/L (ref 19–32)
Calcium: 9.4 mg/dL (ref 8.4–10.5)
Chloride: 101 mEq/L (ref 96–112)
Creatinine, Ser: 0.88 mg/dL (ref 0.40–1.20)
GFR: 66.64 mL/min (ref >60.00)
Glucose, Bld: 307 mg/dL — ABNORMAL HIGH (ref 70–99)
Potassium: 4.7 mEq/L (ref 3.5–5.1)
Sodium: 140 mEq/L (ref 135–145)
Total Bilirubin: 0.4 mg/dL (ref 0.2–1.2)
Total Protein: 6.4 g/dL (ref 6.0–8.3)

## 2019-10-01 LAB — T4, FREE: Free T4: 1.02 ng/dL (ref 0.60–1.60)

## 2019-10-01 LAB — LIPID PANEL
Cholesterol: 174 mg/dL (ref 0–200)
HDL: 46.1 mg/dL (ref >39.00)
NonHDL: 127.67
Total CHOL/HDL Ratio: 4
Triglycerides: 270 mg/dL — ABNORMAL HIGH (ref 0.0–149.0)
VLDL: 54 mg/dL — ABNORMAL HIGH (ref 0.0–40.0)

## 2019-10-01 LAB — CBC
HCT: 40.9 % (ref 36.0–46.0)
Hemoglobin: 13.7 g/dL (ref 12.0–15.0)
MCHC: 33.5 g/dL (ref 30.0–36.0)
MCV: 90.9 fl (ref 78.0–100.0)
Platelets: 251 10*3/uL (ref 150.0–400.0)
RBC: 4.5 Mil/uL (ref 3.87–5.11)
RDW: 14.1 % (ref 11.5–15.5)
WBC: 4.8 10*3/uL (ref 4.0–10.5)

## 2019-10-01 LAB — LDL CHOLESTEROL, DIRECT: Direct LDL: 81 mg/dL

## 2019-10-01 LAB — VITAMIN D 25 HYDROXY (VIT D DEFICIENCY, FRACTURES): VITD: 30.08 ng/mL (ref 30.00–100.00)

## 2019-10-01 LAB — TSH: TSH: 5.02 u[IU]/mL — ABNORMAL HIGH (ref 0.35–4.50)

## 2019-10-01 MED ORDER — LEVEMIR FLEXTOUCH 100 UNIT/ML ~~LOC~~ SOPN: PEN_INJECTOR | SUBCUTANEOUS | 5 refills | Status: DC

## 2019-10-01 MED ORDER — NOVOLOG FLEXPEN 100 UNIT/ML ~~LOC~~ SOPN: 22.0000 [IU] | PEN_INJECTOR | Freq: Three times a day (TID) | SUBCUTANEOUS | 0 refills | Status: DC

## 2019-10-01 MED ORDER — EMPAGLIFLOZIN 25 MG PO TABS: 25.0000 mg | ORAL_TABLET | Freq: Every day | ORAL | 3 refills | Status: DC

## 2019-10-01 NOTE — Assessment & Plan Note (Signed)
Compliant to vitamin D 1000 IU daily, repeat levels pending.

## 2019-10-01 NOTE — Assessment & Plan Note (Signed)
Overall seems to be doing well on Prozac, continue same.

## 2019-10-01 NOTE — Assessment & Plan Note (Signed)
Compliant to atorvastatin. Repeat lipid panel pending.

## 2019-10-01 NOTE — Assessment & Plan Note (Signed)
A1C today of 10.6 which is an increase from November 2020, no follow up as recommended. She has also not been checking glucose levels until just recently.  Long discussion today regarding the need to improve her diet, continue regular glucose checks, try to get in some activity.  Add in Jardiance 25 mg. Continue Trulicity. Increase Levemir to 55 units BID. Continue Novolog 22-26 units TID with meals.  She will update regarding glucose readings in 3-4 weeks. Repeat A1C in 3 months.

## 2019-10-01 NOTE — Progress Notes (Signed)
Subjective:    Patient ID: [name], female    DOB: 11/15/1965, 54 y.o.   MRN: [id number]  HPI  This visit occurred during the SARS-CoV-2 public health emergency.  Safety protocols were in place, including screening questions prior to the visit, additional usage of staff PPE, and extensive cleaning of exam room while observing appropriate contact time as indicated for disinfecting solutions.   [name] is a 54 year old female with a history of uncontrolled diabetes, GERD, asthma, chronic back pain, lower extremity weakness, chronic pain syndrome who presents today for follow up of chronic conditions.  1) Type 2 Diabetes:  Current medications include: Trulicity 1.5 mg weekly, Levemir 45 units in the AM and 52 units in the PM, Novolog 18 units TID.  She is actually injecting Levemir 46 in the AM and 52 in the PM. She is actually injecting 22-26 units of Novolog three times daily with meals.   She is checking her blood glucose 2-3 times daily and is getting readings of:   AM fasting: 215-345 Before lunch: Low 200's Before dinner: Low 200's  She just started checking her glucose levels two weeks ago.  Diet currently consists of:  Breakfast: Bacon sandwich, grits Lunch: Left overs Dinner: Meat, vegetable  Snacks: Popcorn Desserts: Ice cream nightly  Beverages: Diet soda, water  Exercise: No exercise   Last A1C: 10.1 in November 2020, 10.6 today Last Eye Exam: UTD Last Foot Exam: Due Pneumonia Vaccination: Completed in 2018 ACE/ARB: Losartan  Statin: Lipitor  2) Lower Extremity Weakness/Falls/Muscle Weakness: Following with neurology and previously with outpatient rehab. Managed on gabapentin 600 mg BID and Flexeril 10 mg for which she takes three times daily. She has a follow up visit with Neurology in September 2021. No longer with occupational therapy due to lack of improvement. She has not been officially diagnosed with ALS, but they are leaning towards this as a  cause for her weakness.   3) GAD: Managed on Fluoxetine 40 mg, overall doing well on her dose and regimen. She has good days and bad days, overall good days.   4) Hypertension: Currently managed on HCTZ 25 mg, Losartan 100 mg, metoprolol tartrate 25 mg BID. She denies chest pain, dizziness, shortness of breath, visual changes.   BP Readings from Last 3 Encounters:  10/01/19 126/76  01/30/19 126/82  10/29/18 126/80     Review of Systems  Eyes: Negative for visual disturbance.  Respiratory: Negative for shortness of breath.   Cardiovascular: Negative for chest pain.  Neurological: Positive for weakness. Negative for dizziness and headaches.       Past Medical History:  Diagnosis Date  . Allergy   . Amyotrophic lateral sclerosis (ALS) ([place]) 09/23/2018  . Anemia   . Anxiety   . Arthritis   . Asthma   . Colon polyp   . Complication of anesthesia   . Diabetes mellitus   . Edema   . Fatigue   . Hypertension   . IBS (irritable bowel syndrome)   . Lump in female breast   . Methicillin resistant Staphylococcus aureus in conditions classified elsewhere and of unspecified site   . Migraines   . Morbid obesity ([place])   . Night sweats   . Nonspecific abnormal results of thyroid function study   . Other B-complex deficiencies   . Paresthesias   . PONV (postoperative nausea and vomiting)   . Pure hypercholesterolemia   . Reflux   . Sacral fracture ([place])   .  Sinus problem   . Symptomatic states associated with artificial menopause   . Thyroid disease   . Type II or unspecified type diabetes mellitus without mention of complication, not stated as uncontrolled   . Unspecified sleep apnea      Social History   Socioeconomic History  . Marital status: Married    Spouse name: Not on file  . Number of children: 3  . Years of education: college  . Highest education level: Not on file  Occupational History  . Occupation: Data processing manager: [employer]  Tobacco Use    . Smoking status: Never Smoker  . Smokeless tobacco: Never Used  Vaping Use  . Vaping Use: Never used  Substance and Sexual Activity  . Alcohol use: Yes    Alcohol/week: 0.0 standard drinks    Comment: rarely - once per week at most-nothing for the past year 01/31/18  . Drug use: No  . Sexual activity: Never    Birth control/protection: None  Other Topics Concern  . Not on file  Social History Narrative   Exercise: walking two days per week, 30 minutes.       Married x 29 years, happily married; no abuse.       Children: 3 children; 1 grandchild; two step grandchildren; 1 gg.      Lives: with husband, mother-in-law.      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      Tobacco: never      Alcohol: socially.  One glass of wine per month.      Exercise:  Walking around campus.        Seatbelt: 100%      Guns: gun safe in garage.   Social Determinants of Health   Financial Resource Strain:   . Difficulty of Paying Living Expenses:   Food Insecurity:   . Worried About Charity fundraiser in the Last Year:   . Arboriculturist in the Last Year:   Transportation Needs:   . Film/video editor (Medical):   [name] Lack of Transportation (Non-Medical):   Physical Activity:   . Days of Exercise per Week:   . Minutes of Exercise per Session:   Stress:   . Feeling of Stress :   Social Connections:   . Frequency of Communication with Friends and Family:   . Frequency of Social Gatherings with Friends and Family:   . Attends Religious Services:   . Active Member of Clubs or Organizations:   . Attends Archivist Meetings:   [name] Marital Status:   Intimate Partner Violence:   . Fear of Current or Ex-Partner:   . Emotionally Abused:   [name] Physically Abused:   . Sexually Abused:     Past Surgical History:  Procedure Laterality Date  . abcess removal  1997   MRSA  . ABDOMINAL HYSTERECTOMY  1997   complete in 1997, partial was in 1995  . carpal tunnel release r   07/03/13   [place]  . [place]  . CHOLECYSTECTOMY  03/1989  . COLONOSCOPY  03/21/2011   Normal.  [facility].  [name] KNEE SURGERY  2006  . LAPAROSCOPIC GASTRIC BANDING  05/2009  . TONSILLECTOMY AND ADENOIDECTOMY  1974  . TUBAL LIGATION    . ULNAR TUNNEL RELEASE Right 02/05/2018   Procedure: CUBITAL TUNNEL RELEASE/DECOMPRESSION;  Surgeon: Melrose Nakayama, MD;  Location: [facility];  Service: Orthopedics;  Laterality:  Right;    Family History  Problem Relation Age of Onset  . Diabetes Mother   . Heart disease Mother 16       CHF, CAD  . Other Mother        muscle disease  . Hypertension Mother   . Hyperlipidemia Mother   . Arthritis Mother        OA hip L s/p THR  . Diabetes Father   . Heart disease Father        AMI/CABG/valve replacement age 23.  . Stroke Father   . Diabetes Brother   . Heart disease Brother        stent age 18.  . Fibromyalgia Daughter   . Crohn's disease Daughter   . Hypertension Son   . Alzheimer's disease Maternal Grandmother   . Emphysema Maternal Grandfather   . Heart disease Paternal Grandmother   . Breast cancer Other        2 Aunts    Allergies  Allergen Reactions  . Amoxicillin Shortness Of Breath and Rash  . Bee Venom Anaphylaxis  . Levofloxacin Shortness Of Breath and Rash  . Meloxicam Hives and Rash  . Vancomycin Anaphylaxis  . Sulfa Antibiotics Rash  . Ace Inhibitors Cough  . Codeine   . Meperidine Hcl   . Oxycodone-Acetaminophen   . Penicillins Swelling and Rash    Has patient had a PCN reaction causing immediate rash, facial/tongue/throat swelling, SOB or lightheadedness with hypotension: Yes Has patient had a PCN reaction causing severe rash involving mucus membranes or skin necrosis: Yes Has patient had a PCN reaction that required hospitalization: No Has patient had a PCN reaction occurring within the last 10 years: No If all of the above answers are "NO", then may proceed with Cephalosporin use.   Flo Shanks  [Gatifloxacin] Rash    Current Outpatient Medications on File Prior to Visit  Medication Sig Dispense Refill  . acetaminophen (TYLENOL) 650 MG CR tablet Take 650 mg by mouth at bedtime.    [name] albuterol (VENTOLIN HFA) 108 (90 Base) MCG/ACT inhaler INHALE 1-2 PUFFS INTO THE LUNGS EVERY 6 (SIX) HOURS AS NEEDED FOR WHEEZING OR SHORTNESS OF BREATH. 18 g 0  . atorvastatin (LIPITOR) 40 MG tablet Take 1 tablet (40 mg total) by mouth daily at 6 PM. 90 tablet 0  . azithromycin (ZITHROMAX) 250 MG tablet Take 2 tablets on Day 1. Then take 1 tablet daily. 6 tablet 0  . Blood Glucose Monitoring Suppl (ONE TOUCH ULTRA MINI) w/Device KIT Use as instructed to test blood sugar 5 times daily. 1 each 0  . Continuous Blood Gluc Sensor (FREESTYLE LIBRE 14 DAY SENSOR) MISC 1 each by Does not apply route every 14 (fourteen) days. 2 each 0  . cyclobenzaprine (FLEXERIL) 10 MG tablet TAKE 1 TABLET BY MOUTH THREE TIMES A DAY AS NEEDED FOR MUSCLE SPASMS 270 tablet 0  . FLUoxetine (PROZAC) 40 MG capsule TAKE 1 CAPSULE BY MOUTH EVERY DAY 90 capsule 1  . fluticasone (FLONASE) 50 MCG/ACT nasal spray PLACE 1 SPRAY INTO BOTH NOSTRILS 2 (TWO) TIMES DAILY. 48 mL 1  . gabapentin (NEURONTIN) 300 MG capsule TAKE 2 CAPSULES (600 MG TOTAL) BY MOUTH 2 (TWO) TIMES DAILY. 360 capsule 0  . hydrochlorothiazide (HYDRODIURIL) 25 MG tablet TAKE 1/2 TABLET (12.5 MG) BY MOUTH EVERY DAY. 45 tablet 1  . Insulin Pen Needle 32G X 4 MM MISC Use 1 needle with pen as directed 100 each 11  . ketoconazole (NIZORAL) 2 %  cream APPLY TO AFFECTED AREA EVERY DAY AS NEEDED FOR IRRITATION 60 g 0  . Lancets (ONETOUCH ULTRASOFT) lancets Use as instructed to test blood sugar 3 times daily 100 each 5  . levocetirizine (XYZAL) 5 MG tablet TAKE ONE TABLET BY MOUTH EVERY EVENING (Patient taking differently: TAKE ONE TABLET (5 mg)  BY MOUTH EVERY EVENING) 90 tablet 1  . losartan (COZAAR) 100 MG tablet TAKE 1/2 TABLET BY MOUTH DAILY 45 tablet 1  . metoprolol tartrate  (LOPRESSOR) 25 MG tablet TAKE 0.5 TABLETS (12.5 MG TOTAL) BY MOUTH 2 (TWO) TIMES DAILY. FOR BLOOD PRESSURE. 90 tablet 1  . Multiple Vitamin (MULTIVITAMIN PO) Take 1 tablet by mouth daily.     . mupirocin ointment (BACTROBAN) 2 % Place 1 application into the nose 2 (two) times daily. 15 g 0  . ondansetron (ZOFRAN-ODT) 4 MG disintegrating tablet TAKE 1 TABLET BY MOUTH EVERY 8 HOURS AS NEEDED FOR NAUSEA AS DIRECTED. 20 tablet 0  . ONETOUCH ULTRA test strip ONE TOUCH ULTRA MINI USE AS INSTRUCTED TO TEST BLOOD SUGAR 5 TIMES DAILY. 200 strip 5  . TRULICITY 1.5 LK/5.6YB SOPN INJECT 1 PEN INTO THE SKIN ONCE WEEKLY FOR DIABETES. 2 mL 2  . VALERIAN ROOT PO Take 1,000 mg by mouth at bedtime.    . Vitamin D, Ergocalciferol, (DRISDOL) 1.25 MG (50000 UT) CAPS capsule TAKE 1 CAPSULE BY MOUTH ONCE WEEKLY 12 capsule 0   No current facility-administered medications on file prior to visit.    BP 126/76   Pulse 96   Temp (!) 96 F (35.6 C) (Temporal)   SpO2 97%    Objective:   Physical Exam Cardiovascular:     Rate and Rhythm: Normal rate and regular rhythm.  Pulmonary:     Effort: Pulmonary effort is normal.     Breath sounds: Normal breath sounds.  Abdominal:     General: Abdomen is flat. Bowel sounds are normal.     Palpations: Abdomen is soft.     Tenderness: There is no abdominal tenderness.  Neurological:     Mental Status: She is alert and oriented to person, place, and time.  Psychiatric:        Mood and Affect: Mood normal.            Assessment & Plan:

## 2019-10-01 NOTE — Assessment & Plan Note (Signed)
Repeat TSH and Free T4 pending.

## 2019-10-01 NOTE — Assessment & Plan Note (Signed)
Discussed the importance of a healthy diet and regular activity in order for weight loss, and to reduce the risk of any potential medical problems.

## 2019-10-01 NOTE — Assessment & Plan Note (Signed)
Overall stable, infrequent use of albuterol. No wheezing on exam. Continue to monitor.

## 2019-10-01 NOTE — Assessment & Plan Note (Signed)
Chronic, following with neurology, next visit in September 2021. No longer with outpatient rehab due to lack of improvement.  Continue Flexeril for which she requires TID, also gabapentin 600 mg BID.

## 2019-10-01 NOTE — Patient Instructions (Addendum)
Start Jardiance 25 mg daily for diabetes.  We've increased your Levemir to 55 units twice daily for diabetes.  Continue Novolog 22-26 units three times daily with meals for diabetes.  Continue Trulicity weekly.  Stop by the lab prior to leaving today. I will notify you of your results once received.   Continue checking your blood sugars, please update me in 3-4 weeks as discussed.  Please schedule a follow up appointment in 6 months.   It was a pleasure to see you today!   Diabetes Mellitus and Nutrition, Adult When you have diabetes (diabetes mellitus), it is very important to have healthy eating habits because your blood sugar (glucose) levels are greatly affected by what you eat and drink. Eating healthy foods in the appropriate amounts, at about the same times every day, can help you:  Control your blood glucose.  Lower your risk of heart disease.  Improve your blood pressure.  Reach or maintain a healthy weight. Every person with diabetes is different, and each person has different needs for a meal plan. Your health care provider may recommend that you work with a diet and nutrition specialist (dietitian) to make a meal plan that is best for you. Your meal plan may vary depending on factors such as:  The calories you need.  The medicines you take.  Your weight.  Your blood glucose, blood pressure, and cholesterol levels.  Your activity level.  Other health conditions you have, such as heart or kidney disease. How do carbohydrates affect me? Carbohydrates, also called carbs, affect your blood glucose level more than any other type of food. Eating carbs naturally raises the amount of glucose in your blood. Carb counting is a method for keeping track of how many carbs you eat. Counting carbs is important to keep your blood glucose at a healthy level, especially if you use insulin or take certain oral diabetes medicines. It is important to know how many carbs you can safely  have in each meal. This is different for every person. Your dietitian can help you calculate how many carbs you should have at each meal and for each snack. Foods that contain carbs include:  Bread, cereal, rice, pasta, and crackers.  Potatoes and corn.  Peas, beans, and lentils.  Milk and yogurt.  Fruit and juice.  Desserts, such as cakes, cookies, ice cream, and candy. How does alcohol affect me? Alcohol can cause a sudden decrease in blood glucose (hypoglycemia), especially if you use insulin or take certain oral diabetes medicines. Hypoglycemia can be a life-threatening condition. Symptoms of hypoglycemia (sleepiness, dizziness, and confusion) are similar to symptoms of having too much alcohol. If your health care provider says that alcohol is safe for you, follow these guidelines:  Limit alcohol intake to no more than 1 drink per day for nonpregnant women and 2 drinks per day for men. One drink equals 12 oz of beer, 5 oz of wine, or 1 oz of hard liquor.  Do not drink on an empty stomach.  Keep yourself hydrated with water, diet soda, or unsweetened iced tea.  Keep in mind that regular soda, juice, and other mixers may contain a lot of sugar and must be counted as carbs. What are tips for following this plan?  Reading food labels  Start by checking the serving size on the "Nutrition Facts" label of packaged foods and drinks. The amount of calories, carbs, fats, and other nutrients listed on the label is based on one serving of the item.  Many items contain more than one serving per package.  Check the total grams (g) of carbs in one serving. You can calculate the number of servings of carbs in one serving by dividing the total carbs by 15. For example, if a food has 30 g of total carbs, it would be equal to 2 servings of carbs.  Check the number of grams (g) of saturated and trans fats in one serving. Choose foods that have low or no amount of these fats.  Check the number of  milligrams (mg) of salt (sodium) in one serving. Most people should limit total sodium intake to less than 2,300 mg per day.  Always check the nutrition information of foods labeled as "low-fat" or "nonfat". These foods may be higher in added sugar or refined carbs and should be avoided.  Talk to your dietitian to identify your daily goals for nutrients listed on the label. Shopping  Avoid buying canned, premade, or processed foods. These foods tend to be high in fat, sodium, and added sugar.  Shop around the outside edge of the grocery store. This includes fresh fruits and vegetables, bulk grains, fresh meats, and fresh dairy. Cooking  Use low-heat cooking methods, such as baking, instead of high-heat cooking methods like deep frying.  Cook using healthy oils, such as olive, canola, or sunflower oil.  Avoid cooking with butter, cream, or high-fat meats. Meal planning  Eat meals and snacks regularly, preferably at the same times every day. Avoid going long periods of time without eating.  Eat foods high in fiber, such as fresh fruits, vegetables, beans, and whole grains. Talk to your dietitian about how many servings of carbs you can eat at each meal.  Eat 4-6 ounces (oz) of lean protein each day, such as lean meat, chicken, fish, eggs, or tofu. One oz of lean protein is equal to: ? 1 oz of meat, chicken, or fish. ? 1 egg. ?  cup of tofu.  Eat some foods each day that contain healthy fats, such as avocado, nuts, seeds, and fish. Lifestyle  Check your blood glucose regularly.  Exercise regularly as told by your health care provider. This may include: ? 150 minutes of moderate-intensity or vigorous-intensity exercise each week. This could be brisk walking, biking, or water aerobics. ? Stretching and doing strength exercises, such as yoga or weightlifting, at least 2 times a week.  Take medicines as told by your health care provider.  Do not use any products that contain nicotine  or tobacco, such as cigarettes and e-cigarettes. If you need help quitting, ask your health care provider.  Work with a Social worker or diabetes educator to identify strategies to manage stress and any emotional and social challenges. Questions to ask a health care provider  Do I need to meet with a diabetes educator?  Do I need to meet with a dietitian?  What number can I call if I have questions?  When are the best times to check my blood glucose? Where to find more information:  American Diabetes Association: diabetes.org  Academy of Nutrition and Dietetics: www.eatright.[employer] of Diabetes and Digestive and Kidney Diseases (NIH): [employer] Summary  A healthy meal plan will help you control your blood glucose and maintain a healthy lifestyle.  Working with a diet and nutrition specialist (dietitian) can help you make a meal plan that is best for you.  Keep in mind that carbohydrates (carbs) and alcohol have immediate effects on your blood glucose levels. It is  important to count carbs and to use alcohol carefully. This information is not intended to replace advice given to you by your health care provider. Make sure you discuss any questions you have with your health care provider. Document Revised: 02/16/2017 Document Reviewed: 04/10/2016 Elsevier Patient Education  2020 [employer].

## 2019-10-01 NOTE — Assessment & Plan Note (Signed)
Well controlled in the office today, continue current regimen. CMP pending. 

## 2019-10-17 ENCOUNTER — Other Ambulatory Visit: Payer: Self-pay | Admitting: Primary Care

## 2019-10-17 DIAGNOSIS — F419 Anxiety disorder, unspecified: Secondary | ICD-10-CM

## 2019-11-19 ENCOUNTER — Other Ambulatory Visit: Payer: Self-pay | Admitting: Primary Care

## 2019-11-19 DIAGNOSIS — I10 Essential (primary) hypertension: Secondary | ICD-10-CM

## 2019-11-19 NOTE — Telephone Encounter (Signed)
Refills sent to pharmacy. 

## 2019-11-19 NOTE — Telephone Encounter (Signed)
Does she want for me to send in the 50 mg tablets of losartan since she is taking 1/2 tablet of the 100 mg dose?  This point she does not have to cut the pills in half.

## 2019-11-19 NOTE — Telephone Encounter (Signed)
Patient says it is cheaper for her to cut the medication in half.

## 2019-11-19 NOTE — Telephone Encounter (Signed)
Cozaar refill request.  Last seen 10/01/2019, last filled 05/21/2019

## 2019-11-29 ENCOUNTER — Other Ambulatory Visit: Payer: Self-pay

## 2019-11-29 DIAGNOSIS — E1140 Type 2 diabetes mellitus with diabetic neuropathy, unspecified: Secondary | ICD-10-CM

## 2019-12-01 ENCOUNTER — Telehealth: Payer: BC Managed Care – PPO | Admitting: Nurse Practitioner

## 2019-12-01 DIAGNOSIS — J0100 Acute maxillary sinusitis, unspecified: Secondary | ICD-10-CM | POA: Diagnosis not present

## 2019-12-01 MED ORDER — NOVOLOG FLEXPEN 100 UNIT/ML ~~LOC~~ SOPN
22.0000 [IU] | PEN_INJECTOR | Freq: Three times a day (TID) | SUBCUTANEOUS | 5 refills | Status: DC
Start: 2019-12-01 — End: 2023-06-12

## 2019-12-01 MED ORDER — DOXYCYCLINE HYCLATE 100 MG PO TABS: 100.0000 mg | ORAL_TABLET | Freq: Two times a day (BID) | ORAL | 0 refills | Status: DC

## 2019-12-01 NOTE — Progress Notes (Signed)
We are sorry that you are not feeling well.  Here is how we plan to help!  Based on what you have shared with me it looks like you have sinusitis.  Sinusitis is inflammation and infection in the sinus cavities of the head.  Based on your presentation I believe you most likely have Acute Bacterial Sinusitis.  This is an infection caused by bacteria and is treated with antibiotics. I have prescribed Doxycycline 100mg by mouth twice a day for 10 days. You may use an oral decongestant such as Mucinex D or if you have glaucoma or high blood pressure use plain Mucinex. Saline nasal spray help and can safely be used as often as needed for congestion.  If you develop worsening sinus pain, fever or notice severe headache and vision changes, or if symptoms are not better after completion of antibiotic, please schedule an appointment with a health care provider.    Sinus infections are not as easily transmitted as other respiratory infection, however we still recommend that you avoid close contact with loved ones, especially the very young and elderly.  Remember to wash your hands thoroughly throughout the day as this is the number one way to prevent the spread of infection!  Home Care:  Only take medications as instructed by your medical team.  Complete the entire course of an antibiotic.  Do not take these medications with alcohol.  A steam or ultrasonic humidifier can help congestion.  You can place a towel over your head and breathe in the steam from hot water coming from a faucet.  Avoid close contacts especially the very young and the elderly.  Cover your mouth when you cough or sneeze.  Always remember to wash your hands.  Get Help Right Away If:  You develop worsening fever or sinus pain.  You develop a severe head ache or visual changes.  Your symptoms persist after you have completed your treatment plan.  Make sure you  Understand these instructions.  Will watch your  condition.  Will get help right away if you are not doing well or get worse.  Your e-visit answers were reviewed by a board certified advanced clinical practitioner to complete your personal care plan.  Depending on the condition, your plan could have included both over the counter or prescription medications.  If there is a problem please reply  once you have received a response from your provider.  Your safety is important to us.  If you have drug allergies check your prescription carefully.    You can use MyChart to ask questions about today's visit, request a non-urgent call back, or ask for a work or school excuse for 24 hours related to this e-Visit. If it has been greater than 24 hours you will need to follow up with your provider, or enter a new e-Visit to address those concerns.  You will get an e-mail in the next two days asking about your experience.  I hope that your e-visit has been valuable and will speed your recovery. Thank you for using e-visits.  5-10 minutes spent reviewing and documenting in chart.  

## 2019-12-11 ENCOUNTER — Other Ambulatory Visit: Payer: Self-pay | Admitting: Primary Care

## 2019-12-11 DIAGNOSIS — M5116 Intervertebral disc disorders with radiculopathy, lumbar region: Secondary | ICD-10-CM

## 2019-12-11 DIAGNOSIS — I10 Essential (primary) hypertension: Secondary | ICD-10-CM

## 2019-12-11 DIAGNOSIS — E1140 Type 2 diabetes mellitus with diabetic neuropathy, unspecified: Secondary | ICD-10-CM

## 2019-12-31 ENCOUNTER — Other Ambulatory Visit: Payer: Self-pay

## 2019-12-31 ENCOUNTER — Ambulatory Visit (INDEPENDENT_AMBULATORY_CARE_PROVIDER_SITE_OTHER): Payer: BC Managed Care – PPO

## 2019-12-31 DIAGNOSIS — Z23 Encounter for immunization: Secondary | ICD-10-CM | POA: Diagnosis not present

## 2020-01-06 ENCOUNTER — Other Ambulatory Visit: Payer: Self-pay | Admitting: Primary Care

## 2020-01-06 DIAGNOSIS — E1165 Type 2 diabetes mellitus with hyperglycemia: Secondary | ICD-10-CM

## 2020-01-06 DIAGNOSIS — Z794 Long term (current) use of insulin: Secondary | ICD-10-CM

## 2020-02-06 ENCOUNTER — Telehealth: Payer: Self-pay

## 2020-02-06 DIAGNOSIS — E1140 Type 2 diabetes mellitus with diabetic neuropathy, unspecified: Secondary | ICD-10-CM

## 2020-02-06 MED ORDER — LEVEMIR FLEXTOUCH 100 UNIT/ML ~~LOC~~ SOPN: PEN_INJECTOR | SUBCUTANEOUS | 0 refills | Status: DC

## 2020-02-06 NOTE — Telephone Encounter (Signed)
[facility] called to report that Levemir is on back order and is requesting alternative to be sent to the pharmacy [facility], Lantus Toujeo  After opening chart I see that pt is wanting to moved Rx to [facility] instead  [place], [facility]" to Pleas Koch, NP     02/06/20 9:55 AM Please disregard [facility] call.  Have been out and they have not been able to get for over 3 weeks but I have a problem.  I am moving to [employer].

## 2020-02-19 ENCOUNTER — Other Ambulatory Visit: Payer: Self-pay | Admitting: Family Medicine

## 2020-02-19 DIAGNOSIS — E7800 Pure hypercholesterolemia, unspecified: Secondary | ICD-10-CM

## 2020-02-20 MED ORDER — ATORVASTATIN CALCIUM 40 MG PO TABS: 40.0000 mg | ORAL_TABLET | Freq: Every day | ORAL | 1 refills | Status: DC

## 2020-02-20 NOTE — Telephone Encounter (Signed)
Pharmacy requests refill on: Atorvastatin 40 mg   LAST REFILL: 02/17/2019 (Q-90, R-0) LAST OV: 10/01/2019 NEXT OV: Not Scheduled  PHARMACY: [place]

## 2020-03-19 ENCOUNTER — Other Ambulatory Visit: Payer: Self-pay

## 2020-03-19 ENCOUNTER — Ambulatory Visit
Admission: EM | Admit: 2020-03-19 | Discharge: 2020-03-19 | Disposition: A | Discharge status: Home / Self Care | Payer: Medicare PPO | Attending: Emergency Medicine | Admitting: Emergency Medicine

## 2020-03-19 DIAGNOSIS — J069 Acute upper respiratory infection, unspecified: Secondary | ICD-10-CM | POA: Diagnosis not present

## 2020-03-19 DIAGNOSIS — J4520 Mild intermittent asthma, uncomplicated: Secondary | ICD-10-CM

## 2020-03-19 MED ORDER — ALBUTEROL SULFATE HFA 108 (90 BASE) MCG/ACT IN AERS: 1.0000 | INHALATION_SPRAY | Freq: Four times a day (QID) | RESPIRATORY_TRACT | 0 refills | Status: DC | PRN

## 2020-03-19 MED ORDER — CYCLOBENZAPRINE HCL 5 MG PO TABS: 5.0000 mg | ORAL_TABLET | Freq: Two times a day (BID) | ORAL | 0 refills | Status: AC | PRN

## 2020-03-19 MED ORDER — AEROCHAMBER PLUS FLO-VU MEDIUM MISC: 1.0000 | Freq: Once | 0 refills | Status: AC

## 2020-03-19 MED ORDER — GUAIFENESIN-DM 100-10 MG/5ML PO SYRP: 5.0000 mL | ORAL_SOLUTION | ORAL | 0 refills | Status: DC | PRN

## 2020-03-19 MED ORDER — ALBUTEROL SULFATE (2.5 MG/3ML) 0.083% IN NEBU: 2.5000 mg | INHALATION_SOLUTION | Freq: Four times a day (QID) | RESPIRATORY_TRACT | 12 refills | Status: DC | PRN

## 2020-03-19 NOTE — ED Triage Notes (Signed)
Pt c/o cough and congestion x4 days. States having SOB at times.

## 2020-03-19 NOTE — ED Provider Notes (Signed)
[facility]    CSN: [id number] Arrival date & time: 03/19/20  0915      History   Chief Complaint Chief Complaint  Patient presents with  . Cough    HPI [name] is a 54 y.o. female  With extensive medical history as below presenting for 4-day course of URI symptoms.  Patient endorsing cough, congestion.  Does have intermittent dyspnea: Used to have a nebulizer, though does not have this currently.  Requesting muscle relaxer for intercostal soreness from coughing.  Has tolerated this well in the past.  No known sick contacts.  Past Medical History:  Diagnosis Date  . Allergy   . Amyotrophic lateral sclerosis (ALS) ([place]) 09/23/2018  . Anemia   . Anxiety   . Arthritis   . Asthma   . Colon polyp   . Complication of anesthesia   . Diabetes mellitus   . Displaced fracture of proximal end of right fibula 07/21/2017  . Edema   . Fatigue   . Hypertension   . IBS (irritable bowel syndrome)   . Lump in female breast   . Methicillin resistant Staphylococcus aureus in conditions classified elsewhere and of unspecified site   . Migraines   . Morbid obesity ([place])   . Night sweats   . Nonspecific abnormal results of thyroid function study   . Other B-complex deficiencies   . Paresthesias   . PONV (postoperative nausea and vomiting)   . Pure hypercholesterolemia   . Reflux   . Sacral fracture ([place])   . Sinus problem   . Symptomatic states associated with artificial menopause   . Thyroid disease   . Type II or unspecified type diabetes mellitus without mention of complication, not stated as uncontrolled   . Unspecified sleep apnea     Patient Active Problem List   Diagnosis Date Noted  . Nasal mass 01/30/2019  . Vitamin D deficiency 10/29/2018  . Migraine without status migrainosus, not intractable   . Chronic pain syndrome   . Lumbar disc disease with radiculopathy 07/21/2017  . Lumbar foraminal stenosis 07/21/2017  . Unable to walk 07/21/2017  .  Multiple falls 07/21/2017  . Lower extremity weakness 05/28/2017  . Abnormal TSH 01/10/2016  . Morbid obesity ([place]) 01/12/2014  . Routine general medical examination at a health care facility 10/28/2012  . GERD (gastroesophageal reflux disease) 10/28/2012  . Anxiety 12/20/2011  . Allergic rhinitis 12/20/2011  . Asthma 12/20/2011  . Lapband APL with HH repair 03/31/2011  . Uncontrolled type 2 diabetes mellitus with hyperglycemia, with long-term current use of insulin ([place]) 06/30/2007  . Hyperlipidemia 06/30/2007  . Essential hypertension 06/27/2007    Past Surgical History:  Procedure Laterality Date  . abcess removal  1997   MRSA  . ABDOMINAL HYSTERECTOMY  1997   complete in 1997, partial was in 1995  . carpal tunnel release r  07/03/13   [place]  . [place]  . CHOLECYSTECTOMY  03/1989  . COLONOSCOPY  03/21/2011   Normal.  [place].  [name] KNEE SURGERY  2006  . LAPAROSCOPIC GASTRIC BANDING  05/2009  . TONSILLECTOMY AND ADENOIDECTOMY  1974  . TUBAL LIGATION    . ULNAR TUNNEL RELEASE Right 02/05/2018   Procedure: CUBITAL TUNNEL RELEASE/DECOMPRESSION;  Surgeon: Melrose Nakayama, MD;  Location: [facility];  Service: Orthopedics;  Laterality: Right;    OB History   No obstetric history on file.      Home Medications    Prior  to Admission medications   Medication Sig Start Date End Date Taking? Authorizing Provider  albuterol (PROVENTIL) (2.5 MG/3ML) 0.083% nebulizer solution Take 3 mLs (2.5 mg total) by nebulization every 6 (six) hours as needed for wheezing or shortness of breath. 03/19/20  Yes Hall-Potvin, [place], PA-C  cyclobenzaprine (FLEXERIL) 5 MG tablet Take 1 tablet (5 mg total) by mouth 2 (two) times daily as needed for up to 7 days for muscle spasms. 03/19/20 03/26/20 Yes Hall-Potvin, [place], PA-C  guaiFENesin-dextromethorphan (ROBITUSSIN DM) 100-10 MG/5ML syrup Take 5 mLs by mouth every 4 (four) hours as needed for cough. 03/19/20  Yes Hall-Potvin,  [place], PA-C  Spacer/Aero-Holding Chambers (AEROCHAMBER PLUS FLO-VU MEDIUM) MISC 1 each by Other route once for 1 dose. 03/19/20 03/19/20 Yes Hall-Potvin, [place], PA-C  acetaminophen (TYLENOL) 650 MG CR tablet Take 650 mg by mouth at bedtime.    [provider]  albuterol (VENTOLIN HFA) 108 (90 Base) MCG/ACT inhaler Inhale 1-2 puffs into the lungs every 6 (six) hours as needed for wheezing or shortness of breath. 03/19/20   Hall-Potvin, [place], PA-C  atorvastatin (LIPITOR) 40 MG tablet Take 1 tablet (40 mg total) by mouth daily at 6 PM. 02/20/20   Pleas Koch, NP  Blood Glucose Monitoring Suppl (ONE TOUCH ULTRA MINI) w/Device KIT Use as instructed to test blood sugar 5 times daily. 06/12/17   Pleas Koch, NP  Continuous Blood Gluc Sensor (FREESTYLE LIBRE 14 DAY SENSOR) MISC 1 each by Does not apply route every 14 (fourteen) days. 05/28/17   Pleas Koch, NP  doxycycline (VIBRA-TABS) 100 MG tablet Take 1 tablet (100 mg total) by mouth 2 (two) times daily. 1 po bid 12/01/19   Hassell Done, Mary-Margaret, FNP  empagliflozin (JARDIANCE) 25 MG TABS tablet Take 1 tablet (25 mg total) by mouth daily before breakfast. For diabetes. 10/01/19   Pleas Koch, NP  FLUoxetine (PROZAC) 40 MG capsule TAKE 1 CAPSULE BY MOUTH EVERY DAY 10/17/19   Pleas Koch, NP  fluticasone (FLONASE) 50 MCG/ACT nasal spray PLACE 1 SPRAY INTO BOTH NOSTRILS 2 (TWO) TIMES DAILY. 05/27/19   Pleas Koch, NP  gabapentin (NEURONTIN) 300 MG capsule TAKE 2 CAPSULES (600 MG TOTAL) BY MOUTH 2 (TWO) TIMES DAILY. 12/12/19   Pleas Koch, NP  hydrochlorothiazide (HYDRODIURIL) 25 MG tablet TAKE 1/2 TABLET (12.5 MG) BY MOUTH EVERY DAY. 12/12/19   Pleas Koch, NP  insulin aspart (NOVOLOG FLEXPEN) 100 UNIT/ML FlexPen Inject 22-26 Units into the skin 3 (three) times daily with meals. 12/01/19   Pleas Koch, NP  insulin detemir (LEVEMIR FLEXTOUCH) 100 UNIT/ML FlexPen INJECT 55 UNITS IN THE MORNING  AND 55 UNITS IN THE EVENING FOR DIABETES 02/06/20   Pleas Koch, NP  Insulin Pen Needle 32G X 4 MM MISC Use 1 needle with pen as directed 09/20/19   Pleas Koch, NP  ketoconazole (NIZORAL) 2 % cream APPLY TO AFFECTED AREA EVERY DAY AS NEEDED FOR IRRITATION 03/17/19   Pleas Koch, NP  Lancets [facility] ULTRASOFT) lancets Use as instructed to test blood sugar 3 times daily 06/11/17   Pleas Koch, NP  levocetirizine (XYZAL) 5 MG tablet TAKE ONE TABLET BY MOUTH EVERY EVENING Patient taking differently: TAKE ONE TABLET (5 mg)  BY MOUTH EVERY EVENING 07/16/17   Pleas Koch, NP  losartan (COZAAR) 100 MG tablet TAKE 1/2 TABLET BY MOUTH EVERY DAY 11/19/19   Pleas Koch, NP  metoprolol tartrate (LOPRESSOR) 25 MG tablet TAKE 0.5 TABLETS (  12.5 MG TOTAL) BY MOUTH 2 (TWO) TIMES DAILY. FOR BLOOD PRESSURE. 04/14/19   Pleas Koch, NP  Multiple Vitamin (MULTIVITAMIN PO) Take 1 tablet by mouth daily.     [provider]  mupirocin ointment (BACTROBAN) 2 % Place 1 application into the nose 2 (two) times daily. 01/30/19   Pleas Koch, NP  ondansetron (ZOFRAN-ODT) 4 MG disintegrating tablet TAKE 1 TABLET BY MOUTH EVERY 8 HOURS AS NEEDED FOR NAUSEA AS DIRECTED. 03/11/19   Pleas Koch, NP  ONETOUCH ULTRA test strip ONE TOUCH ULTRA MINI USE AS INSTRUCTED TO TEST BLOOD SUGAR 5 TIMES DAILY. 12/13/18   Pleas Koch, NP  TRULICITY 1.5 OI/7.1IW SOPN INJECT 1 PEN INTO THE SKIN ONCE WEEKLY FOR DIABETES 01/07/20   Pleas Koch, NP  VALERIAN ROOT PO Take 1,000 mg by mouth at bedtime.    [provider]    Family History Family History  Problem Relation Age of Onset  . Diabetes Mother   . Heart disease Mother 53       CHF, CAD  . Other Mother        muscle disease  . Hypertension Mother   . Hyperlipidemia Mother   . Arthritis Mother        OA hip L s/p THR  . Diabetes Father   . Heart disease Father        AMI/CABG/valve replacement age  85.  . Stroke Father   . Diabetes Brother   . Heart disease Brother        stent age 33.  . Fibromyalgia Daughter   . Crohn's disease Daughter   . Hypertension Son   . Alzheimer's disease Maternal Grandmother   . Emphysema Maternal Grandfather   . Heart disease Paternal Grandmother   . Breast cancer Other        2 Aunts    Social History Social History   Tobacco Use  . Smoking status: Never Smoker  . Smokeless tobacco: Never Used  Vaping Use  . Vaping Use: Never used  Substance Use Topics  . Alcohol use: Yes    Alcohol/week: 0.0 standard drinks    Comment: rarely - once per week at most-nothing for the past year 01/31/18  . Drug use: No     Allergies   Amoxicillin, Bee venom, Levofloxacin, Meloxicam, Vancomycin, Sulfa antibiotics, Ace inhibitors, Codeine, Meperidine hcl, Oxycodone-acetaminophen, Penicillins, and Tequin [gatifloxacin]   Review of Systems Review of Systems  Constitutional: Negative for fatigue and fever.  HENT: Positive for congestion. Negative for dental problem, ear pain, facial swelling, hearing loss, sinus pain, sore throat, trouble swallowing and voice change.   Eyes: Negative for photophobia, pain and visual disturbance.  Respiratory: Positive for cough. Negative for shortness of breath and wheezing.   Cardiovascular: Negative for chest pain and palpitations.  Gastrointestinal: Negative for diarrhea and vomiting.  Musculoskeletal: Negative for arthralgias and myalgias.  Neurological: Negative for dizziness and headaches.     Physical Exam Triage Vital Signs ED Triage Vitals  Enc Vitals Group     BP 03/19/20 1111 (!) 167/70     Pulse Rate 03/19/20 1111 97     Resp 03/19/20 1111 18     Temp 03/19/20 1111 98.2 F (36.8 C)     Temp Source 03/19/20 1111 Oral     SpO2 03/19/20 1111 99 %     Weight --      Height --      Head Circumference --  Peak Flow --      Pain Score 03/19/20 1112 0     Pain Loc --      Pain Edu? --      Excl.  in [place]? --    No data found.  Updated Vital Signs BP (!) 167/70 (BP Location: Left Arm)   Pulse 97   Temp 98.2 F (36.8 C) (Oral)   Resp 18   SpO2 99%   Visual Acuity Right Eye Distance:   Left Eye Distance:   Bilateral Distance:    Right Eye Near:   Left Eye Near:    Bilateral Near:     Physical Exam Constitutional:      General: She is not in acute distress.    Appearance: She is not ill-appearing or diaphoretic.  HENT:     Head: Normocephalic and atraumatic.     Right Ear: Tympanic membrane and ear canal normal.     Left Ear: Tympanic membrane and ear canal normal.     Mouth/Throat:     Mouth: Mucous membranes are moist.     Pharynx: Oropharynx is clear. No oropharyngeal exudate or posterior oropharyngeal erythema.  Eyes:     General: No scleral icterus.    Conjunctiva/sclera: Conjunctivae normal.     Pupils: Pupils are equal, round, and reactive to light.  Neck:     Comments: Trachea midline, negative JVD Cardiovascular:     Rate and Rhythm: Normal rate and regular rhythm.     Heart sounds: No murmur heard. No gallop.   Pulmonary:     Effort: Pulmonary effort is normal. No respiratory distress.     Breath sounds: No wheezing, rhonchi or rales.  Musculoskeletal:     Cervical back: Neck supple. No tenderness.  Lymphadenopathy:     Cervical: No cervical adenopathy.  Skin:    Capillary Refill: Capillary refill takes less than 2 seconds.     Coloration: Skin is not jaundiced or pale.     Findings: No rash.  Neurological:     General: No focal deficit present.     Mental Status: She is alert and oriented to person, place, and time.      UC Treatments / Results  Labs (all labs ordered are listed, but only abnormal results are displayed) Labs Reviewed  NOVEL CORONAVIRUS, NAA    EKG   Radiology No results found.  Procedures Procedures (including critical care time)  Medications Ordered in UC Medications - No data to display  Initial Impression  / Assessment and Plan / UC Course  I have reviewed the triage vital signs and the nursing notes.  Pertinent labs & imaging results that were available during my care of the patient were reviewed by me and considered in my medical decision making (see chart for details).     Patient afebrile, nontoxic, with SpO2 99%.  Covid PCR pending.  Patient to quarantine until results are back.  We will treat supportively as outlined below.  Return precautions discussed, patient verbalized understanding and is agreeable to plan. Final Clinical Impressions(s) / UC Diagnoses   Final diagnoses:  URI with cough and congestion   Discharge Instructions   None    ED Prescriptions    Medication Sig Dispense Auth. Provider   cyclobenzaprine (FLEXERIL) 5 MG tablet Take 1 tablet (5 mg total) by mouth 2 (two) times daily as needed for up to 7 days for muscle spasms. 14 tablet Hall-Potvin, [place], PA-C   guaiFENesin-dextromethorphan (ROBITUSSIN DM) 100-10 MG/5ML syrup Take  5 mLs by mouth every 4 (four) hours as needed for cough. 118 mL Hall-Potvin, [place], PA-C   albuterol (PROVENTIL) (2.5 MG/3ML) 0.083% nebulizer solution Take 3 mLs (2.5 mg total) by nebulization every 6 (six) hours as needed for wheezing or shortness of breath. 75 mL Hall-Potvin, [place], PA-C   albuterol (VENTOLIN HFA) 108 (90 Base) MCG/ACT inhaler Inhale 1-2 puffs into the lungs every 6 (six) hours as needed for wheezing or shortness of breath. 18 g Hall-Potvin, [place], PA-C   Spacer/Aero-Holding Chambers (AEROCHAMBER PLUS FLO-VU MEDIUM) MISC 1 each by Other route once for 1 dose. 1 each Hall-Potvin, [place], PA-C     PDMP not reviewed this encounter.   Hall-Potvin, [place], [place] 03/19/20 1121

## 2020-03-22 ENCOUNTER — Telehealth: Payer: Self-pay

## 2020-03-22 DIAGNOSIS — R059 Cough, unspecified: Secondary | ICD-10-CM

## 2020-03-22 MED ORDER — PROMETHAZINE-DM 6.25-15 MG/5ML PO SYRP: 5.0000 mL | ORAL_SOLUTION | Freq: Four times a day (QID) | ORAL | 0 refills | Status: DC | PRN

## 2020-03-22 NOTE — Telephone Encounter (Signed)
Sending in promethazine - DM  Can take at night, but do not take at the same time as other cough syrup as they DM is the same.

## 2020-03-22 NOTE — Telephone Encounter (Signed)
Called and updated patient of this. Patient verbalized understanding.

## 2020-03-22 NOTE — Telephone Encounter (Signed)
[facility] Night - Client TELEPHONE ADVICE RECORD AccessNurse Patient Name: [name] Gender: Female DOB: 11/10/1965 Age: 55 Y 8 M 29 D Return Phone Number: [phone] (Primary), [phone] (Secondary) Address[name] [address] Client [facility] Night - Client Client Site [facility] - Night Physician Vernona Rieger - NP Contact Type Call Who Is Calling Patient / Member / Family / Caregiver Call Type Triage / Clinical Caller Name [name] Relationship To Patient Self Return Phone Number [phone] (Primary) Chief Complaint Cough Reason for Call Symptomatic / Request for Health Information Initial Comment Caller states she has a cough , earache. She is prone to pneumonia and the cough is making her urinate on herself. Translation No Nurse Assessment Nurse: Williams Che, RN, April Date/Time Lamount Cohen Time): 03/19/2020 8:32:27 AM Confirm and document reason for call. If symptomatic, describe symptoms. ---Caller states her cough and ear ache that both began 4 days ago. Not sure about a fever. Does the patient have any new or worsening symptoms? ---Yes Will a triage be completed? ---Yes Related visit to physician within the last 2 weeks? ---No Does the PT have any chronic conditions? (i.e. diabetes, asthma, this includes High risk factors for pregnancy, etc.) ---Yes List chronic conditions. ---ALS, prone to pneumonia, DM, HTN Is this a behavioral health or substance abuse call? ---No Guidelines Guideline Title Affirmed Question Affirmed Notes Nurse Date/Time (Eastern Time) COVID-19 - Diagnosed or Suspected MILD difficulty breathing (e.g., minimal/no SOB at rest, SOB with walking, pulse <100) Hubbard, RN, April 03/19/2020 8:33:43 AM Disp. Time Lamount Cohen Time) Disposition Final User 03/19/2020 8:37:53 AM See HCP within 4 Hours (or PCP triage) Yes Williams Che, RN, April PLEASE NOTE:  All timestamps contained within this report are represented as [place] Standard Time. CONFIDENTIALTY NOTICE: This fax transmission is intended only for the addressee. It contains information that is legally privileged, confidential or otherwise protected from use or disclosure. If you are not the intended recipient, you are strictly prohibited from reviewing, disclosing, copying using or disseminating any of this information or taking any action in reliance on or regarding this information. If you have received this fax in error, please notify us immediately by telephone so that we can arrange for its return to [place]. Phone: [phone], Toll-Free: [phone], Fax: [phone] Page: 2 of 2 Call Id: [id number] Caller Disagree/Comply Comply Caller Understands Yes PreDisposition Go to Urgent Care/Walk-In Clinic Care Advice Given Per Guideline SEE HCP (OR PCP TRIAGE) WITHIN 4 HOURS: * IF OFFICE WILL BE OPEN: You need to be seen within the next 3 or 4 hours. Call your doctor (or NP/PA) now or as soon as the office opens. CALL BACK IF: * You become worse Referrals [facility] - UC  Patient seen at [facility] on 03/19/2020, please see note. Called patient to get update. Patient stated that she was diagnosed with a URI at the UC and was given Robitussin DM and an inhaler to use and this appears to be helping. Patient denies SOB and fever. COVID-19 results still pending from UC. Instructed patient to continue to isolate until results come back. Patient verbalized understanding. Patient is requesting medication to help with her cough at night, so that she can get some sleep. Patient states that her cough becomes worse at night. Instructed patient to call back if she is not feeling better. UC and ED precautions given. Patient verbalized understanding.

## 2020-03-23 LAB — NOVEL CORONAVIRUS, NAA: SARS-CoV-2, NAA: NOT DETECTED

## 2020-03-31 ENCOUNTER — Other Ambulatory Visit: Payer: Self-pay

## 2020-03-31 DIAGNOSIS — E1140 Type 2 diabetes mellitus with diabetic neuropathy, unspecified: Secondary | ICD-10-CM

## 2020-03-31 MED ORDER — GABAPENTIN 300 MG PO CAPS: 600.0000 mg | ORAL_CAPSULE | Freq: Two times a day (BID) | ORAL | 0 refills | Status: DC

## 2020-03-31 NOTE — Telephone Encounter (Signed)
Refills sent to pharmacy. 

## 2020-03-31 NOTE — Telephone Encounter (Signed)
Pharmacy requests refill on: Gabapentin 300 mg   LAST REFILL: 12/12/2019 (Q-360, R-0) LAST OV: 10/01/2019 NEXT OV: Not Scheduled  PHARMACY: [place]

## 2020-04-14 ENCOUNTER — Other Ambulatory Visit: Payer: Self-pay

## 2020-04-14 DIAGNOSIS — I10 Essential (primary) hypertension: Secondary | ICD-10-CM

## 2020-04-14 MED ORDER — HYDROCHLOROTHIAZIDE 25 MG PO TABS: ORAL_TABLET | ORAL | 0 refills | Status: DC

## 2020-04-26 ENCOUNTER — Other Ambulatory Visit: Payer: Self-pay

## 2020-04-26 DIAGNOSIS — J3489 Other specified disorders of nose and nasal sinuses: Secondary | ICD-10-CM

## 2020-04-26 DIAGNOSIS — F419 Anxiety disorder, unspecified: Secondary | ICD-10-CM

## 2020-04-26 MED ORDER — FLUOXETINE HCL 40 MG PO CAPS: ORAL_CAPSULE | ORAL | 0 refills | Status: DC

## 2020-05-12 ENCOUNTER — Other Ambulatory Visit: Payer: Self-pay

## 2020-05-12 DIAGNOSIS — E1140 Type 2 diabetes mellitus with diabetic neuropathy, unspecified: Secondary | ICD-10-CM

## 2020-05-12 DIAGNOSIS — Z794 Long term (current) use of insulin: Secondary | ICD-10-CM

## 2020-05-12 MED ORDER — EMPAGLIFLOZIN 25 MG PO TABS: 25.0000 mg | ORAL_TABLET | Freq: Every day | ORAL | 1 refills | Status: DC

## 2020-05-18 LAB — HEMOGLOBIN A1C: Hemoglobin A1C: 7.1

## 2020-05-20 ENCOUNTER — Ambulatory Visit: Payer: Medicare PPO | Admitting: Primary Care

## 2020-05-25 ENCOUNTER — Ambulatory Visit: Payer: Medicare PPO | Admitting: Primary Care

## 2020-05-25 ENCOUNTER — Encounter: Payer: Self-pay | Admitting: Primary Care

## 2020-05-25 ENCOUNTER — Other Ambulatory Visit: Payer: Self-pay

## 2020-05-25 VITALS — BP 132/74 | HR 81 | Temp 98.6°F | Ht 65.6 in

## 2020-05-25 DIAGNOSIS — I10 Essential (primary) hypertension: Secondary | ICD-10-CM | POA: Diagnosis not present

## 2020-05-25 DIAGNOSIS — Z1231 Encounter for screening mammogram for malignant neoplasm of breast: Secondary | ICD-10-CM

## 2020-05-25 DIAGNOSIS — J309 Allergic rhinitis, unspecified: Secondary | ICD-10-CM

## 2020-05-25 DIAGNOSIS — Z23 Encounter for immunization: Secondary | ICD-10-CM

## 2020-05-25 DIAGNOSIS — E559 Vitamin D deficiency, unspecified: Secondary | ICD-10-CM

## 2020-05-25 DIAGNOSIS — J4520 Mild intermittent asthma, uncomplicated: Secondary | ICD-10-CM

## 2020-05-25 DIAGNOSIS — R7989 Other specified abnormal findings of blood chemistry: Secondary | ICD-10-CM

## 2020-05-25 DIAGNOSIS — J3489 Other specified disorders of nose and nasal sinuses: Secondary | ICD-10-CM

## 2020-05-25 DIAGNOSIS — E782 Mixed hyperlipidemia: Secondary | ICD-10-CM

## 2020-05-25 DIAGNOSIS — R29898 Other symptoms and signs involving the musculoskeletal system: Secondary | ICD-10-CM

## 2020-05-25 DIAGNOSIS — E1165 Type 2 diabetes mellitus with hyperglycemia: Secondary | ICD-10-CM

## 2020-05-25 DIAGNOSIS — E1140 Type 2 diabetes mellitus with diabetic neuropathy, unspecified: Secondary | ICD-10-CM | POA: Diagnosis not present

## 2020-05-25 DIAGNOSIS — F419 Anxiety disorder, unspecified: Secondary | ICD-10-CM

## 2020-05-25 DIAGNOSIS — Z794 Long term (current) use of insulin: Secondary | ICD-10-CM

## 2020-05-25 LAB — COMPREHENSIVE METABOLIC PANEL
ALT: 26 U/L (ref 0–35)
AST: 24 U/L (ref 0–37)
Albumin: 3.7 g/dL (ref 3.5–5.2)
Alkaline Phosphatase: 50 U/L (ref 39–117)
BUN: 20 mg/dL (ref 6–23)
CO2: 30 mEq/L (ref 19–32)
Calcium: 9.3 mg/dL (ref 8.4–10.5)
Chloride: 104 mEq/L (ref 96–112)
Creatinine, Ser: 0.9 mg/dL (ref 0.40–1.20)
GFR: 71.76 mL/min (ref >60.00)
Glucose, Bld: 126 mg/dL — ABNORMAL HIGH (ref 70–99)
Potassium: 4.8 mEq/L (ref 3.5–5.1)
Sodium: 143 mEq/L (ref 135–145)
Total Bilirubin: 0.4 mg/dL (ref 0.2–1.2)
Total Protein: 6.4 g/dL (ref 6.0–8.3)

## 2020-05-25 LAB — LIPID PANEL
Cholesterol: 164 mg/dL (ref 0–200)
HDL: 53.1 mg/dL (ref >39.00)
NonHDL: 111.24
Total CHOL/HDL Ratio: 3
Triglycerides: 208 mg/dL — ABNORMAL HIGH (ref 0.0–149.0)
VLDL: 41.6 mg/dL — ABNORMAL HIGH (ref 0.0–40.0)

## 2020-05-25 LAB — CBC
HCT: 42 % (ref 36.0–46.0)
Hemoglobin: 14.1 g/dL (ref 12.0–15.0)
MCHC: 33.5 g/dL (ref 30.0–36.0)
MCV: 89.4 fl (ref 78.0–100.0)
Platelets: 264 10*3/uL (ref 150.0–400.0)
RBC: 4.7 Mil/uL (ref 3.87–5.11)
RDW: 14.3 % (ref 11.5–15.5)
WBC: 6.4 10*3/uL (ref 4.0–10.5)

## 2020-05-25 LAB — VITAMIN D 25 HYDROXY (VIT D DEFICIENCY, FRACTURES): VITD: 27.98 ng/mL — ABNORMAL LOW (ref 30.00–100.00)

## 2020-05-25 LAB — TSH: TSH: 4.14 u[IU]/mL (ref 0.35–4.50)

## 2020-05-25 LAB — LDL CHOLESTEROL, DIRECT: Direct LDL: 72 mg/dL

## 2020-05-25 MED ORDER — FLUTICASONE PROPIONATE 50 MCG/ACT NA SUSP: 1.0000 | Freq: Two times a day (BID) | NASAL | 1 refills | Status: AC

## 2020-05-25 MED ORDER — TETANUS-DIPHTH-ACELL PERTUSSIS 5-2-15.5 LF-MCG/0.5 IM SUSP: 0.5000 mL | Freq: Once | INTRAMUSCULAR | 0 refills | Status: AC

## 2020-05-25 NOTE — Assessment & Plan Note (Signed)
Repeat TSH pending

## 2020-05-25 NOTE — Assessment & Plan Note (Signed)
Chronic and ongoing, continues to follow with neurology through [facility].

## 2020-05-25 NOTE — Assessment & Plan Note (Signed)
Doing well on fluoxetine 40 mg daily, continue same.

## 2020-05-25 NOTE — Assessment & Plan Note (Addendum)
Following with endocrinology through [facility], recent A1c of 7.1, see care everywhere.  Commended her on improvement in diabetes.  Continue Tresiba 55 units twice daily, Trulicity 3 mg weekly, Jardiance today 5 mg daily.  Continue gabapentin 600 milligrams twice daily for neuropathy.

## 2020-05-25 NOTE — Progress Notes (Signed)
Subjective:    Patient ID: [name], female    DOB: 08-Jul-1965, 55 y.o.   MRN: [id number]  HPI  This visit occurred during the SARS-CoV-2 public health emergency.  Safety protocols were in place, including screening questions prior to the visit, additional usage of staff PPE, and extensive cleaning of exam room while observing appropriate contact time as indicated for disinfecting solutions.   [name] is a 55 year old female with a history of hypertension, migraines, uncontrolled type 2 diabetes, lower extremity weakness, anxiety, chronic pain syndrome who presents today for follow-up of medical conditions.  1) Type 2 Diabetes:   Current medications include: Trulicity 3 mg weekly, Tresiba 55 units BID, Novolog 22-26 units, Jardiance 25 mg. Follows with Endocrinology  Last A1C: 10.6 in July 2021, 9.19 February 2020, 7.1 last week. Last Eye Exam: Due Last Foot Exam: Due Pneumonia Vaccination: 2018 ACE/ARB: Losartan Statin: Atorvastatin  2) Lower Extremity Weakness: Following with Neurology through [facility], no longer involved with physical or occupational therapy. Uses walker at home, able to walk short distances.   3) Essential Hypertension: Currently managed on hydrochlorothiazide 25 mg, losartan 100 mg, metoprolol tartrate 25 mg twice daily. She does not check BP home. She denies chest pain.   BP Readings from Last 3 Encounters:  05/25/20 132/74  03/19/20 (!) 167/70  10/01/19 126/76    4) Anxiety/Depression: Currently managed on fluoxetine 40 mg. Overall feels well managed on this regimen.   5) Asthma/Allergies: Currently managed on albuterol inhaler for which she uses sparingly as needed. Also using Flonase PRN, especially during seasonal changes.   Review of Systems  Respiratory: Negative for shortness of breath.   Cardiovascular: Negative for chest pain.  Allergic/Immunologic: Positive for environmental allergies.  Neurological: Positive for weakness.   Psychiatric/Behavioral: The patient is not nervous/anxious.        Past Medical History:  Diagnosis Date  . Allergy   . Amyotrophic lateral sclerosis (ALS) ([place]) 09/23/2018  . Anemia   . Anxiety   . Arthritis   . Asthma   . Colon polyp   . Complication of anesthesia   . Diabetes mellitus   . Displaced fracture of proximal end of right fibula 07/21/2017  . Edema   . Fatigue   . Hypertension   . IBS (irritable bowel syndrome)   . Lump in female breast   . Methicillin resistant Staphylococcus aureus in conditions classified elsewhere and of unspecified site   . Migraines   . Morbid obesity ([place])   . Night sweats   . Nonspecific abnormal results of thyroid function study   . Other B-complex deficiencies   . Paresthesias   . PONV (postoperative nausea and vomiting)   . Pure hypercholesterolemia   . Reflux   . Sacral fracture ([place])   . Sinus problem   . Symptomatic states associated with artificial menopause   . Thyroid disease   . Type II or unspecified type diabetes mellitus without mention of complication, not stated as uncontrolled   . Unspecified sleep apnea      Social History   Socioeconomic History  . Marital status: Married    Spouse name: Not on file  . Number of children: 3  . Years of education: college  . Highest education level: Not on file  Occupational History  . Occupation: Data processing manager: [employer]  Tobacco Use  . Smoking status: Never Smoker  . Smokeless tobacco: Never Used  Vaping Use  .  Vaping Use: Never used  Substance and Sexual Activity  . Alcohol use: Yes    Alcohol/week: 0.0 standard drinks    Comment: rarely - once per week at most-nothing for the past year 01/31/18  . Drug use: No  . Sexual activity: Never    Birth control/protection: None  Other Topics Concern  . Not on file  Social History Narrative   Exercise: walking two days per week, 30 minutes.       Married x 29 years, happily married; no abuse.        Children: 3 children; 1 grandchild; two step grandchildren; 1 gg.      Lives: with husband, mother-in-law.      Employment:  [employer] x 1996; Product manager.  [employer] work.      Tobacco: never      Alcohol: socially.  One glass of wine per month.      Exercise:  Walking around campus.        Seatbelt: 100%      Guns: gun safe in garage.   Social Determinants of Health   Financial Resource Strain: Not on file  Food Insecurity: Not on file  Transportation Needs: Not on file  Physical Activity: Not on file  Stress: Not on file  Social Connections: Not on file  Intimate Partner Violence: Not on file    Past Surgical History:  Procedure Laterality Date  . abcess removal  1997   MRSA  . ABDOMINAL HYSTERECTOMY  1997   complete in 1997, partial was in 1995  . carpal tunnel release r  07/03/13   [place]  . [place]  . CHOLECYSTECTOMY  03/1989  . COLONOSCOPY  03/21/2011   Normal.  [facility].  [name] KNEE SURGERY  2006  . LAPAROSCOPIC GASTRIC BANDING  05/2009  . TONSILLECTOMY AND ADENOIDECTOMY  1974  . TUBAL LIGATION    . ULNAR TUNNEL RELEASE Right 02/05/2018   Procedure: CUBITAL TUNNEL RELEASE/DECOMPRESSION;  Surgeon: Melrose Nakayama, MD;  Location: [facility];  Service: Orthopedics;  Laterality: Right;    Family History  Problem Relation Age of Onset  . Diabetes Mother   . Heart disease Mother 83       CHF, CAD  . Other Mother        muscle disease  . Hypertension Mother   . Hyperlipidemia Mother   . Arthritis Mother        OA hip L s/p THR  . Leukemia Mother 40  . Diabetes Father   . Heart disease Father        AMI/CABG/valve replacement age 17.  . Stroke Father   . Diabetes Brother   . Heart disease Brother        stent age 56.  . Fibromyalgia Daughter   . Crohn's disease Daughter   . Hypertension Son   . Alzheimer's disease Maternal Grandmother   . Emphysema Maternal Grandfather   . Heart disease Paternal Grandmother   . Breast cancer Other        2  Aunts    Allergies  Allergen Reactions  . Amoxicillin Shortness Of Breath and Rash  . Bee Venom Anaphylaxis  . Levofloxacin Shortness Of Breath and Rash  . Meloxicam Hives and Rash  . Vancomycin Anaphylaxis  . Sulfa Antibiotics Rash  . Ace Inhibitors Cough  . Codeine   . Meperidine Hcl   . Oxycodone-Acetaminophen   . Penicillins Swelling and Rash    Has patient had a PCN  reaction causing immediate rash, facial/tongue/throat swelling, SOB or lightheadedness with hypotension: Yes Has patient had a PCN reaction causing severe rash involving mucus membranes or skin necrosis: Yes Has patient had a PCN reaction that required hospitalization: No Has patient had a PCN reaction occurring within the last 10 years: No If all of the above answers are "NO", then may proceed with Cephalosporin use.   Flo Shanks [Gatifloxacin] Rash    Current Outpatient Medications on File Prior to Visit  Medication Sig Dispense Refill  . Continuous Blood Gluc Receiver (DEXCOM G6 RECEIVER) DEVI Use as directed for continuous glucose monitoring.    . Continuous Blood Gluc Transmit (DEXCOM G6 TRANSMITTER) MISC Use as directed for continuous glucose monitoring. Reuse transmitter for 90 days then discard and replace.    . Dulaglutide (TRULICITY) 3 TJ/0.3ES SOPN Inject into the skin.    . Glucagon 3 MG/DOSE POWD Place into the nose.    . insulin degludec (TRESIBA) 100 UNIT/ML FlexTouch Pen Inject into the skin.    [name] acetaminophen (TYLENOL) 650 MG CR tablet Take 650 mg by mouth at bedtime.    [name] albuterol (PROVENTIL) (2.5 MG/3ML) 0.083% nebulizer solution Take 3 mLs (2.5 mg total) by nebulization every 6 (six) hours as needed for wheezing or shortness of breath. 75 mL 12  . albuterol (VENTOLIN HFA) 108 (90 Base) MCG/ACT inhaler Inhale 1-2 puffs into the lungs every 6 (six) hours as needed for wheezing or shortness of breath. 18 g 0  . atorvastatin (LIPITOR) 40 MG tablet Take 1 tablet (40 mg total) by mouth daily at 6 PM.  90 tablet 1  . empagliflozin (JARDIANCE) 25 MG TABS tablet Take 1 tablet (25 mg total) by mouth daily before breakfast. For diabetes. 90 tablet 1  . FLUoxetine (PROZAC) 40 MG capsule TAKE 1 CAPSULE BY MOUTH EVERY DAY 90 capsule 0  . gabapentin (NEURONTIN) 300 MG capsule Take 2 capsules (600 mg total) by mouth 2 (two) times daily. 360 capsule 0  . guaiFENesin-dextromethorphan (ROBITUSSIN DM) 100-10 MG/5ML syrup Take 5 mLs by mouth every 4 (four) hours as needed for cough. 118 mL 0  . hydrochlorothiazide (HYDRODIURIL) 25 MG tablet Take 1/2 tab by mouth daily 45 tablet 0  . insulin aspart (NOVOLOG FLEXPEN) 100 UNIT/ML FlexPen Inject 22-26 Units into the skin 3 (three) times daily with meals. 30 mL 5  . insulin detemir (LEVEMIR FLEXTOUCH) 100 UNIT/ML FlexPen INJECT 55 UNITS IN THE MORNING AND 55 UNITS IN THE EVENING FOR DIABETES 45 mL 0  . Insulin Pen Needle 32G X 4 MM MISC Use 1 needle with pen as directed 100 each 11  . ketoconazole (NIZORAL) 2 % cream APPLY TO AFFECTED AREA EVERY DAY AS NEEDED FOR IRRITATION 60 g 0  . levocetirizine (XYZAL) 5 MG tablet TAKE ONE TABLET BY MOUTH EVERY EVENING (Patient taking differently: TAKE ONE TABLET (5 mg)  BY MOUTH EVERY EVENING) 90 tablet 1  . losartan (COZAAR) 100 MG tablet TAKE 1/2 TABLET BY MOUTH EVERY DAY 45 tablet 3  . metoprolol tartrate (LOPRESSOR) 25 MG tablet TAKE 0.5 TABLETS (12.5 MG TOTAL) BY MOUTH 2 (TWO) TIMES DAILY. FOR BLOOD PRESSURE. 90 tablet 1  . Multiple Vitamin (MULTIVITAMIN PO) Take 1 tablet by mouth daily.     . mupirocin ointment (BACTROBAN) 2 % Place 1 application into the nose 2 (two) times daily. 15 g 0  . ondansetron (ZOFRAN-ODT) 4 MG disintegrating tablet TAKE 1 TABLET BY MOUTH EVERY 8 HOURS AS NEEDED FOR NAUSEA AS DIRECTED.  20 tablet 0  . VALERIAN ROOT PO Take 1,000 mg by mouth at bedtime.     No current facility-administered medications on file prior to visit.    BP 132/74   Pulse 81   Temp 98.6 F (37 C) (Temporal)   Ht 5'  5.6" (1.666 m)   SpO2 98%   BMI 45.26 kg/m    Objective:   Physical Exam Constitutional:      Appearance: She is well-nourished.  Cardiovascular:     Rate and Rhythm: Normal rate and regular rhythm.  Pulmonary:     Effort: Pulmonary effort is normal.     Breath sounds: Normal breath sounds.  Musculoskeletal:     Cervical back: Neck supple.  Skin:    General: Skin is warm and dry.  Psychiatric:        Mood and Affect: Mood and affect and mood normal.            Assessment & Plan:

## 2020-05-25 NOTE — Assessment & Plan Note (Signed)
Compliant to atorvastatin 40 mg daily, continue same. Repeat lipid panel pending.  

## 2020-05-25 NOTE — Assessment & Plan Note (Signed)
Stable in the office today, continue losartan 100 mg, metoprolol tartrate 25 mg twice daily, HCTZ 25 mg.  CMP pending.

## 2020-05-25 NOTE — Assessment & Plan Note (Signed)
Chronic, seasonal. Refill provided for Flonase.

## 2020-05-25 NOTE — Patient Instructions (Signed)
Take the tetanus vaccine to the pharmacy for administration.  Stop by the lab prior to leaving today. I will notify you of your results once received.   [facility] your [facility].   It was a pleasure to see you today!

## 2020-05-25 NOTE — Assessment & Plan Note (Signed)
Repeat vitamin D level pending.

## 2020-05-25 NOTE — Assessment & Plan Note (Signed)
Stable, infrequent use of albuterol inhaler.  Continue same.

## 2020-05-26 DIAGNOSIS — R7989 Other specified abnormal findings of blood chemistry: Secondary | ICD-10-CM

## 2020-05-26 DIAGNOSIS — Z8349 Family history of other endocrine, nutritional and metabolic diseases: Secondary | ICD-10-CM

## 2020-06-15 ENCOUNTER — Ambulatory Visit: Payer: Medicare PPO

## 2020-06-17 ENCOUNTER — Encounter: Payer: Self-pay | Admitting: Primary Care

## 2020-06-24 ENCOUNTER — Ambulatory Visit
Admission: RE | Admit: 2020-06-24 | Discharge: 2020-06-24 | Disposition: A | Discharge status: Home / Self Care | Payer: Medicare PPO | Source: Ambulatory Visit | Attending: Primary Care | Admitting: Primary Care

## 2020-06-24 ENCOUNTER — Other Ambulatory Visit: Payer: Self-pay

## 2020-06-24 DIAGNOSIS — Z8349 Family history of other endocrine, nutritional and metabolic diseases: Secondary | ICD-10-CM | POA: Insufficient documentation

## 2020-06-24 DIAGNOSIS — R7989 Other specified abnormal findings of blood chemistry: Secondary | ICD-10-CM | POA: Diagnosis not present

## 2020-06-27 DIAGNOSIS — M62838 Other muscle spasm: Secondary | ICD-10-CM

## 2020-06-29 DIAGNOSIS — I6523 Occlusion and stenosis of bilateral carotid arteries: Secondary | ICD-10-CM

## 2020-06-29 MED ORDER — CYCLOBENZAPRINE HCL 10 MG PO TABS: 10.0000 mg | ORAL_TABLET | Freq: Three times a day (TID) | ORAL | 1 refills | Status: DC | PRN

## 2020-07-02 ENCOUNTER — Ambulatory Visit (HOSPITAL_COMMUNITY)
Admission: RE | Admit: 2020-07-02 | Discharge: 2020-07-02 | Disposition: A | Discharge status: Home / Self Care | Payer: Medicare PPO | Source: Ambulatory Visit | Attending: Cardiovascular Disease | Admitting: Cardiovascular Disease

## 2020-07-02 ENCOUNTER — Other Ambulatory Visit: Payer: Self-pay

## 2020-07-02 DIAGNOSIS — I6523 Occlusion and stenosis of bilateral carotid arteries: Secondary | ICD-10-CM | POA: Diagnosis present

## 2020-07-09 DIAGNOSIS — R000 Tachycardia, unspecified: Secondary | ICD-10-CM

## 2020-07-09 DIAGNOSIS — I10 Essential (primary) hypertension: Secondary | ICD-10-CM

## 2020-07-09 MED ORDER — METOPROLOL TARTRATE 25 MG PO TABS: 12.5000 mg | ORAL_TABLET | Freq: Two times a day (BID) | ORAL | 3 refills | Status: DC

## 2020-07-16 ENCOUNTER — Other Ambulatory Visit: Payer: Self-pay

## 2020-07-16 DIAGNOSIS — E1140 Type 2 diabetes mellitus with diabetic neuropathy, unspecified: Secondary | ICD-10-CM

## 2020-07-17 MED ORDER — GABAPENTIN 300 MG PO CAPS: 600.0000 mg | ORAL_CAPSULE | Freq: Two times a day (BID) | ORAL | 0 refills | Status: DC

## 2020-07-23 ENCOUNTER — Other Ambulatory Visit: Payer: Self-pay

## 2020-07-23 DIAGNOSIS — F419 Anxiety disorder, unspecified: Secondary | ICD-10-CM

## 2020-07-23 NOTE — Telephone Encounter (Signed)
Refill request Prozac Last refill 04/26/20 #90 Last office visit 05/25/20 No upcoming appointment

## 2020-07-26 ENCOUNTER — Telehealth: Payer: Medicare PPO | Admitting: Emergency Medicine

## 2020-07-26 DIAGNOSIS — J329 Chronic sinusitis, unspecified: Secondary | ICD-10-CM | POA: Diagnosis not present

## 2020-07-26 MED ORDER — FLUOXETINE HCL 40 MG PO CAPS: 40.0000 mg | ORAL_CAPSULE | Freq: Every day | ORAL | 3 refills | Status: DC

## 2020-07-26 MED ORDER — DOXYCYCLINE HYCLATE 100 MG PO CAPS: 100.0000 mg | ORAL_CAPSULE | Freq: Two times a day (BID) | ORAL | 0 refills | Status: DC

## 2020-07-26 NOTE — Progress Notes (Signed)
We are sorry that you are not feeling well.  Here is how we plan to help!  Based on what you have shared with me it looks like you have sinusitis.  Sinusitis is inflammation and infection in the sinus cavities of the head.  Based on your presentation I believe you most likely have Acute Bacterial Sinusitis.  This is an infection caused by bacteria and is treated with antibiotics. I have prescribed Doxycycline 100mg by mouth twice a day for 10 days. You may use an oral decongestant such as Mucinex D or if you have glaucoma or high blood pressure use plain Mucinex. Saline nasal spray help and can safely be used as often as needed for congestion.  If you develop worsening sinus pain, fever or notice severe headache and vision changes, or if symptoms are not better after completion of antibiotic, please schedule an appointment with a health care provider.    Sinus infections are not as easily transmitted as other respiratory infection, however we still recommend that you avoid close contact with loved ones, especially the very young and elderly.  Remember to wash your hands thoroughly throughout the day as this is the number one way to prevent the spread of infection!  Home Care:  Only take medications as instructed by your medical team.  Complete the entire course of an antibiotic.  Do not take these medications with alcohol.  A steam or ultrasonic humidifier can help congestion.  You can place a towel over your head and breathe in the steam from hot water coming from a faucet.  Avoid close contacts especially the very young and the elderly.  Cover your mouth when you cough or sneeze.  Always remember to wash your hands.  Get Help Right Away If:  You develop worsening fever or sinus pain.  You develop a severe head ache or visual changes.  Your symptoms persist after you have completed your treatment plan.  Make sure you  Understand these instructions.  Will watch your  condition.  Will get help right away if you are not doing well or get worse.  Your e-visit answers were reviewed by a board certified advanced clinical practitioner to complete your personal care plan.  Depending on the condition, your plan could have included both over the counter or prescription medications.  If there is a problem please reply  once you have received a response from your provider.  Your safety is important to us.  If you have drug allergies check your prescription carefully.    You can use MyChart to ask questions about today's visit, request a non-urgent call back, or ask for a work or school excuse for 24 hours related to this e-Visit. If it has been greater than 24 hours you will need to follow up with your provider, or enter a new e-Visit to address those concerns.  You will get an e-mail in the next two days asking about your experience.  I hope that your e-visit has been valuable and will speed your recovery. Thank you for using e-visits.   Approximately 5 minutes was used in reviewing the patient's chart, questionnaire, prescribing medications, and documentation.  

## 2020-08-09 DIAGNOSIS — E1165 Type 2 diabetes mellitus with hyperglycemia: Secondary | ICD-10-CM | POA: Diagnosis not present

## 2020-08-12 DIAGNOSIS — G1221 Amyotrophic lateral sclerosis: Secondary | ICD-10-CM | POA: Diagnosis not present

## 2020-08-24 DIAGNOSIS — E1165 Type 2 diabetes mellitus with hyperglycemia: Secondary | ICD-10-CM | POA: Diagnosis not present

## 2020-08-24 DIAGNOSIS — Z794 Long term (current) use of insulin: Secondary | ICD-10-CM | POA: Diagnosis not present

## 2020-09-01 ENCOUNTER — Other Ambulatory Visit: Payer: Self-pay

## 2020-09-01 DIAGNOSIS — R238 Other skin changes: Secondary | ICD-10-CM

## 2020-09-02 MED ORDER — KETOCONAZOLE 2 % EX CREA: TOPICAL_CREAM | Freq: Every day | CUTANEOUS | 0 refills | Status: DC | PRN

## 2020-09-10 DIAGNOSIS — E119 Type 2 diabetes mellitus without complications: Secondary | ICD-10-CM | POA: Diagnosis not present

## 2020-09-10 DIAGNOSIS — Z0100 Encounter for examination of eyes and vision without abnormal findings: Secondary | ICD-10-CM | POA: Diagnosis not present

## 2020-09-10 LAB — HM DIABETES EYE EXAM

## 2020-09-12 DIAGNOSIS — G1221 Amyotrophic lateral sclerosis: Secondary | ICD-10-CM | POA: Diagnosis not present

## 2020-09-21 ENCOUNTER — Encounter: Payer: Self-pay | Admitting: Primary Care

## 2020-10-12 DIAGNOSIS — G1221 Amyotrophic lateral sclerosis: Secondary | ICD-10-CM | POA: Diagnosis not present

## 2020-10-15 ENCOUNTER — Other Ambulatory Visit: Payer: Self-pay

## 2020-10-15 ENCOUNTER — Other Ambulatory Visit: Payer: Self-pay | Admitting: Primary Care

## 2020-10-15 DIAGNOSIS — E1140 Type 2 diabetes mellitus with diabetic neuropathy, unspecified: Secondary | ICD-10-CM

## 2020-10-15 DIAGNOSIS — Z794 Long term (current) use of insulin: Secondary | ICD-10-CM

## 2020-10-15 DIAGNOSIS — M62838 Other muscle spasm: Secondary | ICD-10-CM

## 2020-10-20 DIAGNOSIS — Z86010 Personal history of colonic polyps: Secondary | ICD-10-CM | POA: Diagnosis not present

## 2020-10-20 DIAGNOSIS — R1031 Right lower quadrant pain: Secondary | ICD-10-CM | POA: Diagnosis not present

## 2020-10-20 DIAGNOSIS — K529 Noninfective gastroenteritis and colitis, unspecified: Secondary | ICD-10-CM | POA: Diagnosis not present

## 2020-10-28 DIAGNOSIS — E1165 Type 2 diabetes mellitus with hyperglycemia: Secondary | ICD-10-CM | POA: Diagnosis not present

## 2020-11-06 ENCOUNTER — Telehealth: Payer: Medicare PPO | Admitting: Family

## 2020-11-06 DIAGNOSIS — J019 Acute sinusitis, unspecified: Secondary | ICD-10-CM | POA: Diagnosis not present

## 2020-11-06 NOTE — Progress Notes (Signed)

## 2020-11-07 MED ORDER — DOXYCYCLINE HYCLATE 100 MG PO TABS: 100.0000 mg | ORAL_TABLET | Freq: Two times a day (BID) | ORAL | 0 refills | Status: DC

## 2020-11-07 NOTE — Addendum Note (Signed)
Addended by: Evelina Dun A on: 11/07/2020 11:07 AM   Modules accepted: Orders

## 2020-11-10 DIAGNOSIS — K5289 Other specified noninfective gastroenteritis and colitis: Secondary | ICD-10-CM | POA: Diagnosis not present

## 2020-11-10 DIAGNOSIS — K529 Noninfective gastroenteritis and colitis, unspecified: Secondary | ICD-10-CM | POA: Diagnosis not present

## 2020-11-10 DIAGNOSIS — R197 Diarrhea, unspecified: Secondary | ICD-10-CM | POA: Diagnosis not present

## 2020-11-10 DIAGNOSIS — K6389 Other specified diseases of intestine: Secondary | ICD-10-CM | POA: Diagnosis not present

## 2020-11-10 DIAGNOSIS — D124 Benign neoplasm of descending colon: Secondary | ICD-10-CM | POA: Diagnosis not present

## 2020-11-10 LAB — HM COLONOSCOPY

## 2020-11-11 ENCOUNTER — Ambulatory Visit
Admission: RE | Admit: 2020-11-11 | Discharge: 2020-11-11 | Disposition: A | Discharge status: Home / Self Care | Payer: Medicare PPO | Source: Ambulatory Visit | Attending: Primary Care | Admitting: Primary Care

## 2020-11-11 ENCOUNTER — Other Ambulatory Visit: Payer: Self-pay

## 2020-11-11 DIAGNOSIS — Z1231 Encounter for screening mammogram for malignant neoplasm of breast: Secondary | ICD-10-CM | POA: Diagnosis not present

## 2020-11-12 DIAGNOSIS — G1221 Amyotrophic lateral sclerosis: Secondary | ICD-10-CM | POA: Diagnosis not present

## 2020-11-16 DIAGNOSIS — D124 Benign neoplasm of descending colon: Secondary | ICD-10-CM | POA: Diagnosis not present

## 2020-11-16 DIAGNOSIS — K5289 Other specified noninfective gastroenteritis and colitis: Secondary | ICD-10-CM | POA: Diagnosis not present

## 2020-11-18 ENCOUNTER — Other Ambulatory Visit: Payer: Self-pay | Admitting: Primary Care

## 2020-11-18 DIAGNOSIS — I10 Essential (primary) hypertension: Secondary | ICD-10-CM

## 2020-11-19 MED ORDER — LOSARTAN POTASSIUM 50 MG PO TABS: 50.0000 mg | ORAL_TABLET | Freq: Every day | ORAL | 1 refills | Status: DC

## 2020-11-30 DIAGNOSIS — E1165 Type 2 diabetes mellitus with hyperglycemia: Secondary | ICD-10-CM | POA: Diagnosis not present

## 2020-11-30 DIAGNOSIS — Z79899 Other long term (current) drug therapy: Secondary | ICD-10-CM | POA: Diagnosis not present

## 2020-11-30 DIAGNOSIS — Z7984 Long term (current) use of oral hypoglycemic drugs: Secondary | ICD-10-CM | POA: Diagnosis not present

## 2020-11-30 DIAGNOSIS — E1140 Type 2 diabetes mellitus with diabetic neuropathy, unspecified: Secondary | ICD-10-CM | POA: Diagnosis not present

## 2020-11-30 DIAGNOSIS — Z794 Long term (current) use of insulin: Secondary | ICD-10-CM | POA: Diagnosis not present

## 2020-12-02 DIAGNOSIS — R531 Weakness: Secondary | ICD-10-CM | POA: Diagnosis not present

## 2020-12-13 DIAGNOSIS — G1221 Amyotrophic lateral sclerosis: Secondary | ICD-10-CM | POA: Diagnosis not present

## 2020-12-18 ENCOUNTER — Telehealth: Payer: Medicare PPO | Admitting: Nurse Practitioner

## 2020-12-18 DIAGNOSIS — N3000 Acute cystitis without hematuria: Secondary | ICD-10-CM

## 2020-12-18 MED ORDER — NITROFURANTOIN MONOHYD MACRO 100 MG PO CAPS: 100.0000 mg | ORAL_CAPSULE | Freq: Two times a day (BID) | ORAL | 0 refills | Status: DC

## 2020-12-18 NOTE — Progress Notes (Signed)

## 2020-12-30 ENCOUNTER — Other Ambulatory Visit: Payer: Self-pay | Admitting: Primary Care

## 2020-12-30 DIAGNOSIS — I10 Essential (primary) hypertension: Secondary | ICD-10-CM

## 2021-01-10 DIAGNOSIS — R531 Weakness: Secondary | ICD-10-CM | POA: Diagnosis not present

## 2021-01-10 DIAGNOSIS — R202 Paresthesia of skin: Secondary | ICD-10-CM | POA: Diagnosis not present

## 2021-01-12 DIAGNOSIS — K52839 Microscopic colitis, unspecified: Secondary | ICD-10-CM | POA: Diagnosis not present

## 2021-01-12 DIAGNOSIS — G1221 Amyotrophic lateral sclerosis: Secondary | ICD-10-CM | POA: Diagnosis not present

## 2021-01-21 ENCOUNTER — Ambulatory Visit (INDEPENDENT_AMBULATORY_CARE_PROVIDER_SITE_OTHER): Payer: Medicare PPO

## 2021-01-21 ENCOUNTER — Other Ambulatory Visit: Payer: Self-pay

## 2021-01-21 DIAGNOSIS — Z23 Encounter for immunization: Secondary | ICD-10-CM

## 2021-01-28 DIAGNOSIS — E1165 Type 2 diabetes mellitus with hyperglycemia: Secondary | ICD-10-CM | POA: Diagnosis not present

## 2021-01-31 ENCOUNTER — Other Ambulatory Visit: Payer: Self-pay | Admitting: Primary Care

## 2021-01-31 DIAGNOSIS — Z794 Long term (current) use of insulin: Secondary | ICD-10-CM

## 2021-01-31 DIAGNOSIS — E1140 Type 2 diabetes mellitus with diabetic neuropathy, unspecified: Secondary | ICD-10-CM

## 2021-02-01 ENCOUNTER — Telehealth: Payer: Medicare PPO | Admitting: Physician Assistant

## 2021-02-01 DIAGNOSIS — R300 Dysuria: Secondary | ICD-10-CM

## 2021-02-01 MED ORDER — NITROFURANTOIN MONOHYD MACRO 100 MG PO CAPS: 100.0000 mg | ORAL_CAPSULE | Freq: Two times a day (BID) | ORAL | 0 refills | Status: DC

## 2021-02-01 NOTE — Progress Notes (Signed)

## 2021-02-01 NOTE — Progress Notes (Signed)
I have spent 5 minutes in review of e-visit questionnaire, review and updating patient chart, medical decision making and response to patient.   Fable Huisman Cody Genevia Bouldin, PA-C    

## 2021-02-11 ENCOUNTER — Other Ambulatory Visit: Payer: Self-pay | Admitting: Primary Care

## 2021-02-11 DIAGNOSIS — E7800 Pure hypercholesterolemia, unspecified: Secondary | ICD-10-CM

## 2021-02-12 DIAGNOSIS — G1221 Amyotrophic lateral sclerosis: Secondary | ICD-10-CM | POA: Diagnosis not present

## 2021-02-15 ENCOUNTER — Other Ambulatory Visit: Payer: Self-pay | Admitting: Primary Care

## 2021-02-15 DIAGNOSIS — M62838 Other muscle spasm: Secondary | ICD-10-CM

## 2021-02-15 MED ORDER — CYCLOBENZAPRINE HCL 10 MG PO TABS: 10.0000 mg | ORAL_TABLET | Freq: Three times a day (TID) | ORAL | 0 refills | Status: DC | PRN

## 2021-03-01 DIAGNOSIS — Z7985 Long-term (current) use of injectable non-insulin antidiabetic drugs: Secondary | ICD-10-CM | POA: Diagnosis not present

## 2021-03-01 DIAGNOSIS — E11649 Type 2 diabetes mellitus with hypoglycemia without coma: Secondary | ICD-10-CM | POA: Diagnosis not present

## 2021-03-01 DIAGNOSIS — Z794 Long term (current) use of insulin: Secondary | ICD-10-CM | POA: Diagnosis not present

## 2021-03-01 DIAGNOSIS — E1140 Type 2 diabetes mellitus with diabetic neuropathy, unspecified: Secondary | ICD-10-CM | POA: Diagnosis not present

## 2021-03-01 DIAGNOSIS — Z7984 Long term (current) use of oral hypoglycemic drugs: Secondary | ICD-10-CM | POA: Diagnosis not present

## 2021-03-03 ENCOUNTER — Other Ambulatory Visit: Payer: Self-pay | Admitting: Primary Care

## 2021-03-03 DIAGNOSIS — I10 Essential (primary) hypertension: Secondary | ICD-10-CM

## 2021-03-09 DIAGNOSIS — G5622 Lesion of ulnar nerve, left upper limb: Secondary | ICD-10-CM | POA: Diagnosis not present

## 2021-03-09 DIAGNOSIS — G5602 Carpal tunnel syndrome, left upper limb: Secondary | ICD-10-CM | POA: Diagnosis not present

## 2021-03-09 DIAGNOSIS — E1142 Type 2 diabetes mellitus with diabetic polyneuropathy: Secondary | ICD-10-CM | POA: Diagnosis not present

## 2021-03-09 DIAGNOSIS — M25532 Pain in left wrist: Secondary | ICD-10-CM | POA: Diagnosis not present

## 2021-03-09 DIAGNOSIS — M25522 Pain in left elbow: Secondary | ICD-10-CM | POA: Diagnosis not present

## 2021-03-14 DIAGNOSIS — G1221 Amyotrophic lateral sclerosis: Secondary | ICD-10-CM | POA: Diagnosis not present

## 2021-03-15 DIAGNOSIS — G5602 Carpal tunnel syndrome, left upper limb: Secondary | ICD-10-CM | POA: Insufficient documentation

## 2021-03-15 DIAGNOSIS — G5622 Lesion of ulnar nerve, left upper limb: Secondary | ICD-10-CM | POA: Insufficient documentation

## 2021-04-14 DIAGNOSIS — G1221 Amyotrophic lateral sclerosis: Secondary | ICD-10-CM | POA: Diagnosis not present

## 2021-04-18 DIAGNOSIS — E1165 Type 2 diabetes mellitus with hyperglycemia: Secondary | ICD-10-CM | POA: Diagnosis not present

## 2021-05-03 ENCOUNTER — Other Ambulatory Visit: Payer: Self-pay | Admitting: Primary Care

## 2021-05-03 DIAGNOSIS — E1140 Type 2 diabetes mellitus with diabetic neuropathy, unspecified: Secondary | ICD-10-CM

## 2021-05-03 DIAGNOSIS — R197 Diarrhea, unspecified: Secondary | ICD-10-CM | POA: Diagnosis not present

## 2021-05-03 NOTE — Telephone Encounter (Signed)
Patient due for general follow-up in early to mid March, please schedule for that timeframe.

## 2021-05-06 NOTE — Telephone Encounter (Signed)
Called pt to schedule, pt states she will go on line and schedule the appt.

## 2021-05-15 DIAGNOSIS — G1221 Amyotrophic lateral sclerosis: Secondary | ICD-10-CM | POA: Diagnosis not present

## 2021-05-17 ENCOUNTER — Ambulatory Visit: Payer: Medicare PPO | Admitting: Nurse Practitioner

## 2021-05-17 ENCOUNTER — Other Ambulatory Visit: Payer: Self-pay

## 2021-05-17 ENCOUNTER — Encounter: Payer: Self-pay | Admitting: Nurse Practitioner

## 2021-05-17 VITALS — BP 138/86 | HR 106 | Temp 96.7°F | Resp 14

## 2021-05-17 DIAGNOSIS — L97509 Non-pressure chronic ulcer of other part of unspecified foot with unspecified severity: Secondary | ICD-10-CM

## 2021-05-17 DIAGNOSIS — L989 Disorder of the skin and subcutaneous tissue, unspecified: Secondary | ICD-10-CM | POA: Diagnosis not present

## 2021-05-17 HISTORY — DX: Non-pressure chronic ulcer of other part of unspecified foot with unspecified severity: L97.509

## 2021-05-17 HISTORY — DX: Disorder of the skin and subcutaneous tissue, unspecified: L98.9

## 2021-05-17 MED ORDER — MUPIROCIN CALCIUM 2 % EX CREA: 1.0000 "application " | TOPICAL_CREAM | Freq: Two times a day (BID) | CUTANEOUS | 0 refills | Status: AC

## 2021-05-17 NOTE — Assessment & Plan Note (Signed)
Patient presents for sore on foot.  She does have a history of uncontrolled diabetes mellitus.  Last A1c was 5.6.  Patient has decreased sensation in bilateral feet either due to neuropathy from diabetes or a possible diagnosis of ALS.  We will start patient on Bactroban ointment twice daily along with continuing soaking and cleaning foot.  We will have her come back in a week to make sure she is healing appropriately.  Patient is concerned that she has fingers that have had amputations in the past dealing with wounds and comorbid conditions.  Signs and symptoms reviewed when to seek care

## 2021-05-17 NOTE — Progress Notes (Addendum)
Acute Office Visit  Subjective:    Patient ID: [name], female    DOB: 06-Feb-1966, 56 y.o.   MRN: [id number]  Chief Complaint  Patient presents with   Sore on toe    Noticed it about 2 weeks ago, has been using tee tree oil, apstein salt water. Soreness present.     HPI Patient is in today for Sore on her toe  States that she has a sore on her left second toe. She thought that it use to be a flat mole. Daughter states that part of it was a scab and the other half a raised area. States that it is sore to the touch. Does not have the best feeling in her feet from either DM or a possible dx of ALS. States they really noticed it approx 2 weeks ago  Has been using tea tree oil and epsom salt with tea tree oil to help heal it and she does not think it is helping. They think that it has actually gotten bigger and has family members that required amputations  Past Medical History:  Diagnosis Date   Allergy    Amyotrophic lateral sclerosis (ALS) ([place]) 09/23/2018   Anemia    Anxiety    Arthritis    Asthma    Colon polyp    Complication of anesthesia    Diabetes mellitus    Displaced fracture of proximal end of right fibula 07/21/2017   Edema    Fatigue    Hypertension    IBS (irritable bowel syndrome)    Lump in female breast    Methicillin resistant Staphylococcus aureus in conditions classified elsewhere and of unspecified site    Migraines    Morbid obesity ([place])    Night sweats    Nonspecific abnormal results of thyroid function study    Other B-complex deficiencies    Paresthesias    PONV (postoperative nausea and vomiting)    Pure hypercholesterolemia    Reflux    Sacral fracture ([place])    Sinus problem    Symptomatic states associated with artificial menopause    Thyroid disease    Type II or unspecified type diabetes mellitus without mention of complication, not stated as uncontrolled    Unspecified sleep apnea     Past Surgical History:  Procedure  Laterality Date   abcess removal  1997   MRSA   ABDOMINAL HYSTERECTOMY  1997   complete in 1997, partial was in 1995   carpal tunnel release r  07/03/13   [place]  03/1989   COLONOSCOPY  03/21/2011   Normal.  [facility].   KNEE SURGERY  2006   LAPAROSCOPIC GASTRIC BANDING  05/2009   TONSILLECTOMY AND ADENOIDECTOMY  1974   TUBAL LIGATION     ULNAR TUNNEL RELEASE Right 02/05/2018   Procedure: CUBITAL TUNNEL RELEASE/DECOMPRESSION;  Surgeon: Melrose Nakayama, MD;  Location: [facility];  Service: Orthopedics;  Laterality: Right;    Family History  Problem Relation Age of Onset   Diabetes Mother    Heart disease Mother 74       CHF, CAD   Other Mother        muscle disease   Hypertension Mother    Hyperlipidemia Mother    Arthritis Mother        OA hip L s/p THR   Leukemia Mother 48   Diabetes Father    Heart disease Father  AMI/CABG/valve replacement age 8.   Stroke Father    Diabetes Brother    Heart disease Brother        stent age 58.   Fibromyalgia Daughter    Crohn's disease Daughter    Hypertension Son    Alzheimer's disease Maternal Grandmother    Emphysema Maternal Grandfather    Heart disease Paternal Grandmother    Breast cancer Other        2 Aunts    Social History   Socioeconomic History   Marital status: Married    Spouse name: Not on file   Number of children: 3   Years of education: college   Highest education level: Not on file  Occupational History   Occupation: Data processing manager: [employer]  Tobacco Use   Smoking status: Never   Smokeless tobacco: Never  Vaping Use   Vaping Use: Never used  Substance and Sexual Activity   Alcohol use: Yes    Alcohol/week: 0.0 standard drinks    Comment: rarely - once per week at most-nothing for the past year 01/31/18   Drug use: No   Sexual activity: Never    Birth control/protection: None  Other Topics Concern   Not on file  Social  History Narrative   Exercise: walking two days per week, 30 minutes.       Married x 29 years, happily married; no abuse.       Children: 3 children; 1 grandchild; two step grandchildren; 1 gg.      Lives: with husband, mother-in-law.      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      Tobacco: never      Alcohol: socially.  One glass of wine per month.      Exercise:  Walking around campus.        Seatbelt: 100%      Guns: gun safe in garage.   Social Determinants of Health   Financial Resource Strain: Not on file  Food Insecurity: Not on file  Transportation Needs: Not on file  Physical Activity: Not on file  Stress: Not on file  Social Connections: Not on file  Intimate Partner Violence: Not on file    Outpatient Medications Prior to Visit  Medication Sig Dispense Refill   acetaminophen (TYLENOL) 650 MG CR tablet Take 650 mg by mouth at bedtime.     albuterol (PROVENTIL) (2.5 MG/3ML) 0.083% nebulizer solution Take 3 mLs (2.5 mg total) by nebulization every 6 (six) hours as needed for wheezing or shortness of breath. 75 mL 12   albuterol (VENTOLIN HFA) 108 (90 Base) MCG/ACT inhaler Inhale 1-2 puffs into the lungs every 6 (six) hours as needed for wheezing or shortness of breath. 18 g 0   atorvastatin (LIPITOR) 40 MG tablet Take 1 tablet (40 mg total) by mouth daily. For cholesterol. Due in March for office visit. 90 tablet 0   budesonide (ENTOCORT EC) 3 MG 24 hr capsule Take by mouth.     colestipol (COLESTID) 1 g tablet Take by mouth.     Continuous Blood Gluc Receiver (DEXCOM G6 RECEIVER) DEVI Use as directed for continuous glucose monitoring.     Continuous Blood Gluc Transmit (DEXCOM G6 TRANSMITTER) MISC Use as directed for continuous glucose monitoring. Reuse transmitter for 90 days then discard and replace.     cyclobenzaprine (FLEXERIL) 10 MG tablet Take 1 tablet (10 mg total) by mouth 3 (three) times daily as needed for  muscle spasms. 270 tablet 0    cyclobenzaprine (FLEXERIL) 10 MG tablet 1 tablet     Dulaglutide (TRULICITY) 3 QA/8.3MH SOPN Inject into the skin.     empagliflozin (JARDIANCE) 25 MG TABS tablet Take 1 tablet (25 mg total) by mouth daily before breakfast. For diabetes. 90 tablet 1   ergocalciferol (VITAMIN D2) 1.25 MG (50000 UT) capsule Take by mouth.     fexofenadine (ALLEGRA) 180 MG tablet 1 tablet     FLUoxetine (PROZAC) 40 MG capsule Take 1 capsule (40 mg total) by mouth daily. For anxiety and depression 90 capsule 3   FLUoxetine (PROZAC) 40 MG capsule 1 capsule     fluticasone (FLONASE) 50 MCG/ACT nasal spray Place 1 spray into both nostrils 2 (two) times daily. 48 mL 1   fluticasone (FLONASE) 50 MCG/ACT nasal spray Place into the nose.     gabapentin (NEURONTIN) 300 MG capsule Take 2 capsules (600 mg total) by mouth 2 (two) times daily. For pain. Office visit required in March for further refills. 120 capsule 0   gabapentin (NEURONTIN) 300 MG capsule 2 in the morning and 2 at night     Glucagon 3 MG/DOSE POWD Place into the nose.     hydrochlorothiazide (HYDRODIURIL) 25 MG tablet TAKE ONE HALF TABLET BY MOUTH EVERY DAY for blood pressure. 45 tablet 1   insulin aspart (NOVOLOG FLEXPEN) 100 UNIT/ML FlexPen Inject 22-26 Units into the skin 3 (three) times daily with meals. 30 mL 5   insulin degludec (TRESIBA) 100 UNIT/ML FlexTouch Pen Inject into the skin.     Insulin Pen Needle 32G X 4 MM MISC Use 1 needle with pen as directed 100 each 11   ketoconazole (NIZORAL) 2 % cream Apply topically daily as needed for irritation. 60 g 0   ketoconazole (NIZORAL) 2 % cream Apply topically. As needed     levocetirizine (XYZAL) 5 MG tablet TAKE ONE TABLET BY MOUTH EVERY EVENING 90 tablet 1   levocetirizine (XYZAL) 5 MG tablet 1 tablet in the evening     losartan (COZAAR) 50 MG tablet Take 1 tablet (50 mg total) by mouth daily. For blood pressure. 90 tablet 1   metoprolol tartrate (LOPRESSOR) 25 MG tablet Take 0.5 tablets (12.5 mg total)  by mouth 2 (two) times daily. For blood pressure. 90 tablet 3   Multiple Vitamin (MULTIVITAMIN PO) Take 1 tablet by mouth daily.      mupirocin ointment (BACTROBAN) 2 % Place 1 application into the nose 2 (two) times daily. 15 g 0   ondansetron (ZOFRAN-ODT) 4 MG disintegrating tablet TAKE 1 TABLET BY MOUTH EVERY 8 HOURS AS NEEDED FOR NAUSEA AS DIRECTED. 20 tablet 0   VALERIAN ROOT PO Take 1,000 mg by mouth at bedtime.     nitrofurantoin, macrocrystal-monohydrate, (MACROBID) 100 MG capsule Take 1 capsule (100 mg total) by mouth 2 (two) times daily. 10 capsule 0   No facility-administered medications prior to visit.    Allergies  Allergen Reactions   Amoxicillin Shortness Of Breath and Rash   Bee Venom Anaphylaxis   Levofloxacin Shortness Of Breath and Rash   Meloxicam Hives and Rash   Vancomycin Anaphylaxis   Sulfa Antibiotics Rash   Ace Inhibitors Cough   Codeine    Meperidine Hcl    Oxycodone-Acetaminophen    Tyloxapol     Other reaction(s): vomiting/rash   Penicillins Swelling and Rash    Has patient had a PCN reaction causing immediate rash, facial/tongue/throat swelling, SOB or lightheadedness with  hypotension: Yes Has patient had a PCN reaction causing severe rash involving mucus membranes or skin necrosis: Yes Has patient had a PCN reaction that required hospitalization: No Has patient had a PCN reaction occurring within the last 10 years: No If all of the above answers are "NO", then may proceed with Cephalosporin use.    Tequin [Gatifloxacin] Rash    Review of Systems  Constitutional:  Negative for chills and fever.  Musculoskeletal:  Positive for arthralgias.  Skin:  Positive for color change and rash.      Objective:    Physical Exam Cardiovascular:     Rate and Rhythm: Normal rate and regular rhythm.     Heart sounds: Normal heart sounds.  Pulmonary:     Effort: Pulmonary effort is normal.     Breath sounds: Normal breath sounds.  Abdominal:     General:  Bowel sounds are normal.  Skin:    General: Skin is warm.     Capillary Refill: Capillary refill takes less than 2 seconds.     Findings: Lesion present.     Comments: 0.5 cm by 0.5 cm. Erythematous base that is not warm to touch. Does have tenderness to palpation      BP 138/86    Pulse (!) 106    Temp (!) 96.7 F (35.9 C)    Resp 14    SpO2 99%  Wt Readings from Last 3 Encounters:  02/05/18 277 lb (125.6 kg)  01/31/18 277 lb (125.6 kg)  07/21/17 281 lb 8.4 oz (127.7 kg)    Health Maintenance Due  Topic Date Due   Zoster Vaccines- Shingrix (1 of 2) Never done   PAP SMEAR-Modifier  08/28/2014   FOOT EXAM  02/28/2019   COVID-19 Vaccine (4 - Booster for Moderna series) 01/20/2020   HEMOGLOBIN A1C  11/18/2020   COLONOSCOPY (Pts 45-89yrs Insurance coverage will need to be confirmed)  03/20/2021    There are no preventive care reminders to display for this patient.   Lab Results  Component Value Date   TSH 4.14 05/25/2020   Lab Results  Component Value Date   WBC 6.4 05/25/2020   HGB 14.1 05/25/2020   HCT 42.0 05/25/2020   MCV 89.4 05/25/2020   PLT 264.0 05/25/2020   Lab Results  Component Value Date   NA 143 05/25/2020   K 4.8 05/25/2020   CO2 30 05/25/2020   GLUCOSE 126 (H) 05/25/2020   BUN 20 05/25/2020   CREATININE 0.90 05/25/2020   BILITOT 0.4 05/25/2020   ALKPHOS 50 05/25/2020   AST 24 05/25/2020   ALT 26 05/25/2020   PROT 6.4 05/25/2020   ALBUMIN 3.7 05/25/2020   CALCIUM 9.3 05/25/2020   ANIONGAP 12 01/31/2018   GFR 71.76 05/25/2020   Lab Results  Component Value Date   CHOL 164 05/25/2020   Lab Results  Component Value Date   HDL 53.10 05/25/2020   Lab Results  Component Value Date   LDLCALC NOT CALC 12/28/2014   Lab Results  Component Value Date   TRIG 208.0 (H) 05/25/2020   Lab Results  Component Value Date   CHOLHDL 3 05/25/2020   Lab Results  Component Value Date   HGBA1C 7.1 05/18/2020       Assessment & Plan:   Problem  List Items Addressed This Visit       Other   Sore on toe - Primary    Patient presents for sore on foot.  She does have a history  of uncontrolled diabetes mellitus.  Last A1c was 5.6.  Patient has decreased sensation in bilateral feet either due to neuropathy from diabetes or a possible diagnosis of ALS.  We will start patient on Bactroban ointment twice daily along with continuing soaking and cleaning foot.  We will have her come back in a week to make sure she is healing appropriately.  Patient is concerned that she has fingers that have had amputations in the past dealing with wounds and comorbid conditions.  Signs and symptoms reviewed when to seek care      Relevant Medications   mupirocin cream (BACTROBAN) 2 %     Meds ordered this encounter  Medications   mupirocin cream (BACTROBAN) 2 %    Sig: Apply 1 application topically 2 (two) times daily for 7 days.    Dispense:  14 g    Refill:  0    Order Specific Question:   Supervising Provider    Answer:   Loura Pardon A [[id number]   This visit occurred during the SARS-CoV-2 public health emergency.  Safety protocols were in place, including screening questions prior to the visit, additional usage of staff PPE, and extensive cleaning of exam room while observing appropriate contact time as indicated for disinfecting solutions.    Romilda Garret, NP

## 2021-05-17 NOTE — Patient Instructions (Signed)
Nice to see you today I sent in some cream for you to use on the toe. The scab may fall off Keep an eye on the redness and the size of it. We will see you back in 1 week, sooner if needed

## 2021-05-19 ENCOUNTER — Ambulatory Visit: Payer: Medicare PPO | Admitting: Primary Care

## 2021-05-24 ENCOUNTER — Other Ambulatory Visit: Payer: Self-pay

## 2021-05-24 ENCOUNTER — Ambulatory Visit: Payer: Medicare PPO | Admitting: Nurse Practitioner

## 2021-05-24 ENCOUNTER — Encounter: Payer: Self-pay | Admitting: Nurse Practitioner

## 2021-05-24 VITALS — BP 128/76 | HR 67 | Temp 96.9°F | Resp 14

## 2021-05-24 DIAGNOSIS — L989 Disorder of the skin and subcutaneous tissue, unspecified: Secondary | ICD-10-CM | POA: Diagnosis not present

## 2021-05-24 NOTE — Progress Notes (Signed)
Acute Office Visit  Subjective:    Patient ID: [name], female    DOB: 29-Mar-1965, 56 y.o.   MRN: [id number]  Chief Complaint  Patient presents with   Follow-up    On sore on toe-not much difference per patient    HPI Patient is in today for wound recheck  Patient is here for wound follow-up.  Patient was seen roughly 1 week ago for wound to left second toe.  Patient has been doing conservative treatments at home inclusive of keeping clean and dry and using Epsom salt soaks.  Patient was recently prescribed Bactroban ointment for twice daily use without improvement.  Patient does have several comorbidities.  Is present with spouse at bedside  Past Medical History:  Diagnosis Date   Allergy    Amyotrophic lateral sclerosis (ALS) ([place]) 09/23/2018   Anemia    Anxiety    Arthritis    Asthma    Colon polyp    Complication of anesthesia    Diabetes mellitus    Displaced fracture of proximal end of right fibula 07/21/2017   Edema    Fatigue    Hypertension    IBS (irritable bowel syndrome)    Lump in female breast    Methicillin resistant Staphylococcus aureus in conditions classified elsewhere and of unspecified site    Migraines    Morbid obesity ([place])    Night sweats    Nonspecific abnormal results of thyroid function study    Other B-complex deficiencies    Paresthesias    PONV (postoperative nausea and vomiting)    Pure hypercholesterolemia    Reflux    Sacral fracture ([place])    Sinus problem    Symptomatic states associated with artificial menopause    Thyroid disease    Type II or unspecified type diabetes mellitus without mention of complication, not stated as uncontrolled    Unspecified sleep apnea     Past Surgical History:  Procedure Laterality Date   abcess removal  1997   MRSA   ABDOMINAL HYSTERECTOMY  1997   complete in 1997, partial was in 1995   carpal tunnel release r  07/03/13   [facility]   03/1989   COLONOSCOPY  03/21/2011   Normal.  [facility].   KNEE SURGERY  2006   LAPAROSCOPIC GASTRIC BANDING  05/2009   TONSILLECTOMY AND ADENOIDECTOMY  1974   TUBAL LIGATION     ULNAR TUNNEL RELEASE Right 02/05/2018   Procedure: CUBITAL TUNNEL RELEASE/DECOMPRESSION;  Surgeon: Melrose Nakayama, MD;  Location: [facility];  Service: Orthopedics;  Laterality: Right;    Family History  Problem Relation Age of Onset   Diabetes Mother    Heart disease Mother 3       CHF, CAD   Other Mother        muscle disease   Hypertension Mother    Hyperlipidemia Mother    Arthritis Mother        OA hip L s/p THR   Leukemia Mother 26   Diabetes Father    Heart disease Father        AMI/CABG/valve replacement age 32.   Stroke Father    Diabetes Brother    Heart disease Brother        stent age 70.   Fibromyalgia Daughter    Crohn's disease Daughter    Hypertension Son    Alzheimer's disease Maternal Grandmother    Emphysema Maternal  Grandfather    Heart disease Paternal Grandmother    Breast cancer Other        2 Aunts    Social History   Socioeconomic History   Marital status: Married    Spouse name: Not on file   Number of children: 3   Years of education: college   Highest education level: Not on file  Occupational History   Occupation: Data processing manager: [employer]  Tobacco Use   Smoking status: Never   Smokeless tobacco: Never  Vaping Use   Vaping Use: Never used  Substance and Sexual Activity   Alcohol use: Yes    Alcohol/week: 0.0 standard drinks    Comment: rarely - once per week at most-nothing for the past year 01/31/18   Drug use: No   Sexual activity: Never    Birth control/protection: None  Other Topics Concern   Not on file  Social History Narrative   Exercise: walking two days per week, 30 minutes.       Married x 29 years, happily married; no abuse.       Children: 3 children; 1 grandchild; two step grandchildren; 1 gg.      Lives: with husband,  mother-in-law.      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      Tobacco: never      Alcohol: socially.  One glass of wine per month.      Exercise:  Walking around campus.        Seatbelt: 100%      Guns: gun safe in garage.   Social Determinants of Health   Financial Resource Strain: Not on file  Food Insecurity: Not on file  Transportation Needs: Not on file  Physical Activity: Not on file  Stress: Not on file  Social Connections: Not on file  Intimate Partner Violence: Not on file    Outpatient Medications Prior to Visit  Medication Sig Dispense Refill   acetaminophen (TYLENOL) 650 MG CR tablet Take 650 mg by mouth at bedtime.     albuterol (PROVENTIL) (2.5 MG/3ML) 0.083% nebulizer solution Take 3 mLs (2.5 mg total) by nebulization every 6 (six) hours as needed for wheezing or shortness of breath. 75 mL 12   albuterol (VENTOLIN HFA) 108 (90 Base) MCG/ACT inhaler Inhale 1-2 puffs into the lungs every 6 (six) hours as needed for wheezing or shortness of breath. 18 g 0   atorvastatin (LIPITOR) 40 MG tablet Take 1 tablet (40 mg total) by mouth daily. For cholesterol. Due in March for office visit. 90 tablet 0   budesonide (ENTOCORT EC) 3 MG 24 hr capsule Take by mouth.     colestipol (COLESTID) 1 g tablet Take by mouth.     Continuous Blood Gluc Receiver (DEXCOM G6 RECEIVER) DEVI Use as directed for continuous glucose monitoring.     Continuous Blood Gluc Transmit (DEXCOM G6 TRANSMITTER) MISC Use as directed for continuous glucose monitoring. Reuse transmitter for 90 days then discard and replace.     cyclobenzaprine (FLEXERIL) 10 MG tablet Take 1 tablet (10 mg total) by mouth 3 (three) times daily as needed for muscle spasms. 270 tablet 0   cyclobenzaprine (FLEXERIL) 10 MG tablet 1 tablet     Dulaglutide (TRULICITY) 3 HG/9.9ME SOPN Inject into the skin.     empagliflozin (JARDIANCE) 25 MG TABS tablet Take 1 tablet (25 mg total) by mouth daily before breakfast. For  diabetes. 90 tablet 1  ergocalciferol (VITAMIN D2) 1.25 MG (50000 UT) capsule Take by mouth.     fexofenadine (ALLEGRA) 180 MG tablet 1 tablet     FLUoxetine (PROZAC) 40 MG capsule Take 1 capsule (40 mg total) by mouth daily. For anxiety and depression 90 capsule 3   FLUoxetine (PROZAC) 40 MG capsule 1 capsule     fluticasone (FLONASE) 50 MCG/ACT nasal spray Place 1 spray into both nostrils 2 (two) times daily. 48 mL 1   fluticasone (FLONASE) 50 MCG/ACT nasal spray Place into the nose.     gabapentin (NEURONTIN) 300 MG capsule Take 2 capsules (600 mg total) by mouth 2 (two) times daily. For pain. Office visit required in March for further refills. 120 capsule 0   gabapentin (NEURONTIN) 300 MG capsule 2 in the morning and 2 at night     Glucagon 3 MG/DOSE POWD Place into the nose.     hydrochlorothiazide (HYDRODIURIL) 25 MG tablet TAKE ONE HALF TABLET BY MOUTH EVERY DAY for blood pressure. 45 tablet 1   insulin aspart (NOVOLOG FLEXPEN) 100 UNIT/ML FlexPen Inject 22-26 Units into the skin 3 (three) times daily with meals. 30 mL 5   insulin degludec (TRESIBA) 100 UNIT/ML FlexTouch Pen Inject into the skin.     Insulin Pen Needle 32G X 4 MM MISC Use 1 needle with pen as directed 100 each 11   ketoconazole (NIZORAL) 2 % cream Apply topically daily as needed for irritation. 60 g 0   ketoconazole (NIZORAL) 2 % cream Apply topically. As needed     levocetirizine (XYZAL) 5 MG tablet TAKE ONE TABLET BY MOUTH EVERY EVENING 90 tablet 1   levocetirizine (XYZAL) 5 MG tablet 1 tablet in the evening     losartan (COZAAR) 50 MG tablet Take 1 tablet (50 mg total) by mouth daily. For blood pressure. 90 tablet 1   metoprolol tartrate (LOPRESSOR) 25 MG tablet Take 0.5 tablets (12.5 mg total) by mouth 2 (two) times daily. For blood pressure. 90 tablet 3   Multiple Vitamin (MULTIVITAMIN PO) Take 1 tablet by mouth daily.      mupirocin cream (BACTROBAN) 2 % Apply 1 application topically 2 (two) times daily for 7 days.  14 g 0   ondansetron (ZOFRAN-ODT) 4 MG disintegrating tablet TAKE 1 TABLET BY MOUTH EVERY 8 HOURS AS NEEDED FOR NAUSEA AS DIRECTED. 20 tablet 0   VALERIAN ROOT PO Take 1,000 mg by mouth at bedtime.     No facility-administered medications prior to visit.    Allergies  Allergen Reactions   Amoxicillin Shortness Of Breath and Rash   Bee Venom Anaphylaxis   Levofloxacin Shortness Of Breath and Rash   Meloxicam Hives and Rash   Vancomycin Anaphylaxis   Sulfa Antibiotics Rash   Ace Inhibitors Cough   Codeine    Meperidine Hcl    Oxycodone-Acetaminophen    Tyloxapol     Other reaction(s): vomiting/rash   Penicillins Swelling and Rash    Has patient had a PCN reaction causing immediate rash, facial/tongue/throat swelling, SOB or lightheadedness with hypotension: Yes Has patient had a PCN reaction causing severe rash involving mucus membranes or skin necrosis: Yes Has patient had a PCN reaction that required hospitalization: No Has patient had a PCN reaction occurring within the last 10 years: No If all of the above answers are "NO", then may proceed with Cephalosporin use.    Tequin [Gatifloxacin] Rash    Review of Systems  Constitutional:  Negative for chills and fever.  Musculoskeletal:  Negative for arthralgias and joint swelling.  Skin:  Positive for color change and wound.      Objective:    Physical Exam Vitals and nursing note reviewed.  Constitutional:      Appearance: She is obese.  Cardiovascular:     Rate and Rhythm: Normal rate and regular rhythm.     Heart sounds: Normal heart sounds.  Pulmonary:     Effort: Pulmonary effort is normal.     Breath sounds: Normal breath sounds.  Skin:    Findings: Lesion present.     Comments: Place on second toe of left foot. See clinical picture still measuring 0.5 cm x 0.5cm  Neurological:     Mental Status: She is alert.     BP 128/76    Pulse 67    Temp (!) 96.9 F (36.1 C)    Resp 14    SpO2 97%  Wt Readings from  Last 3 Encounters:  02/05/18 277 lb (125.6 kg)  01/31/18 277 lb (125.6 kg)  07/21/17 281 lb 8.4 oz (127.7 kg)    Health Maintenance Due  Topic Date Due   Zoster Vaccines- Shingrix (1 of 2) Never done   PAP SMEAR-Modifier  08/28/2014   FOOT EXAM  02/28/2019   COVID-19 Vaccine (4 - Booster for Moderna series) 01/20/2020   HEMOGLOBIN A1C  11/18/2020   COLONOSCOPY (Pts 45-68yr Insurance coverage will need to be confirmed)  03/20/2021    There are no preventive care reminders to display for this patient.   Lab Results  Component Value Date   TSH 4.14 05/25/2020   Lab Results  Component Value Date   WBC 6.4 05/25/2020   HGB 14.1 05/25/2020   HCT 42.0 05/25/2020   MCV 89.4 05/25/2020   PLT 264.0 05/25/2020   Lab Results  Component Value Date   NA 143 05/25/2020   K 4.8 05/25/2020   CO2 30 05/25/2020   GLUCOSE 126 (H) 05/25/2020   BUN 20 05/25/2020   CREATININE 0.90 05/25/2020   BILITOT 0.4 05/25/2020   ALKPHOS 50 05/25/2020   AST 24 05/25/2020   ALT 26 05/25/2020   PROT 6.4 05/25/2020   ALBUMIN 3.7 05/25/2020   CALCIUM 9.3 05/25/2020   ANIONGAP 12 01/31/2018   GFR 71.76 05/25/2020   Lab Results  Component Value Date   CHOL 164 05/25/2020   Lab Results  Component Value Date   HDL 53.10 05/25/2020   Lab Results  Component Value Date   LDLCALC NOT CALC 12/28/2014   Lab Results  Component Value Date   TRIG 208.0 (H) 05/25/2020   Lab Results  Component Value Date   CHOLHDL 3 05/25/2020   Lab Results  Component Value Date   HGBA1C 7.1 05/18/2020       Assessment & Plan:   Problem List Items Addressed This Visit       Other   Sore on toe - Primary    Given no improvement in sore size,  We will refer patient to wound center.  Patient does have comorbidities of diabetes and decreased sensation in the bilateral feet.  See patient sugar was well controlled.  States she does have family history of folks having amputations due to a variety of conditions,  she does not want to elaborate.  Follow-up if it new acute symptoms present themselves.  Ambulatory referral clinic      Relevant Orders   Ambulatory referral to Wound Clinic     No orders of the defined types  were placed in this encounter.  This visit occurred during the SARS-CoV-2 public health emergency.  Safety protocols were in place, including screening questions prior to the visit, additional usage of staff PPE, and extensive cleaning of exam room while observing appropriate contact time as indicated for disinfecting solutions.    Romilda Garret, NP

## 2021-05-24 NOTE — Patient Instructions (Signed)
Nice to see yo today ?Since we are not making good progress with the wound we will get you in with the wound clinic ?Nice to see you today ? ?

## 2021-05-24 NOTE — Assessment & Plan Note (Signed)
Given no improvement in sore size,  We will refer patient to wound center.  Patient does have comorbidities of diabetes and decreased sensation in the bilateral feet.  See patient sugar was well controlled.  States she does have family history of folks having amputations due to a variety of conditions, she does not want to elaborate.  Follow-up if it new acute symptoms present themselves.  Ambulatory referral clinic ?

## 2021-05-26 ENCOUNTER — Ambulatory Visit: Payer: Medicare PPO | Admitting: Primary Care

## 2021-05-26 ENCOUNTER — Other Ambulatory Visit: Payer: Self-pay | Admitting: Primary Care

## 2021-05-30 ENCOUNTER — Other Ambulatory Visit: Payer: Self-pay | Admitting: Primary Care

## 2021-05-30 DIAGNOSIS — I10 Essential (primary) hypertension: Secondary | ICD-10-CM

## 2021-06-02 ENCOUNTER — Other Ambulatory Visit: Payer: Self-pay | Admitting: Primary Care

## 2021-06-02 MED ORDER — CYCLOBENZAPRINE HCL 10 MG PO TABS: 10.0000 mg | ORAL_TABLET | Freq: Three times a day (TID) | ORAL | 0 refills | Status: DC | PRN

## 2021-06-03 ENCOUNTER — Other Ambulatory Visit: Payer: Self-pay | Admitting: Primary Care

## 2021-06-03 DIAGNOSIS — E1140 Type 2 diabetes mellitus with diabetic neuropathy, unspecified: Secondary | ICD-10-CM

## 2021-06-05 MED ORDER — GABAPENTIN 300 MG PO CAPS: 600.0000 mg | ORAL_CAPSULE | Freq: Two times a day (BID) | ORAL | 0 refills | Status: DC

## 2021-06-06 ENCOUNTER — Other Ambulatory Visit: Payer: Self-pay

## 2021-06-06 ENCOUNTER — Encounter: Payer: Medicare PPO | Attending: Physician Assistant | Admitting: Physician Assistant

## 2021-06-06 DIAGNOSIS — E1151 Type 2 diabetes mellitus with diabetic peripheral angiopathy without gangrene: Secondary | ICD-10-CM | POA: Insufficient documentation

## 2021-06-06 DIAGNOSIS — E1140 Type 2 diabetes mellitus with diabetic neuropathy, unspecified: Secondary | ICD-10-CM | POA: Insufficient documentation

## 2021-06-06 DIAGNOSIS — M1990 Unspecified osteoarthritis, unspecified site: Secondary | ICD-10-CM | POA: Diagnosis not present

## 2021-06-06 DIAGNOSIS — I10 Essential (primary) hypertension: Secondary | ICD-10-CM | POA: Insufficient documentation

## 2021-06-06 DIAGNOSIS — E11621 Type 2 diabetes mellitus with foot ulcer: Secondary | ICD-10-CM | POA: Insufficient documentation

## 2021-06-06 DIAGNOSIS — J45909 Unspecified asthma, uncomplicated: Secondary | ICD-10-CM | POA: Diagnosis not present

## 2021-06-06 DIAGNOSIS — L89893 Pressure ulcer of other site, stage 3: Secondary | ICD-10-CM | POA: Diagnosis not present

## 2021-06-06 DIAGNOSIS — I890 Lymphedema, not elsewhere classified: Secondary | ICD-10-CM | POA: Diagnosis not present

## 2021-06-06 DIAGNOSIS — G4730 Sleep apnea, unspecified: Secondary | ICD-10-CM | POA: Diagnosis not present

## 2021-06-06 NOTE — Progress Notes (Signed)
SHAQUELLA, STAMANT ([id number]) ?Visit Report for 06/06/2021 ?Abuse Risk Screen Details ?Patient Name: [name], [name]. ?Date of Service: 06/06/2021 10:00 AM ?Medical Record Number: [id number] ?Patient Account Number: [id number] ?Date of Birth/Sex: [birth year] (56 y.o. F) ?Treating RN: Donnamarie Poag ?Primary Care Malayasia Mirkin: Alma Friendly Other Clinician: ?Referring Dontavia Brand: [name] ?Treating Aphrodite Harpenau/Extender: Jeri Cos ?Weeks in Treatment: 0 ?Abuse Risk Screen Items ?Answer ?ABUSE RISK SCREEN: ?Has anyone close to you tried to hurt or harm you recentlyo No ?Do you feel uncomfortable with anyone in your familyo No ?Has anyone forced you do things that you didnot want to doo No ?Electronic Signature(s) ?Signed: 06/06/2021 4:17:36 PM By: Donnamarie Poag ?Entered ByDonnamarie Poag on 06/06/2021 10:35:23 ?ANJANA, CHEEK P. ([id number]) ?-------------------------------------------------------------------------------- ?Activities of Daily Living Details ?Patient Name: [name], [name]. ?Date of Service: 06/06/2021 10:00 AM ?Medical Record Number: [id number] ?Patient Account Number: [id number] ?Date of Birth/Sex: September 08, 1965 (56 y.o. F) ?Treating RN: Donnamarie Poag ?Primary Care Litisha Guagliardo: Alma Friendly Other Clinician: ?Referring Jerrine Urschel: [name] ?Treating Kaydi Kley/Extender: Jeri Cos ?Weeks in Treatment: 0 ?Activities of Daily Living Items ?Answer ?Activities of Daily Living (Please select one for each item) ?Drive Automobile Not Able ?Take Medications Completely Able ?Use Telephone Completely Able ?Care for Appearance Need Assistance ?Use Toilet Need Assistance ?Bath / Shower Need Assistance ?Dress Self Need Assistance ?Feed Self Need Assistance ?Walk Need Assistance ?Get In / Out Bed Need Assistance ?Housework Not Able ?Prepare Meals Need Assistance ?Handle Money Completely Able ?Shop for Self Need Assistance ?Electronic Signature(s) ?Signed: 06/06/2021 4:17:36 PM By: Donnamarie Poag ?Entered ByDonnamarie Poag on 06/06/2021  10:36:05 ?ALIE, MOUDY P. ([id number]) ?-------------------------------------------------------------------------------- ?Education Screening Details ?Patient Name: [name], [name]. ?Date of Service: 06/06/2021 10:00 AM ?Medical Record Number: [id number] ?Patient Account Number: [id number] ?Date of Birth/Sex: [birth year] (56 y.o. F) ?Treating RN: Donnamarie Poag ?Primary Care Junnie Loschiavo: Alma Friendly Other Clinician: ?Referring Darrious Youman: [name] ?Treating Roque Schill/Extender: Jeri Cos ?Weeks in Treatment: 0 ?Primary Learner Assessed: Patient ?Learning Preferences/Education Level/Primary Language ?Learning Preference: Explanation ?Highest Education Level: College or Above ?Preferred Language: English ?Cognitive Barrier ?Language Barrier: No ?Translator Needed: No ?Memory Deficit: No ?Emotional Barrier: No ?Cultural/Religious Beliefs Affecting Medical Care: No ?Physical Barrier ?Impaired Vision: No ?Impaired Hearing: No ?Decreased Hand dexterity: No ?Knowledge/Comprehension ?Knowledge Level: High ?Comprehension Level: High ?Ability to understand written instructions: High ?Ability to understand verbal instructions: High ?Motivation ?Anxiety Level: Calm ?Cooperation: Cooperative ?Education Importance: Acknowledges Need ?Interest in Health Problems: Asks Questions ?Perception: Coherent ?Willingness to Engage in Self-Management ?High ?Activities: ?Readiness to Engage in Self-Management ?High ?Activities: ?Electronic Signature(s) ?Signed: 06/06/2021 4:17:36 PM By: Donnamarie Poag ?Entered ByDonnamarie Poag on 06/06/2021 10:36:37 ?JENNIFERANN, STUCKERT P. ([id number]) ?-------------------------------------------------------------------------------- ?Fall Risk Assessment Details ?Patient Name: [name], [name]. ?Date of Service: 06/06/2021 10:00 AM ?Medical Record Number: [id number] ?Patient Account Number: [id number] ?Date of Birth/Sex: 01-07-1966 (56 y.o. F) ?Treating RN: Donnamarie Poag ?Primary Care Daphyne Miguez: Alma Friendly Other  Clinician: ?Referring Kalayla Shadden: [name] ?Treating Tonnie Friedel/Extender: Jeri Cos ?Weeks in Treatment: 0 ?Fall Risk Assessment Items ?Have you had 2 or more falls in the last 12 monthso 0 No ?Have you had any fall that resulted in injury in the last 12 monthso 0 No ?FALLS RISK SCREEN ?History of falling - immediate or within 3 months 0 No ?Secondary diagnosis (Do you have 2 or more medical diagnoseso) 15 Yes ?Ambulatory aid ?None/bed rest/wheelchair/nurse 0 No ?Crutches/cane/walker 15 Yes ?Furniture 0 No ?Intravenous therapy Access/Saline/Heparin Lock 0 No ?Gait/Transferring ?Normal/ bed rest/ wheelchair 0 No ?Weak (short steps with or without shuffle,  stooped but able to lift head while walking, may ?10 Yes ?seek support from furniture) ?Impaired (short steps with shuffle, may have difficulty arising from chair, head down, impaired ?0 No ?balance) ?Mental Status ?Oriented to own ability 0 Yes ?Electronic Signature(s) ?Signed: 06/06/2021 4:17:36 PM By: Donnamarie Poag ?Entered ByDonnamarie Poag on 06/06/2021 10:36:52 ?PAMLA, PANGLE P. ([id number]) ?-------------------------------------------------------------------------------- ?Foot Assessment Details ?Patient Name: [name], [name]. ?Date of Service: 06/06/2021 10:00 AM ?Medical Record Number: [id number] ?Patient Account Number: [id number] ?Date of Birth/Sex: [birth year] (56 y.o. F) ?Treating RN: Donnamarie Poag ?Primary Care Jakelin Taussig: Alma Friendly Other Clinician: ?Referring Sacoya Mcgourty: [name] ?Treating Kaedyn Polivka/Extender: Jeri Cos ?Weeks in Treatment: 0 ?Foot Assessment Items ?Site Locations ?+ = Sensation present, - = Sensation absent, C = Callus, U = Ulcer ?R = Redness, W = Warmth, M = Maceration, PU = Pre-ulcerative lesion ?F = Fissure, S = Swelling, D = Dryness ?Assessment ?Right: Left: ?Other Deformity: No No ?Prior Foot Ulcer: No No ?Prior Amputation: No No ?Charcot Joint: No No ?Ambulatory Status: Ambulatory With Help ?Assistance Device:  Walker ?Gait: ?Electronic Signature(s) ?Signed: 06/06/2021 4:17:36 PM By: Donnamarie Poag ?Entered ByDonnamarie Poag on 06/06/2021 10:40:24 ?CYDNE, GRAHN P. ([id number]) ?-------------------------------------------------------------------------------- ?Nutrition Risk Screening Details ?Patient Name: [name], [name]. ?Date of Service: 06/06/2021 10:00 AM ?Medical Record Number: [id number] ?Patient Account Number: [id number] ?Date of Birth/Sex: 03/15/1966 (56 y.o. F) ?Treating RN: Donnamarie Poag ?Primary Care Christle Nolting: Alma Friendly Other Clinician: ?Referring Lautaro Koral: [name] ?Treating Matika Bartell/Extender: Jeri Cos ?Weeks in Treatment: 0 ?Height (in): 67 ?Weight (lbs): 301 ?Body Mass Index (BMI): 47.1 ?Nutrition Risk Screening Items ?Score Screening ?NUTRITION RISK SCREEN: ?I have an illness or condition that made me change the kind and/or amount of food I eat 0 No ?I eat fewer than two meals per day 0 No ?I eat few fruits and vegetables, or milk products 0 No ?I have three or more drinks of beer, liquor or wine almost every day 0 No ?I have tooth or mouth problems that make it hard for me to eat 0 No ?I don't always have enough money to buy the food I need 0 No ?I eat alone most of the time 0 No ?I take three or more different prescribed or over-the-counter drugs a day 1 Yes ?Without wanting to, I have lost or gained 10 pounds in the last six months 0 No ?I am not always physically able to shop, cook and/or feed myself 0 No ?Nutrition Protocols ?Good Risk Protocol 0 No interventions needed ?Moderate Risk Protocol ?High Risk Proctocol ?Risk Level: Good Risk ?Score: 1 ?Electronic Signature(s) ?Signed: 06/06/2021 4:17:36 PM By: Donnamarie Poag ?Entered ByDonnamarie Poag on 06/06/2021 10:37:05 ?

## 2021-06-06 NOTE — Progress Notes (Signed)
Maria, [name] ([id number]) ?Visit Report for 06/06/2021 ?Allergy List Details ?Patient Name: [name], [name]. ?Date of Service: 06/06/2021 10:00 AM ?Medical Record Number: [id number] ?Patient Account Number: [id number] ?Date of Birth/Sex: 1966/03/07 (56 y.o. F) ?Treating RN: Donnamarie Poag ?Primary Care Alenna Russell: Alma Friendly Other Clinician: ?Referring Lyn Deemer: [name] ?Treating Shandora Koogler/Extender: Jeri Cos ?Weeks in Treatment: 0 ?Allergies ?Active Allergies ?penicillin ?Severity: Moderate ?amoxicillin ?Severity: Moderate ?vancomycin ?Severity: Moderate ?Sulfa (Sulfonamide Antibiotics) ?Severity: Moderate ?levofloxacin ?Severity: Moderate ?bee sting kit ?meloxicam ?ACE Inhibitors ?Demerol ?Tylox ?codeine ?Percocet ?Allergy Notes ?Electronic Signature(s) ?Signed: 06/06/2021 12:49:21 PM By: Donnamarie Poag ?Entered ByDonnamarie Poag on 06/06/2021 12:49:21 ?JIMIA, GENTLES P. ([id number]) ?-------------------------------------------------------------------------------- ?Arrival Information Details ?Patient Name: Maria, [name]. ?Date of Service: 06/06/2021 10:00 AM ?Medical Record Number: [id number] ?Patient Account Number: [id number] ?Date of Birth/Sex: November 19, 1965 (56 y.o. F) ?Treating RN: Donnamarie Poag ?Primary Care Kaoru Rezendes: Alma Friendly Other Clinician: ?Referring Edna Rede: [name] ?Treating Iwao Shamblin/Extender: Jeri Cos ?Weeks in Treatment: 0 ?Visit Information ?Patient Arrived: Wheel Chair ?Arrival Time: 10:16 ?Accompanied By: husband ?Transfer Assistance: EasyPivot Patient ?Lift ?Patient Identification Verified: Yes ?Patient Requires Transmission-Based No ?Precautions: ?Patient Has Alerts: Yes ?Patient Alerts: DIABETIC ?Electronic Signature(s) ?Signed: 06/06/2021 4:17:36 PM By: Donnamarie Poag ?Entered ByDonnamarie Poag on 06/06/2021 10:28:05 ?MAYBELLE, DEPAOLI P. ([id number]) ?-------------------------------------------------------------------------------- ?Clinic Level of Care Assessment Details ?Patient Name:  Maria, [name]. ?Date of Service: 06/06/2021 10:00 AM ?Medical Record Number: [id number] ?Patient Account Number: [id number] ?Date of Birth/Sex: Aug 04, 1965 (56 y.o. F) ?Treating RN: Donnamarie Poag ?Primary Care Jesyka Slaght: Alma Friendly Other Clinician: ?Referring Brandice Busser: [name] ?Treating Grant Swager/Extender: Jeri Cos ?Weeks in Treatment: 0 ?Clinic Level of Care Assessment Items ?TOOL 2 Quantity Score ?[]  - Use when only an EandM is performed on the INITIAL visit 0 ?ASSESSMENTS - Nursing Assessment / Reassessment ?X - General Physical Exam (combine w/ comprehensive assessment (listed just below) when performed on new ?1 20 ?pt. evals) ?X- 1 25 ?Comprehensive Assessment (HX, ROS, Risk Assessments, Wounds Hx, etc.) ?ASSESSMENTS - Wound and Skin Assessment / Reassessment ?X - Simple Wound Assessment / Reassessment - one wound 1 5 ?[]  - 0 ?Complex Wound Assessment / Reassessment - multiple wounds ?[]  - 0 ?Dermatologic / Skin Assessment (not related to wound area) ?ASSESSMENTS - Ostomy and/or Continence Assessment and Care ?[]  - Incontinence Assessment and Management 0 ?[]  - 0 ?Ostomy Care Assessment and Management (repouching, etc.) ?PROCESS - Coordination of Care ?X - Simple Patient / Family Education for ongoing care 1 15 ?[]  - 0 ?Complex (extensive) Patient / Family Education for ongoing care ?X- 1 10 ?Staff obtains Consents, Records, Test Results / Process Orders ?[]  - 0 ?Staff telephones [place], Nursing Homes / Clarify orders / etc ?[]  - 0 ?Routine Transfer to another Facility (non-emergent condition) ?[]  - 0 ?Routine Hospital Admission (non-emergent condition) ?X- 1 15 ?New Admissions / Biomedical engineer / Ordering NPWT, Apligraf, etc. ?[]  - 0 ?Emergency Hospital Admission (emergent condition) ?X- 1 10 ?Simple Discharge Coordination ?[]  - 0 ?Complex (extensive) Discharge Coordination ?PROCESS - Special Needs ?[]  - Pediatric / Minor Patient Management 0 ?[]  - 0 ?Isolation Patient Management ?[]  - 0 ?Hearing  / Language / Visual special needs ?[]  - 0 ?Assessment of Community assistance (transportation, D/C planning, etc.) ?[]  - 0 ?Additional assistance / Altered mentation ?[]  - 0 ?Support Surface(s) Assessment (bed, cushion, seat, etc.) ?INTERVENTIONS - Wound Cleansing / Measurement ?X - Wound Imaging (photographs - any number of wounds) 1 5 ?[]  - 0 ?Wound Tracing (instead of photographs) ?X- 1 5 ?Simple Wound  Measurement - one wound ?_0  - 0 ?Complex Wound Measurement - multiple wounds ?Maria, [name] ([id number]) ?X- 1 5 ?Simple Wound Cleansing - one wound ?_1  - 0 ?Complex Wound Cleansing - multiple wounds ?INTERVENTIONS - Wound Dressings ?X - Small Wound Dressing one or multiple wounds 1 10 ?_2  - 0 ?Medium Wound Dressing one or multiple wounds ?_3  - 0 ?Large Wound Dressing one or multiple wounds ?_4  - 0 ?Application of Medications - injection ?INTERVENTIONS - Miscellaneous ?_5  - External ear exam 0 ?_6  - 0 ?Specimen Collection (cultures, biopsies, blood, body fluids, etc.) ?_7  - 0 ?Specimen(s) / Culture(s) sent or taken to Lab for analysis ?_8  - 0 ?Patient Transfer (multiple staff / Civil Service fast streamer / Similar devices) ?_9  - 0 ?Simple Staple / Suture removal (25 or less) ?_10  - 0 ?Complex Staple / Suture removal (26 or more) ?_11  - 0 ?Hypo / Hyperglycemic Management (close monitor of Blood Glucose) ?X- 1 15 ?Ankle / Brachial Index (ABI) - do not check if billed separately ?Has the patient been seen at the hospital within the last three years: Yes ?Total Score: 140 ?Level Of Care: New/Established - Level ?4 ?Electronic Signature(s) ?Signed: 06/06/2021 4:17:36 PM By: Donnamarie Poag ?Entered ByDonnamarie Poag on 06/06/2021 11:15:04 ?SHERRI, MCARTHY P. ([id number]) ?-------------------------------------------------------------------------------- ?Encounter Discharge Information Details ?Patient Name: MAISEN, SCHMIT. ?Date of Service: 06/06/2021 10:00 AM ?Medical Record Number: [id number] ?Patient Account Number: [id number] ?Date of  Birth/Sex: July 10, 1965 (56 y.o. F) ?Treating RN: Donnamarie Poag ?Primary Care Torrey Ballinas: Alma Friendly Other Clinician: ?Referring Odilon Cass: [name] ?Treating Yoshika Vensel/Extender: Jeri Cos ?Weeks in Treatment: 0 ?Encounter Discharge Information Items Post Procedure Vitals ?Discharge Condition: Stable ?Temperature (?F): 98.1 ?Ambulatory Status: Wheelchair ?Pulse (bpm): 79 ?Discharge Destination: Home ?Respiratory Rate (breaths/min): 16 ?Transportation: Private Auto ?Blood Pressure (mmHg): 146/82 ?Accompanied By: Olam Idler ?Schedule Follow-up Appointment: Yes ?Clinical Summary of Care: ?Electronic Signature(s) ?Signed: 06/06/2021 4:17:36 PM By: Donnamarie Poag ?Entered ByDonnamarie Poag on 06/06/2021 11:25:15 ?MAKIA, BOSSI P. ([id number]) ?-------------------------------------------------------------------------------- ?Lower Extremity Assessment Details ?Patient Name: TAURA, LAMARRE. ?Date of Service: 06/06/2021 10:00 AM ?Medical Record Number: [id number] ?Patient Account Number: [id number] ?Date of Birth/Sex: April 01, 1965 (56 y.o. F) ?Treating RN: Donnamarie Poag ?Primary Care Roxine Whittinghill: Alma Friendly Other Clinician: ?Referring Ota Ebersole: [name] ?Treating Korene Dula/Extender: Jeri Cos ?Weeks in Treatment: 0 ?Edema Assessment ?Assessed: [Left: Yes] [Right: No] ?Edema: [Left: Ye] [Right: s] ?Calf ?Left: Right: ?Point of Measurement: 30 cm From Medial Instep 48 cm ?Ankle ?Left: Right: ?Point of Measurement: 8 cm From Medial Instep 28.4 cm ?Knee To Floor ?Left: Right: ?From Medial Instep 38 cm ?Vascular Assessment ?Pulses: ?Dorsalis Pedis ?Palpable: [Left:No] ?Doppler Audible: [Left:Yes] ?Blood Pressure: ?Brachial: [Left:108] ?Ankle: ?[Left:Dorsalis Pedis: 114 1.06] ?Electronic Signature(s) ?Signed: 06/06/2021 4:17:36 PM By: Donnamarie Poag ?Entered ByDonnamarie Poag on 06/06/2021 10:51:48 ?ALDONIA, KEEVEN P. ([id number]) ?-------------------------------------------------------------------------------- ?Multi Wound Chart  Details ?Patient Name: ARZELLA, REHMANN. ?Date of Service: 06/06/2021 10:00 AM ?Medical Record Number: [id number] ?Patient Account Number: [id number] ?Date of Birth/Sex: June 26, 1965 (56 y.o. F) ?Treating RN: Donnamarie Poag ?Primary C

## 2021-06-06 NOTE — Progress Notes (Signed)
[name] ([id number]) ?Visit Report for 06/06/2021 ?Chief Complaint Document Details ?Patient Name: [name], [name]. ?Date of Service: 06/06/2021 10:00 AM ?Medical Record Number: [id number] ?Patient Account Number: [id number] ?Date of Birth/Sex: 1965-03-30 (56 y.o. F) ?Treating RN: Donnamarie Poag ?Primary Care Provider: Alma Friendly Other Clinician: ?Referring Provider: [name] ?Treating Provider/Extender: Jeri Cos ?Weeks in Treatment: 0 ?Information Obtained from: Patient ?Chief Complaint ?Left 2nd toe ulcer ?Electronic Signature(s) ?Signed: 06/06/2021 11:03:49 AM By: Worthy Keeler PA-C ?Entered By: Worthy Keeler on 06/06/2021 11:03:49 ?BEATRIC, FULOP P. ([id number]) ?-------------------------------------------------------------------------------- ?Debridement Details ?Patient Name: [name], [name] ?Date of Service: 06/06/2021 10:00 AM ?Medical Record Number: [id number] ?Patient Account Number: [id number] ?Date of Birth/Sex: 1965/06/13 (56 y.o. F) ?Treating RN: Donnamarie Poag ?Primary Care Provider: Alma Friendly Other Clinician: ?Referring Provider: [name] ?Treating Provider/Extender: Jeri Cos ?Weeks in Treatment: 0 ?Debridement Performed for ?Wound #1 Left,Dorsal Toe Second ?Assessment: ?Performed By: Physician Tommie Sams., PA-C ?Debridement Type: Chemical/Enzymatic/Mechanical ?Agent Used: Santyl ?Severity of Tissue Pre Debridement: Limited to breakdown of skin ?Level of Consciousness (Pre- ?Awake and Alert ?procedure): ?Pre-procedure Verification/Time Out ?Yes - 11:10 ?Taken: ?Start Time: 11:11 ?Pain Control: Lidocaine ?Instrument: ?Other : santyl/saline gauze ?Bleeding: None ?Response to Treatment: Procedure was tolerated well ?Level of Consciousness (Post- ?Awake and Alert ?procedure): ?Post Debridement Measurements of Total Wound ?Length: (cm) 0.2 ?Stage: Category/Stage III ?Width: (cm) 0.2 ?Depth: (cm) 0.1 ?Volume: (cm?) 0.003 ?Character of Wound/Ulcer Post Debridement:  Improved ?Severity of Tissue Post Debridement: Limited to breakdown of skin ?Post Procedure Diagnosis ?Same as Pre-procedure ?Electronic Signature(s) ?Signed: 06/06/2021 4:17:36 PM By: Donnamarie Poag ?Signed: 06/06/2021 4:18:52 PM By: Worthy Keeler PA-C ?Entered ByDonnamarie Poag on 06/06/2021 11:11:12 ?ESTIE, SPROULE ([id number]) ?-------------------------------------------------------------------------------- ?HPI Details ?Patient Name: [name], [name] ?Date of Service: 06/06/2021 10:00 AM ?Medical Record Number: [id number] ?Patient Account Number: [id number] ?Date of Birth/Sex: 1966/02/10 (56 y.o. F) ?Treating RN: Donnamarie Poag ?Primary Care Provider: Alma Friendly Other Clinician: ?Referring Provider: [name] ?Treating Provider/Extender: Jeri Cos ?Weeks in Treatment: 0 ?History of Present Illness ?HPI Description: 06/06/2021 upon evaluation patient appears to be doing pretty well all things considered with regard to the wound on her left ?second toe. This is on the proximal interphalangeal joint. Subsequently she tells me that this is something that she noted for somewhere around ?mid January as best she can remember. Currently she has been using mupirocin ointment and at times soaking this with Epsom salt but overall ?has not had any true treatment of the area. She does note having a hemoglobin A1c of 5.25 February 2021 although this is in the [facility] charting ?I could not find that directly but that was what she reported back to [place]. Overall I am very pleased with where things stand though I think she ?could potentially benefit from Santyl to try to clean up the last little bit of necrotic tissue of the central portion of the wound to allow this to heal ?more effectively. ?Patient does have a history of hypertension, diabetes mellitus type 2, and again this appears to have been a pressure injury initially although right ?now she is just using what she called yoga socks with a grip on the bottom which I think is  perfect. ?Electronic Signature(s) ?Signed: 06/06/2021 1:15:11 PM By: Worthy Keeler PA-C ?Entered By: Worthy Keeler on 06/06/2021 13:15:11 ?TYRONZA, HAPPE P. ([id number]) ?-------------------------------------------------------------------------------- ?Physical Exam Details ?Patient Name: [name]. ?Date of Service: 06/06/2021 10:00 AM ?Medical Record Number: [id number] ?Patient Account Number: [id number] ?Date of  Birth/Sex: 1965-08-25 (56 y.o. F) ?Treating RN: Donnamarie Poag ?Primary Care Provider: Alma Friendly Other Clinician: ?Referring Provider: [name] ?Treating Provider/Extender: Jeri Cos ?Weeks in Treatment: 0 ?Constitutional ?patient is hypertensive.. pulse regular and within target range for patient.[name] respirations regular, non-labored and within target range for patient.. ?temperature within target range for patient.. Well-nourished and well-hydrated in no acute distress. ?Eyes ?conjunctiva clear no eyelid edema noted. pupils equal round and reactive to light and accommodation. ?Ears, Nose, Mouth, and Throat ?no gross abnormality of ear auricles or external auditory canals. normal hearing noted during conversation. mucus membranes moist. ?Respiratory ?normal breathing without difficulty. ?Cardiovascular ?2+ dorsalis pedis/posterior tibialis pulses. no clubbing, cyanosis, significant edema, <3 sec cap refill. ?Musculoskeletal ?Patient unable to walk without assistance. no significant deformity or arthritic changes, no loss or range of motion, no clubbing. ?Psychiatric ?this patient is able to make decisions and demonstrates good insight into disease process. Alert and Oriented x 3. pleasant and cooperative. ?Notes ?Upon inspection patient's wound bed actually showed signs of good granulation and epithelization for the most part there was a little bit of slough ?noted in the base of the wound but fortunately nothing too significant at this time. Overall I am actually extremely pleased with where  we stand. ?Electronic Signature(s) ?Signed: 06/06/2021 1:16:08 PM By: Worthy Keeler PA-C ?Entered By: Worthy Keeler on 06/06/2021 13:16:08 ?DANEE, SOLLER P. ([id number]) ?-------------------------------------------------------------------------------- ?Physician Orders Details ?Patient Name: AINSLEIGH, KAKOS. ?Date of Service: 06/06/2021 10:00 AM ?Medical Record Number: [id number] ?Patient Account Number: [id number] ?Date of Birth/Sex: Mar 02, 1966 (55 y.o. F) ?Treating RN: Donnamarie Poag ?Primary Care Provider: Alma Friendly Other Clinician: ?Referring Provider: [name] ?Treating Provider/Extender: Jeri Cos ?Weeks in Treatment: 0 ?Verbal / Phone Orders: No ?Diagnosis Coding ?ICD-10 Coding ?Code Description ?Q91.694 Pressure ulcer of other site, stage 3 ?E11.621 Type 2 diabetes mellitus with foot ulcer ?I10 Essential (primary) hypertension ?Follow-up Appointments ?o Return Appointment in 1 week. ?Bathing/ Shower/ Hygiene ?o Wash wounds with antibacterial soap and water. - change dressing after shower ?o No tub bath. ?Anesthetic (Use 'Patient Medications' Section for Anesthetic Order Entry) ?o Lidocaine applied to wound bed ?Edema Control - Lymphedema / Segmental Compressive Device / Other ?o Elevate legs to the level of the heart and pump ankles as often as possible ?o DO YOUR BEST to sleep in the bed at night. DO NOT sleep in your recliner. Long hours of sitting in a recliner leads to ?swelling of the legs and/or potential wounds on your backside. ?Additional Orders / Instructions ?o Follow Nutritious Diet and Increase Protein Intake ?Wound Treatment ?Wound #1 - Toe Second Wound Laterality: Dorsal, Left ?Cleanser: Soap and Water 1 x Per Day/30 Days ?Discharge Instructions: Gently cleanse wound with antibacterial soap, rinse and pat dry prior to dressing wounds ?Cleanser: Wound Cleanser 1 x Per Day/30 Days ?Discharge Instructions: Wash your hands with soap and water. Remove old dressing, discard  into plastic bag and place into trash. ?Cleanse the wound with Wound Cleanser prior to applying a clean dressing using gauze sponges, not tissues or cotton balls. Do not ?scrub or use excessive force. Pat dry using

## 2021-06-08 DIAGNOSIS — Z7984 Long term (current) use of oral hypoglycemic drugs: Secondary | ICD-10-CM | POA: Diagnosis not present

## 2021-06-08 DIAGNOSIS — Z794 Long term (current) use of insulin: Secondary | ICD-10-CM | POA: Diagnosis not present

## 2021-06-08 DIAGNOSIS — Z7985 Long-term (current) use of injectable non-insulin antidiabetic drugs: Secondary | ICD-10-CM | POA: Diagnosis not present

## 2021-06-08 DIAGNOSIS — E1140 Type 2 diabetes mellitus with diabetic neuropathy, unspecified: Secondary | ICD-10-CM | POA: Diagnosis not present

## 2021-06-08 DIAGNOSIS — E1165 Type 2 diabetes mellitus with hyperglycemia: Secondary | ICD-10-CM | POA: Diagnosis not present

## 2021-06-12 DIAGNOSIS — G1221 Amyotrophic lateral sclerosis: Secondary | ICD-10-CM | POA: Diagnosis not present

## 2021-06-14 ENCOUNTER — Encounter: Payer: Self-pay | Admitting: Primary Care

## 2021-06-14 ENCOUNTER — Ambulatory Visit: Payer: Medicare PPO | Admitting: Primary Care

## 2021-06-14 ENCOUNTER — Encounter: Payer: Medicare PPO | Admitting: Physician Assistant

## 2021-06-14 ENCOUNTER — Other Ambulatory Visit: Payer: Self-pay

## 2021-06-14 ENCOUNTER — Other Ambulatory Visit: Payer: Self-pay | Admitting: Primary Care

## 2021-06-14 VITALS — BP 126/78 | HR 76 | Temp 97.6°F | Ht 66.0 in | Wt 301.0 lb

## 2021-06-14 DIAGNOSIS — I10 Essential (primary) hypertension: Secondary | ICD-10-CM

## 2021-06-14 DIAGNOSIS — E1140 Type 2 diabetes mellitus with diabetic neuropathy, unspecified: Secondary | ICD-10-CM

## 2021-06-14 DIAGNOSIS — J45909 Unspecified asthma, uncomplicated: Secondary | ICD-10-CM | POA: Diagnosis not present

## 2021-06-14 DIAGNOSIS — E1151 Type 2 diabetes mellitus with diabetic peripheral angiopathy without gangrene: Secondary | ICD-10-CM | POA: Diagnosis not present

## 2021-06-14 DIAGNOSIS — Z794 Long term (current) use of insulin: Secondary | ICD-10-CM | POA: Diagnosis not present

## 2021-06-14 DIAGNOSIS — E11621 Type 2 diabetes mellitus with foot ulcer: Secondary | ICD-10-CM | POA: Diagnosis not present

## 2021-06-14 DIAGNOSIS — L89893 Pressure ulcer of other site, stage 3: Secondary | ICD-10-CM | POA: Diagnosis not present

## 2021-06-14 DIAGNOSIS — R29898 Other symptoms and signs involving the musculoskeletal system: Secondary | ICD-10-CM

## 2021-06-14 DIAGNOSIS — G4730 Sleep apnea, unspecified: Secondary | ICD-10-CM | POA: Diagnosis not present

## 2021-06-14 DIAGNOSIS — E782 Mixed hyperlipidemia: Secondary | ICD-10-CM | POA: Diagnosis not present

## 2021-06-14 DIAGNOSIS — R300 Dysuria: Secondary | ICD-10-CM | POA: Diagnosis not present

## 2021-06-14 DIAGNOSIS — J4520 Mild intermittent asthma, uncomplicated: Secondary | ICD-10-CM | POA: Diagnosis not present

## 2021-06-14 DIAGNOSIS — F419 Anxiety disorder, unspecified: Secondary | ICD-10-CM | POA: Diagnosis not present

## 2021-06-14 DIAGNOSIS — I890 Lymphedema, not elsewhere classified: Secondary | ICD-10-CM | POA: Diagnosis not present

## 2021-06-14 DIAGNOSIS — M1990 Unspecified osteoarthritis, unspecified site: Secondary | ICD-10-CM | POA: Diagnosis not present

## 2021-06-14 LAB — POC URINALSYSI DIPSTICK (AUTOMATED)
Bilirubin, UA: NEGATIVE
Blood, UA: NEGATIVE
Glucose, UA: POSITIVE — AB
Ketones, UA: NEGATIVE
Leukocytes, UA: NEGATIVE
Nitrite, UA: NEGATIVE
Protein, UA: NEGATIVE
Spec Grav, UA: 1.025 (ref 1.010–1.025)
Urobilinogen, UA: 0.2 E.U./dL
pH, UA: 5.5 (ref 5.0–8.0)

## 2021-06-14 LAB — MICROALBUMIN / CREATININE URINE RATIO
Creatinine,U: 96.5 mg/dL
Microalb Creat Ratio: 0.7 mg/g (ref 0.0–30.0)
Microalb, Ur: <0.7 mg/dL (ref 0.0–1.9)

## 2021-06-14 MED ORDER — GABAPENTIN 600 MG PO TABS: 600.0000 mg | ORAL_TABLET | Freq: Two times a day (BID) | ORAL | 3 refills | Status: DC

## 2021-06-14 NOTE — Assessment & Plan Note (Addendum)
Chronic and ongoing, no changes. ?Following with neurology through [facility]. ? ?Continue cyclobenzaprine 10 mg 3 times daily as needed muscle spasms. ?

## 2021-06-14 NOTE — Progress Notes (Signed)
? ?Subjective:  ? ? Patient ID: [name], female    DOB: 1966-01-17, 56 y.o.   MRN: [id number] ? ?HPI ? ?[name] is a very pleasant 56 y.o. female with a history of asthma, GERD, type 2 diabetes, hyperlipidemia, anxiety, chronic lower extremity weakness, migraines who presents today for follow-up of chronic conditions and to discuss urinary pressure. Her husband joins [place] today. ? ?1) Type 2 Diabetes: Following with endocrinology and is managed on Jardiance 25 mg daily, Tresiba 35 BID, NovoLog 18-24 TID with meals, Trulicity 3 mg weekly. Most recent A1C of 6.5 last week. She is needing for [place] to collect a urine microalbumin. ? ?She has noticed some urinary pressure and burning for the last week. Has been holding her urine more recently. She denies foul smell, urinary frequency.  ? ?2) Essential Hypertension/Hyperlipidemia: Currently managed on hydrochlorothiazide 25 mg daily, losartan 50 mg daily, metoprolol tartrate 25 mg twice daily, atorvastatin 40 mg daily. ? ?She denies chest pain, shortness of breath, headaches.  ? ?BP Readings from Last 3 Encounters:  ?06/14/21 126/78  ?05/24/21 128/76  ?05/17/21 138/86  ? ?3) Chronic Lower Extremity Weakness/Chronic Back Pain: Mostly wheelchair bound, uses walker in her home.  Currently managed on cyclobenzaprine 10 mg for which she takes 3 times daily for muscle spasms, gabapentin 600 mg twice daily for neuropathy and pain.  ? ?Following with neurology through [facility], was told during her last visit that her lapband surgery may have caused nerve damage causing her lower extremity weakness.  ? ?4) Asthma: Currently managed on albuterol inhaler and nebulized treatment. Asthma symptoms are seasonal, no concerns today. Infrequent use of treatments and inhaler.  ? ?5) Anxiety/Depression: Currently managed on fluoxetine 40 mg daily. Feels well managed on this regimen, no concerns today. ? ?6) Colitis: Currently following with GI, recently underwent colonoscopy.  Managed on budesonide 3 mg capsules. Symptoms are improving. ? ? ? ?Review of Systems  ?Respiratory:  Negative for shortness of breath and wheezing.   ?Cardiovascular:  Negative for chest pain.  ?Gastrointestinal:  Negative for constipation.  ?Musculoskeletal:  Positive for arthralgias.  ?Neurological:  Negative for headaches.  ?Psychiatric/Behavioral:  The patient is not nervous/anxious.   ? ?   ? ? ?Past Medical History:  ?Diagnosis Date  ? Allergy   ? Amyotrophic lateral sclerosis (ALS) ([place]) 09/23/2018  ? Anemia   ? Anxiety   ? Arthritis   ? Asthma   ? Colon polyp   ? Complication of anesthesia   ? Diabetes mellitus   ? Displaced fracture of proximal end of right fibula 07/21/2017  ? Edema   ? Fatigue   ? Hypertension   ? IBS (irritable bowel syndrome)   ? Lump in female breast   ? Methicillin resistant Staphylococcus aureus in conditions classified elsewhere and of unspecified site   ? Migraines   ? Morbid obesity ([place])   ? Night sweats   ? Nonspecific abnormal results of thyroid function study   ? Other B-complex deficiencies   ? Paresthesias   ? PONV (postoperative nausea and vomiting)   ? Pure hypercholesterolemia   ? Reflux   ? Sacral fracture ([place])   ? Sinus problem   ? Symptomatic states associated with artificial menopause   ? Thyroid disease   ? Type II or unspecified type diabetes mellitus without mention of complication, not stated as uncontrolled   ? Unspecified sleep apnea   ? ? ?Social History  ? ?Socioeconomic History  ?  Marital status: Married  ?  Spouse name: Not on file  ? Number of children: 3  ? Years of education: college  ? Highest education level: Not on file  ?Occupational History  ? Occupation: Product manager  ?  Employer: [employer]  ?Tobacco Use  ? Smoking status: Never  ? Smokeless tobacco: Never  ?Vaping Use  ? Vaping Use: Never used  ?Substance and Sexual Activity  ? Alcohol use: Yes  ?  Alcohol/week: 0.0 standard drinks  ?  Comment: rarely - once per week at most-nothing for the  past year 01/31/18  ? Drug use: No  ? Sexual activity: Never  ?  Birth control/protection: None  ?Other Topics Concern  ? Not on file  ?Social History Narrative  ? Exercise: walking two days per week, 30 minutes.   ?    Married x 29 years, happily married; no abuse.  ?     Children: 3 children; 1 grandchild; two step grandchildren; 1 gg.  ?    Lives: with husband, mother-in-law.  ?    Employment:  [employer] x 1996; Product manager.  [place] work.  ?    Tobacco: never  ?    Alcohol: socially.  One glass of wine per month.  ?    Exercise:  Walking around campus.    ?    Seatbelt: 100%  ?    Guns: gun safe in garage.  ? ?Social Determinants of Health  ? ?Financial Resource Strain: Not on file  ?Food Insecurity: Not on file  ?Transportation Needs: Not on file  ?Physical Activity: Not on file  ?Stress: Not on file  ?Social Connections: Not on file  ?Intimate Partner Violence: Not on file  ? ? ?Past Surgical History:  ?Procedure Laterality Date  ? abcess removal  1997  ? MRSA  ? ABDOMINAL HYSTERECTOMY  1997  ? complete in 1997, partial was in 1995  ? carpal tunnel release r  07/03/13  ? [place]  ? [place]  ? CHOLECYSTECTOMY  03/1989  ? COLONOSCOPY  03/21/2011  ? Normal.  [facility].  ? KNEE SURGERY  2006  ? LAPAROSCOPIC GASTRIC BANDING  05/2009  ? TONSILLECTOMY AND ADENOIDECTOMY  1974  ? TUBAL LIGATION    ? ULNAR TUNNEL RELEASE Right 02/05/2018  ? Procedure: CUBITAL TUNNEL RELEASE/DECOMPRESSION;  Surgeon: Melrose Nakayama, MD;  Location: [place];  Service: Orthopedics;  Laterality: Right;  ? ? ?Family History  ?Problem Relation Age of Onset  ? Diabetes Mother   ? Heart disease Mother 67  ?     CHF, CAD  ? Other Mother   ?     muscle disease  ? Hypertension Mother   ? Hyperlipidemia Mother   ? Arthritis Mother   ?     OA hip L s/p THR  ? Leukemia Mother 73  ? Diabetes Father   ? Heart disease Father   ?     AMI/CABG/valve replacement age 28.  ? Stroke Father   ? Diabetes Brother   ? Heart disease Brother   ?      stent age 60.  ? Fibromyalgia Daughter   ? Crohn's disease Daughter   ? Hypertension Son   ? Alzheimer's disease Maternal Grandmother   ? Emphysema Maternal Grandfather   ? Heart disease Paternal Grandmother   ? Breast cancer Other   ?     2 Aunts  ? ? ?Allergies  ?Allergen Reactions  ? Amoxicillin Shortness Of  Breath and Rash  ? Bee Venom Anaphylaxis  ? Levofloxacin Shortness Of Breath and Rash  ? Meloxicam Hives and Rash  ? Vancomycin Anaphylaxis  ? Sulfa Antibiotics Rash  ? Ace Inhibitors Cough  ? Codeine   ? Meperidine Hcl   ? Oxycodone-Acetaminophen   ? Tyloxapol   ?  Other reaction(s): vomiting/rash  ? Penicillins Swelling and Rash  ?  Has patient had a PCN reaction causing immediate rash, facial/tongue/throat swelling, SOB or lightheadedness with hypotension: Yes ?Has patient had a PCN reaction causing severe rash involving mucus membranes or skin necrosis: Yes ?Has patient had a PCN reaction that required hospitalization: No ?Has patient had a PCN reaction occurring within the last 10 years: No ?If all of the above answers are "NO", then may proceed with Cephalosporin use. ?  ? Tequin [Gatifloxacin] Rash  ? ? ?Current Outpatient Medications on File Prior to Visit  ?Medication Sig Dispense Refill  ? acetaminophen (TYLENOL) 650 MG CR tablet Take 650 mg by mouth at bedtime.    ? albuterol (PROVENTIL) (2.5 MG/3ML) 0.083% nebulizer solution Take 3 mLs (2.5 mg total) by nebulization every 6 (six) hours as needed for wheezing or shortness of breath. 75 mL 12  ? albuterol (VENTOLIN HFA) 108 (90 Base) MCG/ACT inhaler Inhale 1-2 puffs into the lungs every 6 (six) hours as needed for wheezing or shortness of breath. 18 g 0  ? atorvastatin (LIPITOR) 40 MG tablet Take 1 tablet (40 mg total) by mouth daily. For cholesterol. Due in March for office visit. 90 tablet 0  ? budesonide (ENTOCORT EC) 3 MG 24 hr capsule Take by mouth.    ? colestipol (COLESTID) 1 g tablet Take by mouth.    ? Continuous Blood Gluc Receiver  (DEXCOM G6 RECEIVER) DEVI Use as directed for continuous glucose monitoring.    ? Continuous Blood Gluc Transmit (DEXCOM G6 TRANSMITTER) MISC Use as directed for continuous glucose monitoring. Reuse transmit

## 2021-06-14 NOTE — Assessment & Plan Note (Signed)
Controlled. ? ?Continue losartan 50 mg daily, hydrochlorothiazide 25 mg daily, metoprolol tartrate 25 mg twice daily. ? ?BMP reviewed from endocrinology through care everywhere. ?

## 2021-06-14 NOTE — Patient Instructions (Signed)
Stop by the lab prior to leaving today. I will notify you of your results once received.  ? ?We changed your gabapentin to the 600 mg dose.  Take 1 tablet by mouth twice daily. ? ?It was a pleasure to see you today! ? ? ?

## 2021-06-14 NOTE — Assessment & Plan Note (Signed)
Controlled.  ?Continue fluoxetine 40 mg daily. ?

## 2021-06-14 NOTE — Assessment & Plan Note (Signed)
Controlled. ?A1c reviewed from care everywhere. ?Urine microalbumin due, pending today. ? ?Continue following with endocrinology. ? ?Continue Tresiba 35 units twice daily, NovoLog 18 to 24 units 3 times daily with meals, Trulicity 3 mg weekly, Jardiance 25 mg daily. ?

## 2021-06-14 NOTE — Assessment & Plan Note (Signed)
Continue atorvastatin 40 mg daily. Repeat lipid panel pending. 

## 2021-06-14 NOTE — Assessment & Plan Note (Signed)
UA today clear, 3+ glucose which is secondary to her Jardiance. ? ?Encouraged water consumption, more frequent bladder emptying.  If her symptoms persist we will collect another urine sample.  She will update. ?

## 2021-06-14 NOTE — Progress Notes (Addendum)
ELLENIE, SALOME ([id number]) ?Visit Report for 06/14/2021 ?Arrival Information Details ?Patient Name: [name], [name]. ?Date of Service: 06/14/2021 3:15 PM ?Medical Record Number: [id number] ?Patient Account Number: [id number] ?Date of Birth/Sex: [birth year] (56 y.o. F) ?Treating RN: Donnamarie Poag ?Primary Care Gini Caputo: Alma Friendly Other Clinician: ?Referring Shere Eisenhart: Alma Friendly ?Treating Jaleya Pebley/Extender: Jeri Cos ?Weeks in Treatment: 1 ?Visit Information History Since Last Visit ?Added or deleted any medications: No ?Patient Arrived: Wheel Chair ?Had a fall or experienced change in No ?Arrival Time: 15:45 ?activities of daily living that may affect ?Accompanied By: husband ?risk of falls: ?Transfer Assistance: EasyPivot Patient ?Hospitalized since last visit: No ?Lift ?Has Dressing in Place as Prescribed: Yes ?Patient Identification Verified: Yes ?Pain Present Now: No ?Secondary Verification Process Completed: Yes ?Patient Requires Transmission-Based No ?Precautions: ?Patient Has Alerts: Yes ?Patient Alerts: DIABETIC ?Electronic Signature(s) ?Signed: 06/14/2021 4:13:04 PM By: Donnamarie Poag ?Entered ByDonnamarie Poag on 06/14/2021 15:46:49 ?DONYAE, KOHN P. ([id number]) ?-------------------------------------------------------------------------------- ?Clinic Level of Care Assessment Details ?Patient Name: JUNIOR, KENEDY. ?Date of Service: 06/14/2021 3:15 PM ?Medical Record Number: [id number] ?Patient Account Number: [id number] ?Date of Birth/Sex: 06-Mar-1966 (56 y.o. F) ?Treating RN: Donnamarie Poag ?Primary Care Annalaura Sauseda: Alma Friendly Other Clinician: ?Referring Zoua Caporaso: Alma Friendly ?Treating Delorese Sellin/Extender: Jeri Cos ?Weeks in Treatment: 1 ?Clinic Level of Care Assessment Items ?TOOL 4 Quantity Score ?'[]'$  - Use when only an EandM is performed on FOLLOW-UP visit 0 ?ASSESSMENTS - Nursing Assessment / Reassessment ?'[]'$  - Reassessment of Co-morbidities (includes updates in patient status) 0 ?'[]'$  -  0 ?Reassessment of Adherence to Treatment Plan ?ASSESSMENTS - Wound and Skin Assessment / Reassessment ?X - Simple Wound Assessment / Reassessment - one wound 1 5 ?'[]'$  - 0 ?Complex Wound Assessment / Reassessment - multiple wounds ?'[]'$  - 0 ?Dermatologic / Skin Assessment (not related to wound area) ?ASSESSMENTS - Focused Assessment ?'[]'$  - Circumferential Edema Measurements - multi extremities 0 ?'[]'$  - 0 ?Nutritional Assessment / Counseling / Intervention ?'[]'$  - 0 ?Lower Extremity Assessment (monofilament, tuning fork, pulses) ?'[]'$  - 0 ?Peripheral Arterial Disease Assessment (using hand held doppler) ?ASSESSMENTS - Ostomy and/or Continence Assessment and Care ?'[]'$  - Incontinence Assessment and Management 0 ?'[]'$  - 0 ?Ostomy Care Assessment and Management (repouching, etc.) ?PROCESS - Coordination of Care ?X - Simple Patient / Family Education for ongoing care 1 15 ?'[]'$  - 0 ?Complex (extensive) Patient / Family Education for ongoing care ?'[]'$  - 0 ?Staff obtains Consents, Records, Test Results / Process Orders ?'[]'$  - 0 ?Staff telephones HHA, Nursing Homes / Clarify orders / etc ?'[]'$  - 0 ?Routine Transfer to another Facility (non-emergent condition) ?'[]'$  - 0 ?Routine Hospital Admission (non-emergent condition) ?'[]'$  - 0 ?New Admissions / Biomedical engineer / Ordering NPWT, Apligraf, etc. ?'[]'$  - 0 ?Emergency Hospital Admission (emergent condition) ?X- 1 10 ?Simple Discharge Coordination ?'[]'$  - 0 ?Complex (extensive) Discharge Coordination ?PROCESS - Special Needs ?'[]'$  - Pediatric / Minor Patient Management 0 ?'[]'$  - 0 ?Isolation Patient Management ?'[]'$  - 0 ?Hearing / Language / Visual special needs ?'[]'$  - 0 ?Assessment of Community assistance (transportation, D/C planning, etc.) ?'[]'$  - 0 ?Additional assistance / Altered mentation ?'[]'$  - 0 ?Support Surface(s) Assessment (bed, cushion, seat, etc.) ?INTERVENTIONS - Wound Cleansing / Measurement ?ELDORIS, BEISER ([id number]) ?X- 1 5 ?Simple Wound Cleansing - one wound ?'[]'$  - 0 ?Complex Wound  Cleansing - multiple wounds ?X- 1 5 ?Wound Imaging (photographs - any number of wounds) ?'[]'$  - 0 ?Wound Tracing (instead of photographs) ?X- 1 5 ?Simple Wound Measurement - one wound ?'[]'$  -  0 ?Complex Wound Measurement - multiple wounds ?INTERVENTIONS - Wound Dressings ?X - Small Wound Dressing one or multiple wounds 1 10 ?'[]'$  - 0 ?Medium Wound Dressing one or multiple wounds ?'[]'$  - 0 ?Large Wound Dressing one or multiple wounds ?X- 1 5 ?Application of Medications - topical ?'[]'$  - 0 ?Application of Medications - injection ?INTERVENTIONS - Miscellaneous ?'[]'$  - External ear exam 0 ?'[]'$  - 0 ?Specimen Collection (cultures, biopsies, blood, body fluids, etc.) ?'[]'$  - 0 ?Specimen(s) / Culture(s) sent or taken to Lab for analysis ?'[]'$  - 0 ?Patient Transfer (multiple staff / Civil Service fast streamer / Similar devices) ?'[]'$  - 0 ?Simple Staple / Suture removal (25 or less) ?'[]'$  - 0 ?Complex Staple / Suture removal (26 or more) ?'[]'$  - 0 ?Hypo / Hyperglycemic Management (close monitor of Blood Glucose) ?'[]'$  - 0 ?Ankle / Brachial Index (ABI) - do not check if billed separately ?X- 1 5 ?Vital Signs ?Has the patient been seen at the hospital within the last three years: Yes ?Total Score: 65 ?Level Of Care: New/Established - Level ?2 ?Electronic Signature(s) ?Signed: 06/14/2021 4:13:04 PM By: Donnamarie Poag ?Entered ByDonnamarie Poag on 06/14/2021 16:09:18 ?JANNELL, FRANTA P. ([id number]) ?-------------------------------------------------------------------------------- ?Encounter Discharge Information Details ?Patient Name: TEMPERENCE, ZENOR. ?Date of Service: 06/14/2021 3:15 PM ?Medical Record Number: [id number] ?Patient Account Number: [id number] ?Date of Birth/Sex: 02-07-1966 (56 y.o. F) ?Treating RN: Donnamarie Poag ?Primary Care Kevante Lunt: Alma Friendly Other Clinician: ?Referring Fatma Rutten: Alma Friendly ?Treating Jona Zappone/Extender: Jeri Cos ?Weeks in Treatment: 1 ?Encounter Discharge Information Items ?Discharge Condition: Stable ?Ambulatory Status:  Wheelchair ?Discharge Destination: Home ?Transportation: Private Auto ?Accompanied By: husband ?Schedule Follow-up Appointment: No ?Clinical Summary of Care: ?Electronic Signature(s) ?Signed: 06/14/2021 4:13:04 PM By: Donnamarie Poag ?Entered ByDonnamarie Poag on 06/14/2021 16:10:50 ?SHAE, HINNENKAMP P. ([id number]) ?-------------------------------------------------------------------------------- ?Lower Extremity Assessment Details ?Patient Name: LAMICA, MCCART. ?Date of Service: 06/14/2021 3:15 PM ?Medical Record Number: [id number] ?Patient Account Number: [id number] ?Date of Birth/Sex: Dec 10, 1965 (56 y.o. F) ?Treating RN: Donnamarie Poag ?Primary Care Tabytha Gradillas: Alma Friendly Other Clinician: ?Referring Spence Soberano: Alma Friendly ?Treating Jamill Wetmore/Extender: Jeri Cos ?Weeks in Treatment: 1 ?Edema Assessment ?Assessed: [Left: Yes] [Right: No] ?Edema: [Left: N] [Right: o] ?Vascular Assessment ?Pulses: ?Dorsalis Pedis ?Palpable: [Left:Yes] ?Electronic Signature(s) ?Signed: 06/14/2021 4:13:04 PM By: Donnamarie Poag ?Entered ByDonnamarie Poag on 06/14/2021 15:57:41 ?VINCENT, EHRLER P. ([id number]) ?-------------------------------------------------------------------------------- ?Multi Wound Chart Details ?Patient Name: YOLANDE, SKODA. ?Date of Service: 06/14/2021 3:15 PM ?Medical Record Number: [id number] ?Patient Account Number: [id number] ?Date of Birth/Sex: 1965/03/23 (56 y.o. F) ?Treating RN: Donnamarie Poag ?Primary Care Chrisie Jankovich: Alma Friendly Other Clinician: ?Referring Elishah Ashmore: Alma Friendly ?Treating Tekoa Amon/Extender: Jeri Cos ?Weeks in Treatment: 1 ?Vital Signs ?Height(in): 67 ?Pulse(bpm): 97 ?Weight(lbs): 301 ?Blood Pressure(mmHg): 169/83 ?Body Mass Index(BMI): 47.1 ?Temperature(??F): 97.6 ?Respiratory Rate(breaths/min): 16 ?Photos: [N/A:N/A] ?Wound Location: Left, Dorsal Toe Second N/A N/A ?Wounding Event: Gradually Appeared N/A N/A ?Primary Etiology: Pressure Ulcer N/A N/A ?Secondary Etiology: Diabetic Wound/Ulcer of the  Lower N/A N/A ?Extremity ?Comorbid History: Lymphedema, Asthma, Sleep N/A N/A ?Apnea, Hypertension, Colitis, Type ?II Diabetes, Osteoarthritis, ?Neuropathy ?Date Acquired: 04/03/2021 N/A N/A ?Weeks of Treatment: 1 N/A N/A ?[place]ou

## 2021-06-14 NOTE — Assessment & Plan Note (Signed)
Controlled. ?Infrequent use of nebulized treatments and inhaler. ? ?Continue albuterol inhaler as needed, continue nebulized albuterol treatments as needed. ?

## 2021-06-14 NOTE — Addendum Note (Signed)
Addended by: Pilar Grammes on: 06/14/2021 10:06 AM ? ? Modules accepted: Orders ? ?

## 2021-06-14 NOTE — Progress Notes (Addendum)
[name], [name] ([id number]) ?Visit Report for 06/14/2021 ?Chief Complaint Document Details ?Patient Name: [name], [name]. ?Date of Service: 06/14/2021 3:15 PM ?Medical Record Number: [id number] ?Patient Account Number: [id number] ?Date of Birth/Sex: [birth year] (56 y.o. F) ?Treating RN: Donnamarie Poag ?Primary Care Provider: Alma Friendly Other Clinician: ?Referring Provider: Alma Friendly ?Treating Provider/Extender: Jeri Cos ?Weeks in Treatment: 1 ?Information Obtained from: Patient ?Chief Complaint ?Left 2nd toe ulcer ?Electronic Signature(s) ?Signed: 06/14/2021 3:15:59 PM By: Worthy Keeler PA-C ?Entered By: Worthy Keeler on 06/14/2021 15:15:58 ?[name], [name] ([id number]) ?-------------------------------------------------------------------------------- ?HPI Details ?Patient Name: [name], [name]. ?Date of Service: 06/14/2021 3:15 PM ?Medical Record Number: [id number] ?Patient Account Number: [id number] ?Date of Birth/Sex: 08/18/1965 (56 y.o. F) ?Treating RN: Donnamarie Poag ?Primary Care Provider: Alma Friendly Other Clinician: ?Referring Provider: Alma Friendly ?Treating Provider/Extender: Jeri Cos ?Weeks in Treatment: 1 ?History of Present Illness ?HPI Description: 06/06/2021 upon evaluation patient appears to be doing pretty well all things considered with regard to the wound on her left ?second toe. This is on the proximal interphalangeal joint. Subsequently she tells me that this is something that she noted for somewhere around ?mid January as best she can remember. Currently she has been using mupirocin ointment and at times soaking this with Epsom salt but overall ?has not had any true treatment of the area. She does note having a hemoglobin A1c of 5.25 February 2021 although this is [facility] ?I could not find that directly but that was what she reported back to [place]. Overall I am very pleased with where things stand though I think she ?could potentially benefit from Santyl to try to  clean up the last little bit of necrotic tissue of the central portion of the wound to allow this to heal ?more effectively. ?Patient does have a history of hypertension, diabetes mellitus type 2, and again this appears to have been a pressure injury initially although right ?now she is just using what she called yoga socks with a grip on the bottom which I think is perfect. ?06/14/2021 upon evaluation today patient appears to be doing well with regard to her toe and actually very pleased with where things stand and I ?think she is making excellent progress here. ?Electronic Signature(s) ?Signed: 06/14/2021 4:07:57 PM By: Worthy Keeler PA-C ?Previous Signature: 06/14/2021 4:05:14 PM Version By: Worthy Keeler PA-C ?Entered By: Worthy Keeler on 06/14/2021 16:07:57 ?[name], [name] ([id number]) ?-------------------------------------------------------------------------------- ?Physical Exam Details ?Patient Name: [name], [name]. ?Date of Service: 06/14/2021 3:15 PM ?Medical Record Number: [id number] ?Patient Account Number: [id number] ?Date of Birth/Sex: 05/01/1965 (56 y.o. F) ?Treating RN: Donnamarie Poag ?Primary Care Provider: Alma Friendly Other Clinician: ?Referring Provider: Alma Friendly ?Treating Provider/Extender: Jeri Cos ?Weeks in Treatment: 1 ?Constitutional ?Well-nourished and well-hydrated in no acute distress. ?Respiratory ?normal breathing without difficulty. ?Psychiatric ?this patient is able to make decisions and demonstrates good insight into disease process. Alert and Oriented x 3. pleasant and cooperative. ?Notes ?Upon further evaluation patient's wound actually showed signs of complete resolution I do not see any evidence of infection locally nor ?systemically at this time. ?Electronic Signature(s) ?Signed: 06/14/2021 4:08:35 PM By: Worthy Keeler PA-C ?Entered By: Worthy Keeler on 06/14/2021 16:08:35 ?[name], [name]  ([id number]) ?-------------------------------------------------------------------------------- ?Physician Orders Details ?Patient Name: [name], [name]. ?Date of Service: 06/14/2021 3:15 PM ?Medical Record Number: [id number] ?Patient Account Number: [id number] ?Date of Birth/Sex: Aug 10, 1965 (56 y.o. F) ?Treating RN: Donnamarie Poag ?Primary Care Provider: Alma Friendly Other Clinician: ?Referring Provider:  Alma Friendly ?Treating Provider/Extender: Jeri Cos ?Weeks in Treatment: 1 ?Verbal / Phone Orders: No ?Diagnosis Coding ?ICD-10 Coding ?Code Description ?Q94.503 Pressure ulcer of other site, stage 3 ?E11.621 Type 2 diabetes mellitus with foot ulcer ?I10 Essential (primary) hypertension ?Discharge From [facility] ?o Discharge from [place] [facility] Complete ?Non-Wound Condition ?o Additional non-wound orders/instructions: - Recommend cover newly healed skin for a week ; bandaid ?Electronic Signature(s) ?Signed: 06/14/2021 4:13:04 PM By: Donnamarie Poag ?Signed: 06/14/2021 4:21:45 PM By: Worthy Keeler PA-C ?Entered ByDonnamarie Poag on 06/14/2021 16:04:21 ?[name], [name] ([id number]) ?-------------------------------------------------------------------------------- ?Problem List Details ?Patient Name: [name], [name]. ?Date of Service: 06/14/2021 3:15 PM ?Medical Record Number: [id number] ?Patient Account Number: [id number] ?Date of Birth/Sex: 01/16/1966 (56 y.o. F) ?Treating RN: Donnamarie Poag ?Primary Care Provider: Alma Friendly Other Clinician: ?Referring Provider: Alma Friendly ?Treating Provider/Extender: Jeri Cos ?Weeks in Treatment: 1 ?Active Problems ?ICD-10 ?Encounter ?Code Description Active Date MDM ?Diagnosis ?P79.480 Pressure ulcer of other site, stage 3 06/06/2021 No Yes ?E11.621 Type 2 diabetes mellitus with foot ulcer 06/06/2021 No Yes ?I10 Essential (primary) hypertension 06/06/2021 No Yes ?Inactive Problems ?Resolved Problems ?Electronic Signature(s) ?Signed: 06/14/2021 3:15:53 PM By:  Worthy Keeler PA-C ?Entered By: Worthy Keeler on 06/14/2021 15:15:53 ?[name], [name] ([id number]) ?-------------------------------------------------------------------------------- ?Progress Note Details ?Patient Name: [name], MCCLAFFERTY. ?Date of Service: 06/14/2021 3:15 PM ?Medical Record Number: [id number] ?Patient Account Number: [id number] ?Date of Birth/Sex: 14-Aug-1965 (56 y.o. F) ?Treating RN: Donnamarie Poag ?Primary Care Provider: Alma Friendly Other Clinician: ?Referring Provider: Alma Friendly ?Treating Provider/Extender: Jeri Cos ?Weeks in Treatment: 1 ?Subjective ?Chief Complaint ?Information obtained from Patient ?Left 2nd toe ulcer ?History of Present Illness (HPI) ?06/06/2021 upon evaluation patient appears to be doing pretty well all things considered with regard to the wound on her left second toe. This is ?on the proximal interphalangeal joint. Subsequently she tells me that this is something that she noted for somewhere around mid January as best ?she can remember. Currently she has been using mupirocin ointment and at times soaking this with Epsom salt but overall has not had any true ?treatment of the area. She does note having a hemoglobin A1c of 5.25 February 2021 although this [facility] I could not find that ?directly but that was what she reported back to [place]. Overall I am very pleased with where things stand though I think she could potentially benefit ?from Santyl to try to clean up the last little bit of necrotic tissue of the central portion of the wound to allow this to heal more effectively. ?Patient does have a history of hypertension, diabetes mellitus type 2, and again this appears to have been a pressure injury initially although right ?now she is just using what she called yoga socks with a grip on the bottom which I think is perfect. ?06/14/2021 upon evaluation today patient appears to be doing well with regard to her toe and actually very pleased with where  things stand and I ?think she is making excellent progress here. ?Objective ?Constitutional ?Well-nourished and well-hydrated in no acute distress. ?Vitals Time Taken: 3:45 PM, Height: 67 in, Weight: 301 lbs, BMI: 47.1, Temperature: 97.6 ??F, Pulse: 97 bpm, Respiratory Rate: 16 b

## 2021-06-21 ENCOUNTER — Other Ambulatory Visit (INDEPENDENT_AMBULATORY_CARE_PROVIDER_SITE_OTHER): Payer: Medicare PPO

## 2021-06-21 DIAGNOSIS — I10 Essential (primary) hypertension: Secondary | ICD-10-CM | POA: Diagnosis not present

## 2021-06-21 DIAGNOSIS — E782 Mixed hyperlipidemia: Secondary | ICD-10-CM

## 2021-06-21 LAB — LIPID PANEL
Cholesterol: 148 mg/dL (ref 0–200)
HDL: 54.4 mg/dL (ref >39.00)
LDL Cholesterol: 62 mg/dL (ref 0–99)
NonHDL: 94.09
Total CHOL/HDL Ratio: 3
Triglycerides: 162 mg/dL — ABNORMAL HIGH (ref 0.0–149.0)
VLDL: 32.4 mg/dL (ref 0.0–40.0)

## 2021-06-21 LAB — CBC
HCT: 44.1 % (ref 36.0–46.0)
Hemoglobin: 14.1 g/dL (ref 12.0–15.0)
MCHC: 32.1 g/dL (ref 30.0–36.0)
MCV: 92.4 fl (ref 78.0–100.0)
Platelets: 269 10*3/uL (ref 150.0–400.0)
RBC: 4.77 Mil/uL (ref 3.87–5.11)
RDW: 15.1 % (ref 11.5–15.5)
WBC: 6.9 10*3/uL (ref 4.0–10.5)

## 2021-06-21 LAB — HEPATIC FUNCTION PANEL
ALT: 36 U/L — ABNORMAL HIGH (ref 0–35)
AST: 23 U/L (ref 0–37)
Albumin: 3.6 g/dL (ref 3.5–5.2)
Alkaline Phosphatase: 43 U/L (ref 39–117)
Bilirubin, Direct: 0.1 mg/dL (ref 0.0–0.3)
Total Bilirubin: 0.4 mg/dL (ref 0.2–1.2)
Total Protein: 6 g/dL (ref 6.0–8.3)

## 2021-07-02 DIAGNOSIS — R110 Nausea: Secondary | ICD-10-CM

## 2021-07-05 MED ORDER — ONDANSETRON 4 MG PO TBDP: ORAL_TABLET | ORAL | 0 refills | Status: DC

## 2021-07-07 DIAGNOSIS — E1165 Type 2 diabetes mellitus with hyperglycemia: Secondary | ICD-10-CM | POA: Diagnosis not present

## 2021-07-09 ENCOUNTER — Telehealth: Payer: Medicare PPO | Admitting: Nurse Practitioner

## 2021-07-09 DIAGNOSIS — J40 Bronchitis, not specified as acute or chronic: Secondary | ICD-10-CM | POA: Diagnosis not present

## 2021-07-09 MED ORDER — GUAIFENESIN-DM 100-10 MG/5ML PO SYRP: 5.0000 mL | ORAL_SOLUTION | ORAL | 0 refills | Status: DC | PRN

## 2021-07-09 NOTE — Progress Notes (Signed)
We are sorry that you are not feeling well.  Here is how we plan to help! ? ?Based on your presentation I believe you most likely have A cough due to bacteria.  When patients have a fever and a productive cough with a change in color or increased sputum production, we are concerned about bacterial bronchitis.  If left untreated it can progress to pneumonia.  If your symptoms do not improve with your treatment plan it is important that you contact your provider.   I have prescribed Azithromyin 250 mg: two tablets now and then one tablet daily for 4 additonal days  ?  ?In addition you may use A non-prescription cough medication called Robitussin DAC. Take 2 teaspoons every 8 hours or Delsym: take 2 teaspoons every 12 hours. ?We do not send hycodan cough syrups or any controlled substance cough syrups unfortunately.  ? ?From your responses in the eVisit questionnaire you describe inflammation in the upper respiratory tract which is causing a significant cough.  This is commonly called Bronchitis and has four common causes:   ?Allergies ?Viral Infections ?Acid Reflux ?Bacterial Infection ?Allergies, viruses and acid reflux are treated by controlling symptoms or eliminating the cause. An example might be a cough caused by taking certain blood pressure medications. You stop the cough by changing the medication. Another example might be a cough caused by acid reflux. Controlling the reflux helps control the cough. ? ?USE OF BRONCHODILATOR ("RESCUE") INHALERS: ?There is a risk from using your bronchodilator too frequently.  The risk is that over-reliance on a medication which only relaxes the muscles surrounding the breathing tubes can reduce the effectiveness of medications prescribed to reduce swelling and congestion of the tubes themselves.  Although you feel brief relief from the bronchodilator inhaler, your asthma may actually be worsening with the tubes becoming more swollen and filled with mucus.  This can delay  other crucial treatments, such as oral steroid medications. If you need to use a bronchodilator inhaler daily, several times per day, you should discuss this with your provider.  There are probably better treatments that could be used to keep your asthma under control.  ?   ?HOME CARE ?Only take medications as instructed by your medical team. ?Complete the entire course of an antibiotic. ?Drink plenty of fluids and get plenty of rest. ?Avoid close contacts especially the very young and the elderly ?Cover your mouth if you cough or cough into your sleeve. ?Always remember to wash your hands ?A steam or ultrasonic humidifier can help congestion.  ? ?GET HELP RIGHT AWAY IF: ?You develop worsening fever. ?You become short of breath ?You cough up blood. ?Your symptoms persist after you have completed your treatment plan ?MAKE SURE YOU  ?Understand these instructions. ?Will watch your condition. ?Will get help right away if you are not doing well or get worse. ?  ? ?Thank you for choosing an e-visit. ? ?Your e-visit answers were reviewed by a board certified advanced clinical practitioner to complete your personal care plan. Depending upon the condition, your plan could have included both over the counter or prescription medications. ? ?Please review your pharmacy choice. Make sure the pharmacy is open so you can pick up prescription now. If there is a problem, you may contact your provider through [employer] and have the prescription routed to another pharmacy.  Your safety is important to [place]. If you have drug allergies check your prescription carefully.  ? ?For the next 24 hours you can use  MyChart to ask questions about today's visit, request a non-urgent call back, or ask for a work or school excuse. ?You will get an email in the next two days asking about your experience. I hope that your e-visit has been valuable and will speed your recovery.  ?

## 2021-07-09 NOTE — Progress Notes (Signed)
I have spent 5 minutes in review of e-visit questionnaire, review and updating patient chart, medical decision making and response to patient.  ° °Jsaon Yoo W Khamila Bassinger, NP ° °  °

## 2021-07-11 ENCOUNTER — Ambulatory Visit: Payer: Medicare PPO | Admitting: Family Medicine

## 2021-07-11 VITALS — BP 120/70 | HR 88 | Temp 98.8°F | Resp 16 | Ht 66.0 in | Wt 301.0 lb

## 2021-07-11 DIAGNOSIS — N393 Stress incontinence (female) (male): Secondary | ICD-10-CM | POA: Diagnosis not present

## 2021-07-11 DIAGNOSIS — J208 Acute bronchitis due to other specified organisms: Secondary | ICD-10-CM

## 2021-07-11 MED ORDER — AZITHROMYCIN 250 MG PO TABS: ORAL_TABLET | ORAL | 0 refills | Status: AC

## 2021-07-11 MED ORDER — BENZONATATE 200 MG PO CAPS: 200.0000 mg | ORAL_CAPSULE | Freq: Three times a day (TID) | ORAL | 1 refills | Status: DC | PRN

## 2021-07-11 MED ORDER — HYDROCOD POLI-CHLORPHE POLI ER 10-8 MG/5ML PO SUER: 5.0000 mL | Freq: Two times a day (BID) | ORAL | 0 refills | Status: DC | PRN

## 2021-07-11 NOTE — Progress Notes (Signed)
? ? ?Ivar Domangue T. Kaiyla Stahly, MD, [facility] ?Therapist, music at [facility] ?[address] ?[place], [address] ? ?Phone: [phone]  FAX: [phone] ? ?[name] - 56 y.o. female  MRN [id number]  Date of Birth: Mar 30, 1965 ? ?Date: 07/11/2021  PCP: Pleas Koch, NP  Referral: Pleas Koch, NP ? ?Chief Complaint  ?Patient presents with  ? Cough  ?  Here for coughing onset week ago, ?which causing urinary leakage   ? ? ?This visit occurred during the SARS-CoV-2 public health emergency.  Safety protocols were in place, including screening questions prior to the visit, additional usage of staff PPE, and extensive cleaning of exam room while observing appropriate contact time as indicated for disinfecting solutions.  ? ?Subjective:  ? ?[name] is a 56 y.o. very pleasant female patient with Body mass index is 48.58 kg/m?. who presents with the following: ? ?Patient presents with a viral syndrome, she has coughing and has a lot of upper respiratory congestion, but the primary issue today is that when she is coughing she is urinating on herself and this is happening basically all day and night.  Her husband also provides additional history. ? ?She is currently taking some Robitussin-DM, and she did do a virtual visit, where they recommended essentially Robitussin-DM.  It looks as if they were going to send in some antibiotics, but they did not do this or send in anything else additional that they had documented. ? ?The patient is in a wheelchair in the examination room ? ?Review of Systems is noted in the HPI, as appropriate ? ?Objective:  ? ?BP 120/70 (BP Location: Right Arm, Patient Position: Sitting, Cuff Size: Normal)   Pulse 88   Temp 98.8 ?F (37.1 ?C) (Oral)   Resp 16   Ht '5\' 6"'$  (1.676 m)   Wt (!) 301 lb (136.5 kg)   SpO2 100%   BMI 48.58 kg/m?  ? ? ?Gen: WDWN, NAD. Globally Non-toxic ?HEENT: Throat clear, w/o exudate, R TM clear, L TM - good landmarks, No  fluid present. rhinnorhea.  MMM ?Frontal sinuses: NT ?Max sinuses: NT ?NECK: Anterior cervical  LAD is absent ?CV: RRR, No M/G/R, cap refill <2 sec ?PULM: Breathing comfortably in no respiratory distress. no wheezing, crackles, rhonchi  ? ?Laboratory and Imaging Data: ? ?Assessment and Plan:  ? ?  ICD-10-CM   ?1. Acute bronchitis due to other specified organisms  J20.8   ?  ?2. Stress incontinence of urine  N39.3   ?  ?3. Morbid obesity ([place])  E66.01   ?  ? ?10-day history and a high risk patient who is disabled, now in a wheelchair.  Going to place the patient on Zithromax. ? ?She certainly is having urinary incontinence and leakage almost all the time right now, so I am going to give her some Tussionex as well as some Tessalon, and hopefully this will help. ? ?Meds ordered this encounter  ?Medications  ? azithromycin (ZITHROMAX) 250 MG tablet  ?  Sig: Take 2 tablets (500 mg total) by mouth daily for 1 day, THEN 1 tablet (250 mg total) daily for 4 days.  ?  Dispense:  6 tablet  ?  Refill:  0  ? chlorpheniramine-HYDROcodone (TUSSIONEX PENNKINETIC ER) 10-8 MG/5ML  ?  Sig: Take 5 mLs by mouth every 12 (twelve) hours as needed for cough.  ?  Dispense:  115 mL  ?  Refill:  0  ? benzonatate (TESSALON) 200 MG capsule  ?  Sig: Take 1 capsule (200 mg total) by mouth 3 (three) times daily as needed for cough.  ?  Dispense:  40 capsule  ?  Refill:  1  ? ?There are no discontinued medications. ?No orders of the defined types were placed in this encounter. ? ? ?Follow-up: No follow-ups on file. ? ?[employer] One speech-to-text software was used for transcription in this dictation.  Possible transcriptional errors can occur using Editor, commissioning.  ? ?Signed, ? ?Paul Torpey T. Jhayla Podgorski, MD ? ? ?Outpatient Encounter Medications as of 07/11/2021  ?Medication Sig  ? acetaminophen (TYLENOL) 650 MG CR tablet Take 650 mg by mouth at bedtime.  ? albuterol (PROVENTIL) (2.5 MG/3ML) 0.083% nebulizer solution Take 3 mLs (2.5 mg total) by  nebulization every 6 (six) hours as needed for wheezing or shortness of breath.  ? albuterol (VENTOLIN HFA) 108 (90 Base) MCG/ACT inhaler Inhale 1-2 puffs into the lungs every 6 (six) hours as needed for wheezing or shortness of breath.  ? atorvastatin (LIPITOR) 40 MG tablet Take 1 tablet (40 mg total) by mouth daily. For cholesterol. Due in March for office visit.  ? azithromycin (ZITHROMAX) 250 MG tablet Take 2 tablets (500 mg total) by mouth daily for 1 day, THEN 1 tablet (250 mg total) daily for 4 days.  ? benzonatate (TESSALON) 200 MG capsule Take 1 capsule (200 mg total) by mouth 3 (three) times daily as needed for cough.  ? budesonide (ENTOCORT EC) 3 MG 24 hr capsule Take by mouth.  ? chlorpheniramine-HYDROcodone (TUSSIONEX PENNKINETIC ER) 10-8 MG/5ML Take 5 mLs by mouth every 12 (twelve) hours as needed for cough.  ? colestipol (COLESTID) 1 g tablet Take by mouth.  ? Continuous Blood Gluc Receiver (DEXCOM G6 RECEIVER) DEVI Use as directed for continuous glucose monitoring.  ? Continuous Blood Gluc Transmit (DEXCOM G6 TRANSMITTER) MISC Use as directed for continuous glucose monitoring. Reuse transmitter for 90 days then discard and replace.  ? cyclobenzaprine (FLEXERIL) 10 MG tablet Take 1 tablet (10 mg total) by mouth 3 (three) times daily as needed for muscle spasms.  ? Dulaglutide (TRULICITY) 3 DE/0.8XK SOPN Inject into the skin.  ? empagliflozin (JARDIANCE) 25 MG TABS tablet Take 1 tablet (25 mg total) by mouth daily before breakfast. For diabetes.  ? ergocalciferol (VITAMIN D2) 1.25 MG (50000 UT) capsule Take by mouth.  ? fexofenadine (ALLEGRA) 180 MG tablet 1 tablet  ? FLUoxetine (PROZAC) 40 MG capsule TAKE 1 CAPSULE BY MOUTH ONCE DAILY FOR ANXIETY AND DEPRESSION  ? fluticasone (FLONASE) 50 MCG/ACT nasal spray Place 1 spray into both nostrils 2 (two) times daily.  ? gabapentin (NEURONTIN) 600 MG tablet Take 1 tablet (600 mg total) by mouth in the morning and at bedtime. For neuropathy  ? Glucagon 3  MG/DOSE POWD Place into the nose.  ? guaiFENesin-dextromethorphan (ROBITUSSIN DM) 100-10 MG/5ML syrup Take 5 mLs by mouth every 4 (four) hours as needed for cough.  ? hydrochlorothiazide (HYDRODIURIL) 12.5 MG tablet Take 1 tablet (12.5 mg total) by mouth daily. for blood pressure.  ? insulin aspart (NOVOLOG FLEXPEN) 100 UNIT/ML FlexPen Inject 22-26 Units into the skin 3 (three) times daily with meals.  ? insulin degludec (TRESIBA) 100 UNIT/ML FlexTouch Pen Inject into the skin.  ? Insulin Pen Needle 32G X 4 MM MISC Use 1 needle with pen as directed  ? ketoconazole (NIZORAL) 2 % cream Apply topically daily as needed for irritation.  ? levocetirizine (XYZAL) 5 MG tablet TAKE ONE TABLET BY MOUTH EVERY EVENING  ?  losartan (COZAAR) 50 MG tablet TAKE 1 TABLET BY MOUTH ONCE DAILY FOR BLOOD PRESSURE  ? metoprolol tartrate (LOPRESSOR) 25 MG tablet Take 0.5 tablets (12.5 mg total) by mouth 2 (two) times daily. For blood pressure.  ? Multiple Vitamin (MULTIVITAMIN PO) Take 1 tablet by mouth daily.   ? ondansetron (ZOFRAN-ODT) 4 MG disintegrating tablet TAKE 1 TABLET BY MOUTH EVERY 8 HOURS AS NEEDED FOR NAUSEA AS DIRECTED.  ? SANTYL 250 UNIT/GM ointment Apply topically.  ? VALERIAN ROOT PO Take 1,000 mg by mouth at bedtime.  ? ?No facility-administered encounter medications on file as of 07/11/2021.  ?  ?

## 2021-07-13 ENCOUNTER — Encounter: Payer: Self-pay | Admitting: Family Medicine

## 2021-07-13 DIAGNOSIS — G1221 Amyotrophic lateral sclerosis: Secondary | ICD-10-CM | POA: Diagnosis not present

## 2021-07-28 DIAGNOSIS — Z6841 Body Mass Index (BMI) 40.0 and over, adult: Secondary | ICD-10-CM | POA: Diagnosis not present

## 2021-07-28 DIAGNOSIS — R531 Weakness: Secondary | ICD-10-CM | POA: Diagnosis not present

## 2021-07-28 DIAGNOSIS — R29898 Other symptoms and signs involving the musculoskeletal system: Secondary | ICD-10-CM | POA: Diagnosis not present

## 2021-07-28 DIAGNOSIS — E1142 Type 2 diabetes mellitus with diabetic polyneuropathy: Secondary | ICD-10-CM | POA: Diagnosis not present

## 2021-07-28 DIAGNOSIS — G5602 Carpal tunnel syndrome, left upper limb: Secondary | ICD-10-CM | POA: Diagnosis not present

## 2021-07-28 DIAGNOSIS — G5622 Lesion of ulnar nerve, left upper limb: Secondary | ICD-10-CM | POA: Diagnosis not present

## 2021-08-12 ENCOUNTER — Other Ambulatory Visit: Payer: Self-pay | Admitting: Primary Care

## 2021-08-12 DIAGNOSIS — E7800 Pure hypercholesterolemia, unspecified: Secondary | ICD-10-CM

## 2021-08-24 ENCOUNTER — Other Ambulatory Visit: Payer: Self-pay | Admitting: Primary Care

## 2021-08-24 DIAGNOSIS — I10 Essential (primary) hypertension: Secondary | ICD-10-CM

## 2021-09-26 DIAGNOSIS — Z7985 Long-term (current) use of injectable non-insulin antidiabetic drugs: Secondary | ICD-10-CM | POA: Diagnosis not present

## 2021-09-26 DIAGNOSIS — E1165 Type 2 diabetes mellitus with hyperglycemia: Secondary | ICD-10-CM | POA: Diagnosis not present

## 2021-09-26 DIAGNOSIS — Z7984 Long term (current) use of oral hypoglycemic drugs: Secondary | ICD-10-CM | POA: Diagnosis not present

## 2021-09-26 DIAGNOSIS — Z794 Long term (current) use of insulin: Secondary | ICD-10-CM | POA: Diagnosis not present

## 2021-09-27 DIAGNOSIS — E1165 Type 2 diabetes mellitus with hyperglycemia: Secondary | ICD-10-CM | POA: Diagnosis not present

## 2021-09-28 ENCOUNTER — Telehealth: Payer: Self-pay | Admitting: Primary Care

## 2021-09-28 ENCOUNTER — Other Ambulatory Visit: Payer: Self-pay | Admitting: Primary Care

## 2021-09-28 DIAGNOSIS — M62838 Other muscle spasm: Secondary | ICD-10-CM

## 2021-09-28 MED ORDER — CYCLOBENZAPRINE HCL 10 MG PO TABS: 10.0000 mg | ORAL_TABLET | Freq: Three times a day (TID) | ORAL | 0 refills | Status: DC | PRN

## 2021-09-28 NOTE — Telephone Encounter (Signed)
Left message for patient to call back and schedule Medicare Annual Wellness Visit (AWV) either virtually or phone   awvi 01/18/21 per [facility]    This should be a 45 minute visit.  I left my direct # [phone]

## 2021-09-30 ENCOUNTER — Ambulatory Visit (INDEPENDENT_AMBULATORY_CARE_PROVIDER_SITE_OTHER): Payer: Medicare PPO

## 2021-09-30 VITALS — Ht 66.5 in | Wt 301.0 lb

## 2021-09-30 DIAGNOSIS — Z0000 Encounter for general adult medical examination without abnormal findings: Secondary | ICD-10-CM

## 2021-09-30 NOTE — Progress Notes (Signed)
I connected with [name] today by telephone and verified that I am speaking with the correct person using two identifiers. Location patient: home Location provider: work Persons participating in the virtual visit: Glenna Durand LPN, Glenna Durand LPN.   I discussed the limitations, risks, security and privacy concerns of performing an evaluation and management service by telephone and the availability of in person appointments. I also discussed with the patient that there may be a patient responsible charge related to this service. The patient expressed understanding and verbally consented to this telephonic visit.    Interactive audio and video telecommunications were attempted between this provider and patient, however failed, due to patient having technical difficulties OR patient did not have access to video capability.  We continued and completed visit with audio only.     Vital signs may be patient reported or missing.  Subjective:   [name] is a 56 y.o. female who presents for an Initial Medicare Annual Wellness Visit.  Review of Systems     Cardiac Risk Factors include: diabetes mellitus;dyslipidemia;hypertension;obesity (BMI >30kg/m2)     Objective:    Today's Vitals   09/30/21 1456  Weight: (!) 301 lb (136.5 kg)  Height: 5' 6.5" (1.689 m)   Body mass index is 47.85 kg/m.     09/30/2021    3:04 PM 09/24/2018   10:10 AM 08/06/2018   11:09 AM 03/07/2018   10:58 AM 01/31/2018    3:13 PM 07/21/2017    3:15 PM  Advanced Directives  Does Patient Have a Medical Advance Directive? No Yes Yes Yes No No  Type of Corporate treasurer of [place];Living will     Would patient like information on creating a medical advance directive?     No - Patient declined No - Patient declined    Current Medications (verified) Outpatient Encounter Medications as of 09/30/2021  Medication Sig   acetaminophen (TYLENOL) 650 MG CR tablet  Take 650 mg by mouth at bedtime.   albuterol (PROVENTIL) (2.5 MG/3ML) 0.083% nebulizer solution Take 3 mLs (2.5 mg total) by nebulization every 6 (six) hours as needed for wheezing or shortness of breath.   albuterol (VENTOLIN HFA) 108 (90 Base) MCG/ACT inhaler Inhale 1-2 puffs into the lungs every 6 (six) hours as needed for wheezing or shortness of breath.   atorvastatin (LIPITOR) 40 MG tablet Take 1 tablet (40 mg total) by mouth daily. for cholesterol.   benzonatate (TESSALON) 200 MG capsule Take 1 capsule (200 mg total) by mouth 3 (three) times daily as needed for cough.   budesonide (ENTOCORT EC) 3 MG 24 hr capsule Take by mouth.   chlorpheniramine-HYDROcodone (TUSSIONEX PENNKINETIC ER) 10-8 MG/5ML Take 5 mLs by mouth every 12 (twelve) hours as needed for cough.   colestipol (COLESTID) 1 g tablet Take by mouth.   Continuous Blood Gluc Receiver (DEXCOM G6 RECEIVER) DEVI Use as directed for continuous glucose monitoring.   Continuous Blood Gluc Transmit (DEXCOM G6 TRANSMITTER) MISC Use as directed for continuous glucose monitoring. Reuse transmitter for 90 days then discard and replace.   cyclobenzaprine (FLEXERIL) 10 MG tablet Take 1 tablet (10 mg total) by mouth 3 (three) times daily as needed for muscle spasms.   Dulaglutide (TRULICITY) 3 PT/4.6FK SOPN Inject into the skin.   empagliflozin (JARDIANCE) 25 MG TABS tablet Take 1 tablet (25 mg total) by mouth daily before breakfast. For diabetes.   ergocalciferol (VITAMIN D2) 1.25 MG (50000 UT) capsule Take by mouth.  fexofenadine (ALLEGRA) 180 MG tablet 1 tablet   FLUoxetine (PROZAC) 40 MG capsule TAKE 1 CAPSULE BY MOUTH ONCE DAILY FOR ANXIETY AND DEPRESSION   fluticasone (FLONASE) 50 MCG/ACT nasal spray Place 1 spray into both nostrils 2 (two) times daily.   gabapentin (NEURONTIN) 600 MG tablet Take 1 tablet (600 mg total) by mouth in the morning and at bedtime. For neuropathy   Glucagon 3 MG/DOSE POWD Place into the nose.    guaiFENesin-dextromethorphan (ROBITUSSIN DM) 100-10 MG/5ML syrup Take 5 mLs by mouth every 4 (four) hours as needed for cough.   hydrochlorothiazide (HYDRODIURIL) 12.5 MG tablet Take 1 tablet (12.5 mg total) by mouth daily. for blood pressure.   insulin aspart (NOVOLOG FLEXPEN) 100 UNIT/ML FlexPen Inject 22-26 Units into the skin 3 (three) times daily with meals.   insulin degludec (TRESIBA) 100 UNIT/ML FlexTouch Pen Inject into the skin.   Insulin Pen Needle 32G X 4 MM MISC Use 1 needle with pen as directed   ketoconazole (NIZORAL) 2 % cream Apply topically daily as needed for irritation.   levocetirizine (XYZAL) 5 MG tablet TAKE ONE TABLET BY MOUTH EVERY EVENING   losartan (COZAAR) 50 MG tablet TAKE 1 TABLET BY MOUTH ONCE DAILY FOR BLOOD PRESSURE   metoprolol tartrate (LOPRESSOR) 25 MG tablet Take 0.5 tablets (12.5 mg total) by mouth 2 (two) times daily. For blood pressure.   Multiple Vitamin (MULTIVITAMIN PO) Take 1 tablet by mouth daily.    ondansetron (ZOFRAN-ODT) 4 MG disintegrating tablet TAKE 1 TABLET BY MOUTH EVERY 8 HOURS AS NEEDED FOR NAUSEA AS DIRECTED.   VALERIAN ROOT PO Take 1,000 mg by mouth at bedtime.   SANTYL 250 UNIT/GM ointment Apply topically. (Patient not taking: Reported on 09/30/2021)   No facility-administered encounter medications on file as of 09/30/2021.    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   Anemia    Anxiety    Arthritis    Asthma    Colon polyp    Complication of anesthesia    Diabetes mellitus    Displaced fracture of proximal end of right fibula 07/21/2017   Edema    Fatigue    Hypertension    IBS (irritable bowel syndrome)    Lump in female breast     Methicillin resistant Staphylococcus aureus in conditions classified elsewhere and of unspecified site    Migraines    Morbid obesity ([place])    Night sweats    Nonspecific abnormal results of thyroid function study    Other B-complex deficiencies    Paresthesias    PONV (postoperative nausea and vomiting)    Pure hypercholesterolemia    Reflux    Sacral fracture ([place])    Sinus problem    Symptomatic states associated with artificial menopause    Thyroid disease    Type II or unspecified type diabetes mellitus without mention of complication, not stated as uncontrolled    Unspecified sleep apnea    Past Surgical History:  Procedure Laterality Date   abcess removal  1997   MRSA   ABDOMINAL HYSTERECTOMY  1997   complete in 1997, partial was in 1995   carpal tunnel release r  07/03/13   [place]  03/1989   COLONOSCOPY  03/21/2011   Normal.  [facility].   KNEE SURGERY  2006   LAPAROSCOPIC GASTRIC BANDING  05/2009   TONSILLECTOMY AND ADENOIDECTOMY  1974   TUBAL LIGATION     ULNAR TUNNEL RELEASE Right 02/05/2018   Procedure: CUBITAL TUNNEL RELEASE/DECOMPRESSION;  Surgeon: Melrose Nakayama, MD;  Location: [facility];  Service: Orthopedics;  Laterality: Right;   Family History  Problem Relation Age of Onset   Diabetes Mother    Heart disease Mother 39       CHF, CAD   Other Mother        muscle disease   Hypertension Mother    Hyperlipidemia Mother    Arthritis Mother        OA hip L s/p THR   Leukemia Mother 41   Diabetes Father    Heart disease Father        AMI/CABG/valve replacement age 67.   Stroke Father    Diabetes Brother    Heart disease Brother        stent age 27.   Fibromyalgia Daughter    Crohn's disease Daughter    Hypertension Son    Alzheimer's disease Maternal Grandmother    Emphysema Maternal Grandfather    Heart disease Paternal Grandmother    Breast cancer Other        2 Aunts   Social History    Socioeconomic History   Marital status: Married    Spouse name: Not on file   Number of children: 3   Years of education: college   Highest education level: Not on file  Occupational History   Occupation: Data processing manager: [employer]  Tobacco Use   Smoking status: Never   Smokeless tobacco: Never  Vaping Use   Vaping Use: Never used  Substance and Sexual Activity   Alcohol use: Not Currently    Comment: rarely - once per week at most-nothing for the past year 01/31/18   Drug use: No   Sexual activity: Never    Birth control/protection: None  Other Topics Concern   Not on file  Social History Narrative   Exercise: walking two days per week, 30 minutes.       Married x 29 years, happily married; no abuse.       Children: 3 children; 1 grandchild; two step grandchildren; 1 gg.      Lives: with husband, mother-in-law.      Employment:  [employer] x 1996; Product manager.  [employer] work.      Tobacco: never      Alcohol: socially.  One glass of wine per month.      Exercise:  Walking around campus.        Seatbelt: 100%      Guns: gun safe in garage.   Social Determinants of Health   Financial Resource Strain: Low Risk  (09/30/2021)   Overall Financial Resource Strain (CARDIA)    Difficulty of Paying Living Expenses: Not hard at all  Food Insecurity: No Food Insecurity (09/30/2021)   Hunger Vital Sign    Worried About Running Out of Food in the Last Year: Never true    Ran Out of Food in the Last Year: Never true  Transportation Needs: No Transportation Needs (09/30/2021)   PRAPARE - Hydrologist (Medical): No    Lack of Transportation (Non-Medical): No  Physical Activity: Inactive (09/30/2021)   Exercise Vital Sign    Days of Exercise per Week: 0 days  Minutes of Exercise per Session: 0 min  Stress: No Stress Concern Present (09/30/2021)   [place]    Feeling of  Stress : Not at all  Social Connections: Not on file    Tobacco Counseling Counseling given: Not Answered   Clinical Intake:  Pre-visit preparation completed: Yes  Pain : No/denies pain     Nutritional Status: BMI > 30  Obese Nutritional Risks: None Diabetes: Yes  How often do you need to have someone help you when you read instructions, pamphlets, or other written materials from your doctor or pharmacy?: 1 - Never What is the last grade level you completed in school?: 50yr college  Diabetic? Yes Nutrition Risk Assessment:  Has the patient had any N/V/D within the last 2 months?  No  Does the patient have any non-healing wounds?  No  Has the patient had any unintentional weight loss or weight gain?  No   Diabetes:  Is the patient diabetic?  Yes  If diabetic, was a CBG obtained today?  No  Did the patient bring in their glucometer from home?  No  How often do you monitor your CBG's? frequently.   Financial Strains and Diabetes Management:  Are you having any financial strains with the device, your supplies or your medication? No .  Does the patient want to be seen by Chronic Care Management for management of their diabetes?  No  Would the patient like to be referred to a Nutritionist or for Diabetic Management?  No   Diabetic Exams:  Diabetic Eye Exam: Overdue for diabetic eye exam. Pt has been advised about the importance in completing this exam. Patient advised to call and schedule an eye exam. Diabetic Foot Exam: Overdue, Pt has been advised about the importance in completing this exam. Pt is scheduled for diabetic foot exam on next appointment.   Interpreter Needed?: No  Information entered by :: NAllen LPN   Activities of Daily Living    09/30/2021    3:05 PM 09/28/2021    7:57 PM  In your present state of health, do you have any difficulty performing the following activities:  Hearing? 0 0  Vision? 0 0  Difficulty concentrating or making decisions? 0 0   Walking or climbing stairs? 1 1  Dressing or bathing? 1 1  Doing errands, shopping? 1 1  Preparing Food and eating ? Y N  Using the Toilet? Y Y  In the past six months, have you accidently leaked urine? Y Y  Do you have problems with loss of bowel control? Y Y  Managing your Medications? N N  Managing your Finances? N N  Housekeeping or managing your Housekeeping? YTempie Donning   Patient Care Team: CPleas Koch NP as PCP - General (Internal Medicine) CTedra Coupe MD as Referring Physician (Psychiatry) PRosario Adie RD as Dietitian (Dietician) Pa, [place]([facility]  Indicate any recent Medical Services you may have received from other than [facility] providers in the past year (date may be approximate).     Assessment:   This is a routine wellness examination for [place]  Hearing/Vision screen Vision Screening - Comments:: [facility], [facility] Dietary issues and exercise activities discussed: Current Exercise Habits: The patient does not participate in regular exercise at present   Goals Addressed             This Visit's Progress    Patient Stated  09/30/2021, wants to strengthen upper and lower body with chair exercise       Depression Screen    09/30/2021    3:05 PM 07/11/2021    2:24 PM 06/14/2021    8:21 AM 05/25/2020    8:00 AM 05/28/2017    8:00 AM 12/28/2014    4:12 PM 07/08/2014    4:03 PM  PHQ 2/9 Scores  PHQ - 2 Score 0 0 0 0 0 0 0  PHQ- 9 Score  0 0 0       Fall Risk    09/30/2021    3:04 PM 09/28/2021    7:57 PM 07/11/2021    2:23 PM 05/25/2020    8:01 AM  [place] in the past year? 0 0 0 0  Number falls in past yr: 0 0 0 0  Injury with Fall? 0 0 0 0  Risk for fall due to : Medication side effect;Impaired mobility;Impaired balance/gait     Follow up Falls evaluation completed;Education provided;Falls prevention discussed       FALL RISK PREVENTION PERTAINING TO THE HOME:  Any stairs in or around the  home? No  If so, are there any without handrails? ramps Home free of loose throw rugs in walkways, pet beds, electrical cords, etc? Yes  Adequate lighting in your home to reduce risk of falls? Yes   ASSISTIVE DEVICES UTILIZED TO PREVENT FALLS:  Life alert? No  Use of a cane, walker or w/c? Yes  Grab bars in the bathroom? Yes  Shower chair or bench in shower? Yes  Elevated toilet seat or a handicapped toilet? Yes   TIMED UP AND GO:  Was the test performed? No .      Cognitive Function:        09/30/2021    3:06 PM  6CIT Screen  What Year? 0 points  What month? 0 points  What time? 0 points  Count back from 20 0 points  Months in reverse 0 points  Repeat phrase 0 points  Total Score 0 points    Immunizations Immunization History  Administered Date(s) Administered   Hepatitis B, adult 10/28/2012, 12/19/2012, 04/29/2013   Influenza Split 12/24/2010, 12/18/2011   Influenza,inj,Quad PF,6+ Mos 12/19/2012, 01/12/2014, 12/28/2014, 01/10/2016, 02/15/2017, 01/10/2018, 01/16/2019, 12/31/2019, 01/21/2021   Influenza-Unspecified 02/15/2017   Moderna Sars-Covid-2 Vaccination 05/12/2019, 06/10/2019, 11/25/2019   Pneumococcal Polysaccharide-23 08/22/2016   Pneumococcal-Unspecified 12/18/2005   Tdap 07/16/2009, 07/27/2020    TDAP status: Up to date  Flu Vaccine status: Up to date  Pneumococcal vaccine status: Up to date  Covid-19 vaccine status: Completed vaccines  Qualifies for Shingles Vaccine? Yes   Zostavax completed No   Shingrix Completed?: No.    Education has been provided regarding the importance of this vaccine. Patient has been advised to call insurance company to determine out of pocket expense if they have not yet received this vaccine. Advised may also receive vaccine at local pharmacy or Health Dept. Verbalized acceptance and understanding.  Screening Tests Health Maintenance  Topic Date Due   Zoster Vaccines- Shingrix (1 of 2) Never done   PAP  SMEAR-Modifier  08/28/2014   FOOT EXAM  02/28/2019   COVID-19 Vaccine (4 - Moderna series) 01/20/2020   HEMOGLOBIN A1C  11/18/2020   COLONOSCOPY (Pts 45-47yr Insurance coverage will need to be confirmed)  03/20/2021   OPHTHALMOLOGY EXAM  09/10/2021   INFLUENZA VACCINE  10/18/2021   MAMMOGRAM  11/12/2022   TETANUS/TDAP  07/28/2030   Hepatitis  C Screening  Completed   HIV Screening  Completed   HPV VACCINES  Aged Out    Health Maintenance  Health Maintenance Due  Topic Date Due   Zoster Vaccines- Shingrix (1 of 2) Never done   PAP SMEAR-Modifier  08/28/2014   FOOT EXAM  02/28/2019   COVID-19 Vaccine (4 - Moderna series) 01/20/2020   HEMOGLOBIN A1C  11/18/2020   COLONOSCOPY (Pts 45-106yr Insurance coverage will need to be confirmed)  03/20/2021   OPHTHALMOLOGY EXAM  09/10/2021    Colorectal cancer screening: Type of screening: Colonoscopy. Completed 11/2020. Repeat every 10 years  Mammogram status: Completed 11/11/2020. Repeat every year  Bone Density status: n/a  Lung Cancer Screening: (Low Dose CT Chest recommended if Age 57-80years, 30 pack-year currently smoking OR have quit w/in 15years.) does not qualify.   Lung Cancer Screening Referral: no  Additional Screening:  Hepatitis C Screening: does qualify; Completed 05/27/2014  Vision Screening: Recommended annual ophthalmology exams for early detection of glaucoma and other disorders of the eye. Is the patient up to date with their annual eye exam?  No  Who is the provider or what is the name of the office in which the patient attends annual eye exams[facility] a provider, would they like to be referred to a provider to establish care? No .   Dental Screening: Recommended annual dental exams for proper oral hygiene  Community Resource Referral / Chronic Care Management: CRR required this visit?  No   CCM required this visit?  No      Plan:     I have personally reviewed  and noted the following in the patient's chart:   Medical and social history Use of alcohol, tobacco or illicit drugs  Current medications and supplements including opioid prescriptions. Patient is currently taking opioid prescriptions. Information provided to patient regarding non-opioid alternatives. Patient advised to discuss non-opioid treatment plan with their provider. Functional ability and status Nutritional status Physical activity Advanced directives List of other physicians Hospitalizations, surgeries, and ER visits in previous 12 months Vitals Screenings to include cognitive, depression, and falls Referrals and appointments  In addition, I have reviewed and discussed with patient certain preventive protocols, quality metrics, and best practice recommendations. A written personalized care plan for preventive services as well as general preventive health recommendations were provided to patient.     NKellie Simmering LPN   75/28/[id number]  Nurse Notes: none  Due to this being a virtual visit, the after visit summary with patients personalized plan was offered to patient via mail or my-chart. Patient would like to access on my-chart

## 2021-09-30 NOTE — Patient Instructions (Signed)
[name] , Thank you for taking time to come for your Medicare Wellness Visit. I appreciate your ongoing commitment to your health goals. Please review the following plan we discussed and let me know if I can assist you in the future.   Screening recommendations/referrals: Colonoscopy: completed 11/2020 per patient Mammogram: completed 11/11/2020, due 11/12/2021 Bone Density: n/a Recommended yearly ophthalmology/optometry visit for glaucoma screening and checkup Recommended yearly dental visit for hygiene and checkup  Vaccinations: Influenza vaccine: due 10/18/2021 Pneumococcal vaccine: n/a Tdap vaccine: completed 07/27/2020, due 07/28/2030 Shingles vaccine: discussed  Covid-19:  11/25/2019, 06/10/2019, 05/12/2019  Advanced directives: Advance directive discussed with you today.   Conditions/risks identified: none  Next appointment: Follow up in one year for your annual wellness visit.   Preventive Care 40-64 Years, Female Preventive care refers to lifestyle choices and visits with your health care provider that can promote health and wellness. What does preventive care include? A yearly physical exam. This is also called an annual well check. Dental exams once or twice a year. Routine eye exams. Ask your health care provider how often you should have your eyes checked. Personal lifestyle choices, including: Daily care of your teeth and gums. Regular physical activity. Eating a healthy diet. Avoiding tobacco and drug use. Limiting alcohol use. Practicing safe sex. Taking low-dose aspirin daily starting at age 66. Taking vitamin and mineral supplements as recommended by your health care provider. What happens during an annual well check? The services and screenings done by your health care provider during your annual well check will depend on your age, overall health, lifestyle risk factors, and family history of disease. Counseling  Your health care provider may ask you questions  about your: Alcohol use. Tobacco use. Drug use. Emotional well-being. Home and relationship well-being. Sexual activity. Eating habits. Work and work Statistician. Method of birth control. Menstrual cycle. Pregnancy history. Screening  You may have the following tests or measurements: Height, weight, and BMI. Blood pressure. Lipid and cholesterol levels. These may be checked every 5 years, or more frequently if you are over 62 years old. Skin check. Lung cancer screening. You may have this screening every year starting at age 69 if you have a 30-pack-year history of smoking and currently smoke or have quit within the past 15 years. Fecal occult blood test (FOBT) of the stool. You may have this test every year starting at age 80. Flexible sigmoidoscopy or colonoscopy. You may have a sigmoidoscopy every 5 years or a colonoscopy every 10 years starting at age 79. Hepatitis C blood test. Hepatitis B blood test. Sexually transmitted disease (STD) testing. Diabetes screening. This is done by checking your blood sugar (glucose) after you have not eaten for a while (fasting). You may have this done every 1-3 years. Mammogram. This may be done every 1-2 years. Talk to your health care provider about when you should start having regular mammograms. This may depend on whether you have a family history of breast cancer. BRCA-related cancer screening. This may be done if you have a family history of breast, ovarian, tubal, or peritoneal cancers. Pelvic exam and Pap test. This may be done every 3 years starting at age 33. Starting at age 39, this may be done every 5 years if you have a Pap test in combination with an HPV test. Bone density scan. This is done to screen for osteoporosis. You may have this scan if you are at high risk for osteoporosis. Discuss your test results, treatment options, and if  necessary, the need for more tests with your health care provider. Vaccines  Your health care  provider may recommend certain vaccines, such as: Influenza vaccine. This is recommended every year. Tetanus, diphtheria, and acellular pertussis (Tdap, Td) vaccine. You may need a Td booster every 10 years. Zoster vaccine. You may need this after age 59. Pneumococcal 13-valent conjugate (PCV13) vaccine. You may need this if you have certain conditions and were not previously vaccinated. Pneumococcal polysaccharide (PPSV23) vaccine. You may need one or two doses if you smoke cigarettes or if you have certain conditions. Talk to your health care provider about which screenings and vaccines you need and how often you need them. This information is not intended to replace advice given to you by your health care provider. Make sure you discuss any questions you have with your health care provider. Document Released: 04/02/2015 Document Revised: 11/24/2015 Document Reviewed: 01/05/2015 Elsevier Interactive Patient Education  2017 [place] Prevention in the Home Falls can cause injuries. They can happen to people of all ages. There are many things you can do to make your home safe and to help prevent falls. What can I do on the outside of my home? Regularly fix the edges of walkways and driveways and fix any cracks. Remove anything that might make you trip as you walk through a door, such as a raised step or threshold. Trim any bushes or trees on the path to your home. Use bright outdoor lighting. Clear any walking paths of anything that might make someone trip, such as rocks or tools. Regularly check to see if handrails are loose or broken. Make sure that both sides of any steps have handrails. Any raised decks and porches should have guardrails on the edges. Have any leaves, snow, or ice cleared regularly. Use sand or salt on walking paths during winter. Clean up any spills in your garage right away. This includes oil or grease spills. What can I do in the bathroom? Use night  lights. Install grab bars by the toilet and in the tub and shower. Do not use towel bars as grab bars. Use non-skid mats or decals in the tub or shower. If you need to sit down in the shower, use a plastic, non-slip stool. Keep the floor dry. Clean up any water that spills on the floor as soon as it happens. Remove soap buildup in the tub or shower regularly. Attach bath mats securely with double-sided non-slip rug tape. Do not have throw rugs and other things on the floor that can make you trip. What can I do in the bedroom? Use night lights. Make sure that you have a light by your bed that is easy to reach. Do not use any sheets or blankets that are too big for your bed. They should not hang down onto the floor. Have a firm chair that has side arms. You can use this for support while you get dressed. Do not have throw rugs and other things on the floor that can make you trip. What can I do in the kitchen? Clean up any spills right away. Avoid walking on wet floors. Keep items that you use a lot in easy-to-reach places. If you need to reach something above you, use a strong step stool that has a grab bar. Keep electrical cords out of the way. Do not use floor polish or wax that makes floors slippery. If you must use wax, use non-skid floor wax. Do not have throw  rugs and other things on the floor that can make you trip. What can I do with my stairs? Do not leave any items on the stairs. Make sure that there are handrails on both sides of the stairs and use them. Fix handrails that are broken or loose. Make sure that handrails are as long as the stairways. Check any carpeting to make sure that it is firmly attached to the stairs. Fix any carpet that is loose or worn. Avoid having throw rugs at the top or bottom of the stairs. If you do have throw rugs, attach them to the floor with carpet tape. Make sure that you have a light switch at the top of the stairs and the bottom of the stairs. If  you do not have them, ask someone to add them for you. What else can I do to help prevent falls? Wear shoes that: Do not have high heels. Have rubber bottoms. Are comfortable and fit you well. Are closed at the toe. Do not wear sandals. If you use a stepladder: Make sure that it is fully opened. Do not climb a closed stepladder. Make sure that both sides of the stepladder are locked into place. Ask someone to hold it for you, if possible. Clearly mark and make sure that you can see: Any grab bars or handrails. First and last steps. Where the edge of each step is. Use tools that help you move around (mobility aids) if they are needed. These include: Canes. Walkers. Scooters. Crutches. Turn on the lights when you go into a dark area. Replace any light bulbs as soon as they burn out. Set up your furniture so you have a clear path. Avoid moving your furniture around. If any of your floors are uneven, fix them. If there are any pets around you, be aware of where they are. Review your medicines with your doctor. Some medicines can make you feel dizzy. This can increase your chance of falling. Ask your doctor what other things that you can do to help prevent falls. This information is not intended to replace advice given to you by your health care provider. Make sure you discuss any questions you have with your health care provider. Document Released: 12/31/2008 Document Revised: 08/12/2015 Document Reviewed: 04/10/2014 Elsevier Interactive Patient Education  2017 [employer].

## 2021-10-10 ENCOUNTER — Other Ambulatory Visit: Payer: Self-pay | Admitting: Primary Care

## 2021-10-10 DIAGNOSIS — Z1231 Encounter for screening mammogram for malignant neoplasm of breast: Secondary | ICD-10-CM

## 2021-10-14 ENCOUNTER — Telehealth: Payer: Medicare PPO | Admitting: Physician Assistant

## 2021-10-14 DIAGNOSIS — R3989 Other symptoms and signs involving the genitourinary system: Secondary | ICD-10-CM | POA: Diagnosis not present

## 2021-10-14 MED ORDER — NITROFURANTOIN MONOHYD MACRO 100 MG PO CAPS
100.0000 mg | ORAL_CAPSULE | Freq: Two times a day (BID) | ORAL | 0 refills | Status: DC
Start: 2021-10-14 — End: 2021-10-24

## 2021-10-14 NOTE — Progress Notes (Signed)
E-Visit for Urinary Problems  We are sorry that you are not feeling well.  Here is how we plan to help!  Based on what you shared with me it looks like you most likely have a simple urinary tract infection.  A UTI (Urinary Tract Infection) is a bacterial infection of the bladder.  Most cases of urinary tract infections are simple to treat but a key part of your care is to encourage you to drink plenty of fluids and watch your symptoms carefully.  I have prescribed MacroBid 100 mg twice a day for 5 days.  Your symptoms should gradually improve. Call us if the burning in your urine worsens, you develop worsening fever, back pain or pelvic pain or if your symptoms do not resolve after completing the antibiotic.  Urinary tract infections can be prevented by drinking plenty of water to keep your body hydrated.  Also be sure when you wipe, wipe from front to back and don't hold it in!  If possible, empty your bladder every 4 hours.  HOME CARE Drink plenty of fluids Compete the full course of the antibiotics even if the symptoms resolve Remember, when you need to go.go. Holding in your urine can increase the likelihood of getting a UTI! GET HELP RIGHT AWAY IF: You cannot urinate You get a high fever Worsening back pain occurs You see blood in your urine You feel sick to your stomach or throw up You feel like you are going to pass out  MAKE SURE YOU  Understand these instructions. Will watch your condition. Will get help right away if you are not doing well or get worse.   Thank you for choosing an e-visit.  Your e-visit answers were reviewed by a board certified advanced clinical practitioner to complete your personal care plan. Depending upon the condition, your plan could have included both over the counter or prescription medications.  Please review your pharmacy choice. Make sure the pharmacy is open so you can pick up prescription now. If there is a problem, you may contact your  provider through [employer] and have the prescription routed to another pharmacy.  Your safety is important to [place]. If you have drug allergies check your prescription carefully.   For the next 24 hours you can use MyChart to ask questions about today's visit, request a non-urgent call back, or ask for a work or school excuse. You will get an email in the next two days asking about your experience. I hope that your e-visit has been valuable and will speed your recovery.  I provided 5 minutes of non face-to-face time during this encounter for chart review and documentation.

## 2021-10-19 ENCOUNTER — Ambulatory Visit (HOSPITAL_COMMUNITY)
Admission: RE | Admit: 2021-10-19 | Discharge: 2021-10-19 | Disposition: A | Discharge status: Home / Self Care | Payer: Medicare PPO | Source: Ambulatory Visit | Attending: Gastroenterology | Admitting: Gastroenterology

## 2021-10-19 ENCOUNTER — Other Ambulatory Visit (HOSPITAL_COMMUNITY): Payer: Self-pay | Admitting: Gastroenterology

## 2021-10-19 DIAGNOSIS — R198 Other specified symptoms and signs involving the digestive system and abdomen: Secondary | ICD-10-CM | POA: Insufficient documentation

## 2021-10-19 DIAGNOSIS — K5900 Constipation, unspecified: Secondary | ICD-10-CM | POA: Diagnosis not present

## 2021-10-24 ENCOUNTER — Ambulatory Visit: Payer: Medicare PPO | Admitting: Nurse Practitioner

## 2021-10-24 VITALS — BP 114/76 | HR 96 | Temp 98.4°F | Resp 14

## 2021-10-24 DIAGNOSIS — T25022A Burn of unspecified degree of left foot, initial encounter: Secondary | ICD-10-CM

## 2021-10-24 DIAGNOSIS — N3091 Cystitis, unspecified with hematuria: Secondary | ICD-10-CM | POA: Diagnosis not present

## 2021-10-24 DIAGNOSIS — R350 Frequency of micturition: Secondary | ICD-10-CM

## 2021-10-24 DIAGNOSIS — T148XXA Other injury of unspecified body region, initial encounter: Secondary | ICD-10-CM | POA: Insufficient documentation

## 2021-10-24 DIAGNOSIS — R234 Changes in skin texture: Secondary | ICD-10-CM | POA: Diagnosis not present

## 2021-10-24 HISTORY — DX: Cystitis, unspecified with hematuria: N30.91

## 2021-10-24 LAB — POC URINALSYSI DIPSTICK (AUTOMATED)
Bilirubin, UA: NEGATIVE
Blood, UA: POSITIVE
Glucose, UA: POSITIVE — AB
Ketones, UA: POSITIVE
Nitrite, UA: NEGATIVE
Protein, UA: POSITIVE — AB
Spec Grav, UA: 1.015 (ref 1.010–1.025)
Urobilinogen, UA: 0.2 E.U./dL
pH, UA: 6 (ref 5.0–8.0)

## 2021-10-24 MED ORDER — NITROFURANTOIN MONOHYD MACRO 100 MG PO CAPS: 100.0000 mg | ORAL_CAPSULE | Freq: Two times a day (BID) | ORAL | 0 refills | Status: AC

## 2021-10-24 MED ORDER — SILVER SULFADIAZINE 1 % EX CREA: 1.0000 | TOPICAL_CREAM | Freq: Once | CUTANEOUS | Status: AC

## 2021-10-24 MED ORDER — SILVER SULFADIAZINE 1 % EX CREA: 1.0000 | TOPICAL_CREAM | Freq: Every day | CUTANEOUS | 0 refills | Status: DC

## 2021-10-24 NOTE — Progress Notes (Signed)
Acute Office Visit  Subjective:     Patient ID: [name], female    DOB: 05-13-1965, 56 y.o.   MRN: [id number]  Chief Complaint  Patient presents with   Hematuria    Blood in urine on 8/5 and 8/6, urgency to urinate, frequency, pressure to urinate. She had VV on 10/14/21 and took Macrobid and finished on 10/21/21   Skin Problem    Toes bleeding, skin break down, break down on top of left foot.     Patient is in today for   Hematuria: Noticed blood in her urine on Saturday. States that she does have some dysuria. States that she has taken azo. Last dose of azo was Saturday and she is having some urgency. Has increased out water intake. States that a week a go she did a virtual visit and was prescribed macrobid '100mg'$  bID with some resolution but not complete resoultion.  Patient denies personal history of nephrolithiasis does state that her mother has had them in the past though.  Feet: States that her right foot she scraped it on something in a vehicle getting in and out of it.  Does have several scabbed areas that seem to be healing Left foot. States that she had sunscreen on and may not have gotten enough [place]. States that she has a blister to the top distal portion of her foot.  Some blister intact some blister deroofed already.  Review of Systems  Constitutional:  Negative for chills and fever.  Gastrointestinal:  Positive for abdominal pain and nausea. Negative for vomiting.  Genitourinary:  Positive for dysuria, frequency, hematuria and urgency.        Objective:    BP 114/76   Pulse 96   Temp 98.4 F (36.9 C)   Resp 14   SpO2 93%    Physical Exam Vitals and nursing note reviewed.  Constitutional:      Appearance: Normal appearance. She is obese.  Cardiovascular:     Rate and Rhythm: Normal rate and regular rhythm.     Pulses: Normal pulses.     Heart sounds: Normal heart sounds.  Pulmonary:     Effort:  Pulmonary effort is normal.     Breath sounds: Normal breath sounds.  Abdominal:     General: Bowel sounds are normal. There is no distension.     Palpations: There is no mass.     Tenderness: There is no abdominal tenderness. There is no right CVA tenderness or left CVA tenderness.     Hernia: No hernia is present.  Skin:    General: Skin is warm.     Capillary Refill: Capillary refill takes less than 2 seconds.     Coloration: Skin is pale.     Findings: Lesion present. No bruising.  Neurological:     Mental Status: She is alert.        Results for orders placed or performed in visit on 10/24/21  POCT Urinalysis Dipstick (Automated)  Result Value Ref Range   Color, UA yellow    Clarity, UA cloudy    Glucose, UA Positive (A) Negative   Bilirubin, UA negative    Ketones, UA positive    Spec Grav, UA 1.015 1.010 - 1.025   Blood, UA positive-large    pH, UA 6.0 5.0 - 8.0   Protein, UA Positive (A) Negative   Urobilinogen, UA 0.2 0.2 or 1.0 E.U./dL   Nitrite,  UA negative    Leukocytes, UA Moderate (2+) (A) Negative        Assessment & Plan:   Problem List Items Addressed This Visit       Musculoskeletal and Integument   Scab    Multiple scabs to right lower foot.  Patient states that sometimes it does get caught when getting in and out of a vehicle and sometimes with the wheelchair itself.  2 scabs that were bleeding in office today.  Did clean them with normal saline and gauze applied bandages.  Patient does have Bactroban ointment at home she can use on the spots on the right foot only.  Follow-up if no improvement        Genitourinary   Cystitis with hematuria    Pending urine culture.  She was treated with Macrobid and started to improve given the patient's allergies we will again treat with Macrobid 100 mg twice daily for 5 days.  Send off for urine culture.  Patient is allergy to amoxicillin/penicillin and Levaquin.  Do not fill safe putting patient on  cephalosporin as she is allergic to both the other classes.  Did review the chart and do not see where she has been placed on a cephalosporin patient is unsure if she has or not.  Follow-up if no improvement      Relevant Medications   nitrofurantoin, macrocrystal-monohydrate, (MACROBID) 100 MG capsule     Other   Blister    Blisters to left distal dorsal surface of foot.  See clinical photo.  Patient does think is related [place].  Some are already deroofed did remove dead skin with scissors clean the area with normal saline and gauze.  Applied Silvadene cream in office wrapped with nonadherent and Kerlix dressing.  Asked patient several times if she is tolerated Silvadene before as she does have a sulfa allergy.  Patient did mention that she has tolerated it in the past and it works well for her.  We will send in a prescription for same have close follow-up in 1 month given patient's compromised status of feet and diabetes.  She has been seen at wound care in the past May have to refer back      Relevant Medications   silver sulfADIAZINE (SILVADENE) 1 % cream 1 Application   silver sulfADIAZINE (SILVADENE) 1 % cream   Urinary frequency - Primary    Patient experiencing frequency, urgency, and dysuria UA indicative of urinary tract infection      Relevant Orders   POCT Urinalysis Dipstick (Automated) (Completed)   Urine Culture    Meds ordered this encounter  Medications   silver sulfADIAZINE (SILVADENE) 1 % cream 1 Application   silver sulfADIAZINE (SILVADENE) 1 % cream    Sig: Apply 1 Application topically daily.    Dispense:  50 g    Refill:  0    Order Specific Question:   Supervising Provider    Answer:   TOWER, MARNE A [[id number]]   nitrofurantoin, macrocrystal-monohydrate, (MACROBID) 100 MG capsule    Sig: Take 1 capsule (100 mg total) by mouth 2 (two) times daily for 5 days.    Dispense:  10 capsule    Refill:  0    Order Specific  Question:   Supervising Provider    Answer:   Loura Pardon A [[id number]]    Return in about 1 week (around 10/31/2021) for With Anda Kraft if avaliable if not with me .  Romilda Garret, NP

## 2021-10-24 NOTE — Patient Instructions (Signed)
Nice to see you today Change the bandage daily. It is ok to let it breath if you are sitting in the chair at home. DO NO pop the blisters let them rupture on there own. Follow up in 1 week with Anda Kraft if available or me if she is not

## 2021-10-24 NOTE — Assessment & Plan Note (Signed)
Pending urine culture.  She was treated with Macrobid and started to improve given the patient's allergies we will again treat with Macrobid 100 mg twice daily for 5 days.  Send off for urine culture.  Patient is allergy to amoxicillin/penicillin and Levaquin.  Do not fill safe putting patient on cephalosporin as she is allergic to both the other classes.  Did review the chart and do not see where she has been placed on a cephalosporin patient is unsure if she has or not.  Follow-up if no improvement

## 2021-10-24 NOTE — Assessment & Plan Note (Signed)
Multiple scabs to right lower foot.  Patient states that sometimes it does get caught when getting in and out of a vehicle and sometimes with the wheelchair itself.  2 scabs that were bleeding in office today.  Did clean them with normal saline and gauze applied bandages.  Patient does have Bactroban ointment at home she can use on the spots on the right foot only.  Follow-up if no improvement

## 2021-10-24 NOTE — Assessment & Plan Note (Signed)
Patient experiencing frequency, urgency, and dysuria UA indicative of urinary tract infection

## 2021-10-24 NOTE — Assessment & Plan Note (Addendum)
Blisters to left distal dorsal surface of foot.  See clinical photo.  Patient does think is related [place]  Some are already deroofed did remove dead skin with scissors clean the area with normal saline and gauze.  Applied Silvadene cream in office wrapped with nonadherent and Kerlix dressing.  Asked patient several times if she is tolerated Silvadene before as she does have a sulfa allergy.  Patient did mention that she has tolerated it in the past and it works well for her.  We will send in a prescription for same have close follow-up in 1 month given patient's compromised status of feet and diabetes.  She has been seen at wound care in the past May have to refer back

## 2021-10-25 LAB — URINE CULTURE
MICRO NUMBER:: 13744757
SPECIMEN QUALITY:: ADEQUATE

## 2021-11-02 ENCOUNTER — Encounter: Payer: Medicare PPO | Attending: Physician Assistant | Admitting: Internal Medicine

## 2021-11-02 ENCOUNTER — Ambulatory Visit: Payer: Medicare PPO | Admitting: Primary Care

## 2021-11-02 DIAGNOSIS — T25222A Burn of second degree of left foot, initial encounter: Secondary | ICD-10-CM | POA: Insufficient documentation

## 2021-11-02 DIAGNOSIS — J45909 Unspecified asthma, uncomplicated: Secondary | ICD-10-CM | POA: Diagnosis not present

## 2021-11-02 DIAGNOSIS — E1151 Type 2 diabetes mellitus with diabetic peripheral angiopathy without gangrene: Secondary | ICD-10-CM | POA: Diagnosis not present

## 2021-11-02 DIAGNOSIS — T798XXA Other early complications of trauma, initial encounter: Secondary | ICD-10-CM | POA: Insufficient documentation

## 2021-11-02 DIAGNOSIS — Z6841 Body Mass Index (BMI) 40.0 and over, adult: Secondary | ICD-10-CM | POA: Insufficient documentation

## 2021-11-02 DIAGNOSIS — E11621 Type 2 diabetes mellitus with foot ulcer: Secondary | ICD-10-CM | POA: Diagnosis not present

## 2021-11-02 DIAGNOSIS — I10 Essential (primary) hypertension: Secondary | ICD-10-CM | POA: Diagnosis not present

## 2021-11-02 DIAGNOSIS — M1990 Unspecified osteoarthritis, unspecified site: Secondary | ICD-10-CM | POA: Insufficient documentation

## 2021-11-02 DIAGNOSIS — E1140 Type 2 diabetes mellitus with diabetic neuropathy, unspecified: Secondary | ICD-10-CM | POA: Insufficient documentation

## 2021-11-05 NOTE — Progress Notes (Signed)
[name], [name] ([id number]) Visit Report for 11/02/2021 Abuse Risk Screen Details Patient Name: [name], [name]. Date of Service: 11/02/2021 8:45 AM Medical Record Number: [id number] Patient Account Number: [id number] Date of Birth/Sex: 05-Nov-1965 (56 y.o. F) Treating RN: Carlene Coria Primary Care Crew Goren: Alma Friendly Other Clinician: Referring Calieb Lichtman: Alma Friendly Treating Jaidalyn Schillo/Extender: Yaakov Guthrie in Treatment: 0 Abuse Risk Screen Items Answer ABUSE RISK SCREEN: Has anyone close to you tried to hurt or harm you recentlyo No Do you feel uncomfortable with anyone in your familyo No Has anyone forced you do things that you didnot want to doo No Electronic Signature(s) Signed: 11/04/2021 6:13:50 PM By: Carlene Coria RN Entered By: Carlene Coria on 11/02/2021 08:57:20 [name], [name] ([id number]) -------------------------------------------------------------------------------- Activities of Daily Living Details Patient Name: Maria Cloud P. Date of Service: 11/02/2021 8:45 AM Medical Record Number: [id number] Patient Account Number: [id number] Date of Birth/Sex: [birth year] (56 y.o. F) Treating RN: Carlene Coria Primary Care Kenna Kirn: Alma Friendly Other Clinician: Referring Lasondra Hodgkins: Alma Friendly Treating Travonte Byard/Extender: Yaakov Guthrie in Treatment: 0 Activities of Daily Living Items Answer Activities of Daily Living (Please select one for each item) Drive Automobile Not Able Take Medications Completely Able Use Telephone Completely Able Care for Appearance Completely Able Use Toilet Need Assistance Bath / Shower Need Assistance Dress Self Need Assistance Feed Self Completely Able Walk Need Assistance Get In / Out Bed Need Assistance Housework Not Able Prepare Meals Not Able Handle Money Need Assistance Shop for Self Need Assistance Electronic Signature(s) Signed: 11/04/2021 6:13:50 PM By: Carlene Coria RN Entered By: Carlene Coria  on 11/02/2021 08:58:01 [name], [name] ([id number]) -------------------------------------------------------------------------------- Education Screening Details Patient Name: Maria Cloud P. Date of Service: 11/02/2021 8:45 AM Medical Record Number: [id number] Patient Account Number: [id number] Date of Birth/Sex: 12-01-1965 (56 y.o. F) Treating RN: Carlene Coria Primary Care Werner Labella: Alma Friendly Other Clinician: Referring Nickey Canedo: Alma Friendly Treating Leevon Upperman/Extender: Yaakov Guthrie in Treatment: 0 Primary Learner Assessed: Patient Learning Preferences/Education Level/Primary Language Learning Preference: Explanation Highest Education Level: College or Above Preferred Language: English Cognitive Barrier Language Barrier: No Translator Needed: No Memory Deficit: No Emotional Barrier: No Cultural/Religious Beliefs Affecting Medical Care: No Physical Barrier Impaired Vision: Yes Glasses Impaired Hearing: No Decreased Hand dexterity: No Knowledge/Comprehension Knowledge Level: Medium Comprehension Level: High Ability to understand written instructions: High Ability to understand verbal instructions: High Motivation Anxiety Level: Anxious Cooperation: Cooperative Education Importance: Acknowledges Need Interest in Health Problems: Asks Questions Perception: Coherent Willingness to Engage in Self-Management High Activities: Readiness to Engage in Self-Management High Activities: Electronic Signature(s) Signed: 11/04/2021 6:13:50 PM By: Carlene Coria RN Entered By: Carlene Coria on 11/02/2021 08:58:28 [name], [name] ([id number]) -------------------------------------------------------------------------------- Fall Risk Assessment Details Patient Name: Maria Cloud P. Date of Service: 11/02/2021 8:45 AM Medical Record Number: [id number] Patient Account Number: [id number] Date of Birth/Sex: 1965-12-07 (56 y.o. F) Treating RN: Carlene Coria Primary Care  Tashanda Fuhrer: Alma Friendly Other Clinician: Referring Juanette Urizar: Alma Friendly Treating Chauntelle Azpeitia/Extender: Yaakov Guthrie in Treatment: 0 Fall Risk Assessment Items Have you had 2 or more falls in the last 12 monthso 0 No Have you had any fall that resulted in injury in the last 12 monthso 0 No FALLS RISK SCREEN History of falling - immediate or within 3 months 0 No Secondary diagnosis (Do you have 2 or more medical diagnoseso) 0 No Ambulatory aid None/bed rest/wheelchair/nurse 0 No Crutches/cane/walker 0 No Furniture 0 No Intravenous therapy Access/Saline/Heparin Lock 0 No Gait/Transferring Normal/ bed rest/ wheelchair 0 No Weak (short steps  with or without shuffle, stooped but able to lift head while walking, may 0 No seek support from furniture) Impaired (short steps with shuffle, may have difficulty arising from chair, head down, impaired 0 No balance) Mental Status Oriented to own ability 0 No Electronic Signature(s) Signed: 11/04/2021 6:13:50 PM By: Carlene Coria RN Entered By: Carlene Coria on 11/02/2021 08:58:34 [name], [name] ([id number]) -------------------------------------------------------------------------------- Foot Assessment Details Patient Name: Maria Cloud P. Date of Service: 11/02/2021 8:45 AM Medical Record Number: [id number] Patient Account Number: [id number] Date of Birth/Sex: [birth year] (56 y.o. F) Treating RN: Carlene Coria Primary Care Gabriella Woodhead: Alma Friendly Other Clinician: Referring Libby Goehring: Alma Friendly Treating Adren Dollins/Extender: Yaakov Guthrie in Treatment: 0 Foot Assessment Items Site Locations + = Sensation present, - = Sensation absent, C = Callus, U = Ulcer R = Redness, W = Warmth, M = Maceration, PU = Pre-ulcerative lesion F = Fissure, S = Swelling, D = Dryness Assessment Right: Left: Other Deformity: No No Prior Foot Ulcer: No No Prior Amputation: No No Charcot Joint: No No Ambulatory Status: Ambulatory  Without Help Gait: Steady Electronic Signature(s) Signed: 11/04/2021 6:13:50 PM By: Carlene Coria RN Entered By: Carlene Coria on 11/02/2021 09:01:46 [name], [name] ([id number]) -------------------------------------------------------------------------------- Nutrition Risk Screening Details Patient Name: Maria Cloud P. Date of Service: 11/02/2021 8:45 AM Medical Record Number: [id number] Patient Account Number: [id number] Date of Birth/Sex: 31-Aug-1965 (56 y.o. F) Treating RN: Carlene Coria Primary Care Jaisha Villacres: Alma Friendly Other Clinician: Referring Shellsea Borunda: Alma Friendly Treating Maryjean Corpening/Extender: Yaakov Guthrie in Treatment: 0 Height (in): 67 Weight (lbs): 311 Body Mass Index (BMI): 48.7 Nutrition Risk Screening Items Score Screening NUTRITION RISK SCREEN: I have an illness or condition that made me change the kind and/or amount of food I eat 0 No I eat fewer than two meals per day 0 No I eat few fruits and vegetables, or milk products 0 No I have three or more drinks of beer, liquor or wine almost every day 0 No I have tooth or mouth problems that make it hard for me to eat 0 No I don't always have enough money to buy the food I need 0 No I eat alone most of the time 0 No I take three or more different prescribed or over-the-counter drugs a day 1 Yes Without wanting to, I have lost or gained 10 pounds in the last six months 0 No I am not always physically able to shop, cook and/or feed myself 2 Yes Nutrition Protocols Good Risk Protocol Moderate Risk Protocol 0 Provide education on nutrition High Risk Proctocol Risk Level: Moderate Risk Score: 3 Electronic Signature(s) Signed: 11/04/2021 6:13:50 PM By: Carlene Coria RN Entered By: Carlene Coria on 11/02/2021 08:58:44

## 2021-11-05 NOTE — Progress Notes (Signed)
SUNSHINE, MACKOWSKI ([id number]) Visit Report for 11/02/2021 Allergy List Details Patient Name: [name], [name]. Date of Service: 11/02/2021 8:45 AM Medical Record Number: [id number] Patient Account Number: [id number] Date of Birth/Sex: 1965-09-14 (56 y.o. F) Treating RN: Carlene Coria Primary Care Arminda Foglio: Alma Friendly Other Clinician: Referring Amori Cooperman: Alma Friendly Treating Efstathios Sawin/Extender: Yaakov Guthrie in Treatment: 0 Allergies Active Allergies penicillin Severity: Moderate amoxicillin Severity: Moderate vancomycin Severity: Moderate Sulfa (Sulfonamide Antibiotics) Severity: Moderate levofloxacin Severity: Moderate bee sting kit meloxicam ACE Inhibitors Demerol Tylox codeine Percocet bee venom protein (honey bee) Tequin Levaquin meperidine Allergy Notes Electronic Signature(s) Signed: 11/04/2021 6:13:50 PM By: Carlene Coria RN Entered By: Carlene Coria on 11/02/2021 08:55:29 [name], [name] ([id number]) -------------------------------------------------------------------------------- Arrival Information Details Patient Name: Maria Cloud P. Date of Service: 11/02/2021 8:45 AM Medical Record Number: [id number] Patient Account Number: [id number] Date of Birth/Sex: 25-Apr-1965 (56 y.o. F) Treating RN: Carlene Coria Primary Care Allyson Tineo: Alma Friendly Other Clinician: Referring Japheth Diekman: Alma Friendly Treating Finnley Larusso/Extender: Yaakov Guthrie in Treatment: 0 Visit Information Patient Arrived: Wheel Chair Arrival Time: 08:42 Accompanied By: caregiver Transfer Assistance: None Patient Identification Verified: Yes Secondary Verification Process Completed: Yes Patient Requires Transmission-Based Precautions: No Patient Has Alerts: No History Since Last Visit All ordered tests and consults were completed: No Added or deleted any medications: No Any new allergies or adverse reactions: No Had a fall or experienced change in activities of  daily living that may affect risk of falls: No Signs or symptoms of abuse/neglect since last visito No Hospitalized since last visit: No Implantable device outside of the clinic excluding cellular tissue based products placed in the center since last visit: No Electronic Signature(s) Signed: 11/04/2021 6:13:50 PM By: Carlene Coria RN Entered By: Carlene Coria on 11/02/2021 08:48:58 [name], [name] ([id number]) -------------------------------------------------------------------------------- Clinic Level of Care Assessment Details Patient Name: Maria Cloud P. Date of Service: 11/02/2021 8:45 AM Medical Record Number: [id number] Patient Account Number: [id number] Date of Birth/Sex: [birth year] (56 y.o. F) Treating RN: Carlene Coria Primary Care Hollan Philipp: Alma Friendly Other Clinician: Referring Maalle Starrett: Alma Friendly Treating Aftan Vint/Extender: Yaakov Guthrie in Treatment: 0 Clinic Level of Care Assessment Items TOOL 1 Quantity Score X - Use when EandM and Procedure is performed on INITIAL visit 1 0 ASSESSMENTS - Nursing Assessment / Reassessment X - General Physical Exam (combine w/ comprehensive assessment (listed just below) when performed on new 1 20 pt. evals) X- 1 25 Comprehensive Assessment (HX, ROS, Risk Assessments, Wounds Hx, etc.) ASSESSMENTS - Wound and Skin Assessment / Reassessment _0  - Dermatologic / Skin Assessment (not related to wound area) 0 ASSESSMENTS - Ostomy and/or Continence Assessment and Care _1  - Incontinence Assessment and Management 0 _2  - 0 Ostomy Care Assessment and Management (repouching, etc.) PROCESS - Coordination of Care X - Simple Patient / Family Education for ongoing care 1 15 _3  - 0 Complex (extensive) Patient / Family Education for ongoing care X- 1 10 Staff obtains Programmer, systems, Records, Test Results / Process Orders _4  - 0 Staff telephones HHA, Nursing Homes / Clarify orders / etc _5  - 0 Routine Transfer to another Facility  (non-emergent condition) _6  - 0 Routine Hospital Admission (non-emergent condition) X- 1 15 New Admissions / Biomedical engineer / Ordering NPWT, Apligraf, etc. _7  - 0 Emergency Hospital Admission (emergent condition) PROCESS - Special Needs _8  - Pediatric / Minor Patient Management 0 _9  - 0 Isolation Patient Management _10  - 0 Hearing / Language / Visual special needs _11  - 0 Assessment of Community assistance (transportation, D/C planning,  etc.) _0  - 0 Additional assistance / Altered mentation _1  - 0 Support Surface(s) Assessment (bed, cushion, seat, etc.) INTERVENTIONS - Miscellaneous _2  - External ear exam 0 _3  - 0 Patient Transfer (multiple staff / Civil Service fast streamer / Similar devices) _4  - 0 Simple Staple / Suture removal (25 or less) _5  - 0 Complex Staple / Suture removal (26 or more) _6  - 0 Hypo/Hyperglycemic Management (do not check if billed separately) _7  - 0 Ankle / Brachial Index (ABI) - do not check if billed separately Has the patient been seen at the hospital within the last three years: Yes Total Score: 85 Level Of Care: New/Established - Level 3 [name], [name] ([id number]) Electronic Signature(s) Signed: 11/04/2021 6:13:50 PM By: Carlene Coria RN Entered By: Carlene Coria on 11/02/2021 93:81:01 [name] ([id number]) -------------------------------------------------------------------------------- Encounter Discharge Information Details Patient Name: Maria Cloud P. Date of Service: 11/02/2021 8:45 AM Medical Record Number: [id number] Patient Account Number: [id number] Date of Birth/Sex: [birth year] (56 y.o. F) Treating RN: Carlene Coria Primary Care Jonique Kulig: Alma Friendly Other Clinician: Referring Aleiah Mohammed: Alma Friendly Treating Hetal Proano/Extender: Yaakov Guthrie in Treatment: 0 Encounter Discharge Information Items Post Procedure Vitals Discharge Condition: Stable Temperature (F): 97.9 Ambulatory Status: Wheelchair Pulse (bpm):  111 Discharge Destination: Home Respiratory Rate (breaths/min): 18 Transportation: Private Auto Blood Pressure (mmHg): 177/94 Accompanied By: daughter Schedule Follow-up Appointment: Yes Clinical Summary of Care: Patient Declined Electronic Signature(s) Signed: 11/04/2021 6:13:50 PM By: Carlene Coria RN Entered By: Carlene Coria on 11/02/2021 09:30:25 [name], [name] ([id number]) -------------------------------------------------------------------------------- Lower Extremity Assessment Details Patient Name: Maria Cloud P. Date of Service: 11/02/2021 8:45 AM Medical Record Number: [id number] Patient Account Number: [id number] Date of Birth/Sex: 11/11/1965 (56 y.o. F) Treating RN: Carlene Coria Primary Care Claxton Levitz: Alma Friendly Other Clinician: Referring Eyad Rochford: Alma Friendly Treating Hampton Wixom/Extender: Yaakov Guthrie in Treatment: 0 Edema Assessment Assessed: [Left: No] [Right: No] Edema: [Left: Ye] [Right: s] Calf Left: Right: Point of Measurement: 30 cm From Medial Instep 50 cm Ankle Left: Right: Point of Measurement: 10 cm From Medial Instep 27 cm Knee To Floor Left: Right: From Medial Instep 43 cm Vascular Assessment Pulses: Dorsalis Pedis Palpable: [Left:Yes] Electronic Signature(s) Signed: 11/04/2021 6:13:50 PM By: Carlene Coria RN Entered By: Carlene Coria on 11/02/2021 09:11:32 [name], [name] ([id number]) -------------------------------------------------------------------------------- Multi Wound Chart Details Patient Name: Maria Cloud P. Date of Service: 11/02/2021 8:45 AM Medical Record Number: [id number] Patient Account Number: [id number] Date of Birth/Sex: [birth year] (56 y.o. F) Treating RN: Carlene Coria Primary Care Nur Rabold: Alma Friendly Other Clinician: Referring Matson Welch: Alma Friendly Treating Clevester Helzer/Extender: Yaakov Guthrie in Treatment: 0 Vital Signs Height(in): 67 Pulse(bpm): 111 Weight(lbs): 311 Blood  Pressure(mmHg): 177/94 Body Mass Index(BMI): 48.7 Temperature(F): 97.9 Respiratory Rate(breaths/min): 18 Photos: [N/A:N/A] Wound Location: Left, Anterior Foot N/A N/A Wounding Event: Thermal Burn N/A N/A Primary Etiology: 2nd degree Burn N/A N/A Comorbid History: Lymphedema, Asthma, Sleep N/A N/A Apnea, Hypertension, Colitis, Type II Diabetes, Osteoarthritis, Neuropathy Date Acquired: 10/29/2021 N/A N/A Weeks of Treatment: 0 N/A N/A Wound Status: Open N/A N/A Wound Recurrence: No N/A N/A Clustered Wound: Yes N/A N/A Measurements L x W x D (cm) 7x8x0.2 N/A N/A Area (cm) : 43.982 N/A N/A Volume (cm) : 8.796 N/A N/A Classification: Full Thickness Without Exposed N/A N/A Support Structures Exudate Amount: Medium N/A N/A Exudate Type: Serosanguineous N/A N/A Exudate Color: red, brown N/A N/A Granulation Amount: Small (1-33%) N/A N/A Necrotic Amount: Large (67-100%) N/A N/A Necrotic Tissue: Eschar, Adherent Slough N/A N/A Exposed Structures: Fat Layer (Subcutaneous Tissue):  N/A N/A Yes Fascia: No Tendon: No Muscle: No Joint: No Bone: No Epithelialization: None N/A N/A Procedures Performed: Burn Debridement: Small N/A N/A Treatment Notes Wound #2 (Foot) Wound Laterality: Left, Anterior Cleanser [place]ram Ancillary Kit - 15 Day Supply Discharge Instruction: Use supplies as instructed; Kit contains: (15) Saline Bullets; (15) 3x3 Gauze; 15 pr Gloves Soap and Water [name], [name] ([id number]) Discharge Instruction: Gently cleanse wound with antibacterial soap, rinse and pat dry prior to dressing wounds Wound Cleanser Discharge Instruction: Wash your hands with soap and water. Remove old dressing, discard into plastic bag and place into trash. Cleanse the wound with Wound Cleanser prior to applying a clean dressing using gauze sponges, not tissues or cotton balls. Do not scrub or use excessive force. Pat dry using gauze sponges, not tissue or cotton balls. Peri-Wound  Care Topical Primary Dressing Santyl Collagenase Ointment, 30 (gm), tube Discharge Instruction: to slough tissue Xeroform 5x9-HBD (in/in) Discharge Instruction: Apply Xeroform 5x9-HBD (in/in) as directed Secondary Dressing Kerlix 4.5 x 4.1 (in/yd) Discharge Instruction: Apply Kerlix 4.5 x 4.1 (in/yd) as instructed Secured With Saline H Soft Cloth Surgical Tape, 2x2 (in/yd) Compression Wrap Compression Stockings Add-Ons Electronic Signature(s) Signed: 11/02/2021 9:55:38 AM By: [name] DO Entered By: [name] on 11/02/2021 09:46:41 [name], [name] ([id number]) -------------------------------------------------------------------------------- Multi-Disciplinary Care Plan Details Patient Name: Maria Cloud P. Date of Service: 11/02/2021 8:45 AM Medical Record Number: [id number] Patient Account Number: [id number] Date of Birth/Sex: [birth year] (56 y.o. F) Treating RN: Carlene Coria Primary Care Anacristina Steffek: Alma Friendly Other Clinician: Referring Charleene Callegari: Alma Friendly Treating Krystena Reitter/Extender: Yaakov Guthrie in Treatment: 0 Active Inactive Abuse / Safety / Falls / Self Care Management Nursing Diagnoses: Potential for falls Goals: Patient/caregiver will verbalize understanding of skin care regimen Date Initiated: 11/02/2021 Target Resolution Date: 12/03/2021 Goal Status: Active Interventions: Assess Activities of Daily Living upon admission and as needed Assess fall risk on admission and as needed Assess: immobility, friction, shearing, incontinence upon admission and as needed Assess impairment of mobility on admission and as needed per policy Assess personal safety and home safety (as indicated) on admission and as needed Assess self care needs on admission and as needed Notes: Wound/Skin Impairment Nursing Diagnoses: Knowledge deficit related to ulceration/compromised skin integrity Goals: Patient/caregiver will verbalize  understanding of skin care regimen Date Initiated: 11/02/2021 Target Resolution Date: 12/03/2021 Goal Status: Active Ulcer/skin breakdown will have a volume reduction of 30% by week 4 Date Initiated: 11/02/2021 Target Resolution Date: 01/02/2022 Goal Status: Active Ulcer/skin breakdown will have a volume reduction of 50% by week 8 Date Initiated: 11/02/2021 Target Resolution Date: 02/02/2022 Goal Status: Active Ulcer/skin breakdown will have a volume reduction of 80% by week 12 Date Initiated: 11/02/2021 Target Resolution Date: 03/04/2022 Goal Status: Active Ulcer/skin breakdown will heal within 14 weeks Date Initiated: 11/02/2021 Target Resolution Date: 04/04/2022 Goal Status: Active Interventions: Assess patient/caregiver ability to obtain necessary supplies Assess patient/caregiver ability to perform ulcer/skin care regimen upon admission and as needed Assess ulceration(s) every visit Notes: Electronic Signature(s) Signed: 11/04/2021 6:13:50 PM By: Carlene Coria RN Entered By: Carlene Coria on 11/02/2021 09:24:42 [name] ([id number]) [name], [name] ([id number]) -------------------------------------------------------------------------------- Pain Assessment Details Patient Name: Maria Cloud P. Date of Service: 11/02/2021 8:45 AM Medical Record Number: [id number] Patient Account Number: [id number] Date of Birth/Sex: [birth year] (56 y.o. F) Treating RN: Carlene Coria Primary Care Chaunta Bejarano: Alma Friendly Other Clinician: Referring Virgilene Stryker: Alma Friendly Treating Tata Timmins/Extender: Yaakov Guthrie in Treatment: 0 Active Problems Location of Pain  Severity and Description of Pain Patient Has Paino Yes Site Locations With Dressing Change: Yes Duration of the Pain. Constant / Intermittento Constant Rate the pain. Current Pain Level: 3 Worst Pain Level: 8 Least Pain Level: 3 Character of Pain Describe the Pain: Aching, Dull, Throbbing Pain Management and  Medication Current Pain Management: Medication: Yes Cold Application: No Rest: Yes Massage: No Activity: No T.E.N.S.: No Heat Application: No Leg drop or elevation: No Is the Current Pain Management Adequate: Inadequate How does your wound impact your activities of daily livingo Sleep: Yes Bathing: No Appetite: No Relationship With Others: No Bladder Continence: No Emotions: No Bowel Continence: No Work: No Toileting: No Drive: No Dressing: No Hobbies: No Electronic Signature(s) Signed: 11/04/2021 6:13:50 PM By: Carlene Coria RN Entered By: Carlene Coria on 11/02/2021 08:51:53 [name], [name] ([id number]) -------------------------------------------------------------------------------- Patient/Caregiver Education Details Patient Name: Maria Cloud P. Date of Service: 11/02/2021 8:45 AM Medical Record Number: [id number] Patient Account Number: [id number] Date of Birth/Gender: [birth year] (56 y.o. F) Treating RN: Carlene Coria Primary Care Physician: Alma Friendly Other Clinician: Referring Physician: Alma Friendly Treating Physician/Extender: Yaakov Guthrie in Treatment: 0 Education Assessment Education Provided To: Patient Education Topics Provided Wound/Skin Impairment: Methods: Explain/Verbal Responses: State content correctly Electronic Signature(s) Signed: 11/04/2021 6:13:50 PM By: Carlene Coria RN Entered By: Carlene Coria on 11/02/2021 09:29:37 [name], [name] Roan ([id number]) -------------------------------------------------------------------------------- Wound Assessment Details Patient Name: Maria Cloud P. Date of Service: 11/02/2021 8:45 AM Medical Record Number: [id number] Patient Account Number: [id number] Date of Birth/Sex: 04/13/1965 (56 y.o. F) Treating RN: Carlene Coria Primary Care Cortnee Steinmiller: Alma Friendly Other Clinician: Referring Edger Husain: Alma Friendly Treating Mikah Rottinghaus/Extender: Yaakov Guthrie in Treatment: 0 Wound  Status Wound Number: 2 Primary 2nd degree Burn Etiology: Wound Location: Left, Anterior Foot Wound Open Wounding Event: Thermal Burn Status: Date Acquired: 10/29/2021 Comorbid Lymphedema, Asthma, Sleep Apnea, Hypertension, Weeks Of Treatment: 0 History: Colitis, Type II Diabetes, Osteoarthritis, Neuropathy Clustered Wound: Yes Photos Wound Measurements Length: (cm) 7 Width: (cm) 8 Depth: (cm) 0.2 Area: (cm) 43.982 Volume: (cm) 8.796 % Reduction in Area: % Reduction in Volume: Epithelialization: None Tunneling: No Undermining: No Wound Description Classification: Full Thickness Without Exposed Support Structures Exudate Amount: Medium Exudate Type: Serosanguineous Exudate Color: red, brown Foul Odor After Cleansing: No Slough/Fibrino Yes Wound Bed Granulation Amount: Small (1-33%) Exposed Structure Necrotic Amount: Large (67-100%) Fascia Exposed: No Necrotic Quality: Eschar, Adherent Slough Fat Layer (Subcutaneous Tissue) Exposed: Yes Tendon Exposed: No Muscle Exposed: No Joint Exposed: No Bone Exposed: No Treatment Notes Wound #2 (Foot) Wound Laterality: Left, Anterior Cleanser Byram Ancillary Kit - 15 Day Supply Discharge Instruction: Use supplies as instructed; Kit contains: (15) Saline Bullets; (15) 3x3 Gauze; 15 pr Gloves Soap and Water Discharge Instruction: Gently cleanse wound with antibacterial soap, rinse and pat dry prior to dressing wounds Cruces, Kandyce P. ([id number]) Wound Cleanser Discharge Instruction: Wash your hands with soap and water. Remove old dressing, discard into plastic bag and place into trash. Cleanse the wound with Wound Cleanser prior to applying a clean dressing using gauze sponges, not tissues or cotton balls. Do not scrub or use excessive force. Pat dry using gauze sponges, not tissue or cotton balls. Peri-Wound Care Topical Primary Dressing Santyl Collagenase Ointment, 30 (gm), tube Discharge Instruction: to slough  tissue Xeroform 5x9-HBD (in/in) Discharge Instruction: Apply Xeroform 5x9-HBD (in/in) as directed Secondary Dressing Kerlix 4.5 x 4.1 (in/yd) Discharge Instruction: Apply Kerlix 4.5 x 4.1 (in/yd) as instructed Secured With [place] H Soft Cloth Surgical  Tape, 2x2 (in/yd) Compression Wrap Compression Stockings Add-Ons Electronic Signature(s) Signed: 11/04/2021 6:13:50 PM By: Carlene Coria RN Entered By: Carlene Coria on 11/02/2021 09:07:13 Otoole, Camani P. ([id number]) -------------------------------------------------------------------------------- Vitals Details Patient Name: Maria Cloud P. Date of Service: 11/02/2021 8:45 AM Medical Record Number: [id number] Patient Account Number: [id number] Date of Birth/Sex: 1965/05/18 (56 y.o. F) Treating RN: Carlene Coria Primary Care Savon Cobbs: Alma Friendly Other Clinician: Referring Lindy Garczynski: Alma Friendly Treating Kenyata Guess/Extender: Yaakov Guthrie in Treatment: 0 Vital Signs Time Taken: 08:51 Temperature (F): 97.9 Height (in): 67 Pulse (bpm): 111 Source: Stated Respiratory Rate (breaths/min): 18 Weight (lbs): 311 Blood Pressure (mmHg): 177/94 Source: Stated Reference Range: 80 - 120 mg / dl Body Mass Index (BMI): 48.7 Electronic Signature(s) Signed: 11/04/2021 6:13:50 PM By: Carlene Coria RN Entered By: Carlene Coria on 11/02/2021 08:52:22

## 2021-11-05 NOTE — Progress Notes (Signed)
[name] ([id number]) Visit Report for 11/02/2021 Chief Complaint Document Details Patient Name: [name], [name]. Date of Service: 11/02/2021 8:45 AM Medical Record Number: [id number] Patient Account Number: [id number] Date of Birth/Sex: [birth year] (56 y.o. F) Treating RN: Carlene Coria Primary Care Provider: Alma Friendly Other Clinician: Referring Provider: Alma Friendly Treating Provider/Extender: Yaakov Guthrie in Treatment: 0 Information Obtained from: Patient Chief Complaint 06/06/2021; Left 2nd toe ulcer 11/02/2021; Sunburn to the left foot Electronic Signature(s) Signed: 11/02/2021 9:55:38 AM By: [name] DO Entered By: [name] on 11/02/2021 09:47:23 Shaker, Raenah P. ([id number]) -------------------------------------------------------------------------------- HPI Details Patient Name: Maria Cloud P. Date of Service: 11/02/2021 8:45 AM Medical Record Number: [id number] Patient Account Number: [id number] Date of Birth/Sex: 1965/05/14 (56 y.o. F) Treating RN: Carlene Coria Primary Care Provider: Alma Friendly Other Clinician: Referring Provider: Alma Friendly Treating Provider/Extender: Yaakov Guthrie in Treatment: 0 History of Present Illness HPI Description: 06/06/2021 upon evaluation patient appears to be doing pretty well all things considered with regard to the wound on her left second toe. This is on the proximal interphalangeal joint. Subsequently she tells me that this is something that she noted for somewhere around mid January as best she can remember. Currently she has been using mupirocin ointment and at times soaking this with Epsom salt but overall has not had any true treatment of the area. She does note having a hemoglobin A1c of 5.25 February 2021 although this is in the [facility] I could not find that directly but that was what she reported back to [place]. Overall I am very pleased with where things stand though I  think she could potentially benefit from Santyl to try to clean up the last little bit of necrotic tissue of the central portion of the wound to allow this to heal more effectively. Patient does have a history of hypertension, diabetes mellitus type 2, and again this appears to have been a pressure injury initially although right now she is just using what she called yoga socks with a grip on the bottom which I think is perfect. 06/14/2021 upon evaluation today patient appears to be doing well with regard to her toe and actually very pleased with where things stand and I think she is making excellent progress here. Admission 11/02/2021 [name] is a 56 year old female with a past medical history of controlled type 2 diabetes on oral agents and insulin and morbid obesity that presents to the clinic for a 1-2-week history of some burn to the left foot. She has developed blistering and subsequently open wounds. [place] however her feet were likely exposed to the sun. She has been using Silvadene to the area. She currently denies signs of infection. Electronic Signature(s) Signed: 11/02/2021 9:55:38 AM By: [name] DO Entered By: [name] on 11/02/2021 09:49:52 [name] ([id number]) -------------------------------------------------------------------------------- Burn Debridement: Small Details Patient Name: Maria Cloud P. Date of Service: 11/02/2021 8:45 AM Medical Record Number: [id number] Patient Account Number: [id number] Date of Birth/Sex: 20-May-1965 (56 y.o. F) Treating RN: Carlene Coria Primary Care Provider: Alma Friendly Other Clinician: Referring Provider: Alma Friendly Treating Provider/Extender: Yaakov Guthrie in Treatment: 0 Procedure Performed for: Wound #2 [place] Performed By: Physician [name], MD Post Procedure Diagnosis Same as Pre-procedure Notes Debridement  Performed for Assessment: Wound #2 Left,Anterior Foot Performed by: Physician Clinician [name] Debridement Type: Debridement Level of Consciousness pre-procedure: Awake and Alert Pre-procedure Verification/Time-Out Taken: Yes 09:20  Start Time: 09:20 Pain Control: Area based upon Length x Width = 56 (cmo) Area Debrided: Length: (cm) 4 X Width: (cm) 4 = Total Surface Area Debrided: (cmo) 16 Tissue and other material debrided: Viable Non-Viable Biofilm Blood Clots Bone Callus Cartilage Eschar Fascia Fat Fibrin/Exudate Hyper-granulation Joint Capsule Ligament Muscle Subcutaneous Skin: Dermis Skin: Epidermis Slough Tendon Other Level: Skin/Subcutaneous Tissue Debridement Description: Excisional Instrument: Blade Curette Forceps Nippers Rongeur Scissors Other Specimen: Swab Tissue Culture None Bleeding: Minimum Hemostasis Achieved: Pressure End Time: 09:29 Procedural Pain: 0 Post Procedural Pain: 0 Response to Treatment: Procedure was tolerated well Level of Consciousness post-procedure: Awake and Alert Open Post Debridement Measurements of Total Wound [name], [name] ([id number]) Length: (cm) 7 Width: (cm) 8 Depth: (cm) 0.2 Volume: (cmo) 8.796 Character of Wound/Ulcer Post Debridement: Improved Requires Further Debridement Stable Reference values from 11/02/2021 Wound Assessment Length: (cm) 7 Width: (cm) 8 Depth: (cm) 0.2 Area:(cmo) 43.982 Volume:(cmo) 8.796 oImport Existing Debridement Details Import No Thanks Post Procedure Diagnosis Same as Pre Procedure Post Procedure Diagnosis - not same as Pre-procedure Close Notes Electronic Signature(s) Signed: 11/04/2021 6:13:50 PM By: Carlene Coria RN Entered By: Carlene Coria on 11/02/2021 09:33:28 [name] ([id number]) -------------------------------------------------------------------------------- Physical Exam Details Patient Name: Maria Cloud P. Date of Service: 11/02/2021  8:45 AM Medical Record Number: [id number] Patient Account Number: [id number] Date of Birth/Sex: 1965/11/16 (56 y.o. F) Treating RN: Carlene Coria Primary Care Provider: Alma Friendly Other Clinician: Referring Provider: Alma Friendly Treating Provider/Extender: Yaakov Guthrie in Treatment: 0 Constitutional . Cardiovascular . Psychiatric . Notes Left foot: Blistering and open wounds with granulation tissue, devitalized tissue and nonviable tissue to the dorsal aspect. Blanching noted. No surrounding soft tissue infection including increased warmth, erythema or purulent drainage. Electronic Signature(s) Signed: 11/02/2021 9:55:38 AM By: [name] DO Entered By: [name] on 11/02/2021 09:53:57 Siebers, [name] ([id number]) -------------------------------------------------------------------------------- Physician Orders Details Patient Name: Maria Cloud P. Date of Service: 11/02/2021 8:45 AM Medical Record Number: [id number] Patient Account Number: [id number] Date of Birth/Sex: 1965/08/01 (56 y.o. F) Treating RN: Carlene Coria Primary Care Provider: Alma Friendly Other Clinician: Referring Provider: Alma Friendly Treating Provider/Extender: Yaakov Guthrie in Treatment: 0 Verbal / Phone Orders: No Diagnosis Coding ICD-10 Coding Code Description T25.222A Burn of second degree of left foot, initial encounter E11.621 Type 2 diabetes mellitus with foot ulcer Follow-up Appointments o Return Appointment in 1 week. Bathing/ Shower/ Hygiene o Wash wounds with antibacterial soap and water. Anesthetic (Use 'Patient Medications' Section for Anesthetic Order Entry) o Lidocaine applied to wound bed Wound Treatment Wound #2 - Foot Wound Laterality: Left, Anterior Cleanser: Byram Ancillary Kit - 15 Day Supply (DME) (Generic) 1 x Per Day/30 Days Discharge Instructions: Use supplies as instructed; Kit contains: (15) Saline Bullets; (15) 3x3 Gauze; 15  pr Gloves Cleanser: Soap and Water 1 x Per Day/30 Days Discharge Instructions: Gently cleanse wound with antibacterial soap, rinse and pat dry prior to dressing wounds Cleanser: Wound Cleanser 1 x Per Day/30 Days Discharge Instructions: Wash your hands with soap and water. Remove old dressing, discard into plastic bag and place into trash. Cleanse the wound with Wound Cleanser prior to applying a clean dressing using gauze sponges, not tissues or cotton balls. Do not scrub or use excessive force. Pat dry using gauze sponges, not tissue or cotton balls. Primary Dressing: Santyl Collagenase Ointment, 30 (gm), tube 1 x Per Day/30 Days Discharge Instructions: to slough tissue Primary Dressing: Xeroform 5x9-HBD (in/in) (DME) (Generic) 1 x Per Day/30 Days Discharge Instructions:  Apply Xeroform 5x9-HBD (in/in) as directed Secondary Dressing: Kerlix 4.5 x 4.1 (in/yd) (DME) (Generic) 1 x Per Day/30 Days Discharge Instructions: Apply Kerlix 4.5 x 4.1 (in/yd) as instructed Secured With: Medipore Tape - 79M Medipore H Soft Cloth Surgical Tape, 2x2 (in/yd) (DME) (Generic) 1 x Per Day/30 Days Electronic Signature(s) Signed: 11/02/2021 9:55:38 AM By: [name] DO Entered By: [name] on 11/02/2021 09:55:17 Gaus, [name] ([id number]) -------------------------------------------------------------------------------- Problem List Details Patient Name: Maria Cloud P. Date of Service: 11/02/2021 8:45 AM Medical Record Number: [id number] Patient Account Number: [id number] Date of Birth/Sex: 10/10/1965 (56 y.o. F) Treating RN: Carlene Coria Primary Care Provider: Alma Friendly Other Clinician: Referring Provider: Alma Friendly Treating Provider/Extender: Yaakov Guthrie in Treatment: 0 Active Problems ICD-10 Encounter Code Description Active Date MDM Diagnosis T25.222A Burn of second degree of left foot, initial encounter 11/02/2021 No Yes E11.621 Type 2 diabetes mellitus with  foot ulcer 11/02/2021 No Yes Inactive Problems Resolved Problems Electronic Signature(s) Signed: 11/02/2021 9:55:38 AM By: [name] DO Entered By: [name] on 11/02/2021 09:46:35 Foskey, Jazzmyn P. ([id number]) -------------------------------------------------------------------------------- Progress Note Details Patient Name: Maria Cloud P. Date of Service: 11/02/2021 8:45 AM Medical Record Number: [id number] Patient Account Number: [id number] Date of Birth/Sex: [birth year] (57 y.o. F) Treating RN: Carlene Coria Primary Care Provider: Alma Friendly Other Clinician: Referring Provider: Alma Friendly Treating Provider/Extender: Yaakov Guthrie in Treatment: 0 Subjective Chief Complaint Information obtained from Patient 06/06/2021; Left 2nd toe ulcer 11/02/2021; Sunburn to the left foot History of Present Illness (HPI) 06/06/2021 upon evaluation patient appears to be doing pretty well all things considered with regard to the wound on her left second toe. This is on the proximal interphalangeal joint. Subsequently she tells me that this is something that she noted for somewhere around mid January as best she can remember. Currently she has been using mupirocin ointment and at times soaking this with Epsom salt but overall has not had any true treatment of the area. She does note having a hemoglobin A1c of 5.25 February 2021 although this is in [facility] I could not find that directly but that was what she reported back to [place]. Overall I am very pleased with where things stand though I think she could potentially benefit from Santyl to try to clean up the last little bit of necrotic tissue of the central portion of the wound to allow this to heal more effectively. Patient does have a history of hypertension, diabetes mellitus type 2, and again this appears to have been a pressure injury initially although right now she is just using what she called yoga socks with  a grip on the bottom which I think is perfect. 06/14/2021 upon evaluation today patient appears to be doing well with regard to her toe and actually very pleased with where things stand and I think she is making excellent progress here. Admission 11/02/2021 [name] is a 56 year old female with a past medical history of controlled type 2 diabetes on oral agents and insulin and morbid obesity that presents to the clinic for a 1-2-week history of some burn to the left foot. She has developed blistering and subsequently open wounds. [place] however her feet were likely exposed to the sun. She has been using Silvadene to the area. She currently denies signs of infection. Patient History Information obtained from Patient. Allergies penicillin (Severity: Moderate), amoxicillin (Severity: Moderate), vancomycin (Severity: Moderate), Sulfa (Sulfonamide Antibiotics) (Severity: Moderate),  levofloxacin (Severity: Moderate), bee sting kit, meloxicam, ACE Inhibitors, Demerol, Tylox, codeine, Percocet, bee venom protein (honey bee), Tequin, Levaquin, meperidine Social History Never smoker, Marital Status - Married, Alcohol Use - Rarely, Drug Use - No History, Caffeine Use - Daily. Medical History Hematologic/Lymphatic Patient has history of Lymphedema Respiratory Patient has history of Asthma, Sleep Apnea Cardiovascular Patient has history of Hypertension Gastrointestinal Patient has history of Colitis Endocrine Patient has history of Type II Diabetes Musculoskeletal Patient has history of Osteoarthritis Neurologic Patient has history of Neuropathy Patient is treated with Insulin, Oral Agents. Blood sugar is tested. Blood sugar results noted at the following times: Breakfast - 226. [name], [name] ([id number]) Objective Constitutional Vitals Time Taken: 8:51 AM, Height: 67 in, Source: Stated, Weight: 311 lbs, Source: Stated, BMI: 48.7,  Temperature: 97.9 F, Pulse: 111 bpm, Respiratory Rate: 18 breaths/min, Blood Pressure: 177/94 mmHg. General Notes: Left foot: Blistering and open wounds with granulation tissue, devitalized tissue and nonviable tissue to the dorsal aspect. Blanching noted. No surrounding soft tissue infection including increased warmth, erythema or purulent drainage. Integumentary (Hair, Skin) Wound #2 status is Open. Original cause of wound was Thermal Burn. The date acquired was: 10/29/2021. The wound is located on the [place]. The wound measures 7cm length x 8cm width x 0.2cm depth; 43.982cm^2 area and 8.796cm^3 volume. There is Fat Layer (Subcutaneous Tissue) exposed. There is no tunneling or undermining noted. There is a medium amount of serosanguineous drainage noted. There is small (1-33%) granulation within the wound bed. There is a large (67-100%) amount of necrotic tissue within the wound bed including Eschar and Adherent Slough. Assessment Active Problems ICD-10 Burn of second degree of left foot, initial encounter Type 2 diabetes mellitus with foot ulcer Patient presents with a second-degree burn to her left foot caused by a sunburn. No signs of surrounding infection. I debrided nonviable tissue. I recommended using Santyl to the nonviable tissue and Xeroform to the granulated areas. She states she has a new tube of Santyl at home that she can use. Follow-up in 1 week. Procedures Wound #2 Pre-procedure diagnosis of Wound #2 is a 2nd degree Burn located on the [place] . An Burn Debridement: Small procedure was performed by [name], MD. Post procedure Diagnosis Wound #2: Same as Pre-Procedure Notes: Debridement Performed for Assessment: Wound #2 Left,Anterior Foot Performed by: Physician Clinician [name] Debridement Type: Debridement Level of Consciousness pre-procedure: Awake and Alert Pre-procedure Verification/Time-Out Taken: Yes 09:20 Start Time: 09:20  Pain Control: Area based upon Length x Width = 56 (cm) Area Debrided: Length: (cm) 4 X Width: (cm) 4 = Total Surface Area Debrided: (cm) 16 Tissue and other material debrided: Viable Non-Viable Biofilm Blood Clots Bone Callus Cartilage Eschar Fascia Fat Fibrin/Exudate Hyper- granulation Joint Capsule Ligament Muscle Subcutaneous Skin: Dermis Skin: Epidermis Slough Tendon Other Level: Skin/Subcutaneous Tissue Debridement Description: Excisional Instrument: Blade Curette Forceps Nippers Rongeur Scissors Other Specimen: Swab Tissue Culture None Bleeding: Minimum Hemostasis Achieved: Pressure End Time: 09:29 Procedural Pain: 0 Post Procedural Pain: 0 Response to Treatment: Procedure was tolerated well Level of Consciousness post-procedure: Awake and Alert Open Post Debridement Measurements of Total Wound Length: (cm) 7 Width: (cm) 8 Depth: (cm) 0.2 Volume: (cm) 8.796 Character of Wound/Ulcer Post Debridement: Improved Requires Further Debridement Stable Reference values from 11/02/2021 Wound Assessment Length: (cm) 7 Width: (cm) 8 Depth: (cm) 0.2 Area:(cm) 43.982 Volume:(cm) 8.796 oImport Existing Debridement Details Import No Thanks Post Procedure Diagnosis Same as Pre Procedure Post Procedure Diagnosis - not same  as Pre-procedure Close Notes Plan Follow-up Appointments: Return Appointment in 1 week. Bathing/ Shower/ Hygiene: Wash wounds with antibacterial soap and water. Anesthetic (Use 'Patient Medications' Section for Anesthetic Order Entry): Lidocaine applied to wound bed WOUND #2: - Foot Wound Laterality: Left, Anterior Cleanser: Byram Ancillary Kit - 15 Day Supply (DME) (Generic) 1 x Per Day/30 Days Discharge Instructions: Use supplies as instructed; Kit contains: (15) Saline Bullets; (15) 3x3 Gauze; 15 pr Gloves Cleanser: Soap and Water 1 x Per Day/30 Days [place], [name] ([id number]) Discharge Instructions: Gently cleanse wound with antibacterial soap, rinse and pat dry prior to  dressing wounds Cleanser: Wound Cleanser 1 x Per Day/30 Days Discharge Instructions: Wash your hands with soap and water. Remove old dressing, discard into plastic bag and place into trash. Cleanse the wound with Wound Cleanser prior to applying a clean dressing using gauze sponges, not tissues or cotton balls. Do not scrub or use excessive force. Pat dry using gauze sponges, not tissue or cotton balls. Primary Dressing: Santyl Collagenase Ointment, 30 (gm), tube 1 x Per Day/30 Days Discharge Instructions: to slough tissue Primary Dressing: Xeroform 5x9-HBD (in/in) (DME) (Generic) 1 x Per Day/30 Days Discharge Instructions: Apply Xeroform 5x9-HBD (in/in) as directed Secondary Dressing: Kerlix 4.5 x 4.1 (in/yd) (DME) (Generic) 1 x Per Day/30 Days Discharge Instructions: Apply Kerlix 4.5 x 4.1 (in/yd) as instructed Secured With: Medipore Tape - 6M Medipore H Soft Cloth Surgical Tape, 2x2 (in/yd) (DME) (Generic) 1 x Per Day/30 Days 1. In office sharp debridement 2. Santyl 3. Xeroform 4. Follow-up in 1 week Electronic Signature(s) Signed: 11/02/2021 9:55:38 AM By: [name] DO Entered By: [name] on 11/02/2021 09:54:28 Stokke, [name] ([id number]) -------------------------------------------------------------------------------- ROS/PFSH Details Patient Name: Maria Cloud P. Date of Service: 11/02/2021 8:45 AM Medical Record Number: [id number] Patient Account Number: [id number] Date of Birth/Sex: 02-15-1966 (56 y.o. F) Treating RN: Carlene Coria Primary Care Provider: Alma Friendly Other Clinician: Referring Provider: Alma Friendly Treating Provider/Extender: Yaakov Guthrie in Treatment: 0 Information Obtained From Patient Hematologic/Lymphatic Medical History: Positive for: Lymphedema Respiratory Medical History: Positive for: Asthma; Sleep Apnea Cardiovascular Medical History: Positive for: Hypertension Gastrointestinal Medical History: Positive for:  Colitis Endocrine Medical History: Positive for: Type II Diabetes Time with diabetes: 1997 Treated with: Insulin, Oral agents Blood sugar tested every day: Yes Tested : Dexcom Blood sugar testing results: Breakfast: 226 Musculoskeletal Medical History: Positive for: Osteoarthritis Neurologic Medical History: Positive for: Neuropathy Immunizations Pneumococcal Vaccine: Received Pneumococcal Vaccination: Yes Received Pneumococcal Vaccination On or After 60th Birthday: No Implantable Devices None Family and Social History Never smoker; Marital Status - Married; Alcohol Use: Rarely; Drug Use: No History; Caffeine Use: Daily Electronic Signature(s) [name], [name] ([id number]) Signed: 11/02/2021 9:55:38 AM By: [name] DO Signed: 11/04/2021 6:13:50 PM By: Carlene Coria RN Entered By: Carlene Coria on 11/02/2021 08:57:09 Artus, [name] ([id number]) -------------------------------------------------------------------------------- SuperBill Details Patient Name: Maria Cloud P. Date of Service: 11/02/2021 Medical Record Number: [id number] Patient Account Number: [id number] Date of Birth/Sex: [birth year] (56 y.o. F) Treating RN: Carlene Coria Primary Care Provider: Alma Friendly Other Clinician: Referring Provider: Alma Friendly Treating Provider/Extender: Yaakov Guthrie in Treatment: 0 Diagnosis Coding ICD-10 Codes Code Description T25.222A Burn of second degree of left foot, initial encounter E11.621 Type 2 diabetes mellitus with foot ulcer Facility Procedures CPT4 Code: 58592924 Description: 99213 - WOUND CARE VISIT-LEV 3 EST PT Modifier: Quantity: 1 CPT4 Code: 46286381 Description: 16020 - BURN DRSG W/O ANESTH-SM Modifier: Quantity: 1 CPT4 Code: Description: ICD-10 Diagnosis Description T25.222A Burn  of second degree of left foot, initial encounter Modifier: Quantity: Physician Procedures CPT4 Code: 4600298 Description: 99214 - WC PHYS LEVEL 4 -  EST PT Modifier: Quantity: 1 CPT4 Code: Description: ICD-10 Diagnosis Description T25.222A Burn of second degree of left foot, initial encounter E11.621 Type 2 diabetes mellitus with foot ulcer Modifier: Quantity: CPT4 Code: 4730856 Description: 94370 - WC PHYS DRESS/DEBRID SM,<5% TOT BODY SURF Modifier: Quantity: 1 CPT4 Code: Description: ICD-10 Diagnosis Description T25.222A Burn of second degree of left foot, initial encounter Modifier: Quantity: Electronic Signature(s) Signed: 11/02/2021 9:55:38 AM By: [name] DO Entered By: [name] on 11/02/2021 09:55:02

## 2021-11-08 ENCOUNTER — Ambulatory Visit: Payer: Medicare PPO | Admitting: Physician Assistant

## 2021-11-09 ENCOUNTER — Encounter (HOSPITAL_BASED_OUTPATIENT_CLINIC_OR_DEPARTMENT_OTHER): Payer: Medicare PPO | Admitting: Internal Medicine

## 2021-11-09 DIAGNOSIS — E1140 Type 2 diabetes mellitus with diabetic neuropathy, unspecified: Secondary | ICD-10-CM | POA: Diagnosis not present

## 2021-11-09 DIAGNOSIS — M1990 Unspecified osteoarthritis, unspecified site: Secondary | ICD-10-CM | POA: Diagnosis not present

## 2021-11-09 DIAGNOSIS — E11621 Type 2 diabetes mellitus with foot ulcer: Secondary | ICD-10-CM | POA: Diagnosis not present

## 2021-11-09 DIAGNOSIS — T25222A Burn of second degree of left foot, initial encounter: Secondary | ICD-10-CM

## 2021-11-09 DIAGNOSIS — T798XXA Other early complications of trauma, initial encounter: Secondary | ICD-10-CM | POA: Diagnosis not present

## 2021-11-09 DIAGNOSIS — Z6841 Body Mass Index (BMI) 40.0 and over, adult: Secondary | ICD-10-CM | POA: Diagnosis not present

## 2021-11-09 DIAGNOSIS — E1151 Type 2 diabetes mellitus with diabetic peripheral angiopathy without gangrene: Secondary | ICD-10-CM | POA: Diagnosis not present

## 2021-11-09 DIAGNOSIS — J45909 Unspecified asthma, uncomplicated: Secondary | ICD-10-CM | POA: Diagnosis not present

## 2021-11-09 NOTE — Progress Notes (Signed)
Maria, [name] ([id number]) Visit Report for 11/09/2021 Chief Complaint Document Details Patient Name: [name], [name]. Date of Service: 11/09/2021 8:00 AM Medical Record Number: [id number] Patient Account Number: [id number] Date of Birth/Sex: 03-Apr-1965 (56 y.o. F) Treating RN: Carlene Coria Primary Care Provider: Alma Friendly Other Clinician: Referring Provider: Alma Friendly Treating Provider/Extender: Yaakov Guthrie in Treatment: 1 Information Obtained from: Patient Chief Complaint 06/06/2021; Left 2nd toe ulcer 11/02/2021; Sunburn to the left foot Electronic Signature(s) Signed: 11/09/2021 8:44:22 AM By: [name] DO Entered By: [name] on 11/09/2021 08:39:13 [name], [name] ([id number]) -------------------------------------------------------------------------------- HPI Details Patient Name: Maria Cloud P. Date of Service: 11/09/2021 8:00 AM Medical Record Number: [id number] Patient Account Number: [id number] Date of Birth/Sex: 22-Feb-1966 (56 y.o. F) Treating RN: Carlene Coria Primary Care Provider: Alma Friendly Other Clinician: Referring Provider: Alma Friendly Treating Provider/Extender: Yaakov Guthrie in Treatment: 1 History of Present Illness HPI Description: 06/06/2021 upon evaluation patient appears to be doing pretty well all things considered with regard to the wound on her left second toe. This is on the proximal interphalangeal joint. Subsequently she tells me that this is something that she noted for somewhere around mid January as best she can remember. Currently she has been using mupirocin ointment and at times soaking this with Epsom salt but overall has not had any true treatment of the area. She does note having a hemoglobin A1c of 5.25 February 2021 although this is [facility] I could not find that directly but that was what she reported back to [place]. Overall I am very pleased with where things stand though I  think she could potentially benefit from Santyl to try to clean up the last little bit of necrotic tissue of the central portion of the wound to allow this to heal more effectively. Patient does have a history of hypertension, diabetes mellitus type 2, and again this appears to have been a pressure injury initially although right now she is just using what she called yoga socks with a grip on the bottom which I think is perfect. 06/14/2021 upon evaluation today patient appears to be doing well with regard to her toe and actually very pleased with where things stand and I think she is making excellent progress here. Admission 11/02/2021 Ms. Anjelique Makar is a 56 year old female with a past medical history of controlled type 2 diabetes on oral agents and insulin and morbid obesity that presents to the clinic for a 1-2-week history of some burn to the left foot. She has developed blistering and subsequently open wounds. [place] however her feet were likely exposed to the sun. She has been using Silvadene to the area. She currently denies signs of infection. 8/23; patient presents for follow. She has been using Santyl directly to the area of nonviable tissue and Xeroform to the area of skin breakdown. She has no issues or complaints today. She reports improvement in wound healing. She denies signs of infection. Electronic Signature(s) Signed: 11/09/2021 8:44:22 AM By: [name] DO Entered By: [name] on 11/09/2021 08:39:47 [name], Tyarra Mamie Nick ([id number]) -------------------------------------------------------------------------------- Burn Debridement: Small Details Patient Name: Maria Cloud P. Date of Service: 11/09/2021 8:00 AM Medical Record Number: [id number] Patient Account Number: [id number] Date of Birth/Sex: [birth year] (55 y.o. F) Treating RN: Carlene Coria Primary Care Provider: Alma Friendly Other Clinician: Referring  Provider: Alma Friendly Treating Provider/Extender: Yaakov Guthrie in Treatment: 1 Procedure Performed for: Wound #2 Left,Anterior  Foot Performed By: Physician [name], MD Post Procedure Diagnosis Same as Pre-procedure Notes Procedures View Wound Information Debridement Performed for Assessment: Wound #2 Left,Anterior Foot Performed by: Physician Clinician [name] Debridement Type: Debridement Level of Consciousness pre-procedure: Awake and Alert Pre-procedure Verification/Time-Out Taken: Yes 08:30 Start Time: 08:30 Pain Control: Area based upon Length x Width = 13 (cmo) Area Debrided: Length: (cm) 2.5 X Width: (cm) 3 = Total Surface Area Debrided: (cmo) 7.5 Tissue and other material debrided: Viable Non-Viable Biofilm Blood Clots Bone Callus Cartilage Eschar Fascia Fat Fibrin/Exudate Hyper-granulation Joint Capsule Ligament Muscle Subcutaneous Skin: Dermis Skin: Epidermis Slough Tendon Other Level: Skin/Subcutaneous Tissue Debridement Description: Excisional Instrument: Blade Curette Forceps Nippers Rongeur Scissors Other Specimen: Swab Tissue Culture None Bleeding: Moderate Hemostasis Achieved: Pressure End Time: 08:33 Procedural Pain: 5 Post Procedural Pain: 0 Response to Treatment: Procedure was tolerated well Level of Consciousness post-procedure: Awake and Alert [name], [name] ([id number]) Open Post Debridement Measurements of Total Wound Length: (cm) 2.5 Width: (cm) 5.2 Depth: (cm) 0.2 Volume: (cmo) 2.042 Character of Wound/Ulcer Post Debridement: Improved Requires Further Debridement Stable Reference values from 11/09/2021 Wound Assessment Length: (cm) 2.5 Width: (cm) 5.2 Depth: (cm) 0.2 Area:(cmo) 10.21 Volume:(cmo) 2.042 oImport Existing Debridement Details Import No Thanks Post Procedure Diagnosis Same as Pre Procedure Post Procedure Diagnosis - not same as Pre-procedure Close Notes Electronic  Signature(s) Signed: 11/09/2021 8:41:44 AM By: Carlene Coria RN Entered By: Carlene Coria on 11/09/2021 08:41:44 [name], [name] -------------------------------------------------------------------------------- Physical Exam Details Patient Name: Maria Cloud P. Date of Service: 11/09/2021 8:00 AM Medical Record Number: [id number] Patient Account Number: [id number] Date of Birth/Sex: April 02, 1965 (56 y.o. F) Treating RN: Carlene Coria Primary Care Provider: Alma Friendly Other Clinician: Referring Provider: Alma Friendly Treating Provider/Extender: Yaakov Guthrie in Treatment: 1 Constitutional . Cardiovascular . Psychiatric . Notes Left foot: 1 large open wound with granulation tissue and tightly adhered nonviable tissue. Epithelization to the surrounding skin where there was previous wounds. No signs of surrounding infection. Electronic Signature(s) Signed: 11/09/2021 8:44:22 AM By: [name] DO Entered By: [name] on 11/09/2021 08:40:51 [name], [name] ([id number]) -------------------------------------------------------------------------------- Physician Orders Details Patient Name: Maria Cloud P. Date of Service: 11/09/2021 8:00 AM Medical Record Number: [id number] Patient Account Number: [id number] Date of Birth/Sex: 1965/06/15 (56 y.o. F) Treating RN: Carlene Coria Primary Care Provider: Alma Friendly Other Clinician: Referring Provider: Alma Friendly Treating Provider/Extender: Yaakov Guthrie in Treatment: 1 Verbal / Phone Orders: No Diagnosis Coding Follow-up Appointments o Return Appointment in 1 week. Bathing/ Shower/ Hygiene o Wash wounds with antibacterial soap and water. Anesthetic (Use 'Patient Medications' Section for Anesthetic Order Entry) o Lidocaine applied to wound bed Wound Treatment Wound #2 - Foot Wound Laterality: Left, Anterior Cleanser: Byram Ancillary Kit - 15 Day Supply (Generic) 1 x Per  Day/30 Days Discharge Instructions: Use supplies as instructed; Kit contains: (15) Saline Bullets; (15) 3x3 Gauze; 15 pr Gloves Cleanser: Soap and Water 1 x Per Day/30 Days Discharge Instructions: Gently cleanse wound with antibacterial soap, rinse and pat dry prior to dressing wounds Cleanser: Wound Cleanser 1 x Per Day/30 Days Discharge Instructions: Wash your hands with soap and water. Remove old dressing, discard into plastic bag and place into trash. Cleanse the wound with Wound Cleanser prior to applying a clean dressing using gauze sponges, not tissues or cotton balls. Do not scrub or use excessive force. Pat dry using gauze sponges, not tissue or cotton balls. Primary Dressing: Santyl Collagenase Ointment, 30 (gm), tube 1 x Per  Day/30 Days Discharge Instructions: to slough tissue Primary Dressing: Xeroform 5x9-HBD (in/in) (Generic) 1 x Per Day/30 Days Discharge Instructions: Apply Xeroform 5x9-HBD (in/in) as directed Secondary Dressing: Kerlix 4.5 x 4.1 (in/yd) (Generic) 1 x Per Day/30 Days Discharge Instructions: Apply Kerlix 4.5 x 4.1 (in/yd) as instructed Secured With: Medipore Tape - 55M Medipore H Soft Cloth Surgical Tape, 2x2 (in/yd) (Generic) 1 x Per Day/30 Days Electronic Signature(s) Signed: 11/09/2021 8:44:22 AM By: [name] DO Entered By: [name] on 11/09/2021 08:43:36 [name], [name] ([id number]) -------------------------------------------------------------------------------- Problem List Details Patient Name: Maria Cloud P. Date of Service: 11/09/2021 8:00 AM Medical Record Number: [id number] Patient Account Number: [id number] Date of Birth/Sex: 27-Apr-1965 (56 y.o. F) Treating RN: Carlene Coria Primary Care Provider: Alma Friendly Other Clinician: Referring Provider: Alma Friendly Treating Provider/Extender: Yaakov Guthrie in Treatment: 1 Active Problems ICD-10 Encounter Code Description Active Date MDM Diagnosis T25.222A Burn of  second degree of left foot, initial encounter 11/02/2021 No Yes E11.621 Type 2 diabetes mellitus with foot ulcer 11/02/2021 No Yes Inactive Problems Resolved Problems Electronic Signature(s) Signed: 11/09/2021 8:44:22 AM By: [name] DO Entered By: [name] on 11/09/2021 08:39:09 Woolworth, Leenah P. ([id number]) -------------------------------------------------------------------------------- Progress Note Details Patient Name: Maria Cloud P. Date of Service: 11/09/2021 8:00 AM Medical Record Number: [id number] Patient Account Number: [id number] Date of Birth/Sex: 1965-08-25 (56 y.o. F) Treating RN: Carlene Coria Primary Care Provider: Alma Friendly Other Clinician: Referring Provider: Alma Friendly Treating Provider/Extender: Yaakov Guthrie in Treatment: 1 Subjective Chief Complaint Information obtained from Patient 06/06/2021; Left 2nd toe ulcer 11/02/2021; Sunburn to the left foot History of Present Illness (HPI) 06/06/2021 upon evaluation patient appears to be doing pretty well all things considered with regard to the wound on her left second toe. This is on the proximal interphalangeal joint. Subsequently she tells me that this is something that she noted for somewhere around mid January as best she can remember. Currently she has been using mupirocin ointment and at times soaking this with Epsom salt but overall has not had any true treatment of the area. She does note having a hemoglobin A1c of 5.25 February 2021 although this [facility] I could not find that directly but that was what she reported back to [place]. Overall I am very pleased with where things stand though I think she could potentially benefit from Santyl to try to clean up the last little bit of necrotic tissue of the central portion of the wound to allow this to heal more effectively. Patient does have a history of hypertension, diabetes mellitus type 2, and again this appears to have  been a pressure injury initially although right now she is just using what she called yoga socks with a grip on the bottom which I think is perfect. 06/14/2021 upon evaluation today patient appears to be doing well with regard to her toe and actually very pleased with where things stand and I think she is making excellent progress here. Admission 11/02/2021 [name] is a 56 year old female with a past medical history of controlled type 2 diabetes on oral agents and insulin and morbid obesity that presents to the clinic for a 1-2-week history of some burn to the left foot. She has developed blistering and subsequently open wounds. [place] however her feet were likely exposed to the sun. She has been using Silvadene to the area. She currently denies signs of infection. 8/23; patient presents  for follow. She has been using Santyl directly to the area of nonviable tissue and Xeroform to the area of skin breakdown. She has no issues or complaints today. She reports improvement in wound healing. She denies signs of infection. Objective Constitutional Vitals Time Taken: 8:09 AM, Height: 67 in, Weight: 311 lbs, BMI: 48.7, Temperature: 97.9 F, Pulse: 112 bpm, Respiratory Rate: 20 breaths/min, Blood Pressure: 165/79 mmHg. General Notes: Left foot: 1 large open wound with granulation tissue and tightly adhered nonviable tissue. Epithelization to the surrounding skin where there was previous wounds. No signs of surrounding infection. Integumentary (Hair, Skin) Wound #2 status is Open. Original cause of wound was Thermal Burn. The date acquired was: 10/29/2021. The wound has been in treatment 1 weeks. The wound is located on the [place]. The wound measures 2.5cm length x 5.2cm width x 0.2cm depth; 10.21cm^2 area and 2.042cm^3 volume. There is Fat Layer (Subcutaneous Tissue) exposed. There is no tunneling or undermining noted. There is a  medium amount of serosanguineous drainage noted. There is small (1-33%) granulation within the wound bed. There is a large (67-100%) amount of necrotic tissue within the wound bed including Eschar and Adherent Slough. Assessment Active Problems ICD-10 Burn of second degree of left foot, initial encounter [name], [name] ([id number]) Type 2 diabetes mellitus with foot ulcer Patient's wounds have shown improvement in size and appearance since last clinic visit. I debrided nonviable tissue. At this time I recommended just continuing with Santyl To the wound bed. She can stop Xeroform. Follow-up in 1 week. Procedures Wound #2 Pre-procedure diagnosis of Wound #2 is a 2nd degree Burn located on the [place] . There was a Excisional Skin/Subcutaneous Tissue Debridement with a total area of 7.5 sq cm performed by [name], MD. With the following instrument(s): Curette to remove Viable and Non-Viable tissue/material. Material removed includes Subcutaneous Tissue, Slough, Skin: Dermis, Skin: Epidermis, and Biofilm. No specimens were taken. A time out was conducted at 08:30, prior to the start of the procedure. A Moderate amount of bleeding was controlled with Pressure. The procedure was tolerated well with a pain level of 5 throughout and a pain level of 0 following the procedure. Post Debridement Measurements: 2.5cm length x 5.2cm width x 0.2cm depth; 2.042cm^3 volume. Character of Wound/Ulcer Post Debridement is improved. Post procedure Diagnosis Wound #2: Same as Pre-Procedure Plan Follow-up Appointments: Return Appointment in 1 week. Bathing/ Shower/ Hygiene: Wash wounds with antibacterial soap and water. Anesthetic (Use 'Patient Medications' Section for Anesthetic Order Entry): Lidocaine applied to wound bed WOUND #2: - Foot Wound Laterality: Left, Anterior Cleanser: Byram Ancillary Kit - 15 Day Supply (Generic) 1 x Per Day/30 Days Discharge Instructions: Use supplies as  instructed; Kit contains: (15) Saline Bullets; (15) 3x3 Gauze; 15 pr Gloves Cleanser: Soap and Water 1 x Per Day/30 Days Discharge Instructions: Gently cleanse wound with antibacterial soap, rinse and pat dry prior to dressing wounds Cleanser: Wound Cleanser 1 x Per Day/30 Days Discharge Instructions: Wash your hands with soap and water. Remove old dressing, discard into plastic bag and place into trash. Cleanse the wound with Wound Cleanser prior to applying a clean dressing using gauze sponges, not tissues or cotton balls. Do not scrub or use excessive force. Pat dry using gauze sponges, not tissue or cotton balls. Primary Dressing: Santyl Collagenase Ointment, 30 (gm), tube 1 x Per Day/30 Days Discharge Instructions: to slough tissue Primary Dressing: Xeroform 5x9-HBD (in/in) (Generic) 1 x Per Day/30 Days Discharge Instructions: Apply Xeroform 5x9-HBD (in/in)  as directed Secondary Dressing: Kerlix 4.5 x 4.1 (in/yd) (Generic) 1 x Per Day/30 Days Discharge Instructions: Apply Kerlix 4.5 x 4.1 (in/yd) as instructed Secured With: Medipore Tape - 44M Medipore H Soft Cloth Surgical Tape, 2x2 (in/yd) (Generic) 1 x Per Day/30 Days 1. In office sharp debridement 2. Santyl 3. Follow-up in 1 week Electronic Signature(s) Signed: 11/09/2021 8:44:22 AM By: [name] DO Entered By: [name] on 11/09/2021 08:42:50 [name], [name] ([id number]) -------------------------------------------------------------------------------- SuperBill Details Patient Name: Maria Cloud P. Date of Service: 11/09/2021 Medical Record Number: [id number] Patient Account Number: [id number] Date of Birth/Sex: 03-17-1966 (56 y.o. F) Treating RN: Carlene Coria Primary Care Provider: Alma Friendly Other Clinician: Referring Provider: Alma Friendly Treating Provider/Extender: Yaakov Guthrie in Treatment: 1 Diagnosis Coding ICD-10 Codes Code Description T25.222A Burn of second degree of left foot,  initial encounter E11.621 Type 2 diabetes mellitus with foot ulcer Facility Procedures CPT4 Code: [id number] Description: 16020 - BURN DRSG W/O ANESTH-SM Modifier: Quantity: 1 CPT4 Code: Description: ICD-10 Diagnosis Description T25.222A Burn of second degree of left foot, initial encounter E11.621 Type 2 diabetes mellitus with foot ulcer Modifier: Quantity: Physician Procedures CPT4 Code: [id number] Description: 16020 - WC PHYS DRESS/DEBRID SM,<5% TOT BODY SURF Modifier: Quantity: 1 CPT4 Code: Description: ICD-10 Diagnosis Description T25.222A Burn of second degree of left foot, initial encounter E11.621 Type 2 diabetes mellitus with foot ulcer Modifier: Quantity: Electronic Signature(s) Signed: 11/09/2021 8:44:22 AM By: [name] DO Entered By: [name] on 11/09/2021 08:43:28

## 2021-11-10 NOTE — Progress Notes (Signed)
Maria, [name] ([id number]) Visit Report for 11/09/2021 Arrival Information Details Patient Name: [name], [name]. Date of Service: 11/09/2021 8:00 AM Medical Record Number: [id number] Patient Account Number: [id number] Date of Birth/Sex: 09/27/1965 (56 y.o. F) Treating RN: Carlene Coria Primary Care Creston Klas: Alma Friendly Other Clinician: Referring Mohini Heathcock: Alma Friendly Treating Trigo Winterbottom/Extender: Yaakov Guthrie in Treatment: 1 Visit Information History Since Last Visit All ordered tests and consults were completed: No Patient Arrived: Wheel Chair Added or deleted any medications: No Arrival Time: 08:01 Any new allergies or adverse reactions: No Accompanied By: husband Had a fall or experienced change in No Transfer Assistance: None activities of daily living that may affect Patient Identification Verified: Yes risk of falls: Secondary Verification Process Completed: Yes Signs or symptoms of abuse/neglect since last visito No Patient Requires Transmission-Based Precautions: No Hospitalized since last visit: No Patient Has Alerts: No Implantable device outside of the clinic excluding No cellular tissue based products placed in the center since last visit: Has Dressing in Place as Prescribed: Yes Has Compression in Place as Prescribed: Yes Pain Present Now: No Electronic Signature(s) Signed: 11/10/2021 4:19:23 PM By: Carlene Coria RN Entered By: Carlene Coria on 11/09/2021 08:09:18 Brosky, Katherine Roan ([id number]) -------------------------------------------------------------------------------- Clinic Level of Care Assessment Details Patient Name: Maria Cloud P. Date of Service: 11/09/2021 8:00 AM Medical Record Number: [id number] Patient Account Number: [id number] Date of Birth/Sex: 06/24/1965 (56 y.o. F) Treating RN: Carlene Coria Primary Care Rob Mciver: Alma Friendly Other Clinician: Referring Jozelynn Danielson: Alma Friendly Treating Bryleigh Ottaway/Extender: Yaakov Guthrie in Treatment: 1 Clinic Level of Care Assessment Items TOOL 1 Quantity Score []  - Use when EandM and Procedure is performed on INITIAL visit 0 ASSESSMENTS - Nursing Assessment / Reassessment []  - General Physical Exam (combine w/ comprehensive assessment (listed just below) when performed on new 0 pt. evals) []  - 0 Comprehensive Assessment (HX, ROS, Risk Assessments, Wounds Hx, etc.) ASSESSMENTS - Wound and Skin Assessment / Reassessment []  - Dermatologic / Skin Assessment (not related to wound area) 0 ASSESSMENTS - Ostomy and/or Continence Assessment and Care []  - Incontinence Assessment and Management 0 []  - 0 Ostomy Care Assessment and Management (repouching, etc.) PROCESS - Coordination of Care []  - Simple Patient / Family Education for ongoing care 0 []  - 0 Complex (extensive) Patient / Family Education for ongoing care []  - 0 Staff obtains Programmer, systems, Records, Test Results / Process Orders []  - 0 Staff telephones HHA, Nursing Homes / Clarify orders / etc []  - 0 Routine Transfer to another Facility (non-emergent condition) []  - 0 Routine Hospital Admission (non-emergent condition) []  - 0 New Admissions / Biomedical engineer / Ordering NPWT, Apligraf, etc. []  - 0 Emergency Hospital Admission (emergent condition) PROCESS - Special Needs []  - Pediatric / Minor Patient Management 0 []  - 0 Isolation Patient Management []  - 0 Hearing / Language / Visual special needs []  - 0 Assessment of Community assistance (transportation, D/C planning, etc.) []  - 0 Additional assistance / Altered mentation []  - 0 Support Surface(s) Assessment (bed, cushion, seat, etc.) INTERVENTIONS - Miscellaneous []  - External ear exam 0 []  - 0 Patient Transfer (multiple staff / Civil Service fast streamer / Similar devices) []  - 0 Simple Staple / Suture removal (25 or less) []  - 0 Complex Staple / Suture removal (26 or more) []  - 0 Hypo/Hyperglycemic Management (do not check if billed  separately) []  - 0 Ankle / Brachial Index (ABI) - do not check if billed separately Has the patient been seen at the hospital within the  last three years: Yes Total Score: 0 Level Of Care: ____ [name] ([id number]) Electronic Signature(s) Signed: 11/10/2021 4:19:23 PM By: Carlene Coria RN Entered By: Carlene Coria on 11/09/2021 08:32:10 Parsell, Katherine Roan ([id number]) -------------------------------------------------------------------------------- Encounter Discharge Information Details Patient Name: Maria Cloud P. Date of Service: 11/09/2021 8:00 AM Medical Record Number: [id number] Patient Account Number: [id number] Date of Birth/Sex: 08-20-1965 (56 y.o. F) Treating RN: Carlene Coria Primary Care Pascuala Klutts: Alma Friendly Other Clinician: Referring Randale Carvalho: Alma Friendly Treating Hershel Corkery/Extender: Yaakov Guthrie in Treatment: 1 Encounter Discharge Information Items Post Procedure Vitals Discharge Condition: Stable Temperature (F): 97.9 Ambulatory Status: Wheelchair Pulse (bpm): 112 Discharge Destination: Home Respiratory Rate (breaths/min): 20 Transportation: Private Auto Blood Pressure (mmHg): 165/79 Accompanied By: husband Schedule Follow-up Appointment: Yes Clinical Summary of Care: Electronic Signature(s) Signed: 11/10/2021 4:19:23 PM By: Carlene Coria RN Entered By: Carlene Coria on 11/09/2021 08:33:23 Leja, Shayleigh P. ([id number]) -------------------------------------------------------------------------------- Lower Extremity Assessment Details Patient Name: Maria Cloud P. Date of Service: 11/09/2021 8:00 AM Medical Record Number: [id number] Patient Account Number: [id number] Date of Birth/Sex: January 22, 1966 (56 y.o. F) Treating RN: Carlene Coria Primary Care Cyncere Ruhe: Alma Friendly Other Clinician: Referring Ryli Standlee: Alma Friendly Treating Margert Edsall/Extender: Yaakov Guthrie in Treatment: 1 Edema Assessment Assessed: [Left: No] [Right:  No] Edema: [Left: Yes] [Right: Yes] Calf Left: Right: Point of Measurement: 30 cm From Medial Instep 49 cm Ankle Left: Right: Point of Measurement: 10 cm From Medial Instep 25 cm Vascular Assessment Pulses: Dorsalis Pedis Palpable: [Left:Yes] Electronic Signature(s) Signed: 11/10/2021 4:19:23 PM By: Carlene Coria RN Entered By: Carlene Coria on 11/09/2021 08:17:13 Knipp, Brittnay P. ([id number]) -------------------------------------------------------------------------------- Multi Wound Chart Details Patient Name: Maria Cloud P. Date of Service: 11/09/2021 8:00 AM Medical Record Number: [id number] Patient Account Number: [id number] Date of Birth/Sex: 1965/05/28 (56 y.o. F) Treating RN: Carlene Coria Primary Care Ogden Handlin: Alma Friendly Other Clinician: Referring Ki Corbo: Alma Friendly Treating Alisea Matte/Extender: Yaakov Guthrie in Treatment: 1 Vital Signs Height(in): 67 Pulse(bpm): 112 Weight(lbs): 311 Blood Pressure(mmHg): 165/79 Body Mass Index(BMI): 48.7 Temperature(F): 97.9 Respiratory Rate(breaths/min): 20 Photos: [N/A:N/A] Wound Location: Left, Anterior Foot N/A N/A Wounding Event: Thermal Burn N/A N/A Primary Etiology: 2nd degree Burn N/A N/A Comorbid History: Lymphedema, Asthma, Sleep N/A N/A Apnea, Hypertension, Colitis, Type II Diabetes, Osteoarthritis, Neuropathy Date Acquired: 10/29/2021 N/A N/A Weeks of Treatment: 1 N/A N/A Wound Status: Open N/A N/A Wound Recurrence: No N/A N/A Clustered Wound: Yes N/A N/A Measurements L x W x D (cm) 2.5x5.2x0.2 N/A N/A Area (cm) : 10.21 N/A N/A Volume (cm) : 2.042 N/A N/A % Reduction in Area: 76.80% N/A N/A % Reduction in Volume: 76.80% N/A N/A Classification: Full Thickness Without Exposed N/A N/A Support Structures Exudate Amount: Medium N/A N/A Exudate Type: Serosanguineous N/A N/A Exudate Color: red, brown N/A N/A Granulation Amount: Small (1-33%) N/A N/A Necrotic Amount: Large (67-100%) N/A  N/A Necrotic Tissue: Eschar, Adherent Slough N/A N/A Exposed Structures: Fat Layer (Subcutaneous Tissue): N/A N/A Yes Fascia: No Tendon: No Muscle: No Joint: No Bone: No Epithelialization: None N/A N/A Treatment Notes Electronic Signature(s) Signed: 11/10/2021 4:19:23 PM By: Carlene Coria RN Entered By: Carlene Coria on 11/09/2021 08:17:32 Sugg, Clarabell P. ([id number]) Blackstock, Siddhi Mamie Nick ([id number]) -------------------------------------------------------------------------------- Multi-Disciplinary Care Plan Details Patient Name: Maria Cloud P. Date of Service: 11/09/2021 8:00 AM Medical Record Number: [id number] Patient Account Number: [id number] Date of Birth/Sex: 15-Aug-1965 (57 y.o. F) Treating RN: Carlene Coria Primary Care Nickoles Gregori: Alma Friendly Other Clinician: Referring Anokhi Shannon: Alma Friendly Treating Radford Pease/Extender: Yaakov Guthrie in Treatment: 1 Active Inactive  Abuse / Safety / Falls / Self Care Management Nursing Diagnoses: Potential for falls Goals: Patient/caregiver will verbalize understanding of skin care regimen Date Initiated: 11/02/2021 Target Resolution Date: 12/03/2021 Goal Status: Active Interventions: Assess Activities of Daily Living upon admission and as needed Assess fall risk on admission and as needed Assess: immobility, friction, shearing, incontinence upon admission and as needed Assess impairment of mobility on admission and as needed per policy Assess personal safety and home safety (as indicated) on admission and as needed Assess self care needs on admission and as needed Notes: Wound/Skin Impairment Nursing Diagnoses: Knowledge deficit related to ulceration/compromised skin integrity Goals: Patient/caregiver will verbalize understanding of skin care regimen Date Initiated: 11/02/2021 Target Resolution Date: 12/03/2021 Goal Status: Active Ulcer/skin breakdown will have a volume reduction of 30% by week 4 Date Initiated:  11/02/2021 Target Resolution Date: 01/02/2022 Goal Status: Active Ulcer/skin breakdown will have a volume reduction of 50% by week 8 Date Initiated: 11/02/2021 Target Resolution Date: 02/02/2022 Goal Status: Active Ulcer/skin breakdown will have a volume reduction of 80% by week 12 Date Initiated: 11/02/2021 Target Resolution Date: 03/04/2022 Goal Status: Active Ulcer/skin breakdown will heal within 14 weeks Date Initiated: 11/02/2021 Target Resolution Date: 04/04/2022 Goal Status: Active Interventions: Assess patient/caregiver ability to obtain necessary supplies Assess patient/caregiver ability to perform ulcer/skin care regimen upon admission and as needed Assess ulceration(s) every visit Notes: Electronic Signature(s) Signed: 11/10/2021 4:19:23 PM By: Carlene Coria RN Entered By: Carlene Coria on 11/09/2021 08:17:26 Plancarte, Ben P. ([id number]) Stencil, Iyonnah P. ([id number]) -------------------------------------------------------------------------------- Pain Assessment Details Patient Name: Maria Cloud P. Date of Service: 11/09/2021 8:00 AM Medical Record Number: [id number] Patient Account Number: [id number] Date of Birth/Sex: Sep 25, 1965 (56 y.o. F) Treating RN: Carlene Coria Primary Care Bruno Leach: Alma Friendly Other Clinician: Referring Samika Vetsch: Alma Friendly Treating Jacquese Cassarino/Extender: Yaakov Guthrie in Treatment: 1 Active Problems Location of Pain Severity and Description of Pain Patient Has Paino No Site Locations Pain Management and Medication Current Pain Management: Electronic Signature(s) Signed: 11/10/2021 4:19:23 PM By: Carlene Coria RN Entered By: Carlene Coria on 11/09/2021 08:09:42 Gaumond, Katherine Roan ([id number]) -------------------------------------------------------------------------------- Patient/Caregiver Education Details Patient Name: Maria Cloud P. Date of Service: 11/09/2021 8:00 AM Medical Record Number: [id number] Patient Account  Number: [id number] Date of Birth/Gender: [birth year] (56 y.o. F) Treating RN: Carlene Coria Primary Care Physician: Alma Friendly Other Clinician: Referring Physician: Alma Friendly Treating Physician/Extender: Yaakov Guthrie in Treatment: 1 Education Assessment Education Provided To: Patient Education Topics Provided Wound/Skin Impairment: Methods: Explain/Verbal Responses: State content correctly Electronic Signature(s) Signed: 11/10/2021 4:19:23 PM By: Carlene Coria RN Entered By: Carlene Coria on 11/09/2021 08:32:22 Penado, Katherine Roan ([id number]) -------------------------------------------------------------------------------- Wound Assessment Details Patient Name: Maria Cloud P. Date of Service: 11/09/2021 8:00 AM Medical Record Number: [id number] Patient Account Number: [id number] Date of Birth/Sex: 03/08/1966 (56 y.o. F) Treating RN: Carlene Coria Primary Care Okie Bogacz: Alma Friendly Other Clinician: Referring Rosario Kushner: Alma Friendly Treating Koriana Stepien/Extender: Yaakov Guthrie in Treatment: 1 Wound Status Wound Number: 2 Primary 2nd degree Burn Etiology: Wound Location: Left, Anterior Foot Wound Open Wounding Event: Thermal Burn Status: Date Acquired: 10/29/2021 Comorbid Lymphedema, Asthma, Sleep Apnea, Hypertension, Weeks Of Treatment: 1 History: Colitis, Type II Diabetes, Osteoarthritis, Neuropathy Clustered Wound: Yes Photos Wound Measurements Length: (cm) 2.5 Width: (cm) 5.2 Depth: (cm) 0.2 Area: (cm) 10.21 Volume: (cm) 2.042 % Reduction in Area: 76.8% % Reduction in Volume: 76.8% Epithelialization: None Tunneling: No Undermining: No Wound Description Classification: Full Thickness Without Exposed Support Structures Exudate Amount: Medium Exudate Type: Serosanguineous Exudate Color: red,  brown Foul Odor After Cleansing: No Slough/Fibrino Yes Wound Bed Granulation Amount: Small (1-33%) Exposed Structure Necrotic Amount: Large  (67-100%) Fascia Exposed: No Necrotic Quality: Eschar, Adherent Slough Fat Layer (Subcutaneous Tissue) Exposed: Yes Tendon Exposed: No Muscle Exposed: No Joint Exposed: No Bone Exposed: No Treatment Notes Wound #2 (Foot) Wound Laterality: Left, Anterior Cleanser [place] Ancillary Kit - 15 Day Supply Discharge Instruction: Use supplies as instructed; Kit contains: (15) Saline Bullets; (15) 3x3 Gauze; 15 pr Gloves Soap and Water Discharge Instruction: Gently cleanse wound with antibacterial soap, rinse and pat dry prior to dressing wounds Taber, Malijah P. ([id number]) Wound Cleanser Discharge Instruction: Wash your hands with soap and water. Remove old dressing, discard into plastic bag and place into trash. Cleanse the wound with Wound Cleanser prior to applying a clean dressing using gauze sponges, not tissues or cotton balls. Do not scrub or use excessive force. Pat dry using gauze sponges, not tissue or cotton balls. Peri-Wound Care Topical Primary Dressing Santyl Collagenase Ointment, 30 (gm), tube Discharge Instruction: to slough tissue Xeroform 5x9-HBD (in/in) Discharge Instruction: Apply Xeroform 5x9-HBD (in/in) as directed Secondary Dressing Kerlix 4.5 x 4.1 (in/yd) Discharge Instruction: Apply Kerlix 4.5 x 4.1 (in/yd) as instructed Secured With Medipore Tape - 6M Medipore H Soft Cloth Surgical Tape, 2x2 (in/yd) Compression Wrap Compression Stockings Add-Ons Electronic Signature(s) Signed: 11/10/2021 4:19:23 PM By: Carlene Coria RN Entered By: Carlene Coria on 11/09/2021 08:16:16 Boyar, Cieara P. ([id number]) -------------------------------------------------------------------------------- Vitals Details Patient Name: Maria Cloud P. Date of Service: 11/09/2021 8:00 AM Medical Record Number: [id number] Patient Account Number: [id number] Date of Birth/Sex: [birth year] (56 y.o. F) Treating RN: Carlene Coria Primary Care Jenniger Figiel: Alma Friendly Other Clinician: Referring  Ruthann Angulo: Alma Friendly Treating Menelik Mcfarren/Extender: Yaakov Guthrie in Treatment: 1 Vital Signs Time Taken: 08:09 Temperature (F): 97.9 Height (in): 67 Pulse (bpm): 112 Weight (lbs): 311 Respiratory Rate (breaths/min): 20 Body Mass Index (BMI): 48.7 Blood Pressure (mmHg): 165/79 Reference Range: 80 - 120 mg / dl Electronic Signature(s) Signed: 11/10/2021 4:19:23 PM By: Carlene Coria RN Entered By: Carlene Coria on 11/09/2021 08:09:33

## 2021-11-16 ENCOUNTER — Encounter (HOSPITAL_BASED_OUTPATIENT_CLINIC_OR_DEPARTMENT_OTHER): Payer: Medicare PPO | Admitting: Internal Medicine

## 2021-11-16 ENCOUNTER — Ambulatory Visit
Admission: RE | Admit: 2021-11-16 | Discharge: 2021-11-16 | Disposition: A | Discharge status: Home / Self Care | Payer: Medicare PPO | Source: Ambulatory Visit

## 2021-11-16 DIAGNOSIS — Z1231 Encounter for screening mammogram for malignant neoplasm of breast: Secondary | ICD-10-CM | POA: Diagnosis not present

## 2021-11-16 DIAGNOSIS — E1140 Type 2 diabetes mellitus with diabetic neuropathy, unspecified: Secondary | ICD-10-CM | POA: Diagnosis not present

## 2021-11-16 DIAGNOSIS — T25222A Burn of second degree of left foot, initial encounter: Secondary | ICD-10-CM

## 2021-11-16 DIAGNOSIS — M1990 Unspecified osteoarthritis, unspecified site: Secondary | ICD-10-CM | POA: Diagnosis not present

## 2021-11-16 DIAGNOSIS — T798XXA Other early complications of trauma, initial encounter: Secondary | ICD-10-CM | POA: Diagnosis not present

## 2021-11-16 DIAGNOSIS — Z6841 Body Mass Index (BMI) 40.0 and over, adult: Secondary | ICD-10-CM | POA: Diagnosis not present

## 2021-11-16 DIAGNOSIS — E11621 Type 2 diabetes mellitus with foot ulcer: Secondary | ICD-10-CM | POA: Diagnosis not present

## 2021-11-16 DIAGNOSIS — J45909 Unspecified asthma, uncomplicated: Secondary | ICD-10-CM | POA: Diagnosis not present

## 2021-11-16 DIAGNOSIS — E1151 Type 2 diabetes mellitus with diabetic peripheral angiopathy without gangrene: Secondary | ICD-10-CM | POA: Diagnosis not present

## 2021-11-18 NOTE — Progress Notes (Signed)
OTIS, BURRESS ([id number]) Visit Report for 11/16/2021 Arrival Information Details Patient Name: [name], HOBIN. Date of Service: 11/16/2021 12:30 PM Medical Record Number: [id number] Patient Account Number: [id number] Date of Birth/Sex: 08-Jun-1965 (56 y.o. F) Treating RN: Carlene Coria Primary Care Catharina Pica: Alma Friendly Other Clinician: Referring Maylin Freeburg: Alma Friendly Treating Cara Aguino/Extender: Yaakov Guthrie in Treatment: 2 Visit Information History Since Last Visit All ordered tests and consults were completed: No Patient Arrived: Ambulatory Added or deleted any medications: No Arrival Time: 12:36 Any new allergies or adverse reactions: No Accompanied By: husband Had a fall or experienced change in No Transfer Assistance: None activities of daily living that may affect Patient Identification Verified: Yes risk of falls: Secondary Verification Process Completed: Yes Signs or symptoms of abuse/neglect since last visito No Patient Requires Transmission-Based Precautions: No Hospitalized since last visit: No Patient Has Alerts: No Implantable device outside of the clinic excluding No cellular tissue based products placed in the center since last visit: Has Dressing in Place as Prescribed: Yes Pain Present Now: No Electronic Signature(s) Signed: 11/17/2021 8:44:03 PM By: Carlene Coria RN Entered By: Carlene Coria on 11/16/2021 12:40:25 Peretz, Winona P. ([id number]) -------------------------------------------------------------------------------- Clinic Level of Care Assessment Details Patient Name: Maria Cloud P. Date of Service: 11/16/2021 12:30 PM Medical Record Number: [id number] Patient Account Number: [id number] Date of Birth/Sex: 02-Jul-1965 (56 y.o. F) Treating RN: Carlene Coria Primary Care Hamzah Savoca: Alma Friendly Other Clinician: Referring Berkeley Veldman: Alma Friendly Treating Avielle Imbert/Extender: Yaakov Guthrie in Treatment: 2 Clinic Level of  Care Assessment Items TOOL 4 Quantity Score X - Use when only an EandM is performed on FOLLOW-UP visit 1 0 ASSESSMENTS - Nursing Assessment / Reassessment X - Reassessment of Co-morbidities (includes updates in patient status) 1 10 X- 1 5 Reassessment of Adherence to Treatment Plan ASSESSMENTS - Wound and Skin Assessment / Reassessment X - Simple Wound Assessment / Reassessment - one wound 1 5 []  - 0 Complex Wound Assessment / Reassessment - multiple wounds []  - 0 Dermatologic / Skin Assessment (not related to wound area) ASSESSMENTS - Focused Assessment []  - Circumferential Edema Measurements - multi extremities 0 []  - 0 Nutritional Assessment / Counseling / Intervention []  - 0 Lower Extremity Assessment (monofilament, tuning fork, pulses) []  - 0 Peripheral Arterial Disease Assessment (using hand held doppler) ASSESSMENTS - Ostomy and/or Continence Assessment and Care []  - Incontinence Assessment and Management 0 []  - 0 Ostomy Care Assessment and Management (repouching, etc.) PROCESS - Coordination of Care X - Simple Patient / Family Education for ongoing care 1 15 []  - 0 Complex (extensive) Patient / Family Education for ongoing care []  - 0 Staff obtains Programmer, systems, Records, Test Results / Process Orders []  - 0 Staff telephones HHA, Nursing Homes / Clarify orders / etc []  - 0 Routine Transfer to another Facility (non-emergent condition) []  - 0 Routine Hospital Admission (non-emergent condition) []  - 0 New Admissions / Biomedical engineer / Ordering NPWT, Apligraf, etc. []  - 0 Emergency Hospital Admission (emergent condition) X- 1 10 Simple Discharge Coordination []  - 0 Complex (extensive) Discharge Coordination PROCESS - Special Needs []  - Pediatric / Minor Patient Management 0 []  - 0 Isolation Patient Management []  - 0 Hearing / Language / Visual special needs []  - 0 Assessment of Community assistance (transportation, D/C planning, etc.) []  - 0 Additional  assistance / Altered mentation []  - 0 Support Surface(s) Assessment (bed, cushion, seat, etc.) INTERVENTIONS - Wound Cleansing / Measurement Bontempo, Marvin P. ([id number]) X- 1 5 Simple Wound Cleansing -  one wound $RemoveB'[]'xsWuuHlq$  - 0 Complex Wound Cleansing - multiple wounds X- 1 5 Wound Imaging (photographs - any number of wounds) $RemoveBe'[]'lHGPpNoMG$  - 0 Wound Tracing (instead of photographs) X- 1 5 Simple Wound Measurement - one wound $RemoveB'[]'FzXxGZiD$  - 0 Complex Wound Measurement - multiple wounds INTERVENTIONS - Wound Dressings X - Small Wound Dressing one or multiple wounds 1 10 $Re'[]'scw$  - 0 Medium Wound Dressing one or multiple wounds $RemoveBeforeD'[]'YDCasMslmdCiuB$  - 0 Large Wound Dressing one or multiple wounds $RemoveBeforeD'[]'mHFFnHYuZKaPJy$  - 0 Application of Medications - topical $RemoveB'[]'STHvZOwS$  - 0 Application of Medications - injection INTERVENTIONS - Miscellaneous $RemoveBeforeD'[]'XJOspWzfvPnSaT$  - External ear exam 0 $Remo'[]'NxfDW$  - 0 Specimen Collection (cultures, biopsies, blood, body fluids, etc.) $RemoveBefor'[]'WvWXYXVqzksN$  - 0 Specimen(s) / Culture(s) sent or taken to Lab for analysis $RemoveBefo'[]'ATrRhvNKuQP$  - 0 Patient Transfer (multiple staff / Civil Service fast streamer / Similar devices) $RemoveBeforeDE'[]'ctnwsGSXdSmVdJM$  - 0 Simple Staple / Suture removal (25 or less) $Remove'[]'bNbAzfM$  - 0 Complex Staple / Suture removal (26 or more) $Remove'[]'uhfzdCA$  - 0 Hypo / Hyperglycemic Management (close monitor of Blood Glucose) $RemoveBefore'[]'oAsPVcaGGwrdP$  - 0 Ankle / Brachial Index (ABI) - do not check if billed separately X- 1 5 Vital Signs Has the patient been seen at the hospital within the last three years: Yes Total Score: 75 Level Of Care: New/Established - Level 2 Electronic Signature(s) Signed: 11/17/2021 8:44:03 PM By: Carlene Coria RN Entered By: Carlene Coria on 11/16/2021 12:58:47 Lowy, Liliah Mamie Nick ([id number]) -------------------------------------------------------------------------------- Encounter Discharge Information Details Patient Name: Maria Cloud P. Date of Service: 11/16/2021 12:30 PM Medical Record Number: [id number] Patient Account Number: [id number] Date of Birth/Sex: January 28, 1966 (56 y.o. F) Treating RN: Carlene Coria Primary  Care Syrianna Schillaci: Alma Friendly Other Clinician: Referring Kenny Stern: Alma Friendly Treating Aloise Copus/Extender: Yaakov Guthrie in Treatment: 2 Encounter Discharge Information Items Discharge Condition: Stable Ambulatory Status: Wheelchair Discharge Destination: Home Transportation: Private Auto Accompanied By: husband Schedule Follow-up Appointment: Yes Clinical Summary of Care: Electronic Signature(s) Signed: 11/17/2021 8:44:03 PM By: Carlene Coria RN Entered By: Carlene Coria on 11/16/2021 12:59:52 Gupta, Shelaine P. ([id number]) -------------------------------------------------------------------------------- Lower Extremity Assessment Details Patient Name: Maria Cloud P. Date of Service: 11/16/2021 12:30 PM Medical Record Number: [id number] Patient Account Number: [id number] Date of Birth/Sex: 1965/05/18 (56 y.o. F) Treating RN: Carlene Coria Primary Care Makynzie Dobesh: Alma Friendly Other Clinician: Referring Pattrick Bady: Alma Friendly Treating Johnnetta Holstine/Extender: Yaakov Guthrie in Treatment: 2 Edema Assessment Assessed: [Left: No] [Right: No] Edema: [Left: Ye] [Right: s] Calf Left: Right: Point of Measurement: 30 cm From Medial Instep 49 cm Ankle Left: Right: Point of Measurement: 10 cm From Medial Instep 26 cm Vascular Assessment Pulses: Dorsalis Pedis Palpable: [Left:Yes] Electronic Signature(s) Signed: 11/17/2021 8:44:03 PM By: Carlene Coria RN Entered By: Carlene Coria on 11/16/2021 12:46:32 Delavega, Diamonds P. ([id number]) -------------------------------------------------------------------------------- Multi Wound Chart Details Patient Name: Maria Cloud P. Date of Service: 11/16/2021 12:30 PM Medical Record Number: [id number] Patient Account Number: [id number] Date of Birth/Sex: 1965/07/11 (56 y.o. F) Treating RN: Carlene Coria Primary Care Emilly Lavey: Alma Friendly Other Clinician: Referring Tanmay Halteman: Alma Friendly Treating Beatrix Breece/Extender:  Yaakov Guthrie in Treatment: 2 Vital Signs Height(in): 67 Pulse(bpm): 96 Weight(lbs): 311 Blood Pressure(mmHg): 137/79 Body Mass Index(BMI): 48.7 Temperature(F): 98.2 Respiratory Rate(breaths/min): 18 Photos: [N/A:N/A] Wound Location: Left, Anterior Foot N/A N/A Wounding Event: Thermal Burn N/A N/A Primary Etiology: 2nd degree Burn N/A N/A Comorbid History: Lymphedema, Asthma, Sleep N/A N/A Apnea, Hypertension, Colitis, Type II Diabetes, Osteoarthritis, Neuropathy Date Acquired: 10/29/2021 N/A N/A Weeks of Treatment: 2 N/A N/A Wound Status: Open N/A N/A Wound Recurrence: No N/A N/A  Clustered Wound: Yes N/A N/A Measurements L x W x D (cm) 1.7x1x0.1 N/A N/A Area (cm) : 1.335 N/A N/A Volume (cm) : 0.134 N/A N/A % Reduction in Area: 97.00% N/A N/A % Reduction in Volume: 98.50% N/A N/A Classification: Full Thickness Without Exposed N/A N/A Support Structures Exudate Amount: Medium N/A N/A Exudate Type: Serosanguineous N/A N/A Exudate Color: red, brown N/A N/A Granulation Amount: Large (67-100%) N/A N/A Necrotic Amount: Small (1-33%) N/A N/A Exposed Structures: Fat Layer (Subcutaneous Tissue): N/A N/A Yes Fascia: No Tendon: No Muscle: No Joint: No Bone: No Epithelialization: Medium (34-66%) N/A N/A Treatment Notes Electronic Signature(s) Signed: 11/17/2021 8:44:03 PM By: Carlene Coria RN Entered By: Carlene Coria on 11/16/2021 12:46:45 Kruse, Deionna Mamie Nick ([id number]) -------------------------------------------------------------------------------- Wake Details Patient Name: Maria Cloud P. Date of Service: 11/16/2021 12:30 PM Medical Record Number: [id number] Patient Account Number: [id number] Date of Birth/Sex: 1965-07-25 (56 y.o. F) Treating RN: Carlene Coria Primary Care Rejoice Heatwole: Alma Friendly Other Clinician: Referring Jabre Heo: Alma Friendly Treating Saidah Kempton/Extender: Yaakov Guthrie in Treatment: 2 Active  Inactive Abuse / Safety / Falls / Self Care Management Nursing Diagnoses: Potential for falls Goals: Patient/caregiver will verbalize understanding of skin care regimen Date Initiated: 11/02/2021 Target Resolution Date: 12/03/2021 Goal Status: Active Interventions: Assess Activities of Daily Living upon admission and as needed Assess fall risk on admission and as needed Assess: immobility, friction, shearing, incontinence upon admission and as needed Assess impairment of mobility on admission and as needed per policy Assess personal safety and home safety (as indicated) on admission and as needed Assess self care needs on admission and as needed Notes: Wound/Skin Impairment Nursing Diagnoses: Knowledge deficit related to ulceration/compromised skin integrity Goals: Patient/caregiver will verbalize understanding of skin care regimen Date Initiated: 11/02/2021 Target Resolution Date: 12/03/2021 Goal Status: Active Ulcer/skin breakdown will have a volume reduction of 30% by week 4 Date Initiated: 11/02/2021 Target Resolution Date: 01/02/2022 Goal Status: Active Ulcer/skin breakdown will have a volume reduction of 50% by week 8 Date Initiated: 11/02/2021 Target Resolution Date: 02/02/2022 Goal Status: Active Ulcer/skin breakdown will have a volume reduction of 80% by week 12 Date Initiated: 11/02/2021 Target Resolution Date: 03/04/2022 Goal Status: Active Ulcer/skin breakdown will heal within 14 weeks Date Initiated: 11/02/2021 Target Resolution Date: 04/04/2022 Goal Status: Active Interventions: Assess patient/caregiver ability to obtain necessary supplies Assess patient/caregiver ability to perform ulcer/skin care regimen upon admission and as needed Assess ulceration(s) every visit Notes: Electronic Signature(s) Signed: 11/17/2021 8:44:03 PM By: Carlene Coria RN Entered By: Carlene Coria on 11/16/2021 12:46:37 [name] ([id number]) Rossell, Killian P.  ([id number]) -------------------------------------------------------------------------------- Pain Assessment Details Patient Name: Maria Cloud P. Date of Service: 11/16/2021 12:30 PM Medical Record Number: [id number] Patient Account Number: [id number] Date of Birth/Sex: Apr 29, 1965 (56 y.o. F) Treating RN: Carlene Coria Primary Care Seleta Hovland: Alma Friendly Other Clinician: Referring Mercedes Fort: Alma Friendly Treating Kawon Willcutt/Extender: Yaakov Guthrie in Treatment: 2 Active Problems Location of Pain Severity and Description of Pain Patient Has Paino No Site Locations Pain Management and Medication Current Pain Management: Electronic Signature(s) Signed: 11/17/2021 8:44:03 PM By: Carlene Coria RN Entered By: Carlene Coria on 11/16/2021 12:40:50 Cragg, Katherine Roan ([id number]) -------------------------------------------------------------------------------- Patient/Caregiver Education Details Patient Name: Maria Cloud P. Date of Service: 11/16/2021 12:30 PM Medical Record Number: [id number] Patient Account Number: [id number] Date of Birth/Gender: 06-11-1965 (56 y.o. F) Treating RN: Carlene Coria Primary Care Physician: Alma Friendly Other Clinician: Referring Physician: Alma Friendly Treating Physician/Extender: Yaakov Guthrie in Treatment: 2 Education Assessment Education Provided To: Patient Education  Topics Provided Wound/Skin Impairment: Methods: Explain/Verbal Responses: State content correctly Electronic Signature(s) Signed: 11/17/2021 8:44:03 PM By: Carlene Coria RN Entered By: Carlene Coria on 11/16/2021 12:59:00 Baldridge, Tiea P. ([id number]) -------------------------------------------------------------------------------- Wound Assessment Details Patient Name: Maria Cloud P. Date of Service: 11/16/2021 12:30 PM Medical Record Number: [id number] Patient Account Number: [id number] Date of Birth/Sex: Nov 02, 1965 (56 y.o. F) Treating RN: Carlene Coria Primary Care Natoya Viscomi: Alma Friendly Other Clinician: Referring Kortney Potvin: Alma Friendly Treating Tyja Gortney/Extender: Yaakov Guthrie in Treatment: 2 Wound Status Wound Number: 2 Primary 2nd degree Burn Etiology: Wound Location: Left, Anterior Foot Wound Open Wounding Event: Thermal Burn Status: Date Acquired: 10/29/2021 Comorbid Lymphedema, Asthma, Sleep Apnea, Hypertension, Weeks Of Treatment: 2 History: Colitis, Type II Diabetes, Osteoarthritis, Neuropathy Clustered Wound: Yes Photos Wound Measurements Length: (cm) 1.7 Width: (cm) 1 Depth: (cm) 0.1 Area: (cm) 1.335 Volume: (cm) 0.134 % Reduction in Area: 97% % Reduction in Volume: 98.5% Epithelialization: Medium (34-66%) Tunneling: No Undermining: No Wound Description Classification: Full Thickness Without Exposed Support Structures Exudate Amount: Medium Exudate Type: Serosanguineous Exudate Color: red, brown Foul Odor After Cleansing: No Slough/Fibrino Yes Wound Bed Granulation Amount: Large (67-100%) Exposed Structure Necrotic Amount: Small (1-33%) Fascia Exposed: No Necrotic Quality: Adherent Slough Fat Layer (Subcutaneous Tissue) Exposed: Yes Tendon Exposed: No Muscle Exposed: No Joint Exposed: No Bone Exposed: No Treatment Notes Wound #2 (Foot) Wound Laterality: Left, Anterior Cleanser [place]ram Ancillary Kit - 15 Day Supply Discharge Instruction: Use supplies as instructed; Kit contains: (15) Saline Bullets; (15) 3x3 Gauze; 15 pr Gloves Soap and Water Discharge Instruction: Gently cleanse wound with antibacterial soap, rinse and pat dry prior to dressing wounds Kathol, Lily P. ([id number]) Wound Cleanser Discharge Instruction: Wash your hands with soap and water. Remove old dressing, discard into plastic bag and place into trash. Cleanse the wound with Wound Cleanser prior to applying a clean dressing using gauze sponges, not tissues or cotton balls. Do not scrub or use excessive  force. Pat dry using gauze sponges, not tissue or cotton balls. Peri-Wound Care AandD Ointment Discharge Instruction: Apply AandD Ointment as directed Topical Primary Dressing Xeroform 5x9-HBD (in/in) Discharge Instruction: Apply Xeroform 5x9-HBD (in/in) as directed Secondary Dressing Kerlix 4.5 x 4.1 (in/yd) Discharge Instruction: Apply Kerlix 4.5 x 4.1 (in/yd) as instructed Secured With Medipore Tape - 71M Medipore H Soft Cloth Surgical Tape, 2x2 (in/yd) Compression Wrap Compression Stockings Add-Ons Electronic Signature(s) Signed: 11/17/2021 8:44:03 PM By: Carlene Coria RN Entered By: Carlene Coria on 11/16/2021 12:45:37 Bultman, Derriana P. ([id number]) -------------------------------------------------------------------------------- Vitals Details Patient Name: Maria Cloud P. Date of Service: 11/16/2021 12:30 PM Medical Record Number: [id number] Patient Account Number: [id number] Date of Birth/Sex: 01-26-1966 (56 y.o. F) Treating RN: Carlene Coria Primary Care Jasmine Maceachern: Alma Friendly Other Clinician: Referring Thoma Paulsen: Alma Friendly Treating Sanjana Folz/Extender: Yaakov Guthrie in Treatment: 2 Vital Signs Time Taken: 12:40 Temperature (F): 98.2 Height (in): 67 Pulse (bpm): 96 Weight (lbs): 311 Respiratory Rate (breaths/min): 18 Body Mass Index (BMI): 48.7 Blood Pressure (mmHg): 137/79 Reference Range: 80 - 120 mg / dl Electronic Signature(s) Signed: 11/17/2021 8:44:03 PM By: Carlene Coria RN Entered By: Carlene Coria on 11/16/2021 12:40:41

## 2021-11-18 NOTE — Progress Notes (Signed)
MERCEDEZ, BOULE ([id number]) Visit Report for 11/16/2021 Chief Complaint Document Details Patient Name: [name], [name]. Date of Service: 11/16/2021 12:30 PM Medical Record Number: [id number] Patient Account Number: [id number] Date of Birth/Sex: 02-27-1966 (56 y.o. F) Treating RN: Carlene Coria Primary Care Provider: Alma Friendly Other Clinician: Referring Provider: Alma Friendly Treating Provider/Extender: Yaakov Guthrie in Treatment: 2 Information Obtained from: Patient Chief Complaint 06/06/2021; Left 2nd toe ulcer 11/02/2021; Sunburn to the left foot Electronic Signature(s) Signed: 11/16/2021 1:04:39 PM By: [name] DO Entered By: [name] on 11/16/2021 13:01:06 Delacruz, Jabria P. ([id number]) -------------------------------------------------------------------------------- HPI Details Patient Name: Maria Cloud P. Date of Service: 11/16/2021 12:30 PM Medical Record Number: [id number] Patient Account Number: [id number] Date of Birth/Sex: 1965-10-20 (56 y.o. F) Treating RN: Carlene Coria Primary Care Provider: Alma Friendly Other Clinician: Referring Provider: Alma Friendly Treating Provider/Extender: Yaakov Guthrie in Treatment: 2 History of Present Illness HPI Description: 06/06/2021 upon evaluation patient appears to be doing pretty well all things considered with regard to the wound on her left second toe. This is on the proximal interphalangeal joint. Subsequently she tells me that this is something that she noted for somewhere around mid January as best she can remember. Currently she has been using mupirocin ointment and at times soaking this with Epsom salt but overall has not had any true treatment of the area. She does note having a hemoglobin A1c of 5.25 February 2021 although this is [facility] I could not find that directly but that was what she reported back to [place]. Overall I am very pleased with where things stand though I  think she could potentially benefit from Santyl to try to clean up the last little bit of necrotic tissue of the central portion of the wound to allow this to heal more effectively. Patient does have a history of hypertension, diabetes mellitus type 2, and again this appears to have been a pressure injury initially although right now she is just using what she called yoga socks with a grip on the bottom which I think is perfect. 06/14/2021 upon evaluation today patient appears to be doing well with regard to her toe and actually very pleased with where things stand and I think she is making excellent progress here. Admission 11/02/2021 Ms. Kamirah [name] is a 56 year old female with a past medical history of controlled type 2 diabetes on oral agents and insulin and morbid obesity that presents to the clinic for a 1-2-week history of some burn to the left foot. She has developed blistering and subsequently open wounds. [place] her feet were likely exposed to the sun. She has been using Silvadene to the area. She currently denies signs of infection. 8/23; patient presents for follow. She has been using Santyl directly to the area of nonviable tissue and Xeroform to the area of skin breakdown. She has no issues or complaints today. She reports improvement in wound healing. She denies signs of infection. 8/30; patient presents for follow-up. Patient has been using Santyl to the wound beds. She has no issues or complaints today. She reports improvement in wound healing. Electronic Signature(s) Signed: 11/16/2021 1:04:39 PM By: [name] DO Entered By: [name] on 11/16/2021 13:01:30 [name] ([id number]) -------------------------------------------------------------------------------- Physical Exam Details Patient Name: Maria Cloud P. Date of Service: 11/16/2021 12:30 PM Medical Record Number: [id number] Patient Account  Number: [id number] Date of Birth/Sex: 1965-05-23 (56 y.o. F) Treating RN: Epps,  Morey Hummingbird Primary Care Provider: Alma Friendly Other Clinician: Referring Provider: Alma Friendly Treating Provider/Extender: Yaakov Guthrie in Treatment: 2 Constitutional . Cardiovascular . Psychiatric . Notes Left foot: To the dorsal aspect there is an open wound with granulation tissue throughout. Electronic Signature(s) Signed: 11/16/2021 1:04:39 PM By: [name] DO Entered By: [name] on 11/16/2021 13:02:15 Eddins, Katherine Roan ([id number]) -------------------------------------------------------------------------------- Physician Orders Details Patient Name: Maria Cloud P. Date of Service: 11/16/2021 12:30 PM Medical Record Number: [id number] Patient Account Number: [id number] Date of Birth/Sex: 1965/04/03 (56 y.o. F) Treating RN: Carlene Coria Primary Care Provider: Alma Friendly Other Clinician: Referring Provider: Alma Friendly Treating Provider/Extender: Yaakov Guthrie in Treatment: 2 Verbal / Phone Orders: No Diagnosis Coding Follow-up Appointments o Return Appointment in 1 week. Bathing/ Shower/ Hygiene o Wash wounds with antibacterial soap and water. Anesthetic (Use 'Patient Medications' Section for Anesthetic Order Entry) o Lidocaine applied to wound bed Wound Treatment Wound #2 - Foot Wound Laterality: Left, Anterior Cleanser: Byram Ancillary Kit - 15 Day Supply (Generic) 1 x Per Day/30 Days Discharge Instructions: Use supplies as instructed; Kit contains: (15) Saline Bullets; (15) 3x3 Gauze; 15 pr Gloves Cleanser: Soap and Water 1 x Per Day/30 Days Discharge Instructions: Gently cleanse wound with antibacterial soap, rinse and pat dry prior to dressing wounds Cleanser: Wound Cleanser 1 x Per Day/30 Days Discharge Instructions: Wash your hands with soap and water. Remove old dressing, discard into plastic bag and place into trash. Cleanse the  wound with Wound Cleanser prior to applying a clean dressing using gauze sponges, not tissues or cotton balls. Do not scrub or use excessive force. Pat dry using gauze sponges, not tissue or cotton balls. Peri-Wound Care: AandD Ointment 1 x Per Day/30 Days Discharge Instructions: Apply AandD Ointment as directed Primary Dressing: Xeroform 5x9-HBD (in/in) 1 x Per Day/30 Days Discharge Instructions: Apply Xeroform 5x9-HBD (in/in) as directed Secondary Dressing: Kerlix 4.5 x 4.1 (in/yd) (Generic) 1 x Per Day/30 Days Discharge Instructions: Apply Kerlix 4.5 x 4.1 (in/yd) as instructed Secured With: Medipore Tape - 63M Medipore H Soft Cloth Surgical Tape, 2x2 (in/yd) (Generic) 1 x Per Day/30 Days Electronic Signature(s) Signed: 11/16/2021 1:04:39 PM By: [name] DO Entered By: [name] on 11/16/2021 13:03:52 Molder, Manya P. ([id number]) -------------------------------------------------------------------------------- Problem List Details Patient Name: Maria Cloud P. Date of Service: 11/16/2021 12:30 PM Medical Record Number: [id number] Patient Account Number: [id number] Date of Birth/Sex: January 12, 1966 (56 y.o. F) Treating RN: Carlene Coria Primary Care Provider: Alma Friendly Other Clinician: Referring Provider: Alma Friendly Treating Provider/Extender: Yaakov Guthrie in Treatment: 2 Active Problems ICD-10 Encounter Code Description Active Date MDM Diagnosis T25.222A Burn of second degree of left foot, initial encounter 11/02/2021 No Yes E11.621 Type 2 diabetes mellitus with foot ulcer 11/02/2021 No Yes Inactive Problems Resolved Problems Electronic Signature(s) Signed: 11/16/2021 1:04:39 PM By: [name] DO Entered By: [name] on 11/16/2021 12:59:58 Fountain, Anai P. ([id number]) -------------------------------------------------------------------------------- Progress Note Details Patient Name: Maria Cloud P. Date of Service: 11/16/2021 12:30  PM Medical Record Number: [id number] Patient Account Number: [id number] Date of Birth/Sex: 1966-01-28 (56 y.o. F) Treating RN: Carlene Coria Primary Care Provider: Alma Friendly Other Clinician: Referring Provider: Alma Friendly Treating Provider/Extender: Yaakov Guthrie in Treatment: 2 Subjective Chief Complaint Information obtained from Patient 06/06/2021; Left 2nd toe ulcer 11/02/2021; Sunburn to the left foot History of Present Illness (HPI) 06/06/2021 upon evaluation patient appears to be doing pretty well all things considered with regard to the wound on her left second toe. This  is on the proximal interphalangeal joint. Subsequently she tells me that this is something that she noted for somewhere around mid January as best she can remember. Currently she has been using mupirocin ointment and at times soaking this with Epsom salt but overall has not had any true treatment of the area. She does note having a hemoglobin A1c of 5.25 February 2021 although this [facility] I could not find that directly but that was what she reported back to [place]. Overall I am very pleased with where things stand though I think she could potentially benefit from Santyl to try to clean up the last little bit of necrotic tissue of the central portion of the wound to allow this to heal more effectively. Patient does have a history of hypertension, diabetes mellitus type 2, and again this appears to have been a pressure injury initially although right now she is just using what she called yoga socks with a grip on the bottom which I think is perfect. 06/14/2021 upon evaluation today patient appears to be doing well with regard to her toe and actually very pleased with where things stand and I think she is making excellent progress here. Admission 11/02/2021 Ms. Maegan Buller is a 56 year old female with a past medical history of controlled type 2 diabetes on oral agents and insulin and morbid  obesity that presents to the clinic for a 1-2-week history of some burn to the left foot. She has developed blistering and subsequently open wounds. [place] however her feet were likely exposed to the sun. She has been using Silvadene to the area. She currently denies signs of infection. 8/23; patient presents for follow. She has been using Santyl directly to the area of nonviable tissue and Xeroform to the area of skin breakdown. She has no issues or complaints today. She reports improvement in wound healing. She denies signs of infection. 8/30; patient presents for follow-up. Patient has been using Santyl to the wound beds. She has no issues or complaints today. She reports improvement in wound healing. Objective Constitutional Vitals Time Taken: 12:40 PM, Height: 67 in, Weight: 311 lbs, BMI: 48.7, Temperature: 98.2 F, Pulse: 96 bpm, Respiratory Rate: 18 breaths/min, Blood Pressure: 137/79 mmHg. General Notes: Left foot: To the dorsal aspect there is an open wound with granulation tissue throughout. Integumentary (Hair, Skin) Wound #2 status is Open. Original cause of wound was Thermal Burn. The date acquired was: 10/29/2021. The wound has been in treatment 2 weeks. The wound is located on the [address]. The wound measures 1.7cm length x 1cm width x 0.1cm depth; 1.335cm^2 area and 0.134cm^3 volume. There is Fat Layer (Subcutaneous Tissue) exposed. There is no tunneling or undermining noted. There is a medium amount of serosanguineous drainage noted. There is large (67-100%) granulation within the wound bed. There is a small (1-33%) amount of necrotic tissue within the wound bed including Adherent Slough. Assessment Active Problems TRISHIA, CUTHRELL. ([id number]) ICD-10 Burn of second degree of left foot, initial encounter Type 2 diabetes mellitus with foot ulcer Patient's wound has shown improvement in size) since last clinic visit.  She now has a small remaining wound on the dorsal aspect of the left foot. At this time she can stop Santyl and just use Xeroform to this area. Follow-up in 1 week. Plan Follow-up Appointments: Return Appointment in 1 week. Bathing/ Shower/ Hygiene: Wash wounds with antibacterial soap and water. Anesthetic (Use 'Patient  Medications' Section for Anesthetic Order Entry): Lidocaine applied to wound bed WOUND #2: - Foot Wound Laterality: Left, Anterior Cleanser: Byram Ancillary Kit - 15 Day Supply (Generic) 1 x Per Day/30 Days Discharge Instructions: Use supplies as instructed; Kit contains: (15) Saline Bullets; (15) 3x3 Gauze; 15 pr Gloves Cleanser: Soap and Water 1 x Per Day/30 Days Discharge Instructions: Gently cleanse wound with antibacterial soap, rinse and pat dry prior to dressing wounds Cleanser: Wound Cleanser 1 x Per Day/30 Days Discharge Instructions: Wash your hands with soap and water. Remove old dressing, discard into plastic bag and place into trash. Cleanse the wound with Wound Cleanser prior to applying a clean dressing using gauze sponges, not tissues or cotton balls. Do not scrub or use excessive force. Pat dry using gauze sponges, not tissue or cotton balls. Peri-Wound Care: AandD Ointment 1 x Per Day/30 Days Discharge Instructions: Apply AandD Ointment as directed Primary Dressing: Xeroform 5x9-HBD (in/in) 1 x Per Day/30 Days Discharge Instructions: Apply Xeroform 5x9-HBD (in/in) as directed Secondary Dressing: Kerlix 4.5 x 4.1 (in/yd) (Generic) 1 x Per Day/30 Days Discharge Instructions: Apply Kerlix 4.5 x 4.1 (in/yd) as instructed Secured With: Medipore Tape - 15M Medipore H Soft Cloth Surgical Tape, 2x2 (in/yd) (Generic) 1 x Per Day/30 Days 1. Xeroform 2. Follow-up in 1 week Electronic Signature(s) Signed: 11/16/2021 1:04:39 PM By: [name] DO Entered By: [name] on 11/16/2021 13:03:03 Wieser, Katherine Roan  ([id number]) -------------------------------------------------------------------------------- ROS/PFSH Details Patient Name: Maria Cloud P. Date of Service: 11/16/2021 12:30 PM Medical Record Number: [id number] Patient Account Number: [id number] Date of Birth/Sex: [birth year] (56 y.o. F) Treating RN: Carlene Coria Primary Care Provider: Alma Friendly Other Clinician: Referring Provider: Alma Friendly Treating Provider/Extender: Yaakov Guthrie in Treatment: 2 Information Obtained From Patient Hematologic/Lymphatic Medical History: Positive for: Lymphedema Respiratory Medical History: Positive for: Asthma; Sleep Apnea Cardiovascular Medical History: Positive for: Hypertension Gastrointestinal Medical History: Positive for: Colitis Endocrine Medical History: Positive for: Type II Diabetes Time with diabetes: 1997 Treated with: Insulin, Oral agents Blood sugar tested every day: Yes Tested : Dexcom Blood sugar testing results: Breakfast: 226 Musculoskeletal Medical History: Positive for: Osteoarthritis Neurologic Medical History: Positive for: Neuropathy Immunizations Pneumococcal Vaccine: Received Pneumococcal Vaccination: Yes Received Pneumococcal Vaccination On or After 60th Birthday: No Implantable Devices None Family and Social History Never smoker; Marital Status - Married; Alcohol Use: Rarely; Drug Use: No History; Caffeine Use: Daily Electronic Signature(s) CHENNEL, OLIVOS ([id number]) Signed: 11/16/2021 1:04:39 PM By: [name] DO Signed: 11/17/2021 8:44:03 PM By: Carlene Coria RN Entered By: [name] on 11/16/2021 13:04:05 Esselman, Laparis P. ([id number]) -------------------------------------------------------------------------------- SuperBill Details Patient Name: Maria Cloud P. Date of Service: 11/16/2021 Medical Record Number: [id number] Patient Account Number: [id number] Date of Birth/Sex: [birth year] (56 y.o. F) Treating RN:  Carlene Coria Primary Care Provider: Alma Friendly Other Clinician: Referring Provider: Alma Friendly Treating Provider/Extender: Yaakov Guthrie in Treatment: 2 Diagnosis Coding ICD-10 Codes Code Description T25.222A Burn of second degree of left foot, initial encounter E11.621 Type 2 diabetes mellitus with foot ulcer Facility Procedures CPT4 Code: [id number] Description: [phone] - WOUND CARE VISIT-LEV 2 EST PT Modifier: Quantity: 1 Physician Procedures CPT4 Code: [id number] Description: [id number] - WC PHYS LEVEL 3 - EST PT Modifier: Quantity: 1 CPT4 Code: Description: ICD-10 Diagnosis Description T25.222A Burn of second degree of left foot, initial encounter E11.621 Type 2 diabetes mellitus with foot ulcer Modifier: Quantity: Electronic Signature(s) Signed: 11/16/2021 1:04:39 PM By: [name] DO Entered By: [name] on 11/16/2021 13:03:16

## 2021-11-23 ENCOUNTER — Encounter: Payer: Medicare PPO | Attending: Internal Medicine | Admitting: Internal Medicine

## 2021-11-23 DIAGNOSIS — I10 Essential (primary) hypertension: Secondary | ICD-10-CM | POA: Insufficient documentation

## 2021-11-23 DIAGNOSIS — E11621 Type 2 diabetes mellitus with foot ulcer: Secondary | ICD-10-CM | POA: Diagnosis not present

## 2021-11-23 DIAGNOSIS — L559 Sunburn, unspecified: Secondary | ICD-10-CM | POA: Insufficient documentation

## 2021-11-23 DIAGNOSIS — L97529 Non-pressure chronic ulcer of other part of left foot with unspecified severity: Secondary | ICD-10-CM | POA: Insufficient documentation

## 2021-11-23 DIAGNOSIS — T25222A Burn of second degree of left foot, initial encounter: Secondary | ICD-10-CM | POA: Diagnosis not present

## 2021-11-23 DIAGNOSIS — Z6841 Body Mass Index (BMI) 40.0 and over, adult: Secondary | ICD-10-CM | POA: Insufficient documentation

## 2021-11-28 NOTE — Progress Notes (Signed)
[name] ([id number]) Visit Report for 11/23/2021 Arrival Information Details Patient Name: [name], [name]. Date of Service: 11/23/2021 8:00 AM Medical Record Number: [id number] Patient Account Number: [id number] Date of Birth/Sex: March 26, 1965 (56 y.o. F) Treating RN: Carlene Coria Primary Care Venson Ferencz: Alma Friendly Other Clinician: Referring Ece Cumberland: Alma Friendly Treating Marcelene Weidemann/Extender: Yaakov Guthrie in Treatment: 3 Visit Information History Since Last Visit All ordered tests and consults were completed: No Patient Arrived: Wheel Chair Added or deleted any medications: No Arrival Time: 07:58 Any new allergies or adverse reactions: No Accompanied By: husband Had a fall or experienced change in No Transfer Assistance: None activities of daily living that may affect Patient Identification Verified: Yes risk of falls: Secondary Verification Process Completed: Yes Signs or symptoms of abuse/neglect since last visito No Patient Requires Transmission-Based Precautions: No Hospitalized since last visit: No Patient Has Alerts: No Implantable device outside of the clinic excluding No cellular tissue based products placed in the center since last visit: Has Dressing in Place as Prescribed: Yes Pain Present Now: No Electronic Signature(s) Signed: 11/28/2021 8:13:04 AM By: Carlene Coria RN Entered By: Carlene Coria on 11/23/2021 08:03:22 Lama, Katherine Roan ([id number]) -------------------------------------------------------------------------------- Clinic Level of Care Assessment Details Patient Name: Maria Cloud P. Date of Service: 11/23/2021 8:00 AM Medical Record Number: [id number] Patient Account Number: [id number] Date of Birth/Sex: August 24, 1965 (56 y.o. F) Treating RN: Carlene Coria Primary Care Audric Venn: Alma Friendly Other Clinician: Referring Luwanda Starr: Alma Friendly Treating Timi Reeser/Extender: Yaakov Guthrie in Treatment: 3 Clinic Level of  Care Assessment Items TOOL 4 Quantity Score X - Use when only an EandM is performed on FOLLOW-UP visit 1 0 ASSESSMENTS - Nursing Assessment / Reassessment X - Reassessment of Co-morbidities (includes updates in patient status) 1 10 X- 1 5 Reassessment of Adherence to Treatment Plan ASSESSMENTS - Wound and Skin Assessment / Reassessment X - Simple Wound Assessment / Reassessment - one wound 1 5 []  - 0 Complex Wound Assessment / Reassessment - multiple wounds []  - 0 Dermatologic / Skin Assessment (not related to wound area) ASSESSMENTS - Focused Assessment []  - Circumferential Edema Measurements - multi extremities 0 []  - 0 Nutritional Assessment / Counseling / Intervention []  - 0 Lower Extremity Assessment (monofilament, tuning fork, pulses) []  - 0 Peripheral Arterial Disease Assessment (using hand held doppler) ASSESSMENTS - Ostomy and/or Continence Assessment and Care []  - Incontinence Assessment and Management 0 []  - 0 Ostomy Care Assessment and Management (repouching, etc.) PROCESS - Coordination of Care X - Simple Patient / Family Education for ongoing care 1 15 []  - 0 Complex (extensive) Patient / Family Education for ongoing care []  - 0 Staff obtains Programmer, systems, Records, Test Results / Process Orders []  - 0 Staff telephones HHA, Nursing Homes / Clarify orders / etc []  - 0 Routine Transfer to another Facility (non-emergent condition) []  - 0 Routine Hospital Admission (non-emergent condition) []  - 0 New Admissions / Biomedical engineer / Ordering NPWT, Apligraf, etc. []  - 0 Emergency Hospital Admission (emergent condition) X- 1 10 Simple Discharge Coordination []  - 0 Complex (extensive) Discharge Coordination PROCESS - Special Needs []  - Pediatric / Minor Patient Management 0 []  - 0 Isolation Patient Management []  - 0 Hearing / Language / Visual special needs []  - 0 Assessment of Community assistance (transportation, D/C planning, etc.) []  - 0 Additional  assistance / Altered mentation []  - 0 Support Surface(s) Assessment (bed, cushion, seat, etc.) INTERVENTIONS - Wound Cleansing / Measurement Callies, Lasya P. ([id number]) X- 1 5 Simple Wound Cleansing -  one wound $RemoveB'[]'nGpgrTiU$  - 0 Complex Wound Cleansing - multiple wounds X- 1 5 Wound Imaging (photographs - any number of wounds) $RemoveBe'[]'QxWArKOUf$  - 0 Wound Tracing (instead of photographs) X- 1 5 Simple Wound Measurement - one wound $RemoveB'[]'suPCwSWu$  - 0 Complex Wound Measurement - multiple wounds INTERVENTIONS - Wound Dressings X - Small Wound Dressing one or multiple wounds 1 10 $Re'[]'vhn$  - 0 Medium Wound Dressing one or multiple wounds $RemoveBeforeD'[]'FgrunCFuuDsFYT$  - 0 Large Wound Dressing one or multiple wounds $RemoveBeforeD'[]'moMTqlEeoEvojH$  - 0 Application of Medications - topical $RemoveB'[]'fjhCMHpD$  - 0 Application of Medications - injection INTERVENTIONS - Miscellaneous $RemoveBeforeD'[]'NiDTDYtZITncFB$  - External ear exam 0 $Remo'[]'ZxmEP$  - 0 Specimen Collection (cultures, biopsies, blood, body fluids, etc.) $RemoveBefor'[]'LDRmFTOQzkot$  - 0 Specimen(s) / Culture(s) sent or taken to Lab for analysis $RemoveBefo'[]'mYMvxthJKpQ$  - 0 Patient Transfer (multiple staff / Civil Service fast streamer / Similar devices) $RemoveBeforeDE'[]'ZZljiXsQxSiawGY$  - 0 Simple Staple / Suture removal (25 or less) $Remove'[]'IdBEnvY$  - 0 Complex Staple / Suture removal (26 or more) $Remove'[]'EyIjtGw$  - 0 Hypo / Hyperglycemic Management (close monitor of Blood Glucose) $RemoveBefore'[]'hlXcmGHWZNFnm$  - 0 Ankle / Brachial Index (ABI) - do not check if billed separately X- 1 5 Vital Signs Has the patient been seen at the hospital within the last three years: Yes Total Score: 75 Level Of Care: New/Established - Level 2 Electronic Signature(s) Signed: 11/28/2021 8:13:04 AM By: Carlene Coria RN Entered By: Carlene Coria on 11/23/2021 08:28:54 Keagle, Katherine Roan ([id number]) -------------------------------------------------------------------------------- Encounter Discharge Information Details Patient Name: Maria Cloud P. Date of Service: 11/23/2021 8:00 AM Medical Record Number: [id number] Patient Account Number: [id number] Date of Birth/Sex: Jun 16, 1965 (56 y.o. F) Treating RN: Carlene Coria Primary Care  Cotton Beckley: Alma Friendly Other Clinician: Referring Kaiyon Hynes: Alma Friendly Treating Norvella Loscalzo/Extender: Yaakov Guthrie in Treatment: 3 Encounter Discharge Information Items Discharge Condition: Stable Ambulatory Status: Wheelchair Discharge Destination: Home Transportation: Private Auto Accompanied By: husband Schedule Follow-up Appointment: Yes Clinical Summary of Care: Electronic Signature(s) Signed: 11/28/2021 8:13:04 AM By: Carlene Coria RN Entered By: Carlene Coria on 11/23/2021 08:29:45 Saccente, Katherine Roan ([id number]) -------------------------------------------------------------------------------- Lower Extremity Assessment Details Patient Name: Maria Cloud P. Date of Service: 11/23/2021 8:00 AM Medical Record Number: [id number] Patient Account Number: [id number] Date of Birth/Sex: Aug 04, 1965 (56 y.o. F) Treating RN: Carlene Coria Primary Care Anayeli Arel: Alma Friendly Other Clinician: Referring Leilah Polimeni: Alma Friendly Treating Dorothea Yow/Extender: Yaakov Guthrie in Treatment: 3 Edema Assessment Assessed: [Left: No] [Right: No] Edema: [Left: Ye] [Right: s] Calf Left: Right: Point of Measurement: 30 cm From Medial Instep 50 cm Ankle Left: Right: Point of Measurement: 10 cm From Medial Instep 25 cm Vascular Assessment Pulses: Dorsalis Pedis Palpable: [Left:Yes] Electronic Signature(s) Signed: 11/28/2021 8:13:04 AM By: Carlene Coria RN Entered By: Carlene Coria on 11/23/2021 08:09:46 Loschiavo, Mekia P. ([id number]) -------------------------------------------------------------------------------- Multi Wound Chart Details Patient Name: Maria Cloud P. Date of Service: 11/23/2021 8:00 AM Medical Record Number: [id number] Patient Account Number: [id number] Date of Birth/Sex: 08/29/1965 (56 y.o. F) Treating RN: Carlene Coria Primary Care Drexler Maland: Alma Friendly Other Clinician: Referring Mickael Mcnutt: Alma Friendly Treating Piercen Covino/Extender: Yaakov Guthrie in Treatment: 3 Vital Signs Height(in): 67 Pulse(bpm): 94 Weight(lbs): 311 Blood Pressure(mmHg): 167/80 Body Mass Index(BMI): 48.7 Temperature(F): 97.7 Respiratory Rate(breaths/min): 18 Photos: [N/A:N/A] Wound Location: Left, Anterior Foot N/A N/A Wounding Event: Thermal Burn N/A N/A Primary Etiology: 2nd degree Burn N/A N/A Comorbid History: Lymphedema, Asthma, Sleep N/A N/A Apnea, Hypertension, Colitis, Type II Diabetes, Osteoarthritis, Neuropathy Date Acquired: 10/29/2021 N/A N/A Weeks of Treatment: 3 N/A N/A Wound Status: Open N/A N/A Wound Recurrence: No N/A N/A  Clustered Wound: Yes N/A N/A Measurements L x W x D (cm) 0.3x0.5x0.1 N/A N/A Area (cm) : 0.118 N/A N/A Volume (cm) : 0.012 N/A N/A % Reduction in Area: 99.70% N/A N/A % Reduction in Volume: 99.90% N/A N/A Classification: Full Thickness Without Exposed N/A N/A Support Structures Exudate Amount: Medium N/A N/A Exudate Type: Serosanguineous N/A N/A Exudate Color: red, brown N/A N/A Granulation Amount: Large (67-100%) N/A N/A Necrotic Amount: None Present (0%) N/A N/A Exposed Structures: Fat Layer (Subcutaneous Tissue): N/A N/A Yes Fascia: No Tendon: No Muscle: No Joint: No Bone: No Epithelialization: Medium (34-66%) N/A N/A Treatment Notes Electronic Signature(s) Signed: 11/28/2021 8:13:04 AM By: Carlene Coria RN Entered By: Carlene Coria on 11/23/2021 08:09:59 Disla, Katherine Roan ([id number]) -------------------------------------------------------------------------------- [place] Details Patient Name: Maria Cloud P. Date of Service: 11/23/2021 8:00 AM Medical Record Number: [id number] Patient Account Number: [id number] Date of Birth/Sex: 18-Jun-1965 (56 y.o. F) Treating RN: Carlene Coria Primary Care Kashina Mecum: Alma Friendly Other Clinician: Referring Anahlia Iseminger: Alma Friendly Treating Ivannia Willhelm/Extender: Yaakov Guthrie in Treatment: 3 Active Inactive Abuse  / Safety / Falls / Self Care Management Nursing Diagnoses: Potential for falls Goals: Patient/caregiver will verbalize understanding of skin care regimen Date Initiated: 11/02/2021 Target Resolution Date: 12/03/2021 Goal Status: Active Interventions: Assess Activities of Daily Living upon admission and as needed Assess fall risk on admission and as needed Assess: immobility, friction, shearing, incontinence upon admission and as needed Assess impairment of mobility on admission and as needed per policy Assess personal safety and home safety (as indicated) on admission and as needed Assess self care needs on admission and as needed Notes: Wound/Skin Impairment Nursing Diagnoses: Knowledge deficit related to ulceration/compromised skin integrity Goals: Patient/caregiver will verbalize understanding of skin care regimen Date Initiated: 11/02/2021 Target Resolution Date: 12/03/2021 Goal Status: Active Ulcer/skin breakdown will have a volume reduction of 30% by week 4 Date Initiated: 11/02/2021 Target Resolution Date: 01/02/2022 Goal Status: Active Ulcer/skin breakdown will have a volume reduction of 50% by week 8 Date Initiated: 11/02/2021 Target Resolution Date: 02/02/2022 Goal Status: Active Ulcer/skin breakdown will have a volume reduction of 80% by week 12 Date Initiated: 11/02/2021 Target Resolution Date: 03/04/2022 Goal Status: Active Ulcer/skin breakdown will heal within 14 weeks Date Initiated: 11/02/2021 Target Resolution Date: 04/04/2022 Goal Status: Active Interventions: Assess patient/caregiver ability to obtain necessary supplies Assess patient/caregiver ability to perform ulcer/skin care regimen upon admission and as needed Assess ulceration(s) every visit Notes: Electronic Signature(s) Signed: 11/28/2021 8:13:04 AM By: Carlene Coria RN Entered By: Carlene Coria on 11/23/2021 08:09:50 [name] ([id number]) Eagleson, Nakisha P.  ([id number]) -------------------------------------------------------------------------------- Pain Assessment Details Patient Name: Maria Cloud P. Date of Service: 11/23/2021 8:00 AM Medical Record Number: [id number] Patient Account Number: [id number] Date of Birth/Sex: 1965/11/12 (56 y.o. F) Treating RN: Carlene Coria Primary Care Sheryll Dymek: Alma Friendly Other Clinician: Referring Bettymae Yott: Alma Friendly Treating Esa Raden/Extender: Yaakov Guthrie in Treatment: 3 Active Problems Location of Pain Severity and Description of Pain Patient Has Paino No Site Locations Pain Management and Medication Current Pain Management: Electronic Signature(s) Signed: 11/28/2021 8:13:04 AM By: Carlene Coria RN Entered By: Carlene Coria on 11/23/2021 08:03:45 Dumas, Katherine Roan ([id number]) -------------------------------------------------------------------------------- Patient/Caregiver Education Details Patient Name: Maria Cloud P. Date of Service: 11/23/2021 8:00 AM Medical Record Number: [id number] Patient Account Number: [id number] Date of Birth/Gender: 09-03-1965 (56 y.o. F) Treating RN: Carlene Coria Primary Care Physician: Alma Friendly Other Clinician: Referring Physician: Alma Friendly Treating Physician/Extender: Yaakov Guthrie in Treatment: 3 Education Assessment Education Provided To: Patient  Education Topics Provided Wound/Skin Impairment: Methods: Explain/Verbal Responses: State content correctly Electronic Signature(s) Signed: 11/28/2021 8:13:04 AM By: Carlene Coria RN Entered By: Carlene Coria on 11/23/2021 08:29:07 [name] ([id number]) -------------------------------------------------------------------------------- Wound Assessment Details Patient Name: Maria Cloud P. Date of Service: 11/23/2021 8:00 AM Medical Record Number: [id number] Patient Account Number: [id number] Date of Birth/Sex: Aug 04, 1965 (56 y.o. F) Treating RN: Carlene Coria Primary  Care Mikah Poss: Alma Friendly Other Clinician: Referring Eldred Sooy: Alma Friendly Treating Allanna Bresee/Extender: Yaakov Guthrie in Treatment: 3 Wound Status Wound Number: 2 Primary 2nd degree Burn Etiology: Wound Location: Left, Anterior Foot Wound Open Wounding Event: Thermal Burn Status: Date Acquired: 10/29/2021 Comorbid Lymphedema, Asthma, Sleep Apnea, Hypertension, Weeks Of Treatment: 3 History: Colitis, Type II Diabetes, Osteoarthritis, Neuropathy Clustered Wound: Yes Photos Wound Measurements Length: (cm) 0.3 Width: (cm) 0.5 Depth: (cm) 0.1 Area: (cm) 0.118 Volume: (cm) 0.012 % Reduction in Area: 99.7% % Reduction in Volume: 99.9% Epithelialization: Medium (34-66%) Tunneling: No Undermining: No Wound Description Classification: Full Thickness Without Exposed Support Structures Exudate Amount: Medium Exudate Type: Serosanguineous Exudate Color: red, brown Foul Odor After Cleansing: No Slough/Fibrino Yes Wound Bed Granulation Amount: Large (67-100%) Exposed Structure Necrotic Amount: None Present (0%) Fascia Exposed: No Fat Layer (Subcutaneous Tissue) Exposed: Yes Tendon Exposed: No Muscle Exposed: No Joint Exposed: No Bone Exposed: No Treatment Notes Wound #2 (Foot) Wound Laterality: Left, Anterior Cleanser [place] Ancillary Kit - 15 Day Supply Discharge Instruction: Use supplies as instructed; Kit contains: (15) Saline Bullets; (15) 3x3 Gauze; 15 pr Gloves Soap and Water Discharge Instruction: Gently cleanse wound with antibacterial soap, rinse and pat dry prior to dressing wounds Broadwater, Zanyia P. ([id number]) Wound Cleanser Discharge Instruction: Wash your hands with soap and water. Remove old dressing, discard into plastic bag and place into trash. Cleanse the wound with Wound Cleanser prior to applying a clean dressing using gauze sponges, not tissues or cotton balls. Do not scrub or use excessive force. Pat dry using gauze sponges, not tissue  or cotton balls. Peri-Wound Care AandD Ointment Discharge Instruction: Apply AandD Ointment as directed Topical Primary Dressing Xeroform 5x9-HBD (in/in) Discharge Instruction: Apply Xeroform 5x9-HBD (in/in) as directed Secondary Dressing Kerlix 4.5 x 4.1 (in/yd) Discharge Instruction: Apply Kerlix 4.5 x 4.1 (in/yd) as instructed Secured With Medipore Tape - 8M Medipore H Soft Cloth Surgical Tape, 2x2 (in/yd) Compression Wrap Compression Stockings Add-Ons Electronic Signature(s) Signed: 11/28/2021 8:13:04 AM By: Carlene Coria RN Entered By: Carlene Coria on 11/23/2021 08:09:00 Palmisano, Katherine Roan ([id number]) -------------------------------------------------------------------------------- Vitals Details Patient Name: Maria Cloud P. Date of Service: 11/23/2021 8:00 AM Medical Record Number: [id number] Patient Account Number: [id number] Date of Birth/Sex: 05-Sep-1965 (56 y.o. F) Treating RN: Carlene Coria Primary Care Sueanne Maniaci: Alma Friendly Other Clinician: Referring Shanik Brookshire: Alma Friendly Treating Daci Stubbe/Extender: Yaakov Guthrie in Treatment: 3 Vital Signs Time Taken: 08:03 Temperature (F): 97.7 Height (in): 67 Pulse (bpm): 94 Weight (lbs): 311 Respiratory Rate (breaths/min): 18 Body Mass Index (BMI): 48.7 Blood Pressure (mmHg): 167/80 Reference Range: 80 - 120 mg / dl Electronic Signature(s) Signed: 11/28/2021 8:13:04 AM By: Carlene Coria RN Entered By: Carlene Coria on 11/23/2021 08:03:38

## 2021-11-28 NOTE — Progress Notes (Signed)
[name] ([id number]) Visit Report for 11/23/2021 Chief Complaint Document Details Patient Name: [name], [name]. Date of Service: 11/23/2021 8:00 AM Medical Record Number: [id number] Patient Account Number: [id number] Date of Birth/Sex: 09-16-1965 (56 y.o. F) Treating RN: Carlene Coria Primary Care Provider: Alma Friendly Other Clinician: Referring Provider: Alma Friendly Treating Provider/Extender: Yaakov Guthrie in Treatment: 3 Information Obtained from: Patient Chief Complaint 06/06/2021; Left 2nd toe ulcer 11/02/2021; Sunburn to the left foot Electronic Signature(s) Signed: 11/23/2021 9:07:33 AM By: [name] DO Entered By: [name] on 11/23/2021 08:52:09 [name], [name] ([id number]) -------------------------------------------------------------------------------- HPI Details Patient Name: Maria Cloud P. Date of Service: 11/23/2021 8:00 AM Medical Record Number: [id number] Patient Account Number: [id number] Date of Birth/Sex: Feb 25, 1966 (57 y.o. F) Treating RN: Carlene Coria Primary Care Provider: Alma Friendly Other Clinician: Referring Provider: Alma Friendly Treating Provider/Extender: Yaakov Guthrie in Treatment: 3 History of Present Illness HPI Description: 06/06/2021 upon evaluation patient appears to be doing pretty well all things considered with regard to the wound on her left second toe. This is on the proximal interphalangeal joint. Subsequently she tells me that this is something that she noted for somewhere around mid January as best she can remember. Currently she has been using mupirocin ointment and at times soaking this with Epsom salt but overall has not had any true treatment of the area. She does note having a hemoglobin A1c of 5.25 February 2021 although this is [facility] I could not find that directly but that was what she reported back to [place]. Overall I am very pleased with where things stand though I think  she could potentially benefit from Santyl to try to clean up the last little bit of necrotic tissue of the central portion of the wound to allow this to heal more effectively. Patient does have a history of hypertension, diabetes mellitus type 2, and again this appears to have been a pressure injury initially although right now she is just using what she called yoga socks with a grip on the bottom which I think is perfect. 06/14/2021 upon evaluation today patient appears to be doing well with regard to her toe and actually very pleased with where things stand and I think she is making excellent progress here. Admission 11/02/2021 [name] is a 56 year old female with a past medical history of controlled type 2 diabetes on oral agents and insulin and morbid obesity that presents to the clinic for a 1-2-week history of some burn to the left foot. She has developed blistering and subsequently open wounds. [place] her feet were likely exposed to the sun. She has been using Silvadene to the area. She currently denies signs of infection. 8/23; patient presents for follow. She has been using Santyl directly to the area of nonviable tissue and Xeroform to the area of skin breakdown. She has no issues or complaints today. She reports improvement in wound healing. She denies signs of infection. 8/30; patient presents for follow-up. Patient has been using Santyl to the wound beds. She has no issues or complaints today. She reports improvement in wound healing. 9/6; patient presents for follow-up. She has been using Xeroform to the wound bed and AandD ointment to the surrounding periwound. She has no issues or complaints today. She reports improvement in wound healing. Electronic Signature(s) Signed: 11/23/2021 9:07:33 AM By: [name] DO Entered By: [name] on 11/23/2021 08:52:44 [name], [name]  (  [id number]) -------------------------------------------------------------------------------- Physical Exam Details Patient Name: ABBI, [name] Date of Service: 11/23/2021 8:00 AM Medical Record Number: [id number] Patient Account Number: [id number] Date of Birth/Sex: 1965/08/10 (56 y.o. F) Treating RN: Carlene Coria Primary Care Provider: Alma Friendly Other Clinician: Referring Provider: Alma Friendly Treating Provider/Extender: Yaakov Guthrie in Treatment: 3 Constitutional . Cardiovascular . Psychiatric . Notes Left foot: To the dorsal aspect there is an open wound with granulation tissue throughout. No surrounding signs of soft tissue infection. Electronic Signature(s) Signed: 11/23/2021 9:07:33 AM By: [name] DO Entered By: [name] on 11/23/2021 08:53:19 [name], [name] ([id number]) -------------------------------------------------------------------------------- Physician Orders Details Patient Name: Maria Cloud P. Date of Service: 11/23/2021 8:00 AM Medical Record Number: [id number] Patient Account Number: [id number] Date of Birth/Sex: March 16, 1966 (56 y.o. F) Treating RN: Carlene Coria Primary Care Provider: Alma Friendly Other Clinician: Referring Provider: Alma Friendly Treating Provider/Extender: Yaakov Guthrie in Treatment: 3 Verbal / Phone Orders: No Diagnosis Coding Follow-up Appointments o Return Appointment in 1 week. Bathing/ Shower/ Hygiene o Wash wounds with antibacterial soap and water. Anesthetic (Use 'Patient Medications' Section for Anesthetic Order Entry) o Lidocaine applied to wound bed Wound Treatment Wound #2 - Foot Wound Laterality: Left, Anterior Cleanser: Byram Ancillary Kit - 15 Day Supply (Generic) 1 x Per Day/30 Days Discharge Instructions: Use supplies as instructed; Kit contains: (15) Saline Bullets; (15) 3x3 Gauze; 15 pr Gloves Cleanser: Soap and Water 1 x Per Day/30 Days Discharge  Instructions: Gently cleanse wound with antibacterial soap, rinse and pat dry prior to dressing wounds Cleanser: Wound Cleanser 1 x Per Day/30 Days Discharge Instructions: Wash your hands with soap and water. Remove old dressing, discard into plastic bag and place into trash. Cleanse the wound with Wound Cleanser prior to applying a clean dressing using gauze sponges, not tissues or cotton balls. Do not scrub or use excessive force. Pat dry using gauze sponges, not tissue or cotton balls. Peri-Wound Care: AandD Ointment 1 x Per Day/30 Days Discharge Instructions: Apply AandD Ointment as directed Primary Dressing: Xeroform 5x9-HBD (in/in) 1 x Per Day/30 Days Discharge Instructions: Apply Xeroform 5x9-HBD (in/in) as directed Secondary Dressing: Kerlix 4.5 x 4.1 (in/yd) (Generic) 1 x Per Day/30 Days Discharge Instructions: Apply Kerlix 4.5 x 4.1 (in/yd) as instructed Secured With: Medipore Tape - 107M Medipore H Soft Cloth Surgical Tape, 2x2 (in/yd) (Generic) 1 x Per Day/30 Days Electronic Signature(s) Signed: 11/23/2021 9:07:33 AM By: [name] DO Entered By: [name] on 11/23/2021 08:55:35 [name], [name] ([id number]) -------------------------------------------------------------------------------- Problem List Details Patient Name: Maria Cloud P. Date of Service: 11/23/2021 8:00 AM Medical Record Number: [id number] Patient Account Number: [id number] Date of Birth/Sex: 02-21-1966 (56 y.o. F) Treating RN: Carlene Coria Primary Care Provider: Alma Friendly Other Clinician: Referring Provider: Alma Friendly Treating Provider/Extender: Yaakov Guthrie in Treatment: 3 Active Problems ICD-10 Encounter Code Description Active Date MDM Diagnosis T25.222A Burn of second degree of left foot, initial encounter 11/02/2021 No Yes E11.621 Type 2 diabetes mellitus with foot ulcer 11/02/2021 No Yes Inactive Problems Resolved Problems Electronic Signature(s) Signed: 11/23/2021  9:07:33 AM By: [name] DO Entered By: [name] on 11/23/2021 08:52:06 [name], [name] ([id number]) -------------------------------------------------------------------------------- Progress Note Details Patient Name: Maria Cloud P. Date of Service: 11/23/2021 8:00 AM Medical Record Number: [id number] Patient Account Number: [id number] Date of Birth/Sex: [birth year] (56 y.o. F) Treating RN: Carlene Coria Primary Care Provider: Alma Friendly Other Clinician: Referring Provider: Alma Friendly Treating Provider/Extender: Yaakov Guthrie in Treatment: 3 Subjective Chief Complaint Information obtained from  Patient 06/06/2021; Left 2nd toe ulcer 11/02/2021; Sunburn to the left foot History of Present Illness (HPI) 06/06/2021 upon evaluation patient appears to be doing pretty well all things considered with regard to the wound on her left second toe. This is on the proximal interphalangeal joint. Subsequently she tells me that this is something that she noted for somewhere around mid January as best she can remember. Currently she has been using mupirocin ointment and at times soaking this with Epsom salt but overall has not had any true treatment of the area. She does note having a hemoglobin A1c of 5.25 February 2021 although this [facility] I could not find that directly but that was what she reported back to [place]. Overall I am very pleased with where things stand though I think she could potentially benefit from Santyl to try to clean up the last little bit of necrotic tissue of the central portion of the wound to allow this to heal more effectively. Patient does have a history of hypertension, diabetes mellitus type 2, and again this appears to have been a pressure injury initially although right now she is just using what she called yoga socks with a grip on the bottom which I think is perfect. 06/14/2021 upon evaluation today patient appears to be doing  well with regard to her toe and actually very pleased with where things stand and I think she is making excellent progress here. Admission 11/02/2021 [name] is a 56 year old female with a past medical history of controlled type 2 diabetes on oral agents and insulin and morbid obesity that presents to the clinic for a 1-2-week history of some burn to the left foot. She has developed blistering and subsequently open wounds. [place] however her feet were likely exposed to the sun. She has been using Silvadene to the area. She currently denies signs of infection. 8/23; patient presents for follow. She has been using Santyl directly to the area of nonviable tissue and Xeroform to the area of skin breakdown. She has no issues or complaints today. She reports improvement in wound healing. She denies signs of infection. 8/30; patient presents for follow-up. Patient has been using Santyl to the wound beds. She has no issues or complaints today. She reports improvement in wound healing. 9/6; patient presents for follow-up. She has been using Xeroform to the wound bed and AandD ointment to the surrounding periwound. She has no issues or complaints today. She reports improvement in wound healing. Objective Constitutional Vitals Time Taken: 8:03 AM, Height: 67 in, Weight: 311 lbs, BMI: 48.7, Temperature: 97.7 F, Pulse: 94 bpm, Respiratory Rate: 18 breaths/min, Blood Pressure: 167/80 mmHg. General Notes: Left foot: To the dorsal aspect there is an open wound with granulation tissue throughout. No surrounding signs of soft tissue infection. Integumentary (Hair, Skin) Wound #2 status is Open. Original cause of wound was Thermal Burn. The date acquired was: 10/29/2021. The wound has been in treatment 3 weeks. The wound is located on the [place]. The wound measures 0.3cm length x 0.5cm width x 0.1cm depth; 0.118cm^2 area and 0.012cm^3  volume. There is Fat Layer (Subcutaneous Tissue) exposed. There is no tunneling or undermining noted. There is a medium amount of serosanguineous drainage noted. There is large (67-100%) granulation within the wound bed. There is no necrotic tissue within the wound bed. [name], [name] ([id number]) Assessment Active Problems ICD-10 Burn of second degree of left  foot, initial encounter Type 2 diabetes mellitus with foot ulcer Patient's wound has shown improvement in size and appearance since last clinic visit. I recommended continuing the course with Xeroform over the wound bed and AandE ointment to the periwound. Follow-up in 1 week. I am hopeful the wound will be healed by then. Plan Follow-up Appointments: Return Appointment in 1 week. Bathing/ Shower/ Hygiene: Wash wounds with antibacterial soap and water. Anesthetic (Use 'Patient Medications' Section for Anesthetic Order Entry): Lidocaine applied to wound bed WOUND #2: - Foot Wound Laterality: Left, Anterior Cleanser: Byram Ancillary Kit - 15 Day Supply (Generic) 1 x Per Day/30 Days Discharge Instructions: Use supplies as instructed; Kit contains: (15) Saline Bullets; (15) 3x3 Gauze; 15 pr Gloves Cleanser: Soap and Water 1 x Per Day/30 Days Discharge Instructions: Gently cleanse wound with antibacterial soap, rinse and pat dry prior to dressing wounds Cleanser: Wound Cleanser 1 x Per Day/30 Days Discharge Instructions: Wash your hands with soap and water. Remove old dressing, discard into plastic bag and place into trash. Cleanse the wound with Wound Cleanser prior to applying a clean dressing using gauze sponges, not tissues or cotton balls. Do not scrub or use excessive force. Pat dry using gauze sponges, not tissue or cotton balls. Peri-Wound Care: AandD Ointment 1 x Per Day/30 Days Discharge Instructions: Apply AandD Ointment as directed Primary Dressing: Xeroform 5x9-HBD (in/in) 1 x Per Day/30 Days Discharge Instructions:  Apply Xeroform 5x9-HBD (in/in) as directed Secondary Dressing: Kerlix 4.5 x 4.1 (in/yd) (Generic) 1 x Per Day/30 Days Discharge Instructions: Apply Kerlix 4.5 x 4.1 (in/yd) as instructed Secured With: Medipore Tape - 52M Medipore H Soft Cloth Surgical Tape, 2x2 (in/yd) (Generic) 1 x Per Day/30 Days 1. Xeroform 2. AandD ointment 3. Follow-up in 1 week Electronic Signature(s) Signed: 11/23/2021 9:07:33 AM By: [name] DO Entered By: [name] on 11/23/2021 08:54:43 [name], Maria Mamie Nick ([id number]) -------------------------------------------------------------------------------- ROS/PFSH Details Patient Name: Maria Cloud P. Date of Service: 11/23/2021 8:00 AM Medical Record Number: [id number] Patient Account Number: [id number] Date of Birth/Sex: Apr 21, 1965 (56 y.o. F) Treating RN: Carlene Coria Primary Care Provider: Alma Friendly Other Clinician: Referring Provider: Alma Friendly Treating Provider/Extender: Yaakov Guthrie in Treatment: 3 Information Obtained From Patient Hematologic/Lymphatic Medical History: Positive for: Lymphedema Respiratory Medical History: Positive for: Asthma; Sleep Apnea Cardiovascular Medical History: Positive for: Hypertension Gastrointestinal Medical History: Positive for: Colitis Endocrine Medical History: Positive for: Type II Diabetes Time with diabetes: 1997 Treated with: Insulin, Oral agents Blood sugar tested every day: Yes Tested : Dexcom Blood sugar testing results: Breakfast: 226 Musculoskeletal Medical History: Positive for: Osteoarthritis Neurologic Medical History: Positive for: Neuropathy Immunizations Pneumococcal Vaccine: Received Pneumococcal Vaccination: Yes Received Pneumococcal Vaccination On or After 60th Birthday: No Implantable Devices None Family and Social History Never smoker; Marital Status - Married; Alcohol Use: Rarely; Drug Use: No History; Caffeine Use: Daily Electronic  Signature(s) [name], [name] ([id number]) Signed: 11/23/2021 9:07:33 AM By: [name] DO Signed: 11/28/2021 8:13:04 AM By: Carlene Coria RN Entered By: [name] on 11/23/2021 08:58:13 [name], Maria Mamie Nick ([id number]) -------------------------------------------------------------------------------- SuperBill Details Patient Name: Maria Cloud P. Date of Service: 11/23/2021 Medical Record Number: [id number] Patient Account Number: [id number] Date of Birth/Sex: 1965-06-29 (56 y.o. F) Treating RN: Carlene Coria Primary Care Provider: Alma Friendly Other Clinician: Referring Provider: Alma Friendly Treating Provider/Extender: Yaakov Guthrie in Treatment: 3 Diagnosis Coding ICD-10 Codes Code Description T25.222A Burn of second degree of left foot, initial encounter E11.621 Type 2 diabetes mellitus with foot ulcer Facility Procedures CPT4  Code: [id number] Description: [phone] - WOUND CARE VISIT-LEV 2 EST PT Modifier: Quantity: 1 Physician Procedures CPT4 Code: [id number] Description: 78412 - WC PHYS LEVEL 3 - EST PT Modifier: Quantity: 1 CPT4 Code: Description: ICD-10 Diagnosis Description T25.222A Burn of second degree of left foot, initial encounter E11.621 Type 2 diabetes mellitus with foot ulcer Modifier: Quantity: Electronic Signature(s) Signed: 11/23/2021 9:07:33 AM By: [name] DO Entered By: [name] on 11/23/2021 08:55:27

## 2021-11-30 ENCOUNTER — Encounter (HOSPITAL_BASED_OUTPATIENT_CLINIC_OR_DEPARTMENT_OTHER): Payer: Medicare PPO | Admitting: Internal Medicine

## 2021-11-30 DIAGNOSIS — I10 Essential (primary) hypertension: Secondary | ICD-10-CM | POA: Diagnosis not present

## 2021-11-30 DIAGNOSIS — E11621 Type 2 diabetes mellitus with foot ulcer: Secondary | ICD-10-CM | POA: Diagnosis not present

## 2021-11-30 DIAGNOSIS — L559 Sunburn, unspecified: Secondary | ICD-10-CM | POA: Diagnosis not present

## 2021-11-30 DIAGNOSIS — L97529 Non-pressure chronic ulcer of other part of left foot with unspecified severity: Secondary | ICD-10-CM | POA: Diagnosis not present

## 2021-11-30 DIAGNOSIS — T25222A Burn of second degree of left foot, initial encounter: Secondary | ICD-10-CM

## 2021-11-30 DIAGNOSIS — Z6841 Body Mass Index (BMI) 40.0 and over, adult: Secondary | ICD-10-CM | POA: Diagnosis not present

## 2021-11-30 NOTE — Progress Notes (Addendum)
Maria, [name] ([id number]) Visit Report for 11/30/2021 Arrival Information Details Patient Name: [name], [name]. Date of Service: 11/30/2021 8:00 AM Medical Record Number: [id number] Patient Account Number: [id number] Date of Birth/Sex: 12/03/1965 (56 y.o. F) Treating RN: [name] Primary Care [name]: [name] Other Clinician: Referring [name]: [name] Treating [name]/Extender: [name] in Treatment: 4 Visit Information History Since Last Visit All ordered tests and consults were completed: No Patient Arrived: Wheel Chair Added or deleted any medications: No Arrival Time: 08:02 Any new allergies or adverse reactions: No Accompanied By: husband Had a fall or experienced change in No Transfer Assistance: None activities of daily living that may affect Patient Identification Verified: Yes risk of falls: Secondary Verification Process Completed: Yes Signs or symptoms of abuse/neglect since last visito No Patient Requires Transmission-Based Precautions: No Hospitalized since last visit: No Patient Has Alerts: No Implantable device outside of the clinic excluding No cellular tissue based products placed in the center since last visit: Has Dressing in Place as Prescribed: Yes Pain Present Now: No Electronic Signature(s) Signed: 11/30/2021 8:36:51 AM By: Maria Coria RN Entered By: [name] [name] 11/30/2021 08:07:04 [name], [name] ([id number]) -------------------------------------------------------------------------------- Clinic Level of Care Assessment Details Patient Name: Maria Cloud P. Date of Service: 11/30/2021 8:00 AM Medical Record Number: [id number] Patient Account Number: [id number] Date of Birth/Sex: 1966-03-08 (56 y.o. F) Treating RN: [name] Primary Care [name]: [name] Other Clinician: Referring [name]: [name] Treating [name]/Extender: [name] in Treatment: 4 Clinic Level of  Care Assessment Items TOOL 4 Quantity Score X - Use when only an EandM is performed [name] FOLLOW-UP visit 1 0 ASSESSMENTS - Nursing Assessment / Reassessment X - Reassessment of Co-morbidities (includes updates in patient status) 1 10 X- 1 5 Reassessment of Adherence to Treatment Plan ASSESSMENTS - Maria and Skin Assessment / Reassessment X - Simple Maria Assessment / Reassessment - one Maria 1 5 '[]'$  - 0 Complex Maria Assessment / Reassessment - multiple wounds '[]'$  - 0 Dermatologic / Skin Assessment (not related to Maria area) ASSESSMENTS - Focused Assessment '[]'$  - Circumferential Edema Measurements - multi extremities 0 '[]'$  - 0 Nutritional Assessment / Counseling / Intervention '[]'$  - 0 Lower Extremity Assessment (monofilament, tuning fork, pulses) '[]'$  - 0 Peripheral Arterial Disease Assessment (using hand held doppler) ASSESSMENTS - Ostomy and/or Continence Assessment and Care '[]'$  - Incontinence Assessment and Management 0 '[]'$  - 0 Ostomy Care Assessment and Management (repouching, etc.) PROCESS - Coordination of Care X - Simple Patient / Family Education for ongoing care 1 15 '[]'$  - 0 Complex (extensive) Patient / Family Education for ongoing care '[]'$  - 0 Staff obtains Programmer, systems, Records, Test Results / Process Orders '[]'$  - 0 Staff telephones HHA, Nursing Homes / Clarify orders / etc '[]'$  - 0 Routine Transfer to another Facility (non-emergent condition) '[]'$  - 0 Routine Hospital Admission (non-emergent condition) '[]'$  - 0 New Admissions / Biomedical engineer / Ordering NPWT, Apligraf, etc. '[]'$  - 0 Emergency Hospital Admission (emergent condition) X- 1 10 Simple Discharge Coordination '[]'$  - 0 Complex (extensive) Discharge Coordination PROCESS - Special Needs '[]'$  - Pediatric / Minor Patient Management 0 '[]'$  - 0 Isolation Patient Management '[]'$  - 0 Hearing / Language / Visual special needs '[]'$  - 0 Assessment of Community assistance (transportation, D/C planning, etc.) '[]'$  - 0 Additional  assistance / Altered mentation '[]'$  - 0 Support Surface(s) Assessment (bed, cushion, seat, etc.) INTERVENTIONS - Maria Cleansing / Measurement [name], [name] ([id number]) X- 1 5 Simple Maria Cleansing -  one Maria '[]'$  - 0 Complex Maria Cleansing - multiple wounds X- 1 5 Maria Imaging (photographs - any number of wounds) '[]'$  - 0 Maria Tracing (instead of photographs) X- 1 5 Simple Maria Measurement - one Maria '[]'$  - 0 Complex Maria Measurement - multiple wounds INTERVENTIONS - Maria Dressings '[]'$  - Small Maria Dressing one or multiple wounds 0 '[]'$  - 0 Medium Maria Dressing one or multiple wounds '[]'$  - 0 Large Maria Dressing one or multiple wounds '[]'$  - 0 Application of Medications - topical '[]'$  - 0 Application of Medications - injection INTERVENTIONS - Miscellaneous '[]'$  - External ear exam 0 '[]'$  - 0 Specimen Collection (cultures, biopsies, blood, body fluids, etc.) '[]'$  - 0 Specimen(s) / Culture(s) sent or taken to Lab for analysis '[]'$  - 0 Patient Transfer (multiple staff / Civil Service fast streamer / Similar devices) '[]'$  - 0 Simple Staple / Suture removal (25 or less) '[]'$  - 0 Complex Staple / Suture removal (26 or more) '[]'$  - 0 Hypo / Hyperglycemic Management (close monitor of Blood Glucose) '[]'$  - 0 Ankle / Brachial Index (ABI) - do not check if billed separately X- 1 5 Vital Signs Has the patient been seen at the hospital within the last three years: Yes Total Score: 65 Level Of Care: New/Established - Level 2 Electronic Signature(s) Signed: 12/02/2021 12:07:54 PM By: Maria Coria RN Entered By: [name] [name] 11/30/2021 08:50:48 [name], [name] ([id number]) -------------------------------------------------------------------------------- Encounter Discharge Information Details Patient Name: Maria Cloud P. Date of Service: 11/30/2021 8:00 AM Medical Record Number: [id number] Patient Account Number: [id number] Date of Birth/Sex: March 04, 1966 (56 y.o. F) Treating RN: [name] Primary Care  [name]: [name] Other Clinician: Referring Sebastian Lurz: [name] Treating Briannah Lona/Extender: [name] in Treatment: 4 Encounter Discharge Information Items Discharge Condition: Stable Ambulatory Status: Wheelchair Discharge Destination: Home Transportation: Private Auto Accompanied By: husband Schedule Follow-up Appointment: Yes Clinical Summary of Care: Electronic Signature(s) Signed: 12/02/2021 12:07:54 PM By: Maria Coria RN Entered By: [name] [name] 11/30/2021 08:51:39 [name], [name] ([id number]) -------------------------------------------------------------------------------- Lower Extremity Assessment Details Patient Name: Maria Cloud P. Date of Service: 11/30/2021 8:00 AM Medical Record Number: [id number] Patient Account Number: [id number] Date of Birth/Sex: 22-Aug-1965 (56 y.o. F) Treating RN: [name] Primary Care Sumiya Mamaril: [name] Other Clinician: Referring Phillp Dolores: [name] Treating Luz Burcher/Extender: [name] in Treatment: 4 Electronic Signature(s) Signed: 11/30/2021 8:36:51 AM By: Maria Coria RN Entered By: [name] [name] 11/30/2021 08:21:18 [name], [name] ([id number]) -------------------------------------------------------------------------------- Multi Maria Chart Details Patient Name: Maria Cloud P. Date of Service: 11/30/2021 8:00 AM Medical Record Number: [id number] Patient Account Number: [id number] Date of Birth/Sex: 11/06/1965 (56 y.o. F) Treating RN: [name] Primary Care Cantrell Martus: [name] Other Clinician: Referring Talin Feister: [name] Treating Deitrich Steve/Extender: [name] in Treatment: 4 Vital Signs Height(in): 67 Pulse(bpm): 101 Weight(lbs): 311 Blood Pressure(mmHg): 177/84 Body Mass Index(BMI): 48.7 Temperature(F): 97.7 Respiratory Rate(breaths/min): 18 Photos: [N/A:N/A] Maria Location: Left, Anterior Foot N/A N/A Wounding Event: Thermal  Burn N/A N/A Primary Etiology: 2nd degree Burn N/A N/A Comorbid History: Lymphedema, Asthma, Sleep N/A N/A Apnea, Hypertension, Colitis, Type II Diabetes, Osteoarthritis, Neuropathy Date Acquired: 10/29/2021 N/A N/A Weeks of Treatment: 4 N/A N/A Maria Status: Healed - Epithelialized N/A N/A Maria Recurrence: No N/A N/A Clustered Maria: Yes N/A N/A Measurements L x W x D (cm) 0x0x0 N/A N/A Area (cm) : 0 N/A N/A Volume (cm) : 0 N/A N/A % Reduction in Area: 100.00% N/A N/A % Reduction in Volume: 100.00% N/A N/A Classification: Full Thickness  Without Exposed N/A N/A Support Structures Exudate Amount: None Present N/A N/A Granulation Amount: Large (67-100%) N/A N/A Necrotic Amount: None Present (0%) N/A N/A Exposed Structures: Fascia: No N/A N/A Fat Layer (Subcutaneous Tissue): No Tendon: No Muscle: No Joint: No Bone: No Epithelialization: Large (67-100%) N/A N/A Treatment Notes Electronic Signature(s) Signed: 11/30/2021 8:36:51 AM By: Maria Coria RN Entered By: [name] [name] 11/30/2021 08:21:29 [name], [name] ([id number]) -------------------------------------------------------------------------------- Harmony Details Patient Name: Maria Cloud P. Date of Service: 11/30/2021 8:00 AM Medical Record Number: [id number] Patient Account Number: [id number] Date of Birth/Sex: May 01, 1965 (56 y.o. F) Treating RN: [name] Primary Care Lovel Suazo: [name] Other Clinician: Referring Shelbylynn Walczyk: [name] Treating Gurdeep Keesey/Extender: [name] in Treatment: 4 Active Inactive Electronic Signature(s) Signed: 12/02/2021 12:07:54 PM By: Maria Coria RN Previous Signature: 11/30/2021 8:36:51 AM Version By: Maria Coria RN Entered By: [name] [name] 11/30/2021 08:52:24 [name], [name] ([id number]) -------------------------------------------------------------------------------- Pain Assessment Details Patient Name: Maria Cloud  P. Date of Service: 11/30/2021 8:00 AM Medical Record Number: [id number] Patient Account Number: [id number] Date of Birth/Sex: 1965/11/28 (56 y.o. F) Treating RN: [name] Primary Care Angeliki Mates: [name] Other Clinician: Referring Maimouna Rondeau: [name] Treating Irvine Glorioso/Extender: [name] in Treatment: 4 Active Problems Location of Pain Severity and Description of Pain Patient Has Paino No Site Locations Pain Management and Medication Current Pain Management: Electronic Signature(s) Signed: 11/30/2021 8:36:51 AM By: Maria Coria RN Entered By: [name] [name] 11/30/2021 08:07:52 [name], [name] ([id number]) -------------------------------------------------------------------------------- Patient/Caregiver Education Details Patient Name: Maria Cloud P. Date of Service: 11/30/2021 8:00 AM Medical Record Number: [id number] Patient Account Number: [id number] Date of Birth/Gender: [birth year] (56 y.o. F) Treating RN: [name] Primary Care Physician: [name] Other Clinician: Referring Physician: Alma [name] Treating Physician/Extender: [name] in Treatment: 4 Education Assessment Education Provided To: Patient Education Topics Provided Maria/Skin Impairment: Methods: Explain/Verbal Responses: State content correctly Electronic Signature(s) Signed: 12/02/2021 12:07:54 PM By: Maria Coria RN Entered By: [name] [name] 11/30/2021 08:51:06 [name], [name] ([id number]) -------------------------------------------------------------------------------- Maria Assessment Details Patient Name: Maria Cloud P. Date of Service: 11/30/2021 8:00 AM Medical Record Number: [id number] Patient Account Number: [id number] Date of Birth/Sex: 26-Sep-1965 (56 y.o. F) Treating RN: [name] Primary Care Bryndan Bilyk: [name] Other Clinician: Referring Alison Kubicki: [name] Treating Letricia Krinsky/Extender: [name] in  Treatment: 4 Maria Status Maria Number: 2 Primary 2nd degree Burn Etiology: Maria Location: Left, Anterior Foot Maria Healed - Epithelialized Wounding Event: Thermal Burn Status: Date Acquired: 10/29/2021 Comorbid Lymphedema, Asthma, Sleep Apnea, Hypertension, Weeks Of Treatment: 4 History: Colitis, Type II Diabetes, Osteoarthritis, Neuropathy Clustered Maria: Yes Photos Maria Measurements Length: (cm) 0 Width: (cm) 0 Depth: (cm) 0 Area: (cm) 0 Volume: (cm) 0 % Reduction in Area: 100% % Reduction in Volume: 100% Epithelialization: Large (67-100%) Tunneling: No Undermining: No Maria Description Classification: Full Thickness Without Exposed Support Structures Exudate Amount: None Present Foul Odor After Cleansing: No Slough/Fibrino No Maria Bed Granulation Amount: Large (67-100%) Exposed Structure Necrotic Amount: None Present (0%) Fascia Exposed: No Fat Layer (Subcutaneous Tissue) Exposed: No Tendon Exposed: No Muscle Exposed: No Joint Exposed: No Bone Exposed: No Treatment Notes Maria #2 (Foot) Maria Laterality: Left, Anterior Cleanser Peri-Maria Care Topical Primary Dressing ONNIE, ALATORRE ([id number]) Secondary Dressing Secured With Compression Wrap Compression Stockings Add-Ons Electronic Signature(s) Signed: 11/30/2021 8:36:51 AM By: Maria Coria RN Entered By: [name] [name] 11/30/2021 08:21:08 Pearce, [name] ([id number]) -------------------------------------------------------------------------------- Vitals Details Patient Name: Maria Cloud P. Date of Service: 11/30/2021 8:00  AM Medical Record Number: [id number] Patient Account Number: [id number] Date of Birth/Sex: Aug 14, 1965 (56 y.o. F) Treating RN: [name] Primary Care Latarra Eagleton: [name] Other Clinician: Referring Avelynn Sellin: [name] Treating Oaklee Esther/Extender: [name] in Treatment: 4 Vital Signs Time Taken: 08:07 Temperature (F): 97.7 Height (in):  67 Pulse (bpm): 101 Weight (lbs): 311 Respiratory Rate (breaths/min): 18 Body Mass Index (BMI): 48.7 Blood Pressure (mmHg): 177/84 Reference Range: 80 - 120 mg / dl Electronic Signature(s) Signed: 11/30/2021 8:36:51 AM By: Maria Coria RN Entered By: [name] [name] 11/30/2021 08:07:46

## 2021-11-30 NOTE — Progress Notes (Signed)
INZA, MIKRUT ([id number]) Visit Report for 11/30/2021 Chief Complaint Document Details Patient Name: [name], [name]. Date of Service: 11/30/2021 8:00 AM Medical Record Number: [id number] Patient Account Number: [id number] Date of Birth/Sex: 04/04/1965 (56 y.o. F) Treating RN: Carlene Coria Primary Care Provider: Alma Friendly Other Clinician: Referring Provider: Alma Friendly Treating Provider/Extender: Yaakov Guthrie in Treatment: 4 Information Obtained from: Patient Chief Complaint 06/06/2021; Left 2nd toe ulcer 11/02/2021; Sunburn to the left foot Electronic Signature(s) Signed: 11/30/2021 8:25:53 AM By: [name] DO Entered By: [name] on 11/30/2021 08:21:54 Childers, Leeasia P. ([id number]) -------------------------------------------------------------------------------- HPI Details Patient Name: Maria Cloud P. Date of Service: 11/30/2021 8:00 AM Medical Record Number: [id number] Patient Account Number: [id number] Date of Birth/Sex: 1965-04-03 (56 y.o. F) Treating RN: Carlene Coria Primary Care Provider: Alma Friendly Other Clinician: Referring Provider: Alma Friendly Treating Provider/Extender: Yaakov Guthrie in Treatment: 4 History of Present Illness HPI Description: 06/06/2021 upon evaluation patient appears to be doing pretty well all things considered with regard to the wound on her left second toe. This is on the proximal interphalangeal joint. Subsequently she tells me that this is something that she noted for somewhere around mid January as best she can remember. Currently she has been using mupirocin ointment and at times soaking this with Epsom salt but overall has not had any true treatment of the area. She does note having a hemoglobin A1c of 5.25 February 2021 although this is in the [facility] I could not find that directly but that was what she reported back to [name]. Overall I am very pleased with where things stand though I  think she could potentially benefit from Santyl to try to clean up the last little bit of necrotic tissue of the central portion of the wound to allow this to heal more effectively. Patient does have a history of hypertension, diabetes mellitus type 2, and again this appears to have been a pressure injury initially although right now she is just using what she called yoga socks with a grip on the bottom which I think is perfect. 06/14/2021 upon evaluation today patient appears to be doing well with regard to her toe and actually very pleased with where things stand and I think she is making excellent progress here. Admission 11/02/2021 Ms. Rashea Hoskie is a 56 year old female with a past medical history of controlled type 2 diabetes on oral agents and insulin and morbid obesity that presents to the clinic for a 1-2-week history of some burn to the left foot. She has developed blistering and subsequently open wounds. [place] however her feet were likely exposed to the sun. She has been using Silvadene to the area. She currently denies signs of infection. 8/23; patient presents for follow. She has been using Santyl directly to the area of nonviable tissue and Xeroform to the area of skin breakdown. She has no issues or complaints today. She reports improvement in wound healing. She denies signs of infection. 8/30; patient presents for follow-up. Patient has been using Santyl to the wound beds. She has no issues or complaints today. She reports improvement in wound healing. 9/6; patient presents for follow-up. She has been using Xeroform to the wound bed and AandD ointment to the surrounding periwound. She has no issues or complaints today. She reports improvement in wound healing. 9/13; patient presents for follow-up. She has been using Xeroform and AandD to the wound bed and periwound. She reports improvement  in wound healing. She has no issues or  complaints today. Electronic Signature(s) Signed: 11/30/2021 8:25:53 AM By: [name] DO Entered By: [name] on 11/30/2021 08:23:26 [name] ([id number]) -------------------------------------------------------------------------------- Physical Exam Details Patient Name: Maria Cloud P. Date of Service: 11/30/2021 8:00 AM Medical Record Number: [id number] Patient Account Number: [id number] Date of Birth/Sex: [birth year] (56 y.o. F) Treating RN: Carlene Coria Primary Care Provider: Alma Friendly Other Clinician: Referring Provider: Alma Friendly Treating Provider/Extender: Yaakov Guthrie in Treatment: 4 Constitutional . Cardiovascular . Psychiatric . Notes Left foot: To the dorsal aspect there is epithelization to the previous wound site. No increased warmth, erythema or drainage noted. No fluctuance on palpation. Electronic Signature(s) Signed: 11/30/2021 8:25:53 AM By: [name] DO Entered By: [name] on 11/30/2021 08:24:08 [name] ([id number]) -------------------------------------------------------------------------------- Physician Orders Details Patient Name: Maria Cloud P. Date of Service: 11/30/2021 8:00 AM Medical Record Number: [id number] Patient Account Number: [id number] Date of Birth/Sex: Apr 30, 1965 (56 y.o. F) Treating RN: Carlene Coria Primary Care Provider: Alma Friendly Other Clinician: Referring Provider: Alma Friendly Treating Provider/Extender: Yaakov Guthrie in Treatment: 4 Verbal / Phone Orders: No Diagnosis Coding Discharge From [facility] o Discharge from [place] Treatment Complete - apply A and D ointment lightly daily Electronic Signature(s) Signed: 11/30/2021 8:25:53 AM By: [name] DO Entered By: [name] on 11/30/2021 08:25:11 Lopiccolo, Jailah P. ([id number]) -------------------------------------------------------------------------------- Problem  List Details Patient Name: Maria Cloud P. Date of Service: 11/30/2021 8:00 AM Medical Record Number: [id number] Patient Account Number: [id number] Date of Birth/Sex: [birth year] (56 y.o. F) Treating RN: Carlene Coria Primary Care Provider: Alma Friendly Other Clinician: Referring Provider: Alma Friendly Treating Provider/Extender: Yaakov Guthrie in Treatment: 4 Active Problems ICD-10 Encounter Code Description Active Date MDM Diagnosis T25.222A Burn of second degree of left foot, initial encounter 11/02/2021 No Yes E11.621 Type 2 diabetes mellitus with foot ulcer 11/02/2021 No Yes Inactive Problems Resolved Problems Electronic Signature(s) Signed: 11/30/2021 8:25:53 AM By: [name] DO Entered By: [name] on 11/30/2021 08:21:49 Gladney, Brandilynn P. ([id number]) -------------------------------------------------------------------------------- Progress Note Details Patient Name: Maria Cloud P. Date of Service: 11/30/2021 8:00 AM Medical Record Number: [id number] Patient Account Number: [id number] Date of Birth/Sex: [birth year] (56 y.o. F) Treating RN: Carlene Coria Primary Care Provider: Alma Friendly Other Clinician: Referring Provider: Alma Friendly Treating Provider/Extender: Yaakov Guthrie in Treatment: 4 Subjective Chief Complaint Information obtained from Patient 06/06/2021; Left 2nd toe ulcer 11/02/2021; Sunburn to the left foot History of Present Illness (HPI) 06/06/2021 upon evaluation patient appears to be doing pretty well all things considered with regard to the wound on her left second toe. This is on the proximal interphalangeal joint. Subsequently she tells me that this is something that she noted for somewhere around mid January as best she can remember. Currently she has been using mupirocin ointment and at times soaking this with Epsom salt but overall has not had any true treatment of the area. She does note having a hemoglobin A1c  of 5.25 February 2021 although this is [facility] I could not find that directly but that was what she reported back to [name]. Overall I am very pleased with where things stand though I think she could potentially benefit from Santyl to try to clean up the last little bit of necrotic tissue of the central portion of the wound to allow this to heal more effectively. Patient does have a history of hypertension, diabetes mellitus type 2, and again this appears to have  been a pressure injury initially although right now she is just using what she called yoga socks with a grip on the bottom which I think is perfect. 06/14/2021 upon evaluation today patient appears to be doing well with regard to her toe and actually very pleased with where things stand and I think she is making excellent progress here. Admission 11/02/2021 Ms. Kerigan Narvaez is a 56 year old female with a past medical history of controlled type 2 diabetes on oral agents and insulin and morbid obesity that presents to the clinic for a 1-2-week history of some burn to the left foot. She has developed blistering and subsequently open wounds. [place] however her feet were likely exposed to the sun. She has been using Silvadene to the area. She currently denies signs of infection. 8/23; patient presents for follow. She has been using Santyl directly to the area of nonviable tissue and Xeroform to the area of skin breakdown. She has no issues or complaints today. She reports improvement in wound healing. She denies signs of infection. 8/30; patient presents for follow-up. Patient has been using Santyl to the wound beds. She has no issues or complaints today. She reports improvement in wound healing. 9/6; patient presents for follow-up. She has been using Xeroform to the wound bed and AandD ointment to the surrounding periwound. She has no issues or complaints today. She reports improvement  in wound healing. 9/13; patient presents for follow-up. She has been using Xeroform and AandD to the wound bed and periwound. She reports improvement in wound healing. She has no issues or complaints today. Objective Constitutional Vitals Time Taken: 8:07 AM, Height: 67 in, Weight: 311 lbs, BMI: 48.7, Temperature: 97.7 F, Pulse: 101 bpm, Respiratory Rate: 18 breaths/min, Blood Pressure: 177/84 mmHg. General Notes: Left foot: To the dorsal aspect there is epithelization to the previous wound site. No increased warmth, erythema or drainage noted. No fluctuance on palpation. Integumentary (Hair, Skin) Wound #2 status is Healed - Epithelialized. Original cause of wound was Thermal Burn. The date acquired was: 10/29/2021. The wound has been in treatment 4 weeks. The wound is located on the [place]. The wound measures 0cm length x 0cm width x 0cm depth; 0cm^2 area and 0cm^3 volume. There is no tunneling or undermining noted. There is a none present amount of drainage noted. There is large (67-100%) granulation within the wound bed. There is no necrotic tissue within the wound bed. LANINA, LARRANAGA ([id number]) Assessment Active Problems ICD-10 Burn of second degree of left foot, initial encounter Type 2 diabetes mellitus with foot ulcer Patient has done well with Xeroform and AandD ointment. Her wound is healed. She may follow-up as needed. Plan Discharge From [facility] Services: Discharge from [place] Treatment Complete - apply A and D ointment lightly daily 1. Discharge from clinic due to closed wound 2. Follow-up as needed Electronic Signature(s) Signed: 11/30/2021 8:25:53 AM By: [name] DO Entered By: [name] on 11/30/2021 08:24:56 Sellers, Cayce P. ([id number]) -------------------------------------------------------------------------------- SuperBill Details Patient Name: Maria Cloud P. Date of Service: 11/30/2021 Medical Record Number:  [id number] Patient Account Number: [id number] Date of Birth/Sex: 26-Oct-1965 (56 y.o. F) Treating RN: Carlene Coria Primary Care Provider: Alma Friendly Other Clinician: Referring Provider: Alma Friendly Treating Provider/Extender: Yaakov Guthrie in Treatment: 4 Diagnosis Coding ICD-10 Codes Code Description T25.222A Burn of second degree of left foot, initial encounter E11.621 Type 2 diabetes mellitus with foot ulcer Physician Procedures CPT4 Code:  4496759 Description: 99213 - WC PHYS LEVEL 3 - EST PT Modifier: Quantity: 1 CPT4 Code: Description: ICD-10 Diagnosis Description T25.222A Burn of second degree of left foot, initial encounter E11.621 Type 2 diabetes mellitus with foot ulcer Modifier: Quantity: Electronic Signature(s) Signed: 11/30/2021 8:25:53 AM By: [name] DO Entered By: [name] on 11/30/2021 08:25:05

## 2021-12-07 ENCOUNTER — Encounter: Payer: Medicare PPO | Admitting: Internal Medicine

## 2021-12-19 ENCOUNTER — Other Ambulatory Visit: Payer: Self-pay | Admitting: Primary Care

## 2021-12-19 DIAGNOSIS — I10 Essential (primary) hypertension: Secondary | ICD-10-CM

## 2021-12-19 DIAGNOSIS — E1165 Type 2 diabetes mellitus with hyperglycemia: Secondary | ICD-10-CM | POA: Diagnosis not present

## 2021-12-19 DIAGNOSIS — R000 Tachycardia, unspecified: Secondary | ICD-10-CM

## 2022-01-12 ENCOUNTER — Encounter: Payer: Self-pay | Admitting: Family Medicine

## 2022-01-12 ENCOUNTER — Ambulatory Visit: Payer: Medicare PPO | Admitting: Family Medicine

## 2022-01-12 DIAGNOSIS — J4520 Mild intermittent asthma, uncomplicated: Secondary | ICD-10-CM | POA: Diagnosis not present

## 2022-01-12 DIAGNOSIS — R059 Cough, unspecified: Secondary | ICD-10-CM

## 2022-01-12 MED ORDER — HYDROCOD POLI-CHLORPHE POLI ER 10-8 MG/5ML PO SUER
5.0000 mL | Freq: Two times a day (BID) | ORAL | 0 refills | Status: DC | PRN
Start: 2022-01-12 — End: 2022-01-19

## 2022-01-12 MED ORDER — NEBULIZER MISC: 0 refills | Status: AC

## 2022-01-12 MED ORDER — DOXYCYCLINE HYCLATE 100 MG PO TABS: 100.0000 mg | ORAL_TABLET | Freq: Two times a day (BID) | ORAL | 0 refills | Status: DC

## 2022-01-12 MED ORDER — ALBUTEROL SULFATE (2.5 MG/3ML) 0.083% IN NEBU
2.5000 mg | INHALATION_SOLUTION | Freq: Four times a day (QID) | RESPIRATORY_TRACT | 2 refills | Status: DC | PRN
Start: 2022-01-12 — End: 2023-02-16

## 2022-01-12 MED ORDER — INSULIN DEGLUDEC 100 UNIT/ML ~~LOC~~ SOPN: 40.0000 [IU] | PEN_INJECTOR | Freq: Two times a day (BID) | SUBCUTANEOUS | Status: DC

## 2022-01-12 MED ORDER — ALBUTEROL SULFATE HFA 108 (90 BASE) MCG/ACT IN AERS: 1.0000 | INHALATION_SPRAY | Freq: Four times a day (QID) | RESPIRATORY_TRACT | 1 refills | Status: AC | PRN

## 2022-01-12 NOTE — Progress Notes (Signed)
Sick for a little more than a week.  Other family members were ill.  No known covid exposure.  No vomiting.  Cough, sputum is green.  No fevers known.  Prev with facial pain, maxillary and frontal, less pain now.  B ear pain.  Throat irritation from cough.  Voice is scratchy.  Some wheeze.  Sugar has been 200s.  Albuterol helps some, temporarily.    Her aunt just died and the funeral is day after tomorrow, local.    She can tolerate hydrocodone.  D/w pt.    Meds, vitals, and allergies reviewed.   ROS: Per HPI unless specifically indicated in ROS section   Nad Ncat B max sinuses ttp TM wnl B Neck supple, no LA rrr B exp wheeze w/o focal dec in BS.  Abdomen soft. Skin well perfused. In wheelchair at baseline.

## 2022-01-12 NOTE — Patient Instructions (Signed)
Cough syrup as needed, sedation caution.  Use albuterol if needed.   Start doxy.  Rest and fluids.  Take care.  Glad to see you.

## 2022-01-15 NOTE — Assessment & Plan Note (Addendum)
Could be from either sinusitis or bronchitis.  Discussed options.  Okay for outpatient follow-up. Cough syrup as needed, sedation caution.  Use albuterol if needed.   Start doxycycline. Rest and fluids.  Update [place] as needed. Condolences offered regarding her aunt.

## 2022-01-19 DIAGNOSIS — R059 Cough, unspecified: Secondary | ICD-10-CM

## 2022-01-19 MED ORDER — HYDROCOD POLI-CHLORPHE POLI ER 10-8 MG/5ML PO SUER: 5.0000 mL | Freq: Two times a day (BID) | ORAL | 0 refills | Status: DC | PRN

## 2022-01-25 ENCOUNTER — Other Ambulatory Visit: Payer: Self-pay | Admitting: Primary Care

## 2022-01-25 DIAGNOSIS — M62838 Other muscle spasm: Secondary | ICD-10-CM

## 2022-02-01 DIAGNOSIS — Z7985 Long-term (current) use of injectable non-insulin antidiabetic drugs: Secondary | ICD-10-CM | POA: Diagnosis not present

## 2022-02-01 DIAGNOSIS — E1165 Type 2 diabetes mellitus with hyperglycemia: Secondary | ICD-10-CM | POA: Diagnosis not present

## 2022-02-01 DIAGNOSIS — Z8349 Family history of other endocrine, nutritional and metabolic diseases: Secondary | ICD-10-CM | POA: Diagnosis not present

## 2022-02-01 DIAGNOSIS — Z794 Long term (current) use of insulin: Secondary | ICD-10-CM | POA: Diagnosis not present

## 2022-02-01 DIAGNOSIS — Z7984 Long term (current) use of oral hypoglycemic drugs: Secondary | ICD-10-CM | POA: Diagnosis not present

## 2022-02-01 DIAGNOSIS — Z23 Encounter for immunization: Secondary | ICD-10-CM | POA: Diagnosis not present

## 2022-03-22 DIAGNOSIS — E1165 Type 2 diabetes mellitus with hyperglycemia: Secondary | ICD-10-CM | POA: Diagnosis not present

## 2022-04-09 ENCOUNTER — Other Ambulatory Visit: Payer: Self-pay | Admitting: Primary Care

## 2022-04-09 DIAGNOSIS — I6523 Occlusion and stenosis of bilateral carotid arteries: Secondary | ICD-10-CM

## 2022-04-09 DIAGNOSIS — E782 Mixed hyperlipidemia: Secondary | ICD-10-CM

## 2022-04-09 DIAGNOSIS — I10 Essential (primary) hypertension: Secondary | ICD-10-CM

## 2022-04-09 DIAGNOSIS — E1140 Type 2 diabetes mellitus with diabetic neuropathy, unspecified: Secondary | ICD-10-CM

## 2022-04-13 ENCOUNTER — Telehealth: Payer: Self-pay

## 2022-04-13 NOTE — Progress Notes (Signed)
Care Management & Coordination Services Pharmacy Team  Reason for Encounter: Appointment Reminder  Contacted patient to confirm in office appointment with Charlene Brooke, PharmD, on 04/18/2022 at 11:00.  Unsuccessful outreach. Left voicemail for patient to return call.  Chart review:  Recent office visits:  01/19/22 Patient Message concerning cough Start: chlorpheniramine-HYDROcodone ([place]) 10-8 MG/5ML   Recent consult visits:  02/01/22 [name], FNP (Endo) DM No med changes F/U 3 months 01/12/22 [name], MD Asthma Start: doxycycline (VIBRA-TABS) 100 MG tablet Start: chlorpheniramine-HYDROcodone (TUSSIONEX) 10-8 MG/5ML Change: insulin degludec (TRESIBA) 100 UNIT/ML FlexTouch Pen Change: silver sulfADIAZINE (SILVADENE) 1 % cream 1 Application Stop (Patient): budesonide (ENTOCORT EC) 3 MG 24 hr capsule Stop (Patient): colestipol (COLESTID) 1 g tablet Stop (Patient): SANTYL 250 UNIT/GM ointment Stop (Patient): ketoconazole (NIZORAL) 2 % cream  11/30/21 [name], DO (Wound Healing) Wound care on left second toe.  11/23/21 [name], DO (Wound Healing) Wound care on left second toe.  11/16/21 [name], DO (Wound Healing) Wound care on left second toe.  11/09/21 [name], DO (Wound Healing) Wound care on left second toe.  11/02/21 [name], DO (Wound Healing) Wound care on left second toe.  10/24/21 [name], NP Urinary Frequency Start: silver sulfADIAZINE (SILVADENE) 1 % cream 1 Application Stop (completed): Benzonatate 200 mg Stop (completed): Dextromethorphan guaifenseign 100-10 mg Stop (completed): chlorpheniramine-HYDROcodone (TUSSIONEX PENNKINETIC ER) 10-8 MG/5ML 10/19/21 [name] 09/18/21 Fenton Malling, PA Suspected UTI Start: nitrofurantoin, macrocrystal-monohydrate, (MACROBID) 100 MG capsule   Hospital visits:  None in previous 6  months  Medications: Outpatient Encounter Medications as of 04/13/2022  Medication Sig   acetaminophen (TYLENOL) 650 MG CR tablet Take 650 mg by mouth at bedtime.   albuterol (PROVENTIL) (2.5 MG/3ML) 0.083% nebulizer solution Take 3 mLs (2.5 mg total) by nebulization every 6 (six) hours as needed for wheezing or shortness of breath.   albuterol (VENTOLIN HFA) 108 (90 Base) MCG/ACT inhaler Inhale 1-2 puffs into the lungs every 6 (six) hours as needed for wheezing or shortness of breath.   atorvastatin (LIPITOR) 40 MG tablet Take 1 tablet (40 mg total) by mouth daily. for cholesterol.   chlorpheniramine-HYDROcodone (TUSSIONEX) 10-8 MG/5ML Take 5 mLs by mouth every 12 (twelve) hours as needed for cough.   Continuous Blood Gluc Receiver ([place]) DEVI by Does not apply route.   Continuous Blood Gluc Sensor (DEXCOM G7 SENSOR) MISC by Does not apply route.   cyclobenzaprine (FLEXERIL) 10 MG tablet TAKE 1 TABLET BY MOUTH 3 TIMES DAILY AS NEEDED FOR MUSCLE SPASMS   doxycycline (VIBRA-TABS) 100 MG tablet Take 1 tablet (100 mg total) by mouth 2 (two) times daily.   Dulaglutide (TRULICITY) 3 GY/1.8HU SOPN Inject into the skin.   empagliflozin (JARDIANCE) 25 MG TABS tablet Take 1 tablet (25 mg total) by mouth daily before breakfast. For diabetes.   ergocalciferol (VITAMIN D2) 1.25 MG (50000 UT) capsule Take by mouth.   fexofenadine (ALLEGRA) 180 MG tablet 1 tablet   FLUoxetine (PROZAC) 40 MG capsule TAKE 1 CAPSULE BY MOUTH ONCE DAILY FOR ANXIETY AND DEPRESSION   fluticasone (FLONASE) 50 MCG/ACT nasal spray Place 1 spray into both nostrils 2 (two) times daily.   gabapentin (NEURONTIN) 600 MG tablet Take 1 tablet (600 mg total) by mouth in the morning and at bedtime. For neuropathy   Glucagon 3 MG/DOSE POWD Place into the nose.   hydrochlorothiazide (HYDRODIURIL) 12.5 MG tablet Take 1 tablet (12.5 mg total) by mouth  daily. for blood pressure.   insulin aspart (NOVOLOG FLEXPEN) 100 UNIT/ML FlexPen  Inject 22-26 Units into the skin 3 (three) times daily with meals.   insulin degludec (TRESIBA) 100 UNIT/ML FlexTouch Pen Inject 40 Units into the skin 2 (two) times daily.   Insulin Pen Needle 32G X 4 MM MISC Use 1 needle with pen as directed   levocetirizine (XYZAL) 5 MG tablet TAKE ONE TABLET BY MOUTH EVERY EVENING   losartan (COZAAR) 50 MG tablet TAKE 1 TABLET BY MOUTH ONCE DAILY FOR BLOOD PRESSURE   metoprolol tartrate (LOPRESSOR) 25 MG tablet TAKE 0.5 TABLET BY MOUTH TWICE DAILY FORBLOOD PRESSURE   Multiple Vitamin (MULTIVITAMIN PO) Take 1 tablet by mouth daily.    Nebulizer MISC Dispense 1 nebulizer for home use, with tubing and supplies. J45.909.   ondansetron (ZOFRAN-ODT) 4 MG disintegrating tablet TAKE 1 TABLET BY MOUTH EVERY 8 HOURS AS NEEDED FOR NAUSEA AS DIRECTED.   VALERIAN ROOT PO Take 1,000 mg by mouth at bedtime.   Facility-Administered Encounter Medications as of 04/13/2022  Medication   silver sulfADIAZINE (SILVADENE) 1 % cream 1 Application   Lab Results  Component Value Date/Time   HGBA1C 7.1 05/18/2020 12:00 AM   HGBA1C 10.6 (A) 10/01/2019 08:39 AM   HGBA1C 10.1 (A) 01/30/2019 08:02 AM   HGBA1C 9.4 (H) 07/15/2018 08:31 AM   HGBA1C 10.3 (H) 01/31/2018 03:56 PM   MICROALBUR <0.7 06/14/2021 08:44 AM   MICROALBUR 1.5 01/10/2016 08:42 AM    BP Readings from Last 3 Encounters:  01/12/22 122/66  10/24/21 114/76  07/11/21 120/70    Star Rating Drugs:  Medication:  Last Fill: Day Supply Atorvastatin 40 mg  Trulicity 3 mg  72/53/66 30 Jardiance 25 mg 03/20/22 30 Losartan 50 mg 03/06/22 90  Care Gaps: Annual wellness visit in last year? Yes 09/30/21  If Diabetic: Last eye exam / retinopathy screening: Overdue Last diabetic foot exam: Overdue  Charlene Brooke, PharmD notified  [name], [place] Pharmacy Assistant [phone]

## 2022-04-16 ENCOUNTER — Other Ambulatory Visit: Payer: Self-pay | Admitting: Primary Care

## 2022-04-16 DIAGNOSIS — R110 Nausea: Secondary | ICD-10-CM

## 2022-04-16 MED ORDER — ONDANSETRON 4 MG PO TBDP: ORAL_TABLET | ORAL | 0 refills | Status: DC

## 2022-04-16 NOTE — Telephone Encounter (Signed)
From: [name] To: Office of Pleas Koch, NP Sent: 04/15/2022 1:14 PM EST Subject: Medication Renewal Request  Refills have been requested for the following medications:   ondansetron (ZOFRAN-ODT) 4 MG disintegrating tablet [[name]]  Preferred pharmacy: [facility], [place] Delivery method: [employer]

## 2022-04-18 ENCOUNTER — Encounter: Payer: Medicare PPO | Admitting: Pharmacist

## 2022-05-06 ENCOUNTER — Telehealth: Payer: Medicare PPO | Admitting: Family Medicine

## 2022-05-06 DIAGNOSIS — J320 Chronic maxillary sinusitis: Secondary | ICD-10-CM | POA: Diagnosis not present

## 2022-05-06 MED ORDER — DOXYCYCLINE HYCLATE 100 MG PO TABS
100.0000 mg | ORAL_TABLET | Freq: Two times a day (BID) | ORAL | 0 refills | Status: DC
Start: 2022-05-06 — End: 2022-05-14

## 2022-05-06 NOTE — Progress Notes (Signed)
E-Visit for Sinus Problems  We are sorry that you are not feeling well.  Here is how we plan to help!  Based on what you have shared with me it looks like you have sinusitis.  Sinusitis is inflammation and infection in the sinus cavities of the head.  Based on your presentation I believe you most likely have Acute Bacterial Sinusitis.  This is an infection caused by bacteria and is treated with antibiotics. I have prescribed Doxycycline 11m by mouth twice a day for 10 days. You may use an oral decongestant such as Mucinex D or if you have glaucoma or high blood pressure use plain Mucinex. Saline nasal spray help and can safely be used as often as needed for congestion.  If you develop worsening sinus pain, fever or notice severe headache and vision changes, or if symptoms are not better after completion of antibiotic, please schedule an appointment with a health care provider.    Sinus infections are not as easily transmitted as other respiratory infection, however we still recommend that you avoid close contact with loved ones, especially the very young and elderly.  Remember to wash your hands thoroughly throughout the day as this is the number one way to prevent the spread of infection!  Home Care: Only take medications as instructed by your medical team. Complete the entire course of an antibiotic. Do not take these medications with alcohol. A steam or ultrasonic humidifier can help congestion.  You can place a towel over your head and breathe in the steam from hot water coming from a faucet. Avoid close contacts especially the very young and the elderly. Cover your mouth when you cough or sneeze. Always remember to wash your hands.  Get Help Right Away If: You develop worsening fever or sinus pain. You develop a severe head ache or visual changes. Your symptoms persist after you have completed your treatment plan.  Make sure you Understand these instructions. Will watch your  condition. Will get help right away if you are not doing well or get worse.  Thank you for choosing an e-visit.  Your e-visit answers were reviewed by a board certified advanced clinical practitioner to complete your personal care plan. Depending upon the condition, your plan could have included both over the counter or prescription medications.  Please review your pharmacy choice. Make sure thy. e pharmacy is open so you can pick up prescription now. If there is a problem, you may contact your provider through [employer] have the prescription routed to another pharmacy.  Your safety is important to [place] If you have drug allergies check your prescription carefully.   For the next 24 hours you can use MyChart to ask questions about today's visit, request a non-urgent call back, or ask for a work or school excuse. You will get an email in the next two days asking about your experience. I hope that your e-visit has been valuable and will speed your recover   have provided 5 minutes of non face to face time during this encounter for chart review and documentation.

## 2022-05-08 ENCOUNTER — Emergency Department (HOSPITAL_COMMUNITY): Payer: Medicare PPO

## 2022-05-08 ENCOUNTER — Inpatient Hospital Stay (HOSPITAL_COMMUNITY): Payer: Medicare PPO

## 2022-05-08 ENCOUNTER — Inpatient Hospital Stay (HOSPITAL_COMMUNITY)
Admission: EM | Admit: 2022-05-08 | Discharge: 2022-05-14 | DRG: 871 | Disposition: A | Discharge status: Home Health | Payer: Medicare PPO | Attending: Family Medicine | Admitting: Family Medicine

## 2022-05-08 DIAGNOSIS — A419 Sepsis, unspecified organism: Secondary | ICD-10-CM

## 2022-05-08 DIAGNOSIS — D696 Thrombocytopenia, unspecified: Secondary | ICD-10-CM | POA: Diagnosis not present

## 2022-05-08 DIAGNOSIS — Z79899 Other long term (current) drug therapy: Secondary | ICD-10-CM

## 2022-05-08 DIAGNOSIS — E861 Hypovolemia: Secondary | ICD-10-CM | POA: Diagnosis present

## 2022-05-08 DIAGNOSIS — E1165 Type 2 diabetes mellitus with hyperglycemia: Secondary | ICD-10-CM | POA: Diagnosis present

## 2022-05-08 DIAGNOSIS — I10 Essential (primary) hypertension: Secondary | ICD-10-CM

## 2022-05-08 DIAGNOSIS — J9602 Acute respiratory failure with hypercapnia: Secondary | ICD-10-CM | POA: Diagnosis present

## 2022-05-08 DIAGNOSIS — Z880 Allergy status to penicillin: Secondary | ICD-10-CM

## 2022-05-08 DIAGNOSIS — I472 Ventricular tachycardia, unspecified: Secondary | ICD-10-CM | POA: Diagnosis not present

## 2022-05-08 DIAGNOSIS — N179 Acute kidney failure, unspecified: Secondary | ICD-10-CM | POA: Diagnosis present

## 2022-05-08 DIAGNOSIS — J9601 Acute respiratory failure with hypoxia: Secondary | ICD-10-CM | POA: Diagnosis not present

## 2022-05-08 DIAGNOSIS — Z8249 Family history of ischemic heart disease and other diseases of the circulatory system: Secondary | ICD-10-CM

## 2022-05-08 DIAGNOSIS — I5032 Chronic diastolic (congestive) heart failure: Secondary | ICD-10-CM | POA: Diagnosis present

## 2022-05-08 DIAGNOSIS — N3289 Other specified disorders of bladder: Secondary | ICD-10-CM | POA: Diagnosis not present

## 2022-05-08 DIAGNOSIS — E7800 Pure hypercholesterolemia, unspecified: Secondary | ICD-10-CM | POA: Diagnosis present

## 2022-05-08 DIAGNOSIS — R7881 Bacteremia: Secondary | ICD-10-CM

## 2022-05-08 DIAGNOSIS — R000 Tachycardia, unspecified: Secondary | ICD-10-CM | POA: Diagnosis not present

## 2022-05-08 DIAGNOSIS — Z6841 Body Mass Index (BMI) 40.0 and over, adult: Secondary | ICD-10-CM

## 2022-05-08 DIAGNOSIS — R627 Adult failure to thrive: Secondary | ICD-10-CM | POA: Diagnosis present

## 2022-05-08 DIAGNOSIS — Z885 Allergy status to narcotic agent status: Secondary | ICD-10-CM

## 2022-05-08 DIAGNOSIS — R0602 Shortness of breath: Secondary | ICD-10-CM | POA: Diagnosis not present

## 2022-05-08 DIAGNOSIS — Z91030 Bee allergy status: Secondary | ICD-10-CM

## 2022-05-08 DIAGNOSIS — N1330 Unspecified hydronephrosis: Secondary | ICD-10-CM | POA: Diagnosis not present

## 2022-05-08 DIAGNOSIS — J9811 Atelectasis: Secondary | ICD-10-CM | POA: Diagnosis not present

## 2022-05-08 DIAGNOSIS — G4730 Sleep apnea, unspecified: Secondary | ICD-10-CM | POA: Diagnosis present

## 2022-05-08 DIAGNOSIS — M1990 Unspecified osteoarthritis, unspecified site: Secondary | ICD-10-CM | POA: Diagnosis present

## 2022-05-08 DIAGNOSIS — K589 Irritable bowel syndrome without diarrhea: Secondary | ICD-10-CM | POA: Diagnosis present

## 2022-05-08 DIAGNOSIS — R931 Abnormal findings on diagnostic imaging of heart and coronary circulation: Secondary | ICD-10-CM

## 2022-05-08 DIAGNOSIS — Z1152 Encounter for screening for COVID-19: Secondary | ICD-10-CM | POA: Diagnosis not present

## 2022-05-08 DIAGNOSIS — R0603 Acute respiratory distress: Secondary | ICD-10-CM | POA: Diagnosis not present

## 2022-05-08 DIAGNOSIS — J189 Pneumonia, unspecified organism: Secondary | ICD-10-CM | POA: Diagnosis not present

## 2022-05-08 DIAGNOSIS — Z8719 Personal history of other diseases of the digestive system: Secondary | ICD-10-CM

## 2022-05-08 DIAGNOSIS — Z794 Long term (current) use of insulin: Secondary | ICD-10-CM | POA: Diagnosis not present

## 2022-05-08 DIAGNOSIS — A4151 Sepsis due to Escherichia coli [E. coli]: Secondary | ICD-10-CM | POA: Diagnosis not present

## 2022-05-08 DIAGNOSIS — R6521 Severe sepsis with septic shock: Secondary | ICD-10-CM | POA: Diagnosis present

## 2022-05-08 DIAGNOSIS — N136 Pyonephrosis: Secondary | ICD-10-CM | POA: Diagnosis present

## 2022-05-08 DIAGNOSIS — Z79890 Hormone replacement therapy (postmenopausal): Secondary | ICD-10-CM

## 2022-05-08 DIAGNOSIS — I2489 Other forms of acute ischemic heart disease: Secondary | ICD-10-CM | POA: Diagnosis present

## 2022-05-08 DIAGNOSIS — Z9049 Acquired absence of other specified parts of digestive tract: Secondary | ICD-10-CM | POA: Diagnosis not present

## 2022-05-08 DIAGNOSIS — R7989 Other specified abnormal findings of blood chemistry: Secondary | ICD-10-CM | POA: Diagnosis not present

## 2022-05-08 DIAGNOSIS — Z87440 Personal history of urinary (tract) infections: Secondary | ICD-10-CM

## 2022-05-08 DIAGNOSIS — J90 Pleural effusion, not elsewhere classified: Secondary | ICD-10-CM | POA: Diagnosis not present

## 2022-05-08 DIAGNOSIS — Z881 Allergy status to other antibiotic agents status: Secondary | ICD-10-CM

## 2022-05-08 DIAGNOSIS — F419 Anxiety disorder, unspecified: Secondary | ICD-10-CM | POA: Diagnosis present

## 2022-05-08 DIAGNOSIS — R739 Hyperglycemia, unspecified: Secondary | ICD-10-CM | POA: Diagnosis not present

## 2022-05-08 DIAGNOSIS — Z9884 Bariatric surgery status: Secondary | ICD-10-CM

## 2022-05-08 DIAGNOSIS — Z889 Allergy status to unspecified drugs, medicaments and biological substances status: Secondary | ICD-10-CM

## 2022-05-08 DIAGNOSIS — R0902 Hypoxemia: Secondary | ICD-10-CM | POA: Diagnosis not present

## 2022-05-08 DIAGNOSIS — R34 Anuria and oliguria: Secondary | ICD-10-CM | POA: Diagnosis present

## 2022-05-08 DIAGNOSIS — K760 Fatty (change of) liver, not elsewhere classified: Secondary | ICD-10-CM | POA: Diagnosis not present

## 2022-05-08 DIAGNOSIS — Z882 Allergy status to sulfonamides status: Secondary | ICD-10-CM

## 2022-05-08 DIAGNOSIS — D649 Anemia, unspecified: Secondary | ICD-10-CM | POA: Diagnosis present

## 2022-05-08 DIAGNOSIS — E872 Acidosis, unspecified: Secondary | ICD-10-CM | POA: Diagnosis present

## 2022-05-08 DIAGNOSIS — E1140 Type 2 diabetes mellitus with diabetic neuropathy, unspecified: Secondary | ICD-10-CM

## 2022-05-08 DIAGNOSIS — I509 Heart failure, unspecified: Secondary | ICD-10-CM | POA: Diagnosis not present

## 2022-05-08 DIAGNOSIS — Z888 Allergy status to other drugs, medicaments and biological substances status: Secondary | ICD-10-CM

## 2022-05-08 DIAGNOSIS — R0989 Other specified symptoms and signs involving the circulatory and respiratory systems: Secondary | ICD-10-CM | POA: Diagnosis not present

## 2022-05-08 DIAGNOSIS — I110 Hypertensive heart disease with heart failure: Secondary | ICD-10-CM | POA: Diagnosis present

## 2022-05-08 DIAGNOSIS — Z8261 Family history of arthritis: Secondary | ICD-10-CM

## 2022-05-08 DIAGNOSIS — Z7985 Long-term (current) use of injectable non-insulin antidiabetic drugs: Secondary | ICD-10-CM

## 2022-05-08 DIAGNOSIS — Z90710 Acquired absence of both cervix and uterus: Secondary | ICD-10-CM

## 2022-05-08 HISTORY — DX: Sepsis, unspecified organism: A41.9

## 2022-05-08 LAB — I-STAT ARTERIAL BLOOD GAS, ED
Acid-base deficit: 8 mmol/L — ABNORMAL HIGH (ref 0.0–2.0)
Bicarbonate: 15.1 mmol/L — ABNORMAL LOW (ref 20.0–28.0)
Calcium, Ion: 1.09 mmol/L — ABNORMAL LOW (ref 1.15–1.40)
HCT: 40 % (ref 36.0–46.0)
Hemoglobin: 13.6 g/dL (ref 12.0–15.0)
O2 Saturation: 96 %
Patient temperature: 103.2
Potassium: 3.5 mmol/L (ref 3.5–5.1)
Sodium: 135 mmol/L (ref 135–145)
TCO2: 16 mmol/L — ABNORMAL LOW (ref 22–32)
pCO2 arterial: 28 mmHg — ABNORMAL LOW (ref 32–48)
pH, Arterial: 7.351 (ref 7.35–7.45)
pO2, Arterial: 96 mmHg (ref 83–108)

## 2022-05-08 LAB — CBC WITH DIFFERENTIAL/PLATELET
Abs Immature Granulocytes: 0 10*3/uL (ref 0.00–0.07)
Basophils Absolute: 0 10*3/uL (ref 0.0–0.1)
Basophils Relative: 0 %
Eosinophils Absolute: 0 10*3/uL (ref 0.0–0.5)
Eosinophils Relative: 0 %
HCT: 46.4 % — ABNORMAL HIGH (ref 36.0–46.0)
Hemoglobin: 15.1 g/dL — ABNORMAL HIGH (ref 12.0–15.0)
Lymphocytes Relative: 15 %
Lymphs Abs: 0.7 10*3/uL (ref 0.7–4.0)
MCH: 29.8 pg (ref 26.0–34.0)
MCHC: 32.5 g/dL (ref 30.0–36.0)
MCV: 91.7 fL (ref 80.0–100.0)
Monocytes Absolute: 0 10*3/uL — ABNORMAL LOW (ref 0.1–1.0)
Monocytes Relative: 1 %
Neutro Abs: 4 10*3/uL (ref 1.7–7.7)
Neutrophils Relative %: 84 %
Platelets: 108 10*3/uL — ABNORMAL LOW (ref 150–400)
RBC: 5.06 MIL/uL (ref 3.87–5.11)
RDW: 16.1 % — ABNORMAL HIGH (ref 11.5–15.5)
WBC: 4.8 10*3/uL (ref 4.0–10.5)
nRBC: 2.3 % — ABNORMAL HIGH (ref 0.0–0.2)
nRBC: 9 /100 WBC — ABNORMAL HIGH

## 2022-05-08 LAB — GLUCOSE, CAPILLARY
Glucose-Capillary: 491 mg/dL — ABNORMAL HIGH (ref 70–99)
Glucose-Capillary: 464 mg/dL — ABNORMAL HIGH (ref 70–99)
Glucose-Capillary: 468 mg/dL — ABNORMAL HIGH (ref 70–99)
Glucose-Capillary: 372 mg/dL — ABNORMAL HIGH (ref 70–99)
Glucose-Capillary: 353 mg/dL — ABNORMAL HIGH (ref 70–99)
Glucose-Capillary: 326 mg/dL — ABNORMAL HIGH (ref 70–99)
Glucose-Capillary: 361 mg/dL — ABNORMAL HIGH (ref 70–99)
Glucose-Capillary: 331 mg/dL — ABNORMAL HIGH (ref 70–99)
Glucose-Capillary: 294 mg/dL — ABNORMAL HIGH (ref 70–99)

## 2022-05-08 LAB — ECHOCARDIOGRAM COMPLETE: S' Lateral: 3.1 cm

## 2022-05-08 LAB — APTT: aPTT: 35 seconds (ref 24–36)

## 2022-05-08 LAB — COMPREHENSIVE METABOLIC PANEL
ALT: 34 U/L (ref 0–44)
AST: 39 U/L (ref 15–41)
Albumin: 2.6 g/dL — ABNORMAL LOW (ref 3.5–5.0)
Alkaline Phosphatase: 901 U/L — ABNORMAL HIGH (ref 38–126)
Anion gap: 16 — ABNORMAL HIGH (ref 5–15)
BUN: 41 mg/dL — ABNORMAL HIGH (ref 6–20)
CO2: 20 mmol/L — ABNORMAL LOW (ref 22–32)
Calcium: 8.4 mg/dL — ABNORMAL LOW (ref 8.9–10.3)
Chloride: 100 mmol/L (ref 98–111)
Creatinine, Ser: 2.83 mg/dL — ABNORMAL HIGH (ref 0.44–1.00)
GFR, Estimated: 19 mL/min — ABNORMAL LOW (ref >60)
Glucose, Bld: 269 mg/dL — ABNORMAL HIGH (ref 70–99)
Potassium: 3.7 mmol/L (ref 3.5–5.1)
Sodium: 136 mmol/L (ref 135–145)
Total Bilirubin: 1.7 mg/dL — ABNORMAL HIGH (ref 0.3–1.2)
Total Protein: 6.2 g/dL — ABNORMAL LOW (ref 6.5–8.1)

## 2022-05-08 LAB — HEMOGLOBIN A1C
Hgb A1c MFr Bld: 7.8 % — ABNORMAL HIGH (ref 4.8–5.6)
Mean Plasma Glucose: 177.16 mg/dL

## 2022-05-08 LAB — LACTIC ACID, PLASMA
Lactic Acid, Venous: 7.6 mmol/L (ref 0.5–1.9)
Lactic Acid, Venous: 8.8 mmol/L (ref 0.5–1.9)
Lactic Acid, Venous: 7.1 mmol/L (ref 0.5–1.9)
Lactic Acid, Venous: 6.1 mmol/L (ref 0.5–1.9)

## 2022-05-08 LAB — RESP PANEL BY RT-PCR (RSV, FLU A&B, COVID)  RVPGX2
Influenza A by PCR: NEGATIVE
Influenza B by PCR: NEGATIVE
Resp Syncytial Virus by PCR: NEGATIVE
SARS Coronavirus 2 by RT PCR: NEGATIVE

## 2022-05-08 LAB — PROTIME-INR
INR: 1.3 — ABNORMAL HIGH (ref 0.8–1.2)
Prothrombin Time: 16.2 seconds — ABNORMAL HIGH (ref 11.4–15.2)

## 2022-05-08 LAB — CORTISOL: Cortisol, Plasma: >100 ug/dL

## 2022-05-08 LAB — GAMMA GT: GGT: 219 U/L — ABNORMAL HIGH (ref 7–50)

## 2022-05-08 LAB — BETA-HYDROXYBUTYRIC ACID: Beta-Hydroxybutyric Acid: 0.32 mmol/L — ABNORMAL HIGH (ref 0.05–0.27)

## 2022-05-08 MED ORDER — SODIUM CHLORIDE 0.9 % IV SOLN
1.0000 g | Freq: Three times a day (TID) | INTRAVENOUS | Status: DC
  Administered 2022-05-08 – 2022-05-09 (×2): 1 g via INTRAVENOUS
  Filled 2022-05-08 (×6): qty 5

## 2022-05-08 MED ORDER — POLYETHYLENE GLYCOL 3350 17 G PO PACK
17.0000 g | PACK | Freq: Every day | ORAL | Status: DC | PRN
  Filled 2022-05-08: qty 1

## 2022-05-08 MED ORDER — SODIUM BICARBONATE 8.4 % IV SOLN
INTRAVENOUS | Status: AC
  Filled 2022-05-08: qty 50

## 2022-05-08 MED ORDER — MAGNESIUM SULFATE 2 GM/50ML IV SOLN
2.0000 g | Freq: Once | INTRAVENOUS | Status: AC
  Administered 2022-05-08: 2 g via INTRAVENOUS
  Filled 2022-05-08: qty 50

## 2022-05-08 MED ORDER — PERFLUTREN LIPID MICROSPHERE
1.0000 mL | INTRAVENOUS | Status: AC | PRN
  Administered 2022-05-08: 3.5 mL via INTRAVENOUS

## 2022-05-08 MED ORDER — DOCUSATE SODIUM 100 MG PO CAPS: 100.0000 mg | ORAL_CAPSULE | Freq: Two times a day (BID) | ORAL | Status: DC | PRN

## 2022-05-08 MED ORDER — LACTATED RINGERS IV SOLN
INTRAVENOUS | Status: AC
  Administered 2022-05-08: 1000 mL via INTRAVENOUS

## 2022-05-08 MED ORDER — LACTATED RINGERS IV BOLUS (SEPSIS)
1000.0000 mL | Freq: Once | INTRAVENOUS | Status: AC
  Administered 2022-05-08: 1000 mL via INTRAVENOUS

## 2022-05-08 MED ORDER — LINEZOLID 600 MG/300ML IV SOLN
600.0000 mg | Freq: Two times a day (BID) | INTRAVENOUS | Status: DC
  Administered 2022-05-08: 600 mg via INTRAVENOUS
  Filled 2022-05-08 (×3): qty 300

## 2022-05-08 MED ORDER — FAMOTIDINE 20 MG PO TABS: 20.0000 mg | ORAL_TABLET | Freq: Two times a day (BID) | ORAL | Status: DC

## 2022-05-08 MED ORDER — LACTATED RINGERS IV SOLN: INTRAVENOUS | Status: AC

## 2022-05-08 MED ORDER — NOREPINEPHRINE 4 MG/250ML-% IV SOLN
0.0000 ug/min | INTRAVENOUS | Status: DC
  Administered 2022-05-08: 10 ug/min via INTRAVENOUS
  Administered 2022-05-08: 6 ug/min via INTRAVENOUS
  Administered 2022-05-09: 3 ug/min via INTRAVENOUS
  Filled 2022-05-08 (×2): qty 250

## 2022-05-08 MED ORDER — NOREPINEPHRINE 4 MG/250ML-% IV SOLN
INTRAVENOUS | Status: AC
  Administered 2022-05-08: 2 ug/min via INTRAVENOUS
  Filled 2022-05-08: qty 250

## 2022-05-08 MED ORDER — SODIUM CHLORIDE 0.9 % IV SOLN
2.0000 g | Freq: Once | INTRAVENOUS | Status: AC
  Administered 2022-05-08: 2 g via INTRAVENOUS
  Filled 2022-05-08: qty 10

## 2022-05-08 MED ORDER — DEXTROSE 50 % IV SOLN: 0.0000 mL | INTRAVENOUS | Status: DC | PRN

## 2022-05-08 MED ORDER — LACTATED RINGERS IV BOLUS
1000.0000 mL | Freq: Once | INTRAVENOUS | Status: AC
  Administered 2022-05-08: 1000 mL via INTRAVENOUS

## 2022-05-08 MED ORDER — HYDROCORTISONE SOD SUC (PF) 100 MG IJ SOLR
100.0000 mg | Freq: Two times a day (BID) | INTRAMUSCULAR | Status: DC
  Administered 2022-05-08 – 2022-05-10 (×4): 100 mg via INTRAVENOUS
  Filled 2022-05-08 (×4): qty 2

## 2022-05-08 MED ORDER — SODIUM BICARBONATE 8.4 % IV SOLN
100.0000 meq | Freq: Once | INTRAVENOUS | Status: AC
  Administered 2022-05-08: 100 meq via INTRAVENOUS
  Filled 2022-05-08: qty 50

## 2022-05-08 MED ORDER — FAMOTIDINE 20 MG PO TABS
20.0000 mg | ORAL_TABLET | Freq: Every day | ORAL | Status: DC
  Administered 2022-05-09: 20 mg
  Filled 2022-05-08: qty 1

## 2022-05-08 MED ORDER — LINEZOLID 600 MG/300ML IV SOLN
600.0000 mg | Freq: Once | INTRAVENOUS | Status: AC
  Administered 2022-05-08: 600 mg via INTRAVENOUS
  Filled 2022-05-08: qty 300

## 2022-05-08 MED ORDER — INSULIN REGULAR(HUMAN) IN NACL 100-0.9 UT/100ML-% IV SOLN
INTRAVENOUS | Status: DC
  Administered 2022-05-08: 14 [IU]/h via INTRAVENOUS
  Administered 2022-05-09: 5.5 [IU]/h via INTRAVENOUS
  Filled 2022-05-08 (×3): qty 100

## 2022-05-08 MED ORDER — HEPARIN SODIUM (PORCINE) 5000 UNIT/ML IJ SOLN
5000.0000 [IU] | Freq: Three times a day (TID) | INTRAMUSCULAR | Status: DC
  Administered 2022-05-08 – 2022-05-14 (×17): 5000 [IU] via SUBCUTANEOUS
  Filled 2022-05-08 (×17): qty 1

## 2022-05-08 NOTE — ED Notes (Signed)
Dr. Vanita Panda notified of pt BP. See orders

## 2022-05-08 NOTE — Progress Notes (Signed)
Notified bedside nurse of need to draw repeat lactic acid. 

## 2022-05-08 NOTE — Progress Notes (Signed)
Elink following code sepsis °

## 2022-05-08 NOTE — Progress Notes (Signed)
Inpatient Diabetes Program Recommendations  AACE/ADA: New Consensus Statement on Inpatient Glycemic Control (2015)  Target Ranges:  Prepandial:   less than 140 mg/dL      Peak postprandial:   less than 180 mg/dL (1-2 hours)      Critically ill patients:  140 - 180 mg/dL   Lab Results  Component Value Date   GLUCAP 491 (H) 05/08/2022   HGBA1C 7.1 05/18/2020    Review of Glycemic Control  Latest Reference Range & Units 05/08/22 14:18  Glucose-Capillary 70 - 99 mg/dL 491 (H)   Diabetes history: DM Outpatient Diabetes medications:  Tresiba 40 units bid Novolog 22-26 units tid with meals Jardiance 25 mg daily Duaglutide 4.5 mg weekly Current orders for Inpatient glycemic control:  IV insulin- goal 140-180 mg/dL  Inpatient Diabetes Program Recommendations:    Agree with orders to start IV insulin.  Will follow.   Thanks,  Adah Perl, RN, BC-ADM Inpatient Diabetes Coordinator Pager [phone]  (8a-5p)

## 2022-05-08 NOTE — Procedures (Signed)
Central Venous Catheter Insertion Procedure Note  [name]  [id number]  [birth year]  Date:05/08/22  Time:6:08 PM   Provider Performing:Alexande Sheerin Viona Gilmore [place]   Procedure: Insertion of Non-tunneled Central Venous [phone]) with US guidance [id number])   Indication(s) Medication administration  Consent Risks of the procedure as well as the alternatives and risks of each were explained to the patient and/or caregiver.  Consent for the procedure was obtained and is signed in the bedside chart  Anesthesia Topical only with 1% lidocaine   Timeout Verified patient identification, verified procedure, site/side was marked, verified correct patient position, special equipment/implants available, medications/allergies/relevant history reviewed, required imaging and test results available.  Sterile Technique Maximal sterile technique including full sterile barrier drape, hand hygiene, sterile gown, sterile gloves, mask, hair covering, sterile ultrasound probe cover (if used).  Procedure Description Area of catheter insertion was cleaned with chlorhexidine and draped in sterile fashion.  With real-time ultrasound guidance a central venous catheter was placed into the left internal jugular vein. Nonpulsatile blood flow and easy flushing noted in all ports.  The catheter was sutured in place and sterile dressing applied.  Complications/Tolerance None; patient tolerated the procedure well. Chest X-ray is ordered to verify placement for internal jugular or subclavian cannulation.   Chest x-ray is not ordered for femoral cannulation.  EBL Minimal  Specimen(s) None    Georgann Housekeeper, AGACNP-BC [facility]  See Amion for personal pager PCCM on call pager [phone] until 7pm. Please call Elink 7p-7a. [phone]  05/08/2022 6:08 PM

## 2022-05-08 NOTE — Procedures (Signed)
Arterial Catheter Insertion Procedure Note  GRAYSEN JAROSH  [id number]  10/27/1965  Date:05/08/22  Time:4:30 PM    Provider Performing: Corey Harold    Procedure: Insertion of Arterial Line [phone]) with US guidance [id number])   Indication(s) Blood pressure monitoring and/or need for frequent ABGs  Consent Risks of the procedure as well as the alternatives and risks of each were explained to the patient and/or caregiver.  Consent for the procedure was obtained and is signed in the bedside chart  Anesthesia None   Time Out Verified patient identification, verified procedure, site/side was marked, verified correct patient position, special equipment/implants available, medications/allergies/relevant history reviewed, required imaging and test results available.   Sterile Technique Maximal sterile technique including full sterile barrier drape, hand hygiene, sterile gown, sterile gloves, mask, hair covering, sterile ultrasound probe cover (if used).   Procedure Description Area of catheter insertion was cleaned with chlorhexidine and draped in sterile fashion. With real-time ultrasound guidance an arterial catheter was placed into the left radial artery.  Appropriate arterial tracings confirmed on monitor.     Complications/Tolerance None; patient tolerated the procedure well.   EBL Minimal   Specimen(s) None    Georgann Housekeeper, AGACNP-BC [facility]  See Amion for personal pager PCCM on call pager [phone] until 7pm. Please call Elink 7p-7a. [phone]  05/08/2022 4:30 PM

## 2022-05-08 NOTE — ED Provider Notes (Signed)
[facility] Provider Note   CSN: [id number] Arrival date & time: 05/08/22  1006     History  Chief Complaint  Patient presents with   Shortness of Breath    [name] is a 57 y.o. female.  HPI Patient presents with her husband and her daughter.  Both of those individuals assists with the history.  She presents with concern for 1 week of respiratory illness now with associated nausea, vomiting with coughing.  No persistent abdominal pain though she does have discomfort with coughing.  She was unaware of fever until she arrived here.  Patient knowledges multiple medical problems including diabetes, hypertension, hyperlipidemia.  Over the week of the illness she is gotten progressively worse, weaker. No relief with anything.  After speaking with her physician she was started on doxycycline yesterday.    Home Medications Prior to Admission medications   Medication Sig Start Date End Date Taking? Authorizing Provider  acetaminophen (TYLENOL) 650 MG CR tablet Take 650 mg by mouth at bedtime.    [provider]  albuterol (PROVENTIL) (2.5 MG/3ML) 0.083% nebulizer solution Take 3 mLs (2.5 mg total) by nebulization every 6 (six) hours as needed for wheezing or shortness of breath. 01/12/22   Tonia Ghent, MD  albuterol (VENTOLIN HFA) 108 (90 Base) MCG/ACT inhaler Inhale 1-2 puffs into the lungs every 6 (six) hours as needed for wheezing or shortness of breath. 01/12/22   Tonia Ghent, MD  atorvastatin (LIPITOR) 40 MG tablet Take 1 tablet (40 mg total) by mouth daily. for cholesterol. 08/12/21   Pleas Koch, NP  chlorpheniramine-HYDROcodone (TUSSIONEX) 10-8 MG/5ML Take 5 mLs by mouth every 12 (twelve) hours as needed for cough. 01/19/22   Pleas Koch, NP  Continuous Blood Gluc Receiver ([place]) [place] by Does not apply route.    [provider]  Continuous Blood Gluc Sensor ([place]) MISC  by Does not apply route.    [provider]  cyclobenzaprine (FLEXERIL) 10 MG tablet TAKE 1 TABLET BY MOUTH 3 TIMES DAILY AS NEEDED FOR MUSCLE SPASMS 01/25/22   Pleas Koch, NP  doxycycline (VIBRA-TABS) 100 MG tablet Take 1 tablet (100 mg total) by mouth 2 (two) times daily. 05/06/22   Tempie Hoist, FNP  Dulaglutide (TRULICITY) 3 0000000 SOPN Inject into the skin. 05/18/20   [provider]  empagliflozin (JARDIANCE) 25 MG TABS tablet Take 1 tablet (25 mg total) by mouth daily before breakfast. For diabetes. 05/12/20   Pleas Koch, NP  ergocalciferol (VITAMIN D2) 1.25 MG (50000 UT) capsule Take by mouth. 11/14/18   [provider]  fexofenadine (ALLEGRA) 180 MG tablet 1 tablet    [provider]  FLUoxetine (PROZAC) 40 MG capsule TAKE 1 CAPSULE BY MOUTH ONCE DAILY FOR ANXIETY AND DEPRESSION 06/19/21   Pleas Koch, NP  fluticasone (FLONASE) 50 MCG/ACT nasal spray Place 1 spray into both nostrils 2 (two) times daily. 05/25/20   Pleas Koch, NP  gabapentin (NEURONTIN) 600 MG tablet Take 1 tablet (600 mg total) by mouth in the morning and at bedtime. For neuropathy 06/14/21   Pleas Koch, NP  Glucagon 3 MG/DOSE POWD Place into the nose. 02/18/20   [provider]  hydrochlorothiazide (HYDRODIURIL) 12.5 MG tablet Take 1 tablet (12.5 mg total) by mouth daily. for blood pressure. 06/19/21   Pleas Koch, NP  insulin aspart (NOVOLOG FLEXPEN) 100 UNIT/ML FlexPen Inject 22-26 Units  into the skin 3 (three) times daily with meals. 12/01/19   Pleas Koch, NP  insulin degludec (TRESIBA) 100 UNIT/ML FlexTouch Pen Inject 40 Units into the skin 2 (two) times daily. 01/12/22   Tonia Ghent, MD  Insulin Pen Needle 32G X 4 MM MISC Use 1 needle with pen as directed 09/20/19   Pleas Koch, NP  levocetirizine (XYZAL) 5 MG tablet TAKE ONE TABLET BY MOUTH EVERY EVENING 07/16/17   Pleas Koch, NP  losartan (COZAAR) 50 MG tablet  TAKE 1 TABLET BY MOUTH ONCE DAILY FOR BLOOD PRESSURE 08/25/21   Pleas Koch, NP  metoprolol tartrate (LOPRESSOR) 25 MG tablet TAKE 0.5 TABLET BY MOUTH TWICE DAILY FORBLOOD PRESSURE 12/19/21   Pleas Koch, NP  Multiple Vitamin (MULTIVITAMIN PO) Take 1 tablet by mouth daily.     [provider]  Nebulizer MISC Dispense 1 nebulizer for home use, with tubing and supplies. J45.909. 01/12/22   Tonia Ghent, MD  ondansetron (ZOFRAN-ODT) 4 MG disintegrating tablet TAKE 1 TABLET BY MOUTH EVERY 8 HOURS AS NEEDED FOR NAUSEA AS DIRECTED. 04/16/22   Pleas Koch, NP  VALERIAN ROOT PO Take 1,000 mg by mouth at bedtime.    [provider]      Allergies    Amoxicillin, Bee venom, Levofloxacin, Meloxicam, Vancomycin, Sulfa antibiotics, Ace inhibitors, Codeine, Meperidine hcl, Oxycodone-acetaminophen, Tyloxapol, Penicillins, and Tequin [gatifloxacin]    Review of Systems   Review of Systems  All other systems reviewed and are negative.   Physical Exam Updated Vital Signs BP (!) 78/47   Pulse (!) 123   Temp (!) 102.1 F (38.9 C) (Axillary)   Resp (!) 32   SpO2 95%  Physical Exam Vitals and nursing note reviewed.  Constitutional:      General: She is in acute distress.     Appearance: She is obese. She is ill-appearing and diaphoretic.  HENT:     Head: Normocephalic and atraumatic.  Eyes:     Conjunctiva/sclera: Conjunctivae normal.  Cardiovascular:     Rate and Rhythm: Regular rhythm. Tachycardia present.  Pulmonary:     Effort: Tachypnea and accessory muscle usage present.     Breath sounds: Decreased breath sounds present.  Abdominal:     General: There is no distension.     Comments: Protuberant, but not tender abdomen  Musculoskeletal:     Right lower leg: Edema present.     Left lower leg: Edema present.  Skin:    General: Skin is warm.  Neurological:     Mental Status: She is alert and oriented to person, place, and time.     Cranial Nerves:  No cranial nerve deficit.  Psychiatric:        Mood and Affect: Mood normal.     ED Results / Procedures / Treatments   Labs (all labs ordered are listed, but only abnormal results are displayed) Labs Reviewed  RESP PANEL BY RT-PCR (RSV, FLU A&B, COVID)  [id number]  CULTURE, BLOOD (ROUTINE X 2)  CULTURE, BLOOD (ROUTINE X 2)  LACTIC ACID, PLASMA  LACTIC ACID, PLASMA  COMPREHENSIVE METABOLIC PANEL  CBC WITH DIFFERENTIAL/PLATELET  PROTIME-INR  APTT  URINALYSIS, ROUTINE W REFLEX MICROSCOPIC  I-STAT BETA HCG BLOOD, ED ([facility], [facility], [facility] ONLY)    EKG EKG Interpretation  Date/Time:  Monday May 08 2022 10:34:13 EST Ventricular Rate:  129 PR Interval:  101 QRS Duration: 94 QT Interval:  363 QTC Calculation: 532 R Axis:   17  Text Interpretation: Sinus tachycardia Prolonged QT interval Abnormal ECG Confirmed by Carmin Muskrat [phone]) on 05/08/2022 11:11:26 AM  Radiology DG Chest Port 1 View  Result Date: 05/08/2022 CLINICAL DATA:  Questionable sepsis - evaluate for abnormality EXAM: PORTABLE CHEST - 1 VIEW COMPARISON:  07/06/2014 FINDINGS: Cardiac silhouette is prominent. There is pulmonary interstitial prominence with vascular congestion. No focal consolidation. No pneumothorax or pleural effusion identified. IMPRESSION: Findings suggest CHF. Electronically Signed   By: Sammie Bench M.D.   On: 05/08/2022 10:54    Procedures Procedures    Medications Ordered in ED Medications  aztreonam (AZACTAM) 2 g in sodium chloride 0.9 % 100 mL IVPB (2 g Intravenous New Bag/Given 05/08/22 1107)  linezolid (ZYVOX) IVPB 600 mg (600 mg Intravenous New Bag/Given 05/08/22 1129)  lactated ringers bolus 1,000 mL (1,000 mLs Intravenous New Bag/Given 05/08/22 1031)    ED Course/ Medical Decision Making/ A&P                             Medical Decision Making Obese adult female with multiple medical problems including hyperlipidemia, diabetes, hypertension presents in respiratory distress, is  tachypneic, tachycardic, febrile.  Code sepsis activated.  Labs sent empiric antibiotic started after discussion with pharmacy, fluids sent.  Amount and/or Complexity of Data Reviewed Independent Historian: spouse External Data Reviewed: notes. Labs: ordered. Decision-making details documented in ED Course. Radiology: ordered and independent interpretation performed. Decision-making details documented in ED Course. ECG/medicine tests: ordered and independent interpretation performed. Decision-making details documented in ED Course.  Risk Prescription drug management. Decision regarding hospitalization.  11:37 AM Patient persistently hypotensive, initial fluids are running.  Given her fever, tachycardia, hypoxia, acute cardiomyopathy remains a consideration.  She has no history of heart failure, as confirmed by family members and her, as well as chart review. I have discussed arranging a stat echocardiogram with her cardiology team.  Antibiotics to be discussed with pharmacy.  11:56 AM Blood pressure continues to decline in spite of fluids, Levophed starting, echocardiogram staff at bedside.   Update: I discussed the patient's case with our critical care team at bedside.  Patient's blood pressure has improved marginally with Levophed, initial fluids.  With concern for sepsis, acute organ dysfunction, renal failure, no obvious source of infection, COVID-negative, influenza negative, patient will be admitted to the ICU for ongoing monitoring, management.  Bedside echocardiogram results pending.        Final Clinical Impression(s) / ED Diagnoses Final diagnoses:  Septic shock ([place])    CRITICAL CARE Performed by: Carmin Muskrat Total critical care time: 50 minutes Critical care time was exclusive of separately billable procedures and treating other patients. Critical care was necessary to treat or prevent imminent or life-threatening deterioration. Critical care was time spent  personally by me on the following activities: development of treatment plan with patient and/or surrogate as well as nursing, discussions with consultants, evaluation of patient's response to treatment, examination of patient, obtaining history from patient or surrogate, ordering and performing treatments and interventions, ordering and review of laboratory studies, ordering and review of radiographic studies, pulse oximetry and re-evaluation of patient's condition.    Carmin Muskrat, MD 05/08/22 1359

## 2022-05-08 NOTE — Progress Notes (Signed)
Notified provider and bedside nurse of need to order and draw repeat lactic acid #3.

## 2022-05-08 NOTE — Progress Notes (Signed)
  Echocardiogram 2D Echocardiogram has been performed.  [name] 05/08/2022, 12:38 PM

## 2022-05-08 NOTE — ED Notes (Signed)
RN and tech attempted in and out catheter. Urine output was 33m and unable to sent sample at this time

## 2022-05-08 NOTE — H&P (Addendum)
NAME:  [name], MRN:  [id number], DOB:  [birth year], LOS: 0 ADMISSION DATE:  05/08/2022, CONSULTATION DATE:  2/19 REFERRING MD:  Dr. Vanita Panda, CHIEF COMPLAINT:  Shock   History of Present Illness:  57 year old female with past medical history as below, which is significant for diabetes, asthma, hypertension, MRSA infection, GERD, thyroid disease, and sleep apnea.  She presented to [facility] emergency department on 2/19 with complaints of URI symptoms and malaise x 6 days now complicated by nausea and vomiting x 24 hours.  1 day prior to arrival she was started on doxycycline by PCP.  Upon arrival to the emergency department she was noted to be febrile and hypotensive.  With concern for sepsis she was started immediately on empiric antibiotics and provided with IVF resuscitation.  Lactic acid elevated at 7.  Despite IVF resuscitation she remained hypotensive and was started on norepinephrine infusion. Echocardiogram in the ED did not demonstrate any significant cardiac dysfunction. PCCM asked to admit.   Pertinent  Medical History   has a past medical history of Allergy, Amyotrophic lateral sclerosis (ALS) ([place]) (09/23/2018), Anemia, Anxiety, Arthritis, Asthma, Colon polyp, Complication of anesthesia, Diabetes mellitus, Displaced fracture of proximal end of right fibula (07/21/2017), Edema, Fatigue, Hypertension, IBS (irritable bowel syndrome), Lump in female breast, Methicillin resistant Staphylococcus aureus in conditions classified elsewhere and of unspecified site, Migraines, Morbid obesity ([place]), Night sweats, Nonspecific abnormal results of thyroid function study, Other B-complex deficiencies, Paresthesias, PONV (postoperative nausea and vomiting), Pure hypercholesterolemia, Reflux, Sacral fracture ([place]), Sinus problem, Symptomatic states associated with artificial menopause, Thyroid disease, Type II or unspecified type diabetes mellitus without mention of complication, not stated as uncontrolled, and  Unspecified sleep apnea.   Significant Hospital Events: Including procedures, antibiotic start and stop dates in addition to other pertinent events   2/19 admit  Interim History / Subjective:    Objective   Blood pressure (!) 196/79, pulse (!) 36, temperature (!) 102.1 F (38.9 C), temperature source Axillary, resp. rate (!) 28, SpO2 96 %.        Intake/Output Summary (Last 24 hours) at 05/08/2022 1318 Last data filed at 05/08/2022 1255 Gross per 24 hour  Intake 300 ml  Output --  Net 300 ml   There were no vitals filed for this visit.  Examination: General: Obese middle aged female in NAD. HENT: [place]/AT, PERRL, unable to appreciate JVD. Lungs: Clear bilateral breath sounds. Cardiovascular: Tachy, regular, no MG. Abdomen: Soft, NT, ND. Extremities: No acute deformity or ROM limitation. Neuro: Awake, alert. Will respond to voice, but intermittently.  GU: Foley  Resolved Hospital Problem list     Assessment & Plan:   Septic shock: etiology uncertain. UA pending. CXR c/w CHF. CT chest and abdomen pending. ? Adrenal AI. IVC down on echo.  - Admit to ICU - Empiric aztreonam and linezolid (multiple drug allergies) - NE for map goal 65 mmHg - s/p 2 L IVF. Will gently give more. EF mildly reduced.  - Follow culture - CT pending - Check cortisol then Stress steroids  Acute respiratory failure with hypercarbia: Compensating currently, but she is at risk of tiring out.  - Low threshold to intubate - Not a candidate for BiPAP currently due to mental status.   DM with hyperglycemia can't rule out DKA - Fluids, insulin, endotool.  - Check urine for ketones, check BHB - Hold home Jardiance   AKI: - hydrate - Trend BMP - BP support  Lactic acidosis - trend lactic.  - BP support -  Bicarb x 2 given in ED.   HTN - holding home metoprolol  Best Practice (right click and "Reselect all SmartList Selections" daily)   Diet/type: NPO DVT prophylaxis: prophylactic heparin   GI prophylaxis: H2B Lines: N/A Foley:  Yes, and it is still needed Code Status:  full code Last date of multidisciplinary goals of care discussion [ ]$   Labs   CBC: Recent Labs  Lab 05/08/22 1026 05/08/22 1250  WBC 4.8  --   NEUTROABS 4.0  --   HGB 15.1* 13.6  HCT 46.4* 40.0  MCV 91.7  --   PLT 108*  --     Basic Metabolic Panel: Recent Labs  Lab 05/08/22 1026 05/08/22 1250  NA 136 135  K 3.7 3.5  CL 100  --   CO2 20*  --   GLUCOSE 269*  --   BUN 41*  --   CREATININE 2.83*  --   CALCIUM 8.4*  --    GFR: CrCl cannot be calculated (Unknown ideal weight.). Recent Labs  Lab 05/08/22 1026  WBC 4.8  LATICACIDVEN 7.6*    Liver Function Tests: Recent Labs  Lab 05/08/22 1026  AST 39  ALT 34  ALKPHOS 901*  BILITOT 1.7*  PROT 6.2*  ALBUMIN 2.6*   No results for input(s): "LIPASE", "AMYLASE" in the last 168 hours. No results for input(s): "AMMONIA" in the last 168 hours.  ABG    Component Value Date/Time   PHART 7.351 05/08/2022 1250   PCO2ART 28.0 (L) 05/08/2022 1250   PO2ART 96 05/08/2022 1250   HCO3 15.1 (L) 05/08/2022 1250   TCO2 16 (L) 05/08/2022 1250   ACIDBASEDEF 8.0 (H) 05/08/2022 1250   O2SAT 96 05/08/2022 1250     Coagulation Profile: Recent Labs  Lab 05/08/22 1026  INR 1.3*    Cardiac Enzymes: No results for input(s): "CKTOTAL", "CKMB", "CKMBINDEX", "TROPONINI" in the last 168 hours.  HbA1C: Hemoglobin A1C  Date/Time Value Ref Range Status  05/18/2020 12:00 AM 7.1  Final    Comment:    in [place] note   10/01/2019 08:39 AM 10.6 (A) 4.0 - 5.6 % Final  01/30/2019 08:02 AM 10.1 (A) 4.0 - 5.6 % Final   Hgb A1c MFr Bld  Date/Time Value Ref Range Status  07/15/2018 08:31 AM 9.4 (H) 4.6 - 6.5 % Final    Comment:    Glycemic Control Guidelines for People with Diabetes:Non Diabetic:  <6%Goal of Therapy: <7%Additional Action Suggested:  >8%   01/31/2018 03:56 PM 10.3 (H) 4.8 - 5.6 % Final    Comment:    (NOTE) Pre diabetes:           5.7%-6.4% Diabetes:              >6.4% Glycemic control for   <7.0% adults with diabetes     CBG: No results for input(s): "GLUCAP" in the last 168 hours.  Review of Systems:   Patient is encephalopathic and/or intubated. Therefore history has been obtained from chart review.   Past Medical History:  She,  has a past medical history of Allergy, Amyotrophic lateral sclerosis (ALS) ([place]) (09/23/2018), Anemia, Anxiety, Arthritis, Asthma, Colon polyp, Complication of anesthesia, Diabetes mellitus, Displaced fracture of proximal end of right fibula (07/21/2017), Edema, Fatigue, Hypertension, IBS (irritable bowel syndrome), Lump in female breast, Methicillin resistant Staphylococcus aureus in conditions classified elsewhere and of unspecified site, Migraines, Morbid obesity ([place]), Night sweats, Nonspecific abnormal results of thyroid function study, Other B-complex deficiencies, Paresthesias, PONV (postoperative nausea and  vomiting), Pure hypercholesterolemia, Reflux, Sacral fracture ([place]), Sinus problem, Symptomatic states associated with artificial menopause, Thyroid disease, Type II or unspecified type diabetes mellitus without mention of complication, not stated as uncontrolled, and Unspecified sleep apnea.   Surgical History:   Past Surgical History:  Procedure Laterality Date   abcess removal  1997   MRSA   ABDOMINAL HYSTERECTOMY  1997   complete in 1997, partial was in 1995   carpal tunnel release r  07/03/13   [place]  03/1989   COLONOSCOPY  03/21/2011   Normal.  [facility].   KNEE SURGERY  2006   LAPAROSCOPIC GASTRIC BANDING  05/2009   TONSILLECTOMY AND ADENOIDECTOMY  1974   TUBAL LIGATION     ULNAR TUNNEL RELEASE Right 02/05/2018   Procedure: CUBITAL TUNNEL RELEASE/DECOMPRESSION;  Surgeon: Melrose Nakayama, MD;  Location: [place];  Service: Orthopedics;  Laterality: Right;     Social History:   reports that she has never smoked.  She has never used smokeless tobacco. She reports that she does not currently use alcohol. She reports that she does not use drugs.   Family History:  Her family history includes Alzheimer's disease in her maternal grandmother; Arthritis in her mother; Breast cancer in an other family member; Crohn's disease in her daughter; Diabetes in her brother, father, and mother; Emphysema in her maternal grandfather; Fibromyalgia in her daughter; Heart disease in her brother, father, and paternal grandmother; Heart disease (age of onset: 52) in her mother; Hyperlipidemia in her mother; Hypertension in her mother and son; Leukemia (age of onset: 75) in her mother; Other in her mother; Stroke in her father.   Allergies Allergies  Allergen Reactions   Amoxicillin Shortness Of Breath and Rash   Bee Venom Anaphylaxis   Levofloxacin Shortness Of Breath and Rash   Meloxicam Hives and Rash   Vancomycin Anaphylaxis   Sulfa Antibiotics Rash   Ace Inhibitors Cough   Codeine    Meperidine Hcl    Oxycodone-Acetaminophen    Tyloxapol     Other reaction(s): vomiting/rash   Penicillins Swelling and Rash    Has patient had a PCN reaction causing immediate rash, facial/tongue/throat swelling, SOB or lightheadedness with hypotension: Yes Has patient had a PCN reaction causing severe rash involving mucus membranes or skin necrosis: Yes Has patient had a PCN reaction that required hospitalization: No Has patient had a PCN reaction occurring within the last 10 years: No If all of the above answers are "NO", then may proceed with Cephalosporin use.    Tequin [Gatifloxacin] Rash     Home Medications  Prior to Admission medications   Medication Sig Start Date End Date Taking? Authorizing Provider  acetaminophen (TYLENOL) 650 MG CR tablet Take 650 mg by mouth at bedtime.    [provider]  albuterol (PROVENTIL) (2.5 MG/3ML) 0.083% nebulizer solution Take 3 mLs (2.5 mg total) by nebulization every 6 (six)  hours as needed for wheezing or shortness of breath. 01/12/22   Tonia Ghent, MD  albuterol (VENTOLIN HFA) 108 (90 Base) MCG/ACT inhaler Inhale 1-2 puffs into the lungs every 6 (six) hours as needed for wheezing or shortness of breath. 01/12/22   Tonia Ghent, MD  atorvastatin (LIPITOR) 40 MG tablet Take 1 tablet (40 mg total) by mouth daily. for cholesterol. 08/12/21   Pleas Koch, NP  chlorpheniramine-HYDROcodone (TUSSIONEX) 10-8 MG/5ML Take 5 mLs by mouth every 12 (twelve) hours as needed for  cough. 01/19/22   Pleas Koch, NP  Continuous Blood Gluc Receiver ([place]) [place] by Does not apply route.    [provider]  Continuous Blood Gluc Sensor ([place]) MISC by Does not apply route.    [provider]  cyclobenzaprine (FLEXERIL) 10 MG tablet TAKE 1 TABLET BY MOUTH 3 TIMES DAILY AS NEEDED FOR MUSCLE SPASMS 01/25/22   Pleas Koch, NP  doxycycline (VIBRA-TABS) 100 MG tablet Take 1 tablet (100 mg total) by mouth 2 (two) times daily. 05/06/22   Tempie Hoist, FNP  Dulaglutide (TRULICITY) 3 0000000 SOPN Inject into the skin. 05/18/20   [provider]  empagliflozin (JARDIANCE) 25 MG TABS tablet Take 1 tablet (25 mg total) by mouth daily before breakfast. For diabetes. 05/12/20   Pleas Koch, NP  ergocalciferol (VITAMIN D2) 1.25 MG (50000 UT) capsule Take by mouth. 11/14/18   [provider]  fexofenadine (ALLEGRA) 180 MG tablet 1 tablet    [provider]  FLUoxetine (PROZAC) 40 MG capsule TAKE 1 CAPSULE BY MOUTH ONCE DAILY FOR ANXIETY AND DEPRESSION 06/19/21   Pleas Koch, NP  fluticasone (FLONASE) 50 MCG/ACT nasal spray Place 1 spray into both nostrils 2 (two) times daily. 05/25/20   Pleas Koch, NP  gabapentin (NEURONTIN) 600 MG tablet Take 1 tablet (600 mg total) by mouth in the morning and at bedtime. For neuropathy 06/14/21   Pleas Koch, NP  Glucagon 3 MG/DOSE POWD Place into the nose.  02/18/20   [provider]  hydrochlorothiazide (HYDRODIURIL) 12.5 MG tablet Take 1 tablet (12.5 mg total) by mouth daily. for blood pressure. 06/19/21   Pleas Koch, NP  insulin aspart (NOVOLOG FLEXPEN) 100 UNIT/ML FlexPen Inject 22-26 Units into the skin 3 (three) times daily with meals. 12/01/19   Pleas Koch, NP  insulin degludec (TRESIBA) 100 UNIT/ML FlexTouch Pen Inject 40 Units into the skin 2 (two) times daily. 01/12/22   Tonia Ghent, MD  Insulin Pen Needle 32G X 4 MM MISC Use 1 needle with pen as directed 09/20/19   Pleas Koch, NP  levocetirizine (XYZAL) 5 MG tablet TAKE ONE TABLET BY MOUTH EVERY EVENING 07/16/17   Pleas Koch, NP  losartan (COZAAR) 50 MG tablet TAKE 1 TABLET BY MOUTH ONCE DAILY FOR BLOOD PRESSURE 08/25/21   Pleas Koch, NP  metoprolol tartrate (LOPRESSOR) 25 MG tablet TAKE 0.5 TABLET BY MOUTH TWICE DAILY FORBLOOD PRESSURE 12/19/21   Pleas Koch, NP  Multiple Vitamin (MULTIVITAMIN PO) Take 1 tablet by mouth daily.     [provider]  Nebulizer MISC Dispense 1 nebulizer for home use, with tubing and supplies. J45.909. 01/12/22   Tonia Ghent, MD  ondansetron (ZOFRAN-ODT) 4 MG disintegrating tablet TAKE 1 TABLET BY MOUTH EVERY 8 HOURS AS NEEDED FOR NAUSEA AS DIRECTED. 04/16/22   Pleas Koch, NP  VALERIAN ROOT PO Take 1,000 mg by mouth at bedtime.    [provider]     Critical care time: 55 minutes     Georgann Housekeeper, AGACNP-BC [place]  See Amion for personal pager PCCM on call pager [phone] until 7pm. Please call Elink 7p-7a. [phone]  05/08/2022 2:39 PM

## 2022-05-08 NOTE — Progress Notes (Signed)
Pharmacy Antibiotic Note  [name] is a 57 y.o. female admitted on 05/08/2022 with sepsis.  Pharmacy has been consulted for aztreonam dosing. Scr elevated at 2.83. With most recent weight of 311 lbs, CrCl using adjusted BW ~30 mL/min. Tmax 102.85F. WBC wnl.   Plan: Aztreonam 1g IV q8h  Monitor C&S, s/sx improvement, renal function, LOT   No data recorded.  No results for input(s): "WBC", "CREATININE", "LATICACIDVEN", "VANCOTROUGH", "VANCOPEAK", "VANCORANDOM", "GENTTROUGH", "GENTPEAK", "GENTRANDOM", "TOBRATROUGH", "TOBRAPEAK", "TOBRARND", "AMIKACINPEAK", "AMIKACINTROU", "AMIKACIN" in the last 168 hours.  CrCl cannot be calculated (Patient's most recent lab result is older than the maximum 21 days allowed.).    Allergies  Allergen Reactions   Amoxicillin Shortness Of Breath and Rash   Bee Venom Anaphylaxis   Levofloxacin Shortness Of Breath and Rash   Meloxicam Hives and Rash   Vancomycin Anaphylaxis   Sulfa Antibiotics Rash   Ace Inhibitors Cough   Codeine    Meperidine Hcl    Oxycodone-Acetaminophen    Tyloxapol     Other reaction(s): vomiting/rash   Penicillins Swelling and Rash    Has patient had a PCN reaction causing immediate rash, facial/tongue/throat swelling, SOB or lightheadedness with hypotension: Yes Has patient had a PCN reaction causing severe rash involving mucus membranes or skin necrosis: Yes Has patient had a PCN reaction that required hospitalization: No Has patient had a PCN reaction occurring within the last 10 years: No If all of the above answers are "NO", then may proceed with Cephalosporin use.    Tequin [Gatifloxacin] Rash    Antimicrobials this admission: aztreonam 2/19 >>  linezolid 2/19 x 1   Microbiology results: 2/19 BCx: sent 2/19 RVP: neg  Thank you for allowing pharmacy to be a part of this patient's care.  Eliseo Gum, PharmD PGY1 Pharmacy Resident   05/08/2022  12:04 PM

## 2022-05-08 NOTE — ED Triage Notes (Signed)
Pt bib EMS coming from home. Reports cold like symptoms with cough x 1 week. Went to PCP yesterday for SOB and started on abx. Today SOB worsening. EMS vitals as follows: temp 102.2f BP 108/70, O2 80's on room air increased to 96% 4L [place]. Pt alert and oriented on ED arrival. 6513mtylenol given by EMS

## 2022-05-08 NOTE — ED Notes (Signed)
Dr Vanita Panda notified of pt BP

## 2022-05-08 NOTE — Progress Notes (Signed)
ABG ordered and obtained with patient on 4L [place].  No further changes at this time.    Latest Reference Range & Units 05/08/22 12:50  Sample type  ARTERIAL  pH, Arterial 7.35 - 7.45  7.351  pCO2 arterial 32 - 48 mmHg 28.0 (L)  pO2, Arterial 83 - 108 mmHg 96  TCO2 22 - 32 mmol/L 16 (L)  Acid-base deficit 0.0 - 2.0 mmol/L 8.0 (H)  Bicarbonate 20.0 - 28.0 mmol/L 15.1 (L)  O2 Saturation % 96  Patient temperature  103.2 F  Collection site  RADIAL, ALLEN'S TEST ACCEPTABLE

## 2022-05-08 NOTE — Progress Notes (Signed)
Notified provider and bedside nurse of need to order and administer fluid bolus, pt needs 4218 cc fluid.

## 2022-05-09 ENCOUNTER — Inpatient Hospital Stay (HOSPITAL_COMMUNITY): Payer: Medicare PPO

## 2022-05-09 DIAGNOSIS — R6521 Severe sepsis with septic shock: Secondary | ICD-10-CM | POA: Diagnosis not present

## 2022-05-09 DIAGNOSIS — R7881 Bacteremia: Secondary | ICD-10-CM

## 2022-05-09 DIAGNOSIS — A419 Sepsis, unspecified organism: Secondary | ICD-10-CM | POA: Diagnosis not present

## 2022-05-09 HISTORY — DX: Bacteremia: R78.81

## 2022-05-09 LAB — CBC
HCT: 37.8 % (ref 36.0–46.0)
Hemoglobin: 12.8 g/dL (ref 12.0–15.0)
MCH: 30 pg (ref 26.0–34.0)
MCHC: 33.9 g/dL (ref 30.0–36.0)
MCV: 88.7 fL (ref 80.0–100.0)
Platelets: 90 10*3/uL — ABNORMAL LOW (ref 150–400)
RBC: 4.26 MIL/uL (ref 3.87–5.11)
RDW: 16.2 % — ABNORMAL HIGH (ref 11.5–15.5)
WBC: 29.5 10*3/uL — ABNORMAL HIGH (ref 4.0–10.5)
nRBC: 0.2 % (ref 0.0–0.2)

## 2022-05-09 LAB — GLUCOSE, CAPILLARY
Glucose-Capillary: 118 mg/dL — ABNORMAL HIGH (ref 70–99)
Glucose-Capillary: 128 mg/dL — ABNORMAL HIGH (ref 70–99)
Glucose-Capillary: 129 mg/dL — ABNORMAL HIGH (ref 70–99)
Glucose-Capillary: 131 mg/dL — ABNORMAL HIGH (ref 70–99)
Glucose-Capillary: 134 mg/dL — ABNORMAL HIGH (ref 70–99)
Glucose-Capillary: 135 mg/dL — ABNORMAL HIGH (ref 70–99)
Glucose-Capillary: 140 mg/dL — ABNORMAL HIGH (ref 70–99)
Glucose-Capillary: 141 mg/dL — ABNORMAL HIGH (ref 70–99)
Glucose-Capillary: 143 mg/dL — ABNORMAL HIGH (ref 70–99)
Glucose-Capillary: 143 mg/dL — ABNORMAL HIGH (ref 70–99)
Glucose-Capillary: 146 mg/dL — ABNORMAL HIGH (ref 70–99)
Glucose-Capillary: 147 mg/dL — ABNORMAL HIGH (ref 70–99)
Glucose-Capillary: 150 mg/dL — ABNORMAL HIGH (ref 70–99)
Glucose-Capillary: 151 mg/dL — ABNORMAL HIGH (ref 70–99)
Glucose-Capillary: 178 mg/dL — ABNORMAL HIGH (ref 70–99)
Glucose-Capillary: 199 mg/dL — ABNORMAL HIGH (ref 70–99)
Glucose-Capillary: 201 mg/dL — ABNORMAL HIGH (ref 70–99)
Glucose-Capillary: 221 mg/dL — ABNORMAL HIGH (ref 70–99)
Glucose-Capillary: 246 mg/dL — ABNORMAL HIGH (ref 70–99)

## 2022-05-09 LAB — BLOOD CULTURE ID PANEL (REFLEXED) - BCID2

## 2022-05-09 LAB — URINALYSIS, W/ REFLEX TO CULTURE (INFECTION SUSPECTED)
Bilirubin Urine: NEGATIVE
Glucose, UA: >=500 mg/dL — AB
Ketones, ur: NEGATIVE mg/dL
Nitrite: NEGATIVE
Protein, ur: 30 mg/dL — AB
Specific Gravity, Urine: 1.015 (ref 1.005–1.030)
WBC, UA: >50 WBC/hpf (ref 0–5)
pH: 5.5 (ref 5.0–8.0)

## 2022-05-09 LAB — HIV ANTIBODY (ROUTINE TESTING W REFLEX): HIV Screen 4th Generation wRfx: NONREACTIVE

## 2022-05-09 LAB — BASIC METABOLIC PANEL
Anion gap: 12 (ref 5–15)
BUN: 42 mg/dL — ABNORMAL HIGH (ref 6–20)
CO2: 22 mmol/L (ref 22–32)
Calcium: 8 mg/dL — ABNORMAL LOW (ref 8.9–10.3)
Chloride: 105 mmol/L (ref 98–111)
Creatinine, Ser: 2.53 mg/dL — ABNORMAL HIGH (ref 0.44–1.00)
GFR, Estimated: 22 mL/min — ABNORMAL LOW (ref >60)
Glucose, Bld: 168 mg/dL — ABNORMAL HIGH (ref 70–99)
Potassium: 3.5 mmol/L (ref 3.5–5.1)
Sodium: 139 mmol/L (ref 135–145)

## 2022-05-09 LAB — HEPATIC FUNCTION PANEL
ALT: 30 U/L (ref 0–44)
AST: 51 U/L — ABNORMAL HIGH (ref 15–41)
Albumin: 1.9 g/dL — ABNORMAL LOW (ref 3.5–5.0)
Alkaline Phosphatase: 192 U/L — ABNORMAL HIGH (ref 38–126)
Bilirubin, Direct: 0.3 mg/dL — ABNORMAL HIGH (ref 0.0–0.2)
Indirect Bilirubin: 0.4 mg/dL (ref 0.3–0.9)
Total Bilirubin: 0.7 mg/dL (ref 0.3–1.2)
Total Protein: 5.6 g/dL — ABNORMAL LOW (ref 6.5–8.1)

## 2022-05-09 LAB — PHOSPHORUS: Phosphorus: 2.2 mg/dL — ABNORMAL LOW (ref 2.5–4.6)

## 2022-05-09 LAB — LACTIC ACID, PLASMA
Lactic Acid, Venous: 2.3 mmol/L (ref 0.5–1.9)
Lactic Acid, Venous: 2 mmol/L (ref 0.5–1.9)
Lactic Acid, Venous: 1.8 mmol/L (ref 0.5–1.9)
Lactic Acid, Venous: 1.7 mmol/L (ref 0.5–1.9)

## 2022-05-09 LAB — MAGNESIUM: Magnesium: 1.8 mg/dL (ref 1.7–2.4)

## 2022-05-09 LAB — TROPONIN I (HIGH SENSITIVITY)
Troponin I (High Sensitivity): 133 ng/L (ref <18)
Troponin I (High Sensitivity): 105 ng/L (ref <18)

## 2022-05-09 MED ORDER — INSULIN ASPART 100 UNIT/ML IJ SOLN
0.0000 [IU] | Freq: Every day | INTRAMUSCULAR | Status: DC
  Administered 2022-05-13: 2 [IU] via SUBCUTANEOUS

## 2022-05-09 MED ORDER — INSULIN DETEMIR 100 UNIT/ML ~~LOC~~ SOLN
25.0000 [IU] | Freq: Two times a day (BID) | SUBCUTANEOUS | Status: DC
  Administered 2022-05-09 – 2022-05-14 (×10): 25 [IU] via SUBCUTANEOUS
  Filled 2022-05-09 (×13): qty 0.25

## 2022-05-09 MED ORDER — POTASSIUM PHOSPHATES 15 MMOLE/5ML IV SOLN
30.0000 mmol | Freq: Once | INTRAVENOUS | Status: AC
  Administered 2022-05-09: 30 mmol via INTRAVENOUS
  Filled 2022-05-09: qty 10

## 2022-05-09 MED ORDER — SODIUM CHLORIDE 0.9 % IV SOLN
2.0000 g | INTRAVENOUS | Status: DC
  Administered 2022-05-09 – 2022-05-13 (×5): 2 g via INTRAVENOUS
  Filled 2022-05-09 (×5): qty 20

## 2022-05-09 MED ORDER — GABAPENTIN 100 MG PO CAPS
200.0000 mg | ORAL_CAPSULE | Freq: Two times a day (BID) | ORAL | Status: AC
  Administered 2022-05-09 – 2022-05-12 (×6): 200 mg via ORAL
  Filled 2022-05-09 (×6): qty 2

## 2022-05-09 MED ORDER — CHLORHEXIDINE GLUCONATE CLOTH 2 % EX PADS
6.0000 | MEDICATED_PAD | Freq: Every day | CUTANEOUS | Status: DC
  Administered 2022-05-09 – 2022-05-10 (×2): 6 via TOPICAL

## 2022-05-09 MED ORDER — SODIUM CHLORIDE 0.9% FLUSH: 10.0000 mL | INTRAVENOUS | Status: DC | PRN

## 2022-05-09 MED ORDER — POTASSIUM CHLORIDE 10 MEQ/100ML IV SOLN: 10.0000 meq | INTRAVENOUS | Status: DC

## 2022-05-09 MED ORDER — SODIUM CHLORIDE 0.9% FLUSH
10.0000 mL | Freq: Two times a day (BID) | INTRAVENOUS | Status: DC
  Administered 2022-05-09 – 2022-05-13 (×10): 10 mL

## 2022-05-09 MED ORDER — MAGNESIUM SULFATE 2 GM/50ML IV SOLN
2.0000 g | Freq: Once | INTRAVENOUS | Status: AC
  Administered 2022-05-09: 2 g via INTRAVENOUS
  Filled 2022-05-09: qty 50

## 2022-05-09 MED ORDER — INSULIN ASPART 100 UNIT/ML IJ SOLN
0.0000 [IU] | Freq: Three times a day (TID) | INTRAMUSCULAR | Status: DC
  Administered 2022-05-10: 7 [IU] via SUBCUTANEOUS
  Administered 2022-05-10: 4 [IU] via SUBCUTANEOUS
  Administered 2022-05-10: 7 [IU] via SUBCUTANEOUS
  Administered 2022-05-11 (×2): 3 [IU] via SUBCUTANEOUS
  Administered 2022-05-12 – 2022-05-14 (×5): 4 [IU] via SUBCUTANEOUS

## 2022-05-09 MED ORDER — CYCLOBENZAPRINE HCL 10 MG PO TABS
5.0000 mg | ORAL_TABLET | Freq: Two times a day (BID) | ORAL | Status: AC | PRN
  Administered 2022-05-10: 5 mg via ORAL
  Filled 2022-05-09 (×2): qty 1

## 2022-05-09 MED ORDER — LACTATED RINGERS IV BOLUS
1000.0000 mL | Freq: Once | INTRAVENOUS | Status: AC
  Administered 2022-05-09: 1000 mL via INTRAVENOUS

## 2022-05-09 MED ORDER — CALCIUM GLUCONATE-NACL 2-0.675 GM/100ML-% IV SOLN
2.0000 g | Freq: Once | INTRAVENOUS | Status: AC
  Administered 2022-05-09: 2000 mg via INTRAVENOUS
  Filled 2022-05-09: qty 100

## 2022-05-09 MED ORDER — POTASSIUM CHLORIDE 10 MEQ/50ML IV SOLN
10.0000 meq | INTRAVENOUS | Status: DC
  Administered 2022-05-09 (×2): 10 meq via INTRAVENOUS
  Filled 2022-05-09 (×2): qty 50

## 2022-05-09 NOTE — Progress Notes (Signed)
eLink Physician-Brief Progress Note Patient Name: [name] DOB: [birth year] MRN: [id number]   Date of Service  05/09/2022  HPI/Events of Note  Received a call from pharmacy regarding blood culture growing 3/4 E coli and can linezolid be discontinued 2D echo without evidence of vegetation  eICU Interventions  Ecoli bacteremia likely urine as source May discontinue linezolid and continue aztreonam     Intervention Category Intermediate Interventions: Diagnostic test evaluation  Shona Needles Mieko Kneebone 05/09/2022, 4:08 AM

## 2022-05-09 NOTE — Progress Notes (Signed)
  Transition of Care [facility]) Screening Note   Patient Details  Name: [name] Date of Birth: [birth year]   Transition of Care [facility]) CM/SW Contact:    Cyndi Bender, RN Phone Number: 05/09/2022, 9:59 AM    Transition of Care Department [facility]) has reviewed patient and no TOC needs have been identified at this time. We will continue to monitor patient advancement through interdisciplinary progression rounds. If new patient transition needs arise, please place a TOC consult.

## 2022-05-09 NOTE — Progress Notes (Signed)
NAME:  [name], MRN:  [id number], DOB:  [birth year], LOS: 1 ADMISSION DATE:  05/08/2022, CONSULTATION DATE:  2/19 REFERRING MD:  Dr. Vanita Panda, CHIEF COMPLAINT:  Shock  History of Present Illness:  Patient is a 57 year old female with past medical history as below, which is significant for diabetes, asthma, hypertension, MRSA infection, GERD, thyroid disease, and sleep apnea (long ago).  She presented to [facility] emergency department on 2/19 with complaints of URI symptoms and malaise x 6 days now complicated by nausea and vomiting x 24 hours.  1 day prior to arrival, she was started on doxycycline by PCP.  Upon arrival to the emergency department she was noted to be febrile and hypotensive.  With concern for sepsis she was started immediately on empiric antibiotics and provided with IVF resuscitation.  Lactic acid elevated at 7.  Despite IVF resuscitation, she remained hypotensive and was started on norepinephrine infusion. Echocardiogram in the ED did not demonstrate any significant cardiac dysfunction, but was volume down. PCCM admitted to ICU and continued volume resuscitation.  Did not intubate as patient respiratory status gradually became more stable on nasal cannula.  Pertinent  Medical History   Diagnosis   Anxiety   Arthritis   Asthma   Colon polyp   Hypertension   IBS (irritable bowel syndrome)   Methicillin resistant Staphylococcus aureus in conditions classified elsewhere and of unspecified site   Migraines   Morbid obesity ([place])   Nonspecific abnormal results of thyroid function study   Paresthesias   Pure hypercholesterolemia   Reflux   Sacral fracture (HCC)   Thyroid disease   Type II or unspecified type diabetes mellitus without mention of complication, not stated as uncontrolled   Unspecified sleep apnea   Significant Hospital Events: Including procedures, antibiotic start and stop dates in addition to other pertinent events   2/19 - Admitted in septic shock, fluid  resuscitation, started on aztreonam/linezolid. 2/20 - BCID with E. Coli, d/c linezolid, switch aztreonam to CTX. CT showing ureteral calculus w/ small hydronephrosis, ?hepatic steatosis, bibasilar atelectasis and ?infiltrate.  Interim History / Subjective:  Patient weaned to 4 L [place], and now on 2-3 mcg norepi, 5.5 U insulin gtt.  Is doing much better today, does report back pain 3/10 level and shortness of breath, especially with speaking or moving.  Denies nausea and vomiting.  Does endorse some dysuria and back pain past few days leading up to hospitalization.  Denies chest pain, palpitations.  Says she is feeling much better and recognizes that she had "not been herself" the past few days. Reports history of cholecystectomy for cholestatic disease in 1990s.  Objective   Blood pressure (!) 69/50, pulse 87, temperature 97.9 F (36.6 C), temperature source Oral, resp. rate (!) 26, SpO2 93 %.        Intake/Output Summary (Last 24 hours) at 05/09/2022 [id number] Last data filed at 05/09/2022 0500 Gross per 24 hour  Intake 3813.74 ml  Output 725 ml  Net 3088.74 ml   There were no vitals filed for this visit. Examination: General: Female with large body habitus, in no acute distress, and  HEENT: Dry lips, NCAT. No scleral icterus. Lungs: Some crackles breath sounds in lower lung fields, auscultation limited by body habitus. Mildly increased WOB on 4 L [place]. Becomes increasingly short of breath during conversation. Cardiovascular: Normal S1/S2, regular rate and rhythm. Auscultation limited by body habitus. Systolic pulse variation on arterial line monitoring. Abdomen: Soft, nondistended. No TTP. Normoactive bowel sounds. GU: Foley in  place and draining yellow urine. Not able to assess CVA tenderness. Extremities: Capillary refill ~3 seconds. 1+ pitting edema bilaterally. Skin: Warm, dry. No rashes or jaundice grossly. Neuro: Able to converse appropriately, follows instructions, A&Ox4.  [facility]  BCID at 24 hours: E. coli in 3/4 bottles w/ no resistance.  UA 2/20: Small leukocytes and proteinuria with Hgb and glucosuria, no ketones. Urine Cx 2/20: Pending.  CT chest/abdomen/pelvis 2/19:  1. Bibasilar dependent atelectasis. Right upper lobe ground-glass streaky densities may represent atelectasis or infiltrate. 2. A punctate right UVJ stone with mild right hydronephrosis. 3. Punctate nonobstructing left renal upper pole calculus. No hydronephrosis on the left. 4. Fatty liver. Indeterminate low attenuating area involving the left lobe of the liver may represent more advanced CT steatosis.  TTE 2/19: LVEF 50-55%. Grade I diastolic dysfunction.  Regional wall motion abnormalities w/ inferoseptal and inferior hypokinesis. Technically difficult study, poor images.  Mg 1.8 P 2.2 K 3.5  Cr 2.83 > 2.53 BUN 41 > 42  Alk phosph 901 > 192 T. Bili 1.7 > 0.7 AST 39 > 51 ALT 34 > 30  WBC 4.8 > 29.5 Hgb 15.1 > 12.8 Plt 108 > 90  HS troponin 133  Assessment & Plan:  Septic shock ?Pyelonephritis vs ?biliary etiology Lactic acidosis Encephalopathy resolved, still somewhat hypovolemic.  Discontinued linezolid and switched to CTX given BCID E. coli.  T max 2/19 102.1 F, afebrile since.  New leukocytosis, though patient did start receiving hydrocortisone yesterday.  Possible etiology of sepsis is pyelonephritis given ureteral calculus and hydronephrosis as well as UA, though also considering hepatobiliary etiology given elevate alk phosph and bilirubin on admission.  Does have history of cholecystectomy. - Switch aztreonam to CTX, pharmacy d/c linezolid - f/u repeat lactate - Monitor CMP, outpatient follow up for hepatic steatosis - Norepi with MAP goal > 65 mmHg - Hydrocortisone in setting of sepsis and pressors - 1 L LR bolus x1  Obstructive R UVJ stone and hydronephrosis Concern for worsening of hydronephrosis, renal US did not show hydronephrosis today, which  is reassuring. - Urology consult if further concern stone still obstructing  AKI Suspect prerenal etiology given hypovolemia and sepsis on admission, but also obstructive etiology given that BUN:Cr ratio ~16 does not favor prerenal etiology solely. - Optimize electrolytes, goal K>4, Mg>2, P>3.5; repleting as necessary - Trend BMP  Acute hypoxic respiratory failure with hypercarbia Continues to be stable on nasal cannula, gradually weaning.  Atelectasis and recent illness likely limiting respiratory reserve.  Does not use BiPAP/CPAP at home, but will require sleep study outpatient, does have concern for sleep apnea (has diagnosis from long ago). - SpO2 goal > 92%, wean as able - Encourage incentive spirometry hourly - Repeat outpatient sleep study  Diastolic dysfunction on TTE Does have mildly elevated troponin and some hypokinesis on echo yesterday, will CTM. - Trend troponin   T2DM with hyperglycemia Hgb A1c 7.8 this admission, had been on Jardiance and Trulicity at home as well as Novolog sliding scale (20-26 units) TID with meals.  CBGs downtrending, normalized following fluids and on Endotool/insulin gtt.  No ketones on UA, but BHB mildly elevated. - Hold home Jardiance and Trulicity in setting of acute illness - Continue insulin gtt for now per Endotool protocol  HTN Holding home metoprolol in setting of sepsis and hypotension.  Best Practice (right click and "Reselect all SmartList Selections" daily)   Diet/type: NPO DVT prophylaxis: prophylactic heparin  GI prophylaxis: H2B Lines: Central  line, Arterial Line, and yes and it is still needed Foley:  Yes, and it is no longer needed Code Status:  full code Last date of multidisciplinary goals of care discussion: Updating husband and Pt at bedside daily   [place] Katy Fitch, MS4 05/09/2022, 7:28 AM Johnell Comings, PCCM

## 2022-05-09 NOTE — Progress Notes (Signed)
eLink Physician-Brief Progress Note Patient Name: [name] DOB: [birth year] MRN: [id number]   Date of Service  05/09/2022  HPI/Events of Note  potassium 3.5,  creat 2.53, GFR 22 improving, mag 1.8. pt has central line,   eICU Interventions  Potassium 10 meqs x 2 doses ordered     Intervention Category Intermediate Interventions: Electrolyte abnormality - evaluation and management  Judd Lien 05/09/2022, 4:36 AM

## 2022-05-09 NOTE — Progress Notes (Signed)
PHARMACY - PHYSICIAN COMMUNICATION CRITICAL VALUE ALERT - BLOOD CULTURE IDENTIFICATION (BCID)  [name] is an 57 y.o. female who presented to [facility] on 05/08/2022 admitted for sepsis. Patient has been started on aztreonam. sCr elevated at 2.83, WBC WNL, Tmax 102.40F on 2/19 AM and afebrile since. In 3/4 bottles E. Coli with no resistance detected growing in blood. Source is still unclear with possibility of pyelonephritis.   Name of physician (or Provider) Contacted: Dr. Genevive Bi  Current antibiotics: aztreonam 1g IV every 8 hours and linezolid 618m IV q12h  Changes to prescribed antibiotics recommended:   Continue aztreonam 1g IV every 8 hours D/c linezolid 6097mIV every 12 hours  Continue to monitor culture data, fever curve, WBC, and signs of clinical improvement  Results for orders placed or performed during the hospital encounter of 05/08/22  Blood Culture ID Panel (Reflexed) (Collected: 05/08/2022 10:15 AM)  Result Value Ref Range   Enterococcus faecalis NOT DETECTED NOT DETECTED   Enterococcus Faecium NOT DETECTED NOT DETECTED   Listeria monocytogenes NOT DETECTED NOT DETECTED   Staphylococcus species NOT DETECTED NOT DETECTED   Staphylococcus aureus (BCID) NOT DETECTED NOT DETECTED   Staphylococcus epidermidis NOT DETECTED NOT DETECTED   Staphylococcus lugdunensis NOT DETECTED NOT DETECTED   Streptococcus species NOT DETECTED NOT DETECTED   Streptococcus agalactiae NOT DETECTED NOT DETECTED   Streptococcus pneumoniae NOT DETECTED NOT DETECTED   Streptococcus pyogenes NOT DETECTED NOT DETECTED   A.calcoaceticus-baumannii NOT DETECTED NOT DETECTED   Bacteroides fragilis NOT DETECTED NOT DETECTED   Enterobacterales DETECTED (A) NOT DETECTED   Enterobacter cloacae complex NOT DETECTED NOT DETECTED   Escherichia coli DETECTED (A) NOT DETECTED   Klebsiella aerogenes NOT DETECTED NOT DETECTED   Klebsiella oxytoca NOT DETECTED NOT DETECTED   Klebsiella pneumoniae NOT  DETECTED NOT DETECTED   Proteus species NOT DETECTED NOT DETECTED   Salmonella species NOT DETECTED NOT DETECTED   Serratia marcescens NOT DETECTED NOT DETECTED   Haemophilus influenzae NOT DETECTED NOT DETECTED   Neisseria meningitidis NOT DETECTED NOT DETECTED   Pseudomonas aeruginosa NOT DETECTED NOT DETECTED   Stenotrophomonas maltophilia NOT DETECTED NOT DETECTED   Candida albicans NOT DETECTED NOT DETECTED   Candida auris NOT DETECTED NOT DETECTED   Candida glabrata NOT DETECTED NOT DETECTED   Candida krusei NOT DETECTED NOT DETECTED   Candida parapsilosis NOT DETECTED NOT DETECTED   Candida tropicalis NOT DETECTED NOT DETECTED   Cryptococcus neoformans/gattii NOT DETECTED NOT DETECTED   CTX-M ESBL NOT DETECTED NOT DETECTED   Carbapenem resistance IMP NOT DETECTED NOT DETECTED   Carbapenem resistance KPC NOT DETECTED NOT DETECTED   Carbapenem resistance NDM NOT DETECTED NOT DETECTED   Carbapenem resist OXA 48 LIKE NOT DETECTED NOT DETECTED   Carbapenem resistance VIM NOT DETECTED NOT DETECTED    JaSandford CrazePharmD. Moses CoHazleton [facility] LLCcute Care PGY-1  05/09/2022 3:37 AM

## 2022-05-09 NOTE — Progress Notes (Signed)
eLink Physician-Brief Progress Note Patient Name: [name] DOB: June 24, 1965 MRN: [id number]   Date of Service  05/09/2022  HPI/Events of Note  Patient asking for resumption of her home Flexeril and Gabapentin medications.  eICU Interventions  Medications re-ordered at reduced dosages due to her renal failure and the fact that her presentation to the hospital included altered mental status.        Frederik Pear 05/09/2022, 11:37 PM

## 2022-05-10 DIAGNOSIS — R7989 Other specified abnormal findings of blood chemistry: Secondary | ICD-10-CM | POA: Diagnosis not present

## 2022-05-10 DIAGNOSIS — A419 Sepsis, unspecified organism: Secondary | ICD-10-CM | POA: Diagnosis not present

## 2022-05-10 DIAGNOSIS — R6521 Severe sepsis with septic shock: Secondary | ICD-10-CM | POA: Diagnosis not present

## 2022-05-10 LAB — COMPREHENSIVE METABOLIC PANEL
ALT: 29 U/L (ref 0–44)
AST: 45 U/L — ABNORMAL HIGH (ref 15–41)
Albumin: 1.8 g/dL — ABNORMAL LOW (ref 3.5–5.0)
Alkaline Phosphatase: 142 U/L — ABNORMAL HIGH (ref 38–126)
Anion gap: 12 (ref 5–15)
BUN: 57 mg/dL — ABNORMAL HIGH (ref 6–20)
CO2: 23 mmol/L (ref 22–32)
Calcium: 7.9 mg/dL — ABNORMAL LOW (ref 8.9–10.3)
Chloride: 102 mmol/L (ref 98–111)
Creatinine, Ser: 2.56 mg/dL — ABNORMAL HIGH (ref 0.44–1.00)
GFR, Estimated: 21 mL/min — ABNORMAL LOW (ref >60)
Glucose, Bld: 208 mg/dL — ABNORMAL HIGH (ref 70–99)
Potassium: 3.9 mmol/L (ref 3.5–5.1)
Sodium: 137 mmol/L (ref 135–145)
Total Bilirubin: 0.5 mg/dL (ref 0.3–1.2)
Total Protein: 5.2 g/dL — ABNORMAL LOW (ref 6.5–8.1)

## 2022-05-10 LAB — CBC WITH DIFFERENTIAL/PLATELET
Abs Immature Granulocytes: 0.84 10*3/uL — ABNORMAL HIGH (ref 0.00–0.07)
Basophils Absolute: 0.1 10*3/uL (ref 0.0–0.1)
Basophils Relative: 1 %
Eosinophils Absolute: 0.1 10*3/uL (ref 0.0–0.5)
Eosinophils Relative: 1 %
HCT: 36 % (ref 36.0–46.0)
Hemoglobin: 12.1 g/dL (ref 12.0–15.0)
Immature Granulocytes: 4 %
Lymphocytes Relative: 12 %
Lymphs Abs: 2.3 10*3/uL (ref 0.7–4.0)
MCH: 30.1 pg (ref 26.0–34.0)
MCHC: 33.6 g/dL (ref 30.0–36.0)
MCV: 89.6 fL (ref 80.0–100.0)
Monocytes Absolute: 0.9 10*3/uL (ref 0.1–1.0)
Monocytes Relative: 4 %
Neutro Abs: 15.6 10*3/uL — ABNORMAL HIGH (ref 1.7–7.7)
Neutrophils Relative %: 78 %
Platelets: 76 10*3/uL — ABNORMAL LOW (ref 150–400)
RBC: 4.02 MIL/uL (ref 3.87–5.11)
RDW: 16.4 % — ABNORMAL HIGH (ref 11.5–15.5)
WBC: 19.8 10*3/uL — ABNORMAL HIGH (ref 4.0–10.5)
nRBC: 0 % (ref 0.0–0.2)

## 2022-05-10 LAB — GLUCOSE, CAPILLARY
Glucose-Capillary: 168 mg/dL — ABNORMAL HIGH (ref 70–99)
Glucose-Capillary: 222 mg/dL — ABNORMAL HIGH (ref 70–99)
Glucose-Capillary: 210 mg/dL — ABNORMAL HIGH (ref 70–99)
Glucose-Capillary: 155 mg/dL — ABNORMAL HIGH (ref 70–99)

## 2022-05-10 LAB — LACTIC ACID, PLASMA: Lactic Acid, Venous: 1.5 mmol/L (ref 0.5–1.9)

## 2022-05-10 LAB — URINE CULTURE: Culture: <10000 — AB

## 2022-05-10 LAB — PHOSPHORUS: Phosphorus: 4.3 mg/dL (ref 2.5–4.6)

## 2022-05-10 LAB — CULTURE, BLOOD (ROUTINE X 2): Special Requests: ADEQUATE

## 2022-05-10 LAB — MAGNESIUM: Magnesium: 2.4 mg/dL (ref 1.7–2.4)

## 2022-05-10 MED ORDER — ONDANSETRON HCL 4 MG/2ML IJ SOLN
4.0000 mg | Freq: Two times a day (BID) | INTRAMUSCULAR | Status: DC | PRN
  Administered 2022-05-10 – 2022-05-12 (×3): 4 mg via INTRAVENOUS
  Filled 2022-05-10 (×3): qty 2

## 2022-05-10 MED ORDER — FUROSEMIDE 10 MG/ML IJ SOLN
80.0000 mg | Freq: Once | INTRAMUSCULAR | Status: AC
  Administered 2022-05-10: 80 mg via INTRAVENOUS
  Filled 2022-05-10: qty 8

## 2022-05-10 NOTE — Progress Notes (Signed)
NAME:  ASHARIE SRIVASTAVA, MRN:  [id number], DOB:  [birth year], LOS: 2 ADMISSION DATE:  05/08/2022, CONSULTATION DATE:  05/08/2022 REFERRING MD:  Dr. Vanita Panda, CHIEF COMPLAINT:  Shock  History of Present Illness:  Patient is a 57 year old female with past medical history as below, which is significant for diabetes, asthma, hypertension, MRSA infection, GERD, thyroid disease, and sleep apnea (long ago).  She presented to [facility] emergency department on 2/19 with complaints of URI symptoms and malaise x 6 days now complicated by nausea and vomiting x 24 hours.  1 day prior to arrival, she was started on doxycycline by PCP.  Upon arrival to the emergency department she was noted to be febrile and hypotensive.  With concern for sepsis she was started immediately on empiric antibiotics and provided with IVF resuscitation.  Lactic acid elevated at 7.  Despite IVF resuscitation, she remained hypotensive and was started on norepinephrine infusion. Echocardiogram in the ED did show some localized hypokinesis and patient was volume down. PCCM admitted to ICU and continued volume resuscitation.  Did not intubate as patient respiratory status gradually became more stable on nasal cannula.  Pertinent  Medical History   Diagnosis   Anxiety   Arthritis   Asthma   Colon polyp   Hypertension   IBS (irritable bowel syndrome)   Methicillin resistant Staphylococcus aureus in conditions classified elsewhere and of unspecified site   Migraines   Morbid obesity ([place])   Nonspecific abnormal results of thyroid function study   Paresthesias   Pure hypercholesterolemia   Reflux   Sacral fracture (HCC)   Thyroid disease   Type II or unspecified type diabetes mellitus without mention of complication, not stated as uncontrolled   Unspecified sleep apnea    Significant Hospital Events: Including procedures, antibiotic start and stop dates in addition to other pertinent events   2/19 - Admitted in septic shock, fluid  resuscitation, pressors, started on aztreonam/linezolid. 2/20 - BCID with E. Coli, d/c linezolid, switch aztreonam to CTX. CT showing ureteral calculus w/ small hydronephrosis, f/u renal US with no stone or hydronephrosis. 2/21 - Off pressors. AKI stable. Nonsustained run of Vtach, consult to cardiology. Lines removed. Dispo to cardiac-tele floor.  Interim History / Subjective:  Borderline hypotension overnight, did require pressors briefly but now not needing them again.  Foley removal this morning.  Feeling well, did have an episode of palpitations, lightheadedness while eating this morning associated with 10-12 beat run of Vtach.  Otherwise feeling better, moderately dyspneic with exertion.  Weaning O2, down to 3 L [place].  Does complain of cough.  Objective   Blood pressure (!) 69/50, pulse 76, temperature 97.9 F (36.6 C), temperature source Oral, resp. rate 18, height 5' 6.5" (1.689 m), weight (!) 155.4 kg, SpO2 99 %.        Intake/Output Summary (Last 24 hours) at 05/10/2022 0714 Last data filed at 05/10/2022 0200 Gross per 24 hour  Intake 2616.53 ml  Output 130 ml  Net 2486.53 ml   Filed Weights   05/10/22 0600  Weight: (!) 155.4 kg   Examination: General: Female with large body habitus. HEENT: PERRLA. MMM. NCAT. Lungs: Coarse breath sounds at lung bases bilaterally. Normal WOB on 3 L [place]. Cardiovascular: Normal S1/S2. Normal rate/rhythm. Abdomen: No TTP. Normoactive bowel sounds. GU: Foley in place, draining clear urine. Extremities: 1+ pitting edema bilaterally. Well perfused, warm. Capillary refill <5 seconds. Skin: Warm, dry. No rashes grossly. Neuro: A&Ox4, normal sensation and strength bilaterally.  [place]  [facility]  Urine Cx 2/20: Pending.  Blood Cx 2/19: Sensitivities pending.  Mg 2.4 P 4.3 K 3.9  Trends Cr 2.83 > 2.53 > 2.56 BUN 41 > 42 > 57   Alk phosph 901 > 192 > 142 T. Bili 1.7 > 0.7 > 0.5 AST 39 > 51 > 45 ALT 34 > 30 > 29   WBC 4.8 >  29.5 > 19.8 Hgb 15.1 > 12.8 > 12.1 Plt 108 > 90 > 76  Assessment & Plan:  Septic shock E. coli bacteremia Lactic acidosis, resolved Continue CTX 10 days for E. coli bacteremia.  Leukocytosis improving, lactic acidosis resolved.  Improving and clinically stable to move out of ICU. - Continue CTX (day 2 of 10) - Discontinue stress dose hydrocortisone   AKI, unchanged Obstructive R UVJ stone and hydronephrosis Creatinine flat and persistently elevated.  Likely mixed prerenal, intrarenal, and/or postrenal etiology.  No concern for obstructive hydronephrosis from nephrolithiasis given clear renal US yesterday.  Will CTM. - Optimize electrolytes, goal K>4, Mg>2, P>3.5; repleting as necessary - Trend BMP - Lasix 80 mg IV once, then reevaluate   Acute hypoxic respiratory failure with hypercarbia Continues to be stable on nasal cannula, gradually weaning.  Atelectasis and recent illness likely limiting respiratory reserve. - SpO2 goal > 92%, wean as able - Encourage incentive spirometry hourly - Repeat outpatient sleep study, possible BiPAP need  Hypokinesis of septum on TTE Nonsustained run of Vtach EKG reassuring this morning, but concern given that patient had symptomatic Vtach in setting of TTE findings and persistently elevated troponin.  Concern for missed MI or other cardiac etiology. - Consulting cardiology, will see patient - Trend troponin   T2DM with hyperglycemia Hgb A1c 7.8 this admission, had been on Jardiance and Trulicity at home as well as Novolog sliding scale (20-26 units) TID with meals.  CBGs 100-200, got 4 [place] short acting this morning.  No change to insulin regimen needed, goal 140-180. - Hold home Jardiance and Trulicity in setting of acute illness - 25 U levemir BID and SSI   HTN Holding home metoprolol in setting of sepsis and hypotension.  Best Practice (right click and "Reselect all SmartList Selections" daily)   Diet/type: Regular consistency (see  orders) DVT prophylaxis: prophylactic heparin  GI prophylaxis: N/A Lines: Central line, Arterial Line, and No longer needed.  Order written to d/c  Foley:  Yes, and it is no longer needed and removal ordered  Code Status:  full code Last date of multidisciplinary goals of care discussion: Daily updates at bedside  Dispo: [place] today  Aryn Kops, MS4 05/10/2022, 7:14 AM Johnell Comings, PCCM

## 2022-05-10 NOTE — Progress Notes (Signed)
Pt transferred to [place]. V/S/S, pt voice no complaints at this time. Pt's daughter at bedside.

## 2022-05-10 NOTE — Consult Note (Signed)
Cardiology Consultation   Patient ID: [name] MRN: [id number]; DOB: 01/21/1966  Admit date: 05/08/2022 Date of Consult: 05/10/2022  PCP:  Pleas Koch, NP   [place] Providers Cardiologist:  [place] (Dr. Martinique)  Patient Profile:   [name] is a 57 y.o. female with a history of hypertension, hyperlipidemia, type 2 diabetes mellitus, thyroid disease, IBS, and sleep apnea who is being seen 05/10/2022 for the evaluation of elevated troponin and NSVT at the request of Dr. Tacy Learn.  History of Present Illness:   [name] is a 57 year old female with the above history no prior cardiac history.  She remotes having a prior stress test several years ago and sounds like this was normal. She presented to the [facility] ED on 05/08/2022 with URI symptoms and general malaise 6 days and then nausea/vomiting and diarrhea for 24 hours. Upon arrival to the ED, she was noted to be febrile with temp of 102.1 and hypotensive with BP as low as 69/29. Code Sepsis was called and she was started on empiric antibiotics and IV fluids.  Initial EKG showed sinus tachycardia, rate 129 bpm, with no acute ST/T changes.  WBC was normal but lactic acid was 7.6. Chest x-ray showed pulmonary interstitial prominence with vascular congestion with no focal consolidation consistent with CHF. High sensitivity troponin 133 >> 105.  Na 136, K 3.7, Glucose 269, BUN 41, Cr 2.83 (baseline around 0.9). Albumin 2.6, AST 39, ALT 34, Alk Phos 901, Total Bili 1.7 Respiratory panel was negative for COVID, influenza A/B, and RSV. Blood cultures positive for E.Coli. Abdominal/ pelvic CT showed punctate right IVJ stone with mild right hydronephrosis, punctate non-obstructing left renal upper pole calculus with no hydronephrosis on the left, and bibasilar dependent atelectasis with right upper lobe ground glass streaky densities which may represent atelectasis or infiltrate. She was admitted with acute pyelonephritis with  septic shock. She ultimately required Levophed which has now been weaned off.  Renal ultrasound on 2/20 showed no evidence of hydronephrosis. Echo showed LVEF of 50-55% with possible inferoseptal and inferior hypokinesis and grade 1 diastolic dysfunction.  Cardiology was consulted today for further evaluation of NSVT and elevated troponin. Patient reports URI symptoms (nasal congestion and cough) for about 1 week prior to admission. She normally gets pneumonia once a year and felt like she was developing pneumonia. She had significant weakness with this and then developed nausea/vomiting. She reports some mild shortness of breath with all this and states it "just felt like I was getting pneumonia." She had some mild chest tightness on 05/08/2022 prior to EMS being called and then again yesterday but she describes this more as just feeling like she can't take a deep breath. No other chest discomfort. She does report having to sleep on more of an incline and waking up coughing leading up to current admission. Edema is well controlled with compression boots. She denies any palpitations, lightheadedness, dizziness, or syncope. No abnormal bleeding in urine or stools.   She states she was previously diagnosed with sleep apnea and was on CPAP. She was then changed to BiPAP when Neurology thought she may have ACL. This has since been ruled out. She now needs an updated sleep study so she can get another CPAP machine.  Past Medical History:  Diagnosis Date   Allergy    Amyotrophic lateral sclerosis (ALS) ([place]) 09/23/2018   Anemia    Anxiety    Arthritis    Asthma    Colon polyp  Complication of anesthesia    Diabetes mellitus    Displaced fracture of proximal end of right fibula 07/21/2017   Edema    Fatigue    Hypertension    IBS (irritable bowel syndrome)    Lump in female breast    Methicillin resistant Staphylococcus aureus in conditions classified elsewhere and of unspecified site    Migraines     Morbid obesity ([place])    Night sweats    Nonspecific abnormal results of thyroid function study    Other B-complex deficiencies    Paresthesias    PONV (postoperative nausea and vomiting)    Pure hypercholesterolemia    Reflux    Sacral fracture (HCC)    Sinus problem    Symptomatic states associated with artificial menopause    Thyroid disease    Type II or unspecified type diabetes mellitus without mention of complication, not stated as uncontrolled    Unspecified sleep apnea     Past Surgical History:  Procedure Laterality Date   abcess removal  1997   MRSA   ABDOMINAL HYSTERECTOMY  1997   complete in 1997, partial was in 1995   carpal tunnel release r  07/03/13   [place]  03/1989   COLONOSCOPY  03/21/2011   Normal.  [facility].   KNEE SURGERY  2006   LAPAROSCOPIC GASTRIC BANDING  05/2009   TONSILLECTOMY AND ADENOIDECTOMY  1974   TUBAL LIGATION     ULNAR TUNNEL RELEASE Right 02/05/2018   Procedure: CUBITAL TUNNEL RELEASE/DECOMPRESSION;  Surgeon: Melrose Nakayama, MD;  Location: [facility];  Service: Orthopedics;  Laterality: Right;     Home Medications:  Prior to Admission medications   Medication Sig Start Date End Date Taking? Authorizing Provider  albuterol (PROVENTIL) (2.5 MG/3ML) 0.083% nebulizer solution Take 3 mLs (2.5 mg total) by nebulization every 6 (six) hours as needed for wheezing or shortness of breath. 01/12/22  Yes Tonia Ghent, MD  albuterol (VENTOLIN HFA) 108 (90 Base) MCG/ACT inhaler Inhale 1-2 puffs into the lungs every 6 (six) hours as needed for wheezing or shortness of breath. 01/12/22  Yes Tonia Ghent, MD  atorvastatin (LIPITOR) 40 MG tablet Take 1 tablet (40 mg total) by mouth daily. for cholesterol. 08/12/21  Yes Pleas Koch, NP  benzonatate (TESSALON) 100 MG capsule Take 100 mg by mouth daily as needed for cough.   Yes [provider]  cyclobenzaprine (FLEXERIL) 10 MG tablet  TAKE 1 TABLET BY MOUTH 3 TIMES DAILY AS NEEDED FOR MUSCLE SPASMS Patient taking differently: Take 10 mg by mouth daily as needed for muscle spasms. 01/25/22  Yes Pleas Koch, NP  diphenhydramine-acetaminophen (TYLENOL PM) 25-500 MG TABS tablet Take 1 tablet by mouth at bedtime.   Yes [provider]  doxycycline (VIBRA-TABS) 100 MG tablet Take 1 tablet (100 mg total) by mouth 2 (two) times daily. 05/06/22  Yes Tempie Hoist, FNP  Dulaglutide 4.5 MG/0.5ML SOPN Inject into the skin. 05/18/20  Yes [provider]  empagliflozin (JARDIANCE) 25 MG TABS tablet Take 1 tablet (25 mg total) by mouth daily before breakfast. For diabetes. 05/12/20  Yes Pleas Koch, NP  ergocalciferol (VITAMIN D2) 1.25 MG (50000 UT) capsule Take by mouth. 11/14/18  Yes [provider]  fexofenadine (ALLEGRA) 180 MG tablet 1 tablet   Yes [provider]  FLUoxetine (PROZAC) 40 MG capsule TAKE 1 CAPSULE BY MOUTH ONCE DAILY FOR ANXIETY AND  DEPRESSION 06/19/21  Yes Pleas Koch, NP  fluticasone (FLONASE) 50 MCG/ACT nasal spray Place 1 spray into both nostrils 2 (two) times daily. 05/25/20  Yes Pleas Koch, NP  gabapentin (NEURONTIN) 600 MG tablet Take 1 tablet (600 mg total) by mouth in the morning and at bedtime. For neuropathy 06/14/21  Yes Pleas Koch, NP  hydrochlorothiazide (HYDRODIURIL) 12.5 MG tablet Take 1 tablet (12.5 mg total) by mouth daily. for blood pressure. 06/19/21  Yes Pleas Koch, NP  insulin aspart (NOVOLOG FLEXPEN) 100 UNIT/ML FlexPen Inject 22-26 Units into the skin 3 (three) times daily with meals. 12/01/19  Yes Pleas Koch, NP  insulin degludec (TRESIBA) 100 UNIT/ML FlexTouch Pen Inject 40 Units into the skin 2 (two) times daily. 01/12/22  Yes Tonia Ghent, MD  levocetirizine (XYZAL) 5 MG tablet TAKE ONE TABLET BY MOUTH EVERY EVENING 07/16/17  Yes Pleas Koch, NP  losartan (COZAAR) 50 MG tablet TAKE 1 TABLET BY MOUTH ONCE DAILY FOR  BLOOD PRESSURE 08/25/21  Yes Pleas Koch, NP  metoprolol tartrate (LOPRESSOR) 25 MG tablet TAKE 0.5 TABLET BY MOUTH TWICE DAILY FORBLOOD PRESSURE Patient taking differently: Take 12.5 mg by mouth 2 (two) times daily. 12/19/21  Yes Pleas Koch, NP  Multiple Vitamin (MULTIVITAMIN PO) Take 1 tablet by mouth daily.    Yes [provider]  ondansetron (ZOFRAN-ODT) 4 MG disintegrating tablet TAKE 1 TABLET BY MOUTH EVERY 8 HOURS AS NEEDED FOR NAUSEA AS DIRECTED. 04/16/22  Yes Pleas Koch, NP  VALERIAN ROOT PO Take 1,000 mg by mouth at bedtime.   Yes [provider]  chlorpheniramine-HYDROcodone (TUSSIONEX) 10-8 MG/5ML Take 5 mLs by mouth every 12 (twelve) hours as needed for cough. Patient not taking: Reported on 05/08/2022 01/19/22   Pleas Koch, NP  Continuous Blood Gluc Receiver ([place]) [place] by Does not apply route.    [provider]  Continuous Blood Gluc Sensor ([place]) MISC by Does not apply route.    [provider]  Glucagon 3 MG/DOSE POWD Place into the nose. Patient not taking: Reported on 05/08/2022 02/18/20   [provider]  Insulin Pen Needle 32G X 4 MM MISC Use 1 needle with pen as directed 09/20/19   Pleas Koch, NP  Nebulizer MISC Dispense 1 nebulizer for home use, with tubing and supplies. [id number]. 01/12/22   Tonia Ghent, MD    Inpatient Medications: Scheduled Meds:  Chlorhexidine Gluconate Cloth  6 each Topical Daily   gabapentin  200 mg Oral BID   heparin  5,000 Units Subcutaneous Q8H   insulin aspart  0-20 Units Subcutaneous TID WC   insulin aspart  0-5 Units Subcutaneous QHS   insulin detemir  25 Units Subcutaneous BID   sodium chloride flush  10-40 mL Intracatheter Q12H   Continuous Infusions:  cefTRIAXone (ROCEPHIN)  IV Stopped (05/10/22 1258)   PRN Meds: cyclobenzaprine, docusate sodium, polyethylene glycol, sodium chloride flush  Allergies:    Allergies  Allergen  Reactions   Amoxicillin Shortness Of Breath and Rash    Patient states she was previously able to tolerate Keflex without issue    Bee Venom Anaphylaxis   Levofloxacin Shortness Of Breath and Rash   Meloxicam Hives and Rash   Vancomycin Anaphylaxis   Sulfa Antibiotics Rash   Ace Inhibitors Cough   Codeine    Meperidine Hcl    Oxycodone-Acetaminophen    Tyloxapol     Other reaction(s): vomiting/rash  Penicillins Swelling and Rash    Has patient had a PCN reaction causing immediate rash, facial/tongue/throat swelling, SOB or lightheadedness with hypotension: Yes Has patient had a PCN reaction causing severe rash involving mucus membranes or skin necrosis: Yes Has patient had a PCN reaction that required hospitalization: No Has patient had a PCN reaction occurring within the last 10 years: No If all of the above answers are "NO", then may proceed with Cephalosporin use.    Tequin [Gatifloxacin] Rash    Social History:   Social History   Socioeconomic History   Marital status: Married    Spouse name: Not on file   Number of children: 3   Years of education: college   Highest education level: Not on file  Occupational History   Occupation: Data processing manager: [employer]  Tobacco Use   Smoking status: Never   Smokeless tobacco: Never  Vaping Use   Vaping Use: Never used  Substance and Sexual Activity   Alcohol use: Not Currently    Comment: rarely - once per week at most-nothing for the past year 01/31/18   Drug use: No   Sexual activity: Never    Birth control/protection: None  Other Topics Concern   Not on file  Social History Narrative   Exercise: walking two days per week, 30 minutes.       Married x 29 years, happily married; no abuse.       Children: 3 children; 1 grandchild; two step grandchildren; 1 gg.      Lives: with husband, mother-in-law.      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      Tobacco: never      Alcohol: socially.   One glass of wine per month.      Exercise:  Walking around campus.        Seatbelt: 100%      Guns: gun safe in garage.   Social Determinants of Health   Financial Resource Strain: Low Risk  (09/30/2021)   Overall Financial Resource Strain (CARDIA)    Difficulty of Paying Living Expenses: Not hard at all  Food Insecurity: No Food Insecurity (09/30/2021)   Hunger Vital Sign    Worried About Running Out of Food in the Last Year: Never true    Ran Out of Food in the Last Year: Never true  Transportation Needs: No Transportation Needs (09/30/2021)   PRAPARE - Hydrologist (Medical): No    Lack of Transportation (Non-Medical): No  Physical Activity: Inactive (09/30/2021)   Exercise Vital Sign    Days of Exercise per Week: 0 days    Minutes of Exercise per Session: 0 min  Stress: No Stress Concern Present (09/30/2021)   [place]    Feeling of Stress : Not at all  Social Connections: Not on file  Intimate Partner Violence: Not on file    Family History:   Family History  Problem Relation Age of Onset   Diabetes Mother    Heart disease Mother 66       CHF, CAD   Other Mother        muscle disease   Hypertension Mother    Hyperlipidemia Mother    Arthritis Mother        OA hip L s/p THR   Leukemia Mother 36   Diabetes Father    Heart disease Father  AMI/CABG/valve replacement age 33.   Stroke Father    Diabetes Brother    Heart disease Brother        stent age 61.   Fibromyalgia Daughter    Crohn's disease Daughter    Hypertension Son    Alzheimer's disease Maternal Grandmother    Emphysema Maternal Grandfather    Heart disease Paternal Grandmother    Breast cancer Other        2 Aunts     ROS:  Please see the history of present illness.  All other ROS reviewed and negative.     Physical Exam/Data:   Vitals:   05/10/22 1230 05/10/22 1245 05/10/22 1300 05/10/22  1618  BP:    130/82  Pulse: 75   78  Resp: 20 (!) 21 (!) 21 18  Temp:      TempSrc:      SpO2: 93%   97%  Weight:      Height:        Intake/Output Summary (Last 24 hours) at 05/10/2022 1628 Last data filed at 05/10/2022 1300 Gross per 24 hour  Intake 680.99 ml  Output 1310 ml  Net -629.01 ml      05/10/2022    6:00 AM 01/12/2022    9:07 AM 09/30/2021    2:56 PM  Last 3 Weights  Weight (lbs) 342 lb 9.5 oz 310 lb 301 lb  Weight (kg) 155.4 kg 140.615 kg 136.533 kg     Body mass index is 54.47 kg/m.   Exam per MD:   General: 57 y.o. morbidly obese Caucasian female resting comfortably in no acute distress. HEENT: Normocephalic and atraumatic. Sclera clear.  Heart: RRR.  No murmurs, gallops, or rubs.  Lungs: No increased work of breathing. Clear to ausculation bilaterally. No wheezes, rhonchi, or rales.  Abdomen: Soft, non-distended, and non-tender to palpation.  Extremities: No lower extremity edema.    Skin: Warm and dry. Neuro: Alert and oriented x3. No focal deficits. Psych: Normal affect. Responds appropriately.   EKG:  The EKG was personally reviewed and demonstrates:  Initial EKG on 05/08/2022 showed sinus tachycardia, rate 129 bpm with no acute ST/T changes. Repeat EKG on 05/10/2022 showed normal sinus rhythm, rate 74 bpm, with no acute ST/T changes.   Telemetry:  Telemetry was personally reviewed and demonstrates:  Normal sinus rhythm. No NSVT noted (consistent with artifact).  Relevant CV Studies:  Echocardiogram 05/08/2022: Impressions:  1. Left ventricular ejection fraction, by estimation, is 50 to 55%. The  left ventricle has low normal function. The left ventricle demonstrates  regional wall motion abnormalities with possible inferoseptal and inferior  hypokinesis but images very  difficult even with Definity. Left ventricular diastolic parameters are  consistent with Grade I diastolic dysfunction (impaired relaxation).   2. Right ventricular systolic  function is normal. The right ventricular  size is normal. Tricuspid regurgitation signal is inadequate for assessing  PA pressure.   3. The mitral valve was not well visualized. No evidence of mitral valve  regurgitation. No evidence of mitral stenosis.   4. The aortic valve is tricuspid. There is mild calcification of the  aortic valve. Aortic valve regurgitation is not visualized. No aortic  stenosis is present.   5. The inferior vena cava is normal in size with greater than 50%  respiratory variability, suggesting right atrial pressure of 3 mmHg.   6. Technically difficult study with very poor images.    Laboratory Data:  High Sensitivity Troponin:   Recent Labs  Lab 2022/05/13 0823 2022/05/13 1256  TROPONINIHS 133* 105*     Chemistry Recent Labs  Lab 05/08/22 1026 05/08/22 1250 05-13-2022 0256 05/10/22 0347  NA 136 135 139 137  K 3.7 3.5 3.5 3.9  CL 100  --  105 102  CO2 20*  --  22 23  GLUCOSE 269*  --  168* 208*  BUN 41*  --  42* 57*  CREATININE 2.83*  --  2.53* 2.56*  CALCIUM 8.4*  --  8.0* 7.9*  MG  --   --  1.8 2.4  GFRNONAA 19*  --  22* 21*  ANIONGAP 16*  --  12 12    Recent Labs  Lab 05/08/22 1026 05-13-22 0823 05/10/22 0347  PROT 6.2* 5.6* 5.2*  ALBUMIN 2.6* 1.9* 1.8*  AST 39 51* 45*  ALT 34 30 29  ALKPHOS 901* 192* 142*  BILITOT 1.7* 0.7 0.5   Lipids No results for input(s): "CHOL", "TRIG", "HDL", "LABVLDL", "LDLCALC", "CHOLHDL" in the last 168 hours.  Hematology Recent Labs  Lab 05/08/22 1026 05/08/22 1250 05/13/2022 0256 05/10/22 0347  WBC 4.8  --  29.5* 19.8*  RBC 5.06  --  4.26 4.02  HGB 15.1* 13.6 12.8 12.1  HCT 46.4* 40.0 37.8 36.0  MCV 91.7  --  88.7 89.6  MCH 29.8  --  30.0 30.1  MCHC 32.5  --  33.9 33.6  RDW 16.1*  --  16.2* 16.4*  PLT 108*  --  90* 76*   Thyroid No results for input(s): "TSH", "FREET4" in the last 168 hours.  BNPNo results for input(s): "BNP", "PROBNP" in the last 168 hours.  DDimer No results for input(s):  "DDIMER" in the last 168 hours.   Radiology/Studies:  US RENAL  Result Date: 13-May-2022 CLINICAL DATA:  Hydronephrosis EXAM: RENAL / URINARY TRACT ULTRASOUND COMPLETE COMPARISON:  Same-day CT chest/abdomen/pelvis FINDINGS: Right Kidney: Renal measurements: 12.2 cm x 6.2 cm x 6.2 cm = volume: 246 mL. Parenchymal echogenicity is normal. There is no hydronephrosis on the current study. Left Kidney: Renal measurements: 11.4 cm x 5.2 cm x 5.2 cm = volume: 160 mL. 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 Parenchymal echogenicity is normal. There is no hydronephrosis. Bladder: The bladder could not be identified. Other: None. IMPRESSION: Normal sonographic appearance of the kidneys with no hydronephrosis on the current study. Electronically Signed   By: Valetta Mole M.D.   On: May 13, 2022 10:25   CT CHEST ABDOMEN PELVIS WO CONTRAST  Result Date: May 13, 2022 CLINICAL DATA:  Sepsis. EXAM: CT CHEST, ABDOMEN AND PELVIS WITHOUT CONTRAST TECHNIQUE: Multidetector CT imaging of the chest, abdomen and pelvis was performed following the standard protocol without IV contrast. RADIATION DOSE REDUCTION: This exam was performed according to the departmental dose-optimization program which includes automated exposure control, adjustment of the mA and/or kV according to patient size and/or use of iterative reconstruction technique. COMPARISON:  Chest CT dated 06/19/2006 and radiograph dated 05/08/2022. FINDINGS: Evaluation of this exam is limited in the absence of intravenous contrast. Evaluation is also limited due to streak artifact caused by patient's arms. CT CHEST FINDINGS Cardiovascular: There is no cardiomegaly or pericardial effusion. There is coronary vascular calcification. Mild atherosclerotic calcification of the thoracic aorta. No aneurysmal dilatation. The central pulmonary arteries are grossly unremarkable. Left IJ central venous line with tip close to the cavoatrial junction. Mediastinum/Nodes: No  hilar or mediastinal adenopathy. The esophagus is grossly unremarkable. No mediastinal fluid collection. Lungs/Pleura: There are bibasilar dependent atelectasis. Right  upper lobe ground-glass streaky densities may represent atelectasis or infiltrate. Trace bilateral pleural effusions may be present. There is no pneumothorax. The central airways are patent. Musculoskeletal: No acute osseous pathology. Degenerative changes of the spine. CT ABDOMEN PELVIS FINDINGS No intra-abdominal free air or free fluid. Hepatobiliary: Fatty liver. Indeterminate low attenuating area involving the left lobe of the liver may represent more advanced CT steatosis. Other etiologies, including infarct, are not excluded but not evaluated on this noncontrast CT. No biliary dilatation. Cholecystectomy. Pancreas: Unremarkable. No pancreatic ductal dilatation or surrounding inflammatory changes. Spleen: Normal in size without focal abnormality. Adrenals/Urinary Tract: The adrenal glands are unremarkable there is a punctate stone in the distal right ureter at the ureterovesical junction. There is mild right hydronephrosis. Punctate nonobstructing left renal upper pole calculus is also seen. There is no hydronephrosis on the left. The left ureter is unremarkable. The urinary bladder is decompressed around a Foley catheter. Stomach/Bowel: A gastric lap band is seen. There is no bowel obstruction or active inflammation. The appendix is normal. Vascular/Lymphatic: Mild aortoiliac atherosclerotic disease. The IVC is unremarkable. No portal venous gas. There is no adenopathy. Reproductive: Hysterectomy.  No adnexal masses. Other: None Musculoskeletal: Degenerative changes of the spine. No acute osseous pathology. IMPRESSION: 1. Bibasilar dependent atelectasis. Right upper lobe ground-glass streaky densities may represent atelectasis or infiltrate. 2. A punctate right UVJ stone with mild right hydronephrosis. 3. Punctate nonobstructing left renal upper  pole calculus. No hydronephrosis on the left. 4. Fatty liver. Indeterminate low attenuating area involving the left lobe of the liver may represent more advanced CT steatosis. Other etiologies, including infarct, are not excluded but not evaluated on this noncontrast CT. 5. No bowel obstruction. Normal appendix. 6.  Aortic Atherosclerosis (ICD10-I70.0). Electronically Signed   By: Anner Crete M.D.   On: 05/09/2022 01:50   US Abdomen Limited RUQ (LIVER/GB)  Result Date: 05/08/2022 CLINICAL DATA:  Septic shock EXAM: ULTRASOUND ABDOMEN LIMITED RIGHT UPPER QUADRANT COMPARISON:  None Available. FINDINGS: Gallbladder: Surgically removed Common bile duct: Diameter: 2.5 mm. Liver: Diffusely increased in echogenicity without focal mass. Portal vein is patent on color Doppler imaging with normal direction of blood flow towards the liver. Other: None. IMPRESSION: Status post cholecystectomy. Fatty infiltration of the liver. Electronically Signed   By: Inez Catalina M.D.   On: 05/08/2022 19:26   DG CHEST PORT 1 VIEW  Result Date: 05/08/2022 CLINICAL DATA:  Check central line placement, shortness of breath EXAM: PORTABLE CHEST 1 VIEW COMPARISON:  Film from earlier in the same day. FINDINGS: New left jugular central line is noted at the cavoatrial junction. No pneumothorax is seen. Central vascular congestion is noted consistent with mild CHF. It appears slightly worsened when compare with the prior exam although this may be related to the poor inspiratory effort. No bony abnormality is seen. IMPRESSION: Status post central line placement.  No pneumothorax is noted. Changes of CHF. Electronically Signed   By: Inez Catalina M.D.   On: 05/08/2022 18:35   ECHOCARDIOGRAM COMPLETE  Result Date: 05/08/2022    ECHOCARDIOGRAM REPORT   Patient Name:   [name] Date of Exam: 05/08/2022 Medical Rec #:  [id number]       Height:       66.5 in Accession #:    [id number]      Weight:       310.0 lb Date of Birth:  26-Feb-1966         BSA:  2.423 m Patient Age:    41 years        BP:           89/58 mmHg Patient Gender: F               HR:           118 bpm. Exam Location:  Inpatient Procedure: 2D Echo STAT ECHO Indications:    acute respiratory failure  History:        Patient has no prior history of Echocardiogram examinations.                 Risk Factors:Diabetes, Hypertension and Dyslipidemia.  Sonographer:    Johny Chess RDCS Referring Phys: [place]  Sonographer Comments: Patient is obese. Image acquisition challenging due to patient body habitus, supine and Image acquisition challenging due to respiratory motion. IMPRESSIONS  1. Left ventricular ejection fraction, by estimation, is 50 to 55%. The left ventricle has low normal function. The left ventricle demonstrates regional wall motion abnormalities with possible inferoseptal and inferior hypokinesis but images very difficult even with Definity. Left ventricular diastolic parameters are consistent with Grade I diastolic dysfunction (impaired relaxation).  2. Right ventricular systolic function is normal. The right ventricular size is normal. Tricuspid regurgitation signal is inadequate for assessing PA pressure.  3. The mitral valve was not well visualized. No evidence of mitral valve regurgitation. No evidence of mitral stenosis.  4. The aortic valve is tricuspid. There is mild calcification of the aortic valve. Aortic valve regurgitation is not visualized. No aortic stenosis is present.  5. The inferior vena cava is normal in size with greater than 50% respiratory variability, suggesting right atrial pressure of 3 mmHg.  6. Technically difficult study with very poor images. FINDINGS  Left Ventricle: Left ventricular ejection fraction, by estimation, is 50 to 55%. The left ventricle has low normal function. The left ventricle demonstrates regional wall motion abnormalities. Definity contrast agent was given IV to delineate the left ventricular  endocardial borders. The left ventricular internal cavity size was normal in size. There is no left ventricular hypertrophy. Left ventricular diastolic parameters are consistent with Grade I diastolic dysfunction (impaired relaxation). Right Ventricle: The right ventricular size is normal. No increase in right ventricular wall thickness. Right ventricular systolic function is normal. Tricuspid regurgitation signal is inadequate for assessing PA pressure. Left Atrium: Left atrial size was normal in size. Right Atrium: Right atrial size was normal in size. Pericardium: There is no evidence of pericardial effusion. Mitral Valve: The mitral valve was not well visualized. No evidence of mitral valve regurgitation. No evidence of mitral valve stenosis. Tricuspid Valve: The tricuspid valve is normal in structure. Tricuspid valve regurgitation is not demonstrated. Aortic Valve: The aortic valve is tricuspid. There is mild calcification of the aortic valve. Aortic valve regurgitation is not visualized. No aortic stenosis is present. Pulmonic Valve: The pulmonic valve was normal in structure. Pulmonic valve regurgitation is not visualized. Aorta: The aortic root is normal in size and structure. Venous: The inferior vena cava is normal in size with greater than 50% respiratory variability, suggesting right atrial pressure of 3 mmHg. IAS/Shunts: No atrial level shunt detected by color flow Doppler.  LEFT VENTRICLE PLAX 2D LVIDd:         4.30 cm   Diastology LVIDs:         3.10 cm   LV e' medial:  4.79 cm/s LVOT diam:     2.00 cm   LV  e' lateral: 6.20 cm/s LVOT Area:     3.14 cm  RIGHT VENTRICLE             IVC RV S prime:     13.50 cm/s  IVC diam: 1.40 cm TAPSE (M-mode): 1.8 cm LEFT ATRIUM             Index        RIGHT ATRIUM          Index LA diam:        3.20 cm 1.32 cm/m   RA Area:     6.84 cm LA Vol (A2C):   28.0 ml 11.56 ml/m  RA Volume:   11.80 ml 4.87 ml/m LA Vol (A4C):   35.7 ml 14.74 ml/m LA Biplane Vol: 34.9 ml  14.41 ml/m   AORTA Ao Root diam: 3.00 cm  SHUNTS Systemic Diam: 2.00 cm Dalton McleanMD Electronically signed by Franki Monte Signature Date/Time: 05/08/2022/1:18:15 PM    Final    DG Chest Port 1 View  Result Date: 05/08/2022 CLINICAL DATA:  Questionable sepsis - evaluate for abnormality EXAM: PORTABLE CHEST - 1 VIEW COMPARISON:  07/06/2014 FINDINGS: Cardiac silhouette is prominent. There is pulmonary interstitial prominence with vascular congestion. No focal consolidation. No pneumothorax or pleural effusion identified. IMPRESSION: Findings suggest CHF. Electronically Signed   By: Sammie Bench M.D.   On: 05/08/2022 10:54     Assessment and Plan:   Elevated Troponin Patient was admitted with septic shock in setting of acute pyelonephritis and E.coli bactermia. Cardiology consulted for elevated troponin and possible NSVT. High-sensitivity troponin peaked at 133. EKG shows no acute ischemic changes. Echo showed LVEF of 50-55%. There was mention of possible inferoseptal and inferior hypokinesis but images were difficult even with difinity. Reviewed Echo with Dr. Martinique and there is not felt to be any significant regional wall motion abnormalities. Reviewed telemetry and do not see any evidence of NSVT. There is mention in other rounding notes from today of a short run of NSVT but this looks like artifact. She describes some very mild chest tightness but describes this as more feeling like she does when she has pneumonia. Nothing that sounds like angina. Troponin elevation is consistent with demand ischemia in setting of septic shock. Not consistent with ACS. No additional cardiac work-up is needed. She can follow-up with her PCP.  Otherwise, per primary team - Septic shock secondary to acute pyelonephritis - E.coli bacteremia - Hypertension - Hyperlipidemia - Type 2 diabetes mellitus - AKI - Obstructive right UVJ stone   Risk Assessment/Risk Scores:    For questions or updates, please  contact [place] Please consult [email or web address] for contact info under    Signed, Darreld Mclean, PA-C  05/10/2022 4:28 PM

## 2022-05-11 ENCOUNTER — Other Ambulatory Visit: Payer: Self-pay

## 2022-05-11 ENCOUNTER — Telehealth: Payer: Self-pay

## 2022-05-11 ENCOUNTER — Encounter (HOSPITAL_COMMUNITY): Payer: Self-pay | Admitting: Pulmonary Disease

## 2022-05-11 DIAGNOSIS — R6521 Severe sepsis with septic shock: Secondary | ICD-10-CM | POA: Diagnosis not present

## 2022-05-11 DIAGNOSIS — A419 Sepsis, unspecified organism: Secondary | ICD-10-CM | POA: Diagnosis not present

## 2022-05-11 LAB — CULTURE, BLOOD (ROUTINE X 2): Special Requests: ADEQUATE

## 2022-05-11 LAB — GLUCOSE, CAPILLARY
Glucose-Capillary: 71 mg/dL (ref 70–99)
Glucose-Capillary: 123 mg/dL — ABNORMAL HIGH (ref 70–99)
Glucose-Capillary: 147 mg/dL — ABNORMAL HIGH (ref 70–99)
Glucose-Capillary: 142 mg/dL — ABNORMAL HIGH (ref 70–99)

## 2022-05-11 MED ORDER — IPRATROPIUM-ALBUTEROL 0.5-2.5 (3) MG/3ML IN SOLN: 3.0000 mL | RESPIRATORY_TRACT | Status: DC | PRN

## 2022-05-11 MED ORDER — IPRATROPIUM-ALBUTEROL 0.5-2.5 (3) MG/3ML IN SOLN
3.0000 mL | Freq: Four times a day (QID) | RESPIRATORY_TRACT | Status: DC
  Administered 2022-05-11 – 2022-05-12 (×2): 3 mL via RESPIRATORY_TRACT
  Filled 2022-05-11 (×3): qty 3

## 2022-05-11 MED ORDER — ORAL CARE MOUTH RINSE: 15.0000 mL | OROMUCOSAL | Status: DC | PRN

## 2022-05-11 NOTE — Progress Notes (Signed)
Care Management & Coordination Services Pharmacy Team  Reason for Encounter: Appointment Reminder  Contacted patient to confirm in office appointment with Charlene Brooke, PharmD on 05/17/2022 at 2:00.  Unsuccessful outreach. Left voicemail for patient to return call.  Star Rating Drugs:  Medication:                Last Fill:         Day Supply Atorvastatin 40 mg       Trulicity 3 mg               03/27/22          30 Jardiance 25 mg         03/20/22          30 Losartan 50 mg           03/06/22          90   Care Gaps: Annual wellness visit in last year? Yes 09/30/21   If Diabetic: Last eye exam / retinopathy screening: Overdue Last diabetic foot exam: Overdue  Recent office visits:  01/19/22 Patient Message concerning cough Start: chlorpheniramine-HYDROcodone ([place]) 10-8 MG/5ML   Recent consult visits:  05/06/22 [name], NP (Family Med) Maxillary Sinusitis Start: Doxycycline 100 mg Start: Mucinex D 02/01/22 [name], FNP (Endo) DM No med changes F/U 3 months 01/12/22 [name], MD Asthma Start: doxycycline (VIBRA-TABS) 100 MG tablet Start: chlorpheniramine-HYDROcodone ([place]) 10-8 MG/5ML Change: insulin degludec (TRESIBA) 100 UNIT/ML FlexTouch Pen Change: silver sulfADIAZINE (SILVADENE) 1 % cream 1 Application Stop (Patient): budesonide (ENTOCORT EC) 3 MG 24 hr capsule Stop (Patient): colestipol (COLESTID) 1 g tablet Stop (Patient): SANTYL 250 UNIT/GM ointment Stop (Patient): ketoconazole (NIZORAL) 2 % cream  11/30/21 [name], DO (Wound Healing) Wound care on left second toe.  11/23/21 [name], DO (Wound Healing) Wound care on left second toe.  11/16/21 [name], DO (Wound Healing) Wound care on left second toe.  11/09/21 [name], DO (Wound Healing) Wound care on left second toe.  11/02/21 [name], DO (Wound Healing) Wound care on left second toe.  10/24/21 [name], NP Urinary Frequency Start: silver sulfADIAZINE  (SILVADENE) 1 % cream 1 Application Stop (completed): Benzonatate 200 mg Stop (completed): Dextromethorphan guaifenseign 100-10 mg Stop (completed): chlorpheniramine-HYDROcodone (TUSSIONEX PENNKINETIC ER) 10-8 MG/5ML 10/19/21 [name] 09/18/21 Fenton Malling, PA Suspected UTI Start: nitrofurantoin, macrocrystal-monohydrate, (MACROBID) 100 MG capsule   Hospital visits:  05/08/22 Patient admitted to hospital on 05/08/2022.  Discharge date was 05/11/2022. Admitted to [facility] Septic Shock Change: Hold Metoprolol   Charlene Brooke, PharmD notified  [name], [place] Assistant [phone]

## 2022-05-11 NOTE — Care Management Important Message (Signed)
Important Message  Patient Details  Name: [name] MRN: [id number] Date of Birth: [birth year]   Medicare Important Message Given:  Yes     Shelda Altes 05/11/2022, 9:34 AM

## 2022-05-11 NOTE — Progress Notes (Signed)
Patient called RN, states she feels wheezing in her chest and tightness with deep breaths.  Patient has h/o asthma.  On call MD, [place] Crane paged.  Awaiting call back. Report given to night shift RN, Mendy,  who will f/u if orders not placed.

## 2022-05-11 NOTE — Progress Notes (Signed)
PROGRESS NOTE    [name]  [id number] DOB: 12/15/1965 DOA: 05/08/2022 PCP: Pleas Koch, NP  Chief Complaint  Patient presents with   Shortness of Breath    Brief Narrative:   Patient is [name] 57 year old female with past medical history as below, which is significant for diabetes, asthma, hypertension, MRSA infection, GERD, thyroid disease, and sleep apnea (long ago).  She presented to [facility] emergency department on 2/19 with complaints of URI symptoms and malaise x 6 days now complicated by nausea and vomiting x 24 hours.  1 day prior to arrival, she was started on doxycycline by PCP.  Upon arrival to the emergency department she was noted to be febrile and hypotensive.  With concern for sepsis she was started immediately on empiric antibiotics and provided with IVF resuscitation.  Lactic acid elevated at 7.  Despite IVF resuscitation, she remained hypotensive and was started on norepinephrine infusion. Echocardiogram in the ED did not demonstrate any significant cardiac dysfunction, but was volume down. PCCM admitted to ICU and continued volume resuscitation.  Did not intubate as patient respiratory status gradually became more stable on nasal cannula.    Assessment & Plan:   Principal Problem:   Septic shock ([place]) Active Problems:   Bacteremia  Septic shock E. coli bacteremia Lactic acidosis, resolved Continue CTX 10 days for E. coli bacteremia.  Leukocytosis improving, lactic acidosis resolved.  Improving and clinically stable to move out of ICU. - Continue CTX 2/20 - present - unclear source, in setting of UVJ stone and mild hydro, suspect UTI most likely source though urine culture negative - Discontinue stress dose hydrocortisone   AKI, unchanged Obstructive R UVJ stone and hydronephrosis Baseline appears to be <1 Mild improvement from presentation, but stable over past few days UA with 6-10 RBC's, 30 mg/dl protein Initial CT scan with R UVJ stone with mild right  hydro Renal US 2/20 with normal appearance of kidneys without hydro  Repeat UA Will discuss with urology given persistent hydro May need renal input  Strict I/O, daily weights  Acute hypoxic respiratory failure with hypercarbia Continues to be stable on nasal cannula, gradually weaning.  Atelectasis and recent illness likely limiting respiratory reserve. - SpO2 goal > 92%, wean as able - Encourage incentive spirometry hourly - Repeat outpatient sleep study, possible BiPAP need   Hypokinesis of septum on TTE Nonsustained run of Vtach EKG reassuring this morning, but concern given that patient had symptomatic Vtach in setting of TTE findings and persistently elevated troponin.  Concern for missed MI or other cardiac etiology. - echo with RWMA with inferoseptal and inferior hypokinesis (difficult images), grade 1 diastolic dysfunction - appreciate cardiology recs - no definite wall motion abnormality, troponin leak thought c/w septic shock, no concern for ACS.  NSVT artifact.   T2DM with hyperglycemia Hgb A1c 7.8 04/2022 on Jardiance and Trulicity at home as well as Novolog sliding scale (20-26 units) TID with meals and tresiba 40 units twice daily.  C - Hold home Jardiance and Trulicity in setting of acute illness - 25 U levemir BID and SSI - adjust as needed   HTN Metoprolol on hold      DVT prophylaxis: heparin Code Status: full Family Communication: none Disposition:   Status is: Inpatient Remains inpatient appropriate because: need for continued inpatient workup   Consultants:  PCCM cards  Procedures:  Echo IMPRESSIONS     1. Left ventricular ejection fraction, by estimation, is 50 to 55%. The  left ventricle has  low normal function. The left ventricle demonstrates  regional wall motion abnormalities with possible inferoseptal and inferior  hypokinesis but images very  difficult even with Definity. Left ventricular diastolic parameters are  consistent with Grade  I diastolic dysfunction (impaired relaxation).   2. Right ventricular systolic function is normal. The right ventricular  size is normal. Tricuspid regurgitation signal is inadequate for assessing  PA pressure.   3. The mitral valve was not well visualized. No evidence of mitral valve  regurgitation. No evidence of mitral stenosis.   4. The aortic valve is tricuspid. There is mild calcification of the  aortic valve. Aortic valve regurgitation is not visualized. No aortic  stenosis is present.   5. The inferior vena cava is normal in size with greater than 50%  respiratory variability, suggesting right atrial pressure of 3 mmHg.   6. Technically difficult study with very poor images.   Antimicrobials:  Anti-infectives (From admission, onward)    Start     Dose/Rate Route Frequency Ordered Stop   05/09/22 1300  cefTRIAXone (ROCEPHIN) 2 g in sodium chloride 0.9 % 100 mL IVPB        2 g 200 mL/hr over 30 Minutes Intravenous Every 24 hours 05/09/22 0914 05/19/22 0959   05/08/22 2300  linezolid (ZYVOX) IVPB 600 mg  Status:  Discontinued        600 mg 300 mL/hr over 60 Minutes Intravenous Every 12 hours 05/08/22 1807 05/09/22 0409   05/08/22 1900  aztreonam (AZACTAM) 1 g in sodium chloride 0.9 % 100 mL IVPB  Status:  Discontinued        1 g 200 mL/hr over 30 Minutes Intravenous Every 8 hours 05/08/22 1210 05/09/22 0914   05/08/22 1100  linezolid (ZYVOX) IVPB 600 mg        600 mg 300 mL/hr over 60 Minutes Intravenous Once 05/08/22 1050 05/08/22 1255   05/08/22 1030  aztreonam (AZACTAM) 2 g in sodium chloride 0.9 % 100 mL IVPB        2 g 200 mL/hr over 30 Minutes Intravenous  Once 05/08/22 1028 05/08/22 1156       Subjective: No new complaints  Objective: Vitals:   05/11/22 0524 05/11/22 0700 05/11/22 0822 05/11/22 1151  BP: 129/71 131/61 133/72 127/68  Pulse:  80 84 79  Resp: 17 17 18 20  $ Temp: 97.8 F (36.6 C) 97.8 F (36.6 C)  97.7 F (36.5 C)  TempSrc: Oral Oral  Oral   SpO2: 98% 99% 97% 99%  Weight:      Height:        Intake/Output Summary (Last 24 hours) at 05/11/2022 1436 Last data filed at 05/11/2022 [id number] Gross per 24 hour  Intake 33.83 ml  Output 1000 ml  Net -966.17 ml   Filed Weights   05/10/22 0600 05/11/22 0500  Weight: (!) 155.4 kg (!) 156 kg    Examination:  General exam: Appears calm and comfortable  Respiratory system: Clear to auscultation. Respiratory effort normal. Cardiovascular system:RRR Gastrointestinal system: mild R sided flank pain Central nervous system: Alert and oriented. No focal neurological deficits. Extremities: no LEE    Data Reviewed: I have personally reviewed following labs and imaging studies  CBC: Recent Labs  Lab 05/08/22 1026 05/08/22 1250 05/09/22 0256 05/10/22 0347  WBC 4.8  --  29.5* 19.8*  NEUTROABS 4.0  --   --  15.6*  HGB 15.1* 13.6 12.8 12.1  HCT 46.4* 40.0 37.8 36.0  MCV 91.7  --  88.7  89.6  PLT 108*  --  90* 76*    Basic Metabolic Panel: Recent Labs  Lab 05/08/22 1026 05/08/22 1250 05/09/22 0256 05/10/22 0347  NA 136 135 139 137  K 3.7 3.5 3.5 3.9  CL 100  --  105 102  CO2 20*  --  22 23  GLUCOSE 269*  --  168* 208*  BUN 41*  --  42* 57*  CREATININE 2.83*  --  2.53* 2.56*  CALCIUM 8.4*  --  8.0* 7.9*  MG  --   --  1.8 2.4  PHOS  --   --  2.2* 4.3    GFR: Estimated Creatinine Clearance: 37.8 mL/min (Alder Murri) (by C-G formula based on SCr of 2.56 mg/dL (H)).  Liver Function Tests: Recent Labs  Lab 05/08/22 1026 05/09/22 0823 05/10/22 0347  AST 39 51* 45*  ALT 34 30 29  ALKPHOS 901* 192* 142*  BILITOT 1.7* 0.7 0.5  PROT 6.2* 5.6* 5.2*  ALBUMIN 2.6* 1.9* 1.8*    CBG: Recent Labs  Lab 05/10/22 1151 05/10/22 1623 05/10/22 2147 05/11/22 0800 05/11/22 1148  GLUCAP 222* 210* 155* 71 123*     Recent Results (from the past 240 hour(s))  Blood Culture (routine x 2)     Status: Abnormal   Collection Time: 05/08/22 10:15 AM   Specimen: BLOOD  Result Value Ref  Range Status   Specimen Description BLOOD LEFT ANTECUBITAL  Final   Special Requests   Final    BOTTLES DRAWN AEROBIC AND ANAEROBIC Blood Culture adequate volume   Culture  Setup Time   Final    GRAM NEGATIVE RODS IN BOTH AEROBIC AND ANAEROBIC BOTTLES CRITICAL RESULT CALLED TO, READ BACK BY AND VERIFIED WITH: J WYLAND,PHARMD@0330$  05/09/22 [place] Performed at [facility], [place] [address] [place], [place] [address]    Culture ESCHERICHIA COLI (Jannah Guardiola)  Final   Report Status 05/10/2022 FINAL  Final   Organism ID, Bacteria ESCHERICHIA COLI  Final      Susceptibility   Escherichia coli - MIC*    AMPICILLIN >=32 RESISTANT Resistant     CEFEPIME <=0.12 SENSITIVE Sensitive     CEFTAZIDIME <=1 SENSITIVE Sensitive     CEFTRIAXONE <=0.25 SENSITIVE Sensitive     CIPROFLOXACIN <=0.25 SENSITIVE Sensitive     GENTAMICIN >=16 RESISTANT Resistant     IMIPENEM <=0.25 SENSITIVE Sensitive     TRIMETH/SULFA >=320 RESISTANT Resistant     AMPICILLIN/SULBACTAM 16 INTERMEDIATE Intermediate     PIP/TAZO <=4 SENSITIVE Sensitive     * ESCHERICHIA COLI  Blood Culture ID Panel (Reflexed)     Status: Abnormal   Collection Time: 05/08/22 10:15 AM  Result Value Ref Range Status   Enterococcus faecalis NOT DETECTED NOT DETECTED Final   Enterococcus Faecium NOT DETECTED NOT DETECTED Final   Listeria monocytogenes NOT DETECTED NOT DETECTED Final   Staphylococcus species NOT DETECTED NOT DETECTED Final   Staphylococcus aureus (BCID) NOT DETECTED NOT DETECTED Final   Staphylococcus epidermidis NOT DETECTED NOT DETECTED Final   Staphylococcus lugdunensis NOT DETECTED NOT DETECTED Final   Streptococcus species NOT DETECTED NOT DETECTED Final   Streptococcus agalactiae NOT DETECTED NOT DETECTED Final   Streptococcus pneumoniae NOT DETECTED NOT DETECTED Final   Streptococcus pyogenes NOT DETECTED NOT DETECTED Final   Kushal Saunders.calcoaceticus-baumannii NOT DETECTED NOT DETECTED Final   Bacteroides fragilis NOT DETECTED NOT  DETECTED Final   Enterobacterales DETECTED (Truxton Stupka) NOT DETECTED Final    Comment: Enterobacterales represent Ahren Pettinger large order of gram negative  bacteria, not Genean Adamski single organism. CRITICAL RESULT CALLED TO, READ BACK BY AND VERIFIED WITH: J WYLAND,PHARMD@0330$  05/09/22 [place]    Enterobacter cloacae complex NOT DETECTED NOT DETECTED Final   Escherichia coli DETECTED (Fujiko Picazo) NOT DETECTED Final    Comment: CRITICAL RESULT CALLED TO, READ BACK BY AND VERIFIED WITH: J WYLAND,PHARMD@0330$  05/09/22 [place]    Klebsiella aerogenes NOT DETECTED NOT DETECTED Final   Klebsiella oxytoca NOT DETECTED NOT DETECTED Final   Klebsiella pneumoniae NOT DETECTED NOT DETECTED Final   Proteus species NOT DETECTED NOT DETECTED Final   Salmonella species NOT DETECTED NOT DETECTED Final   Serratia marcescens NOT DETECTED NOT DETECTED Final   Haemophilus influenzae NOT DETECTED NOT DETECTED Final   Neisseria meningitidis NOT DETECTED NOT DETECTED Final   Pseudomonas aeruginosa NOT DETECTED NOT DETECTED Final   Stenotrophomonas maltophilia NOT DETECTED NOT DETECTED Final   Candida albicans NOT DETECTED NOT DETECTED Final   Candida auris NOT DETECTED NOT DETECTED Final   Candida glabrata NOT DETECTED NOT DETECTED Final   Candida krusei NOT DETECTED NOT DETECTED Final   Candida parapsilosis NOT DETECTED NOT DETECTED Final   Candida tropicalis NOT DETECTED NOT DETECTED Final   Cryptococcus neoformans/gattii NOT DETECTED NOT DETECTED Final   CTX-M ESBL NOT DETECTED NOT DETECTED Final   Carbapenem resistance IMP NOT DETECTED NOT DETECTED Final   Carbapenem resistance KPC NOT DETECTED NOT DETECTED Final   Carbapenem resistance NDM NOT DETECTED NOT DETECTED Final   Carbapenem resist OXA 48 LIKE NOT DETECTED NOT DETECTED Final   Carbapenem resistance VIM NOT DETECTED NOT DETECTED Final    Comment: Performed at [facility], [address] [place], [place]  Resp panel by RT-PCR (RSV, Flu Lisvet Rasheed&B, Covid) Anterior Nasal Swab      Status: None   Collection Time: 05/08/22 10:26 AM   Specimen: Anterior Nasal Swab  Result Value Ref Range Status   SARS Coronavirus 2 by RT PCR NEGATIVE NEGATIVE Final   Influenza Shaneta Cervenka by PCR NEGATIVE NEGATIVE Final   Influenza B by PCR NEGATIVE NEGATIVE Final    Comment: (NOTE) The Xpert Xpress SARS-CoV-2/FLU/RSV plus assay is intended as an aid in the diagnosis of influenza from Nasopharyngeal swab specimens and should not be used as Nayelli Inglis sole basis for treatment. Nasal washings and aspirates are unacceptable for Xpert Xpress SARS-CoV-2/FLU/RSV testing.  Fact Sheet for Patients: 05/21/22  Fact Sheet for Healthcare Providers: EntrepreneurPulse.com.au  This test is not yet approved or cleared by the [employer]redibleEmployment.[employer] FDA and has been authorized for detection and/or diagnosis of SARS-CoV-2 by FDA under an Emergency Use Authorization (EUA). This EUA will remain in effect (meaning this test can be used) for the duration of the COVID-19 declaration under Section 564(b)(1) of the Act, 21 U.S.C. section 360bbb-3(b)(1), unless the authorization is terminated or revoked.     Resp Syncytial Virus by PCR NEGATIVE NEGATIVE Final    Comment: (NOTE) Fact Sheet for Patients: [place]  Fact Sheet for Healthcare Providers: EntrepreneurPulse.com.au  This test is not yet approved or cleared by the IncredibleEmployment.be FDA and has been authorized for detection and/or diagnosis of SARS-CoV-2 by FDA under an Emergency Use Authorization (EUA). This EUA will remain in effect (meaning this test can be used) for the duration of the COVID-19 declaration under Section 564(b)(1) of the Act, 21 U.S.C. section 360bbb-3(b)(1), unless the authorization is terminated or revoked.  Performed at [facility], [place] [address], [place], [place]   Blood Culture (routine  x 2)     Status: Abnormal   Collection  Time: 05/08/22 10:30 AM   Specimen: BLOOD  Result Value Ref Range Status   Specimen Description BLOOD RIGHT ANTECUBITAL  Final   Special Requests   Final    BOTTLES DRAWN AEROBIC AND ANAEROBIC Blood Culture adequate volume   Culture  Setup Time   Final    GRAM NEGATIVE RODS IN BOTH AEROBIC AND ANAEROBIC BOTTLES CRITICAL VALUE NOTED.  VALUE IS CONSISTENT WITH PREVIOUSLY REPORTED AND CALLED VALUE.    Culture (Mckynna Vanloan)  Final    ESCHERICHIA COLI SUSCEPTIBILITIES PERFORMED ON PREVIOUS CULTURE WITHIN THE LAST 5 DAYS. Performed at [facility], [place] [address] [place], [place] [address]    Report Status 05/11/2022 FINAL  Final  Urine Culture     Status: Abnormal   Collection Time: 05/09/22  2:58 AM   Specimen: Urine, Catheterized  Result Value Ref Range Status   Specimen Description URINE, CATHETERIZED  Final   Special Requests NONE Reflexed from [id number]  Final   Culture (Rollyn Scialdone)  Final    <10,000 COLONIES/mL INSIGNIFICANT GROWTH Performed at [facility], [place] [address], [place], [place] [address]    Report Status 05/10/2022 FINAL  Final         Radiology Studies: No results found.      Scheduled Meds:  gabapentin  200 mg Oral BID   heparin  5,000 Units Subcutaneous Q8H   insulin aspart  0-20 Units Subcutaneous TID WC   insulin aspart  0-5 Units Subcutaneous QHS   insulin detemir  25 Units Subcutaneous BID   sodium chloride flush  10-40 mL Intracatheter Q12H   Continuous Infusions:  cefTRIAXone (ROCEPHIN)  IV 200 mL/hr at 05/11/22 0917     LOS: 3 days    Time spent: over 30 min    Fayrene Helper, MD [facility]   To contact the attending provider between 7A-7P or the covering provider during after hours 7P-7A, please log into the web site [email or web address] and access using universal [place] password for that web site. If you do not have the password, please call the hospital operator.  05/11/2022, 2:36 PM

## 2022-05-12 DIAGNOSIS — R6521 Severe sepsis with septic shock: Secondary | ICD-10-CM | POA: Diagnosis not present

## 2022-05-12 DIAGNOSIS — A419 Sepsis, unspecified organism: Secondary | ICD-10-CM | POA: Diagnosis not present

## 2022-05-12 LAB — URINALYSIS, ROUTINE W REFLEX MICROSCOPIC
Bacteria, UA: NONE SEEN
Bilirubin Urine: NEGATIVE
Glucose, UA: >=500 mg/dL — AB
Ketones, ur: 5 mg/dL — AB
Leukocytes,Ua: NEGATIVE
Nitrite: NEGATIVE
Protein, ur: NEGATIVE mg/dL
Specific Gravity, Urine: 1.012 (ref 1.005–1.030)
pH: 6 (ref 5.0–8.0)

## 2022-05-12 LAB — COMPREHENSIVE METABOLIC PANEL
ALT: 25 U/L (ref 0–44)
AST: 32 U/L (ref 15–41)
Albumin: 2 g/dL — ABNORMAL LOW (ref 3.5–5.0)
Alkaline Phosphatase: 111 U/L (ref 38–126)
Anion gap: 10 (ref 5–15)
BUN: 58 mg/dL — ABNORMAL HIGH (ref 6–20)
CO2: 22 mmol/L (ref 22–32)
Calcium: 7.8 mg/dL — ABNORMAL LOW (ref 8.9–10.3)
Chloride: 105 mmol/L (ref 98–111)
Creatinine, Ser: 1.96 mg/dL — ABNORMAL HIGH (ref 0.44–1.00)
GFR, Estimated: 29 mL/min — ABNORMAL LOW (ref >60)
Glucose, Bld: 122 mg/dL — ABNORMAL HIGH (ref 70–99)
Potassium: 3.7 mmol/L (ref 3.5–5.1)
Sodium: 137 mmol/L (ref 135–145)
Total Bilirubin: 0.7 mg/dL (ref 0.3–1.2)
Total Protein: 5.3 g/dL — ABNORMAL LOW (ref 6.5–8.1)

## 2022-05-12 LAB — CBC WITH DIFFERENTIAL/PLATELET
Abs Immature Granulocytes: 0.68 10*3/uL — ABNORMAL HIGH (ref 0.00–0.07)
Basophils Absolute: 0 10*3/uL (ref 0.0–0.1)
Basophils Relative: 0 %
Eosinophils Absolute: 0.2 10*3/uL (ref 0.0–0.5)
Eosinophils Relative: 2 %
HCT: 38.1 % (ref 36.0–46.0)
Hemoglobin: 12.7 g/dL (ref 12.0–15.0)
Immature Granulocytes: 7 %
Lymphocytes Relative: 24 %
Lymphs Abs: 2.5 10*3/uL (ref 0.7–4.0)
MCH: 29.3 pg (ref 26.0–34.0)
MCHC: 33.3 g/dL (ref 30.0–36.0)
MCV: 87.8 fL (ref 80.0–100.0)
Monocytes Absolute: 0.5 10*3/uL (ref 0.1–1.0)
Monocytes Relative: 5 %
Neutro Abs: 6.4 10*3/uL (ref 1.7–7.7)
Neutrophils Relative %: 62 %
Platelets: 109 10*3/uL — ABNORMAL LOW (ref 150–400)
RBC: 4.34 MIL/uL (ref 3.87–5.11)
RDW: 15.6 % — ABNORMAL HIGH (ref 11.5–15.5)
WBC: 10.4 10*3/uL (ref 4.0–10.5)
nRBC: 0.2 % (ref 0.0–0.2)

## 2022-05-12 LAB — GLUCOSE, CAPILLARY
Glucose-Capillary: 112 mg/dL — ABNORMAL HIGH (ref 70–99)
Glucose-Capillary: 120 mg/dL — ABNORMAL HIGH (ref 70–99)
Glucose-Capillary: 158 mg/dL — ABNORMAL HIGH (ref 70–99)
Glucose-Capillary: 191 mg/dL — ABNORMAL HIGH (ref 70–99)

## 2022-05-12 LAB — MAGNESIUM: Magnesium: 2.2 mg/dL (ref 1.7–2.4)

## 2022-05-12 LAB — PHOSPHORUS: Phosphorus: 4.2 mg/dL (ref 2.5–4.6)

## 2022-05-12 MED ORDER — GABAPENTIN 300 MG PO CAPS
300.0000 mg | ORAL_CAPSULE | Freq: Two times a day (BID) | ORAL | Status: DC
  Administered 2022-05-13 – 2022-05-14 (×4): 300 mg via ORAL
  Filled 2022-05-12 (×4): qty 1

## 2022-05-12 NOTE — Evaluation (Signed)
Occupational Therapy Evaluation Patient Details Name: [name] MRN: [id number] DOB: [birth year] Today's Date: 05/12/2022   History of Present Illness Patient is a 57 year old female admitted 2/19   presented to [facility] emergency department with complaints of URI symptoms and malaise x 6 days now complicated by nausea and vomiting x 24 hours. Found to have septic shock. PMH:   diabetes, asthma, hypertension, MRSA infection, GERD, thyroid disease, and sleep apnea (long ago).   Clinical Impression    Pt currently with functional limitations due to the deficits listed below (see OT Problem List). Prior to admit, pt was living at home with Husband and daughter. She was provided with some assistance with BADL tasks and functional mobility utilizing a RW.  Pt will benefit from skilled OT to increase their safety and independence with ADL and functional mobility for ADL to facilitate discharge to venue listed below. OT will continue to follow patient acutely.       Recommendations for follow up therapy are one component of a multi-disciplinary discharge planning process, led by the attending physician.  Recommendations may be updated based on patient status, additional functional criteria and insurance authorization.   Follow Up Recommendations  Home health OT     Assistance Recommended at Discharge Intermittent Supervision/Assistance  Patient can return home with the following Assistance with cooking/housework;A lot of help with bathing/dressing/bathroom;A little help with walking and/or transfers    Functional Status Assessment  Patient has had a recent decline in their functional status and demonstrates the ability to make significant improvements in function in a reasonable and predictable amount of time.  Equipment Recommendations  None recommended by OT       Precautions / Restrictions Precautions Precautions: Fall Precaution Comments: Pt has a "spastic" right  leg Restrictions Weight Bearing Restrictions: No      Mobility Bed Mobility Overal bed mobility: Needs Assistance Bed Mobility: Supine to Sit, Sit to Supine     Supine to sit: Min assist Sit to supine: Min assist   General bed mobility comments: Needs a little assist for LEs for getting in and out of bed.    Transfers Overall transfer level: Needs assistance Equipment used: Rolling walker (2 wheels) Transfers: Sit to/from Stand Sit to Stand: Min assist, +2 safety/equipment       General transfer comment: Needs a little assist to rise especially from lower surfaces as well as she must have rail to push up on (has house arranged accordingly. Pt needs cues for hand placement and needs right LE blocked so it doesnt slide with sit to stand.      Balance Overall balance assessment: Needs assistance Sitting-balance support: No upper extremity supported, Feet supported Sitting balance-Leahy Scale: Fair     Standing balance support: Bilateral upper extremity supported, During functional activity Standing balance-Leahy Scale: Poor Standing balance comment: relies on AD and guard assist for safety       ADL either performed or assessed with clinical judgement   ADL Overall ADL's : Needs assistance/impaired Eating/Feeding: Set up;Bed level   Grooming: Wash/dry hands;Wash/dry face;Oral care;Brushing hair;Minimal assistance;Bed level   Upper Body Bathing: Moderate assistance;Bed level   Lower Body Bathing: Maximal assistance;Bed level   Upper Body Dressing : Minimal assistance;Sitting   Lower Body Dressing: Total assistance;Bed level   Toilet Transfer: Minimal assistance;Ambulation;BSC/3in1   Toileting- Water quality scientist and Hygiene: Total assistance;Sit to/from stand (baseline)       Vision Baseline Vision/History: 1 Wears glasses Ability to See in Adequate Light: 0 Adequate  Patient Visual Report: Other (comment) (reports intermittent "fuzzy" vision which is  more prominent when up and moving.) Vision Assessment?: No apparent visual deficits            Pertinent Vitals/Pain Pain Assessment Pain Assessment: No/denies pain     Hand Dominance Right   Extremity/Trunk Assessment Upper Extremity Assessment Upper Extremity Assessment: Overall WFL for tasks assessed   Lower Extremity Assessment Lower Extremity Assessment: Defer to PT evaluation   Cervical / Trunk Assessment Cervical / Trunk Assessment: Kyphotic   Communication Communication Communication: No difficulties   Cognition Arousal/Alertness: Awake/alert Behavior During Therapy: WFL for tasks assessed/performed Overall Cognitive Status: Within Functional Limits for tasks assessed     General Comments  129/64, 92 HR, HR to 135 bpm with activity and took incr time to recover.            Home Living Family/patient expects to be discharged to:: Private residence Living Arrangements: Spouse/significant other;Children (adult daughter) Available Help at Discharge: Family;Available 24 hours/day (Husband is available 24/7) Type of Home: House Home Access: Ramped entrance     Home Layout: One level     Bathroom Shower/Tub: Teacher, early years/pre: Handicapped height Bathroom Accessibility: Yes How Accessible: Accessible via walker Home Equipment: Shower seat - built in;Grab bars - tub/shower;Hand held shower head;Toilet riser;BSC/3in1;Rolling Environmental consultant (2 wheels);Wheelchair - Building services engineer;Other (comment);Adaptive equipment (adjustable bed frame) Adaptive Equipment: Reacher;Long-handled sponge Additional Comments: Manages medications and finances      Prior Functioning/Environment Prior Level of Function : Needs assist       Physical Assist : Mobility (physical);ADLs (physical) Mobility (physical): Transfers ADLs (physical): Dressing Mobility Comments: Reports limited functional use of RLE ~5 yrs. 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 Able to walk very short distance at baseline ~50 feet. ADLs Comments: Needed assist to don socks and to get pants and underwear started once started she is able to pull them up - in bed; if she is going to stand to pull them up, she will need someone to hold them so they don't fall down.        OT Problem List: Decreased strength;Decreased activity tolerance;Impaired balance (sitting and/or standing);Obesity      OT Treatment/Interventions: Self-care/ADL training;Therapeutic exercise;Energy conservation;DME and/or AE instruction;Therapeutic activities;Patient/family education;Balance training    OT Goals(Current goals can be found in the care plan section) Acute Rehab OT Goals Patient Stated Goal: to get stronger OT Goal Formulation: With patient Time For Goal Achievement: 05/26/22 Potential to Achieve Goals: Good  OT Frequency: Min 2X/week    Co-evaluation PT/OT/SLP Co-Evaluation/Treatment: Yes Reason for Co-Treatment: To address functional/ADL transfers;Complexity of the patient's impairments (multi-system involvement);For patient/therapist safety   OT goals addressed during session: ADL's and self-care;Strengthening/ROM;Proper use of Adaptive equipment and DME      AM-PAC OT "6 Clicks" Daily Activity     Outcome Measure Help from another person eating meals?: A Little Help from another person taking care of personal grooming?: A Little Help from another person toileting, which includes using toliet, bedpan, or urinal?: Total Help from another person bathing (including washing, rinsing, drying)?: Total Help from another person to put on and taking off regular upper body clothing?: A Little Help from another person to put on and taking off regular lower body clothing?: Total 6 Click Score: 12   End of Session Equipment Utilized During Treatment: Gait belt;Rolling walker (2 wheels)  Activity Tolerance: Patient limited by fatigue;Patient tolerated treatment  well Patient left: in bed;with  call bell/phone within reach;with bed alarm set;with family/visitor present  OT Visit Diagnosis: Unsteadiness on feet (R26.81);Muscle weakness (generalized) (M62.81)                Time: [id number] OT Time Calculation (min): 34 min Charges:  OT General Charges $OT Visit: 1 Visit OT Evaluation $OT Eval Moderate Complexity: 1 Mod OT Treatments $Self Care/Home Management : 8-22 mins  Ailene Ravel, OTR/L,CBIS  Supplemental OT - [facility] and [facility] Secure Chat Preferred    Gracee Ratterree, Clarene Duke 05/12/2022, 5:06 PM

## 2022-05-12 NOTE — Progress Notes (Signed)
PROGRESS NOTE    [name]  [id number] DOB: 10/01/1965 DOA: 05/08/2022 PCP: Pleas Koch, NP  Chief Complaint  Patient presents with   Shortness of Breath    Brief Narrative:   Patient is [name] 57 year old female with past medical history as below, which is significant for diabetes, asthma, hypertension, MRSA infection, GERD, thyroid disease, and sleep apnea (long ago).  She presented to [facility] emergency department on 2/19 with complaints of URI symptoms and malaise x 6 days now complicated by nausea and vomiting x 24 hours.  1 day prior to arrival, she was started on doxycycline by PCP.  Upon arrival to the emergency department she was noted to be febrile and hypotensive.  With concern for sepsis she was started immediately on empiric antibiotics and provided with IVF resuscitation.  Lactic acid elevated at 7.  Despite IVF resuscitation, she remained hypotensive and was started on norepinephrine infusion. Echocardiogram in the ED did not demonstrate any significant cardiac dysfunction, but was volume down. PCCM admitted to ICU and continued volume resuscitation.  Did not intubate as patient respiratory status gradually became more stable on nasal cannula.    Assessment & Plan:   Principal Problem:   Septic shock ([place]) Active Problems:   Bacteremia  Septic shock E. coli bacteremia Lactic acidosis, resolved Continue CTX 10 days for E. coli bacteremia.  Leukocytosis improving, lactic acidosis resolved.  Improving and clinically stable to move out of ICU. - Continue CTX 2/20 - present.  Plan for FQ at discharge, cipro, she's tolerated this before.  Discussed FQ risk. - unclear source, in setting of UVJ stone and mild hydro, suspect UTI most likely source though urine culture negative - Discontinue stress dose hydrocortisone   AKI, unchanged Obstructive R UVJ stone and hydronephrosis Baseline appears to be <1 Improving, follow outpatient  UA with 6-10 RBC's, 30 mg/dl  protein Initial CT scan with R UVJ stone with mild right hydro Renal US 2/20 with normal appearance of kidneys without hydro  Repeat UA pending Will discuss with urology given persistent hydro - plan for outpatient urology f/u Strict I/O, daily weights  Acute hypoxic respiratory failure with hypercarbia Continues to be stable on nasal cannula, gradually weaning.  Atelectasis and recent illness likely limiting respiratory reserve. - SpO2 goal > 92%, wean as able - Encourage incentive spirometry hourly - Repeat outpatient sleep study, possible BiPAP need   Hypokinesis of septum on TTE Nonsustained run of Vtach EKG reassuring this morning, but concern given that patient had symptomatic Vtach in setting of TTE findings and persistently elevated troponin.  Concern for missed MI or other cardiac etiology. - echo with RWMA with inferoseptal and inferior hypokinesis (difficult images), grade 1 diastolic dysfunction - appreciate cardiology recs - no definite wall motion abnormality, troponin leak thought c/w septic shock, no concern for ACS.  NSVT artifact.   T2DM with hyperglycemia Hgb A1c 7.8 04/2022 on Jardiance and Trulicity at home as well as Novolog sliding scale (20-26 units) TID with meals and tresiba 40 units twice daily.  C - Hold home Jardiance and Trulicity in setting of acute illness - 25 U levemir BID and SSI - adjust as needed   HTN Metoprolol on hold   Thrombocytopenia Monitor, likely related to sepsis     DVT prophylaxis: heparin Code Status: full Family Communication: daughter at bedside Disposition:   Status is: Inpatient Remains inpatient appropriate because: need for continued inpatient workup   Consultants:  PCCM cards  Procedures:  Echo IMPRESSIONS  1. Left ventricular ejection fraction, by estimation, is 50 to 55%. The  left ventricle has low normal function. The left ventricle demonstrates  regional wall motion abnormalities with possible  inferoseptal and inferior  hypokinesis but images very  difficult even with Definity. Left ventricular diastolic parameters are  consistent with Grade I diastolic dysfunction (impaired relaxation).   2. Right ventricular systolic function is normal. The right ventricular  size is normal. Tricuspid regurgitation signal is inadequate for assessing  PA pressure.   3. The mitral valve was not well visualized. No evidence of mitral valve  regurgitation. No evidence of mitral stenosis.   4. The aortic valve is tricuspid. There is mild calcification of the  aortic valve. Aortic valve regurgitation is not visualized. No aortic  stenosis is present.   5. The inferior vena cava is normal in size with greater than 50%  respiratory variability, suggesting right atrial pressure of 3 mmHg.   6. Technically difficult study with very poor images.   Antimicrobials:  Anti-infectives (From admission, onward)    Start     Dose/Rate Route Frequency Ordered Stop   05/09/22 1300  cefTRIAXone (ROCEPHIN) 2 g in sodium chloride 0.9 % 100 mL IVPB        2 g 200 mL/hr over 30 Minutes Intravenous Every 24 hours 05/09/22 0914 05/19/22 0959   05/08/22 2300  linezolid (ZYVOX) IVPB 600 mg  Status:  Discontinued        600 mg 300 mL/hr over 60 Minutes Intravenous Every 12 hours 05/08/22 1807 05/09/22 0409   05/08/22 1900  aztreonam (AZACTAM) 1 g in sodium chloride 0.9 % 100 mL IVPB  Status:  Discontinued        1 g 200 mL/hr over 30 Minutes Intravenous Every 8 hours 05/08/22 1210 05/09/22 0914   05/08/22 1100  linezolid (ZYVOX) IVPB 600 mg        600 mg 300 mL/hr over 60 Minutes Intravenous Once 05/08/22 1050 05/08/22 1255   05/08/22 1030  aztreonam (AZACTAM) 2 g in sodium chloride 0.9 % 100 mL IVPB        2 g 200 mL/hr over 30 Minutes Intravenous  Once 05/08/22 1028 05/08/22 1156       Subjective: No complaints  Objective: Vitals:   05/11/22 0524 05/11/22 0700 05/11/22 0822 05/11/22 1151  BP: 129/71  131/61 133/72 127/68  Pulse:  80 84 79  Resp: '17 17 18 20  '$ Temp: 97.8 F (36.6 C) 97.8 F (36.6 C)  97.7 F (36.5 C)  TempSrc: Oral Oral  Oral  SpO2: 98% 99% 97% 99%  Weight:      Height:        Intake/Output Summary (Last 24 hours) at 05/11/2022 1436 Last data filed at 05/11/2022 [id number] Gross per 24 hour  Intake 33.83 ml  Output 1000 ml  Net -966.17 ml   Filed Weights   05/10/22 0600 05/11/22 0500  Weight: (!) 155.4 kg (!) 156 kg    Examination:  General: No acute distress. Cardiovascular: RRR Lungs: unlabored Abdomen: Soft, nontender, nondistended  Neurological: Alert and oriented 3. Moves all extremities 4 with equal strength. Cranial nerves II through XII grossly intact. Extremities: No clubbing or cyanosis. No edema.   Data Reviewed: I have personally reviewed following labs and imaging studies  CBC: Recent Labs  Lab 05/08/22 1026 05/08/22 1250 05/09/22 0256 05/10/22 0347  WBC 4.8  --  29.5* 19.8*  NEUTROABS 4.0  --   --  15.6*  HGB 15.1*  13.6 12.8 12.1  HCT 46.4* 40.0 37.8 36.0  MCV 91.7  --  88.7 89.6  PLT 108*  --  90* 76*    Basic Metabolic Panel: Recent Labs  Lab 05/08/22 1026 05/08/22 1250 05/09/22 0256 05/10/22 0347  NA 136 135 139 137  K 3.7 3.5 3.5 3.9  CL 100  --  105 102  CO2 20*  --  22 23  GLUCOSE 269*  --  168* 208*  BUN 41*  --  42* 57*  CREATININE 2.83*  --  2.53* 2.56*  CALCIUM 8.4*  --  8.0* 7.9*  MG  --   --  1.8 2.4  PHOS  --   --  2.2* 4.3    GFR: Estimated Creatinine Clearance: 37.8 mL/min (Shamina Etheridge) (by C-G formula based on SCr of 2.56 mg/dL (H)).  Liver Function Tests: Recent Labs  Lab 05/08/22 1026 05/09/22 0823 05/10/22 0347  AST 39 51* 45*  ALT 34 30 29  ALKPHOS 901* 192* 142*  BILITOT 1.7* 0.7 0.5  PROT 6.2* 5.6* 5.2*  ALBUMIN 2.6* 1.9* 1.8*    CBG: Recent Labs  Lab 05/10/22 1151 05/10/22 1623 05/10/22 2147 05/11/22 0800 05/11/22 1148  GLUCAP 222* 210* 155* 71 123*     Recent Results (from the  past 240 hour(s))  Blood Culture (routine x 2)     Status: Abnormal   Collection Time: 05/08/22 10:15 AM   Specimen: BLOOD  Result Value Ref Range Status   Specimen Description BLOOD LEFT ANTECUBITAL  Final   Special Requests   Final    BOTTLES DRAWN AEROBIC AND ANAEROBIC Blood Culture adequate volume   Culture  Setup Time   Final    GRAM NEGATIVE RODS IN BOTH AEROBIC AND ANAEROBIC BOTTLES CRITICAL RESULT CALLED TO, READ BACK BY AND VERIFIED WITH: J WYLAND,PHARMD'@0330'$  05/09/22 [place] Performed at [facility], [place] [address], [place], [place]    Culture ESCHERICHIA COLI (Emili Mcloughlin)  Final   Report Status 05/10/2022 FINAL  Final   Organism ID, Bacteria ESCHERICHIA COLI  Final      Susceptibility   Escherichia coli - MIC*    AMPICILLIN >=32 RESISTANT Resistant     CEFEPIME <=0.12 SENSITIVE Sensitive     CEFTAZIDIME <=1 SENSITIVE Sensitive     CEFTRIAXONE <=0.25 SENSITIVE Sensitive     CIPROFLOXACIN <=0.25 SENSITIVE Sensitive     GENTAMICIN >=16 RESISTANT Resistant     IMIPENEM <=0.25 SENSITIVE Sensitive     TRIMETH/SULFA >=320 RESISTANT Resistant     AMPICILLIN/SULBACTAM 16 INTERMEDIATE Intermediate     PIP/TAZO <=4 SENSITIVE Sensitive     * ESCHERICHIA COLI  Blood Culture ID Panel (Reflexed)     Status: Abnormal   Collection Time: 05/08/22 10:15 AM  Result Value Ref Range Status   Enterococcus faecalis NOT DETECTED NOT DETECTED Final   Enterococcus Faecium NOT DETECTED NOT DETECTED Final   Listeria monocytogenes NOT DETECTED NOT DETECTED Final   Staphylococcus species NOT DETECTED NOT DETECTED Final   Staphylococcus aureus (BCID) NOT DETECTED NOT DETECTED Final   Staphylococcus epidermidis NOT DETECTED NOT DETECTED Final   Staphylococcus lugdunensis NOT DETECTED NOT DETECTED Final   Streptococcus species NOT DETECTED NOT DETECTED Final   Streptococcus agalactiae NOT DETECTED NOT DETECTED Final   Streptococcus pneumoniae NOT DETECTED NOT DETECTED Final   Streptococcus  pyogenes NOT DETECTED NOT DETECTED Final   Angelo Caroll.calcoaceticus-baumannii NOT DETECTED NOT DETECTED Final   Bacteroides fragilis NOT DETECTED NOT DETECTED Final   Enterobacterales DETECTED (  Waqas Bruhl) NOT DETECTED Final    Comment: Enterobacterales represent Melo Stauber large order of gram negative bacteria, not Jason Hauge single organism. CRITICAL RESULT CALLED TO, READ BACK BY AND VERIFIED WITH: J WYLAND,PHARMD'@0330'$  05/09/22 [place]    Enterobacter cloacae complex NOT DETECTED NOT DETECTED Final   Escherichia coli DETECTED (Hanh Kertesz) NOT DETECTED Final    Comment: CRITICAL RESULT CALLED TO, READ BACK BY AND VERIFIED WITH: J WYLAND,PHARMD'@0330'$  05/09/22 [place]    Klebsiella aerogenes NOT DETECTED NOT DETECTED Final   Klebsiella oxytoca NOT DETECTED NOT DETECTED Final   Klebsiella pneumoniae NOT DETECTED NOT DETECTED Final   Proteus species NOT DETECTED NOT DETECTED Final   Salmonella species NOT DETECTED NOT DETECTED Final   Serratia marcescens NOT DETECTED NOT DETECTED Final   Haemophilus influenzae NOT DETECTED NOT DETECTED Final   Neisseria meningitidis NOT DETECTED NOT DETECTED Final   Pseudomonas aeruginosa NOT DETECTED NOT DETECTED Final   Stenotrophomonas maltophilia NOT DETECTED NOT DETECTED Final   Candida albicans NOT DETECTED NOT DETECTED Final   Candida auris NOT DETECTED NOT DETECTED Final   Candida glabrata NOT DETECTED NOT DETECTED Final   Candida krusei NOT DETECTED NOT DETECTED Final   Candida parapsilosis NOT DETECTED NOT DETECTED Final   Candida tropicalis NOT DETECTED NOT DETECTED Final   Cryptococcus neoformans/gattii NOT DETECTED NOT DETECTED Final   CTX-M ESBL NOT DETECTED NOT DETECTED Final   Carbapenem resistance IMP NOT DETECTED NOT DETECTED Final   Carbapenem resistance KPC NOT DETECTED NOT DETECTED Final   Carbapenem resistance NDM NOT DETECTED NOT DETECTED Final   Carbapenem resist OXA 48 LIKE NOT DETECTED NOT DETECTED Final   Carbapenem resistance VIM NOT DETECTED NOT DETECTED Final    Comment:  Performed at [facility], [address], [place],  [place]  Resp panel by RT-PCR (RSV, Flu Camie Hauss&B, Covid) Anterior Nasal Swab     Status: None   Collection Time: 05/08/22 10:26 AM   Specimen: Anterior Nasal Swab  Result Value Ref Range Status   SARS Coronavirus 2 by RT PCR NEGATIVE NEGATIVE Final   Influenza Yoskar Murrillo by PCR NEGATIVE NEGATIVE Final   Influenza B by PCR NEGATIVE NEGATIVE Final    Comment: (NOTE) The Xpert Xpress SARS-CoV-2/FLU/RSV plus assay is intended as an aid in the diagnosis of influenza from Nasopharyngeal swab specimens and should not be used as Mike Berntsen sole basis for treatment. Nasal washings and aspirates are unacceptable for Xpert Xpress SARS-CoV-2/FLU/RSV testing.  Fact Sheet for Patients: 05/21/22  Fact Sheet for Healthcare Providers: EntrepreneurPulse.com.au  This test is not yet approved or cleared by the [employer]ible[employer] FDA and has been authorized for detection and/or diagnosis of SARS-CoV-2 by FDA under an Emergency Use Authorization (EUA). This EUA will remain in effect (meaning this test can be used) for the duration of the COVID-19 declaration under Section 564(b)(1) of the Act, 21 U.S.C. section 360bbb-3(b)(1), unless the authorization is terminated or revoked.     Resp Syncytial Virus by PCR NEGATIVE NEGATIVE Final    Comment: (NOTE) Fact Sheet for Patients: [place]  Fact Sheet for Healthcare Providers: EntrepreneurPulse.com.au  This test is not yet approved or cleared by the [employer]ibleEmployment.be FDA and has been authorized for detection and/or diagnosis of SARS-CoV-2 by FDA under an Emergency Use Authorization (EUA). This EUA will remain in effect (meaning this test can be used) for the duration of the COVID-19 declaration under Section 564(b)(1) of the Act, 21 U.S.C. section 360bbb-3(b)(1), unless the authorization is terminated  or revoked.  Performed  at [facility], [place] [address] [place], [place] [address]   Blood Culture (routine x 2)     Status: Abnormal   Collection Time: 05/08/22 10:30 AM   Specimen: BLOOD  Result Value Ref Range Status   Specimen Description BLOOD RIGHT ANTECUBITAL  Final   Special Requests   Final    BOTTLES DRAWN AEROBIC AND ANAEROBIC Blood Culture adequate volume   Culture  Setup Time   Final    GRAM NEGATIVE RODS IN BOTH AEROBIC AND ANAEROBIC BOTTLES CRITICAL VALUE NOTED.  VALUE IS CONSISTENT WITH PREVIOUSLY REPORTED AND CALLED VALUE.    Culture (Hensley Aziz)  Final    ESCHERICHIA COLI SUSCEPTIBILITIES PERFORMED ON PREVIOUS CULTURE WITHIN THE LAST 5 DAYS. Performed at [facility], [place] [address], [place], [place] [address]    Report Status 05/11/2022 FINAL  Final  Urine Culture     Status: Abnormal   Collection Time: 05/09/22  2:58 AM   Specimen: Urine, Catheterized  Result Value Ref Range Status   Specimen Description URINE, CATHETERIZED  Final   Special Requests NONE Reflexed from [id number]  Final   Culture (Martika Egler)  Final    <10,000 COLONIES/mL INSIGNIFICANT GROWTH Performed at [facility], [place] [address] [place], [place] [address]    Report Status 05/10/2022 FINAL  Final         Radiology Studies: No results found.      Scheduled Meds:  gabapentin  200 mg Oral BID   heparin  5,000 Units Subcutaneous Q8H   insulin aspart  0-20 Units Subcutaneous TID WC   insulin aspart  0-5 Units Subcutaneous QHS   insulin detemir  25 Units Subcutaneous BID   sodium chloride flush  10-40 mL Intracatheter Q12H   Continuous Infusions:  cefTRIAXone (ROCEPHIN)  IV 200 mL/hr at 05/11/22 0917     LOS: 3 days    Time spent: over 30 min    Fayrene Helper, MD [facility]   To contact the attending provider between 7A-7P or the covering provider during after hours 7P-7A, please log into the web site [email or web address] and access using universal [place]  password for that web site. If you do not have the password, please call the hospital operator.  05/11/2022, 2:36 PM

## 2022-05-12 NOTE — TOC Initial Note (Addendum)
Transition of Care [facility]) - Initial/Assessment Note    Patient Details  Name: [name] MRN: [id number] Date of Birth: Jan 17, 1966  Transition of Care [facility]) CM/SW Contact:    Bethena Roys, RN Phone Number: 05/12/2022, 2:18 PM  Clinical Narrative: Patient presented for hypotension and fever. PTA patient was from home with spouse and she has support of daughter. Patient has DME rolling walker, bedside commode, lift chair, and electronic wheelchair. Case Manager discussed home health services and  the patient reviewed the Medicare.gov list. Patient is agreeable to [facility]- referral made and start of care to begin within 24-48 hours post transition home. MD to write order for [facility] PT with F2F. No further needs identified at this time.                 Expected Discharge Plan: [place] Barriers to Discharge: No Barriers Identified   Patient Goals and CMS Choice Patient states their goals for this hospitalization and ongoing recovery are:: to return home. CMS Medicare.gov Compare Post Acute Care list provided to:: Patient Choice offered to / list presented to : Patient, Adult Children  Expected Discharge Plan and Services In-house Referral: NA Discharge Planning Services: CM Consult Post Acute Care Choice: [place] arrangements for the past 2 months: Single Family Home                   DME Agency: NA       HH Arranged: PT HH Agency: [place] Date [place]: 05/12/22 Time [place]: 91 Representative spoke with at [place]: Tommi Rumps  Prior Living Arrangements/Services Living arrangements for the past 2 months: [place] with:: Spouse, Adult Children Patient language and need for interpreter reviewed:: Yes Do you feel safe going back to the place where you live?: Yes      Need for Family Participation in Patient Care: Yes (Comment) Care giver support system in place?: Yes (comment)    Criminal Activity/Legal Involvement Pertinent to Current Situation/Hospitalization: No - Comment as needed  Activities of Daily Living Home Assistive Devices/Equipment: Walker (specify type) ADL Screening (condition at time of admission) Patient's cognitive ability adequate to safely complete daily activities?: Yes Is the patient deaf or have difficulty hearing?: No Does the patient have difficulty seeing, even when wearing glasses/contacts?: No Does the patient have difficulty concentrating, remembering, or making decisions?: No Patient able to express need for assistance with ADLs?: Yes Does the patient have difficulty dressing or bathing?: Yes Independently performs ADLs?: No Communication: Independent Dressing (OT): Needs assistance Is this a change from baseline?: Change from baseline, expected to last <3days Grooming: Independent Feeding: Independent Bathing: Needs assistance Is this a change from baseline?: Change from baseline, expected to last <3 days Toileting: Needs assistance Is this a change from baseline?: Change from baseline, expected to last <3 days In/Out Bed: Needs assistance Is this a change from baseline?: Change from baseline, expected to last <3 days Walks in Home: Needs assistance Is this a change from baseline?: Change from baseline, expected to last <3 days Does the patient have difficulty walking or climbing stairs?: Yes Weakness of Legs: Both Weakness of Arms/Hands: Both  Permission Sought/Granted Permission sought to share information with : Family Supports, Customer service manager, Case Optician, dispensing granted to share information with : Yes, Verbal Permission Granted     Permission granted to share info w AGENCY: [facility]        Emotional Assessment  Appearance:: Appears stated age Attitude/Demeanor/Rapport: Engaged Affect (typically observed): Appropriate Orientation: : Oriented to Situation, Oriented to  Time, Oriented to Place,  Oriented to Self Alcohol / Substance Use: Not Applicable Psych Involvement: No (comment)  Admission diagnosis:  Septic shock ([place]) [A41.9, R65.21] Patient Active Problem List   Diagnosis Date Noted   Bacteremia 05/09/2022   Septic shock ([place]) 05/08/2022   Blister 10/24/2021   Cystitis with hematuria 10/24/2021   Urinary frequency 10/24/2021   Scab 10/24/2021   Dysuria 06/14/2021   Sore on toe 05/17/2021   Nasal mass 01/30/2019   Vitamin D deficiency 10/29/2018   Migraine without status migrainosus, not intractable    Chronic pain syndrome    Lumbar disc disease with radiculopathy 07/21/2017   Lumbar foraminal stenosis 07/21/2017   Unable to walk 07/21/2017   Multiple falls 07/21/2017   Lower extremity weakness 05/28/2017   Abnormal TSH 01/10/2016   Morbid obesity ([place]) 01/12/2014   Routine general medical examination at a health care facility 10/28/2012   GERD (gastroesophageal reflux disease) 10/28/2012   Anxiety 12/20/2011   Allergic rhinitis 12/20/2011   Asthma 12/20/2011   Lapband APL with HH repair 03/31/2011   Type 2 diabetes mellitus with diabetic neuropathy ([place]) 06/30/2007   Hyperlipidemia 06/30/2007   Cough 06/30/2007   Essential hypertension 06/27/2007   PCP:  Pleas Koch, NP Pharmacy:   [place], [place] [address] Phone: [phone] Fax: [phone]  [id number] [id number]: [phone]: [phone] Social Determinants of Health (SDOH) Social History: SDOH Screenings   Food Insecurity: No Food Insecurity (05/11/2022)  Housing: Low Risk  (05/11/2022)  Transportation Needs: No Transportation Needs (05/11/2022)  Utilities: Not At Risk (05/11/2022)  Depression (PHQ2-9): Low Risk  (09/30/2021)  Financial Resource Strain: Low Risk  (09/30/2021)  Physical Activity: Inactive (09/30/2021)  Stress:  No Stress Concern Present (09/30/2021)  Tobacco Use: Low Risk  (05/11/2022)   Readmission Risk Interventions     No data to display

## 2022-05-12 NOTE — Evaluation (Signed)
Physical Therapy Evaluation Patient Details Name: [name] MRN: [id number] DOB: [birth year] Today's Date: 05/12/2022  History of Present Illness  Patient is a 57 year old female admitted 2/19   presented to [facility] emergency department with complaints of URI symptoms and malaise x 6 days now complicated by nausea and vomiting x 24 hours. Found to have septic shock. PMH:   diabetes, asthma, hypertension, MRSA infection, GERD, thyroid disease, and sleep apnea (long ago).  Clinical Impression  Pt admitted with above diagnosis. Pt was able to ambulate with RW with min guard assist with +2 assist for safety. Daughter states pt is close to baseline and they are comfortable with pt returning home. Pt should progress well. HR 94-135 bpm with activity. Other VSS.  Will follow acutely.  Pt currently with functional limitations due to the deficits listed below (see PT Problem List). Pt will benefit from skilled PT to increase their independence and safety with mobility to allow discharge to the venue listed below.          Recommendations for follow up therapy are one component of a multi-disciplinary discharge planning process, led by the attending physician.  Recommendations may be updated based on patient status, additional functional criteria and insurance authorization.  Follow Up Recommendations Home health PT      Assistance Recommended at Discharge Intermittent Supervision/Assistance  Patient can return home with the following  A little help with walking and/or transfers;A little help with bathing/dressing/bathroom;Assistance with cooking/housework;Direct supervision/assist for financial management;Assist for transportation;Help with stairs or ramp for entrance    Equipment Recommendations None recommended by PT  Recommendations for Other Services       Functional Status Assessment Patient has had a recent decline in their functional status and demonstrates the ability to make significant  improvements in function in a reasonable and predictable amount of time.     Precautions / Restrictions Precautions Precautions: Fall Restrictions Weight Bearing Restrictions: No      Mobility  Bed Mobility Overal bed mobility: Needs Assistance Bed Mobility: Supine to Sit, Sit to Supine     Supine to sit: Min assist Sit to supine: Min assist   General bed mobility comments: Needs a little assist for LEs for getting in and out of bed.    Transfers Overall transfer level: Needs assistance Equipment used: Rolling walker (2 wheels) Transfers: Sit to/from Stand Sit to Stand: Min assist, +2 safety/equipment           General transfer comment: Needs a little assist to rise especially from lower surfaces as well as she must have rail to push up on (has house arranged accordingly. Pt needs cues for hand placement and needs right LE blocked so it doesnt slide with sit to stand.    Ambulation/Gait Ambulation/Gait assistance: Min guard, Supervision, +2 safety/equipment Gait Distance (Feet): 10 Feet (5 x 2) Assistive device: Rolling walker (2 wheels) Gait Pattern/deviations: Step-through pattern, Decreased stride length, Decreased step length - right, Decreased step length - left, Decreased dorsiflexion - right   Gait velocity interpretation: <1.31 ft/sec, indicative of household ambulator   General Gait Details: Pt brought her RW from home.  Her RW is somewhat tall for her height however she uses it well.  Pt was able to walk a few steps to the 3n1 safely from the bed with good posture and good stepping. Daughter states pt is close to her baseline. Pt able to then walk back to the bed after she was done.  Stairs  Wheelchair Mobility    Modified Rankin (Stroke Patients Only)       Balance Overall balance assessment: Needs assistance Sitting-balance support: No upper extremity supported, Feet supported Sitting balance-Leahy Scale: Fair     Standing balance  support: Bilateral upper extremity supported, During functional activity Standing balance-Leahy Scale: Poor Standing balance comment: relies on A device and guard assist for safety                             Pertinent Vitals/Pain Pain Assessment Pain Assessment: No/denies pain    Home Living Family/patient expects to be discharged to:: Private residence Living Arrangements: Spouse/significant other;Children (adult daughter) Available Help at Discharge: Family;Available 24 hours/day (husband is able to be there 24/7) Type of Home: House Home Access: Ramped entrance       Home Layout: One level Home Equipment: Shower seat - built in;Grab bars - tub/shower;Hand held shower head;Toilet riser;BSC/3in1;Rolling Environmental consultant (2 wheels);Wheelchair - Building services engineer;Other (comment);Adaptive equipment (adjustable bed frame) Additional Comments: Manages medications and finances    Prior Function Prior Level of Function : Needs assist       Physical Assist : Mobility (physical);ADLs (physical) Mobility (physical): Transfers ADLs (physical): Dressing Mobility Comments: Reports limited functional use of RLE ~5 yrs. Neuropathy present due to nerve compression from previous back surgery. Able to walk very short distance at baseline ~50 feet. ADLs Comments: Needed assist to don socks and to get pants and underwear started once started she is able to pull them up - in bed; if she is going to stand to pull them up, she will need someone to hold them so they don't fall down.     Hand Dominance   Dominant Hand: Right    Extremity/Trunk Assessment   Upper Extremity Assessment Upper Extremity Assessment: Defer to OT evaluation    Lower Extremity Assessment RLE Deficits / Details: grossly 3-/5 RLE Sensation: decreased light touch;history of peripheral neuropathy    Cervical / Trunk Assessment Cervical / Trunk Assessment: Kyphotic  Communication   Communication: No difficulties   Cognition Arousal/Alertness: Awake/alert Behavior During Therapy: WFL for tasks assessed/performed Overall Cognitive Status: Within Functional Limits for tasks assessed                                          General Comments General comments (skin integrity, edema, etc.): 129/64, 92 HR, HR to 135 bpm with activity and took incr time to recover.    Exercises     Assessment/Plan    PT Assessment Patient needs continued PT services  PT Problem List Decreased activity tolerance;Decreased balance;Decreased mobility;Decreased strength;Decreased knowledge of use of DME;Decreased safety awareness;Decreased knowledge of precautions;Cardiopulmonary status limiting activity       PT Treatment Interventions Gait training;DME instruction;Functional mobility training;Therapeutic activities;Therapeutic exercise;Balance training;Patient/family education    PT Goals (Current goals can be found in the Care Plan section)  Acute Rehab PT Goals Patient Stated Goal: to go home PT Goal Formulation: With patient Time For Goal Achievement: 05/26/22 Potential to Achieve Goals: Good    Frequency Min 3X/week     Co-evaluation PT/OT/SLP Co-Evaluation/Treatment: Yes Reason for Co-Treatment: To address functional/ADL transfers;Complexity of the patient's impairments (multi-system involvement);For patient/therapist safety PT goals addressed during session: Mobility/safety with mobility         AM-PAC PT "6 Clicks" Mobility  Outcome Measure Help needed turning  from your back to your side while in a flat bed without using bedrails?: A Little Help needed moving from lying on your back to sitting on the side of a flat bed without using bedrails?: A Little Help needed moving to and from a bed to a chair (including a wheelchair)?: A Little Help needed standing up from a chair using your arms (e.g., wheelchair or bedside chair)?: A Little Help needed to walk in hospital room?: A  Little Help needed climbing 3-5 steps with a railing? : A Lot 6 Click Score: 17    End of Session Equipment Utilized During Treatment: Gait belt Activity Tolerance: Patient limited by fatigue Patient left: in bed;with call bell/phone within reach;with bed alarm set;with family/visitor present Nurse Communication: Mobility status PT Visit Diagnosis: Muscle weakness (generalized) (M62.81)    Time: [id number] PT Time Calculation (min) (ACUTE ONLY): 34 min   Charges:   PT Evaluation $PT Eval Moderate Complexity: 1 Mod PT Treatments $Gait Training: 8-22 mins        [facility] M,PT Acute Rehab Services [place] 05/12/2022, 1:15 PM

## 2022-05-12 NOTE — Progress Notes (Addendum)
Mobility Specialist Progress Note:   05/12/22 1140  Mobility  Activity Transferred to/from [facility]Transferred from chair to bed  Level of Assistance Minimal assist, patient does 75% or more  Assistive Device Front wheel walker (Bariatric)  Distance Ambulated (ft) 8 ft  Activity Response Tolerated well  Mobility Referral Yes  $Mobility charge 1 Mobility   Pt requesting to transfer to [facility] for BM. Required minA to stand from low recliner and BSC, with heavy reliance on BUE. R foot blocked while standing which is baseline. Pt and daughter agree pt is close to baseline. Required maxA to assist legs back into bed. Left with all needs met, bed alarm on.   Nelta Numbers Mobility Specialist Please contact via SecureChat or  Rehab office at [phone]

## 2022-05-13 DIAGNOSIS — A419 Sepsis, unspecified organism: Secondary | ICD-10-CM | POA: Diagnosis not present

## 2022-05-13 DIAGNOSIS — R6521 Severe sepsis with septic shock: Secondary | ICD-10-CM | POA: Diagnosis not present

## 2022-05-13 LAB — CBC
HCT: 39.6 % (ref 36.0–46.0)
Hemoglobin: 13.2 g/dL (ref 12.0–15.0)
MCH: 29.4 pg (ref 26.0–34.0)
MCHC: 33.3 g/dL (ref 30.0–36.0)
MCV: 88.2 fL (ref 80.0–100.0)
Platelets: 133 10*3/uL — ABNORMAL LOW (ref 150–400)
RBC: 4.49 MIL/uL (ref 3.87–5.11)
RDW: 15.6 % — ABNORMAL HIGH (ref 11.5–15.5)
WBC: 11.3 10*3/uL — ABNORMAL HIGH (ref 4.0–10.5)
nRBC: 0.4 % — ABNORMAL HIGH (ref 0.0–0.2)

## 2022-05-13 LAB — BASIC METABOLIC PANEL
Anion gap: 10 (ref 5–15)
BUN: 48 mg/dL — ABNORMAL HIGH (ref 6–20)
CO2: 22 mmol/L (ref 22–32)
Calcium: 8.1 mg/dL — ABNORMAL LOW (ref 8.9–10.3)
Chloride: 106 mmol/L (ref 98–111)
Creatinine, Ser: 1.56 mg/dL — ABNORMAL HIGH (ref 0.44–1.00)
GFR, Estimated: 39 mL/min — ABNORMAL LOW (ref >60)
Glucose, Bld: 176 mg/dL — ABNORMAL HIGH (ref 70–99)
Potassium: 3.9 mmol/L (ref 3.5–5.1)
Sodium: 138 mmol/L (ref 135–145)

## 2022-05-13 LAB — PHOSPHORUS: Phosphorus: 3.2 mg/dL (ref 2.5–4.6)

## 2022-05-13 LAB — GLUCOSE, CAPILLARY
Glucose-Capillary: 160 mg/dL — ABNORMAL HIGH (ref 70–99)
Glucose-Capillary: 152 mg/dL — ABNORMAL HIGH (ref 70–99)
Glucose-Capillary: 155 mg/dL — ABNORMAL HIGH (ref 70–99)
Glucose-Capillary: 207 mg/dL — ABNORMAL HIGH (ref 70–99)

## 2022-05-13 LAB — MAGNESIUM: Magnesium: 2.1 mg/dL (ref 1.7–2.4)

## 2022-05-13 MED ORDER — CIPROFLOXACIN HCL 500 MG PO TABS
500.0000 mg | ORAL_TABLET | ORAL | Status: AC
  Administered 2022-05-13: 500 mg via ORAL
  Filled 2022-05-13: qty 1

## 2022-05-13 MED ORDER — LOSARTAN POTASSIUM 50 MG PO TABS: 50.0000 mg | ORAL_TABLET | Freq: Every day | ORAL | 2 refills | Status: DC

## 2022-05-13 MED ORDER — LEVOCETIRIZINE DIHYDROCHLORIDE 5 MG PO TABS: 5.0000 mg | ORAL_TABLET | Freq: Every evening | ORAL | Status: DC

## 2022-05-13 MED ORDER — DIPHENHYDRAMINE HCL 25 MG PO CAPS
25.0000 mg | ORAL_CAPSULE | Freq: Four times a day (QID) | ORAL | Status: DC | PRN
  Administered 2022-05-13: 25 mg via ORAL
  Administered 2022-05-13: 50 mg via ORAL
  Filled 2022-05-13: qty 1
  Filled 2022-05-13: qty 2

## 2022-05-13 MED ORDER — CIPROFLOXACIN HCL 500 MG PO TABS: 500.0000 mg | ORAL_TABLET | Freq: Two times a day (BID) | ORAL | 0 refills | Status: DC

## 2022-05-13 MED ORDER — GABAPENTIN 300 MG PO CAPS: 300.0000 mg | ORAL_CAPSULE | Freq: Two times a day (BID) | ORAL | 0 refills | Status: DC

## 2022-05-13 MED ORDER — HYDROCHLOROTHIAZIDE 12.5 MG PO TABS: 12.5000 mg | ORAL_TABLET | Freq: Every day | ORAL | 3 refills | Status: DC

## 2022-05-13 MED ORDER — SODIUM CHLORIDE 0.9 % IV SOLN
2.0000 g | Freq: Once | INTRAVENOUS | Status: AC
  Administered 2022-05-14: 2 g via INTRAVENOUS
  Filled 2022-05-13: qty 20

## 2022-05-13 MED ORDER — EPINEPHRINE 0.3 MG/0.3ML IJ SOAJ: 0.3000 mg | Freq: Once | INTRAMUSCULAR | Status: DC | PRN

## 2022-05-13 MED ORDER — LORATADINE 10 MG PO TABS
10.0000 mg | ORAL_TABLET | Freq: Every day | ORAL | Status: DC
  Administered 2022-05-13: 10 mg via ORAL
  Filled 2022-05-13: qty 1

## 2022-05-13 NOTE — Discharge Summary (Signed)
Physician Discharge Summary  [name] [id number] DOB: 1965-09-04 DOA: 05/08/2022  PCP: Pleas Koch, NP  Admit date: 05/08/2022 Discharge date: 05/14/2022  Time spent: 40 minutes  Recommendations for Outpatient Follow-up:  Follow outpatient CBC/CMP  Follow with cards outpatient for abnormal echo Consider urology follow up  Follow blood pressure outpatient Follow BG outpatient Needs sleep study outpatient, need for bipap? Referral to allergy  Jardiance d/c'd due to hx of frequent UTIs   Discharge Diagnoses:  Principal Problem:   Septic shock ([place]) Active Problems:   Bacteremia   Discharge Condition: stable  Diet recommendation: heart healthy, diabetic  Filed Weights   05/12/22 0326 05/13/22 0405 05/14/22 0500  Weight: (!) 156.8 kg (!) 152.9 kg (!) 150.7 kg    History of present illness:   Patient is [name] 57 year old female with past medical history as below, which is significant for diabetes, asthma, hypertension, MRSA infection, GERD, thyroid disease, and sleep apnea (long ago).  She presented to [facility] emergency department on 2/19 with complaints of URI symptoms and malaise x 6 days now complicated by nausea and vomiting x 24 hours.  1 day prior to arrival, she was started on doxycycline by PCP.  Upon arrival to the emergency department she was noted to be febrile and hypotensive.  With concern for sepsis she was started immediately on empiric antibiotics and provided with IVF resuscitation.  Lactic acid elevated at 7.  Despite IVF resuscitation, she remained hypotensive and was started on norepinephrine infusion. Echocardiogram in the ED did not demonstrate any significant cardiac dysfunction, but was volume down. PCCM admitted to ICU and continued volume resuscitation.  Did not intubate as patient respiratory status gradually became more stable on nasal cannula.   She was treated for e. Coli bacteremia and septic shock.  Improved with 7 days abx.  Stable at  discharge.  See below for additional details.  Hospital Course:  Assessment and Plan:  Septic shock E. coli bacteremia Lactic acidosis, resolved - ceftriaxone 2/20 - 25.  Completed 7 days of abx with aztreonam.  No additional abx at discharge.  Cipro allergy, added to allergy list, d/c with epi pen, allergy referral.   - unclear source, in setting of UVJ stone and mild hydro, suspect UTI most likely source though urine culture negative - Discontinue stress dose hydrocortisone  AKI, unchanged Obstructive R UVJ stone and hydronephrosis Baseline appears to be <1 Improving, follow outpatient  UA with 6-10 RBC's, 30 mg/dl protein Initial CT scan with R UVJ stone with mild right hydro Renal US 2/20 with normal appearance of kidneys without hydro  UA with 0-5 RBC, negative protein on 2/23 Consider urology f/u outpatient  Strict I/O, daily weights   Acute hypoxic respiratory failure with hypercarbia Continues to be stable on nasal cannula, gradually weaning.  Atelectasis and recent illness likely limiting respiratory reserve. - SpO2 goal > 92%, wean as able - Encourage incentive spirometry hourly - Repeat outpatient sleep study, possible BiPAP need (discussed with pt)   Hypokinesis of septum on TTE Nonsustained run of Vtach EKG reassuring this morning, but concern given that patient had symptomatic Vtach in setting of TTE findings and persistently elevated troponin.  Concern for missed MI or other cardiac etiology. - echo with RWMA with inferoseptal and inferior hypokinesis (difficult images), grade 1 diastolic dysfunction - appreciate cardiology recs - no definite wall motion abnormality, troponin leak thought c/w septic shock, no concern for ACS.  NSVT artifact. - cards referral for outpatient follow up  T2DM with hyperglycemia Hgb A1c 7.8 04/2022 on Jardiance and Trulicity at home as well as Novolog sliding scale (20-26 units) TID with meals and tresiba 40 units twice daily.    Stop jardiance at d/c due to recurrent UTI's Resume home meds otherwise   HTN Metoprolol Resume HCTZ and losartan after d/c   Thrombocytopenia Monitor, likely related to sepsis Follow outpatient      Procedures:  Echo IMPRESSIONS     1. Left ventricular ejection fraction, by estimation, is 50 to 55%. The  left ventricle has low normal function. The left ventricle demonstrates  regional wall motion abnormalities with possible inferoseptal and inferior  hypokinesis but images very  difficult even with Definity. Left ventricular diastolic parameters are  consistent with Grade I diastolic dysfunction (impaired relaxation).   2. Right ventricular systolic function is normal. The right ventricular  size is normal. Tricuspid regurgitation signal is inadequate for assessing  PA pressure.   3. The mitral valve was not well visualized. No evidence of mitral valve  regurgitation. No evidence of mitral stenosis.   4. The aortic valve is tricuspid. There is mild calcification of the  aortic valve. Aortic valve regurgitation is not visualized. No aortic  stenosis is present.   5. The inferior vena cava is normal in size with greater than 50%  respiratory variability, suggesting right atrial pressure of 3 mmHg.   6. Technically difficult study with very poor images.    Consultations: PCCM  Discharge Exam: Vitals:   05/14/22 0500 05/14/22 0826  BP: (!) 151/63 137/74  Pulse: 88 83  Resp: 18 17  Temp: 97.9 F (36.6 C)   SpO2: 95% 98%   No complaints - itching overnight  Husband at bedside  General: No acute distress. Lungs: unlabored Neurological: Alert and oriented 3. Moves all extremities 4 with equal strength. Cranial nerves II through XII grossly intact. Extremities: No clubbing or cyanosis. No edema.   Discharge Instructions   Discharge Instructions     Ambulatory referral to Allergy   Complete by: As directed    Ambulatory referral to Cardiology   Complete by:  As directed    Call MD for:  difficulty breathing, headache or visual disturbances   Complete by: As directed    Call MD for:  extreme fatigue   Complete by: As directed    Call MD for:  hives   Complete by: As directed    Call MD for:  persistant dizziness or light-headedness   Complete by: As directed    Call MD for:  persistant nausea and vomiting   Complete by: As directed    Call MD for:  redness, tenderness, or signs of infection (pain, swelling, redness, odor or green/yellow discharge around incision site)   Complete by: As directed    Call MD for:  severe uncontrolled pain   Complete by: As directed    Call MD for:  temperature >100.4   Complete by: As directed    Diet - low sodium heart healthy   Complete by: As directed    Discharge instructions   Complete by: As directed    You were seen for sepsis from e. Coli in your blood.  This was most likely from Kristine Chahal urine infection (though the urine culture did not grow anything).  You had Aquil Duhe stone and mild blockage, that looks like it's now resolved in your urinary tract which predisposed you to this infection.  Vania Rea can also put you at increased risk of UTI's,  I recommend stopping this for now, you can discuss further with your PCP outpatient.  You completed 7 days of antibiotics here.  We gave you Brilyn Tuller dose of cipro, but based on your response and history, it sounds like you're allergic.  I'll send you with an epipen.  Return for concerning symptoms of allergic reactions.  Don't pick this up from the pharmacy and avoid flouroquinolones in the future.  Resume your losartan and HCTZ Monday.  Stop your jardiance.  Lower your gabapentin dose until you get follow up labs.  You can discuss with your PCP whether you need follow up with the kidney doctors or urologist.  Your kidney function looks close to baseline, I doubt you'll need follow up with the kidney doctor.  You can discuss whether to see the urologist due to your kidney stone and  temporary blockage.  Follow with cardiology outpatient for your abnormal echo.   Watch your blood sugars as you return home.  We had you on Jelicia Nantz reduced insulin dose here.  Watch for lows as you're recovering from your acute illness.  Stop the jardiance as noted above.  Return for new, recurrent, or worsening symptoms.  Please ask your PCP to request records from this hospitalization so they know what was done and what the next steps will be.   Increase activity slowly   Complete by: As directed       Allergies as of 05/14/2022       Reactions   Amoxicillin Shortness Of Breath, Rash   Patient states she was previously able to tolerate Keflex without issue    Bee Venom Anaphylaxis   Levofloxacin Shortness Of Breath, Rash   Meloxicam Hives, Rash   Vancomycin Anaphylaxis   Sulfa Antibiotics Rash   Ace Inhibitors Cough   Ciprofloxacin Hives   Codeine    Meperidine Hcl    Oxycodone-acetaminophen    Tyloxapol    Other reaction(s): vomiting/rash   Penicillins Swelling, Rash   Has patient had Brigida Scotti PCN reaction causing immediate rash, facial/tongue/throat swelling, SOB or lightheadedness with hypotension: Yes Has patient had Bennye Nix PCN reaction causing severe rash involving mucus membranes or skin necrosis: Yes Has patient had Caitlen Worth PCN reaction that required hospitalization: No Has patient had Judeth Gilles PCN reaction occurring within the last 10 years: No If all of the above answers are "NO", then may proceed with Cephalosporin use.   Tequin [gatifloxacin] Rash        Medication List     STOP taking these medications    doxycycline 100 MG tablet Commonly known as: VIBRA-TABS   empagliflozin 25 MG Tabs tablet Commonly known as: JARDIANCE   gabapentin 600 MG tablet Commonly known as: Neurontin Replaced by: gabapentin 300 MG capsule       TAKE these medications    albuterol (2.5 MG/3ML) 0.083% nebulizer solution Commonly known as: PROVENTIL Take 3 mLs (2.5 mg total) by nebulization every  6 (six) hours as needed for wheezing or shortness of breath.   albuterol 108 (90 Base) MCG/ACT inhaler Commonly known as: VENTOLIN HFA Inhale 1-2 puffs into the lungs every 6 (six) hours as needed for wheezing or shortness of breath.   atorvastatin 40 MG tablet Commonly known as: LIPITOR Take 1 tablet (40 mg total) by mouth daily. for cholesterol.   benzonatate 100 MG capsule Commonly known as: TESSALON Take 100 mg by mouth daily as needed for cough.   chlorpheniramine-HYDROcodone 10-8 MG/5ML Commonly known as: TUSSIONEX Take 5 mLs by mouth every 12 (  twelve) hours as needed for cough.   cyclobenzaprine 10 MG tablet Commonly known as: FLEXERIL TAKE 1 TABLET BY MOUTH 3 TIMES DAILY AS NEEDED FOR MUSCLE SPASMS What changed: when to take this   Dexcom G7 Receiver Devi by Does not apply route.   Dexcom G7 Sensor Misc by Does not apply route.   diphenhydramine-acetaminophen 25-500 MG Tabs tablet Commonly known as: TYLENOL PM Take 1 tablet by mouth at bedtime.   Dulaglutide 4.5 MG/0.5ML Sopn Inject into the skin.   EPINEPHrine 0.3 mg/0.3 mL Soaj injection Commonly known as: EpiPen 2-Pak Inject 0.3 mg into the muscle as needed for anaphylaxis.   ergocalciferol 1.25 MG (50000 UT) capsule Commonly known as: VITAMIN D2 Take by mouth.   fexofenadine 180 MG tablet Commonly known as: ALLEGRA 1 tablet   FLUoxetine 40 MG capsule Commonly known as: PROZAC TAKE 1 CAPSULE BY MOUTH ONCE DAILY FOR ANXIETY AND DEPRESSION   fluticasone 50 MCG/ACT nasal spray Commonly known as: FLONASE Place 1 spray into both nostrils 2 (two) times daily.   gabapentin 300 MG capsule Commonly known as: NEURONTIN Take 1 capsule (300 mg total) by mouth 2 (two) times daily. Reduce the dose of gabapentin for now as your kidney function improves, you can resume your home dose as recommended by your PCP once you get follow up labs and clearance. Replaces: gabapentin 600 MG tablet   Glucagon 3 MG/DOSE  Powd Place into the nose.   hydrochlorothiazide 12.5 MG tablet Commonly known as: HYDRODIURIL Take 1 tablet (12.5 mg total) by mouth daily. for blood pressure. Start taking on: May 15, 2022   insulin degludec 100 UNIT/ML FlexTouch Pen Commonly known as: TRESIBA Inject 40 Units into the skin 2 (two) times daily.   Insulin Pen Needle 32G X 4 MM Misc Use 1 needle with pen as directed   levocetirizine 5 MG tablet Commonly known as: XYZAL TAKE ONE TABLET BY MOUTH EVERY EVENING   losartan 50 MG tablet Commonly known as: COZAAR Take 1 tablet (50 mg total) by mouth daily. for blood pressure Start taking on: May 15, 2022   metoprolol tartrate 25 MG tablet Commonly known as: LOPRESSOR TAKE 0.5 TABLET BY MOUTH TWICE DAILY FORBLOOD PRESSURE What changed: See the new instructions.   MULTIVITAMIN PO Take 1 tablet by mouth daily.   Nebulizer Misc Dispense 1 nebulizer for home use, with tubing and supplies. J45.909.   NovoLOG FlexPen 100 UNIT/ML FlexPen Generic drug: insulin aspart Inject 22-26 Units into the skin 3 (three) times daily with meals.   ondansetron 4 MG disintegrating tablet Commonly known as: ZOFRAN-ODT TAKE 1 TABLET BY MOUTH EVERY 8 HOURS AS NEEDED FOR NAUSEA AS DIRECTED.   VALERIAN ROOT PO Take 1,000 mg by mouth at bedtime.       Allergies  Allergen Reactions   Amoxicillin Shortness Of Breath and Rash    Patient states she was previously able to tolerate Keflex without issue    Bee Venom Anaphylaxis   Levofloxacin Shortness Of Breath and Rash   Meloxicam Hives and Rash   Vancomycin Anaphylaxis   Sulfa Antibiotics Rash   Ace Inhibitors Cough   Ciprofloxacin Hives   Codeine    Meperidine Hcl    Oxycodone-Acetaminophen    Tyloxapol     Other reaction(s): vomiting/rash   Penicillins Swelling and Rash    Has patient had Carrington Olazabal PCN reaction causing immediate rash, facial/tongue/throat swelling, SOB or lightheadedness with hypotension: Yes Has patient  had Jasamine Pottinger PCN reaction causing severe rash  involving mucus membranes or skin necrosis: Yes Has patient had Karthikeya Funke PCN reaction that required hospitalization: No Has patient had Bernis Stecher PCN reaction occurring within the last 10 years: No If all of the above answers are "NO", then may proceed with Cephalosporin use.    Tequin [Gatifloxacin] Rash    Follow-up Information     [facility] Follow up.   Specialty: Home Health Services Why: Physical Therapy-office to call with visit times. Contact information: [address] [place] [address] [phone]                  The results of significant diagnostics from this hospitalization (including imaging, microbiology, ancillary and laboratory) are listed below for reference.    Significant Diagnostic Studies: US RENAL  Result Date: 05/09/2022 CLINICAL DATA:  Hydronephrosis EXAM: RENAL / URINARY TRACT ULTRASOUND COMPLETE COMPARISON:  Same-day CT chest/abdomen/pelvis FINDINGS: Right Kidney: Renal measurements: 12.2 cm x 6.2 cm x 6.2 cm = volume: 246 mL. Parenchymal echogenicity is normal. There is no hydronephrosis on the current study. Left Kidney: Renal measurements: 11.4 cm x 5.2 cm x 5.2 cm = volume: 160 mL. 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 Parenchymal echogenicity is normal. There is no hydronephrosis. Bladder: The bladder could not be identified. Other: None. IMPRESSION: Normal sonographic appearance of the kidneys with no hydronephrosis on the current study. Electronically Signed   By: Valetta Mole M.D.   On: 05/09/2022 10:25   CT CHEST ABDOMEN PELVIS WO CONTRAST  Result Date: 05/09/2022 CLINICAL DATA:  Sepsis. EXAM: CT CHEST, ABDOMEN AND PELVIS WITHOUT CONTRAST TECHNIQUE: Multidetector CT imaging of the chest, abdomen and pelvis was performed following the standard protocol without IV contrast. RADIATION DOSE REDUCTION: This exam was performed according to the departmental dose-optimization program  which includes automated exposure control, adjustment of the mA and/or kV according to patient size and/or use of iterative reconstruction technique. COMPARISON:  Chest CT dated 06/19/2006 and radiograph dated 05/08/2022. FINDINGS: Evaluation of this exam is limited in the absence of intravenous contrast. Evaluation is also limited due to streak artifact caused by patient's arms. CT CHEST FINDINGS Cardiovascular: There is no cardiomegaly or pericardial effusion. There is coronary vascular calcification. Mild atherosclerotic calcification of the thoracic aorta. No aneurysmal dilatation. The central pulmonary arteries are grossly unremarkable. Left IJ central venous line with tip close to the cavoatrial junction. Mediastinum/Nodes: No hilar or mediastinal adenopathy. The esophagus is grossly unremarkable. No mediastinal fluid collection. Lungs/Pleura: There are bibasilar dependent atelectasis. Right upper lobe ground-glass streaky densities may represent atelectasis or infiltrate. Trace bilateral pleural effusions may be present. There is no pneumothorax. The central airways are patent. Musculoskeletal: No acute osseous pathology. Degenerative changes of the spine. CT ABDOMEN PELVIS FINDINGS No intra-abdominal free air or free fluid. Hepatobiliary: Fatty liver. Indeterminate low attenuating area involving the left lobe of the liver may represent more advanced CT steatosis. Other etiologies, including infarct, are not excluded but not evaluated on this noncontrast CT. No biliary dilatation. Cholecystectomy. Pancreas: Unremarkable. No pancreatic ductal dilatation or surrounding inflammatory changes. Spleen: Normal in size without focal abnormality. Adrenals/Urinary Tract: The adrenal glands are unremarkable there is Mishawn Didion punctate stone in the distal right ureter at the ureterovesical junction. There is mild right hydronephrosis. Punctate nonobstructing left renal upper pole calculus is also seen. There is no hydronephrosis  on the left. The left ureter is unremarkable. The urinary bladder is decompressed around Aleayah Chico Foley catheter. Stomach/Bowel: Brittyn Salaz gastric lap band is  seen. There is no bowel obstruction or active inflammation. The appendix is normal. Vascular/Lymphatic: Mild aortoiliac atherosclerotic disease. The IVC is unremarkable. No portal venous gas. There is no adenopathy. Reproductive: Hysterectomy.  No adnexal masses. Other: None Musculoskeletal: Degenerative changes of the spine. No acute osseous pathology. IMPRESSION: 1. Bibasilar dependent atelectasis. Right upper lobe ground-glass streaky densities may represent atelectasis or infiltrate. 2. Terica Yogi punctate right UVJ stone with mild right hydronephrosis. 3. Punctate nonobstructing left renal upper pole calculus. No hydronephrosis on the left. 4. Fatty liver. Indeterminate low attenuating area involving the left lobe of the liver may represent more advanced CT steatosis. Other etiologies, including infarct, are not excluded but not evaluated on this noncontrast CT. 5. No bowel obstruction. Normal appendix. 6.  Aortic Atherosclerosis (ICD10-I70.0). Electronically Signed   By: Anner Crete M.D.   On: 05/09/2022 01:50   US Abdomen Limited RUQ (LIVER/GB)  Result Date: 05/08/2022 CLINICAL DATA:  Septic shock EXAM: ULTRASOUND ABDOMEN LIMITED RIGHT UPPER QUADRANT COMPARISON:  None Available. FINDINGS: Gallbladder: Surgically removed Common bile duct: Diameter: 2.5 mm. Liver: Diffusely increased in echogenicity without focal mass. Portal vein is patent on color Doppler imaging with normal direction of blood flow towards the liver. Other: None. IMPRESSION: Status post cholecystectomy. Fatty infiltration of the liver. Electronically Signed   By: Inez Catalina M.D.   On: 05/08/2022 19:26   DG CHEST PORT 1 VIEW  Result Date: 05/08/2022 CLINICAL DATA:  Check central line placement, shortness of breath EXAM: PORTABLE CHEST 1 VIEW COMPARISON:  Film from earlier in the same day.  FINDINGS: New left jugular central line is noted at the cavoatrial junction. No pneumothorax is seen. Central vascular congestion is noted consistent with mild CHF. It appears slightly worsened when compare with the prior exam although this may be related to the poor inspiratory effort. No bony abnormality is seen. IMPRESSION: Status post central line placement.  No pneumothorax is noted. Changes of CHF. Electronically Signed   By: Inez Catalina M.D.   On: 05/08/2022 18:35   ECHOCARDIOGRAM COMPLETE  Result Date: 05/08/2022    ECHOCARDIOGRAM REPORT   Patient Name:   EATHER POEPPING Date of Exam: 05/08/2022 Medical Rec #:  [id number]       Height:       66.5 in Accession #:    [id number]      Weight:       310.0 lb Date of Birth:  26-Aug-1965        BSA:          2.423 m Patient Age:    52 years        BP:           89/58 mmHg Patient Gender: F               HR:           118 bpm. Exam Location:  Inpatient Procedure: 2D Echo STAT ECHO Indications:    acute respiratory failure  History:        Patient has no prior history of Echocardiogram examinations.                 Risk Factors:Diabetes, Hypertension and Dyslipidemia.  Sonographer:    Johny Chess RDCS Referring Phys: [place]  Sonographer Comments: Patient is obese. Image acquisition challenging due to patient body habitus, supine and Image acquisition challenging due to respiratory motion. IMPRESSIONS  1. Left ventricular ejection fraction, by estimation, is 50 to 55%. The left ventricle  has low normal function. The left ventricle demonstrates regional wall motion abnormalities with possible inferoseptal and inferior hypokinesis but images very difficult even with Definity. Left ventricular diastolic parameters are consistent with Grade I diastolic dysfunction (impaired relaxation).  2. Right ventricular systolic function is normal. The right ventricular size is normal. Tricuspid regurgitation signal is inadequate for assessing PA pressure.  3.  The mitral valve was not well visualized. No evidence of mitral valve regurgitation. No evidence of mitral stenosis.  4. The aortic valve is tricuspid. There is mild calcification of the aortic valve. Aortic valve regurgitation is not visualized. No aortic stenosis is present.  5. The inferior vena cava is normal in size with greater than 50% respiratory variability, suggesting right atrial pressure of 3 mmHg.  6. Technically difficult study with very poor images. FINDINGS  Left Ventricle: Left ventricular ejection fraction, by estimation, is 50 to 55%. The left ventricle has low normal function. The left ventricle demonstrates regional wall motion abnormalities. Definity contrast agent was given IV to delineate the left ventricular endocardial borders. The left ventricular internal cavity size was normal in size. There is no left ventricular hypertrophy. Left ventricular diastolic parameters are consistent with Grade I diastolic dysfunction (impaired relaxation). Right Ventricle: The right ventricular size is normal. No increase in right ventricular wall thickness. Right ventricular systolic function is normal. Tricuspid regurgitation signal is inadequate for assessing PA pressure. Left Atrium: Left atrial size was normal in size. Right Atrium: Right atrial size was normal in size. Pericardium: There is no evidence of pericardial effusion. Mitral Valve: The mitral valve was not well visualized. No evidence of mitral valve regurgitation. No evidence of mitral valve stenosis. Tricuspid Valve: The tricuspid valve is normal in structure. Tricuspid valve regurgitation is not demonstrated. Aortic Valve: The aortic valve is tricuspid. There is mild calcification of the aortic valve. Aortic valve regurgitation is not visualized. No aortic stenosis is present. Pulmonic Valve: The pulmonic valve was normal in structure. Pulmonic valve regurgitation is not visualized. Aorta: The aortic root is normal in size and structure.  Venous: The inferior vena cava is normal in size with greater than 50% respiratory variability, suggesting right atrial pressure of 3 mmHg. IAS/Shunts: No atrial level shunt detected by color flow Doppler.  LEFT VENTRICLE PLAX 2D LVIDd:         4.30 cm   Diastology LVIDs:         3.10 cm   LV e' medial:  4.79 cm/s LVOT diam:     2.00 cm   LV e' lateral: 6.20 cm/s LVOT Area:     3.14 cm  RIGHT VENTRICLE             IVC RV S prime:     13.50 cm/s  IVC diam: 1.40 cm TAPSE (M-mode): 1.8 cm LEFT ATRIUM             Index        RIGHT ATRIUM          Index LA diam:        3.20 cm 1.32 cm/m   RA Area:     6.84 cm LA Vol (A2C):   28.0 ml 11.56 ml/m  RA Volume:   11.80 ml 4.87 ml/m LA Vol (A4C):   35.7 ml 14.74 ml/m LA Biplane Vol: 34.9 ml 14.41 ml/m   AORTA Ao Root diam: 3.00 cm  SHUNTS Systemic Diam: 2.00 cm Dalton McleanMD Electronically signed by Franki Monte Signature Date/Time: 05/08/2022/1:18:15 PM  Final    DG Chest Port 1 View  Result Date: 05/08/2022 CLINICAL DATA:  Questionable sepsis - evaluate for abnormality EXAM: PORTABLE CHEST - 1 VIEW COMPARISON:  07/06/2014 FINDINGS: Cardiac silhouette is prominent. There is pulmonary interstitial prominence with vascular congestion. No focal consolidation. No pneumothorax or pleural effusion identified. IMPRESSION: Findings suggest CHF. Electronically Signed   By: Sammie Bench M.D.   On: 05/08/2022 10:54    Microbiology: Recent Results (from the past 240 hour(s))  Blood Culture (routine x 2)     Status: Abnormal   Collection Time: 05/08/22 10:15 AM   Specimen: BLOOD  Result Value Ref Range Status   Specimen Description BLOOD LEFT ANTECUBITAL  Final   Special Requests   Final    BOTTLES DRAWN AEROBIC AND ANAEROBIC Blood Culture adequate volume   Culture  Setup Time   Final    GRAM NEGATIVE RODS IN BOTH AEROBIC AND ANAEROBIC BOTTLES CRITICAL RESULT CALLED TO, READ BACK BY AND VERIFIED WITH: J WYLAND,PHARMD'@0330'$  05/09/22 Jeannette Performed at [facility], [place] [address] [place], [place]    Culture ESCHERICHIA COLI (Panfilo K[place]um)  Final   Report Status 05/10/2022 FINAL  Final   Organism ID, Bacteria ESCHERICHIA COLI  Final      Susceptibility   Escherichia coli - MIC*    AMPICILLIN >=32 RESISTANT Resistant     CEFEPIME <=0.12 SENSITIVE Sensitive     CEFTAZIDIME <=1 SENSITIVE Sensitive     CEFTRIAXONE <=0.25 SENSITIVE Sensitive     CIPROFLOXACIN <=0.25 SENSITIVE Sensitive     GENTAMICIN >=16 RESISTANT Resistant     IMIPENEM <=0.25 SENSITIVE Sensitive     TRIMETH/SULFA >=320 RESISTANT Resistant     AMPICILLIN/SULBACTAM 16 INTERMEDIATE Intermediate     PIP/TAZO <=4 SENSITIVE Sensitive     * ESCHERICHIA COLI  Blood Culture ID Panel (Reflexed)     Status: Abnormal   Collection Time: 05/08/22 10:15 AM  Result Value Ref Range Status   Enterococcus faecalis NOT DETECTED NOT DETECTED Final   Enterococcus Faecium NOT DETECTED NOT DETECTED Final   Listeria monocytogenes NOT DETECTED NOT DETECTED Final   Staphylococcus species NOT DETECTED NOT DETECTED Final   Staphylococcus aureus (BCID) NOT DETECTED NOT DETECTED Final   Staphylococcus epidermidis NOT DETECTED NOT DETECTED Final   Staphylococcus lugdunensis NOT DETECTED NOT DETECTED Final   Streptococcus species NOT DETECTED NOT DETECTED Final   Streptococcus agalactiae NOT DETECTED NOT DETECTED Final   Streptococcus pneumoniae NOT DETECTED NOT DETECTED Final   Streptococcus pyogenes NOT DETECTED NOT DETECTED Final   Odell Fasching.calcoaceticus-baumannii NOT DETECTED NOT DETECTED Final   Bacteroides fragilis NOT DETECTED NOT DETECTED Final   Enterobacterales DETECTED (Burle Kwan) NOT DETECTED Final    Comment: Enterobacterales represent Blayn Whetsell large order of gram negative bacteria, not Kenedy Haisley single organism. CRITICAL RESULT CALLED TO, READ BACK BY AND VERIFIED WITH: J WYLAND,PHARMD'@0330'$  05/09/22 [place]    Enterobacter cloacae complex NOT DETECTED NOT DETECTED Final   Escherichia coli DETECTED (Lavine Hargrove) NOT DETECTED  Final    Comment: CRITICAL RESULT CALLED TO, READ BACK BY AND VERIFIED WITH: J WYLAND,PHARMD'@0330'$  05/09/22 [place]    Klebsiella aerogenes NOT DETECTED NOT DETECTED Final   Klebsiella oxytoca NOT DETECTED NOT DETECTED Final   Klebsiella pneumoniae NOT DETECTED NOT DETECTED Final   Proteus species NOT DETECTED NOT DETECTED Final   Salmonella species NOT DETECTED NOT DETECTED Final   Serratia marcescens NOT DETECTED NOT DETECTED Final   Haemophilus influenzae NOT DETECTED NOT DETECTED Final   Neisseria meningitidis  NOT DETECTED NOT DETECTED Final   Pseudomonas aeruginosa NOT DETECTED NOT DETECTED Final   Stenotrophomonas maltophilia NOT DETECTED NOT DETECTED Final   Candida albicans NOT DETECTED NOT DETECTED Final   Candida auris NOT DETECTED NOT DETECTED Final   Candida glabrata NOT DETECTED NOT DETECTED Final   Candida krusei NOT DETECTED NOT DETECTED Final   Candida parapsilosis NOT DETECTED NOT DETECTED Final   Candida tropicalis NOT DETECTED NOT DETECTED Final   Cryptococcus neoformans/gattii NOT DETECTED NOT DETECTED Final   CTX-M ESBL NOT DETECTED NOT DETECTED Final   Carbapenem resistance IMP NOT DETECTED NOT DETECTED Final   Carbapenem resistance KPC NOT DETECTED NOT DETECTED Final   Carbapenem resistance NDM NOT DETECTED NOT DETECTED Final   Carbapenem resist OXA 48 LIKE NOT DETECTED NOT DETECTED Final   Carbapenem resistance VIM NOT DETECTED NOT DETECTED Final    Comment: Performed at [facility], [address], [place], [place] [address]  Resp panel by RT-PCR (RSV, Flu Kostantinos Tallman&B, Covid) Anterior Nasal Swab     Status: None   Collection Time: 05/08/22 10:26 AM   Specimen: Anterior Nasal Swab  Result Value Ref Range Status   SARS Coronavirus 2 by RT PCR NEGATIVE NEGATIVE Final   Influenza Charle Clear by PCR NEGATIVE NEGATIVE Final   Influenza B by PCR NEGATIVE NEGATIVE Final    Comment: (NOTE) The Xpert Xpress SARS-CoV-2/FLU/RSV plus assay is intended as an aid in the diagnosis of  influenza from Nasopharyngeal swab specimens and should not be used as Bobbyjoe Pabst sole basis for treatment. Nasal washings and aspirates are unacceptable for Xpert Xpress SARS-CoV-2/FLU/RSV testing.  Fact Sheet for Patients: EntrepreneurPulse.com.au  Fact Sheet for Healthcare Providers: IncredibleEmployment.be  This test is not yet approved or cleared by the [place] FDA and has been authorized for detection and/or diagnosis of SARS-CoV-2 by FDA under an Emergency Use Authorization (EUA). This EUA will remain in effect (meaning this test can be used) for the duration of the COVID-19 declaration under Section 564(b)(1) of the Act, 21 U.S.C. section 360bbb-3(b)(1), unless the authorization is terminated or revoked.     Resp Syncytial Virus by PCR NEGATIVE NEGATIVE Final    Comment: (NOTE) Fact Sheet for Patients: EntrepreneurPulse.com.au  Fact Sheet for Healthcare Providers: IncredibleEmployment.be  This test is not yet approved or cleared by the [place] FDA and has been authorized for detection and/or diagnosis of SARS-CoV-2 by FDA under an Emergency Use Authorization (EUA). This EUA will remain in effect (meaning this test can be used) for the duration of the COVID-19 declaration under Section 564(b)(1) of the Act, 21 U.S.C. section 360bbb-3(b)(1), unless the authorization is terminated or revoked.  Performed at [facility], [place] [address], [place], [place] [address]   Blood Culture (routine x 2)     Status: Abnormal   Collection Time: 05/08/22 10:30 AM   Specimen: BLOOD  Result Value Ref Range Status   Specimen Description BLOOD RIGHT ANTECUBITAL  Final   Special Requests   Final    BOTTLES DRAWN AEROBIC AND ANAEROBIC Blood Culture adequate volume   Culture  Setup Time   Final    GRAM NEGATIVE RODS IN BOTH AEROBIC AND ANAEROBIC BOTTLES CRITICAL VALUE NOTED.  VALUE IS CONSISTENT WITH  PREVIOUSLY REPORTED AND CALLED VALUE.    Culture (Ronda Kazmi)  Final    ESCHERICHIA COLI SUSCEPTIBILITIES PERFORMED ON PREVIOUS CULTURE WITHIN THE LAST 5 DAYS. Performed at [facility], [place] [address] [place], [place] [address]  Report Status 05/11/2022 FINAL  Final  Urine Culture     Status: Abnormal   Collection Time: 05/09/22  2:58 AM   Specimen: Urine, Catheterized  Result Value Ref Range Status   Specimen Description URINE, CATHETERIZED  Final   Special Requests NONE Reflexed from [id number]  Final   Culture (Rahul Malinak)  Final    <10,000 COLONIES/mL INSIGNIFICANT GROWTH Performed at [facility], [place] [address], [place],  [address]    Report Status 05/10/2022 FINAL  Final     Labs: Basic Metabolic Panel: Recent Labs  Lab 05/09/22 0256 05/10/22 0347 05/12/22 0656 05/13/22 0207 05/14/22 0152  NA 139 137 137 138 138  K 3.5 3.9 3.7 3.9 4.1  CL 105 102 105 106 106  CO2 '22 23 22 22 25  '$ GLUCOSE 168* 208* 122* 176* 155*  BUN 42* 57* 58* 48* 23*  CREATININE 2.53* 2.56* 1.96* 1.56* 1.00  CALCIUM 8.0* 7.9* 7.8* 8.1* 8.3*  MG 1.8 2.4 2.2 2.1  --   PHOS 2.2* 4.3 4.2 3.2  --    Liver Function Tests: Recent Labs  Lab 05/08/22 1026 05/09/22 0823 05/10/22 0347 05/12/22 0656  AST 39 51* 45* 32  ALT 34 '30 29 25  '$ ALKPHOS 901* 192* 142* 111  BILITOT 1.7* 0.7 0.5 0.7  PROT 6.2* 5.6* 5.2* 5.3*  ALBUMIN 2.6* 1.9* 1.8* 2.0*   No results for input(s): "LIPASE", "AMYLASE" in the last 168 hours. No results for input(s): "AMMONIA" in the last 168 hours. CBC: Recent Labs  Lab 05/08/22 1026 05/08/22 1250 05/09/22 0256 05/10/22 0347 05/12/22 0656 05/13/22 0207  WBC 4.8  --  29.5* 19.8* 10.4 11.3*  NEUTROABS 4.0  --   --  15.6* 6.4  --   HGB 15.1* 13.6 12.8 12.1 12.7 13.2  HCT 46.4* 40.0 37.8 36.0 38.1 39.6  MCV 91.7  --  88.7 89.6 87.8 88.2  PLT 108*  --  90* 76* 109* 133*   Cardiac Enzymes: No results for input(s): "CKTOTAL", "CKMB", "CKMBINDEX", "TROPONINI" in the  last 168 hours. BNP: BNP (last 3 results) No results for input(s): "BNP" in the last 8760 hours.  ProBNP (last 3 results) No results for input(s): "PROBNP" in the last 8760 hours.  CBG: Recent Labs  Lab 05/13/22 0806 05/13/22 1201 05/13/22 1659 05/13/22 2104 05/14/22 0748  GLUCAP 160* 152* 155* 207* 193*       Signed:  Fayrene Helper MD.  [facility] 05/14/2022, 10:43 AM

## 2022-05-13 NOTE — Progress Notes (Signed)
PROGRESS NOTE    [name]  [id number] DOB: 12/31/1965 DOA: 05/08/2022 PCP: Pleas Koch, NP  Chief Complaint  Patient presents with   Shortness of Breath    Brief Narrative:   Patient is [name] 57 year old female with past medical history as below, which is significant for diabetes, asthma, hypertension, MRSA infection, GERD, thyroid disease, and sleep apnea (long ago).  She presented to [facility] emergency department on 2/19 with complaints of URI symptoms and malaise x 6 days now complicated by nausea and vomiting x 24 hours.  1 day prior to arrival, she was started on doxycycline by PCP.  Upon arrival to the emergency department she was noted to be febrile and hypotensive.  With concern for sepsis she was started immediately on empiric antibiotics and provided with IVF resuscitation.  Lactic acid elevated at 7.  Despite IVF resuscitation, she remained hypotensive and was started on norepinephrine infusion. Echocardiogram in the ED did not demonstrate any significant cardiac dysfunction, but was volume down. PCCM admitted to ICU and continued volume resuscitation.  Did not intubate as patient respiratory status gradually became more stable on nasal cannula.    Assessment & Plan:   Principal Problem:   Septic shock ([place]) Active Problems:   Bacteremia  Septic shock E. coli bacteremia Lactic acidosis, resolved Continue CTX 10 days for E. coli bacteremia.  Leukocytosis improving, lactic acidosis resolved.  Improving and clinically stable to move out of ICU. - Continue CTX 2/20 - present.  Had planned to d/c on cipro.  Yesterday, she said she'd taken cipro after levaquin (which there's Kailey Esquilin known allergy).  Today, she said levaquin was given in 2000's and cipro in 90's.  She got dose of cipro today in preparation for d/c.  Discussed with pharmacy, patient will have 7 days abx course after 2/25.  Will give another dose of ceftriaxone 2/25 am.  Monitor for allergic reaction after cipro.   Benadryl ordered.  Epi prn signs of anaphylaxis. - unclear source, in setting of UVJ stone and mild hydro, suspect UTI most likely source though urine culture negative - Discontinue stress dose hydrocortisone   AKI, unchanged Obstructive R UVJ stone and hydronephrosis Baseline appears to be <1 Improving, follow outpatient  UA with 6-10 RBC's, 30 mg/dl protein Initial CT scan with R UVJ stone with mild right hydro Renal US 2/20 with normal appearance of kidneys without hydro  Repeat UA pending Will discuss with urology given persistent hydro - plan for outpatient urology f/u Strict I/O, daily weights  Acute hypoxic respiratory failure with hypercarbia Continues to be stable on nasal cannula, gradually weaning.  Atelectasis and recent illness likely limiting respiratory reserve. - SpO2 goal > 92%, wean as able - Encourage incentive spirometry hourly - Repeat outpatient sleep study, possible BiPAP need   Hypokinesis of septum on TTE Nonsustained run of Vtach EKG reassuring this morning, but concern given that patient had symptomatic Vtach in setting of TTE findings and persistently elevated troponin.  Concern for missed MI or other cardiac etiology. - echo with RWMA with inferoseptal and inferior hypokinesis (difficult images), grade 1 diastolic dysfunction - appreciate cardiology recs - no definite wall motion abnormality, troponin leak thought c/w septic shock, no concern for ACS.  NSVT artifact.   T2DM with hyperglycemia Hgb A1c 7.8 04/2022 on Jardiance and Trulicity at home as well as Novolog sliding scale (20-26 units) TID with meals and tresiba 40 units twice daily.  C - Hold home Jardiance and Trulicity in setting of  acute illness - 25 U levemir BID and SSI - adjust as needed   HTN Metoprolol on hold   Thrombocytopenia Monitor, likely related to sepsis     DVT prophylaxis: heparin Code Status: full Family Communication: daughter at bedside Disposition:   Status is:  Inpatient Remains inpatient appropriate because: need for continued inpatient workup   Consultants:  PCCM cards  Procedures:  Echo IMPRESSIONS     1. Left ventricular ejection fraction, by estimation, is 50 to 55%. The  left ventricle has low normal function. The left ventricle demonstrates  regional wall motion abnormalities with possible inferoseptal and inferior  hypokinesis but images very  difficult even with Definity. Left ventricular diastolic parameters are  consistent with Grade I diastolic dysfunction (impaired relaxation).   2. Right ventricular systolic function is normal. The right ventricular  size is normal. Tricuspid regurgitation signal is inadequate for assessing  PA pressure.   3. The mitral valve was not well visualized. No evidence of mitral valve  regurgitation. No evidence of mitral stenosis.   4. The aortic valve is tricuspid. There is mild calcification of the  aortic valve. Aortic valve regurgitation is not visualized. No aortic  stenosis is present.   5. The inferior vena cava is normal in size with greater than 50%  respiratory variability, suggesting right atrial pressure of 3 mmHg.   6. Technically difficult study with very poor images.   Antimicrobials:  Anti-infectives (From admission, onward)    Start     Dose/Rate Route Frequency Ordered Stop   05/14/22 0900  cefTRIAXone (ROCEPHIN) 2 g in sodium chloride 0.9 % 100 mL IVPB        2 g 200 mL/hr over 30 Minutes Intravenous  Once 05/13/22 1506     05/13/22 1500  ciprofloxacin (CIPRO) tablet 500 mg        500 mg Oral STAT 05/13/22 1403 05/13/22 1421   05/13/22 0000  ciprofloxacin (CIPRO) 500 MG tablet  Status:  Discontinued        500 mg Oral 2 times daily 05/13/22 1318 05/13/22    05/13/22 0000  ciprofloxacin (CIPRO) 500 MG tablet  Status:  Discontinued        500 mg Oral 2 times daily 05/13/22 1418 05/13/22    05/09/22 1300  cefTRIAXone (ROCEPHIN) 2 g in sodium chloride 0.9 % 100 mL IVPB   Status:  Discontinued        2 g 200 mL/hr over 30 Minutes Intravenous Every 24 hours 05/09/22 0914 05/13/22 1506   05/08/22 2300  linezolid (ZYVOX) IVPB 600 mg  Status:  Discontinued        600 mg 300 mL/hr over 60 Minutes Intravenous Every 12 hours 05/08/22 1807 05/09/22 0409   05/08/22 1900  aztreonam (AZACTAM) 1 g in sodium chloride 0.9 % 100 mL IVPB  Status:  Discontinued        1 g 200 mL/hr over 30 Minutes Intravenous Every 8 hours 05/08/22 1210 05/09/22 0914   05/08/22 1100  linezolid (ZYVOX) IVPB 600 mg        600 mg 300 mL/hr over 60 Minutes Intravenous Once 05/08/22 1050 05/08/22 1255   05/08/22 1030  aztreonam (AZACTAM) 2 g in sodium chloride 0.9 % 100 mL IVPB        2 g 200 mL/hr over 30 Minutes Intravenous  Once 05/08/22 1028 05/08/22 1156       Subjective: No complaints Eager to d/c when safe  Objective: Vitals:   05/12/22 2024  05/13/22 0405 05/13/22 0808 05/13/22 1702  BP: (!) 140/65 (!) 151/82 (!) 153/82 (!) 149/76  Pulse: 88 84 81 87  Resp: '20 20 19 19  '$ Temp: 98.7 F (37.1 C) 97.9 F (36.6 C) 97.8 F (36.6 C) 98.1 F (36.7 C)  TempSrc: Oral Oral Oral Oral  SpO2: 100% 95% 100% 100%  Weight:  (!) 152.9 kg    Height:        Intake/Output Summary (Last 24 hours) at 05/13/2022 1928 Last data filed at 05/13/2022 1900 Gross per 24 hour  Intake --  Output 2600 ml  Net -2600 ml   Filed Weights   05/11/22 0500 05/12/22 0326 05/13/22 0405  Weight: (!) 156 kg (!) 156.8 kg (!) 152.9 kg    Examination:  General: No acute distress. Cardiovascular: RRR Lungs: unlabored Abdomen: Soft, nontender, nondistended  Neurological: Alert and oriented 3. Moves all extremities 4 with equal strength. Cranial nerves II through XII grossly intact. Extremities: No clubbing or cyanosis. No edema. Data Reviewed: I have personally reviewed following labs and imaging studies  CBC: Recent Labs  Lab 05/08/22 1026 05/08/22 1250 05/09/22 0256 05/10/22 0347 05/12/22 0656  05/13/22 0207  WBC 4.8  --  29.5* 19.8* 10.4 11.3*  NEUTROABS 4.0  --   --  15.6* 6.4  --   HGB 15.1* 13.6 12.8 12.1 12.7 13.2  HCT 46.4* 40.0 37.8 36.0 38.1 39.6  MCV 91.7  --  88.7 89.6 87.8 88.2  PLT 108*  --  90* 76* 109* 133*    Basic Metabolic Panel: Recent Labs  Lab 05/08/22 1026 05/08/22 1250 05/09/22 0256 05/10/22 0347 05/12/22 0656 05/13/22 0207  NA 136 135 139 137 137 138  K 3.7 3.5 3.5 3.9 3.7 3.9  CL 100  --  105 102 105 106  CO2 20*  --  '22 23 22 22  '$ GLUCOSE 269*  --  168* 208* 122* 176*  BUN 41*  --  42* 57* 58* 48*  CREATININE 2.83*  --  2.53* 2.56* 1.96* 1.56*  CALCIUM 8.4*  --  8.0* 7.9* 7.8* 8.1*  MG  --   --  1.8 2.4 2.2 2.1  PHOS  --   --  2.2* 4.3 4.2 3.2    GFR: Estimated Creatinine Clearance: 61.2 mL/min (Bora Bost) (by C-G formula based on SCr of 1.56 mg/dL (H)).  Liver Function Tests: Recent Labs  Lab 05/08/22 1026 05/09/22 0823 05/10/22 0347 05/12/22 0656  AST 39 51* 45* 32  ALT 34 '30 29 25  '$ ALKPHOS 901* 192* 142* 111  BILITOT 1.7* 0.7 0.5 0.7  PROT 6.2* 5.6* 5.2* 5.3*  ALBUMIN 2.6* 1.9* 1.8* 2.0*    CBG: Recent Labs  Lab 05/12/22 1630 05/12/22 2025 05/13/22 0806 05/13/22 1201 05/13/22 1659  GLUCAP 158* 191* 160* 152* 155*     Recent Results (from the past 240 hour(s))  Blood Culture (routine x 2)     Status: Abnormal   Collection Time: 05/08/22 10:15 AM   Specimen: BLOOD  Result Value Ref Range Status   Specimen Description BLOOD LEFT ANTECUBITAL  Final   Special Requests   Final    BOTTLES DRAWN AEROBIC AND ANAEROBIC Blood Culture adequate volume   Culture  Setup Time   Final    GRAM NEGATIVE RODS IN BOTH AEROBIC AND ANAEROBIC BOTTLES CRITICAL RESULT CALLED TO, READ BACK BY AND VERIFIED WITH: J WYLAND,PHARMD'@0330'$  05/09/22 [place] Performed at [facility], [place] [address], [place], [place]    Culture  ESCHERICHIA COLI (Trust Crago)  Final   Report Status 05/10/2022 FINAL  Final   Organism ID, Bacteria ESCHERICHIA COLI   Final      Susceptibility   Escherichia coli - MIC*    AMPICILLIN >=32 RESISTANT Resistant     CEFEPIME <=0.12 SENSITIVE Sensitive     CEFTAZIDIME <=1 SENSITIVE Sensitive     CEFTRIAXONE <=0.25 SENSITIVE Sensitive     CIPROFLOXACIN <=0.25 SENSITIVE Sensitive     GENTAMICIN >=16 RESISTANT Resistant     IMIPENEM <=0.25 SENSITIVE Sensitive     TRIMETH/SULFA >=320 RESISTANT Resistant     AMPICILLIN/SULBACTAM 16 INTERMEDIATE Intermediate     PIP/TAZO <=4 SENSITIVE Sensitive     * ESCHERICHIA COLI  Blood Culture ID Panel (Reflexed)     Status: Abnormal   Collection Time: 05/08/22 10:15 AM  Result Value Ref Range Status   Enterococcus faecalis NOT DETECTED NOT DETECTED Final   Enterococcus Faecium NOT DETECTED NOT DETECTED Final   Listeria monocytogenes NOT DETECTED NOT DETECTED Final   Staphylococcus species NOT DETECTED NOT DETECTED Final   Staphylococcus aureus (BCID) NOT DETECTED NOT DETECTED Final   Staphylococcus epidermidis NOT DETECTED NOT DETECTED Final   Staphylococcus lugdunensis NOT DETECTED NOT DETECTED Final   Streptococcus species NOT DETECTED NOT DETECTED Final   Streptococcus agalactiae NOT DETECTED NOT DETECTED Final   Streptococcus pneumoniae NOT DETECTED NOT DETECTED Final   Streptococcus pyogenes NOT DETECTED NOT DETECTED Final   Ramere Downs.calcoaceticus-baumannii NOT DETECTED NOT DETECTED Final   Bacteroides fragilis NOT DETECTED NOT DETECTED Final   Enterobacterales DETECTED (Yamari Ventola) NOT DETECTED Final    Comment: Enterobacterales represent Ismael Treptow large order of gram negative bacteria, not Amarys Sliwinski single organism. CRITICAL RESULT CALLED TO, READ BACK BY AND VERIFIED WITH: J WYLAND,PHARMD'@0330'$  05/09/22 [place]    Enterobacter cloacae complex NOT DETECTED NOT DETECTED Final   Escherichia coli DETECTED (Eveleen Mcnear) NOT DETECTED Final    Comment: CRITICAL RESULT CALLED TO, READ BACK BY AND VERIFIED WITH: J WYLAND,PHARMD'@0330'$  05/09/22 [place]    Klebsiella aerogenes NOT DETECTED NOT DETECTED Final    Klebsiella oxytoca NOT DETECTED NOT DETECTED Final   Klebsiella pneumoniae NOT DETECTED NOT DETECTED Final   Proteus species NOT DETECTED NOT DETECTED Final   Salmonella species NOT DETECTED NOT DETECTED Final   Serratia marcescens NOT DETECTED NOT DETECTED Final   Haemophilus influenzae NOT DETECTED NOT DETECTED Final   Neisseria meningitidis NOT DETECTED NOT DETECTED Final   Pseudomonas aeruginosa NOT DETECTED NOT DETECTED Final   Stenotrophomonas maltophilia NOT DETECTED NOT DETECTED Final   Candida albicans NOT DETECTED NOT DETECTED Final   Candida auris NOT DETECTED NOT DETECTED Final   Candida glabrata NOT DETECTED NOT DETECTED Final   Candida krusei NOT DETECTED NOT DETECTED Final   Candida parapsilosis NOT DETECTED NOT DETECTED Final   Candida tropicalis NOT DETECTED NOT DETECTED Final   Cryptococcus neoformans/gattii NOT DETECTED NOT DETECTED Final   CTX-M ESBL NOT DETECTED NOT DETECTED Final   Carbapenem resistance IMP NOT DETECTED NOT DETECTED Final   Carbapenem resistance KPC NOT DETECTED NOT DETECTED Final   Carbapenem resistance NDM NOT DETECTED NOT DETECTED Final   Carbapenem resist OXA 48 LIKE NOT DETECTED NOT DETECTED Final   Carbapenem resistance VIM NOT DETECTED NOT DETECTED Final    Comment: Performed at [facility], [address] [place], [place] [address]  Resp panel by RT-PCR (RSV, Flu Qunicy Higinbotham&B, Covid) Anterior Nasal Swab     Status: None   Collection Time: 05/08/22 10:26 AM  Specimen: Anterior Nasal Swab  Result Value Ref Range Status   SARS Coronavirus 2 by RT PCR NEGATIVE NEGATIVE Final   Influenza Torrez Renfroe by PCR NEGATIVE NEGATIVE Final   Influenza B by PCR NEGATIVE NEGATIVE Final    Comment: (NOTE) The Xpert Xpress SARS-CoV-2/FLU/RSV plus assay is intended as an aid in the diagnosis of influenza from Nasopharyngeal swab specimens and should not be used as Charod Slawinski sole basis for treatment. Nasal washings and aspirates are unacceptable for Xpert Xpress  SARS-CoV-2/FLU/RSV testing.  Fact Sheet for Patients: EntrepreneurPulse.com.au  Fact Sheet for Healthcare Providers: IncredibleEmployment.be  This test is not yet approved or cleared by the [place] FDA and has been authorized for detection and/or diagnosis of SARS-CoV-2 by FDA under an Emergency Use Authorization (EUA). This EUA will remain in effect (meaning this test can be used) for the duration of the COVID-19 declaration under Section 564(b)(1) of the Act, 21 U.S.C. section 360bbb-3(b)(1), unless the authorization is terminated or revoked.     Resp Syncytial Virus by PCR NEGATIVE NEGATIVE Final    Comment: (NOTE) Fact Sheet for Patients: EntrepreneurPulse.com.au  Fact Sheet for Healthcare Providers: IncredibleEmployment.be  This test is not yet approved or cleared by the [place] FDA and has been authorized for detection and/or diagnosis of SARS-CoV-2 by FDA under an Emergency Use Authorization (EUA). This EUA will remain in effect (meaning this test can be used) for the duration of the COVID-19 declaration under Section 564(b)(1) of the Act, 21 U.S.C. section 360bbb-3(b)(1), unless the authorization is terminated or revoked.  Performed at [facility], [place] [address], [place], [place] [address]   Blood Culture (routine x 2)     Status: Abnormal   Collection Time: 05/08/22 10:30 AM   Specimen: BLOOD  Result Value Ref Range Status   Specimen Description BLOOD RIGHT ANTECUBITAL  Final   Special Requests   Final    BOTTLES DRAWN AEROBIC AND ANAEROBIC Blood Culture adequate volume   Culture  Setup Time   Final    GRAM NEGATIVE RODS IN BOTH AEROBIC AND ANAEROBIC BOTTLES CRITICAL VALUE NOTED.  VALUE IS CONSISTENT WITH PREVIOUSLY REPORTED AND CALLED VALUE.    Culture (Aisling Emigh)  Final    ESCHERICHIA COLI SUSCEPTIBILITIES PERFORMED ON PREVIOUS CULTURE WITHIN THE LAST 5 DAYS. Performed at  [facility], [place] [address], [place], [place] [address]    Report Status 05/11/2022 FINAL  Final  Urine Culture     Status: Abnormal   Collection Time: 05/09/22  2:58 AM   Specimen: Urine, Catheterized  Result Value Ref Range Status   Specimen Description URINE, CATHETERIZED  Final   Special Requests NONE Reflexed from [id number]  Final   Culture (Mihran Lebarron)  Final    <10,000 COLONIES/mL INSIGNIFICANT GROWTH Performed at [facility], [place] [address] [place], [place] [address]    Report Status 05/10/2022 FINAL  Final         Radiology Studies: No results found.      Scheduled Meds:  gabapentin  300 mg Oral BID   heparin  5,000 Units Subcutaneous Q8H   insulin aspart  0-20 Units Subcutaneous TID WC   insulin aspart  0-5 Units Subcutaneous QHS   insulin detemir  25 Units Subcutaneous BID   loratadine  10 mg Oral q1800   sodium chloride flush  10-40 mL Intracatheter Q12H   Continuous Infusions:  [START ON 05/14/2022] cefTRIAXone (ROCEPHIN)  IV       LOS: 5 days  Time spent: over 30 min    Fayrene Helper, MD [facility]   To contact the attending provider between 7A-7P or the covering provider during after hours 7P-7A, please log into the web site [email or web address] and access using universal [place] password for that web site. If you do not have the password, please call the hospital operator.  05/13/2022, 7:28 PM

## 2022-05-14 LAB — BASIC METABOLIC PANEL
Anion gap: 7 (ref 5–15)
BUN: 23 mg/dL — ABNORMAL HIGH (ref 6–20)
CO2: 25 mmol/L (ref 22–32)
Calcium: 8.3 mg/dL — ABNORMAL LOW (ref 8.9–10.3)
Chloride: 106 mmol/L (ref 98–111)
Creatinine, Ser: 1 mg/dL (ref 0.44–1.00)
GFR, Estimated: >60 mL/min (ref >60)
Glucose, Bld: 155 mg/dL — ABNORMAL HIGH (ref 70–99)
Potassium: 4.1 mmol/L (ref 3.5–5.1)
Sodium: 138 mmol/L (ref 135–145)

## 2022-05-14 LAB — GLUCOSE, CAPILLARY: Glucose-Capillary: 193 mg/dL — ABNORMAL HIGH (ref 70–99)

## 2022-05-14 MED ORDER — EPINEPHRINE 0.3 MG/0.3ML IJ SOAJ: 0.3000 mg | INTRAMUSCULAR | 1 refills | Status: AC | PRN

## 2022-05-15 ENCOUNTER — Telehealth: Payer: Self-pay | Admitting: *Deleted

## 2022-05-15 NOTE — Transitions of Care (Post Inpatient/ED Visit) (Signed)
   05/15/2022  Name: [name] MRN: [id number] DOB: 04-16-1965  Today's TOC FU Call Status: Today's TOC FU Call Status:: Successful TOC FU Call Competed TOC FU Call Complete Date: 05/15/22  Transition Care Management Follow-up Telephone Call Date of Discharge: 05/14/22 Discharge Facility: [facility]) Type of Discharge: Inpatient Admission Primary Inpatient Discharge Diagnosis:: Septic shock How have you been since you were released from the hospital?: Better (very weak) Any questions or concerns?: No  Items Reviewed: Did you receive and understand the discharge instructions provided?: Yes Medications obtained and verified?: Yes (Medications Reviewed) Any new allergies since your discharge?: No (Patient does have an epi pen and knows how to use it) Dietary orders reviewed?: No Do you have support at home?: Yes People in Home: spouse Name of Support/Comfort Primary Source: [facility] and Equipment/Supplies: [place] Ordered?: Yes Name of [place]:: [facility] Has Agency set up a time to come to your home?: No EMR reviewed for [place] Orders: Orders present/patient has not received call (refer to CM for follow-up) (Patient has number and will follow up) Any new equipment or medical supplies ordered?: NA  Functional Questionnaire: Do you need assistance with bathing/showering or dressing?: Yes Do you need assistance with meal preparation?: Yes Do you need assistance with eating?: No Do you have difficulty maintaining continence: No Do you need assistance with getting out of bed/getting out of a chair/moving?: Yes Do you have difficulty managing or taking your medications?: No  Folllow up appointments reviewed: PCP Follow-up appointment confirmed?: Yes (careguide made appointment) Date of PCP follow-up appointment?: 05/19/22 Follow-up Provider: Alma Friendly NP [facility] Follow-up appointment confirmed?: No Reason Specialist  Follow-Up Not Confirmed: Patient has Specialist Provider Number and will Call for Appointment Do you need transportation to your follow-up appointment?: No Do you understand care options if your condition(s) worsen?: Yes-patient verbalized understanding  SDOH Interventions Today    Flowsheet Row Most Recent Value  SDOH Interventions   Food Insecurity Interventions Intervention Not Indicated  Housing Interventions Intervention Not Indicated  Transportation Interventions Intervention Not Indicated      Interventions Today    Flowsheet Row Most Recent Value  Pharmacy Interventions   Pharmacy Dicussed/Reviewed Pharmacy Topics Discussed  [EPI PEN and usage]        [place] [facility] [phone]

## 2022-05-15 NOTE — Progress Notes (Signed)
  Care Coordination  Note  05/15/2022 Name: KAZIAH MIZUNO MRN: [id number] DOB: 03-25-1965  [name] is a 57 y.o. year old primary care patient of Pleas Koch, NP. I reached out to [name] by phone today to assist with scheduling a follow up appointment. [name] verbally consented to my assistance.       Follow up plan: Hospital Follow Up appointment scheduled with Carlis Abbott, NP) on (05/19/2022) at (320pm).  Julian Hy, [place] Direct Dial: [phone]

## 2022-05-16 DIAGNOSIS — N131 Hydronephrosis with ureteral stricture, not elsewhere classified: Secondary | ICD-10-CM | POA: Diagnosis not present

## 2022-05-16 DIAGNOSIS — J9602 Acute respiratory failure with hypercapnia: Secondary | ICD-10-CM | POA: Diagnosis not present

## 2022-05-16 DIAGNOSIS — I4729 Other ventricular tachycardia: Secondary | ICD-10-CM | POA: Diagnosis not present

## 2022-05-16 DIAGNOSIS — I110 Hypertensive heart disease with heart failure: Secondary | ICD-10-CM | POA: Diagnosis not present

## 2022-05-16 DIAGNOSIS — E1165 Type 2 diabetes mellitus with hyperglycemia: Secondary | ICD-10-CM | POA: Diagnosis not present

## 2022-05-16 DIAGNOSIS — J45909 Unspecified asthma, uncomplicated: Secondary | ICD-10-CM | POA: Diagnosis not present

## 2022-05-16 DIAGNOSIS — I358 Other nonrheumatic aortic valve disorders: Secondary | ICD-10-CM | POA: Diagnosis not present

## 2022-05-16 DIAGNOSIS — N179 Acute kidney failure, unspecified: Secondary | ICD-10-CM | POA: Diagnosis not present

## 2022-05-16 DIAGNOSIS — I509 Heart failure, unspecified: Secondary | ICD-10-CM | POA: Diagnosis not present

## 2022-05-17 ENCOUNTER — Encounter: Payer: Medicare PPO | Admitting: Pharmacist

## 2022-05-18 ENCOUNTER — Telehealth: Payer: Self-pay | Admitting: Primary Care

## 2022-05-18 DIAGNOSIS — I110 Hypertensive heart disease with heart failure: Secondary | ICD-10-CM | POA: Diagnosis not present

## 2022-05-18 DIAGNOSIS — J45909 Unspecified asthma, uncomplicated: Secondary | ICD-10-CM | POA: Diagnosis not present

## 2022-05-18 DIAGNOSIS — J9602 Acute respiratory failure with hypercapnia: Secondary | ICD-10-CM | POA: Diagnosis not present

## 2022-05-18 DIAGNOSIS — N179 Acute kidney failure, unspecified: Secondary | ICD-10-CM | POA: Diagnosis not present

## 2022-05-18 DIAGNOSIS — I358 Other nonrheumatic aortic valve disorders: Secondary | ICD-10-CM | POA: Diagnosis not present

## 2022-05-18 DIAGNOSIS — I509 Heart failure, unspecified: Secondary | ICD-10-CM | POA: Diagnosis not present

## 2022-05-18 DIAGNOSIS — N131 Hydronephrosis with ureteral stricture, not elsewhere classified: Secondary | ICD-10-CM | POA: Diagnosis not present

## 2022-05-18 DIAGNOSIS — E1165 Type 2 diabetes mellitus with hyperglycemia: Secondary | ICD-10-CM | POA: Diagnosis not present

## 2022-05-18 DIAGNOSIS — I4729 Other ventricular tachycardia: Secondary | ICD-10-CM | POA: Diagnosis not present

## 2022-05-18 NOTE — Telephone Encounter (Signed)
Called and advised Amy of the approval of the requested verbal orders for this patient. Advised to call back with any further questions.

## 2022-05-18 NOTE — Telephone Encounter (Signed)
[place] Name: [place] Agency Name: [facility] Phone #: [phone] secure  Service Requested: PT (examples: OT/PT/Skilled Nursing/Social Work/Speech Therapy/Wound Care)  Frequency of Visits: 2 times a week for 3 weeks, and then 1 time a week for 3 weeks.   When Amy reconciled medication there were 2 level 2 medication interaction warnings. cyclobenzaprine with chlorpheniramine-HYDROcodone (TUSSIONEX) 10-8 MG/5ML and then alo Gabapentin with the chlorpheniramine-HYDROcodone (TUSSIONEX) 10-8 MG/5ML

## 2022-05-18 NOTE — Telephone Encounter (Signed)
Approved.  Tussionex prescription removed from patient medication list as she is no longer taking.

## 2022-05-19 ENCOUNTER — Ambulatory Visit: Payer: Medicare PPO | Admitting: Primary Care

## 2022-05-19 ENCOUNTER — Telehealth: Payer: Self-pay

## 2022-05-19 ENCOUNTER — Encounter: Payer: Self-pay | Admitting: *Deleted

## 2022-05-19 ENCOUNTER — Encounter: Payer: Self-pay | Admitting: Primary Care

## 2022-05-19 VITALS — BP 122/64 | HR 88 | Temp 98.1°F | Ht 66.5 in | Wt 340.0 lb

## 2022-05-19 DIAGNOSIS — E1140 Type 2 diabetes mellitus with diabetic neuropathy, unspecified: Secondary | ICD-10-CM

## 2022-05-19 DIAGNOSIS — I4729 Other ventricular tachycardia: Secondary | ICD-10-CM | POA: Diagnosis not present

## 2022-05-19 DIAGNOSIS — I10 Essential (primary) hypertension: Secondary | ICD-10-CM

## 2022-05-19 DIAGNOSIS — N179 Acute kidney failure, unspecified: Secondary | ICD-10-CM

## 2022-05-19 DIAGNOSIS — R6521 Severe sepsis with septic shock: Secondary | ICD-10-CM

## 2022-05-19 DIAGNOSIS — G4733 Obstructive sleep apnea (adult) (pediatric): Secondary | ICD-10-CM | POA: Diagnosis not present

## 2022-05-19 DIAGNOSIS — A419 Sepsis, unspecified organism: Secondary | ICD-10-CM | POA: Diagnosis not present

## 2022-05-19 DIAGNOSIS — Z889 Allergy status to unspecified drugs, medicaments and biological substances status: Secondary | ICD-10-CM | POA: Diagnosis not present

## 2022-05-19 DIAGNOSIS — R29898 Other symptoms and signs involving the musculoskeletal system: Secondary | ICD-10-CM | POA: Diagnosis not present

## 2022-05-19 DIAGNOSIS — Z794 Long term (current) use of insulin: Secondary | ICD-10-CM

## 2022-05-19 HISTORY — DX: Acute kidney failure, unspecified: N17.9

## 2022-05-19 NOTE — Assessment & Plan Note (Signed)
Following with endocrinology.  Glucose readings appear stable. Reviewed A1c from recent hospital stay.  Continue Tresiba 40 units twice daily, NovoLog 22 to 26 units 3 times daily with meals, Trulicity 4.5 mg weekly.  Remain off of Jardiance.

## 2022-05-19 NOTE — Progress Notes (Signed)
Subjective:    Patient ID: [name], female    DOB: [birth year], 57 y.o.   MRN: [id number]  HPI  [name] is a very pleasant 57 y.o. female with a significant medical history including type 2 diabetes, renal stone, hypertension, thrombocytopenia, sleep apnea, chronic lower extremity weakness, migraines, multiple drug allergies, lumbar spinal stenosis, acute hypoxia, AKI, asthma who presents today for TCM hospital follow-up.  Her daughter joins [place] today.  She presented to [facility] ED on 05/08/2022 for 1 week history of respiratory illness with nausea, vomiting, coughing, progressive weakness.  Upon arrival she was noted to be febrile but did not notice this at home.  She had taken several doses of doxycycline that was provided during an E visit 2 days prior.  During her stay in the ED she was noted to be persistently hypotensive despite rehydration.  She is also noticed to be febrile, tachycardic, hypoxic.  Stat TTE was obtained which revealed low normal EF, otherwise without acute process.  Lactic acid was significantly elevated so she was admitted for distributive, septic shock.  During her hospital stay when she was initiated on vancomycin, aztreonam.  She underwent CT chest/abdomen/pelvis which revealed right upper lobe infiltrate, right UVJ stone with mild hydronephrosis, fatty liver, nonobstructing left renal upper pole calculus.  Initiated on norepinephrine and steroids.  Cardiology consulted given an episode of nonsustained ventricular tachycardia With elevated troponin.  Tachycardia was noted with artifact.  Elevated troponin was suspected to be secondary to demand ischemia in the setting of septic shock, no further inpatient workup was recommended, but she was recommended to follow-up in the outpatient setting.  Renal ultrasound obtained which was negative for renal stone or hydronephrosis.  She did require supplemental oxygen during her hospital stay.  It was also advised that she  repeat her sleep study as she may need BiPAP in the outpatient setting.  Her Jardiance was held given her kidney infection.  She was initiated on ciprofloxacin at some point during her stay which caused allergic reaction.  Referral to allergist was recommended given her multiple drug allergies.  She was discharged home on 05/13/2022 with recommendations for repeat CBC/CMP, outpatient cardiology follow-up, outpatient allergy follow-up, discontinuation of Jardiance, referral for sleep study.  Since her discharge home she is feeling much better.  She remains with a mild cough but this is improving.  She has noticed several episodes of palpitations with mild exertion, only when walking from the living room to her bedroom at night.  She denies palpitations or chest pain during the day.  She has an appointment scheduled with cardiology later this month.  She has not seen an allergist for her multiple drug allergies but is willing to do so.  Her last sleep study was years ago and she has never had a CPAP machine.    BP Readings from Last 3 Encounters:  05/19/22 122/64  05/14/22 137/74  01/12/22 122/66      Review of Systems  Respiratory:  Negative for shortness of breath.   Cardiovascular:  Positive for palpitations. Negative for chest pain.  Genitourinary:  Negative for dysuria.  Neurological:  Negative for dizziness and headaches.         Past Medical History:  Diagnosis Date   Allergy    Amyotrophic lateral sclerosis (ALS) ([place]) 09/23/2018   Anemia    Anxiety    Arthritis    Asthma    Bacteremia 05/09/2022   Cataract    CHF (congestive heart failure) (  [place])    Colon polyp    Complication of anesthesia    Cystitis with hematuria 10/24/2021   Diabetes mellitus    Displaced fracture of proximal end of right fibula 07/21/2017   Edema    Fatigue    Hypertension    IBS (irritable bowel syndrome)    Lump in female breast    Methicillin resistant Staphylococcus aureus in conditions  classified elsewhere and of unspecified site    Migraines    Morbid obesity (HCC)    Neuromuscular disorder (HCC)    Night sweats    Nonspecific abnormal results of thyroid function study    Other B-complex deficiencies    Paresthesias    PONV (postoperative nausea and vomiting)    Pure hypercholesterolemia    Reflux    Sacral fracture (HCC)    Sinus problem    Sleep apnea    Sore on toe 05/17/2021   Symptomatic states associated with artificial menopause    Thyroid disease    Type II or unspecified type diabetes mellitus without mention of complication, not stated as uncontrolled    Unspecified sleep apnea     Social History   Socioeconomic History   Marital status: Married    Spouse name: Not on file   Number of children: 3   Years of education: college   Highest education level: Not on file  Occupational History   Occupation: Data processing manager: [employer]  Tobacco Use   Smoking status: Never   Smokeless tobacco: Never  Vaping Use   Vaping Use: Never used  Substance and Sexual Activity   Alcohol use: Yes    Comment: Socially   Drug use: No   Sexual activity: Not Currently    Birth control/protection: None    Comment: Hysterectomy  Other Topics Concern   Not on file  Social History Narrative   Exercise: walking two days per week, 30 minutes.       Married x 29 years, happily married; no abuse.       Children: 3 children; 1 grandchild; two step grandchildren; 1 gg.      Lives: with husband, mother-in-law.      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      Tobacco: never      Alcohol: socially.  One glass of wine per month.      Exercise:  Walking around campus.        Seatbelt: 100%      Guns: gun safe in garage.   Social Determinants of Health   Financial Resource Strain: Low Risk  (09/30/2021)   Overall Financial Resource Strain (CARDIA)    Difficulty of Paying Living Expenses: Not hard at all  Food Insecurity: No Food  Insecurity (05/15/2022)   Hunger Vital Sign    Worried About Running Out of Food in the Last Year: Never true    Ran Out of Food in the Last Year: Never true  Transportation Needs: No Transportation Needs (05/15/2022)   PRAPARE - Hydrologist (Medical): No    Lack of Transportation (Non-Medical): No  Physical Activity: Inactive (09/30/2021)   Exercise Vital Sign    Days of Exercise per Week: 0 days    Minutes of Exercise per Session: 0 min  Stress: No Stress Concern Present (09/30/2021)   [place]    Feeling of Stress : Not at all  Social  Connections: Not on file  Intimate Partner Violence: Not At Risk (05/11/2022)   Humiliation, Afraid, Rape, and Kick questionnaire    Fear of Current or Ex-Partner: No    Emotionally Abused: No    Physically Abused: No    Sexually Abused: No    Past Surgical History:  Procedure Laterality Date   abcess removal  03/21/1995   MRSA   ABDOMINAL HYSTERECTOMY  03/21/1995   complete in 1997, partial was in 1995   carpal tunnel release r  07/03/2013   [place]  03/20/1989   COLONOSCOPY  03/21/2011   Normal.  [facility].   KNEE SURGERY  03/20/2004   LAPAROSCOPIC GASTRIC BANDING  05/18/2009   SPINE SURGERY     TONSILLECTOMY AND ADENOIDECTOMY  03/20/1972   TUBAL LIGATION     ULNAR TUNNEL RELEASE Right 02/05/2018   Procedure: CUBITAL TUNNEL RELEASE/DECOMPRESSION;  Surgeon: Melrose Nakayama, MD;  Location: [facility];  Service: Orthopedics;  Laterality: Right;    Family History  Problem Relation Age of Onset   Diabetes Mother    Heart disease Mother 8       CHF, CAD   Other Mother        muscle disease   Hypertension Mother    Hyperlipidemia Mother    Arthritis Mother        OA hip L s/p THR   Leukemia Mother 19   Kidney disease Mother    Diabetes Father    Heart disease Father        AMI/CABG/valve  replacement age 37.   Stroke Father    Diabetes Brother    Heart disease Brother        stent age 81.   Hypertension Brother    Obesity Brother    Stroke Brother    Fibromyalgia Daughter    Crohn's disease Daughter    Asthma Daughter    Obesity Daughter    Hypertension Son    Alzheimer's disease Maternal Grandmother    Emphysema Maternal Grandfather    Heart disease Paternal Grandmother    Breast cancer Other        2 Aunts   Asthma Daughter    Cancer Paternal Aunt    Varicose Veins Paternal Aunt     Allergies  Allergen Reactions   Amoxicillin Shortness Of Breath and Rash    Patient states she was previously able to tolerate Keflex without issue    Bee Venom Anaphylaxis   Levofloxacin Shortness Of Breath and Rash   Meloxicam Hives and Rash   Vancomycin Anaphylaxis   Sulfa Antibiotics Rash   Ace Inhibitors Cough   Ciprofloxacin Hives   Codeine    Meperidine Hcl    Oxycodone-Acetaminophen    Tyloxapol     Other reaction(s): vomiting/rash   Penicillins Swelling and Rash    Has patient had a PCN reaction causing immediate rash, facial/tongue/throat swelling, SOB or lightheadedness with hypotension: Yes Has patient had a PCN reaction causing severe rash involving mucus membranes or skin necrosis: Yes Has patient had a PCN reaction that required hospitalization: No Has patient had a PCN reaction occurring within the last 10 years: No If all of the above answers are "NO", then may proceed with Cephalosporin use.    Tequin [Gatifloxacin] Rash    Current Outpatient Medications on File Prior to Visit  Medication Sig Dispense Refill   albuterol (PROVENTIL) (2.5 MG/3ML) 0.083% nebulizer solution Take 3 mLs (2.5  mg total) by nebulization every 6 (six) hours as needed for wheezing or shortness of breath. 75 mL 2   albuterol (VENTOLIN HFA) 108 (90 Base) MCG/ACT inhaler Inhale 1-2 puffs into the lungs every 6 (six) hours as needed for wheezing or shortness of breath. 18 g 1    atorvastatin (LIPITOR) 40 MG tablet Take 1 tablet (40 mg total) by mouth daily. for cholesterol. 90 tablet 2   Continuous Blood Gluc Receiver ([place]) DEVI by Does not apply route.     Continuous Blood Gluc Sensor (DEXCOM G7 SENSOR) MISC by Does not apply route.     cyclobenzaprine (FLEXERIL) 10 MG tablet TAKE 1 TABLET BY MOUTH 3 TIMES DAILY AS NEEDED FOR MUSCLE SPASMS (Patient taking differently: Take 10 mg by mouth daily as needed for muscle spasms.) 270 tablet 0   diphenhydramine-acetaminophen (TYLENOL PM) 25-500 MG TABS tablet Take 1 tablet by mouth at bedtime.     Dulaglutide 4.5 MG/0.5ML SOPN Inject into the skin.     EPINEPHrine (EPIPEN 2-PAK) 0.3 mg/0.3 mL IJ SOAJ injection Inject 0.3 mg into the muscle as needed for anaphylaxis. 1 each 1   ergocalciferol (VITAMIN D2) 1.25 MG (50000 UT) capsule Take by mouth.     fexofenadine (ALLEGRA) 180 MG tablet 1 tablet     FLUoxetine (PROZAC) 40 MG capsule TAKE 1 CAPSULE BY MOUTH ONCE DAILY FOR ANXIETY AND DEPRESSION 90 capsule 3   fluticasone (FLONASE) 50 MCG/ACT nasal spray Place 1 spray into both nostrils 2 (two) times daily. 48 mL 1   gabapentin (NEURONTIN) 300 MG capsule Take 1 capsule (300 mg total) by mouth 2 (two) times daily. Reduce the dose of gabapentin for now as your kidney function improves, you can resume your home dose as recommended by your PCP once you get follow up labs and clearance. 60 capsule 0   hydrochlorothiazide (HYDRODIURIL) 12.5 MG tablet Take 1 tablet (12.5 mg total) by mouth daily. for blood pressure. 90 tablet 3   insulin aspart (NOVOLOG FLEXPEN) 100 UNIT/ML FlexPen Inject 22-26 Units into the skin 3 (three) times daily with meals. 30 mL 5   insulin degludec (TRESIBA) 100 UNIT/ML FlexTouch Pen Inject 40 Units into the skin 2 (two) times daily.     Insulin Pen Needle 32G X 4 MM MISC Use 1 needle with pen as directed 100 each 11   levocetirizine (XYZAL) 5 MG tablet TAKE ONE TABLET BY MOUTH EVERY EVENING 90 tablet  1   losartan (COZAAR) 50 MG tablet Take 1 tablet (50 mg total) by mouth daily. for blood pressure 90 tablet 2   metoprolol tartrate (LOPRESSOR) 25 MG tablet TAKE 0.5 TABLET BY MOUTH TWICE DAILY FORBLOOD PRESSURE (Patient taking differently: Take 12.5 mg by mouth 2 (two) times daily.) 90 tablet 1   Multiple Vitamin (MULTIVITAMIN PO) Take 1 tablet by mouth daily.      Nebulizer MISC Dispense 1 nebulizer for home use, with tubing and supplies. J45.909. 1 each 0   ondansetron (ZOFRAN-ODT) 4 MG disintegrating tablet TAKE 1 TABLET BY MOUTH EVERY 8 HOURS AS NEEDED FOR NAUSEA AS DIRECTED. 20 tablet 0   Glucagon 3 MG/DOSE POWD Place into the nose. (Patient not taking: Reported on 05/08/2022)     VALERIAN ROOT PO Take 1,000 mg by mouth at bedtime. (Patient not taking: Reported on 05/19/2022)     Current Facility-Administered Medications on File Prior to Visit  Medication Dose Route Frequency Provider Last Rate Last Admin   silver sulfADIAZINE (SILVADENE) 1 %  cream 1 Application  1 Application Topical Once Michela Pitcher, NP        BP 122/64   Pulse 88   Temp 98.1 F (36.7 C) (Temporal)   Ht 5' 6.5" (1.689 m)   Wt (!) 340 lb (154.2 kg) Comment: Patient reported  SpO2 98%   BMI 54.06 kg/m  Objective:   Physical Exam Cardiovascular:     Rate and Rhythm: Normal rate and regular rhythm.  Pulmonary:     Effort: Pulmonary effort is normal.     Breath sounds: Normal breath sounds.  Musculoskeletal:     Cervical back: Neck supple.  Skin:    General: Skin is warm and dry.  Neurological:     Mental Status: She is alert and oriented to person, place, and time.  Psychiatric:        Mood and Affect: Mood normal.           Assessment & Plan:  Septic shock [facility]) Assessment & Plan: Secondary to bacteremia. Recent hospitalization. Hospitalization noted, labs, imaging reviewed.  Appears well today.  Referrals placed as recommended. Repeat CBC and BMP pending.   AKI (acute kidney injury)  [facility]) Assessment & Plan: Recent hospitalization. Hospital notes, labs, imaging reviewed.  Repeat renal function pending.  Orders: -     Basic metabolic panel -     CBC -     Cortisol  Multiple drug allergies Assessment & Plan: Referral placed to allergist.  Orders: -     Ambulatory referral to Allergy  OSA (obstructive sleep apnea) Assessment & Plan: Referral placed to pulmonology for sleep study.  Orders: -     Ambulatory referral to Pulmonology  Essential hypertension Assessment & Plan: Controlled.  Continue hydrochlorothiazide 12.5 mg daily and metoprolol tartrate 12.5 mg twice daily. BMP pending.   Type 2 diabetes mellitus with diabetic neuropathy, with long-term current use of insulin [facility]) Assessment & Plan: Following with endocrinology.  Glucose readings appear stable. Reviewed A1c from recent hospital stay.  Continue Tresiba 40 units twice daily, NovoLog 22 to 26 units 3 times daily with meals, Trulicity 4.5 mg weekly.  Remain off of Jardiance.   Weakness of both lower extremities Assessment & Plan: Continued and is mostly wheelchair-bound. On disability.   NSVT (nonsustained ventricular tachycardia) (HCC) Assessment & Plan: During recent hospitalization.  She does experience palpitations which may or not be secondary. Follow-up with cardiology in the outpatient setting as scheduled.         Pleas Koch, NP

## 2022-05-19 NOTE — Assessment & Plan Note (Signed)
Referral placed to pulmonology for sleep study.

## 2022-05-19 NOTE — Assessment & Plan Note (Addendum)
Controlled.  Continue hydrochlorothiazide 12.5 mg daily and metoprolol tartrate 12.5 mg twice daily. BMP pending.

## 2022-05-19 NOTE — Assessment & Plan Note (Signed)
During recent hospitalization.  She does experience palpitations which may or not be secondary. Follow-up with cardiology in the outpatient setting as scheduled.

## 2022-05-19 NOTE — Assessment & Plan Note (Signed)
Continued and is mostly wheelchair-bound. On disability.

## 2022-05-19 NOTE — Assessment & Plan Note (Signed)
Recent hospitalization. Hospital notes, labs, imaging reviewed.  Repeat renal function pending.

## 2022-05-19 NOTE — Progress Notes (Signed)
Care Management & Coordination Services Pharmacy Team  Reason for Encounter: Appointment Reminder  Contacted patient to confirm telephone appointment with Charlene Brooke, PharmD on 05/24/2022 at 10:00.  Unsuccessful outreach. Left voicemail for patient to return call.  Star Rating Drugs:  Medication:  Last Fill: Day Supply    Trulicity 3 mg               03/27/22          30 Jardiance 25 mg         03/20/22          30 Losartan 50 mg           03/06/22          90  Care Gaps: Annual wellness visit in last year? Yes 09/30/21   If Diabetic: Last eye exam / retinopathy screening: Overdue Last diabetic foot exam: Overdue  Charlene Brooke, PharmD notified  [name], [place] [facility] Assistant [phone]

## 2022-05-19 NOTE — Assessment & Plan Note (Signed)
Secondary to bacteremia. Recent hospitalization. Hospitalization noted, labs, imaging reviewed.  Appears well today.  Referrals placed as recommended. Repeat CBC and BMP pending.

## 2022-05-19 NOTE — Assessment & Plan Note (Signed)
Referral placed to allergist.

## 2022-05-20 LAB — CBC
HCT: 40.7 % (ref 35.0–45.0)
Hemoglobin: 13.1 g/dL (ref 11.7–15.5)
MCH: 28.4 pg (ref 27.0–33.0)
MCHC: 32.2 g/dL (ref 32.0–36.0)
MCV: 88.3 fL (ref 80.0–100.0)
MPV: 10.7 fL (ref 7.5–12.5)
Platelets: 567 10*3/uL — ABNORMAL HIGH (ref 140–400)
RBC: 4.61 10*6/uL (ref 3.80–5.10)
RDW: 14.1 % (ref 11.0–15.0)
WBC: 7.5 10*3/uL (ref 3.8–10.8)

## 2022-05-20 LAB — BASIC METABOLIC PANEL
BUN/Creatinine Ratio: 16 (calc) (ref 6–22)
BUN: 17 mg/dL (ref 7–25)
CO2: 26 mmol/L (ref 20–32)
Calcium: 9.9 mg/dL (ref 8.6–10.4)
Chloride: 106 mmol/L (ref 98–110)
Creat: 1.07 mg/dL — ABNORMAL HIGH (ref 0.50–1.03)
Glucose, Bld: 92 mg/dL (ref 65–99)
Potassium: 4.5 mmol/L (ref 3.5–5.3)
Sodium: 142 mmol/L (ref 135–146)

## 2022-05-20 LAB — CORTISOL: Cortisol, Plasma: 4.3 ug/dL

## 2022-05-22 DIAGNOSIS — I358 Other nonrheumatic aortic valve disorders: Secondary | ICD-10-CM | POA: Diagnosis not present

## 2022-05-22 DIAGNOSIS — I509 Heart failure, unspecified: Secondary | ICD-10-CM | POA: Diagnosis not present

## 2022-05-22 DIAGNOSIS — N179 Acute kidney failure, unspecified: Secondary | ICD-10-CM | POA: Diagnosis not present

## 2022-05-22 DIAGNOSIS — I110 Hypertensive heart disease with heart failure: Secondary | ICD-10-CM | POA: Diagnosis not present

## 2022-05-22 DIAGNOSIS — E1165 Type 2 diabetes mellitus with hyperglycemia: Secondary | ICD-10-CM | POA: Diagnosis not present

## 2022-05-22 DIAGNOSIS — J45909 Unspecified asthma, uncomplicated: Secondary | ICD-10-CM | POA: Diagnosis not present

## 2022-05-22 DIAGNOSIS — N131 Hydronephrosis with ureteral stricture, not elsewhere classified: Secondary | ICD-10-CM | POA: Diagnosis not present

## 2022-05-22 DIAGNOSIS — J9602 Acute respiratory failure with hypercapnia: Secondary | ICD-10-CM | POA: Diagnosis not present

## 2022-05-22 DIAGNOSIS — I4729 Other ventricular tachycardia: Secondary | ICD-10-CM | POA: Diagnosis not present

## 2022-05-24 ENCOUNTER — Ambulatory Visit: Payer: Medicare PPO | Admitting: Pharmacist

## 2022-05-24 NOTE — Progress Notes (Signed)
Care Management & Coordination Services Pharmacy Note  05/24/2022 Name:  [name] MRN:  [id number] DOB:  [birth year]  Summary: Initial visit -Neuropathy: pt reports gabapentin dose was reduced in hospital due to acute kidney injury, she wonders if she can return to original dose if kidneys have recovered; reviewed recent BMP, Cr has returned to baseline (~1.07) and GFR now > 60 again, it would be reasonable to resume 600 mg BID -HTN: Pt has resumed BP meds, PT has been checking her BP at home and has not raised any alarms, pt does not have any specific values to report -DM: A1c 7.8% (04/2022); [name] was recently stopped in hospital d/t hx of recurrent UTIs, pt reports glucose range 150-200 since discharge -HLD: LDL 62 (06/2021); noted after visit that atorvastatin has not been filled since 07/2021 per Surescripts, pt did not mention that she had run out of med or took it differently than prescribed. Will verify fill dates with pharmacy and address with patient at follow up as needed.  Recommendations/Changes made from today's visit: -Consider increase gabapentin back to 600 mg BID (AKI resolved) - consulting with PCP  Follow up plan: -[place] will call pharmacy to verify statin fill dates -Pharmacist follow up PRN -Cardiology appt 06/09/22    Subjective: [name] is an 57 y.o. year old female who is a primary patient of Pleas Koch, NP.  The care coordination team was consulted for assistance with disease management and care coordination needs.    Engaged with patient by telephone for initial visit. Patient lives at home with her husband. She is disabled, mostly wheelchair-bound, can walk short distances with walker. She does not drive, her husband and/or daughter will drive her to appts.   Recent office visits: 05/19/22 NP [name] OV: hospital f/u- referral to allergy and pulm (sleep study)  01/12/22 [name], MD OV: asthma - Rx doxycycline, hydrocodone  cough syrup; Update Tresiba to 40 u BID. D/C budesonide, colestipol (not taking).  10/24/21 [name], NP OV: Cystitis. Tx with macrobid. Rx silver sulfadiazine cream for blisters.  Recent consult visits: 05/06/22 [name], NP (Family Med) Maxillary Sinusitis Start: Doxycycline 100 mg Start: Mucinex D  02/01/22 [name], FNP (Endo) DM No med changes F/U 3 months  11/02/21 - 11/30/21 wound care  10/19/21 Bayley McMichael Gertie Fey) Alternating constipation and diarrhea   07/28/21 Dr Vallarie Mare (Neurology): Possible ALS - unlikely at this point. 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 Focus on BG control, rehab, weight loss. Discuss steroid injections for carpal tunnel, connect with bariatric medicine team for wt loss and rehab.  Hospital visits: 05/08/22 - 05/14/22 Admission [facility]): septic shock, required IVF and pressors. Tx E. Coli bacteremia. AKI and obstructive UVJ stone found, consider urology f/u outpatient. Cipro allergy added, refer to allergy. Stop jardiance d/t recurrent UTIs   Objective:  Lab Results  Component Value Date   CREATININE 1.07 (H) 05/19/2022   BUN 17 05/19/2022   GFR 71.76 05/25/2020   GFRNONAA >60 05/14/2022   GFRAA >60 01/31/2018   NA 142 05/19/2022   K 4.5 05/19/2022   CALCIUM 9.9 05/19/2022   CO2 26 05/19/2022   GLUCOSE 92 05/19/2022    Lab Results  Component Value Date/Time   HGBA1C 7.8 (H) 05/08/2022 03:41 PM   HGBA1C 7.1 05/18/2020 12:00 AM   HGBA1C 10.6 (A) 10/01/2019 08:39 AM   HGBA1C 10.1 (A) 01/30/2019 08:02 AM   HGBA1C 9.4 (H) 07/15/2018 08:31 AM   GFR 71.76  05/25/2020 08:34 AM   GFR 66.64 10/01/2019 09:09 AM   MICROALBUR <0.7 06/14/2021 08:44 AM   MICROALBUR 1.5 01/10/2016 08:42 AM    Last diabetic Eye exam:  Lab Results  Component Value Date/Time   HMDIABEYEEXA No Retinopathy 09/10/2020 12:00 AM    Last diabetic Foot exam: No results found for: "HMDIABFOOTEX"   Lab Results  Component Value Date   CHOL 148  06/21/2021   HDL 54.40 06/21/2021   LDLCALC 62 06/21/2021   LDLDIRECT 72.0 05/25/2020   TRIG 162.0 (H) 06/21/2021   CHOLHDL 3 06/21/2021       Latest Ref Rng & Units 05/12/2022    6:56 AM 05/10/2022    3:47 AM 05/09/2022    8:23 AM  Hepatic Function  Total Protein 6.5 - 8.1 g/dL 5.3  5.2  5.6   Albumin 3.5 - 5.0 g/dL 2.0  1.8  1.9   AST 15 - 41 U/L 32  45  51   ALT 0 - 44 U/L '25  29  30   '$ Alk Phosphatase 38 - 126 U/L 111  142  192   Total Bilirubin 0.3 - 1.2 mg/dL 0.7  0.5  0.7   Bilirubin, Direct 0.0 - 0.2 mg/dL   0.3     Lab Results  Component Value Date/Time   TSH 4.14 05/25/2020 08:34 AM   TSH 5.02 (H) 10/01/2019 09:09 AM   FREET4 1.02 10/01/2019 09:09 AM   FREET4 0.79 01/30/2019 08:39 AM       Latest Ref Rng & Units 05/19/2022    3:54 PM 05/13/2022    2:07 AM 05/12/2022    6:56 AM  CBC  WBC 3.8 - 10.8 Thousand/uL 7.5  11.3  10.4   Hemoglobin 11.7 - 15.5 g/dL 13.1  13.2  12.7   Hematocrit 35.0 - 45.0 % 40.7  39.6  38.1   Platelets 140 - 400 Thousand/uL 567  133  109     Lab Results  Component Value Date/Time   VD25OH 27.98 (L) 05/25/2020 08:34 AM   VD25OH 30.08 10/01/2019 09:09 AM   VITAMINB12 722 10/28/2012 08:49 AM    Clinical ASCVD: No  The ASCVD Risk score (Arnett DK, et al., 2019) failed to calculate for the following reasons:   The patient has a prior MI or stroke diagnosis       05/19/2022    3:14 PM 09/30/2021    3:05 PM 07/11/2021    2:24 PM  Depression screen PHQ 2/9  Decreased Interest 0 0   Down, Depressed, Hopeless 0 0 0  PHQ - 2 Score 0 0 0  Altered sleeping 0  0  Tired, decreased energy 2  0  Change in appetite 2  0  Feeling bad or failure about yourself  0  0  Trouble concentrating 2  0  Moving slowly or fidgety/restless 0  0  Suicidal thoughts 0  0  PHQ-9 Score 6  0  Difficult doing work/chores Not difficult at all       Social History   Tobacco Use  Smoking Status Never  Smokeless Tobacco Never   BP Readings from Last 3  Encounters:  05/19/22 122/64  05/14/22 137/74  01/12/22 122/66   Pulse Readings from Last 3 Encounters:  05/19/22 88  05/14/22 83  01/12/22 63   Wt Readings from Last 3 Encounters:  05/19/22 (!) 340 lb (154.2 kg)  05/14/22 (!) 332 lb 3.7 oz (150.7 kg)  01/12/22 (!) 310 lb (140.6 kg)  BMI Readings from Last 3 Encounters:  05/19/22 54.06 kg/m  05/14/22 52.82 kg/m  01/12/22 49.29 kg/m    Allergies  Allergen Reactions   Amoxicillin Shortness Of Breath and Rash    Patient states she was previously able to tolerate Keflex without issue    Bee Venom Anaphylaxis   Levofloxacin Shortness Of Breath and Rash   Meloxicam Hives and Rash   Vancomycin Anaphylaxis   Sulfa Antibiotics Rash   Ace Inhibitors Cough   Ciprofloxacin Hives   Codeine    Meperidine Hcl    Oxycodone-Acetaminophen    Tyloxapol     Other reaction(s): vomiting/rash   Penicillins Swelling and Rash    Has patient had a PCN reaction causing immediate rash, facial/tongue/throat swelling, SOB or lightheadedness with hypotension: Yes Has patient had a PCN reaction causing severe rash involving mucus membranes or skin necrosis: Yes Has patient had a PCN reaction that required hospitalization: No Has patient had a PCN reaction occurring within the last 10 years: No If all of the above answers are "NO", then may proceed with Cephalosporin use.    Tequin [Gatifloxacin] Rash    Medications Reviewed Today     Reviewed by Charlton Haws, [facility] (Pharmacist) on 05/24/22 at 1039  Med List Status: <None>   Medication Order Taking? Sig Documenting Provider Last Dose Status Informant  albuterol (PROVENTIL) (2.5 MG/3ML) 0.083% nebulizer solution [id number] No Take 3 mLs (2.5 mg total) by nebulization every 6 (six) hours as needed for wheezing or shortness of breath.  Patient not taking: Reported on 05/24/2022   Tonia Ghent, MD Not Taking Active Child, Spouse/Significant Other, Pharmacy Records  albuterol (VENTOLIN  HFA) 108 (90 Base) MCG/ACT inhaler [id number] Yes Inhale 1-2 puffs into the lungs every 6 (six) hours as needed for wheezing or shortness of breath. Tonia Ghent, MD Taking Active Child, Spouse/Significant Other, Pharmacy Records  atorvastatin (LIPITOR) 40 MG tablet [id number] Yes Take 1 tablet (40 mg total) by mouth daily. for cholesterol. Pleas Koch, NP Taking Active Child, Spouse/Significant Other, Pharmacy Records  Bacillus Coagulans-Inulin (PROBIOTIC-PREBIOTIC PO) [id number] Yes Take by mouth. [provider] Taking Active   Continuous Blood Gluc Receiver ([place]) [place] [id number] Yes by Does not apply route. [provider] Taking Active Child, Spouse/Significant Other, Pharmacy Records  Continuous Blood Gluc Sensor ([place]) [place] [id number] Yes by Does not apply route. [provider] Taking Active Child, Spouse/Significant Other, Pharmacy Records  cyclobenzaprine (FLEXERIL) 10 MG tablet [id number] Yes TAKE 1 TABLET BY MOUTH 3 TIMES DAILY AS NEEDED FOR MUSCLE SPASMS  Patient taking differently: Take 10 mg by mouth 3 (three) times daily.   Pleas Koch, NP Taking Active Child, Spouse/Significant Other, Pharmacy Records  diphenhydramine-acetaminophen (TYLENOL PM) 25-500 MG TABS tablet [id number] Yes Take 1 tablet by mouth at bedtime. [provider] Taking Active Child, Spouse/Significant Other, Pharmacy Records  Dulaglutide 4.5 MG/0.5ML SOPN [id number] Yes Inject into the skin. [provider] Taking Active Child, Spouse/Significant Other, Pharmacy Records  EPINEPHrine (EPIPEN 2-PAK) 0.3 mg/0.3 mL IJ SOAJ injection [id number]  Inject 0.3 mg into the muscle as needed for anaphylaxis. Elodia Florence., MD  Active   ergocalciferol (VITAMIN D2) 1.25 MG (50000 UT) capsule [id number] Yes Take by mouth. [provider] Taking Active Child, Spouse/Significant Other, Pharmacy Records  fexofenadine (ALLEGRA) 180 MG tablet  [id number] Yes 1 tablet [provider] Taking Active Child, Spouse/Significant Other, Pharmacy Records  FLUoxetine (PROZAC) 40 MG capsule [id number] Yes TAKE  1 CAPSULE BY MOUTH ONCE DAILY FOR ANXIETY AND DEPRESSION Pleas Koch, NP Taking Active Child, Spouse/Significant Other, Pharmacy Records  fluticasone [facility]) 50 MCG/ACT nasal spray [id number] Yes Place 1 spray into both nostrils 2 (two) times daily. Pleas Koch, NP Taking Active Child, Spouse/Significant Other, Pharmacy Records  gabapentin (NEURONTIN) 300 MG capsule [id number] Yes Take 1 capsule (300 mg total) by mouth 2 (two) times daily. Reduce the dose of gabapentin for now as your kidney function improves, you can resume your home dose as recommended by your PCP once you get follow up labs and clearance. Elodia Florence., MD Taking Active   Glucagon 3 MG/DOSE POWD [id number] No Place into the nose.  Patient not taking: Reported on 05/08/2022   [provider] Not Taking Active Child, Spouse/Significant Other, Pharmacy Records  hydrochlorothiazide (HYDRODIURIL) 12.5 MG tablet [id number] Yes Take 1 tablet (12.5 mg total) by mouth daily. for blood pressure. Elodia Florence., MD Taking Active   insulin aspart (NOVOLOG FLEXPEN) 100 UNIT/ML FlexPen [id number] Yes Inject 22-26 Units into the skin 3 (three) times daily with meals. Pleas Koch, NP Taking Active Child, Spouse/Significant Other, Pharmacy Records  insulin degludec (TRESIBA) 100 UNIT/ML FlexTouch Pen [id number] Yes Inject 40 Units into the skin 2 (two) times daily. Tonia Ghent, MD Taking Active Child, Spouse/Significant Other, Pharmacy Records  Insulin Pen Needle 32G X 4 MM MISC [id number]  Use 1 needle with pen as directed Carlis Abbott Leticia Penna, NP  Active Child, Spouse/Significant Other, Pharmacy Records  levocetirizine (XYZAL) 5 MG tablet [id number] Yes TAKE ONE TABLET BY MOUTH EVERY EVENING Pleas Koch, NP Taking Active Child,  Spouse/Significant Other, Pharmacy Records  losartan (COZAAR) 50 MG tablet [id number] Yes Take 1 tablet (50 mg total) by mouth daily. for blood pressure Elodia Florence., MD Taking Active   metoprolol tartrate (LOPRESSOR) 25 MG tablet [id number] Yes TAKE 0.5 TABLET BY MOUTH TWICE DAILY FORBLOOD PRESSURE  Patient taking differently: Take 12.5 mg by mouth 2 (two) times daily.   Pleas Koch, NP Taking Active Child, Spouse/Significant Other, Pharmacy Records  Multiple Vitamin (MULTIVITAMIN PO) [id number] Yes Take 1 tablet by mouth daily.  [provider] Taking Active Child, Spouse/Significant Other, Pharmacy Records  [place] [id number] Yes Dispense 1 nebulizer for home use, with tubing and supplies. [id number]. Tonia Ghent, MD Taking Active Child, Spouse/Significant Other, Pharmacy Records  ondansetron (ZOFRAN-ODT) 4 MG disintegrating tablet [id number] Yes TAKE 1 TABLET BY MOUTH EVERY 8 HOURS AS NEEDED FOR NAUSEA AS DIRECTED. Pleas Koch, NP Taking Active Child, Spouse/Significant Other, Pharmacy Records  silver sulfADIAZINE (SILVADENE) 1 % cream 1 Application [id number]   Michela Pitcher, NP  Active   VALERIAN ROOT PO [id number] Yes Take 1,000 mg by mouth at bedtime. [provider] Taking Active Child, Spouse/Significant Other, Pharmacy Records            SDOH:  (Social Determinants of Health) assessments and interventions performed: No SDOH Interventions    Flowsheet Row Telephone from 05/15/2022 in [place] Interventions   Food Insecurity Interventions Intervention Not Indicated  Housing Interventions Intervention Not Indicated  Transportation Interventions Intervention Not Indicated       Medication Assistance: None required.  Patient affirms current coverage meets needs.  Medication Access: Within the past 30 days, how often has patient missed a dose of medication? 0 Is a pillbox or other method  used to improve adherence? Yes --uses AM/PM weekly  pill box; she also has pill dispenser due to hand issues Factors that may affect medication adherence? no barriers identified Are meds synced by current pharmacy? No  Are meds delivered by current pharmacy? No  Does patient experience delays in picking up medications due to transportation concerns? No   [facility] Services Reviewed: Is patient disadvantaged to use [facility]?: No  Current Rx insurance plan: [employer] Name and location of Current pharmacy:  [facility], [address] [place] [address] Phone: [phone] Fax: [phone]  [id number] [place]Phone: [phone]: [phone] [facility] services reviewed with patient today?: No  Patient requests to transfer care to [facility] Pharm[facility]?: No  Reason patient declined to change pharmacies: Not mentioned at this visit  Compliance/Adherence/Medication fill history: Care Gaps: Foot exam (due 02/2019) Eye exam (due 08/2021)  Star-Rating Drugs: Atorvastatin - PDC 0% Losartan - PDC 95%   ASSESSMENT / PLAN  Hypertension (BP goal <130/80) -Controlled - not checking at home, PT does check twice a week right now and have not raised any alarm;  -Current treatment: HCTZ 12.5 mg daily - Appropriate, Effective, Safe, Accessible Losartan 50 mg daily - Appropriate, Effective, Safe, Accessible Metoprolol tartrate 25 mg - 1/2 tab BID -Appropriate, Effective, Safe, Accessible -Medications previously tried: n/a  -Denies hypotensive/hypertensive symptoms -Educated on BP goals and benefits of medications for prevention of heart attack, stroke and kidney damage;Importance of home blood pressure monitoring; -Counseled to monitor BP at home periodically -Recommended to continue current medication  Hyperlipidemia: (LDL goal < 70) -Query  Controlled - LDL 62 (06/2021) at goal; last statin refill was 08/12/21 x 90 ds, pt did not mention that she had run out of atorvastatin -Current treatment: Atorvastatin 40 mg daily - Appropriate, Effective, Safe, Accessible (if taking) -Medications previously tried: n/a  -Will verify statin fill dates with pharmacy, will address with patient at follow up  Diabetes (A1c goal <7%) -Not ideally controlled - A1c 7.8% (04/2022); peak 14.4% in 08/2016; Jardiance was recently stopped in hospital d/t hx of recurrent UTIs -Follows with endocrine, Dr HBurney Gauze-DM Dx 1994; diabetic neuropathy contributing to severe weakness -Current home glucose readings: 150-200 -Denies hypoglycemic/hyperglycemic symptoms -Current medications: Trulicity 4.5 mg weekly - Appropriate, Query Effective Novolog 22-26 w/meals -Appropriate, Query Effective Tresiba 40 units -Appropriate, Query Effective Dexcom G7  -Medications previously tried: Jardiance (frequent UTI); glucagon  -Current meal patterns:   Breakfast: heart healthy snack (trail mix) -Current exercise: limited -Educated on A1c and blood sugar goals; Benefits of weight loss; -Recommended to continue current medication; f/u with endocrine as scheduled  Depression/Anxiety (Goal: manage symptoms) -Controlled - per patient report -PHQ9: 6 (05/2022) - mild depression -GAD7: 0 (05/2022) - minimal anxiety -Connected with PCP for mental health support -Current treatment: Fluoxetine 40 mg daily - Appropriate, Effective, Safe, Accessible -Medications previously tried/failed: n/a -Educated on Benefits of medication for symptom control -Recommended to continue current medication  Asthma (Goal: control symptoms and prevent exacerbations) -Controlled - pt reports seasonal symptoms; she also has history of multiple drug allergies and has been referred to an allergist -Current treatment  Albuterol HFA - used once since d/c Albuterol nebulizer - not using Fexofenadine 180  mg AM - Appropriate, Effective, Safe, Accessible Levocetirizine 5 mg PM - Appropriate, Effective, Safe, Accessible -Medications previously tried: Advair  -Pulmonary function testing: 06/2007 - normal results -Exacerbations requiring treatment in last 6 months: 0 -Frequency of  rescue inhaler use: rare -Recommended to continue current medication  Neuropathy (Goal: manage symptoms) -Not ideally controlled - pt reports gabapentin dose as decreased in recent hospitalization due to AKI; she reports lower dose does not control nerve pain very well and would like to return to original dose (600 mg BID) -Current treatment  Gabapentin 300 mg BID - Appropriate, Query Effective -Medications previously tried: n/a -Reviewed BMP results per 3/1 OV -  Cr near baseline (1.07), GFR > 60; it would be reasonable to resume higher dose of gabapentin - consulting with PCP  Health Maintenance -Vaccine gaps: Shingrix - had Shingles early 2000s; discussed vaccine benefits even with history of active disease -Vitamin D 50,000IU  Charlene Brooke, PharmD, BCACP Clinical Pharmacist [facility] [phone]

## 2022-05-25 DIAGNOSIS — N131 Hydronephrosis with ureteral stricture, not elsewhere classified: Secondary | ICD-10-CM | POA: Diagnosis not present

## 2022-05-25 DIAGNOSIS — E1165 Type 2 diabetes mellitus with hyperglycemia: Secondary | ICD-10-CM | POA: Diagnosis not present

## 2022-05-25 DIAGNOSIS — I358 Other nonrheumatic aortic valve disorders: Secondary | ICD-10-CM | POA: Diagnosis not present

## 2022-05-25 DIAGNOSIS — N179 Acute kidney failure, unspecified: Secondary | ICD-10-CM | POA: Diagnosis not present

## 2022-05-25 DIAGNOSIS — J45909 Unspecified asthma, uncomplicated: Secondary | ICD-10-CM | POA: Diagnosis not present

## 2022-05-25 DIAGNOSIS — I110 Hypertensive heart disease with heart failure: Secondary | ICD-10-CM | POA: Diagnosis not present

## 2022-05-25 DIAGNOSIS — J9602 Acute respiratory failure with hypercapnia: Secondary | ICD-10-CM | POA: Diagnosis not present

## 2022-05-25 DIAGNOSIS — I509 Heart failure, unspecified: Secondary | ICD-10-CM | POA: Diagnosis not present

## 2022-05-25 DIAGNOSIS — I4729 Other ventricular tachycardia: Secondary | ICD-10-CM | POA: Diagnosis not present

## 2022-05-26 ENCOUNTER — Other Ambulatory Visit: Payer: Self-pay | Admitting: Primary Care

## 2022-05-26 DIAGNOSIS — M62838 Other muscle spasm: Secondary | ICD-10-CM

## 2022-05-29 DIAGNOSIS — N131 Hydronephrosis with ureteral stricture, not elsewhere classified: Secondary | ICD-10-CM | POA: Diagnosis not present

## 2022-05-29 DIAGNOSIS — I509 Heart failure, unspecified: Secondary | ICD-10-CM | POA: Diagnosis not present

## 2022-05-29 DIAGNOSIS — J45909 Unspecified asthma, uncomplicated: Secondary | ICD-10-CM | POA: Diagnosis not present

## 2022-05-29 DIAGNOSIS — E1165 Type 2 diabetes mellitus with hyperglycemia: Secondary | ICD-10-CM | POA: Diagnosis not present

## 2022-05-29 DIAGNOSIS — J9602 Acute respiratory failure with hypercapnia: Secondary | ICD-10-CM | POA: Diagnosis not present

## 2022-05-29 DIAGNOSIS — I4729 Other ventricular tachycardia: Secondary | ICD-10-CM | POA: Diagnosis not present

## 2022-05-29 DIAGNOSIS — N179 Acute kidney failure, unspecified: Secondary | ICD-10-CM | POA: Diagnosis not present

## 2022-05-29 DIAGNOSIS — I110 Hypertensive heart disease with heart failure: Secondary | ICD-10-CM | POA: Diagnosis not present

## 2022-05-29 DIAGNOSIS — I358 Other nonrheumatic aortic valve disorders: Secondary | ICD-10-CM | POA: Diagnosis not present

## 2022-05-30 ENCOUNTER — Telehealth: Payer: Self-pay | Admitting: Nurse Practitioner

## 2022-05-30 ENCOUNTER — Telehealth: Payer: Self-pay | Admitting: Pharmacist

## 2022-05-30 DIAGNOSIS — N179 Acute kidney failure, unspecified: Secondary | ICD-10-CM

## 2022-05-30 NOTE — Telephone Encounter (Signed)
In the absence of [name], NP I have signed the home health paperwork and placed in the box to be faxed

## 2022-05-30 NOTE — Telephone Encounter (Signed)
Form faxed, copied, and sent to scan.

## 2022-05-30 NOTE — Patient Instructions (Signed)
Visit Information  Phone number for Pharmacist: [phone]  Thank you for meeting with me to discuss your medications! I look forward to working with you to achieve your health care goals. Below is a summary of what we talked about during the visit:  Summary: Initial visit -Neuropathy: pt reports gabapentin dose was reduced in hospital due to acute kidney injury, she wonders if she can return to original dose if kidneys have recovered; reviewed recent BMP, Cr has returned to baseline (~1.07) and GFR now > 60 again, it would be reasonable to resume 600 mg BID -HTN: Pt has resumed BP meds, PT has been checking her BP at home and has not raised any alarms, pt does not have any specific values to report -HLD: LDL 62 (06/2021); noted after visit that atorvastatin has not been filled since 07/2021 per [facility], pt did not mention that she had run out of med or took it differently than prescribed. Will verify fill dates with pharmacy and address with patient at follow up as needed.  Recommendations/Changes made from today's visit: -Consider increase gabapentin back to 600 mg BID (AKI resolved) - consulting with PCP  Follow up plan: -[place] will call pharmacy to verify statin fill dates -Pharmacist follow up PRN -Cardiology appt 06/09/22   [name], PharmD, BCACP Clinical Pharmacist [facility] [phone]

## 2022-05-30 NOTE — Telephone Encounter (Signed)
Patient reports her gabapentin was reduced from 600 mg BID to 300 mg BID while she was in the hospital, in setting of acute kidney injury. She reports lower dose is not as effective for neuropathy and she wonders if she can resume original dose if kidneys have recovered.  Reviewed recent BMP results, Cr has returned to near baseline and GFR > 60 again, it would be reasonable to resume 600 mg BID.  Routing to PCP for input.  Preferred pharmacy: [facility], [address] [place] [address] Phone: [phone] Fax: [phone]      Latest Ref Rng & Units 05/19/2022    3:54 PM 05/14/2022    1:52 AM 05/13/2022    2:07 AM  BMP  Glucose 65 - 99 mg/dL 92  155  176   BUN 7 - 25 mg/dL 17  23  48   Creatinine 0.50 - 1.03 mg/dL 1.07  1.00  1.56   BUN/Creat Ratio 6 - 22 (calc) 16     Sodium 135 - 146 mmol/L 142  138  138   Potassium 3.5 - 5.3 mmol/L 4.5  4.1  3.9   Chloride 98 - 110 mmol/L 106  106  106   CO2 20 - 32 mmol/L '26  25  22   '$ Calcium 8.6 - 10.4 mg/dL 9.9  8.3  8.1     Estimated Creatinine Clearance: 89.7 mL/min (A) (by C-G formula based on SCr of 1.07 mg/dL (H)).

## 2022-05-31 NOTE — Telephone Encounter (Signed)
Noted. I will change this in her medication list.

## 2022-05-31 NOTE — Telephone Encounter (Signed)
Thanks, Mendel Ryder.  I'm comfortable with resuming 600 mg BID, but recommend repeat renal function in a few weeks.   Kelli, does she have the gabapentin 600 mg dose at home? Enough pills? Also needs lab only appt for 2-3 weeks.

## 2022-05-31 NOTE — Telephone Encounter (Signed)
Called and spoke with patient she has plenty of '600mg'$  pills at home right now. She will let us know when a refill is needed.  Scheduled lab appt for 3 weeks

## 2022-06-01 DIAGNOSIS — E1165 Type 2 diabetes mellitus with hyperglycemia: Secondary | ICD-10-CM | POA: Diagnosis not present

## 2022-06-01 DIAGNOSIS — J45909 Unspecified asthma, uncomplicated: Secondary | ICD-10-CM | POA: Diagnosis not present

## 2022-06-01 DIAGNOSIS — N179 Acute kidney failure, unspecified: Secondary | ICD-10-CM | POA: Diagnosis not present

## 2022-06-01 DIAGNOSIS — J9602 Acute respiratory failure with hypercapnia: Secondary | ICD-10-CM | POA: Diagnosis not present

## 2022-06-01 DIAGNOSIS — N131 Hydronephrosis with ureteral stricture, not elsewhere classified: Secondary | ICD-10-CM | POA: Diagnosis not present

## 2022-06-01 DIAGNOSIS — I358 Other nonrheumatic aortic valve disorders: Secondary | ICD-10-CM | POA: Diagnosis not present

## 2022-06-01 DIAGNOSIS — I4729 Other ventricular tachycardia: Secondary | ICD-10-CM | POA: Diagnosis not present

## 2022-06-01 DIAGNOSIS — I509 Heart failure, unspecified: Secondary | ICD-10-CM | POA: Diagnosis not present

## 2022-06-01 DIAGNOSIS — I110 Hypertensive heart disease with heart failure: Secondary | ICD-10-CM | POA: Diagnosis not present

## 2022-06-02 ENCOUNTER — Other Ambulatory Visit: Payer: Self-pay | Admitting: Primary Care

## 2022-06-02 DIAGNOSIS — R000 Tachycardia, unspecified: Secondary | ICD-10-CM

## 2022-06-02 DIAGNOSIS — I10 Essential (primary) hypertension: Secondary | ICD-10-CM

## 2022-06-07 DIAGNOSIS — Z7985 Long-term (current) use of injectable non-insulin antidiabetic drugs: Secondary | ICD-10-CM | POA: Diagnosis not present

## 2022-06-07 DIAGNOSIS — Z794 Long term (current) use of insulin: Secondary | ICD-10-CM | POA: Diagnosis not present

## 2022-06-07 DIAGNOSIS — E1165 Type 2 diabetes mellitus with hyperglycemia: Secondary | ICD-10-CM | POA: Diagnosis not present

## 2022-06-08 DIAGNOSIS — I358 Other nonrheumatic aortic valve disorders: Secondary | ICD-10-CM | POA: Diagnosis not present

## 2022-06-08 DIAGNOSIS — E1165 Type 2 diabetes mellitus with hyperglycemia: Secondary | ICD-10-CM | POA: Diagnosis not present

## 2022-06-08 DIAGNOSIS — J9602 Acute respiratory failure with hypercapnia: Secondary | ICD-10-CM | POA: Diagnosis not present

## 2022-06-08 DIAGNOSIS — J45909 Unspecified asthma, uncomplicated: Secondary | ICD-10-CM | POA: Diagnosis not present

## 2022-06-08 DIAGNOSIS — I509 Heart failure, unspecified: Secondary | ICD-10-CM | POA: Diagnosis not present

## 2022-06-08 DIAGNOSIS — N179 Acute kidney failure, unspecified: Secondary | ICD-10-CM | POA: Diagnosis not present

## 2022-06-08 DIAGNOSIS — I110 Hypertensive heart disease with heart failure: Secondary | ICD-10-CM | POA: Diagnosis not present

## 2022-06-08 DIAGNOSIS — I4729 Other ventricular tachycardia: Secondary | ICD-10-CM | POA: Diagnosis not present

## 2022-06-08 DIAGNOSIS — N131 Hydronephrosis with ureteral stricture, not elsewhere classified: Secondary | ICD-10-CM | POA: Diagnosis not present

## 2022-06-08 NOTE — Progress Notes (Deleted)
Primary Care Provider: Pleas Koch, NP [facility] Cardiologist: None Electrophysiologist: None  Clinic Note: No chief complaint on file.   ===================================  ASSESSMENT/PLAN   Problem List Items Addressed This Visit   None   ===================================  HPI:    [name] is a 57 y.o. female with history of HTN, HLD, DM-2, hypothyroidism, IBS, history of nephrolithiasis, lumbar spine stenosis and OSA with recent hospitalization for septic shock who is being seen today for the evaluation of "Abnormal Echocardiogram" at the request of Pleas Koch, NP.  [name] was last seen for inpatient consultation on2/21/2024 by Sande Rives, PA and Dr. Peter Martinique.  (See below)  Recent Hospitalizations:  Admitted  05/08/2022 with URI symptoms and generalized malaise for 6 days followed by nausea vomiting and diarrhea for 24 hours.  Was febrile 102.1 and hypotensive with pressures in the 60s.  Code sepsis called she was started on empiric antibiotics and IV fluids.  Chest x-ray suggested vascular congestion but no focal consolidation consistent with CHF.  Troponin levels were in the low 100s. => Blood cultures positive for E. coli thought to be related to pyelonephritis. Septic shock due to E. coli pyelonephritis-initially on Levophed. Echo suggested possible inferior/inferoseptal hypokinesis with EF 50 to 55% => cardiology consult was obtained the patient was seen by Dr. Martinique who reviewed the echo and felt that there was no significant regional wall motion normalities.  There was no evidence of true NSVT on telemetry.  Elevated troponin felt to be related to demand ischemia from septic shock.  Not consistent with ACS.  => NO ADDITIONAL CARDIAC WORKUP NEEDED  Seen by Dr. Carlis Abbott on May 19, 2022--suggested OSA evaluation.  For some reason cardiology consultation was recommended to the hospitalization.  She noted feeling better since  discharge home cough.  Several episodes of palpitations with mild exertion walking bedroom but no palpitations or chest pain during the day.   Reviewed  CV studies:    The following studies were reviewed today: (if available, images/films reviewed: From Epic Chart or Care Everywhere) 2D Echo 05/08/2022: Challenging study due to body habitus and patient position.  Challenging respiratory motion.  Even with Definity, difficult to assess wall motion but suggest the possibility of inferoseptal and inferior hypokinesis with EF 50 to 55%.  Mild aortic valve calcification but otherwise normal.  Normal right atrial pressures.  Unable to assess PAP.   Interval History:   [name]   CV Review of Symptoms (Summary): Cardiovascular ROS: {roscv:310661}  REVIEWED OF SYSTEMS   ROS  I have reviewed and (if needed) personally updated the patient's problem list, medications, allergies, past medical and surgical history, social and family history.   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/2018   Anemia    Anxiety    Arthritis    Asthma    Bacteremia 05/09/2022   Cataract    CHF (congestive heart failure) (HCC)    Colon polyp    Complication of anesthesia    Cystitis with hematuria 10/24/2021   Diabetes mellitus    Displaced fracture of proximal end of right fibula 07/21/2017   Edema    Fatigue    Hypertension    IBS (irritable bowel syndrome)    Lump in female breast    Methicillin resistant Staphylococcus aureus in conditions classified elsewhere and of unspecified site    Migraines  Morbid obesity ([place])    Neuromuscular disorder ([place])    Night sweats    Nonspecific abnormal results of thyroid function study    Other B-complex deficiencies    Paresthesias    PONV (postoperative nausea and vomiting)    Pure hypercholesterolemia    Reflux    Sacral fracture ([place])    Sinus problem    Sleep  apnea    Sore on toe 05/17/2021   Symptomatic states associated with artificial menopause    Thyroid disease    Type II or unspecified type diabetes mellitus without mention of complication, not stated as uncontrolled    Unspecified sleep apnea     PAST SURGICAL HISTORY   Past Surgical History:  Procedure Laterality Date   abcess removal  03/21/1995   MRSA   ABDOMINAL HYSTERECTOMY  03/21/1995   complete in 1997, partial was in 1995   carpal tunnel release r  07/03/2013   [place]  03/20/1989   COLONOSCOPY  03/21/2011   Normal.  [facility].   KNEE SURGERY  03/20/2004   LAPAROSCOPIC GASTRIC BANDING  05/18/2009   SPINE SURGERY     TONSILLECTOMY AND ADENOIDECTOMY  03/20/1972   TUBAL LIGATION     ULNAR TUNNEL RELEASE Right 02/05/2018   Procedure: CUBITAL TUNNEL RELEASE/DECOMPRESSION;  Surgeon: Melrose Nakayama, MD;  Location: [facility];  Service: Orthopedics;  Laterality: Right;    Immunization History  Administered Date(s) Administered   Hepatitis B, ADULT 10/28/2012, 12/19/2012, 04/29/2013   Influenza Split 12/24/2010, 12/18/2011   Influenza,inj,Quad PF,6+ Mos 12/19/2012, 01/12/2014, 12/28/2014, 01/10/2016, 02/15/2017, 01/10/2018, 01/16/2019, 12/31/2019, 01/21/2021   Influenza-Unspecified 02/15/2017   Moderna Sars-Covid-2 Vaccination 05/12/2019, 06/10/2019, 11/25/2019   Pneumococcal Polysaccharide-23 08/22/2016   Pneumococcal-Unspecified 12/18/2005   Tdap 07/16/2009, 07/27/2020    MEDICATIONS/ALLERGIES   No outpatient medications have been marked as taking for the 06/09/22 encounter (Appointment) with Leonie Man, MD.   Current Facility-Administered Medications for the 06/09/22 encounter (Appointment) with Leonie Man, MD  Medication   silver sulfADIAZINE (SILVADENE) 1 % cream 1 Application    Allergies  Allergen Reactions   Amoxicillin Shortness Of Breath and Rash    Patient states she was previously able to  tolerate Keflex without issue    Bee Venom Anaphylaxis   Levofloxacin Shortness Of Breath and Rash   Meloxicam Hives and Rash   Vancomycin Anaphylaxis   Sulfa Antibiotics Rash   Ace Inhibitors Cough   Ciprofloxacin Hives   Codeine    Meperidine Hcl    Oxycodone-Acetaminophen    Tyloxapol     Other reaction(s): vomiting/rash   Penicillins Swelling and Rash    Has patient had a PCN reaction causing immediate rash, facial/tongue/throat swelling, SOB or lightheadedness with hypotension: Yes Has patient had a PCN reaction causing severe rash involving mucus membranes or skin necrosis: Yes Has patient had a PCN reaction that required hospitalization: No Has patient had a PCN reaction occurring within the last 10 years: No If all of the above answers are "NO", then may proceed with Cephalosporin use.    Tequin [Gatifloxacin] Rash    SOCIAL HISTORY/FAMILY HISTORY   Reviewed in Epic:   Social History   Tobacco Use   Smoking status: Never   Smokeless tobacco: Never  Vaping Use   Vaping Use: Never used  Substance Use Topics   Alcohol use: Yes    Comment: Socially   Drug use: No   Social History  Social History Narrative   Exercise: walking two days per week, 30 minutes.       Married x 29 years, happily married; no abuse.       Children: 3 children; 1 grandchild; two step grandchildren; 1 gg.      Lives: with husband, mother-in-law.      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      Tobacco: never      Alcohol: socially.  One glass of wine per month.      Exercise:  Walking around campus.        Seatbelt: 100%      Guns: gun safe in garage.   Family History  Problem Relation Age of Onset   Diabetes Mother    Heart disease Mother 18       CHF, CAD   Other Mother        muscle disease   Hypertension Mother    Hyperlipidemia Mother    Arthritis Mother        OA hip L s/p THR   Leukemia Mother 45   Kidney disease Mother    Diabetes Father    Heart  disease Father        AMI/CABG/valve replacement age 53.   Stroke Father    Diabetes Brother    Heart disease Brother        stent age 62.   Hypertension Brother    Obesity Brother    Stroke Brother    Fibromyalgia Daughter    Crohn's disease Daughter    Asthma Daughter    Obesity Daughter    Hypertension Son    Alzheimer's disease Maternal Grandmother    Emphysema Maternal Grandfather    Heart disease Paternal Grandmother    Breast cancer Other        2 Aunts   Asthma Daughter    Cancer Paternal Aunt    Varicose Veins Paternal Aunt     OBJCTIVE -PE, EKG, labs   Wt Readings from Last 3 Encounters:  05/19/22 (!) 340 lb (154.2 kg)  05/14/22 (!) 332 lb 3.7 oz (150.7 kg)  01/12/22 (!) 310 lb (140.6 kg)    Physical Exam: There were no vitals taken for this visit. Physical Exam   Adult ECG Report  Rate: *** ;  Rhythm: {rhythm:17366};   Narrative Interpretation: ***  Recent Labs:  ***  Lab Results  Component Value Date   CHOL 148 06/21/2021   HDL 54.40 06/21/2021   LDLCALC 62 06/21/2021   LDLDIRECT 72.0 05/25/2020   TRIG 162.0 (H) 06/21/2021   CHOLHDL 3 06/21/2021   Lab Results  Component Value Date   CREATININE 1.07 (H) 05/19/2022   BUN 17 05/19/2022   NA 142 05/19/2022   K 4.5 05/19/2022   CL 106 05/19/2022   CO2 26 05/19/2022      Latest Ref Rng & Units 05/19/2022    3:54 PM 05/13/2022    2:07 AM 05/12/2022    6:56 AM  CBC  WBC 3.8 - 10.8 Thousand/uL 7.5  11.3  10.4   Hemoglobin 11.7 - 15.5 g/dL 13.1  13.2  12.7   Hematocrit 35.0 - 45.0 % 40.7  39.6  38.1   Platelets 140 - 400 Thousand/uL 567  133  109     Lab Results  Component Value Date   HGBA1C 7.8 (H) 05/08/2022   Lab Results  Component Value Date   TSH 4.14 05/25/2020    ================================================== I spent  a total of *** minutes with the patient spent in direct patient consultation.  Additional time spent with chart review  / charting (studies, outside notes, etc):  *** min Total Time: *** min  Current medicines are reviewed at length with the patient today.  (+/- concerns) ***  Notice: This dictation was prepared with Dragon dictation along with smart phrase technology. Any transcriptional errors that result from this process are unintentional and may not be corrected upon review.   Studies Ordered:  No orders of the defined types were placed in this encounter.  No orders of the defined types were placed in this encounter.   Patient Instructions / Medication Changes & Studies & Tests Ordered   There are no Patient Instructions on file for this visit.    Leonie Man, MD, MS Glenetta Hew, M.D., M.S. Interventional Cardiologist  [facility]  Pager # [phone] Phone # [phone] [address] [place], [place] [address]   Thank you for choosing [facility]!!

## 2022-06-09 ENCOUNTER — Ambulatory Visit: Payer: Medicare PPO | Admitting: Cardiology

## 2022-06-09 DIAGNOSIS — E1140 Type 2 diabetes mellitus with diabetic neuropathy, unspecified: Secondary | ICD-10-CM

## 2022-06-09 DIAGNOSIS — E119 Type 2 diabetes mellitus without complications: Secondary | ICD-10-CM | POA: Diagnosis not present

## 2022-06-09 DIAGNOSIS — Z0100 Encounter for examination of eyes and vision without abnormal findings: Secondary | ICD-10-CM | POA: Diagnosis not present

## 2022-06-09 DIAGNOSIS — G4733 Obstructive sleep apnea (adult) (pediatric): Secondary | ICD-10-CM

## 2022-06-09 DIAGNOSIS — H2513 Age-related nuclear cataract, bilateral: Secondary | ICD-10-CM | POA: Diagnosis not present

## 2022-06-09 LAB — HM DIABETES EYE EXAM

## 2022-06-14 ENCOUNTER — Emergency Department (HOSPITAL_BASED_OUTPATIENT_CLINIC_OR_DEPARTMENT_OTHER)
Admission: EM | Admit: 2022-06-14 | Discharge: 2022-06-14 | Disposition: A | Discharge status: Home / Self Care | Payer: Medicare PPO | Attending: Emergency Medicine | Admitting: Emergency Medicine

## 2022-06-14 ENCOUNTER — Other Ambulatory Visit: Payer: Self-pay

## 2022-06-14 ENCOUNTER — Encounter (HOSPITAL_BASED_OUTPATIENT_CLINIC_OR_DEPARTMENT_OTHER): Payer: Self-pay

## 2022-06-14 ENCOUNTER — Emergency Department (HOSPITAL_BASED_OUTPATIENT_CLINIC_OR_DEPARTMENT_OTHER): Payer: Medicare PPO

## 2022-06-14 ENCOUNTER — Telehealth: Payer: Self-pay | Admitting: Primary Care

## 2022-06-14 DIAGNOSIS — R319 Hematuria, unspecified: Secondary | ICD-10-CM | POA: Insufficient documentation

## 2022-06-14 DIAGNOSIS — I700 Atherosclerosis of aorta: Secondary | ICD-10-CM | POA: Diagnosis not present

## 2022-06-14 DIAGNOSIS — R944 Abnormal results of kidney function studies: Secondary | ICD-10-CM | POA: Diagnosis not present

## 2022-06-14 DIAGNOSIS — N134 Hydroureter: Secondary | ICD-10-CM | POA: Diagnosis not present

## 2022-06-14 DIAGNOSIS — R109 Unspecified abdominal pain: Secondary | ICD-10-CM | POA: Diagnosis not present

## 2022-06-14 DIAGNOSIS — J45909 Unspecified asthma, uncomplicated: Secondary | ICD-10-CM | POA: Diagnosis not present

## 2022-06-14 DIAGNOSIS — Z794 Long term (current) use of insulin: Secondary | ICD-10-CM | POA: Diagnosis not present

## 2022-06-14 DIAGNOSIS — N1330 Unspecified hydronephrosis: Secondary | ICD-10-CM | POA: Diagnosis not present

## 2022-06-14 DIAGNOSIS — I10 Essential (primary) hypertension: Secondary | ICD-10-CM | POA: Insufficient documentation

## 2022-06-14 DIAGNOSIS — E039 Hypothyroidism, unspecified: Secondary | ICD-10-CM | POA: Insufficient documentation

## 2022-06-14 DIAGNOSIS — N200 Calculus of kidney: Secondary | ICD-10-CM | POA: Diagnosis not present

## 2022-06-14 DIAGNOSIS — Z79899 Other long term (current) drug therapy: Secondary | ICD-10-CM | POA: Diagnosis not present

## 2022-06-14 DIAGNOSIS — R1032 Left lower quadrant pain: Secondary | ICD-10-CM

## 2022-06-14 DIAGNOSIS — E119 Type 2 diabetes mellitus without complications: Secondary | ICD-10-CM | POA: Insufficient documentation

## 2022-06-14 LAB — COMPREHENSIVE METABOLIC PANEL
ALT: 25 U/L (ref 0–44)
AST: 29 U/L (ref 15–41)
Albumin: 3.4 g/dL — ABNORMAL LOW (ref 3.5–5.0)
Alkaline Phosphatase: 50 U/L (ref 38–126)
Anion gap: 11 (ref 5–15)
BUN: 21 mg/dL — ABNORMAL HIGH (ref 6–20)
CO2: 26 mmol/L (ref 22–32)
Calcium: 9.5 mg/dL (ref 8.9–10.3)
Chloride: 103 mmol/L (ref 98–111)
Creatinine, Ser: 1.13 mg/dL — ABNORMAL HIGH (ref 0.44–1.00)
GFR, Estimated: 57 mL/min — ABNORMAL LOW (ref >60)
Glucose, Bld: 171 mg/dL — ABNORMAL HIGH (ref 70–99)
Potassium: 4.1 mmol/L (ref 3.5–5.1)
Sodium: 140 mmol/L (ref 135–145)
Total Bilirubin: 0.5 mg/dL (ref 0.3–1.2)
Total Protein: 6.8 g/dL (ref 6.5–8.1)

## 2022-06-14 LAB — CBC
HCT: 42.2 % (ref 36.0–46.0)
Hemoglobin: 13.3 g/dL (ref 12.0–15.0)
MCH: 29 pg (ref 26.0–34.0)
MCHC: 31.5 g/dL (ref 30.0–36.0)
MCV: 91.9 fL (ref 80.0–100.0)
Platelets: 238 10*3/uL (ref 150–400)
RBC: 4.59 MIL/uL (ref 3.87–5.11)
RDW: 14.8 % (ref 11.5–15.5)
WBC: 9.6 10*3/uL (ref 4.0–10.5)
nRBC: 0 % (ref 0.0–0.2)

## 2022-06-14 LAB — URINALYSIS, W/ REFLEX TO CULTURE (INFECTION SUSPECTED)
Bacteria, UA: NONE SEEN
Bilirubin Urine: NEGATIVE
Glucose, UA: NEGATIVE mg/dL
Ketones, ur: NEGATIVE mg/dL
Leukocytes,Ua: NEGATIVE
Nitrite: NEGATIVE
Protein, ur: NEGATIVE mg/dL
Specific Gravity, Urine: 1.022 (ref 1.005–1.030)
pH: 5.5 (ref 5.0–8.0)

## 2022-06-14 LAB — LIPASE, BLOOD: Lipase: 18 U/L (ref 11–51)

## 2022-06-14 MED ORDER — ONDANSETRON HCL 4 MG/2ML IJ SOLN
4.0000 mg | Freq: Once | INTRAMUSCULAR | Status: AC
  Administered 2022-06-14: 4 mg via INTRAVENOUS
  Filled 2022-06-14: qty 2

## 2022-06-14 MED ORDER — SODIUM CHLORIDE 0.9 % IV BOLUS
500.0000 mL | Freq: Once | INTRAVENOUS | Status: AC
  Administered 2022-06-14: 500 mL via INTRAVENOUS

## 2022-06-14 MED ORDER — MORPHINE SULFATE (PF) 4 MG/ML IV SOLN
4.0000 mg | Freq: Once | INTRAVENOUS | Status: AC
  Administered 2022-06-14: 4 mg via INTRAVENOUS
  Filled 2022-06-14: qty 1

## 2022-06-14 MED ORDER — IOHEXOL 300 MG/ML  SOLN
100.0000 mL | Freq: Once | INTRAMUSCULAR | Status: AC | PRN
  Administered 2022-06-14: 100 mL via INTRAVENOUS

## 2022-06-14 NOTE — Telephone Encounter (Signed)
I spoke with pt and she is going to [facility] ED via her daughter taking pt by car. Sending note to Gentry Fitz NP and Carlis Abbott pool.

## 2022-06-14 NOTE — Telephone Encounter (Signed)
[facility] - Client TELEPHONE ADVICE RECORD [facility] Patient Name: [name] Gender: Female DOB: [birth year] Age: 57 Y 11 M 25 D Return Phone Number: [phone] (Primary) Address: City/ State/ Zip: [place] [address] Client [facility] - Client Client Site [facility] - Day Provider [name] - NP Contact Type Call Who Is Calling Patient / Member / Family / Caregiver Call Type Triage / Clinical Relationship To Patient Self Return Phone Number [phone] (Primary) Chief Complaint SEVERE ABDOMINAL PAIN - Severe pain in abdomen Reason for Call Symptomatic / Request for Health Information Initial Comment Caller states [name] with [facility]. The pt is having a lot lower left abdomen pain She tried to eat some crackers and threw up instantly. She just got out of the hospital for a sepsis infection. Translation No Nurse Assessment Nurse: Maria Barry, RN, [name] Date/Time Eilene Ghazi Time): 06/14/2022 11:54:01 AM Confirm and document reason for call. If symptomatic, describe symptoms. ---The pt is having a lot lower left abdomen pain She tried to eat some crackers and threw up instantly. She just got out of the hospital for a sepsis infection. Abdominal pain started this am. No temp. Does the patient have any new or worsening symptoms? ---Yes Will a triage be completed? ---Yes Related visit to physician within the last 2 weeks? ---No Does the PT have any chronic conditions? (i.e. diabetes, asthma, this includes High risk factors for pregnancy, etc.) ---Yes List chronic conditions. ---Diabetes HTN recent hospitalization , kidney stone , e coli in blood stream, sepsis Is this a behavioral health or substance abuse call? ---No Guidelines Guideline Title Affirmed Question Affirmed Notes Nurse Date/Time (Eastern Time) Abdominal Pain - Female [1] SEVERE pain (e.g., excruciating) AND [2]  present > 1 hour Deaton, RN, [name] 06/14/2022 11:55:48 AM PLEASE NOTE: All timestamps contained within this report are represented as [place] Standard Time. CONFIDENTIALTY NOTICE: This fax transmission is intended only for the addressee. It contains information that is legally privileged, confidential or otherwise protected from use or disclosure. If you are not the intended recipient, you are strictly prohibited from reviewing, disclosing, copying using or disseminating any of this information or taking any action in reliance on or regarding this information. If you have received this fax in error, please notify us immediately by telephone so that we can arrange for its return to [place]. Phone: [phone], Toll-Free: [phone], Fax: [phone] Page: 2 of 2 Call Id: [id number] [place]. Time Eilene Ghazi Time) Disposition Final User 06/14/2022 11:49:25 AM Send to Urgent Queue [name], [name] 06/14/2022 11:57:37 AM Go to ED Now Yes Maria Barry, RN, [name] Final Disposition 06/14/2022 11:57:37 AM Go to ED Now Yes Deaton, RN, [name] Disagree/Comply Comply Caller Understands Yes PreDisposition Did not know what to do Care Advice Given Per Guideline GO TO ED NOW: * You need to be seen in the Emergency Department. * Go to the ED at ___________ [place] now. Drive carefully. ANOTHER ADULT SHOULD DRIVE: * It is better and safer if another adult drives instead of you. NOTHING BY MOUTH: * Do not eat or drink anything for now. * Reason: Condition may need surgery and general anesthesia. CARE ADVICE given per Abdominal Pain - Female (Adult) guideline. Comments User: Maria Danker, RN Date/Time Eilene Ghazi Time): 06/14/2022 11:58:18 AM Caller is going to have her daughter drive her to the ED by POV NOW. Referrals [facility] - E

## 2022-06-14 NOTE — Discharge Instructions (Addendum)
You were seen today for pain in your left lower abdomen.  Your labs and CT scan are overall reassuring but shows that you likely recently passed a kidney stone.  Follow-up with primary care and urology.  Come back for new or worsening symptoms.  You CT also showed some incidental findings including degenerative changes in your lumbar spine, possible fatty liver changes, and atherosclerosis of your aorta. Follow up with your PCP regarding these findings.

## 2022-06-14 NOTE — ED Triage Notes (Signed)
Onset this am of left lower abd. Pain with vomiting.

## 2022-06-14 NOTE — Telephone Encounter (Signed)
Patient called in stating that she is experiencing severe left lower abdomen pain,she attempted to eat some crackers that she threw up instantly. I sent her to access nurse to be triaged.

## 2022-06-14 NOTE — ED Provider Notes (Signed)
[facility] Provider Note   CSN: [id number] Arrival date & time: 06/14/22  1256     History  Chief Complaint  Patient presents with   Abdominal Pain    RYDIA NEAULT is a 57 y.o. female.  History of diabetes, asthma, hypertension, hypothyroidism and sleep apnea.  She presents the ER today complaining of left lower quadrant abdominal pain that started suddenly at 7 AM.  She notes that she was recently hospital for bilateral kidney stones with an infection and was septic at that time.  She was discharged on 05/13/2022.  When she called her PCP today about her abdominal pain they told her to come to the ED due to recent hospital admission. Patient reports she has had history of diverticulitis in the past but this feels more severe and does not go with a heating pad like her diverticulitis. Denies fevers, chills, nausea or vomiting.   Abdominal Pain      Home Medications Prior to Admission medications   Medication Sig Start Date End Date Taking? Authorizing Provider  albuterol (PROVENTIL) (2.5 MG/3ML) 0.083% nebulizer solution Take 3 mLs (2.5 mg total) by nebulization every 6 (six) hours as needed for wheezing or shortness of breath. Patient not taking: Reported on 05/24/2022 01/12/22   Tonia Ghent, MD  albuterol (VENTOLIN HFA) 108 (90 Base) MCG/ACT inhaler Inhale 1-2 puffs into the lungs every 6 (six) hours as needed for wheezing or shortness of breath. 01/12/22   Tonia Ghent, MD  atorvastatin (LIPITOR) 40 MG tablet Take 1 tablet (40 mg total) by mouth daily. for cholesterol. 08/12/21   Pleas Koch, NP  Bacillus Coagulans-Inulin (PROBIOTIC-PREBIOTIC PO) Take by mouth.    [provider]  Continuous Blood Gluc Receiver ([place]) [place] by Does not apply route.    [provider]  Continuous Blood Gluc Sensor ([place]) MISC by Does not apply route.    [provider]  cyclobenzaprine  (FLEXERIL) 10 MG tablet TAKE ONE TABLET BY MOUTH THREE TIMES DAILY AS NEEDED FOR MUSCLE SPASMS 05/27/22   Pleas Koch, NP  diphenhydramine-acetaminophen (TYLENOL PM) 25-500 MG TABS tablet Take 1 tablet by mouth at bedtime.    [provider]  Dulaglutide 4.5 MG/0.5ML SOPN Inject into the skin. 05/18/20   [provider]  EPINEPHrine (EPIPEN 2-PAK) 0.3 mg/0.3 mL IJ SOAJ injection Inject 0.3 mg into the muscle as needed for anaphylaxis. 05/14/22   Elodia Florence., MD  ergocalciferol (VITAMIN D2) 1.25 MG (50000 UT) capsule Take by mouth. 11/14/18   [provider]  fexofenadine (ALLEGRA) 180 MG tablet 1 tablet    [provider]  FLUoxetine (PROZAC) 40 MG capsule TAKE 1 CAPSULE BY MOUTH ONCE DAILY FOR ANXIETY AND DEPRESSION 06/19/21   Pleas Koch, NP  fluticasone (FLONASE) 50 MCG/ACT nasal spray Place 1 spray into both nostrils 2 (two) times daily. 05/25/20   Pleas Koch, NP  gabapentin (NEURONTIN) 600 MG tablet Take 600 mg by mouth 2 (two) times daily.    [provider]  hydrochlorothiazide (HYDRODIURIL) 12.5 MG tablet Take 1 tablet (12.5 mg total) by mouth daily. for blood pressure. 05/15/22   Elodia Florence., MD  insulin aspart (NOVOLOG FLEXPEN) 100 UNIT/ML FlexPen Inject 22-26 Units into the skin 3 (three) times daily with meals. 12/01/19   Pleas Koch, NP  insulin degludec (TRESIBA) 100 UNIT/ML FlexTouch Pen Inject 40 Units into the skin 2 (two)  times daily. 01/12/22   Tonia Ghent, MD  Insulin Pen Needle 32G X 4 MM MISC Use 1 needle with pen as directed 09/20/19   Pleas Koch, NP  levocetirizine (XYZAL) 5 MG tablet TAKE ONE TABLET BY MOUTH EVERY EVENING 07/16/17   Pleas Koch, NP  losartan (COZAAR) 50 MG tablet Take 1 tablet (50 mg total) by mouth daily. for blood pressure 05/15/22   Elodia Florence., MD  metoprolol tartrate (LOPRESSOR) 25 MG tablet TAKE 0.5 TABLET BY MOUTH TWICE DAILY FOR BLOOD PRESSURE  06/02/22   Pleas Koch, NP  Multiple Vitamin (MULTIVITAMIN PO) Take 1 tablet by mouth daily.     [provider]  Nebulizer MISC Dispense 1 nebulizer for home use, with tubing and supplies. J45.909. 01/12/22   Tonia Ghent, MD  ondansetron (ZOFRAN-ODT) 4 MG disintegrating tablet TAKE 1 TABLET BY MOUTH EVERY 8 HOURS AS NEEDED FOR NAUSEA AS DIRECTED. 04/16/22   Pleas Koch, NP  VALERIAN ROOT PO Take 1,000 mg by mouth at bedtime.    [provider]      Allergies    Amoxicillin, Bee venom, Levofloxacin, Meloxicam, Vancomycin, Sulfa antibiotics, Ace inhibitors, Ciprofloxacin, Codeine, Meperidine hcl, Oxycodone-acetaminophen, Tyloxapol, Penicillins, and Tequin [gatifloxacin]    Review of Systems   Review of Systems  Gastrointestinal:  Positive for abdominal pain.    Physical Exam Updated Vital Signs BP (!) 155/64   Pulse 82   Temp 97.9 F (36.6 C) (Oral)   Resp 16   Ht 5\' 7"  (1.702 m)   Wt (!) 150.1 kg   SpO2 93%   BMI 51.84 kg/m  Physical Exam Vitals and nursing note reviewed.  Constitutional:      General: She is not in acute distress.    Appearance: She is well-developed.  HENT:     Head: Normocephalic and atraumatic.  Eyes:     Conjunctiva/sclera: Conjunctivae normal.  Cardiovascular:     Rate and Rhythm: Normal rate and regular rhythm.     Heart sounds: No murmur heard. Pulmonary:     Effort: Pulmonary effort is normal. No respiratory distress.     Breath sounds: Normal breath sounds.  Abdominal:     Palpations: Abdomen is soft.     Tenderness: There is abdominal tenderness in the left lower quadrant.     Comments: Exam is difficult due to body habitus  Musculoskeletal:        General: No swelling.     Cervical back: Neck supple.  Skin:    General: Skin is warm and dry.     Capillary Refill: Capillary refill takes less than 2 seconds.  Neurological:     Mental Status: She is alert.  Psychiatric:        Mood and Affect: Mood  normal.     ED Results / Procedures / Treatments   Labs (all labs ordered are listed, but only abnormal results are displayed) Labs Reviewed  COMPREHENSIVE METABOLIC PANEL - Abnormal; Notable for the following components:      Result Value   Glucose, Bld 171 (*)    BUN 21 (*)    Creatinine, Ser 1.13 (*)    Albumin 3.4 (*)    GFR, Estimated 57 (*)    All other components within normal limits  URINALYSIS, W/ REFLEX TO CULTURE (INFECTION SUSPECTED) - Abnormal; Notable for the following components:   Hgb urine dipstick MODERATE (*)    All other components within normal limits  LIPASE,  BLOOD  CBC    EKG None  Radiology CT ABDOMEN PELVIS W CONTRAST  Result Date: 06/14/2022 CLINICAL DATA:  Left lower quadrant pain. EXAM: CT ABDOMEN AND PELVIS WITH CONTRAST TECHNIQUE: Multidetector CT imaging of the abdomen and pelvis was performed using the standard protocol following bolus administration of intravenous contrast. RADIATION DOSE REDUCTION: This exam was performed according to the departmental dose-optimization program which includes automated exposure control, adjustment of the mA and/or kV according to patient size and/or use of iterative reconstruction technique. CONTRAST:  151mL OMNIPAQUE IOHEXOL 300 MG/ML  SOLN COMPARISON:  May 09, 2022 FINDINGS: Lower chest: No acute abnormality. Hepatobiliary: A stable 5.3 cm x 1.4 cm area of parenchymal low attenuation (approximately 33.17 Hounsfield units) is seen within the posterior aspect of the left lobe of the liver. A similar appearing area of parenchymal low attenuation is seen within the caudate lobe of the liver. Status post cholecystectomy. No biliary dilatation. Pancreas: Unremarkable. No pancreatic ductal dilatation or surrounding inflammatory changes. Spleen: Normal in size without focal abnormality. Adrenals/Urinary Tract: Adrenal glands are unremarkable. Kidneys are normal in size, without focal lesions. Mild left-sided hydronephrosis  and hydroureter are seen without evidence of obstructing renal calculi. There is stable mild to moderate severity nonspecific bilateral perinephric inflammatory fat stranding. Mild left-sided peripelvic and proximal periureteral inflammatory fat stranding is also noted. Bladder is unremarkable. Stomach/Bowel: There is evidence of prior gastric banding surgery. Stomach is otherwise within normal limits. The appendix is not visualized. No evidence of bowel wall thickening, distention, or inflammatory changes. Vascular/Lymphatic: Aortic atherosclerosis. No enlarged abdominal or pelvic lymph nodes. Reproductive: Status post hysterectomy. No adnexal masses. Other: No abdominal wall hernia or abnormality. No abdominopelvic ascites. Musculoskeletal: Moderate to marked severity degenerative changes seen at the levels of L4-L5 and L5-S1. IMPRESSION: 1. Findings which may represent sequelae associated with a recently passed left renal calculus. Correlation with urinalysis is recommended, as acute pyelonephritis cannot be excluded. 2. Stable areas of parenchymal low attenuation within the liver which may represent areas of focal fatty infiltration. 3. Evidence of prior gastric banding surgery. 4. Moderate to marked severity degenerative changes at the levels of L4-L5 and L5-S1. 5. Aortic atherosclerosis. Aortic Atherosclerosis (ICD10-I70.0). Electronically Signed   By: Virgina Norfolk M.D.   On: 06/14/2022 16:16    Procedures Procedures    Medications Ordered in ED Medications  morphine (PF) 4 MG/ML injection 4 mg (4 mg Intravenous Given 06/14/22 1355)  ondansetron (ZOFRAN) injection 4 mg (4 mg Intravenous Given 06/14/22 1355)  sodium chloride 0.9 % bolus 500 mL (500 mLs Intravenous New Bag/Given 06/14/22 1535)  iohexol (OMNIPAQUE) 300 MG/ML solution 100 mL (100 mLs Intravenous Contrast Given 06/14/22 1559)    ED Course/ Medical Decision Making/ A&P                             Medical Decision Making This  patient presents to the ED for concern of abdominal pain, this involves an extensive number of treatment options, and is a complaint that carries with it a high risk of complications and morbidity.  The differential diagnosis includes UTI, kidney stone, cholecystitis, cholelithiasis, diverticulitis, gastritis, peptic ulcer disease, muscle strain, UTI, ovarian cyst, ovarian torsion, other   Co morbidities that complicate the patient evaluation  Obesity, kidney stones, diabetes   Additional history obtained:  Additional history obtained from EMR External records from outside source obtained and reviewed including recent discharge summary   Lab Tests:  I Ordered, and personally interpreted labs.  The pertinent results include:   hematuria on urinalysis, mild elevation of creatinine, blood glucose 171, CBC does not show leukocytosis or anemia    Imaging Studies ordered:  I ordered imaging studies including CT abdomen pelvis sequela of recently passed ureteral stone on the left, also notes aortic atherosclerosis, lumbar degenerative disc disease and some focal fatty infiltration of liver      Problem List / ED Course / Critical interventions / Medication management  He has sharp left lower quadrant pain.  She feeling better after morphine and Zofran and fluids.  CT showed sequela of a recently passed stone.  UA did show blood and no signs of infection.  Symptoms are completely resolved patient sitting in bed, well-appearing.  Discussed that she likely had a stone that she had passed.  Follow-up with urology.  Discussed incidental findings of her CT scan.  She notes regarding the aortic atherosclerosis she is following up with cardiology soon Reevaluation of the patient after these medicines showed that the patient resolved I have reviewed the patients home medicines and have made adjustments as needed     Amount and/or Complexity of Data Reviewed Labs:  ordered.           Final Clinical Impression(s) / ED Diagnoses Final diagnoses:  Left lower quadrant abdominal pain  Abdominal aortic atherosclerosis [facility])    Rx / DC Orders ED Discharge Orders     None         Darci Current 06/14/22 1722    Blanchie Dessert, MD 06/15/22 2111

## 2022-06-14 NOTE — Telephone Encounter (Signed)
Noted and agree with disposition. 

## 2022-06-15 ENCOUNTER — Other Ambulatory Visit: Payer: Self-pay | Admitting: Primary Care

## 2022-06-15 DIAGNOSIS — I10 Essential (primary) hypertension: Secondary | ICD-10-CM

## 2022-06-19 ENCOUNTER — Telehealth: Payer: Self-pay

## 2022-06-19 NOTE — Telephone Encounter (Signed)
     Patient  visit on 3/27  at [facility]    Have you been able to follow up with your primary care physician? No   The patient was or was not able to obtain any needed medicine or equipment. Na   Are there diet recommendations that you are having difficulty following? Na   Patient expresses understanding of discharge instructions and education provided has no other needs at this time.  Yes      [facility] [phone] [address], [place], [place] [address] Phone: [phone] Email: [email or web address]

## 2022-06-20 ENCOUNTER — Telehealth: Payer: Self-pay | Admitting: Pharmacist

## 2022-06-20 DIAGNOSIS — I4729 Other ventricular tachycardia: Secondary | ICD-10-CM | POA: Diagnosis not present

## 2022-06-20 DIAGNOSIS — I358 Other nonrheumatic aortic valve disorders: Secondary | ICD-10-CM | POA: Diagnosis not present

## 2022-06-20 DIAGNOSIS — E1165 Type 2 diabetes mellitus with hyperglycemia: Secondary | ICD-10-CM | POA: Diagnosis not present

## 2022-06-20 DIAGNOSIS — J45909 Unspecified asthma, uncomplicated: Secondary | ICD-10-CM | POA: Diagnosis not present

## 2022-06-20 DIAGNOSIS — N131 Hydronephrosis with ureteral stricture, not elsewhere classified: Secondary | ICD-10-CM | POA: Diagnosis not present

## 2022-06-20 DIAGNOSIS — I110 Hypertensive heart disease with heart failure: Secondary | ICD-10-CM | POA: Diagnosis not present

## 2022-06-20 DIAGNOSIS — I509 Heart failure, unspecified: Secondary | ICD-10-CM | POA: Diagnosis not present

## 2022-06-20 DIAGNOSIS — N179 Acute kidney failure, unspecified: Secondary | ICD-10-CM | POA: Diagnosis not present

## 2022-06-20 DIAGNOSIS — J9602 Acute respiratory failure with hypercapnia: Secondary | ICD-10-CM | POA: Diagnosis not present

## 2022-06-20 NOTE — Telephone Encounter (Signed)
Noted. Added medication note.

## 2022-06-20 NOTE — Telephone Encounter (Signed)
Atorvastatin 40 mg adherence reviewed, last fill date verified with pharmacy was 08/12/21 for 90 day supply.  Will contact patient to assess for side effects, taking differently, or other reason for nonadherence.

## 2022-06-20 NOTE — Telephone Encounter (Cosign Needed)
[facility] verified fill date of 08/12/2021 90ds for Atorvastatin 40 mg. I spoke with patient. She stated her husband had the same Rx and found out he could not take it due to side effects. Patient reported taking her husbands until that runs out. Patient did verify she has been taking it as prescribed.   Charlene Brooke, PharmD notified  [name], [place] Clinical Pharmacy Assistant [phone]

## 2022-06-21 ENCOUNTER — Other Ambulatory Visit: Payer: Medicare PPO

## 2022-06-27 DIAGNOSIS — J45909 Unspecified asthma, uncomplicated: Secondary | ICD-10-CM | POA: Diagnosis not present

## 2022-06-27 DIAGNOSIS — E1165 Type 2 diabetes mellitus with hyperglycemia: Secondary | ICD-10-CM | POA: Diagnosis not present

## 2022-06-27 DIAGNOSIS — I358 Other nonrheumatic aortic valve disorders: Secondary | ICD-10-CM | POA: Diagnosis not present

## 2022-06-27 DIAGNOSIS — J9602 Acute respiratory failure with hypercapnia: Secondary | ICD-10-CM | POA: Diagnosis not present

## 2022-06-27 DIAGNOSIS — N179 Acute kidney failure, unspecified: Secondary | ICD-10-CM | POA: Diagnosis not present

## 2022-06-27 DIAGNOSIS — I110 Hypertensive heart disease with heart failure: Secondary | ICD-10-CM | POA: Diagnosis not present

## 2022-06-27 DIAGNOSIS — I509 Heart failure, unspecified: Secondary | ICD-10-CM | POA: Diagnosis not present

## 2022-06-27 DIAGNOSIS — N131 Hydronephrosis with ureteral stricture, not elsewhere classified: Secondary | ICD-10-CM | POA: Diagnosis not present

## 2022-06-27 DIAGNOSIS — I4729 Other ventricular tachycardia: Secondary | ICD-10-CM | POA: Diagnosis not present

## 2022-06-29 ENCOUNTER — Telehealth: Payer: Self-pay

## 2022-06-29 NOTE — Telephone Encounter (Signed)
Spoke to patient and verified that she is not wearing cpap currently. Sleep study >10y.

## 2022-06-30 ENCOUNTER — Encounter: Payer: Self-pay | Admitting: Adult Health

## 2022-06-30 ENCOUNTER — Ambulatory Visit: Payer: Medicare PPO | Admitting: Adult Health

## 2022-06-30 VITALS — BP 124/62 | HR 94 | Temp 97.9°F | Ht 67.0 in | Wt 331.0 lb

## 2022-06-30 DIAGNOSIS — G4733 Obstructive sleep apnea (adult) (pediatric): Secondary | ICD-10-CM

## 2022-06-30 NOTE — Patient Instructions (Signed)
Set up for split night sleep study  Work on healthy weight loss .  Do not drive if sleepy  Follow up in 4-6 weeks to discuss sleep study results and treatment plan

## 2022-06-30 NOTE — Progress Notes (Signed)
Reviewed and agree with assessment/plan.   Laszlo Ellerby, MD [facility] 06/30/2022, 4:39 PM Pager:  [phone]  

## 2022-06-30 NOTE — Progress Notes (Signed)
@Patient  ID: [name], female    DOB: Apr 24, 1965, 57 y.o.   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   TEST/EVENTS :   06/30/2022 Sleep consult  Patient presents for a sleep consult today to establish for sleep apnea and OHS.  Patient says that she was diagnosed with sleep apnea greater than 20 years ago was started on CPAP which she wore for about 3 years.  She says that she stopped wearing after she has significant weight loss from Lap-Band surgery.  She lost over 125 pounds.  Her symptoms resolved and her snoring went away.  Approximately 5 years ago patient started getting a progressive weakness especially in her legs to the point where she was unable to walk.  Patient is was initially seen at [facility] neurology and then went to [facility] neurology.  She was initially diagnosed with ALS however was changed diagnosis after 2 years of stability.  Patient has underwent physical therapy.  Is felt have a component of possible weakness from diabetic polyneuropathy, entrapment neuropathies diabetic amyotrophy or even possible complication of bariatric surgery.  Patient says that she is in the majority of time is in a wheelchair.  She can walk short distances with a walker.  Has significant weakness in her legs with foot drop.  Also has arm weakness but not as severe as in her legs.  Over the last few years patient has been on a trilogy vent at night for sleep apnea and hypoventilation.  She was receiving this through the [facility] and when her ALS diagnosis was taken  away they no longer paid for this noninvasive vent.  She has been off of it since November.  Says that she has significant snoring, restless sleep and daytime sleepiness.  Felt much better while wearing the nighttime vent.  She typically goes to sleep about 10 PM.  Takes up to an hour to go to sleep.  Is up a few times throughout the night.  Gets up at 7 or 8 AM.  Patient's weight has trended back up she is currently at 331 pounds with a BMI of 51.  Her top weight was around 375 pounds.  She got down as low as 250 pounds.  She does not use any sleep aids.  Does not take any naps.  Drinks about 4-5 sodas a day.  No symptoms suspicious for cataplexy or sleep paralysis.  Uses a fullface mask. Epworth score is 13 out of 24.  Typically gets sleepy if she sits down to rest or read and watch TV.  And in the evening hours.  Social history patient is married.  She lives with her spouse.  She is retired from Audiological scientist at [employer].  She is currently disabled to her generalized weakness.  She has adult children.  She is a never smoker.  Rare alcohol.  No drug use.  Family history positive for emphysema, allergies, asthma, heart disease, cancer, pulmonary fibrosis.  Past Surgical History:  Procedure Laterality Date   abcess removal  03/21/1995   MRSA   ABDOMINAL HYSTERECTOMY  03/21/1995   complete in 1997, partial  was in 1995   carpal tunnel release r  07/03/2013   Dalldorf   CESAREAN SECTION  1987  and 1989   CHOLECYSTECTOMY  03/20/1989   COLONOSCOPY  03/21/2011   Normal.  [facility].   KNEE SURGERY  03/20/2004   LAPAROSCOPIC GASTRIC BANDING  05/18/2009   SPINE SURGERY     TONSILLECTOMY AND ADENOIDECTOMY  03/20/1972   TUBAL LIGATION     ULNAR TUNNEL RELEASE Right 02/05/2018   Procedure: CUBITAL TUNNEL RELEASE/DECOMPRESSION;  Surgeon: Marcene Corning, MD;  Location: [facility] OR;  Service: Orthopedics;  Laterality: Right;     Allergies  Allergen Reactions   Amoxicillin Shortness Of Breath and Rash     Patient states she was previously able to tolerate Keflex without issue    Bee Venom Anaphylaxis   Levofloxacin Shortness Of Breath and Rash   Meloxicam Hives and Rash   Vancomycin Anaphylaxis   Sulfa Antibiotics Rash   Ace Inhibitors Cough   Ciprofloxacin Hives   Codeine    Meperidine Hcl    Oxycodone-Acetaminophen    Tyloxapol     Other reaction(s): vomiting/rash   Penicillins Swelling and Rash    Has patient had a PCN reaction causing immediate rash, facial/tongue/throat swelling, SOB or lightheadedness with hypotension: Yes Has patient had a PCN reaction causing severe rash involving mucus membranes or skin necrosis: Yes Has patient had a PCN reaction that required hospitalization: No Has patient had a PCN reaction occurring within the last 10 years: No If all of the above answers are "NO", then may proceed with Cephalosporin use.    Tequin [Gatifloxacin] Rash    Immunization History  Administered Date(s) Administered   Hepatitis B, ADULT 10/28/2012, 12/19/2012, 04/29/2013   Influenza Split 12/24/2010, 12/18/2011   Influenza,inj,Quad PF,6+ Mos 12/19/2012, 01/12/2014, 12/28/2014, 01/10/2016, 02/15/2017, 01/10/2018, 01/16/2019, 12/31/2019, 01/21/2021   Influenza-Unspecified 02/15/2017   Moderna Sars-Covid-2 Vaccination 05/12/2019, 06/10/2019, 11/25/2019   Pneumococcal Polysaccharide-23 08/22/2016   Pneumococcal-Unspecified 12/18/2005   Tdap 07/16/2009, 07/27/2020    Past Medical History:  Diagnosis Date   Allergy    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   Anemia    Anxiety    Arthritis    Asthma    Bacteremia 05/09/2022   Cataract    CHF (congestive heart failure)    Colon polyp    Complication of anesthesia    Cystitis with hematuria 10/24/2021   Diabetes mellitus    Displaced fracture of proximal end of right fibula 07/21/2017   Edema    Fatigue    Hypertension    IBS (irritable bowel syndrome)    Lump in female breast    Methicillin resistant  Staphylococcus aureus in conditions classified elsewhere and of unspecified site    Migraines    Morbid obesity    Neuromuscular disorder    Night sweats    Nonspecific abnormal results of thyroid function study    Other B-complex deficiencies    Paresthesias    PONV (postoperative nausea and vomiting)    Pure hypercholesterolemia    Reflux    Sacral fracture    Sinus problem    Sleep apnea    Sore on toe 05/17/2021   Symptomatic states associated with artificial menopause    Thyroid disease    Type II or unspecified type diabetes mellitus without mention of complication, not stated as uncontrolled    Unspecified sleep apnea     Tobacco History: Social History   Tobacco Use  Smoking Status Never  Smokeless Tobacco Never  Counseling given: Not Answered   Outpatient Medications Prior to Visit  Medication Sig Dispense Refill   albuterol (PROVENTIL) (2.5 MG/3ML) 0.083% nebulizer solution Take 3 mLs (2.5 mg total) by nebulization every 6 (six) hours as needed for wheezing or shortness of breath. 75 mL 2   albuterol (VENTOLIN HFA) 108 (90 Base) MCG/ACT inhaler Inhale 1-2 puffs into the lungs every 6 (six) hours as needed for wheezing or shortness of breath. 18 g 1   atorvastatin (LIPITOR) 40 MG tablet Take 1 tablet (40 mg total) by mouth daily. for cholesterol. 90 tablet 2   Bacillus Coagulans-Inulin (PROBIOTIC-PREBIOTIC PO) Take by mouth.     Continuous Blood Gluc Receiver (DEXCOM G7 RECEIVER) DEVI by Does not apply route.     Continuous Blood Gluc Sensor (DEXCOM G7 SENSOR) MISC by Does not apply route.     cyclobenzaprine (FLEXERIL) 10 MG tablet TAKE ONE TABLET BY MOUTH THREE TIMES DAILY AS NEEDED FOR MUSCLE SPASMS 270 tablet 0   Dulaglutide 4.5 MG/0.5ML SOPN Inject into the skin.     EPINEPHrine (EPIPEN 2-PAK) 0.3 mg/0.3 mL IJ SOAJ injection Inject 0.3 mg into the muscle as needed for anaphylaxis. 1 each 1   ergocalciferol (VITAMIN D2) 1.25 MG (50000 UT) capsule Take by mouth.      fexofenadine (ALLEGRA) 180 MG tablet 1 tablet     FLUoxetine (PROZAC) 40 MG capsule TAKE 1 CAPSULE BY MOUTH ONCE DAILY FOR ANXIETY AND DEPRESSION 90 capsule 3   fluticasone (FLONASE) 50 MCG/ACT nasal spray Place 1 spray into both nostrils 2 (two) times daily. 48 mL 1   gabapentin (NEURONTIN) 600 MG tablet Take 600 mg by mouth 2 (two) times daily.     hydrochlorothiazide (HYDRODIURIL) 12.5 MG tablet TAKE ONE TABLET BY MOUTH ONCE DAILY FOR BLOOD PRESSURE 90 tablet 0   insulin aspart (NOVOLOG FLEXPEN) 100 UNIT/ML FlexPen Inject 22-26 Units into the skin 3 (three) times daily with meals. 30 mL 5   insulin degludec (TRESIBA) 100 UNIT/ML FlexTouch Pen Inject 40 Units into the skin 2 (two) times daily.     Insulin Pen Needle 32G X 4 MM MISC Use 1 needle with pen as directed 100 each 11   levocetirizine (XYZAL) 5 MG tablet TAKE ONE TABLET BY MOUTH EVERY EVENING 90 tablet 1   losartan (COZAAR) 50 MG tablet TAKE ONE TABLET BY MOUTH ONCE DAILY FOR BLOOD PRESSURE 90 tablet 0   metoprolol tartrate (LOPRESSOR) 25 MG tablet TAKE 0.5 TABLET BY MOUTH TWICE DAILY FOR BLOOD PRESSURE 90 tablet 0   Multiple Vitamin (MULTIVITAMIN PO) Take 1 tablet by mouth daily.      Nebulizer MISC Dispense 1 nebulizer for home use, with tubing and supplies. J45.909. 1 each 0   ondansetron (ZOFRAN-ODT) 4 MG disintegrating tablet TAKE 1 TABLET BY MOUTH EVERY 8 HOURS AS NEEDED FOR NAUSEA AS DIRECTED. 20 tablet 0   VALERIAN ROOT PO Take 1,000 mg by mouth at bedtime.     diphenhydramine-acetaminophen (TYLENOL PM) 25-500 MG TABS tablet Take 1 tablet by mouth at bedtime. (Patient not taking: Reported on 06/30/2022)     Facility-Administered Medications Prior to Visit  Medication Dose Route Frequency Provider Last Rate Last Admin   silver sulfADIAZINE (SILVADENE) 1 % cream 1 Application  1 Application Topical Once Eden Emms, NP         Review of Systems:   Constitutional:   No  weight loss, night sweats,  Fevers, chills,   +fatigue, or  lassitude.  HEENT:  No headaches,  Difficulty swallowing,  Tooth/dental problems, or  Sore throat,                No sneezing, itching, ear ache, nasal congestion, post nasal drip,   CV:  No chest pain,  Orthopnea, PND, swelling in lower extremities, anasarca, dizziness, palpitations, syncope.   GI  No heartburn, indigestion, abdominal pain, nausea, vomiting, diarrhea, change in bowel habits, loss of appetite, bloody stools.   Resp: No shortness of breath with exertion or at rest.  No excess mucus, no productive cough,  No non-productive cough,  No coughing up of blood.  No change in color of mucus.  No wheezing.  No chest wall deformity  Skin: no rash or lesions.  GU: no dysuria, change in color of urine, no urgency or frequency.  No flank pain, no hematuria   MS: gen weakness    Physical Exam  BP 124/62 (BP Location: Left Arm, Cuff Size: Large)   Pulse 94   Temp 97.9 F (36.6 C) (Temporal)   Ht 5\' 7"  (1.702 m)   Wt (!) 331 lb (150.1 kg) Comment: weight is per patient,  SpO2 95%   BMI 51.84 kg/m   GEN: A/Ox3; pleasant , NAD, wc    HEENT:  [place]/AT,  EACs-clear, TMs-wnl, NOSE-clear, THROAT-clear, no lesions, no postnasal drip or exudate noted. Class 3-4 MP airway   NECK:  Supple w/ fair ROM; no JVD; normal carotid impulses w/o bruits; no thyromegaly or nodules palpated; no lymphadenopathy.    RESP  Clear  P & A; w/o, wheezes/ rales/ or rhonchi. no accessory muscle use, no dullness to percussion  CARD:  RRR, no m/r/g, tr  peripheral edema, pulses intact, no cyanosis or clubbing.  GI:   Soft & nt; nml bowel sounds; no organomegaly or masses detected.   Musco: Warm bil, no deformities or joint swelling noted.   Neuro: alert, no focal deficits noted.    Skin: Warm, no lesions or rashes    Lab Results:  CBC    BNP No results found for: "BNP"  ProBNP No results found for: "PROBNP"  Imaging:        No data to display          No results  found for: "NITRICOXIDE"      Assessment & Plan:   OSA (obstructive sleep apnea) Obstructive sleep apnea with obesity hypoventilation syndrome.  Patient also has underlying severe progressive weakness with a severe polyneuropathy.  Originally diagnosed with ALS but stability over 2 years rule this diagnosis out.  [facility] neurology notes were reviewed in detail.  Will need an in lab split-night study.  Titration portion may need BiPAP or an NIV   - discussed how weight can impact sleep and risk for sleep disordered breathing - discussed options to assist with weight loss: combination of diet modification, cardiovascular and strength training exercises   - had an extensive discussion regarding the adverse health consequences related to untreated sleep disordered breathing - specifically discussed the risks for hypertension, coronary artery disease, cardiac dysrhythmias, cerebrovascular disease, and diabetes - lifestyle modification discussed   - discussed how sleep disruption can increase risk of accidents, particularly when driving - safe driving practices were discussed    Plan  Patient Instructions  Set up for split night sleep study  Work on healthy weight loss .  Do not drive if sleepy  Follow up in 4-6 weeks to discuss sleep study results and treatment plan  Rubye Oaks, NP 06/30/2022

## 2022-06-30 NOTE — Assessment & Plan Note (Signed)
Obstructive sleep apnea with obesity hypoventilation syndrome.  Patient also has underlying severe progressive weakness with a severe polyneuropathy.  Originally diagnosed with ALS but stability over 2 years rule this diagnosis out.  [facility] neurology notes were reviewed in detail.  Will need an in lab split-night study.  Titration portion may need BiPAP or an NIV   - discussed how weight can impact sleep and risk for sleep disordered breathing - discussed options to assist with weight loss: combination of diet modification, cardiovascular and strength training exercises   - had an extensive discussion regarding the adverse health consequences related to untreated sleep disordered breathing - specifically discussed the risks for hypertension, coronary artery disease, cardiac dysrhythmias, cerebrovascular disease, and diabetes - lifestyle modification discussed   - discussed how sleep disruption can increase risk of accidents, particularly when driving - safe driving practices were discussed    Plan  Patient Instructions  Set up for split night sleep study  Work on healthy weight loss .  Do not drive if sleepy  Follow up in 4-6 weeks to discuss sleep study results and treatment plan

## 2022-07-03 DIAGNOSIS — H2511 Age-related nuclear cataract, right eye: Secondary | ICD-10-CM | POA: Diagnosis not present

## 2022-07-10 NOTE — Telephone Encounter (Signed)
Form was faxed to [facility] on 07/03/22 and I received approval from [facility] on 07/06/22

## 2022-07-11 ENCOUNTER — Ambulatory Visit: Payer: Medicare PPO | Admitting: Urology

## 2022-07-11 VITALS — BP 142/82 | HR 112 | Ht 66.5 in | Wt 311.0 lb

## 2022-07-11 DIAGNOSIS — Z87442 Personal history of urinary calculi: Secondary | ICD-10-CM | POA: Diagnosis not present

## 2022-07-11 DIAGNOSIS — N200 Calculus of kidney: Secondary | ICD-10-CM

## 2022-07-11 DIAGNOSIS — N390 Urinary tract infection, site not specified: Secondary | ICD-10-CM

## 2022-07-11 DIAGNOSIS — Z87440 Personal history of urinary (tract) infections: Secondary | ICD-10-CM

## 2022-07-11 DIAGNOSIS — Z09 Encounter for follow-up examination after completed treatment for conditions other than malignant neoplasm: Secondary | ICD-10-CM

## 2022-07-11 LAB — URINALYSIS, COMPLETE
Bilirubin, UA: NEGATIVE
Glucose, UA: NEGATIVE
Ketones, UA: NEGATIVE
Leukocytes,UA: NEGATIVE
Nitrite, UA: NEGATIVE
Specific Gravity, UA: 1.02 (ref 1.005–1.030)
Urobilinogen, Ur: 0.2 mg/dL (ref 0.2–1.0)
pH, UA: 5.5 (ref 5.0–7.5)

## 2022-07-11 LAB — MICROSCOPIC EXAMINATION

## 2022-07-11 NOTE — Progress Notes (Signed)
I, [name],acting as a scribe for Maria Scotland, MD.,have documented all relevant documentation on the behalf of Maria Scotland, MD,as directed by  Maria Scotland, MD while in the presence of Maria Scotland, MD.  07/11/2022 11:56 AM   [name] 1965/06/23 [id number]  Referring provider: Doreene Nest, NP [address] [place],  [place] [address]  Chief Complaint  Patient presents with   Establish Care   Nephrolithiasis    Follow up    HPI: 57 year-old female who presents today for an ER follow-up.   She was seen and evaluated in the emergency room on 06/14/2022 for lower left quadrant abdominal pain. She has a personal history of diverticulitis. Her urinalysis showed 21-50 red blood cells per high-powered field, but otherwise was negative. Her labs and vitals were stable. She had a CT of the abdomen, pelvis with contrast. Given her differential diagnosis, that showed some mild left-sided hydronephrosis and hydroureter with some perinephric stranding bilaterally including left peripelvic and periureteral inflammation. No stone was identified. This is felt to be possibly a sequela of a recently passed stone. Notably by comparison, she did have a renal ultrasound on 05/08/2022 that showed a punctate stone in the left upper pole.   She has personal history of septic shock secondary to E. coli bacteremia.  She reports never having any stones before although she has a family history (mother and father) of them. She is working on increasing her water intake and drinks a lot of diet Dr. Reino Kent. She takes a pre and pro-biotic with cranberry. She reports having a couple infections the last 6 months which she was being treated for. She doesn't always know she has one until the pain gets very bad, similar to an IBS attack. She is no longer taking Jardiance.   Results for orders placed or performed in visit on 07/11/22  Microscopic Examination   Urine  Result Value Ref Range   WBC,  UA 0-5 0 - 5 /hpf   RBC, Urine 0-2 0 - 2 /hpf   Epithelial Cells (non renal) 0-10 0 - 10 /hpf   Casts Present (A) None seen /lpf   Cast Type Granular casts (A) N/A   Bacteria, UA Few None seen/Few  Urinalysis, Complete  Result Value Ref Range   Specific Gravity, UA 1.020 1.005 - 1.030   pH, UA 5.5 5.0 - 7.5   Color, UA Yellow Yellow   Appearance Ur Clear Clear   Leukocytes,UA Negative Negative   Protein,UA Trace (A) Negative/Trace   Glucose, UA Negative Negative   Ketones, UA Negative Negative   RBC, UA Trace (A) Negative   Bilirubin, UA Negative Negative   Urobilinogen, Ur 0.2 0.2 - 1.0 mg/dL   Nitrite, UA Negative Negative   Microscopic Examination See below:      PMH: Past Medical History:  Diagnosis Date   Allergy    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   Anemia    Anxiety    Arthritis    Asthma    Bacteremia 05/09/2022   Cataract    CHF (congestive heart failure)    Colon polyp    Complication of anesthesia    Cystitis with hematuria 10/24/2021   Diabetes mellitus    Displaced fracture of proximal end of right fibula 07/21/2017   Edema    Fatigue    Hypertension    IBS (irritable bowel syndrome)    Lump in female breast    Methicillin resistant Staphylococcus aureus  in conditions classified elsewhere and of unspecified site    Migraines    Morbid obesity    Neuromuscular disorder    Night sweats    Nonspecific abnormal results of thyroid function study    Other B-complex deficiencies    Paresthesias    PONV (postoperative nausea and vomiting)    Pure hypercholesterolemia    Reflux    Sacral fracture    Sinus problem    Sleep apnea    Sore on toe 05/17/2021   Symptomatic states associated with artificial menopause    Thyroid disease    Type II or unspecified type diabetes mellitus without mention of complication, not stated as uncontrolled    Unspecified sleep apnea     Surgical History: Past Surgical History:  Procedure Laterality  Date   abcess removal  03/21/1995   MRSA   ABDOMINAL HYSTERECTOMY  03/21/1995   complete in 1997, partial was in 1995   carpal tunnel release r  07/03/2013   Dalldorf   CESAREAN SECTION  1987  and 1989   CHOLECYSTECTOMY  03/20/1989   COLONOSCOPY  03/21/2011   Normal.  [facility].   KNEE SURGERY  03/20/2004   LAPAROSCOPIC GASTRIC BANDING  05/18/2009   SPINE SURGERY     TONSILLECTOMY AND ADENOIDECTOMY  03/20/1972   TUBAL LIGATION     ULNAR TUNNEL RELEASE Right 02/05/2018   Procedure: CUBITAL TUNNEL RELEASE/DECOMPRESSION;  Surgeon: Marcene Corning, MD;  Location: [facility] OR;  Service: Orthopedics;  Laterality: Right;    Home Medications:  Allergies as of 07/11/2022       Reactions   Amoxicillin Shortness Of Breath, Rash   Patient states she was previously able to tolerate Keflex without issue    Bee Venom Anaphylaxis   Levofloxacin Shortness Of Breath, Rash   Meloxicam Hives, Rash   Vancomycin Anaphylaxis   Sulfa Antibiotics Rash   Ace Inhibitors Cough   Ciprofloxacin Hives   Codeine    Meperidine Hcl    Oxycodone-acetaminophen    Tyloxapol    Other reaction(s): vomiting/rash   Penicillins Swelling, Rash   Has patient had a PCN reaction causing immediate rash, facial/tongue/throat swelling, SOB or lightheadedness with hypotension: Yes Has patient had a PCN reaction causing severe rash involving mucus membranes or skin necrosis: Yes Has patient had a PCN reaction that required hospitalization: No Has patient had a PCN reaction occurring within the last 10 years: No If all of the above answers are "NO", then may proceed with Cephalosporin use.   Tequin [gatifloxacin] Rash        Medication List        Accurate as of July 11, 2022 11:56 AM. If you have any questions, ask your nurse or doctor.          acetaminophen 325 MG tablet Commonly known as: TYLENOL Take 650 mg by mouth every 6 (six) hours as needed.   albuterol (2.5 MG/3ML) 0.083% nebulizer  solution Commonly known as: PROVENTIL Take 3 mLs (2.5 mg total) by nebulization every 6 (six) hours as needed for wheezing or shortness of breath.   albuterol 108 (90 Base) MCG/ACT inhaler Commonly known as: VENTOLIN HFA Inhale 1-2 puffs into the lungs every 6 (six) hours as needed for wheezing or shortness of breath.   atorvastatin 40 MG tablet Commonly known as: LIPITOR Take 1 tablet (40 mg total) by mouth daily. for cholesterol.   cyclobenzaprine 10 MG tablet Commonly known as: FLEXERIL TAKE ONE TABLET BY MOUTH THREE TIMES DAILY AS NEEDED  FOR MUSCLE SPASMS   Dexcom G7 Receiver Devi by Does not apply route.   Dexcom G7 Sensor Misc by Does not apply route.   diphenhydramine-acetaminophen 25-500 MG Tabs tablet Commonly known as: TYLENOL PM Take 1 tablet by mouth at bedtime.   Dulaglutide 4.5 MG/0.5ML Sopn Inject into the skin.   EPINEPHrine 0.3 mg/0.3 mL Soaj injection Commonly known as: EpiPen 2-Pak Inject 0.3 mg into the muscle as needed for anaphylaxis.   ergocalciferol 1.25 MG (50000 UT) capsule Commonly known as: VITAMIN D2 Take by mouth.   fexofenadine 180 MG tablet Commonly known as: ALLEGRA 1 tablet   FLUoxetine 40 MG capsule Commonly known as: PROZAC TAKE 1 CAPSULE BY MOUTH ONCE DAILY FOR ANXIETY AND DEPRESSION   fluticasone 50 MCG/ACT nasal spray Commonly known as: FLONASE Place 1 spray into both nostrils 2 (two) times daily.   gabapentin 600 MG tablet Commonly known as: NEURONTIN Take 600 mg by mouth 2 (two) times daily.   hydrochlorothiazide 12.5 MG tablet Commonly known as: HYDRODIURIL TAKE ONE TABLET BY MOUTH ONCE DAILY FOR BLOOD PRESSURE   insulin degludec 100 UNIT/ML FlexTouch Pen Commonly known as: TRESIBA Inject 40 Units into the skin 2 (two) times daily.   Insulin Pen Needle 32G X 4 MM Misc Use 1 needle with pen as directed   levocetirizine 5 MG tablet Commonly known as: XYZAL TAKE ONE TABLET BY MOUTH EVERY EVENING   losartan 50  MG tablet Commonly known as: COZAAR TAKE ONE TABLET BY MOUTH ONCE DAILY FOR BLOOD PRESSURE   metoprolol tartrate 25 MG tablet Commonly known as: LOPRESSOR TAKE 0.5 TABLET BY MOUTH TWICE DAILY FOR BLOOD PRESSURE   MULTIVITAMIN PO Take 1 tablet by mouth daily.   Nebulizer Misc Dispense 1 nebulizer for home use, with tubing and supplies. J45.909.   NovoLOG FlexPen 100 UNIT/ML FlexPen Generic drug: insulin aspart Inject 22-26 Units into the skin 3 (three) times daily with meals.   ondansetron 4 MG disintegrating tablet Commonly known as: ZOFRAN-ODT TAKE 1 TABLET BY MOUTH EVERY 8 HOURS AS NEEDED FOR NAUSEA AS DIRECTED.   PROBIOTIC-PREBIOTIC PO Take by mouth.   VALERIAN ROOT PO Take 1,000 mg by mouth at bedtime.        Allergies:  Allergies  Allergen Reactions   Amoxicillin Shortness Of Breath and Rash    Patient states she was previously able to tolerate Keflex without issue    Bee Venom Anaphylaxis   Levofloxacin Shortness Of Breath and Rash   Meloxicam Hives and Rash   Vancomycin Anaphylaxis   Sulfa Antibiotics Rash   Ace Inhibitors Cough   Ciprofloxacin Hives   Codeine    Meperidine Hcl    Oxycodone-Acetaminophen    Tyloxapol     Other reaction(s): vomiting/rash   Penicillins Swelling and Rash    Has patient had a PCN reaction causing immediate rash, facial/tongue/throat swelling, SOB or lightheadedness with hypotension: Yes Has patient had a PCN reaction causing severe rash involving mucus membranes or skin necrosis: Yes Has patient had a PCN reaction that required hospitalization: No Has patient had a PCN reaction occurring within the last 10 years: No If all of the above answers are "NO", then may proceed with Cephalosporin use.    Tequin [Gatifloxacin] Rash    Family History: Family History  Problem Relation Age of Onset   Diabetes Mother    Heart disease Mother 73       CHF, CAD   Other Mother  muscle disease   Hypertension Mother     Hyperlipidemia Mother    Arthritis Mother        OA hip L s/p THR   Leukemia Mother 71   Kidney disease Mother    Diabetes Father    Heart disease Father        AMI/CABG/valve replacement age 72.   Stroke Father    Diabetes Brother    Heart disease Brother        stent age 83.   Hypertension Brother    Obesity Brother    Stroke Brother    Fibromyalgia Daughter    Crohn's disease Daughter    Asthma Daughter    Obesity Daughter    Hypertension Son    Alzheimer's disease Maternal Grandmother    Emphysema Maternal Grandfather    Heart disease Paternal Grandmother    Breast cancer Other        2 Aunts   Asthma Daughter    Cancer Paternal Aunt    Varicose Veins Paternal Aunt     Social History:  reports that she has never smoked. She has never used smokeless tobacco. She reports current alcohol use. She reports that she does not use drugs.   Physical Exam: BP (!) 142/82   Pulse (!) 112   Ht 5' 6.5" (1.689 m)   Wt (!) 311 lb (141.1 kg)   BMI 49.44 kg/m   Constitutional:  Alert and oriented, No acute distress. HEENT:  AT, moist mucus membranes.  Trachea midline, no masses. Neurologic: Grossly intact, no focal deficits, moving all 4 extremities. Psychiatric: Normal mood and affect.   Pertinent Imaging: Results for orders placed during the hospital encounter of 05/08/22  US RENAL  Narrative CLINICAL DATA:  Hydronephrosis  EXAM: RENAL / URINARY TRACT ULTRASOUND COMPLETE  COMPARISON:  Same-day CT chest/abdomen/pelvis  FINDINGS: Right Kidney:  Renal measurements: 12.2 cm x 6.2 cm x 6.2 cm = volume: 246 mL. Parenchymal echogenicity is normal. There is no hydronephrosis on the current study.  Left Kidney:  Renal measurements: 11.4 cm x 5.2 cm x 5.2 cm = volume: 160 mL. 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 Parenchymal echogenicity is normal. There is no hydronephrosis.  Bladder:  The bladder could not be  identified.  Other:  None.  IMPRESSION: Normal sonographic appearance of the kidneys with no hydronephrosis on the current study.   Electronically Signed By: Lesia Hausen M.D. On: 05/09/2022 10:25  CLINICAL DATA:  Left lower quadrant pain.   EXAM: CT ABDOMEN AND PELVIS WITH CONTRAST   TECHNIQUE: Multidetector CT imaging of the abdomen and pelvis was performed using the standard protocol following bolus administration of intravenous contrast.   RADIATION DOSE REDUCTION: This exam was performed according to the departmental dose-optimization program which includes automated exposure control, adjustment of the mA and/or kV according to patient size and/or use of iterative reconstruction technique.   CONTRAST:  OMNIPAQUE IOHEXOL 300 MG/ML  SOLN   COMPARISON:  May 09, 2022   FINDINGS: Lower chest: No acute abnormality.   Hepatobiliary: A stable 5.3 cm x 1.4 cm area of parenchymal low attenuation (approximately 33.17 Hounsfield units) is seen within the posterior aspect of the left lobe of the liver. A similar appearing area of parenchymal low attenuation is seen within the caudate lobe of the liver. Status post cholecystectomy. No biliary dilatation.   Pancreas: Unremarkable. No pancreatic ductal dilatation or surrounding inflammatory changes.   Spleen: Normal in size without focal  abnormality.   Adrenals/Urinary Tract: Adrenal glands are unremarkable. Kidneys are normal in size, without focal lesions. Mild left-sided hydronephrosis and hydroureter are seen without evidence of obstructing renal calculi. There is stable mild to moderate severity nonspecific bilateral perinephric inflammatory fat stranding. Mild left-sided peripelvic and proximal periureteral inflammatory fat stranding is also noted. Bladder is unremarkable.   Stomach/Bowel: There is evidence of prior gastric banding surgery. Stomach is otherwise within normal limits. The appendix is  not visualized. No evidence of bowel wall thickening, distention, or inflammatory changes.   Vascular/Lymphatic: Aortic atherosclerosis. No enlarged abdominal or pelvic lymph nodes.   Reproductive: Status post hysterectomy. No adnexal masses.   Other: No abdominal wall hernia or abnormality. No abdominopelvic ascites.   Musculoskeletal: Moderate to marked severity degenerative changes seen at the levels of L4-L5 and L5-S1.   IMPRESSION: 1. Findings which may represent sequelae associated with a recently passed left renal calculus. Correlation with urinalysis is recommended, as acute pyelonephritis cannot be excluded. 2. Stable areas of parenchymal low attenuation within the liver which may represent areas of focal fatty infiltration. 3. Evidence of prior gastric banding surgery. 4. Moderate to marked severity degenerative changes at the levels of L4-L5 and L5-S1. 5. Aortic atherosclerosis.   Aortic Atherosclerosis (ICD10-I70.0).   Electronically Signed   By: Aram Candela M.D.   On: 06/14/2022 16:16  Personally reviewed both scans and agree with radiologic interpretation.    Assessment & Plan:    History of kidney stones  - Comparing the February scan to the March scan it appears she passed the stone she had on the left.   - We discussed general stone prevention techniques including drinking plenty water with goal of producing 2.5 L urine daily, increased citric acid intake, avoidance of high oxalate containing foods, and decreased salt intake.  Information about dietary recommendations given today.   - UA today was normal.  - Encouraged continuing the pre and pro-biotics with cranberry. Mentioned the tablets are better than the juice. Emphasized drinking dark sodas is not good for stone prevention.   - Mentioned if she thinks she is getting a UTI to come in to the office and if she gets one, will consider starting on estrogen cream.   Return if symptoms worsen or fail  to improve.  [facility] [address] [place], [place] [address] [phone]

## 2022-07-18 DIAGNOSIS — H1045 Other chronic allergic conjunctivitis: Secondary | ICD-10-CM | POA: Diagnosis not present

## 2022-07-18 DIAGNOSIS — J3089 Other allergic rhinitis: Secondary | ICD-10-CM | POA: Diagnosis not present

## 2022-07-18 DIAGNOSIS — R052 Subacute cough: Secondary | ICD-10-CM | POA: Diagnosis not present

## 2022-07-18 DIAGNOSIS — Z888 Allergy status to other drugs, medicaments and biological substances status: Secondary | ICD-10-CM | POA: Diagnosis not present

## 2022-07-19 ENCOUNTER — Encounter: Payer: Self-pay | Admitting: Cardiology

## 2022-07-19 ENCOUNTER — Ambulatory Visit: Payer: Medicare PPO | Attending: Cardiology | Admitting: Cardiology

## 2022-07-19 VITALS — BP 118/72 | HR 87 | Ht 66.5 in | Wt 311.0 lb

## 2022-07-19 DIAGNOSIS — I10 Essential (primary) hypertension: Secondary | ICD-10-CM

## 2022-07-19 DIAGNOSIS — R931 Abnormal findings on diagnostic imaging of heart and coronary circulation: Secondary | ICD-10-CM

## 2022-07-19 NOTE — Progress Notes (Signed)
Cardiology Office Note:    Date:  07/19/2022   ID:  [name], DOB 10/04/1965, MRN [id number]  PCP:  Doreene Nest, NP   [facility] Providers Cardiologist:  Debbe Odea, MD     Referring MD: Doreene Nest, NP   Chief Complaint  Patient presents with   New Patient (Initial Visit)    Ref by Vernona Rieger, NP for evaluation of CAD. Patient c/o bilateral LE edema. Medications reviewed by the patient verbally.     History of Present Illness:    [name] is a 57 y.o. female with a hx of hypertension, diabetes, neuromuscular disorder, anxiety who presents due to abnormal echocardiogram.  Patient was admitted to the hospital 2 months ago with respiratory failure, UTI sepsis.  She was almost intubated but her a positive turnaround, intubation deferred.  Was told she had a kidney stone.  Echocardiogram was obtained showing low normal EF 50 to 55%, impaired relaxation.  Advised to follow-up with cardiology upon discharge.  Diagnosed with a neuromuscular disorder in 2019, she needs help with some of her activities of daily living such as wiping herself after showering and wiping her legs.  She walks with a walker at home.  States having leg edema when her feet is down for too long.  Denies chest pain or shortness of breath more than usual.  Overall feels well, no concerns at this time.  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   Anemia    Anxiety    Arthritis    Asthma    Bacteremia 05/09/2022   Cataract    CHF (congestive heart failure) (HCC)    Colon polyp    Complication of anesthesia    Cystitis with hematuria 10/24/2021   Diabetes mellitus    Displaced fracture of proximal end of right fibula 07/21/2017   Edema    Fatigue    Hypertension    IBS (irritable bowel syndrome)    Lump in female breast    Methicillin resistant Staphylococcus aureus in conditions classified  elsewhere and of unspecified site    Migraines    Morbid obesity (HCC)    Neuromuscular disorder (HCC)    Night sweats    Nonspecific abnormal results of thyroid function study    Other B-complex deficiencies    Paresthesias    PONV (postoperative nausea and vomiting)    Pure hypercholesterolemia    Reflux    Sacral fracture (HCC)    Sinus problem    Sleep apnea    Sore on toe 05/17/2021   Symptomatic states associated with artificial menopause    Thyroid disease    Type II or unspecified type diabetes mellitus without mention of complication, not stated as uncontrolled    Unspecified sleep apnea     Past Surgical History:  Procedure Laterality Date   abcess removal  03/21/1995   MRSA   ABDOMINAL HYSTERECTOMY  03/21/1995   complete in 1997, partial was in 1995   carpal tunnel release r  07/03/2013   Dalldorf   CESAREAN SECTION  1987  and 1989   CHOLECYSTECTOMY  03/20/1989   COLONOSCOPY  03/21/2011   Normal.  [facility].   KNEE SURGERY  03/20/2004   LAPAROSCOPIC GASTRIC BANDING  05/18/2009   SPINE SURGERY     TONSILLECTOMY AND ADENOIDECTOMY  03/20/1972   TUBAL LIGATION     ULNAR TUNNEL RELEASE Right 02/05/2018   Procedure:  CUBITAL TUNNEL RELEASE/DECOMPRESSION;  Surgeon: Marcene Corning, MD;  Location: [facility] OR;  Service: Orthopedics;  Laterality: Right;    Current Medications: Current Meds  Medication Sig   acetaminophen (TYLENOL) 325 MG tablet Take 650 mg by mouth every 6 (six) hours as needed.   albuterol (PROVENTIL) (2.5 MG/3ML) 0.083% nebulizer solution Take 3 mLs (2.5 mg total) by nebulization every 6 (six) hours as needed for wheezing or shortness of breath.   albuterol (VENTOLIN HFA) 108 (90 Base) MCG/ACT inhaler Inhale 1-2 puffs into the lungs every 6 (six) hours as needed for wheezing or shortness of breath.   atorvastatin (LIPITOR) 40 MG tablet Take 1 tablet (40 mg total) by mouth daily. for cholesterol.   Bacillus Coagulans-Inulin (PROBIOTIC-PREBIOTIC PO) Take  by mouth.   Continuous Blood Gluc Receiver (DEXCOM G7 RECEIVER) DEVI by Does not apply route.   Continuous Blood Gluc Sensor (DEXCOM G7 SENSOR) MISC by Does not apply route.   cyclobenzaprine (FLEXERIL) 10 MG tablet TAKE ONE TABLET BY MOUTH THREE TIMES DAILY AS NEEDED FOR MUSCLE SPASMS   diphenhydramine-acetaminophen (TYLENOL PM) 25-500 MG TABS tablet Take 1 tablet by mouth at bedtime.   Dulaglutide 4.5 MG/0.5ML SOPN Inject into the skin.   EPINEPHrine (EPIPEN 2-PAK) 0.3 mg/0.3 mL IJ SOAJ injection Inject 0.3 mg into the muscle as needed for anaphylaxis.   ergocalciferol (VITAMIN D2) 1.25 MG (50000 UT) capsule Take by mouth.   fexofenadine (ALLEGRA) 180 MG tablet 1 tablet   FLUoxetine (PROZAC) 40 MG capsule TAKE 1 CAPSULE BY MOUTH ONCE DAILY FOR ANXIETY AND DEPRESSION   fluticasone (FLONASE) 50 MCG/ACT nasal spray Place 1 spray into both nostrils 2 (two) times daily.   gabapentin (NEURONTIN) 600 MG tablet Take 600 mg by mouth 2 (two) times daily.   hydrochlorothiazide (HYDRODIURIL) 12.5 MG tablet TAKE ONE TABLET BY MOUTH ONCE DAILY FOR BLOOD PRESSURE   insulin aspart (NOVOLOG FLEXPEN) 100 UNIT/ML FlexPen Inject 22-26 Units into the skin 3 (three) times daily with meals.   insulin degludec (TRESIBA) 100 UNIT/ML FlexTouch Pen Inject 40 Units into the skin 2 (two) times daily.   Insulin Pen Needle 32G X 4 MM MISC Use 1 needle with pen as directed   levocetirizine (XYZAL) 5 MG tablet TAKE ONE TABLET BY MOUTH EVERY EVENING   losartan (COZAAR) 50 MG tablet TAKE ONE TABLET BY MOUTH ONCE DAILY FOR BLOOD PRESSURE   metoprolol tartrate (LOPRESSOR) 25 MG tablet TAKE 0.5 TABLET BY MOUTH TWICE DAILY FOR BLOOD PRESSURE   Multiple Vitamin (MULTIVITAMIN PO) Take 1 tablet by mouth daily.    Nebulizer MISC Dispense 1 nebulizer for home use, with tubing and supplies. J45.909.   ondansetron (ZOFRAN-ODT) 4 MG disintegrating tablet TAKE 1 TABLET BY MOUTH EVERY 8 HOURS AS NEEDED FOR NAUSEA AS DIRECTED.   VALERIAN  ROOT PO Take 1,000 mg by mouth at bedtime.   Current Facility-Administered Medications for the 07/19/22 encounter (Office Visit) with Debbe Odea, MD  Medication   silver sulfADIAZINE (SILVADENE) 1 % cream 1 Application     Allergies:   Amoxicillin, Bee venom, Levofloxacin, Meloxicam, Vancomycin, Ciprofloxacin, Sulfa antibiotics, Ace inhibitors, Codeine, Meperidine hcl, Oxycodone, Oxycodone-acetaminophen, Penicillin g, Tyloxapol, Penicillins, and Tequin [gatifloxacin]   Social History   Socioeconomic History   Marital status: Married    Spouse name: Not on file   Number of children: 3   Years of education: college   Highest education level: Not on file  Occupational History   Occupation: Animal nutritionist: [employer]  Tobacco Use   Smoking status: Never   Smokeless tobacco: Never  Vaping Use   Vaping Use: Never used  Substance and Sexual Activity   Alcohol use: Yes    Comment: Socially   Drug use: No   Sexual activity: Not Currently    Birth control/protection: None    Comment: Hysterectomy  Other Topics Concern   Not on file  Social History Narrative   Exercise: walking two days per week, 30 minutes.       Married x 29 years, happily married; no abuse.       Children: 3 children; 1 grandchild; two step grandchildren; 1 gg.      Lives: with husband, mother-in-law.      Employment:  [employer] x 1996; Advice worker.  Loves work.      Tobacco: never      Alcohol: socially.  One glass of wine per month.      Exercise:  Walking around campus.        Seatbelt: 100%      Guns: gun safe in garage.   Social Determinants of Health   Financial Resource Strain: Low Risk  (09/30/2021)   Overall Financial Resource Strain (CARDIA)    Difficulty of Paying Living Expenses: Not hard at all  Food Insecurity: No Food Insecurity (05/15/2022)   Hunger Vital Sign    Worried About Running Out of Food in the Last Year: Never true    Ran Out of Food in the Last Year: Never  true  Transportation Needs: No Transportation Needs (05/15/2022)   PRAPARE - Administrator, Civil Service (Medical): No    Lack of Transportation (Non-Medical): No  Physical Activity: Inactive (09/30/2021)   Exercise Vital Sign    Days of Exercise per Week: 0 days    Minutes of Exercise per Session: 0 min  Stress: No Stress Concern Present (09/30/2021)   [employer] of Occupational Health - Occupational Stress Questionnaire    Feeling of Stress : Not at all  Social Connections: Not on file     Family History: The patient's family history includes Alzheimer's disease in her maternal grandmother; Arthritis in her mother; Asthma in her daughter and daughter; Breast cancer in an other family member; Cancer in her paternal aunt; Crohn's disease in her daughter; Diabetes in her brother, father, and mother; Emphysema in her maternal grandfather; Fibromyalgia in her daughter; Heart disease in her brother, father, and paternal grandmother; Heart disease (age of onset: 73) in her mother; Hyperlipidemia in her mother; Hypertension in her brother, mother, and son; Kidney disease in her mother; Leukemia (age of onset: 81) in her mother; Obesity in her brother and daughter; Other in her mother; Stroke in her brother and father; Varicose Veins in her paternal aunt.  ROS:   Please see the history of present illness.     All other systems reviewed and are negative.  EKGs/Labs/Other Studies Reviewed:    The following studies were reviewed today:   EKG:  EKG is  ordered today.  The ekg ordered today demonstrates normal sinus rhythm, normal ECG  Recent Labs: 05/13/2022: Magnesium 2.1 06/14/2022: ALT 25; BUN 21; Creatinine, Ser 1.13; Hemoglobin 13.3; Platelets 238; Potassium 4.1; Sodium 140  Recent Lipid Panel    Component Value Date/Time   CHOL 148 06/21/2021 0847   TRIG 162.0 (H) 06/21/2021 0847   HDL 54.40 06/21/2021 0847   CHOLHDL 3 06/21/2021 0847   VLDL 32.4 06/21/2021 0847    LDLCALC 62 06/21/2021  4098   LDLDIRECT 72.0 05/25/2020 0834     Risk Assessment/Calculations:             Physical Exam:    VS:  BP 118/72 (BP Location: Left Arm, Patient Position: Sitting, Cuff Size: Large)   Pulse 87   Ht 5' 6.5" (1.689 m)   Wt (!) 311 lb (141.1 kg)   SpO2 96%   BMI 49.44 kg/m     Wt Readings from Last 3 Encounters:  07/19/22 (!) 311 lb (141.1 kg)  07/11/22 (!) 311 lb (141.1 kg)  06/30/22 (!) 331 lb (150.1 kg)     GEN:  Well nourished, well developed in no acute distress HEENT: Normal NECK: No JVD; No carotid bruits CARDIAC: RRR, no murmurs, rubs, gallops RESPIRATORY:  Clear to auscultation without rales, wheezing or rhonchi  ABDOMEN: Soft, non-tender, non-distended MUSCULOSKELETAL:  No edema; No deformity  SKIN: Warm and dry NEUROLOGIC:  Alert and oriented x 3 PSYCHIATRIC:  Normal affect   ASSESSMENT:    1. Primary hypertension   2. Morbid obesity (HCC)   3. Echocardiogram abnormal    PLAN:    In order of problems listed above:  Hypertension, BP controlled.  Continue losartan, Lopressor, HCTZ. Morbid obesity, low-calorie diet, weight loss advised. History of EKG abnormality, EKG obtained 04/2022 reviewed, EF 50 to 55%, no gross structural abnormalities noted.  No significant wall motion abnormalities noted.  Difficult images due to obesity.  Patient clinically asymptomatic, no indication for additional cardiac testing at this time.  Follow-up as needed       Medication Adjustments/Labs and Tests Ordered: Current medicines are reviewed at length with the patient today.  Concerns regarding medicines are outlined above.  Orders Placed This Encounter  Procedures   EKG 12-Lead   No orders of the defined types were placed in this encounter.   Patient Instructions  Medication Instructions:   None Ordered  *If you need a refill on your cardiac medications before your next appointment, please call your pharmacy*   Lab Work:  None  Ordered  If you have labs (blood work) drawn today and your tests are completely normal, you will receive your results only by: MyChart Message (if you have MyChart) OR A paper copy in the mail If you have any lab test that is abnormal or we need to change your treatment, we will call you to review the results.   Testing/Procedures:  1. None Ordered   Follow-Up: At [facility], you and your health needs are our priority.  As part of our continuing mission to provide you with exceptional heart care, we have created designated Provider Care Teams.  These Care Teams include your primary Cardiologist (physician) and Advanced Practice Providers (APPs -  Physician Assistants and Nurse Practitioners) who all work together to provide you with the care you need, when you need it.  We recommend signing up for the patient portal called "MyChart".  Sign up information is provided on this After Visit Summary.  MyChart is used to connect with patients for Virtual Visits (Telemedicine).  Patients are able to view lab/test results, encounter notes, upcoming appointments, etc.  Non-urgent messages can be sent to your provider as well.   To learn more about what you can do with MyChart, go to [email or web address].    Your next appointment:    As needed   Signed, Debbe Odea, MD  07/19/2022 9:20 AM    [facility]

## 2022-07-19 NOTE — Patient Instructions (Signed)
Medication Instructions:   None Ordered  *If you need a refill on your cardiac medications before your next appointment, please call your pharmacy*   Lab Work:  None Ordered  If you have labs (blood work) drawn today and your tests are completely normal, you will receive your results only by: MyChart Message (if you have MyChart) OR A paper copy in the mail If you have any lab test that is abnormal or we need to change your treatment, we will call you to review the results.   Testing/Procedures:  1. None Ordered   Follow-Up: At [facility], you and your health needs are our priority.  As part of our continuing mission to provide you with exceptional heart care, we have created designated Provider Care Teams.  These Care Teams include your primary Cardiologist (physician) and Advanced Practice Providers (APPs -  Physician Assistants and Nurse Practitioners) who all work together to provide you with the care you need, when you need it.  We recommend signing up for the patient portal called "MyChart".  Sign up information is provided on this After Visit Summary.  MyChart is used to connect with patients for Virtual Visits (Telemedicine).  Patients are able to view lab/test results, encounter notes, upcoming appointments, etc.  Non-urgent messages can be sent to your provider as well.   To learn more about what you can do with MyChart, go to [email or web address].    Your next appointment:    As needed

## 2022-07-22 ENCOUNTER — Other Ambulatory Visit: Payer: Self-pay | Admitting: Primary Care

## 2022-07-22 DIAGNOSIS — F419 Anxiety disorder, unspecified: Secondary | ICD-10-CM

## 2022-07-31 ENCOUNTER — Ambulatory Visit: Payer: Medicare PPO | Attending: Otolaryngology

## 2022-07-31 DIAGNOSIS — E669 Obesity, unspecified: Secondary | ICD-10-CM | POA: Diagnosis not present

## 2022-07-31 DIAGNOSIS — R0683 Snoring: Secondary | ICD-10-CM | POA: Diagnosis not present

## 2022-07-31 DIAGNOSIS — J45909 Unspecified asthma, uncomplicated: Secondary | ICD-10-CM | POA: Diagnosis not present

## 2022-07-31 DIAGNOSIS — E119 Type 2 diabetes mellitus without complications: Secondary | ICD-10-CM | POA: Diagnosis not present

## 2022-07-31 DIAGNOSIS — G4733 Obstructive sleep apnea (adult) (pediatric): Secondary | ICD-10-CM | POA: Diagnosis not present

## 2022-07-31 DIAGNOSIS — I10 Essential (primary) hypertension: Secondary | ICD-10-CM | POA: Insufficient documentation

## 2022-07-31 DIAGNOSIS — R0902 Hypoxemia: Secondary | ICD-10-CM | POA: Diagnosis not present

## 2022-08-03 ENCOUNTER — Encounter: Payer: Self-pay | Admitting: Ophthalmology

## 2022-08-03 NOTE — Anesthesia Preprocedure Evaluation (Addendum)
Anesthesia Evaluation  Patient identified by MRN, date of birth, ID band Patient awake    Reviewed: Allergy & Precautions, H&P , NPO status , Patient's Chart, lab work & pertinent test results, reviewed documented beta blocker date and time   History of Anesthesia Complications (+) PONV and history of anesthetic complications  Airway Mallampati: III  TM Distance: >3 FB Neck ROM: Full    Dental no notable dental hx.    Pulmonary asthma , sleep apnea    Pulmonary exam normal breath sounds clear to auscultation       Cardiovascular hypertension, Pt. on home beta blockers and Pt. on medications +CHF  Normal cardiovascular exam Rhythm:Regular Rate:Normal  Echo 05-08-22 EF 50-55%, RWMA with possible inferoseptal and inferior hypokinesis, Grade I diastolic dysfunction   Neuro/Psych  Headaches  Anxiety      Neuromuscular disease  negative psych ROS   GI/Hepatic Neg liver ROS,GERD  Medicated,,  Endo/Other  diabetes    Renal/GU negative Renal ROS  negative genitourinary   Musculoskeletal  (+) Arthritis , Osteoarthritis,    Abdominal   Peds negative pediatric ROS (+)  Hematology  (+) Blood dyscrasia, anemia   Anesthesia Other Findings 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 patient denies ALS; states has been told she does NOT have ALS states was told problems due to lap band procedure Anemia   Anxiety   Arthritis   Asthma   Bacteremia 05/09/2022 Cataract   CHF (congestive heart failure) (HCC)   Colon polyp   Complication of anesthesia   Cystitis with hematuria 10/24/2021   Displaced fracture of proximal end of right fibula 07/21/2017 Edema   Fatigue   Hypertension   IBS (irritable bowel syndrome)   Lump in female breast   Methicillin resistant Staphylococcus aureus in conditions classified elsewhere and of unspecified site   Migraines   Morbid obesity (HCC)   Neuromuscular  disorder (HCC)   Night sweats   Nonspecific abnormal results of thyroid function study   Other B-complex deficiencies   Paresthesias   PONV (postoperative nausea and vomiting)   Pure hypercholesterolemia   Reflux   Sacral fracture (HCC)   Sinus problem   Sleep apnea   Sore on toe 05/17/2021 Symptomatic states associated with artificial menopause   Thyroid disease   Type II or unspecified type diabetes mellitus w   Unspecified sleep apnea       Reproductive/Obstetrics negative OB ROS                             Anesthesia Physical Anesthesia Plan  ASA: 3  Anesthesia Plan: MAC   Post-op Pain Management:    Induction: Intravenous  PONV Risk Score and Plan:   Airway Management Planned: Natural Airway and Nasal Cannula  Additional Equipment:   Intra-op Plan:   Post-operative Plan:   Informed Consent: I have reviewed the patients History and Physical, chart, labs and discussed the procedure including the risks, benefits and alternatives for the proposed anesthesia with the patient or authorized representative who has indicated his/her understanding and acceptance.     Dental Advisory Given  Plan Discussed with: Anesthesiologist, CRNA and Surgeon  Anesthesia Plan Comments: (Patient consented for risks of anesthesia including but not limited to:  - adverse reactions to medications - damage to eyes, teeth, lips or other oral mucosa - nerve damage due to positioning  - sore throat or hoarseness - Damage to heart, brain, nerves, lungs, other parts of  body or loss of life  Patient voiced understanding.)        Anesthesia Quick Evaluation

## 2022-08-10 ENCOUNTER — Encounter
Admission: RE | Disposition: A | Discharge status: Home / Self Care | Payer: Self-pay | Source: Home / Self Care | Attending: Ophthalmology

## 2022-08-10 ENCOUNTER — Encounter: Payer: Self-pay | Admitting: Ophthalmology

## 2022-08-10 ENCOUNTER — Other Ambulatory Visit: Payer: Self-pay

## 2022-08-10 ENCOUNTER — Ambulatory Visit: Payer: Medicare PPO | Admitting: Anesthesiology

## 2022-08-10 ENCOUNTER — Ambulatory Visit
Admission: RE | Admit: 2022-08-10 | Discharge: 2022-08-10 | Disposition: A | Discharge status: Home / Self Care | Payer: Medicare PPO | Attending: Ophthalmology | Admitting: Ophthalmology

## 2022-08-10 DIAGNOSIS — I509 Heart failure, unspecified: Secondary | ICD-10-CM | POA: Diagnosis not present

## 2022-08-10 DIAGNOSIS — F419 Anxiety disorder, unspecified: Secondary | ICD-10-CM | POA: Diagnosis not present

## 2022-08-10 DIAGNOSIS — D759 Disease of blood and blood-forming organs, unspecified: Secondary | ICD-10-CM | POA: Diagnosis not present

## 2022-08-10 DIAGNOSIS — H2512 Age-related nuclear cataract, left eye: Secondary | ICD-10-CM | POA: Insufficient documentation

## 2022-08-10 DIAGNOSIS — D649 Anemia, unspecified: Secondary | ICD-10-CM | POA: Diagnosis not present

## 2022-08-10 DIAGNOSIS — E785 Hyperlipidemia, unspecified: Secondary | ICD-10-CM | POA: Diagnosis not present

## 2022-08-10 DIAGNOSIS — I10 Essential (primary) hypertension: Secondary | ICD-10-CM | POA: Diagnosis not present

## 2022-08-10 DIAGNOSIS — E1136 Type 2 diabetes mellitus with diabetic cataract: Secondary | ICD-10-CM | POA: Diagnosis not present

## 2022-08-10 DIAGNOSIS — G4730 Sleep apnea, unspecified: Secondary | ICD-10-CM | POA: Insufficient documentation

## 2022-08-10 DIAGNOSIS — I110 Hypertensive heart disease with heart failure: Secondary | ICD-10-CM | POA: Diagnosis not present

## 2022-08-10 HISTORY — PX: CATARACT EXTRACTION W/PHACO: SHX586

## 2022-08-10 LAB — GLUCOSE, CAPILLARY: Glucose-Capillary: 183 mg/dL — ABNORMAL HIGH (ref 70–99)

## 2022-08-10 SURGERY — PHACOEMULSIFICATION, CATARACT, WITH IOL INSERTION
Anesthesia: Monitor Anesthesia Care | Site: Eye | Laterality: Left

## 2022-08-10 MED ORDER — SIGHTPATH DOSE#1 NA HYALUR & NA CHOND-NA HYALUR IO KIT
PACK | INTRAOCULAR | Status: DC | PRN
  Administered 2022-08-10: 1 via OPHTHALMIC

## 2022-08-10 MED ORDER — TETRACAINE HCL 0.5 % OP SOLN
1.0000 [drp] | OPHTHALMIC | Status: DC | PRN
  Administered 2022-08-10 (×3): 1 [drp] via OPHTHALMIC

## 2022-08-10 MED ORDER — SIGHTPATH DOSE#1 BSS IO SOLN
INTRAOCULAR | Status: DC | PRN
  Administered 2022-08-10: 53 mL via OPHTHALMIC

## 2022-08-10 MED ORDER — SIGHTPATH DOSE#1 BSS IO SOLN
INTRAOCULAR | Status: DC | PRN
  Administered 2022-08-10: 15 mL

## 2022-08-10 MED ORDER — ONDANSETRON HCL 4 MG/2ML IJ SOLN
INTRAMUSCULAR | Status: DC | PRN
  Administered 2022-08-10: 4 mg via INTRAVENOUS

## 2022-08-10 MED ORDER — POLYMYXIN B-TRIMETHOPRIM 10000-0.1 UNIT/ML-% OP SOLN
OPHTHALMIC | Status: DC | PRN
  Administered 2022-08-10: 1 [drp] via OPHTHALMIC

## 2022-08-10 MED ORDER — LIDOCAINE HCL (PF) 2 % IJ SOLN
INTRAOCULAR | Status: DC | PRN
  Administered 2022-08-10: 1 mL via INTRAOCULAR

## 2022-08-10 MED ORDER — MIDAZOLAM HCL 5 MG/5ML IJ SOLN
INTRAMUSCULAR | Status: DC | PRN
  Administered 2022-08-10 (×2): 1 mg via INTRAVENOUS

## 2022-08-10 MED ORDER — BRIMONIDINE TARTRATE-TIMOLOL 0.2-0.5 % OP SOLN
OPHTHALMIC | Status: DC | PRN
  Administered 2022-08-10: 1 [drp] via OPHTHALMIC

## 2022-08-10 MED ORDER — ARMC OPHTHALMIC DILATING DROPS
1.0000 | OPHTHALMIC | Status: DC | PRN
  Administered 2022-08-10 (×3): 1 via OPHTHALMIC

## 2022-08-10 SURGICAL SUPPLY — 12 items
CATARACT SUITE SIGHTPATH (MISCELLANEOUS) ×1 IMPLANT
DISSECTOR HYDRO NUCLEUS 50X22 (MISCELLANEOUS) ×2 IMPLANT
DRSG TEGADERM 2-3/8X2-3/4 SM (GAUZE/BANDAGES/DRESSINGS) ×2 IMPLANT
FEE CATARACT SUITE SIGHTPATH (MISCELLANEOUS) ×2 IMPLANT
GLOVE SURG SYN 7.5  E (GLOVE) ×1
GLOVE SURG SYN 7.5 E (GLOVE) ×1 IMPLANT
GLOVE SURG SYN 7.5 PF PI (GLOVE) ×2 IMPLANT
GLOVE SURG SYN 8.5  E (GLOVE) ×1
GLOVE SURG SYN 8.5 E (GLOVE) ×1 IMPLANT
GLOVE SURG SYN 8.5 PF PI (GLOVE) ×2 IMPLANT
LENS ENVISTA ASPIRE EAU 18.0 (Intraocular Lens) ×1 IMPLANT
LENS IOL ENVISTA ASPR 18.0 (Intraocular Lens) IMPLANT

## 2022-08-10 NOTE — Op Note (Addendum)
OPERATIVE NOTE  [name] [id number] 08/10/2022   PREOPERATIVE DIAGNOSIS: Nuclear sclerotic cataract left eye. H25.12   POSTOPERATIVE DIAGNOSIS: Nuclear sclerotic cataract left eye. H25.12   PROCEDURE:  Phacoemusification with posterior chamber intraocular lens placement of the left eye  Ultrasound time: Procedure(s): CATARACT EXTRACTION PHACO AND INTRAOCULAR LENS PLACEMENT (IOC) LEFT DIABETIC  ASPIRE ENVISTA  LENS  4.39  00:36.5 (Left)  LENS:   Implant Name Type Inv. Item Serial No. Manufacturer Lot No. LRB No. Used Action  LENS ENVISTA ASPIRE EAU 18.0 - [id number] Intraocular Lens LENS ENVISTA ASPIRE EAU 18.0 [id number] [employer] [id number] Left 1 Implanted      SURGEON:  Julious Payer. Rolley Sims, MD   ANESTHESIA:  Topical with tetracaine drops, augmented with 1% preservative-free intracameral lidocaine.   COMPLICATIONS:  None.   DESCRIPTION OF PROCEDURE:  The patient was identified in the holding room and transported to the operating room and placed in the supine position under the operating microscope.  The left eye was identified as the operative eye, which was prepped and draped in the usual sterile ophthalmic fashion.   A 1 millimeter clear-corneal paracentesis was made inferotemporally. Preservative-free 1% lidocaine mixed with 1:1,000 bisulfite-free aqueous solution of epinephrine was injected into the anterior chamber. The anterior chamber was then filled with Viscoat viscoelastic. A 2.4 millimeter keratome was used to make a clear-corneal incision superotemporally. A curvilinear capsulorrhexis was made with a cystotome and capsulorrhexis forceps. Balanced salt solution was used to hydrodissect and hydrodelineate the nucleus. Phacoemulsification was then used to remove the lens nucleus and epinucleus. The remaining cortex was then removed using the irrigation and aspiration handpiece. Provisc was then placed into the capsular bag to distend it for lens placement. A +18.00 D EA  intraocular lens was then injected into the capsular bag. An anterior rent was noted opposite the main incision so the lens haptics were rotated 90 degrees away. The anterior rent did not radialize posteriorly. The remaining viscoelastic was aspirated.   Wounds were hydrated with balanced salt solution.  The anterior chamber was inflated to a physiologic pressure with balanced salt solution.  No wound leaks were noted. Timolol and Brimonidine drops were applied to the eye followed by Polytrim drops.  The patient was taken to the recovery room in stable condition without complications of anesthesia or surgery.  [name] 08/10/2022, 12:12 PM

## 2022-08-10 NOTE — H&P (Signed)
[facility]   Primary Care Physician:  Doreene Nest, NP Ophthalmologist: Dr. Deberah Pelton  Pre-Procedure History & Physical: HPI:  [name] is a 57 y.o. female here for cataract surgery.   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   Anemia    Anxiety    Arthritis    Asthma    Bacteremia 05/09/2022   Cataract    CHF (congestive heart failure) (HCC)    Colon polyp    Complication of anesthesia    Cystitis with hematuria 10/24/2021   Diabetes mellitus    Displaced fracture of proximal end of right fibula 07/21/2017   Edema    Fatigue    Hypertension    IBS (irritable bowel syndrome)    Lump in female breast    Methicillin resistant Staphylococcus aureus in conditions classified elsewhere and of unspecified site    Migraines    Morbid obesity (HCC)    Neuromuscular disorder (HCC)    Night sweats    Nonspecific abnormal results of thyroid function study    Other B-complex deficiencies    Paresthesias    PONV (postoperative nausea and vomiting)    Pure hypercholesterolemia    Reflux    Sacral fracture (HCC)    Sinus problem    Sleep apnea    Sore on toe 05/17/2021   Symptomatic states associated with artificial menopause    Thyroid disease    Type II or unspecified type diabetes mellitus without mention of complication, not stated as uncontrolled    Unspecified sleep apnea     Past Surgical History:  Procedure Laterality Date   abcess removal  03/21/1995   MRSA   ABDOMINAL HYSTERECTOMY  03/21/1995   complete in 1997, partial was in 1995   carpal tunnel release r  07/03/2013   Dalldorf   CESAREAN SECTION  1987  and 1989   CHOLECYSTECTOMY  03/20/1989   COLONOSCOPY  03/21/2011   Normal.  [facility].   KNEE SURGERY  03/20/2004   LAPAROSCOPIC GASTRIC BANDING  05/18/2009   SPINE SURGERY     TONSILLECTOMY AND ADENOIDECTOMY  03/20/1972   TUBAL LIGATION     ULNAR TUNNEL RELEASE  Right 02/05/2018   Procedure: CUBITAL TUNNEL RELEASE/DECOMPRESSION;  Surgeon: Marcene Corning, MD;  Location: [facility] OR;  Service: Orthopedics;  Laterality: Right;    Prior to Admission medications   Medication Sig Start Date End Date Taking? Authorizing Provider  acetaminophen (TYLENOL) 325 MG tablet Take 650 mg by mouth every 6 (six) hours as needed.   Yes [provider]  albuterol (PROVENTIL) (2.5 MG/3ML) 0.083% nebulizer solution Take 3 mLs (2.5 mg total) by nebulization every 6 (six) hours as needed for wheezing or shortness of breath. 01/12/22  Yes Joaquim Nam, MD  albuterol (VENTOLIN HFA) 108 (90 Base) MCG/ACT inhaler Inhale 1-2 puffs into the lungs every 6 (six) hours as needed for wheezing or shortness of breath. 01/12/22  Yes Joaquim Nam, MD  atorvastatin (LIPITOR) 40 MG tablet Take 1 tablet (40 mg total) by mouth daily. for cholesterol. 08/12/21  Yes Doreene Nest, NP  Bacillus Coagulans-Inulin (PROBIOTIC-PREBIOTIC PO) Take by mouth.   Yes [provider]  Continuous Blood Gluc Receiver (DEXCOM G7 RECEIVER) DEVI by Does not apply route.   Yes [provider]  Continuous Blood Gluc Sensor (DEXCOM G7 SENSOR) MISC by Does not apply route.   Yes [provider]  cyclobenzaprine (  FLEXERIL) 10 MG tablet TAKE ONE TABLET BY MOUTH THREE TIMES DAILY AS NEEDED FOR MUSCLE SPASMS 05/27/22  Yes Doreene Nest, NP  Dulaglutide 4.5 MG/0.5ML SOPN Inject into the skin. 05/18/20  Yes [provider]  EPINEPHrine (EPIPEN 2-PAK) 0.3 mg/0.3 mL IJ SOAJ injection Inject 0.3 mg into the muscle as needed for anaphylaxis. 05/14/22  Yes Zigmund Daniel., MD  ergocalciferol (VITAMIN D2) 1.25 MG (50000 UT) capsule Take by mouth. 11/14/18  Yes [provider]  fexofenadine (ALLEGRA) 180 MG tablet 1 tablet   Yes [provider]  FLUoxetine (PROZAC) 40 MG capsule TAKE ONE CAPSULE BY MOUTH ONCE DAILY FOR ANXIETY AND DEPRESSION 07/23/22  Yes  Doreene Nest, NP  fluticasone (FLONASE) 50 MCG/ACT nasal spray Place 1 spray into both nostrils 2 (two) times daily. 05/25/20  Yes Doreene Nest, NP  gabapentin (NEURONTIN) 600 MG tablet Take 600 mg by mouth 2 (two) times daily.   Yes [provider]  hydrochlorothiazide (HYDRODIURIL) 12.5 MG tablet TAKE ONE TABLET BY MOUTH ONCE DAILY FOR BLOOD PRESSURE 06/15/22  Yes Doreene Nest, NP  insulin aspart (NOVOLOG FLEXPEN) 100 UNIT/ML FlexPen Inject 22-26 Units into the skin 3 (three) times daily with meals. 12/01/19  Yes Doreene Nest, NP  insulin degludec (TRESIBA) 100 UNIT/ML FlexTouch Pen Inject 40 Units into the skin 2 (two) times daily. 01/12/22  Yes Joaquim Nam, MD  Insulin Pen Needle 32G X 4 MM MISC Use 1 needle with pen as directed 09/20/19  Yes Doreene Nest, NP  levocetirizine (XYZAL) 5 MG tablet TAKE ONE TABLET BY MOUTH EVERY EVENING 07/16/17  Yes Doreene Nest, NP  losartan (COZAAR) 50 MG tablet TAKE ONE TABLET BY MOUTH ONCE DAILY FOR BLOOD PRESSURE 06/15/22  Yes Doreene Nest, NP  metoprolol tartrate (LOPRESSOR) 25 MG tablet TAKE 0.5 TABLET BY MOUTH TWICE DAILY FOR BLOOD PRESSURE 06/02/22  Yes Doreene Nest, NP  Multiple Vitamin (MULTIVITAMIN PO) Take 1 tablet by mouth daily.    Yes [provider]  Nebulizer MISC Dispense 1 nebulizer for home use, with tubing and supplies. J45.909. 01/12/22  Yes Joaquim Nam, MD  ondansetron (ZOFRAN-ODT) 4 MG disintegrating tablet TAKE 1 TABLET BY MOUTH EVERY 8 HOURS AS NEEDED FOR NAUSEA AS DIRECTED. 04/16/22  Yes Doreene Nest, NP  VALERIAN ROOT PO Take 1,000 mg by mouth at bedtime.   Yes [provider]  diphenhydramine-acetaminophen (TYLENOL PM) 25-500 MG TABS tablet Take 1 tablet by mouth at bedtime. Patient not taking: Reported on 08/03/2022    [provider]    Allergies as of 06/13/2022 - Review Complete 05/19/2022  Allergen Reaction Noted   Amoxicillin Shortness Of  Breath and Rash 10/16/2011   Bee venom Anaphylaxis 07/20/2017   Levofloxacin Shortness Of Breath and Rash 06/27/2007   Meloxicam Hives and Rash 08/27/2013   Vancomycin Anaphylaxis    Sulfa antibiotics Rash 12/20/2015   Ace inhibitors Cough 02/21/2012   Ciprofloxacin Hives 05/14/2022   Codeine  02/21/2012   Meperidine hcl     Oxycodone-acetaminophen     Tyloxapol  01/12/2021   Penicillins Swelling and Rash    Tequin [gatifloxacin] Rash 10/16/2011    Family History  Problem Relation Age of Onset   Diabetes Mother    Heart disease Mother 63       CHF, CAD   Other Mother        muscle disease   Hypertension Mother    Hyperlipidemia Mother  Arthritis Mother        OA hip L s/p THR   Leukemia Mother 54   Kidney disease Mother    Diabetes Father    Heart disease Father        AMI/CABG/valve replacement age 33.   Stroke Father    Diabetes Brother    Heart disease Brother        stent age 102.   Hypertension Brother    Obesity Brother    Stroke Brother    Cancer Paternal Aunt    Varicose Veins Paternal Aunt    Alzheimer's disease Maternal Grandmother    Emphysema Maternal Grandfather    Heart disease Paternal Grandmother    Fibromyalgia Daughter    Crohn's disease Daughter    Asthma Daughter    Obesity Daughter    Asthma Daughter    Hypertension Son    Breast cancer Other        2 Aunts    Social History   Socioeconomic History   Marital status: Married    Spouse name: Not on file   Number of children: 3   Years of education: college   Highest education level: Not on file  Occupational History   Occupation: Animal nutritionist: [employer]  Tobacco Use   Smoking status: Never   Smokeless tobacco: Never  Vaping Use   Vaping Use: Never used  Substance and Sexual Activity   Alcohol use: Yes    Comment: Socially   Drug use: No   Sexual activity: Not Currently    Birth control/protection: None    Comment: Hysterectomy  Other Topics Concern    Not on file  Social History Narrative   Exercise: walking two days per week, 30 minutes.       Married x 29 years, happily married; no abuse.       Children: 3 children; 1 grandchild; two step grandchildren; 1 gg.      Lives: with husband, mother-in-law.      Employment:  [employer] x 1996; Advice worker.  Loves work.      Tobacco: never      Alcohol: socially.  One glass of wine per month.      Exercise:  Walking around campus.        Seatbelt: 100%      Guns: gun safe in garage.   Social Determinants of Health   Financial Resource Strain: Low Risk  (09/30/2021)   Overall Financial Resource Strain (CARDIA)    Difficulty of Paying Living Expenses: Not hard at all  Food Insecurity: No Food Insecurity (05/15/2022)   Hunger Vital Sign    Worried About Running Out of Food in the Last Year: Never true    Ran Out of Food in the Last Year: Never true  Transportation Needs: No Transportation Needs (05/15/2022)   PRAPARE - Administrator, Civil Service (Medical): No    Lack of Transportation (Non-Medical): No  Physical Activity: Inactive (09/30/2021)   Exercise Vital Sign    Days of Exercise per Week: 0 days    Minutes of Exercise per Session: 0 min  Stress: No Stress Concern Present (09/30/2021)   [employer] of Occupational Health - Occupational Stress Questionnaire    Feeling of Stress : Not at all  Social Connections: Not on file  Intimate Partner Violence: Not At Risk (05/11/2022)   Humiliation, Afraid, Rape, and Kick questionnaire    Fear of Current or Ex-Partner: No  Emotionally Abused: No    Physically Abused: No    Sexually Abused: No    Review of Systems: See HPI, otherwise negative ROS  Physical Exam: BP (!) 144/53   Pulse 88   Temp 98.5 F (36.9 C) (Temporal)   Resp 16   Ht 5' 6.5" (1.689 m)   Wt (!) 141.1 kg   SpO2 97%   BMI 49.44 kg/m  General:   Alert, cooperative in NAD Head:  Normocephalic and atraumatic. Respiratory:  Normal work of  breathing. Cardiovascular:  RRR  Impression/Plan: [name] is here for cataract surgery.  Risks, benefits, limitations, and alternatives regarding cataract surgery have been reviewed with the patient.  Questions have been answered.  All parties agreeable.   Estanislado Pandy, MD  08/10/2022, 11:29 AM

## 2022-08-10 NOTE — Anesthesia Postprocedure Evaluation (Signed)
Anesthesia Post Note  Patient: [name]  Procedure(s) Performed: CATARACT EXTRACTION PHACO AND INTRAOCULAR LENS PLACEMENT (IOC) LEFT DIABETIC  ASPIRE ENVISTA  LENS  4.39  00:36.5 (Left: Eye)  Patient location during evaluation: PACU Anesthesia Type: MAC Level of consciousness: awake and alert Pain management: pain level controlled Vital Signs Assessment: post-procedure vital signs reviewed and stable Respiratory status: spontaneous breathing, nonlabored ventilation, respiratory function stable and patient connected to nasal cannula oxygen Cardiovascular status: stable and blood pressure returned to baseline Postop Assessment: no apparent nausea or vomiting Anesthetic complications: no   No notable events documented.   Last Vitals:  Vitals:   08/10/22 1214 08/10/22 1220  BP: (!) 111/50 102/70  Pulse: 82 84  Resp: 17 15  Temp: 36.6 C 36.6 C  SpO2: 97% 97%    Last Pain:  Vitals:   08/10/22 1220  TempSrc:   PainSc: 0-No pain                 Natnael Biederman C Maizie Garno

## 2022-08-10 NOTE — Transfer of Care (Signed)
Immediate Anesthesia Transfer of Care Note  Patient: [name]  Procedure(s) Performed: CATARACT EXTRACTION PHACO AND INTRAOCULAR LENS PLACEMENT (IOC) LEFT DIABETIC  ASPIRE ENVISTA  LENS  4.39  00:36.5 (Left: Eye)  Patient Location: PACU  Anesthesia Type: MAC  Level of Consciousness: awake, alert  and patient cooperative  Airway and Oxygen Therapy: Patient Spontanous Breathing and Patient connected to supplemental oxygen  Post-op Assessment: Post-op Vital signs reviewed, Patient's Cardiovascular Status Stable, Respiratory Function Stable, Patent Airway and No signs of Nausea or vomiting  Post-op Vital Signs: Reviewed and stable  Complications: No notable events documented.

## 2022-08-11 ENCOUNTER — Encounter: Payer: Self-pay | Admitting: Ophthalmology

## 2022-08-11 DIAGNOSIS — H2511 Age-related nuclear cataract, right eye: Secondary | ICD-10-CM | POA: Diagnosis not present

## 2022-08-15 ENCOUNTER — Telehealth (HOSPITAL_BASED_OUTPATIENT_CLINIC_OR_DEPARTMENT_OTHER): Payer: Medicare PPO | Admitting: Pulmonary Disease

## 2022-08-15 DIAGNOSIS — G4733 Obstructive sleep apnea (adult) (pediatric): Secondary | ICD-10-CM

## 2022-08-15 NOTE — Telephone Encounter (Signed)
Will await response from [facility].

## 2022-08-15 NOTE — Anesthesia Preprocedure Evaluation (Addendum)
Anesthesia Evaluation  Patient identified by MRN, date of birth, ID band Patient awake    Reviewed: Allergy & Precautions, H&P , NPO status , Patient's Chart, lab work & pertinent test results  History of Anesthesia Complications (+) PONV and history of anesthetic complications  Airway Mallampati: III  TM Distance: >3 FB Neck ROM: Full    Dental no notable dental hx.    Pulmonary neg pulmonary ROS, asthma , sleep apnea    Pulmonary exam normal breath sounds clear to auscultation       Cardiovascular hypertension, +CHF  Normal cardiovascular exam Rhythm:Regular Rate:Normal   Echo 05-08-22 EF 50-55%, RWMA with possible inferoseptal and inferior hypokinesis, Grade I diastolic dysfunction     Neuro/Psych  Headaches  Anxiety      Neuromuscular disease negative neurological ROS  negative psych ROS   GI/Hepatic ,GERD  ,,  Endo/Other  diabetes    Renal/GU Renal disease  negative genitourinary   Musculoskeletal negative musculoskeletal ROS (+) Arthritis ,    Abdominal   Peds negative pediatric ROS (+)  Hematology negative hematology ROS (+) Blood dyscrasia, anemia   Anesthesia Other Findings Diabetes mellitus  Hypertension Anemia  Migraines Thyroid disease  Colon polyp IBS (irritable bowel syndrome) Fatigue Night sweats  Lump in female breast Asthma Sinus problem Reflux Methicillin resistant Staphylococcus aureus in conditions classified elsewhere and of unspecified site Paresthesias  Nonspecific abnormal results of thyroid function study Pure hypercholesterolemia  Type II or unspecified type diabetes mellitus without mention of complication, not stated as uncontrolled Morbid obesity (HCC) Other B-complex deficiencies Edema Unspecified sleep apnea Symptomatic states associated with artificial menopause Allergy Arthritis Anxiety Sacral fracture (HCC) Complication of anesthesia PONV (postoperative nausea and  vomiting) Amyotrophic lateral sclerosis (ALS) (HCC) Displaced fracture of proximal end of right fibula  Neuromuscular disorder (HCC) Cataract  CHF (congestive heart failure) (HCC) Sleep apnea  Cystitis with hematuria Bacteremia  Sore on toe    Reproductive/Obstetrics negative OB ROS                              Anesthesia Physical Anesthesia Plan  ASA: 3  Anesthesia Plan: MAC   Post-op Pain Management:    Induction: Intravenous  PONV Risk Score and Plan:   Airway Management Planned: Natural Airway and Nasal Cannula  Additional Equipment:   Intra-op Plan:   Post-operative Plan:   Informed Consent: I have reviewed the patients History and Physical, chart, labs and discussed the procedure including the risks, benefits and alternatives for the proposed anesthesia with the patient or authorized representative who has indicated his/her understanding and acceptance.     Dental Advisory Given  Plan Discussed with: Anesthesiologist, CRNA and Surgeon  Anesthesia Plan Comments: (Patient consented for risks of anesthesia including but not limited to:  - adverse reactions to medications - damage to eyes, teeth, lips or other oral mucosa - nerve damage due to positioning  - sore throat or hoarseness - Damage to heart, brain, nerves, lungs, other parts of body or loss of life  Patient voiced understanding.)         Anesthesia Quick Evaluation

## 2022-08-15 NOTE — Telephone Encounter (Signed)
Severe OSA /OHS vs hypoventilation Advise to start BiPAP at 20/14  cmH2O and follow central apneas on download Another alternative is to start auto-biPAP with EPAP min 10, PS +6, IPAP max 20

## 2022-08-15 NOTE — Telephone Encounter (Signed)
She has an office visit this week on May 30 will discuss results at office visit please make sure patient keeps office visit to discuss her severe sleep apnea

## 2022-08-16 NOTE — Telephone Encounter (Signed)
Spoke to patient and relayed below message/recommendations. She will keep scheduled visit.  Nothing further needed.

## 2022-08-17 ENCOUNTER — Encounter: Payer: Self-pay | Admitting: Adult Health

## 2022-08-17 ENCOUNTER — Telehealth: Payer: Medicare PPO | Admitting: Adult Health

## 2022-08-17 DIAGNOSIS — G4733 Obstructive sleep apnea (adult) (pediatric): Secondary | ICD-10-CM

## 2022-08-17 NOTE — Patient Instructions (Signed)
Begin BiPAP at night with sleep and naps, goal is to wear all night long Work on healthy weight loss .  Do not drive if sleepy  Use caution with sedating medications Follow up in 3 months with Dr. Wynona Neat in [place] office and As needed

## 2022-08-17 NOTE — Addendum Note (Signed)
Addended by: Lajoyce Lauber A on: 08/17/2022 02:41 PM   Modules accepted: Orders

## 2022-08-17 NOTE — Progress Notes (Signed)
Virtual Visit via Video Note  I connected with [name] on 08/17/22 at 11:00 AM EDT by a video enabled telemedicine application and verified that I am speaking with the correct person using two identifiers.  Location: Patient: Home  Provider: Office    I discussed the limitations of evaluation and management by telemedicine and the availability of in person appointments. The patient expressed understanding and agreed to proceed.  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  She underwent lap band surgery and has significant weight loss greater than 125 pounds.  Her snoring symptoms resolved and she stopped her CPAP therapy.  Approximately 5 years ago patient developed progressive muscle weakness to the point where she was unable to walk.  She has been seen at [facility] neurology and [facility] neurology.  Initially thought to have ALS but recently her diagnosis was changed and she was discharged from the ALS clinic.  As above was felt to have possible muscle weakness from a variety of neurological disorders .  Patient had previously been on noninvasive vent support with trilogy device for the last few years for sleep apnea and hypoventilation.  After she was discharged from the ALS clinic she could no longer afford this.  Patient was set up for an in lab split-night sleep study.  This showed severe obstructive sleep apnea with AHI at 72.2 with a SpO2 low at 77%.  Titration portion showed optimal control on BiPAP 20/14  cm H2O.  We discussed her sleep study results in detail.  Went over plan to begin BiPAP support at bedtime.  Patient is in agreement.  She says she is ready to get started back on a sleep device to help her.   Observations/Objective: Appears in no acute distress.  Assessment and Plan: Severe obstructive sleep apnea with nocturnal hypoxemia   Begin BiPAP support will begin auto BiPAP with EPAP minimum 10, pressure support 6, IPAP max 20cmH2O. Discussed with Dr. Vassie Loll  , patient does not need O2 with BIPAP .  Patient education given on sleep apnea and BiPAP care   - discussed how weight can impact sleep and risk for sleep disordered breathing - discussed options to assist with weight loss: combination of diet modification, cardiovascular and strength training exercises   - had an extensive discussion regarding the adverse health consequences related to untreated sleep disordered breathing - specifically discussed the risks for hypertension, coronary artery disease, cardiac dysrhythmias, cerebrovascular disease, and diabetes - lifestyle modification discussed   - discussed how sleep disruption can increase risk of accidents, particularly when driving - safe driving practices were discussed     Plan  Patient Instructions  Begin BiPAP at night with sleep and naps, goal is to wear all night long Work on healthy weight loss .  Do not drive if sleepy  Use caution with sedating medications Follow up in 3 months with Dr. Wynona Neat in [place] office and As needed       Follow Up Instructions:    I discussed the assessment and treatment plan with the patient. The patient was provided an opportunity to ask  questions and all were answered. The patient agreed with the plan and demonstrated an understanding of the instructions.   The patient was advised to call back or seek an in-person evaluation if the symptoms worsen or if the condition fails to improve as anticipated.  I provided 22  minutes of non-face-to-face time during this encounter.   Rubye Oaks, NP

## 2022-08-21 ENCOUNTER — Other Ambulatory Visit: Payer: Self-pay | Admitting: Primary Care

## 2022-08-21 DIAGNOSIS — I10 Essential (primary) hypertension: Secondary | ICD-10-CM

## 2022-08-21 DIAGNOSIS — R000 Tachycardia, unspecified: Secondary | ICD-10-CM

## 2022-08-21 DIAGNOSIS — M5116 Intervertebral disc disorders with radiculopathy, lumbar region: Secondary | ICD-10-CM

## 2022-08-21 DIAGNOSIS — M62838 Other muscle spasm: Secondary | ICD-10-CM

## 2022-08-21 MED ORDER — METOPROLOL TARTRATE 25 MG PO TABS
12.5000 mg | ORAL_TABLET | Freq: Two times a day (BID) | ORAL | 2 refills | Status: DC
Start: 2022-08-21 — End: 2023-07-23

## 2022-08-21 MED ORDER — CYCLOBENZAPRINE HCL 10 MG PO TABS
10.0000 mg | ORAL_TABLET | Freq: Three times a day (TID) | ORAL | 2 refills | Status: DC | PRN
Start: 2022-08-21 — End: 2023-12-11

## 2022-08-21 MED ORDER — GABAPENTIN 600 MG PO TABS
600.0000 mg | ORAL_TABLET | Freq: Two times a day (BID) | ORAL | 2 refills | Status: DC
Start: 2022-08-21 — End: 2023-08-06

## 2022-08-21 NOTE — Telephone Encounter (Signed)
From: [name] To: Office of Doreene Nest, NP Sent: 08/21/2022 10:35 AM EDT Subject: Medication Renewal Request  Refills have been requested for the following medications:   cyclobenzaprine (FLEXERIL) 10 MG tablet [Firmin Belisle K Eldra Word]   gabapentin (NEURONTIN) 600 MG tablet   metoprolol tartrate (LOPRESSOR) 25 MG tablet [Kennard Fildes K Nastassia Bazaldua]  Preferred pharmacy: [facility] [place] - [address] Delivery method: [employer]

## 2022-08-22 NOTE — Discharge Instructions (Signed)

## 2022-08-24 ENCOUNTER — Ambulatory Visit
Admission: RE | Admit: 2022-08-24 | Discharge: 2022-08-24 | Disposition: A | Discharge status: Home / Self Care | Payer: Medicare PPO | Attending: Ophthalmology | Admitting: Ophthalmology

## 2022-08-24 ENCOUNTER — Other Ambulatory Visit: Payer: Self-pay

## 2022-08-24 ENCOUNTER — Ambulatory Visit: Payer: Medicare PPO | Admitting: Anesthesiology

## 2022-08-24 ENCOUNTER — Encounter
Admission: RE | Disposition: A | Discharge status: Home / Self Care | Payer: Self-pay | Source: Home / Self Care | Attending: Ophthalmology

## 2022-08-24 ENCOUNTER — Encounter: Payer: Self-pay | Admitting: Ophthalmology

## 2022-08-24 DIAGNOSIS — D649 Anemia, unspecified: Secondary | ICD-10-CM | POA: Diagnosis not present

## 2022-08-24 DIAGNOSIS — H2511 Age-related nuclear cataract, right eye: Secondary | ICD-10-CM | POA: Diagnosis not present

## 2022-08-24 DIAGNOSIS — D759 Disease of blood and blood-forming organs, unspecified: Secondary | ICD-10-CM | POA: Insufficient documentation

## 2022-08-24 DIAGNOSIS — I110 Hypertensive heart disease with heart failure: Secondary | ICD-10-CM | POA: Diagnosis not present

## 2022-08-24 DIAGNOSIS — I509 Heart failure, unspecified: Secondary | ICD-10-CM | POA: Diagnosis not present

## 2022-08-24 DIAGNOSIS — E1136 Type 2 diabetes mellitus with diabetic cataract: Secondary | ICD-10-CM | POA: Diagnosis not present

## 2022-08-24 DIAGNOSIS — H2589 Other age-related cataract: Secondary | ICD-10-CM | POA: Diagnosis not present

## 2022-08-24 DIAGNOSIS — F419 Anxiety disorder, unspecified: Secondary | ICD-10-CM | POA: Insufficient documentation

## 2022-08-24 HISTORY — PX: CATARACT EXTRACTION W/PHACO: SHX586

## 2022-08-24 LAB — GLUCOSE, CAPILLARY: Glucose-Capillary: 112 mg/dL — ABNORMAL HIGH (ref 70–99)

## 2022-08-24 SURGERY — PHACOEMULSIFICATION, CATARACT, WITH IOL INSERTION
Anesthesia: Monitor Anesthesia Care | Site: Eye | Laterality: Right

## 2022-08-24 MED ORDER — SIGHTPATH DOSE#1 BSS IO SOLN
INTRAOCULAR | Status: DC | PRN
  Administered 2022-08-24: 51 mL via OPHTHALMIC

## 2022-08-24 MED ORDER — TETRACAINE HCL 0.5 % OP SOLN
1.0000 [drp] | OPHTHALMIC | Status: DC | PRN
  Administered 2022-08-24 (×3): 1 [drp] via OPHTHALMIC

## 2022-08-24 MED ORDER — LACTATED RINGERS IV SOLN: INTRAVENOUS | Status: DC

## 2022-08-24 MED ORDER — BRIMONIDINE TARTRATE-TIMOLOL 0.2-0.5 % OP SOLN
OPHTHALMIC | Status: DC | PRN
  Administered 2022-08-24: 1 [drp] via OPHTHALMIC

## 2022-08-24 MED ORDER — ARMC OPHTHALMIC DILATING DROPS
1.0000 | OPHTHALMIC | Status: DC | PRN
  Administered 2022-08-24 (×3): 1 via OPHTHALMIC

## 2022-08-24 MED ORDER — LIDOCAINE HCL (PF) 2 % IJ SOLN
INTRAOCULAR | Status: DC | PRN
  Administered 2022-08-24: 1 mL via INTRAOCULAR

## 2022-08-24 MED ORDER — MIDAZOLAM HCL 2 MG/2ML IJ SOLN
INTRAMUSCULAR | Status: DC | PRN
  Administered 2022-08-24: 2 mg via INTRAVENOUS

## 2022-08-24 MED ORDER — SIGHTPATH DOSE#1 NA HYALUR & NA CHOND-NA HYALUR IO KIT
PACK | INTRAOCULAR | Status: DC | PRN
  Administered 2022-08-24 (×2): 1 via OPHTHALMIC

## 2022-08-24 MED ORDER — SIGHTPATH DOSE#1 BSS IO SOLN
INTRAOCULAR | Status: DC | PRN
  Administered 2022-08-24: 15 mL via INTRAOCULAR

## 2022-08-24 MED ORDER — ONDANSETRON HCL 4 MG/2ML IJ SOLN
INTRAMUSCULAR | Status: DC | PRN
  Administered 2022-08-24: 4 mg via INTRAVENOUS

## 2022-08-24 SURGICAL SUPPLY — 10 items
CATARACT SUITE SIGHTPATH (MISCELLANEOUS) ×1 IMPLANT
DISSECTOR HYDRO NUCLEUS 50X22 (MISCELLANEOUS) ×2 IMPLANT
DRSG TEGADERM 2-3/8X2-3/4 SM (GAUZE/BANDAGES/DRESSINGS) ×2 IMPLANT
FEE CATARACT SUITE SIGHTPATH (MISCELLANEOUS) ×2 IMPLANT
GLOVE SURG SYN 7.5 E (GLOVE) ×1 IMPLANT
GLOVE SURG SYN 7.5 PF PI (GLOVE) ×2 IMPLANT
GLOVE SURG SYN 8.5 E (GLOVE) ×1 IMPLANT
GLOVE SURG SYN 8.5 PF PI (GLOVE) ×2 IMPLANT
LENS ENVISTA ASPIRE 17.0 (Intraocular Lens) ×1 IMPLANT
LENS IOL ENVISTA ASPR 17.0 (Intraocular Lens) IMPLANT

## 2022-08-24 NOTE — H&P (Signed)
[facility]   Primary Care Physician:  Doreene Nest, NP Ophthalmologist: Dr. Deberah Pelton  Pre-Procedure History & Physical: HPI:  [name] is a 57 y.o. female here for cataract surgery.   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   Anemia    Anxiety    Arthritis    Asthma    Bacteremia 05/09/2022   Cataract    CHF (congestive heart failure) (HCC)    Colon polyp    Complication of anesthesia    Cystitis with hematuria 10/24/2021   Diabetes mellitus    Displaced fracture of proximal end of right fibula 07/21/2017   Edema    Fatigue    Hypertension    IBS (irritable bowel syndrome)    Lump in female breast    Methicillin resistant Staphylococcus aureus in conditions classified elsewhere and of unspecified site    Migraines    Morbid obesity (HCC)    Neuromuscular disorder (HCC)    Night sweats    Nonspecific abnormal results of thyroid function study    Other B-complex deficiencies    Paresthesias    PONV (postoperative nausea and vomiting)    Pure hypercholesterolemia    Reflux    Sacral fracture (HCC)    Sinus problem    Sleep apnea    Sore on toe 05/17/2021   Symptomatic states associated with artificial menopause    Thyroid disease    Type II or unspecified type diabetes mellitus without mention of complication, not stated as uncontrolled    Unspecified sleep apnea     Past Surgical History:  Procedure Laterality Date   abcess removal  03/21/1995   MRSA   ABDOMINAL HYSTERECTOMY  03/21/1995   complete in 1997, partial was in 1995   carpal tunnel release r  07/03/2013   Dalldorf   CATARACT EXTRACTION W/PHACO Left 08/10/2022   Procedure: CATARACT EXTRACTION PHACO AND INTRAOCULAR LENS PLACEMENT (IOC) LEFT DIABETIC  ASPIRE ENVISTA  LENS  4.39  00:36.5;  Surgeon: Estanislado Pandy, MD;  Location: [facility];  Service: Ophthalmology;  Laterality: Left;   CESAREAN  SECTION  1987  and 1989   CHOLECYSTECTOMY  03/20/1989   COLONOSCOPY  03/21/2011   Normal.  [facility].   KNEE SURGERY  03/20/2004   LAPAROSCOPIC GASTRIC BANDING  05/18/2009   SPINE SURGERY     TONSILLECTOMY AND ADENOIDECTOMY  03/20/1972   TUBAL LIGATION     ULNAR TUNNEL RELEASE Right 02/05/2018   Procedure: CUBITAL TUNNEL RELEASE/DECOMPRESSION;  Surgeon: Marcene Corning, MD;  Location: [facility] OR;  Service: Orthopedics;  Laterality: Right;    Prior to Admission medications   Medication Sig Start Date End Date Taking? Authorizing Provider  acetaminophen (TYLENOL) 325 MG tablet Take 650 mg by mouth every 6 (six) hours as needed.    [provider]  albuterol (PROVENTIL) (2.5 MG/3ML) 0.083% nebulizer solution Take 3 mLs (2.5 mg total) by nebulization every 6 (six) hours as needed for wheezing or shortness of breath. 01/12/22   Joaquim Nam, MD  albuterol (VENTOLIN HFA) 108 (90 Base) MCG/ACT inhaler Inhale 1-2 puffs into the lungs every 6 (six) hours as needed for wheezing or shortness of breath. 01/12/22   Joaquim Nam, MD  atorvastatin (LIPITOR) 40 MG tablet Take 1 tablet (40 mg total) by mouth daily. for cholesterol. 08/12/21   Doreene Nest, NP  Bacillus Coagulans-Inulin (PROBIOTIC-PREBIOTIC PO) Take by mouth.  [provider]  Continuous Blood Gluc Receiver (DEXCOM G7 RECEIVER) DEVI by Does not apply route.    [provider]  Continuous Blood Gluc Sensor (DEXCOM G7 SENSOR) MISC by Does not apply route.    [provider]  cyclobenzaprine (FLEXERIL) 10 MG tablet Take 1 tablet (10 mg total) by mouth 3 (three) times daily as needed for muscle spasms. 08/21/22   Doreene Nest, NP  diphenhydramine-acetaminophen (TYLENOL PM) 25-500 MG TABS tablet Take 1 tablet by mouth at bedtime.    [provider]  Dulaglutide 4.5 MG/0.5ML SOPN Inject into the skin. 05/18/20   [provider]  EPINEPHrine (EPIPEN 2-PAK) 0.3 mg/0.3 mL IJ SOAJ  injection Inject 0.3 mg into the muscle as needed for anaphylaxis. 05/14/22   Zigmund Daniel., MD  ergocalciferol (VITAMIN D2) 1.25 MG (50000 UT) capsule Take by mouth. 11/14/18   [provider]  fexofenadine (ALLEGRA) 180 MG tablet 1 tablet    [provider]  FLUoxetine (PROZAC) 40 MG capsule TAKE ONE CAPSULE BY MOUTH ONCE DAILY FOR ANXIETY AND DEPRESSION 07/23/22   Doreene Nest, NP  fluticasone (FLONASE) 50 MCG/ACT nasal spray Place 1 spray into both nostrils 2 (two) times daily. 05/25/20   Doreene Nest, NP  gabapentin (NEURONTIN) 600 MG tablet Take 1 tablet (600 mg total) by mouth 2 (two) times daily. For pain 08/21/22   Doreene Nest, NP  hydrochlorothiazide (HYDRODIURIL) 12.5 MG tablet TAKE ONE TABLET BY MOUTH ONCE DAILY FOR BLOOD PRESSURE 06/15/22   Doreene Nest, NP  insulin aspart (NOVOLOG FLEXPEN) 100 UNIT/ML FlexPen Inject 22-26 Units into the skin 3 (three) times daily with meals. 12/01/19   Doreene Nest, NP  insulin degludec (TRESIBA) 100 UNIT/ML FlexTouch Pen Inject 40 Units into the skin 2 (two) times daily. 01/12/22   Joaquim Nam, MD  Insulin Pen Needle 32G X 4 MM MISC Use 1 needle with pen as directed 09/20/19   Doreene Nest, NP  levocetirizine (XYZAL) 5 MG tablet TAKE ONE TABLET BY MOUTH EVERY EVENING 07/16/17   Doreene Nest, NP  losartan (COZAAR) 50 MG tablet TAKE ONE TABLET BY MOUTH ONCE DAILY FOR BLOOD PRESSURE 06/15/22   Doreene Nest, NP  metoprolol tartrate (LOPRESSOR) 25 MG tablet Take 0.5 tablets (12.5 mg total) by mouth 2 (two) times daily. for blood pressure. 08/21/22   Doreene Nest, NP  Multiple Vitamin (MULTIVITAMIN PO) Take 1 tablet by mouth daily.     [provider]  Nebulizer MISC Dispense 1 nebulizer for home use, with tubing and supplies. J45.909. 01/12/22   Joaquim Nam, MD  ondansetron (ZOFRAN-ODT) 4 MG disintegrating tablet TAKE 1 TABLET BY MOUTH EVERY 8 HOURS AS NEEDED FOR NAUSEA AS  DIRECTED. 04/16/22   Doreene Nest, NP  VALERIAN ROOT PO Take 1,000 mg by mouth at bedtime.    [provider]    Allergies as of 06/13/2022 - Review Complete 05/19/2022  Allergen Reaction Noted   Amoxicillin Shortness Of Breath and Rash 10/16/2011   Bee venom Anaphylaxis 07/20/2017   Levofloxacin Shortness Of Breath and Rash 06/27/2007   Meloxicam Hives and Rash 08/27/2013   Vancomycin Anaphylaxis    Sulfa antibiotics Rash 12/20/2015   Ace inhibitors Cough 02/21/2012   Ciprofloxacin Hives 05/14/2022   Codeine  02/21/2012   Meperidine hcl     Oxycodone-acetaminophen     Tyloxapol  01/12/2021   Penicillins Swelling and Rash    Tequin [gatifloxacin]  Rash 10/16/2011    Family History  Problem Relation Age of Onset   Diabetes Mother    Heart disease Mother 10       CHF, CAD   Other Mother        muscle disease   Hypertension Mother    Hyperlipidemia Mother    Arthritis Mother        OA hip L s/p THR   Leukemia Mother 77   Kidney disease Mother    Diabetes Father    Heart disease Father        AMI/CABG/valve replacement age 57.   Stroke Father    Diabetes Brother    Heart disease Brother        stent age 47.   Hypertension Brother    Obesity Brother    Stroke Brother    Cancer Paternal Aunt    Varicose Veins Paternal Aunt    Alzheimer's disease Maternal Grandmother    Emphysema Maternal Grandfather    Heart disease Paternal Grandmother    Fibromyalgia Daughter    Crohn's disease Daughter    Asthma Daughter    Obesity Daughter    Asthma Daughter    Hypertension Son    Breast cancer Other        2 Aunts    Social History   Socioeconomic History   Marital status: Married    Spouse name: Not on file   Number of children: 3   Years of education: college   Highest education level: Not on file  Occupational History   Occupation: Animal nutritionist: [employer]  Tobacco Use   Smoking status: Never   Smokeless tobacco: Never  Vaping  Use   Vaping Use: Never used  Substance and Sexual Activity   Alcohol use: Yes    Comment: Socially   Drug use: No   Sexual activity: Not Currently    Birth control/protection: None    Comment: Hysterectomy  Other Topics Concern   Not on file  Social History Narrative   Exercise: walking two days per week, 30 minutes.       Married x 29 years, happily married; no abuse.       Children: 3 children; 1 grandchild; two step grandchildren; 1 gg.      Lives: with husband, mother-in-law.      Employment:  [employer] x 1996; Advice worker.  Loves work.      Tobacco: never      Alcohol: socially.  One glass of wine per month.      Exercise:  Walking around campus.        Seatbelt: 100%      Guns: gun safe in garage.   Social Determinants of Health   Financial Resource Strain: Low Risk  (09/30/2021)   Overall Financial Resource Strain (CARDIA)    Difficulty of Paying Living Expenses: Not hard at all  Food Insecurity: No Food Insecurity (05/15/2022)   Hunger Vital Sign    Worried About Running Out of Food in the Last Year: Never true    Ran Out of Food in the Last Year: Never true  Transportation Needs: No Transportation Needs (05/15/2022)   PRAPARE - Administrator, Civil Service (Medical): No    Lack of Transportation (Non-Medical): No  Physical Activity: Inactive (09/30/2021)   Exercise Vital Sign    Days of Exercise per Week: 0 days    Minutes of Exercise per Session: 0 min  Stress: No Stress  Concern Present (09/30/2021)   [employer] of Occupational Health - Occupational Stress Questionnaire    Feeling of Stress : Not at all  Social Connections: Not on file  Intimate Partner Violence: Not At Risk (05/11/2022)   Humiliation, Afraid, Rape, and Kick questionnaire    Fear of Current or Ex-Partner: No    Emotionally Abused: No    Physically Abused: No    Sexually Abused: No    Review of Systems: See HPI, otherwise negative ROS  Physical Exam: There were no vitals  taken for this visit. General:   Alert, cooperative in NAD Head:  Normocephalic and atraumatic. Respiratory:  Normal work of breathing. Cardiovascular:  RRR  Impression/Plan: [name] is here for cataract surgery.  Risks, benefits, limitations, and alternatives regarding cataract surgery have been reviewed with the patient.  Questions have been answered.  All parties agreeable.   Estanislado Pandy, MD  08/24/2022, 7:22 AM

## 2022-08-24 NOTE — Anesthesia Postprocedure Evaluation (Signed)
Anesthesia Post Note  Patient: [name]  Procedure(s) Performed: CATARACT EXTRACTION PHACO AND INTRAOCULAR LENS PLACEMENT (IOC) RIGHT DIABETIC  ASPIRE ENVISTA LENS  5.09  00:31.3 (Right: Eye)  Patient location during evaluation: PACU Anesthesia Type: MAC Level of consciousness: awake and alert Pain management: pain level controlled Vital Signs Assessment: post-procedure vital signs reviewed and stable Respiratory status: spontaneous breathing, nonlabored ventilation, respiratory function stable and patient connected to nasal cannula oxygen Cardiovascular status: stable and blood pressure returned to baseline Postop Assessment: no apparent nausea or vomiting Anesthetic complications: no   No notable events documented.   Last Vitals:  Vitals:   08/24/22 1158 08/24/22 1203  BP: (!) 142/73 134/63  Pulse: 84 85  Resp: 14 13  Temp: (!) 36.1 C (!) 36.3 C  SpO2: 100% 97%    Last Pain:  Vitals:   08/24/22 1203  TempSrc:   PainSc: 0-No pain                 Naeem Quillin C Gladies Sofranko

## 2022-08-24 NOTE — Op Note (Signed)
OPERATIVE NOTE  [name] [id number] 08/24/2022   PREOPERATIVE DIAGNOSIS: Nuclear sclerotic cataract right eye. H25.11   POSTOPERATIVE DIAGNOSIS: Nuclear sclerotic cataract right eye. H25.11   PROCEDURE:  Phacoemusification with posterior chamber intraocular lens placement of the right eye  Ultrasound time: Procedure(s): CATARACT EXTRACTION PHACO AND INTRAOCULAR LENS PLACEMENT (IOC) RIGHT DIABETIC  ASPIRE ENVISTA LENS  5.09  00:31.3 (Right)  LENS:   Implant Name Type Inv. Item Serial No. Manufacturer Lot No. LRB No. Used Action  ENVISTA   [id number]  [id number] Right 1 Implanted  LENS ENVISTA TRC 125 17.0 - [id number]  LENS ENVISTA TRC 125 17.0 [id number] [facility]  Right 1 Implanted      SURGEON:  Julious Payer. Rolley Sims, MD   ANESTHESIA:  Topical with tetracaine drops, augmented with 1% preservative-free intracameral lidocaine.   COMPLICATIONS:  None.   DESCRIPTION OF PROCEDURE:  The patient was identified in the holding room and transported to the operating room and placed in the supine position under the operating microscope.  The right eye was identified as the operative eye, which was prepped and draped in the usual sterile ophthalmic fashion.   A 1 millimeter clear-corneal paracentesis was made superotemporally. Preservative-free 1% lidocaine mixed with 1:1,000 bisulfite-free aqueous solution of epinephrine was injected into the anterior chamber. Due to dense cortical spokes, the red reflex was impaired. Trypan blue was injected intracamerally to stain the anterior capsule and facilitate safe creation of the capsulorrhexis. The anterior chamber was then filled with Viscoat viscoelastic. A 2.4 millimeter keratome was used to make a clear-corneal incision inferotemporally. A curvilinear capsulorrhexis was made with a cystotome and capsulorrhexis forceps. Balanced salt solution was used to hydrodissect and hydrodelineate the nucleus. Phacoemulsification was then used to remove the lens  nucleus and epinucleus. The remaining cortex was then removed using the irrigation and aspiration handpiece. Provisc was then placed into the capsular bag to distend it for lens placement. A +17.00 D EA intraocular lens was then injected into the capsular bag. The remaining viscoelastic was aspirated.   Wounds were hydrated with balanced salt solution.  The anterior chamber was inflated to a physiologic pressure with balanced salt solution.  No wound leaks were noted. A small aliquot of Vigamox was injected intracamerally.  Timolol and Brimonidine drops were applied to the eye.  The patient was taken to the recovery room in stable condition without complications of anesthesia or surgery.  [name] 08/24/2022, 11:56 AM

## 2022-08-24 NOTE — Transfer of Care (Signed)
Immediate Anesthesia Transfer of Care Note  Patient: [name]  Procedure(s) Performed: CATARACT EXTRACTION PHACO AND INTRAOCULAR LENS PLACEMENT (IOC) RIGHT DIABETIC  ASPIRE ENVISTA LENS  5.09  00:31.3 (Right: Eye)  Patient Location: PACU  Anesthesia Type: MAC  Level of Consciousness: awake, alert  and patient cooperative  Airway and Oxygen Therapy: Patient Spontanous Breathing and Patient connected to supplemental oxygen  Post-op Assessment: Post-op Vital signs reviewed, Patient's Cardiovascular Status Stable, Respiratory Function Stable, Patent Airway and No signs of Nausea or vomiting  Post-op Vital Signs: Reviewed and stable  Complications: No notable events documented.

## 2022-08-25 ENCOUNTER — Encounter: Payer: Self-pay | Admitting: Ophthalmology

## 2022-08-25 DIAGNOSIS — G4733 Obstructive sleep apnea (adult) (pediatric): Secondary | ICD-10-CM | POA: Diagnosis not present

## 2022-08-31 DIAGNOSIS — R531 Weakness: Secondary | ICD-10-CM | POA: Diagnosis not present

## 2022-09-04 ENCOUNTER — Other Ambulatory Visit: Payer: Self-pay | Admitting: Primary Care

## 2022-09-04 DIAGNOSIS — I10 Essential (primary) hypertension: Secondary | ICD-10-CM

## 2022-09-18 DIAGNOSIS — E1165 Type 2 diabetes mellitus with hyperglycemia: Secondary | ICD-10-CM | POA: Diagnosis not present

## 2022-09-24 DIAGNOSIS — G4733 Obstructive sleep apnea (adult) (pediatric): Secondary | ICD-10-CM | POA: Diagnosis not present

## 2022-09-25 ENCOUNTER — Other Ambulatory Visit: Payer: Self-pay | Admitting: Primary Care

## 2022-09-25 DIAGNOSIS — I10 Essential (primary) hypertension: Secondary | ICD-10-CM

## 2022-10-02 DIAGNOSIS — G4733 Obstructive sleep apnea (adult) (pediatric): Secondary | ICD-10-CM | POA: Diagnosis not present

## 2022-10-03 ENCOUNTER — Ambulatory Visit (INDEPENDENT_AMBULATORY_CARE_PROVIDER_SITE_OTHER): Payer: Medicare PPO

## 2022-10-03 VITALS — Ht 66.5 in | Wt 311.0 lb

## 2022-10-03 DIAGNOSIS — Z0000 Encounter for general adult medical examination without abnormal findings: Secondary | ICD-10-CM

## 2022-10-03 DIAGNOSIS — Z1231 Encounter for screening mammogram for malignant neoplasm of breast: Secondary | ICD-10-CM

## 2022-10-03 NOTE — Patient Instructions (Addendum)
[name] , Thank you for taking time to come for your Medicare Wellness Visit. I appreciate your ongoing commitment to your health goals. Please review the following plan we discussed and let me know if I can assist you in the future.   These are the goals we discussed:  Goals       Patient Stated      09/30/2021, wants to strengthen upper and lower body with chair exercise      Patient Stated (pt-stated)      To eat healthier         This is a list of the screening recommended for you and due dates:  Health Maintenance  Topic Date Due   Zoster (Shingles) Vaccine (1 of 2) Never done   Pap Smear  08/28/2014   COVID-19 Vaccine (4 - 2023-24 season) 11/18/2021   Yearly kidney health urinalysis for diabetes  06/15/2022   Flu Shot  10/19/2022   Hemoglobin A1C  11/06/2022   Complete foot exam   06/09/2023   Eye exam for diabetics  06/09/2023   Yearly kidney function blood test for diabetes  06/14/2023   Medicare Annual Wellness Visit  10/03/2023   Mammogram  11/17/2023   DTaP/Tdap/Td vaccine (3 - Td or Tdap) 07/28/2030   Colon Cancer Screening  09/18/2031   Hepatitis C Screening  Completed   HIV Screening  Completed   HPV Vaccine  Aged Out    Advanced directives: Advance directive discussed with you today. Even though you declined this today, please call our office should you change your mind, and we can give you the proper paperwork for you to fill out. Advance care planning is a way to make decisions about medical care that fits your values in case you are ever unable to make these decisions for yourself.  Information on Advanced Care Planning can be found at [facility] Advance Health Care Directives Advance Health Care Directives ([email or web address]    Conditions/risks identified:  You have an order for:  []   2D Mammogram  [x]   3D Mammogram  []   Bone Density     Please call for appointment:   [facility] [address]  [place], [place] [address] [phone]  Make sure to wear two-piece clothing.  No lotions powders or deodorants the day of the appointment Make sure to bring picture ID and insurance card.  Bring list of medications you are currently taking including any supplements.   Schedule your [place] screening mammogram through MyChart!   Log into your MyChart account.  Go to 'Visit' (or 'Appointments' if on mobile App) --> Schedule an Appointment  Under 'Select a Reason for Visit' choose the Mammogram Screening option.  Complete the pre-visit questions and select the time and place that best fits your schedule.    Next appointment: VIRTUAL/TELEPHONE APPOINTMENT Follow up in one year for your annual wellness visit.  October 08, 2023 at 11:15am telephone visit  Preventive Care 40-64 Years, Female Preventive care refers to lifestyle choices and visits with your health care provider that can promote health and wellness. What does preventive care include? A yearly physical exam. This is also called an annual well check. Dental exams once or twice a year. Routine eye exams. Ask your health care provider how often you should have your eyes checked. Personal lifestyle choices, including: Daily care of your teeth and gums. Regular physical activity. Eating a healthy diet. Avoiding tobacco and drug use. Limiting alcohol use.  Practicing safe sex. Taking low-dose aspirin daily starting at age 30. Taking vitamin and mineral supplements as recommended by your health care provider. What happens during an annual well check? The services and screenings done by your health care provider during your annual well check will depend on your age, overall health, lifestyle risk factors, and family history of disease. Counseling  Your health care provider may ask you questions about your: Alcohol use. Tobacco use. Drug use. Emotional well-being. Home and relationship well-being. Sexual  activity. Eating habits. Work and work [employer]. Method of birth control. Menstrual cycle. Pregnancy history. Screening  You may have the following tests or measurements: Height, weight, and BMI. Blood pressure. Lipid and cholesterol levels. These may be checked every 5 years, or more frequently if you are over 82 years old. Skin check. Lung cancer screening. You may have this screening every year starting at age 78 if you have a 30-pack-year history of smoking and currently smoke or have quit within the past 15 years. Fecal occult blood test (FOBT) of the stool. You may have this test every year starting at age 61. Flexible sigmoidoscopy or colonoscopy. You may have a sigmoidoscopy every 5 years or a colonoscopy every 10 years starting at age 61. Hepatitis C blood test. Hepatitis B blood test. Sexually transmitted disease (STD) testing. Diabetes screening. This is done by checking your blood sugar (glucose) after you have not eaten for a while (fasting). You may have this done every 1-3 years. Mammogram. This may be done every 1-2 years. Talk to your health care provider about when you should start having regular mammograms. This may depend on whether you have a family history of breast cancer. BRCA-related cancer screening. This may be done if you have a family history of breast, ovarian, tubal, or peritoneal cancers. Pelvic exam and Pap test. This may be done every 3 years starting at age 16. Starting at age 41, this may be done every 5 years if you have a Pap test in combination with an HPV test. Bone density scan. This is done to screen for osteoporosis. You may have this scan if you are at high risk for osteoporosis. Discuss your test results, treatment options, and if necessary, the need for more tests with your health care provider. Vaccines  Your health care provider may recommend certain vaccines, such as: Influenza vaccine. This is recommended every year. Tetanus, diphtheria,  and acellular pertussis (Tdap, Td) vaccine. You may need a Td booster every 10 years. Zoster vaccine. You may need this after age 44. Pneumococcal 13-valent conjugate (PCV13) vaccine. You may need this if you have certain conditions and were not previously vaccinated. Pneumococcal polysaccharide (PPSV23) vaccine. You may need one or two doses if you smoke cigarettes or if you have certain conditions. Talk to your health care provider about which screenings and vaccines you need and how often you need them. This information is not intended to replace advice given to you by your health care provider. Make sure you discuss any questions you have with your health care provider. Document Released: 04/02/2015 Document Revised: 11/24/2015 Document Reviewed: 01/05/2015 Elsevier Interactive Patient Education  2017 [employer].    Fall Prevention in the Home Falls can cause injuries. They can happen to people of all ages. There are many things you can do to make your home safe and to help prevent falls. What can I do on the outside of my home? Regularly fix the edges of walkways and driveways and fix any  cracks. Remove anything that might make you trip as you walk through a door, such as a raised step or threshold. Trim any bushes or trees on the path to your home. Use bright outdoor lighting. Clear any walking paths of anything that might make someone trip, such as rocks or tools. Regularly check to see if handrails are loose or broken. Make sure that both sides of any steps have handrails. Any raised decks and porches should have guardrails on the edges. Have any leaves, snow, or ice cleared regularly. Use sand or salt on walking paths during winter. Clean up any spills in your garage right away. This includes oil or grease spills. What can I do in the bathroom? Use night lights. Install grab bars by the toilet and in the tub and shower. Do not use towel bars as grab bars. Use non-skid mats or  decals in the tub or shower. If you need to sit down in the shower, use a plastic, non-slip stool. Keep the floor dry. Clean up any water that spills on the floor as soon as it happens. Remove soap buildup in the tub or shower regularly. Attach bath mats securely with double-sided non-slip rug tape. Do not have throw rugs and other things on the floor that can make you trip. What can I do in the bedroom? Use night lights. Make sure that you have a light by your bed that is easy to reach. Do not use any sheets or blankets that are too big for your bed. They should not hang down onto the floor. Have a firm chair that has side arms. You can use this for support while you get dressed. Do not have throw rugs and other things on the floor that can make you trip. What can I do in the kitchen? Clean up any spills right away. Avoid walking on wet floors. Keep items that you use a lot in easy-to-reach places. If you need to reach something above you, use a strong step stool that has a grab bar. Keep electrical cords out of the way. Do not use floor polish or wax that makes floors slippery. If you must use wax, use non-skid floor wax. Do not have throw rugs and other things on the floor that can make you trip. What can I do with my stairs? Do not leave any items on the stairs. Make sure that there are handrails on both sides of the stairs and use them. Fix handrails that are broken or loose. Make sure that handrails are as long as the stairways. Check any carpeting to make sure that it is firmly attached to the stairs. Fix any carpet that is loose or worn. Avoid having throw rugs at the top or bottom of the stairs. If you do have throw rugs, attach them to the floor with carpet tape. Make sure that you have a light switch at the top of the stairs and the bottom of the stairs. If you do not have them, ask someone to add them for you. What else can I do to help prevent falls? Wear shoes that: Do not  have high heels. Have rubber bottoms. Are comfortable and fit you well. Are closed at the toe. Do not wear sandals. If you use a stepladder: Make sure that it is fully opened. Do not climb a closed stepladder. Make sure that both sides of the stepladder are locked into place. Ask someone to hold it for you, if possible. Clearly mark and make sure  that you can see: Any grab bars or handrails. First and last steps. Where the edge of each step is. Use tools that help you move around (mobility aids) if they are needed. These include: Canes. Walkers. Scooters. Crutches. Turn on the lights when you go into a dark area. Replace any light bulbs as soon as they burn out. Set up your furniture so you have a clear path. Avoid moving your furniture around. If any of your floors are uneven, fix them. If there are any pets around you, be aware of where they are. Review your medicines with your doctor. Some medicines can make you feel dizzy. This can increase your chance of falling. Ask your doctor what other things that you can do to help prevent falls. This information is not intended to replace advice given to you by your health care provider. Make sure you discuss any questions you have with your health care provider. Document Released: 12/31/2008 Document Revised: 08/12/2015 Document Reviewed: 04/10/2014 Elsevier Interactive Patient Education  2017 [employer].

## 2022-10-03 NOTE — Progress Notes (Signed)
Because this visit was a virtual/telehealth visit,  certain criteria was not obtained, such a blood pressure, CBG if patient is a diabetic, and timed up and go. Any medications not marked as "taking" was not mentioned during the medication reconciliation part of the visit. Any vitals not documented were not able to be obtained due to this being a telehealth visit. Vitals documented are verbally provided by the patient.   Subjective:   [name] is a 57 y.o. female who presents for Medicare Annual (Subsequent) preventive examination.  Visit Complete: Virtual  I connected with  [name] on 10/03/22 by a audio enabled telemedicine application and verified that I am speaking with the correct person using two identifiers.  Patient Location: Home  Provider Location: Home Office  I discussed the limitations of evaluation and management by telemedicine. The patient expressed understanding and agreed to proceed.  Patient Medicare AWV questionnaire was completed by the patient on 10/03/2022; I have confirmed that all information answered by patient is correct and no changes since this date.  Review of Systems     Cardiac Risk Factors include: diabetes mellitus;dyslipidemia;hypertension;sedentary lifestyle;obesity (BMI >30kg/m2)     Objective:    Today's Vitals   10/03/22 1112  Weight: (!) 311 lb (141.1 kg)  Height: 5' 6.5" (1.689 m)   Body mass index is 49.44 kg/m.     10/03/2022   11:12 AM 08/24/2022    9:17 AM 08/10/2022   10:24 AM 05/11/2022   11:00 AM 09/30/2021    3:04 PM 09/24/2018   10:10 AM 08/06/2018   11:09 AM  Advanced Directives  Does Patient Have a Medical Advance Directive? No No No No No Yes Yes  Type of Engineer, drilling of [place];Living will  Would patient like information on creating a medical advance directive? No - Patient declined No - Patient declined Yes (MAU/Ambulatory/Procedural Areas - Information  given) No - Patient declined       Current Medications (verified) Outpatient Encounter Medications as of 10/03/2022  Medication Sig   acetaminophen (TYLENOL) 325 MG tablet Take 650 mg by mouth every 6 (six) hours as needed.   albuterol (PROVENTIL) (2.5 MG/3ML) 0.083% nebulizer solution Take 3 mLs (2.5 mg total) by nebulization every 6 (six) hours as needed for wheezing or shortness of breath.   albuterol (VENTOLIN HFA) 108 (90 Base) MCG/ACT inhaler Inhale 1-2 puffs into the lungs every 6 (six) hours as needed for wheezing or shortness of breath.   atorvastatin (LIPITOR) 40 MG tablet Take 1 tablet (40 mg total) by mouth daily. for cholesterol.   Bacillus Coagulans-Inulin (PROBIOTIC-PREBIOTIC PO) Take by mouth.   budesonide (ENTOCORT EC) 3 MG 24 hr capsule Take 9 mg by mouth daily.   Continuous Blood Gluc Receiver (DEXCOM G7 RECEIVER) DEVI by Does not apply route.   Continuous Blood Gluc Sensor (DEXCOM G7 SENSOR) MISC by Does not apply route.   cyclobenzaprine (FLEXERIL) 10 MG tablet Take 1 tablet (10 mg total) by mouth 3 (three) times daily as needed for muscle spasms.   diphenhydramine-acetaminophen (TYLENOL PM) 25-500 MG TABS tablet Take 1 tablet by mouth at bedtime.   Dulaglutide 4.5 MG/0.5ML SOPN Inject into the skin.   EPINEPHrine (EPIPEN 2-PAK) 0.3 mg/0.3 mL IJ SOAJ injection Inject 0.3 mg into the muscle as needed for anaphylaxis.   ergocalciferol (VITAMIN D2) 1.25 MG (50000 UT) capsule Take by mouth.   fexofenadine (ALLEGRA) 180 MG tablet 1  tablet   FLUoxetine (PROZAC) 40 MG capsule TAKE ONE CAPSULE BY MOUTH ONCE DAILY FOR ANXIETY AND DEPRESSION   fluticasone (FLONASE) 50 MCG/ACT nasal spray Place 1 spray into both nostrils 2 (two) times daily.   gabapentin (NEURONTIN) 600 MG tablet Take 1 tablet (600 mg total) by mouth 2 (two) times daily. For pain   hydrochlorothiazide (HYDRODIURIL) 12.5 MG tablet TAKE ONE TABLET BY MOUTH ONCE DAILY FOR BLOOD PRESSURE   insulin aspart (NOVOLOG  FLEXPEN) 100 UNIT/ML FlexPen Inject 22-26 Units into the skin 3 (three) times daily with meals.   insulin degludec (TRESIBA) 100 UNIT/ML FlexTouch Pen Inject 40 Units into the skin 2 (two) times daily.   Insulin Pen Needle 32G X 4 MM MISC Use 1 needle with pen as directed   levocetirizine (XYZAL) 5 MG tablet TAKE ONE TABLET BY MOUTH EVERY EVENING   losartan (COZAAR) 50 MG tablet TAKE ONE TABLET BY MOUTH ONCE DAILY FOR BLOOD PRESSURE   metoprolol tartrate (LOPRESSOR) 25 MG tablet Take 0.5 tablets (12.5 mg total) by mouth 2 (two) times daily. for blood pressure.   Multiple Vitamin (MULTIVITAMIN PO) Take 1 tablet by mouth daily.    Nebulizer MISC Dispense 1 nebulizer for home use, with tubing and supplies. J45.909.   ondansetron (ZOFRAN-ODT) 4 MG disintegrating tablet TAKE 1 TABLET BY MOUTH EVERY 8 HOURS AS NEEDED FOR NAUSEA AS DIRECTED.   VALERIAN ROOT PO Take 1,000 mg by mouth at bedtime.   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   Anemia    Anxiety    Arthritis    Asthma    Bacteremia 05/09/2022   Cataract    CHF (congestive heart failure) (HCC)    Colon polyp    Complication of anesthesia    Cystitis with hematuria 10/24/2021   Diabetes mellitus    Displaced fracture of proximal end of right fibula 07/21/2017   Edema    Fatigue    Hypertension    IBS (irritable bowel syndrome)    Lump in female breast    Methicillin resistant Staphylococcus aureus in conditions classified elsewhere and of unspecified site    Migraines     Morbid obesity (HCC)    Neuromuscular disorder (HCC)    Night sweats    Nonspecific abnormal results of thyroid function study    Other B-complex deficiencies    Paresthesias    PONV (postoperative nausea and vomiting)    Pure hypercholesterolemia    Reflux    Sacral fracture (HCC)    Sinus problem    Sleep apnea    Sore on toe 05/17/2021   Symptomatic states associated with artificial menopause    Thyroid disease    Type II or unspecified type diabetes mellitus without mention of complication, not stated as uncontrolled    Unspecified sleep apnea    Past Surgical History:  Procedure Laterality Date   abcess removal  03/21/1995   MRSA   ABDOMINAL HYSTERECTOMY  03/21/1995   complete in 1997, partial was in 1995   carpal tunnel release r  07/03/2013   Dalldorf   CATARACT EXTRACTION W/PHACO Left 08/10/2022   Procedure: CATARACT EXTRACTION PHACO  AND INTRAOCULAR LENS PLACEMENT (IOC) LEFT DIABETIC  ASPIRE ENVISTA  LENS  4.39  00:36.5;  Surgeon: Estanislado Pandy, MD;  Location: [facility];  Service: Ophthalmology;  Laterality: Left;   CATARACT EXTRACTION W/PHACO Right 08/24/2022   Procedure: CATARACT EXTRACTION PHACO AND INTRAOCULAR LENS PLACEMENT (IOC) RIGHT DIABETIC  ASPIRE ENVISTA LENS  5.09  00:31.3;  Surgeon: Estanislado Pandy, MD;  Location[facility];  Service: Ophthalmology;  Laterality: Right;   CESAREAN SECTION  1987  and 1989   CHOLECYSTECTOMY  03/20/1989   COLONOSCOPY  03/21/2011   Normal.  [facility].   KNEE SURGERY  03/20/2004   LAPAROSCOPIC GASTRIC BANDING  05/18/2009   SPINE SURGERY     TONSILLECTOMY AND ADENOIDECTOMY  03/20/1972   TUBAL LIGATION     ULNAR TUNNEL RELEASE Right 02/05/2018   Procedure: CUBITAL TUNNEL RELEASE/DECOMPRESSION;  Surgeon: Marcene Corning, MD;  Location: [facility];  Service: Orthopedics;  Laterality: Right;   Family History  Problem Relation Age of Onset   Diabetes Mother    Heart disease Mother 73       CHF,  CAD   Other Mother        muscle disease   Hypertension Mother    Hyperlipidemia Mother    Arthritis Mother        OA hip L s/p THR   Leukemia Mother 22   Kidney disease Mother    Diabetes Father    Heart disease Father        AMI/CABG/valve replacement age 2.   Stroke Father    Diabetes Brother    Heart disease Brother        stent age 42.   Hypertension Brother    Obesity Brother    Stroke Brother    Cancer Paternal Aunt    Varicose Veins Paternal Aunt    Alzheimer's disease Maternal Grandmother    Emphysema Maternal Grandfather    Heart disease Paternal Grandmother    Fibromyalgia Daughter    Crohn's disease Daughter    Asthma Daughter    Obesity Daughter    Asthma Daughter    Hypertension Son    Breast cancer Other        2 Aunts   Social History   Socioeconomic History   Marital status: Married    Spouse name: Not on file   Number of children: 3   Years of education: college   Highest education level: Not on file  Occupational History   Occupation: Animal nutritionist: [employer]  Tobacco Use   Smoking status: Never   Smokeless tobacco: Never  Vaping Use   Vaping status: Never Used  Substance and Sexual Activity   Alcohol use: Yes    Comment: Socially   Drug use: No   Sexual activity: Not Currently    Birth control/protection: None    Comment: Hysterectomy  Other Topics Concern   Not on file  Social History Narrative    Married x 29 years, happily married; no abuse.    Children: 3 children; 1 grandchild; two step grandchildren; 1 gg.   Lives: with husband, mother-in-law.   Employment:  [employer] x 1996; Advice worker.  Loves work.   Tobacco: never   Alcohol: socially.  One glass of wine per month.    Seatbelt: 100%   Guns: gun safe in garage.   Social Determinants of Health   Financial Resource Strain: Low Risk  (10/03/2022)   Overall Financial Resource Strain (CARDIA)  Difficulty of Paying Living Expenses: Not very hard  Food  Insecurity: No Food Insecurity (10/03/2022)   Hunger Vital Sign    Worried About Running Out of Food in the Last Year: Never true    Ran Out of Food in the Last Year: Never true  Transportation Needs: No Transportation Needs (10/03/2022)   PRAPARE - Administrator, Civil Service (Medical): No    Lack of Transportation (Non-Medical): No  Physical Activity: Inactive (10/03/2022)   Exercise Vital Sign    Days of Exercise per Week: 0 days    Minutes of Exercise per Session: 0 min  Stress: No Stress Concern Present (10/03/2022)   [employer] of Occupational Health - Occupational Stress Questionnaire    Feeling of Stress : Only a little  Social Connections: Moderately Integrated (10/03/2022)   Social Connection and Isolation Panel [NHANES]    Frequency of Communication with Friends and Family: More than three times a week    Frequency of Social Gatherings with Friends and Family: Twice a week    Attends Religious Services: Never    Database administrator or Organizations: No    Attends Engineer, structural: 1 to 4 times per year    Marital Status: Married    Tobacco Counseling Counseling given: Yes   Clinical Intake:  Pre-visit preparation completed: Yes  Pain : No/denies pain     BMI - recorded: 49.44 Nutritional Status: BMI > 30  Obese Nutritional Risks: None Diabetes: Yes CBG done?: No (telehealth visit) Did pt. bring in CBG monitor from home?: No  How often do you need to have someone help you when you read instructions, pamphlets, or other written materials from your doctor or pharmacy?: 1 - Never  Interpreter Needed?: No  Information entered by :: Abby Clois Treanor,CMA   Activities of Daily Living    10/03/2022   11:38 AM 10/03/2022    7:35 AM  In your present state of health, do you have any difficulty performing the following activities:  Hearing? 0 0  Vision? 1 1  Difficulty concentrating or making decisions? 0 0  Walking or climbing  stairs? 1 1  Dressing or bathing? 1 1  Doing errands, shopping? 1 1  Preparing Food and eating ? Y N  Comment wheelchair bound so may need help doing certain things   Using the Toilet? Y Y  Comment needs help with hygiene. wheelchair bound   In the past six months, have you accidently leaked urine? Malvin Johns  Comment wheelchair bound   Do you have problems with loss of bowel control? Y Y  Managing your Medications? N N  Managing your Finances? N N  Housekeeping or managing your Housekeeping? Alpha Gula  Comment wheelchair bound     Patient Care Team: Doreene Nest, NP as PCP - General (Internal Medicine) Debbe Odea, MD as PCP - Cardiology (Cardiology) Janan Ridge, MD as Referring Physician (Psychiatry) Marylyn Ishihara, RD as Dietitian (Dietician) Pa, [facility] (Optometry) [place], Garry Heater, [place] (Inactive) as Pharmacist (Pharmacist) Maggie Font, FNP (Endocrinology)  Indicate any recent Medical Services you may have received from other than [facility] providers in the past year (date may be approximate).     Assessment:   This is a routine wellness examination for Lawson.  Hearing/Vision screen Hearing Screening - Comments:: Patient denies any hearing difficulties.    Dietary issues and exercise activities discussed:     Goals Addressed  This Visit's Progress     Patient Stated (pt-stated)        To eat healthier        Depression Screen    10/03/2022   11:35 AM 10/03/2022   11:22 AM 05/19/2022    3:14 PM 09/30/2021    3:05 PM 07/11/2021    2:24 PM 06/14/2021    8:21 AM 05/25/2020    8:00 AM  PHQ 2/9 Scores  PHQ - 2 Score 0 0 0 0 0 0 0  PHQ- 9 Score   6  0 0 0    Fall Risk    10/03/2022   11:40 AM 10/03/2022    7:35 AM 05/19/2022    3:14 PM 09/30/2021    3:04 PM 09/28/2021    7:57 PM  Fall Risk   Falls in the past year? 0 0 0 0 0  Number falls in past yr: 0 0 0 0 0  Injury with Fall? 0 0 0 0 0  Risk for fall due to : No Fall Risks   No Fall Risks Medication side effect;Impaired mobility;Impaired balance/gait   Follow up Falls prevention discussed  Falls evaluation completed Falls evaluation completed;Education provided;Falls prevention discussed     MEDICARE RISK AT HOME:  Medicare Risk at Home - 10/03/22 1144     Any stairs in or around the home? Yes    If so, are there any without handrails? Yes    Home free of loose throw rugs in walkways, pet beds, electrical cords, etc? Yes    Adequate lighting in your home to reduce risk of falls? Yes    Life alert? No    Use of a cane, walker or w/c? Yes    Grab bars in the bathroom? Yes    Shower chair or bench in shower? Yes    Elevated toilet seat or a handicapped toilet? Yes             TIMED UP AND GO:  Was the test performed?  No    Cognitive Function:        10/03/2022   11:20 AM 09/30/2021    3:06 PM  6CIT Screen  What Year? 0 points 0 points  What month? 0 points 0 points  What time? 0 points 0 points  Count back from 20 0 points 0 points  Months in reverse 0 points 0 points  Repeat phrase 0 points 0 points  Total Score 0 points 0 points    Immunizations Immunization History  Administered Date(s) Administered   Hepatitis B, ADULT 10/28/2012, 12/19/2012, 04/29/2013   Influenza Split 12/24/2010, 12/18/2011   Influenza,inj,Quad PF,6+ Mos 12/19/2012, 01/12/2014, 12/28/2014, 01/10/2016, 02/15/2017, 01/10/2018, 01/16/2019, 12/31/2019, 01/21/2021, 02/01/2022   Influenza-Unspecified 02/15/2017   Moderna Sars-Covid-2 Vaccination 05/12/2019, 06/10/2019, 11/25/2019   Pneumococcal Polysaccharide-23 08/22/2016   Pneumococcal-Unspecified 12/18/2005   Tdap 07/16/2009, 07/27/2020    TDAP status: Up to date  Flu Vaccine status: Up to date  Pneumococcal vaccine status: NOT AGE APPROPRIATE FOR THIS PATIENT   Covid-19 vaccine status: Information provided on how to obtain vaccines.   Qualifies for Shingles Vaccine? Yes   Zostavax completed No    Shingrix Completed?: No.    Education has been provided regarding the importance of this vaccine. Patient has been advised to call insurance company to determine out of pocket expense if they have not yet received this vaccine. Advised may also receive vaccine at local pharmacy or Health Dept. Verbalized acceptance and understanding.  Screening Tests Health  Maintenance  Topic Date Due   Zoster Vaccines- Shingrix (1 of 2) Never done   PAP SMEAR-Modifier  08/28/2014   COVID-19 Vaccine (4 - 2023-24 season) 11/18/2021   Diabetic kidney evaluation - Urine ACR  06/15/2022   INFLUENZA VACCINE  10/19/2022   HEMOGLOBIN A1C  11/06/2022   FOOT EXAM  06/09/2023   OPHTHALMOLOGY EXAM  06/09/2023   Diabetic kidney evaluation - eGFR measurement  06/14/2023   Medicare Annual Wellness (AWV)  10/03/2023   MAMMOGRAM  11/17/2023   DTaP/Tdap/Td (3 - Td or Tdap) 07/28/2030   Colonoscopy  09/18/2031   Hepatitis C Screening  Completed   HIV Screening  Completed   HPV VACCINES  Aged Out    Health Maintenance  Health Maintenance Due  Topic Date Due   Zoster Vaccines- Shingrix (1 of 2) Never done   PAP SMEAR-Modifier  08/28/2014   COVID-19 Vaccine (4 - 2023-24 season) 11/18/2021   Diabetic kidney evaluation - Urine ACR  06/15/2022    Colorectal cancer screening: Type of screening: Colonoscopy. Completed 09/17/2021. Repeat every 10 years  Mammogram status: Ordered 10/03/2022. Pt provided with contact info and advised to call to schedule appt.   Bone Density Screening: NOT AGE APPROPRIATE FOR THIS PATIENT   Lung Cancer Screening: (Low Dose CT Chest recommended if Age 45-80 years, 20 pack-year currently smoking OR have quit w/in 15years.) does not qualify.   Additional Screening:  Hepatitis C Screening: does not qualify; Completed 05/27/2014  Vision Screening: Recommended annual ophthalmology exams for early detection of glaucoma and other disorders of the eye. Is the patient up to date with their  annual eye exam?  Yes  Who is the provider or what is the name of the office in which the patient attends annual eye exams? [facility] If pt is not established with a provider, would they like to be referred to a provider to establish care? No .   Dental Screening: Recommended annual dental exams for proper oral hygiene  Diabetic Foot Exam: Diabetic Foot Exam: Completed 06/09/2022  Community Resource Referral / Chronic Care Management: CRR required this visit?  No   CCM required this visit?  No     Plan:     I have personally reviewed and noted the following in the patient's chart:   Medical and social history Use of alcohol, tobacco or illicit drugs  Current medications and supplements including opioid prescriptions. Patient is not currently taking opioid prescriptions. Functional ability and status Nutritional status Physical activity Advanced directives List of other physicians Hospitalizations, surgeries, and ER visits in previous 12 months Vitals Screenings to include cognitive, depression, and falls Referrals and appointments  In addition, I have reviewed and discussed with patient certain preventive protocols, quality metrics, and best practice recommendations. A written personalized care plan for preventive services as well as general preventive health recommendations were provided to patient.     Jordan Hawks Cherina Dhillon, CMA   10/03/2022   After Visit Summary: (MyChart) Due to this being a telephonic visit, the after visit summary with patients personalized plan was offered to patient via MyChart   Nurse Notes: Mammogram ordered today

## 2022-10-11 DIAGNOSIS — Z794 Long term (current) use of insulin: Secondary | ICD-10-CM | POA: Diagnosis not present

## 2022-10-11 DIAGNOSIS — Z978 Presence of other specified devices: Secondary | ICD-10-CM | POA: Diagnosis not present

## 2022-10-11 DIAGNOSIS — Z7985 Long-term (current) use of injectable non-insulin antidiabetic drugs: Secondary | ICD-10-CM | POA: Diagnosis not present

## 2022-10-11 DIAGNOSIS — E1165 Type 2 diabetes mellitus with hyperglycemia: Secondary | ICD-10-CM | POA: Diagnosis not present

## 2022-10-11 LAB — PROTEIN / CREATININE RATIO, URINE
Albumin, U: 11
Creatinine, Urine: 91

## 2022-10-11 LAB — HEMOGLOBIN A1C: Hemoglobin A1C: 6.8

## 2022-10-11 LAB — MICROALBUMIN / CREATININE URINE RATIO: Microalb Creat Ratio: 12

## 2022-10-12 DIAGNOSIS — G4733 Obstructive sleep apnea (adult) (pediatric): Secondary | ICD-10-CM

## 2022-10-12 NOTE — Telephone Encounter (Signed)
Please print download

## 2022-10-16 ENCOUNTER — Other Ambulatory Visit: Payer: Self-pay | Admitting: Primary Care

## 2022-10-16 DIAGNOSIS — F419 Anxiety disorder, unspecified: Secondary | ICD-10-CM

## 2022-10-19 ENCOUNTER — Other Ambulatory Visit: Payer: Self-pay | Admitting: Primary Care

## 2022-10-19 DIAGNOSIS — E7800 Pure hypercholesterolemia, unspecified: Secondary | ICD-10-CM

## 2022-10-19 NOTE — Telephone Encounter (Signed)
BiPAP download looks excellent very good control of your sleep apnea we can cut down on pressure support for more comfort. Recommend decreased auto BiPAP IPAP max at 18 cm H2O and EPAP minimum at 8 cm H2O. Download in 4 weeks.  Please make sure that she has a follow-up visit in the next few months.

## 2022-10-25 DIAGNOSIS — G4733 Obstructive sleep apnea (adult) (pediatric): Secondary | ICD-10-CM | POA: Diagnosis not present

## 2022-11-02 DIAGNOSIS — G4733 Obstructive sleep apnea (adult) (pediatric): Secondary | ICD-10-CM | POA: Diagnosis not present

## 2022-11-06 DIAGNOSIS — R238 Other skin changes: Secondary | ICD-10-CM

## 2022-11-07 MED ORDER — KETOCONAZOLE 2 % EX CREA: 1.0000 | TOPICAL_CREAM | Freq: Every day | CUTANEOUS | 0 refills | Status: AC | PRN

## 2022-11-25 DIAGNOSIS — G4733 Obstructive sleep apnea (adult) (pediatric): Secondary | ICD-10-CM | POA: Diagnosis not present

## 2022-12-01 ENCOUNTER — Telehealth: Payer: Self-pay | Admitting: Primary Care

## 2022-12-01 NOTE — Telephone Encounter (Signed)
Patient dropped off document DMV, to be filled out by provider. Patient requested to send it back via Call Patient to pick up within 5-days. Document is located in providers tray at front office.Please advise at Mobile [phone] (mobile)

## 2022-12-04 DIAGNOSIS — G4733 Obstructive sleep apnea (adult) (pediatric): Secondary | ICD-10-CM | POA: Diagnosis not present

## 2022-12-04 NOTE — Telephone Encounter (Signed)
Completed form and placed in Kelli's inbox.

## 2022-12-04 NOTE — Telephone Encounter (Signed)
Placed in [place] inbox for review and completion

## 2022-12-05 NOTE — Telephone Encounter (Signed)
Called and advised patient form is ready and placed up front with reception

## 2022-12-09 ENCOUNTER — Other Ambulatory Visit: Payer: Self-pay | Admitting: Primary Care

## 2022-12-09 DIAGNOSIS — I10 Essential (primary) hypertension: Secondary | ICD-10-CM

## 2022-12-11 ENCOUNTER — Ambulatory Visit
Admission: RE | Admit: 2022-12-11 | Discharge: 2022-12-11 | Disposition: A | Discharge status: Home / Self Care | Payer: Medicare PPO | Source: Ambulatory Visit | Attending: Primary Care

## 2022-12-11 DIAGNOSIS — Z1231 Encounter for screening mammogram for malignant neoplasm of breast: Secondary | ICD-10-CM

## 2022-12-17 DIAGNOSIS — E1165 Type 2 diabetes mellitus with hyperglycemia: Secondary | ICD-10-CM | POA: Diagnosis not present

## 2022-12-25 ENCOUNTER — Other Ambulatory Visit: Payer: Self-pay | Admitting: Primary Care

## 2022-12-25 DIAGNOSIS — G4733 Obstructive sleep apnea (adult) (pediatric): Secondary | ICD-10-CM | POA: Diagnosis not present

## 2022-12-25 DIAGNOSIS — I10 Essential (primary) hypertension: Secondary | ICD-10-CM

## 2023-01-11 ENCOUNTER — Other Ambulatory Visit: Payer: Self-pay | Admitting: Primary Care

## 2023-01-11 DIAGNOSIS — F419 Anxiety disorder, unspecified: Secondary | ICD-10-CM

## 2023-01-23 ENCOUNTER — Ambulatory Visit: Payer: Medicare PPO | Admitting: Primary Care

## 2023-01-23 ENCOUNTER — Encounter: Payer: Self-pay | Admitting: Primary Care

## 2023-01-23 VITALS — BP 130/78 | HR 76 | Temp 98.6°F | Ht 66.5 in | Wt 311.0 lb

## 2023-01-23 DIAGNOSIS — E1140 Type 2 diabetes mellitus with diabetic neuropathy, unspecified: Secondary | ICD-10-CM

## 2023-01-23 DIAGNOSIS — F419 Anxiety disorder, unspecified: Secondary | ICD-10-CM | POA: Diagnosis not present

## 2023-01-23 DIAGNOSIS — R29898 Other symptoms and signs involving the musculoskeletal system: Secondary | ICD-10-CM

## 2023-01-23 DIAGNOSIS — E782 Mixed hyperlipidemia: Secondary | ICD-10-CM | POA: Diagnosis not present

## 2023-01-23 DIAGNOSIS — Z0000 Encounter for general adult medical examination without abnormal findings: Secondary | ICD-10-CM | POA: Diagnosis not present

## 2023-01-23 DIAGNOSIS — G4733 Obstructive sleep apnea (adult) (pediatric): Secondary | ICD-10-CM | POA: Diagnosis not present

## 2023-01-23 DIAGNOSIS — I10 Essential (primary) hypertension: Secondary | ICD-10-CM

## 2023-01-23 DIAGNOSIS — J4520 Mild intermittent asthma, uncomplicated: Secondary | ICD-10-CM | POA: Diagnosis not present

## 2023-01-23 DIAGNOSIS — G894 Chronic pain syndrome: Secondary | ICD-10-CM

## 2023-01-23 DIAGNOSIS — J309 Allergic rhinitis, unspecified: Secondary | ICD-10-CM | POA: Diagnosis not present

## 2023-01-23 DIAGNOSIS — Z794 Long term (current) use of insulin: Secondary | ICD-10-CM | POA: Diagnosis not present

## 2023-01-23 LAB — COMPREHENSIVE METABOLIC PANEL
ALT: 20 U/L (ref 0–35)
AST: 19 U/L (ref 0–37)
Albumin: 3.5 g/dL (ref 3.5–5.2)
Alkaline Phosphatase: 46 U/L (ref 39–117)
BUN: 29 mg/dL — ABNORMAL HIGH (ref 6–23)
CO2: 32 meq/L (ref 19–32)
Calcium: 9.2 mg/dL (ref 8.4–10.5)
Chloride: 104 meq/L (ref 96–112)
Creatinine, Ser: 1.05 mg/dL (ref 0.40–1.20)
GFR: 58.53 mL/min — ABNORMAL LOW (ref >60.00)
Glucose, Bld: 81 mg/dL (ref 70–99)
Potassium: 4.3 meq/L (ref 3.5–5.1)
Sodium: 144 meq/L (ref 135–145)
Total Bilirubin: 0.4 mg/dL (ref 0.2–1.2)
Total Protein: 6.3 g/dL (ref 6.0–8.3)

## 2023-01-23 LAB — LIPID PANEL
Cholesterol: 165 mg/dL (ref 0–200)
HDL: 59 mg/dL (ref >39.00)
LDL Cholesterol: 78 mg/dL (ref 0–99)
NonHDL: 105.6
Total CHOL/HDL Ratio: 3
Triglycerides: 138 mg/dL (ref 0.0–149.0)
VLDL: 27.6 mg/dL (ref 0.0–40.0)

## 2023-01-23 NOTE — Assessment & Plan Note (Signed)
Controlled.  ?Continue fluoxetine 40 mg daily. ?

## 2023-01-23 NOTE — Assessment & Plan Note (Signed)
Improved but still chronic.  Reviewed neurology notes from May 2024 through care everywhere.

## 2023-01-23 NOTE — Progress Notes (Signed)
Subjective:    Patient ID: [name], female    DOB: [birth year], 57 y.o.   MRN: [id number]  HPI  [name] is a very pleasant 58 y.o. female who presents today for complete physical and follow up of chronic conditions.  Immunizations: -Tetanus: Completed in 2022 -Influenza: Completed this season  -Shingles: Never completed  -Pneumonia: Completed last in 2018  Diet: Fair diet.  Exercise: No regular exercise.  Eye exam: Completes annually  Dental exam: Completed 1 year ago   Pap Smear: Hysterectomy  Mammogram: Completed in September 2024   Colonoscopy: Completed in 2022, due 2027  BP Readings from Last 3 Encounters:  01/23/23 130/78  08/24/22 134/63  08/10/22 102/70       Review of Systems  Constitutional:  Negative for unexpected weight change.  HENT:  Negative for rhinorrhea.   Respiratory:  Negative for cough and shortness of breath.   Cardiovascular:  Negative for chest pain.  Gastrointestinal:  Negative for constipation and diarrhea.  Genitourinary:  Negative for difficulty urinating.  Musculoskeletal:  Negative for arthralgias and myalgias.  Skin:  Negative for rash.  Allergic/Immunologic: Positive for environmental allergies.  Neurological:  Positive for weakness. Negative for dizziness and headaches.  Psychiatric/Behavioral:  The patient is not nervous/anxious.          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   Anemia    Anxiety    Arthritis    Asthma    Bacteremia 05/09/2022   Cataract    CHF (congestive heart failure) (HCC)    Colon polyp    Complication of anesthesia    Cystitis with hematuria 10/24/2021   Diabetes mellitus    Displaced fracture of proximal end of right fibula 07/21/2017   Edema    Fatigue    Hypertension    IBS (irritable bowel syndrome)    Lump in female breast    Methicillin resistant  Staphylococcus aureus in conditions classified elsewhere and of unspecified site    Migraines    Morbid obesity (HCC)    Neuromuscular disorder (HCC)    Night sweats    Nonspecific abnormal results of thyroid function study    Other B-complex deficiencies    Paresthesias    PONV (postoperative nausea and vomiting)    Pure hypercholesterolemia    Reflux    Sacral fracture (HCC)    Septic shock (HCC) 05/08/2022   Sinus problem    Sleep apnea    Sore on toe (HCC) 05/17/2021   Symptomatic states associated with artificial menopause    Thyroid disease    Type II or unspecified type diabetes mellitus without mention of complication, not stated as uncontrolled    Unspecified sleep apnea     Social History   Socioeconomic History   Marital status: Married    Spouse name: Not on file   Number of children: 3   Years of education: college   Highest education level: Associate degree: occupational, Scientist, product/process development, or vocational program  Occupational History   Occupation: Animal nutritionist: [employer]  Tobacco Use   Smoking status: Never   Smokeless tobacco: Never  Vaping Use   Vaping status: Never Used  Substance and Sexual Activity   Alcohol use: Yes    Comment: Socially   Drug use: No   Sexual activity: Not Currently    Birth control/protection: None  Comment: Hysterectomy  Other Topics Concern   Not on file  Social History Narrative    Married x 29 years, happily married; no abuse.    Children: 3 children; 1 grandchild; two step grandchildren; 1 gg.   Lives: with husband, mother-in-law.   Employment:  [employer] x 1996; Advice worker.  Loves work.   Tobacco: never   Alcohol: socially.  One glass of wine per month.    Seatbelt: 100%   Guns: gun safe in garage.   Social Determinants of Health   Financial Resource Strain: Low Risk  (01/21/2023)   Overall Financial Resource Strain (CARDIA)    Difficulty of Paying Living Expenses: Not very hard  Food Insecurity: No Food  Insecurity (01/21/2023)   Hunger Vital Sign    Worried About Running Out of Food in the Last Year: Never true    Ran Out of Food in the Last Year: Never true  Transportation Needs: No Transportation Needs (01/21/2023)   PRAPARE - Administrator, Civil Service (Medical): No    Lack of Transportation (Non-Medical): No  Physical Activity: Inactive (01/21/2023)   Exercise Vital Sign    Days of Exercise per Week: 0 days    Minutes of Exercise per Session: 0 min  Stress: No Stress Concern Present (01/21/2023)   [employer] of Occupational Health - Occupational Stress Questionnaire    Feeling of Stress : Not at all  Social Connections: Socially Integrated (01/21/2023)   Social Connection and Isolation Panel [NHANES]    Frequency of Communication with Friends and Family: More than three times a week    Frequency of Social Gatherings with Friends and Family: Once a week    Attends Religious Services: More than 4 times per year    Active Member of Clubs or Organizations: No    Attends Banker Meetings: 1 to 4 times per year    Marital Status: Married  Catering manager Violence: Not At Risk (10/03/2022)   Humiliation, Afraid, Rape, and Kick questionnaire    Fear of Current or Ex-Partner: No    Emotionally Abused: No    Physically Abused: No    Sexually Abused: No    Past Surgical History:  Procedure Laterality Date   abcess removal  03/21/1995   MRSA   ABDOMINAL HYSTERECTOMY  03/21/1995   complete in 1997, partial was in 1995   carpal tunnel release r  07/03/2013   [facility]orf   CATARACT EXTRACTION W/PHACO Left 08/10/2022   Procedure: CATARACT EXTRACTION PHACO AND INTRAOCULAR LENS PLACEMENT (IOC) LEFT DIABETIC  ASPIRE ENVISTA  LENS  4.39  00:36.5;  Surgeon: Estanislado Pandy, MD;  Location: [facility];  Service: Ophthalmology;  Laterality: Left;   CATARACT EXTRACTION W/PHACO Right 08/24/2022   Procedure: CATARACT EXTRACTION PHACO AND INTRAOCULAR LENS  PLACEMENT (IOC) RIGHT DIABETIC  ASPIRE ENVISTA LENS  5.09  00:31.3;  Surgeon: Estanislado Pandy, MD;  Location: [facility];  Service: Ophthalmology;  Laterality: Right;   CESAREAN SECTION  1987  and 1989   CHOLECYSTECTOMY  03/20/1989   COLONOSCOPY  03/21/2011   Normal.  [facility].   KNEE SURGERY  03/20/2004   LAPAROSCOPIC GASTRIC BANDING  05/18/2009   SPINE SURGERY     TONSILLECTOMY AND ADENOIDECTOMY  03/20/1972   TUBAL LIGATION     ULNAR TUNNEL RELEASE Right 02/05/2018   Procedure: CUBITAL TUNNEL RELEASE/DECOMPRESSION;  Surgeon: Marcene Corning, MD;  Location: [facility];  Service: Orthopedics;  Laterality: Right;    Family History  Problem Relation Age of Onset   Diabetes Mother    Heart disease Mother 51       CHF, CAD   Other Mother        muscle disease   Hypertension Mother    Hyperlipidemia Mother    Arthritis Mother        OA hip L s/p THR   Leukemia Mother 25   Kidney disease Mother    Diabetes Father    Heart disease Father        AMI/CABG/valve replacement age 62.   Stroke Father    Diabetes Brother    Heart disease Brother        stent age 74.   Hypertension Brother    Obesity Brother    Stroke Brother    Cancer Paternal Aunt    Varicose Veins Paternal Aunt    Alzheimer's disease Maternal Grandmother    Emphysema Maternal Grandfather    Heart disease Paternal Grandmother    Fibromyalgia Daughter    Crohn's disease Daughter    Asthma Daughter    Obesity Daughter    Asthma Daughter    Hypertension Son    Breast cancer Other        2 Aunts    Allergies  Allergen Reactions   Amoxicillin Rash, Shortness Of Breath and Other (See Comments)    Patient states she was previously able to tolerate Keflex without issue   Bee Venom Anaphylaxis   Levofloxacin Rash, Shortness Of Breath and Other (See Comments)   Meloxicam Hives, Rash and Other (See Comments)   Vancomycin Anaphylaxis and Other (See Comments)   Ciprofloxacin Hives, Itching, Swelling  and Other (See Comments)   Sulfa Antibiotics Rash and Other (See Comments)   Ace Inhibitors Cough and Other (See Comments)   Codeine Other (See Comments)   Meperidine Hcl Other (See Comments)   Oxycodone Other (See Comments)   Oxycodone-Acetaminophen    Penicillin G Other (See Comments)   Tyloxapol     Other reaction(s): vomiting/rash   Penicillins Swelling and Rash    Has patient had a PCN reaction causing immediate rash, facial/tongue/throat swelling, SOB or lightheadedness with hypotension: Yes Has patient had a PCN reaction causing severe rash involving mucus membranes or skin necrosis: Yes Has patient had a PCN reaction that required hospitalization: No Has patient had a PCN reaction occurring within the last 10 years: No If all of the above answers are "NO", then may proceed with Cephalosporin use.    Tequin [Gatifloxacin] Rash    Current Outpatient Medications on File Prior to Visit  Medication Sig Dispense Refill   acetaminophen (TYLENOL) 325 MG tablet Take 650 mg by mouth every 6 (six) hours as needed.     albuterol (PROVENTIL) (2.5 MG/3ML) 0.083% nebulizer solution Take 3 mLs (2.5 mg total) by nebulization every 6 (six) hours as needed for wheezing or shortness of breath. 75 mL 2   albuterol (VENTOLIN HFA) 108 (90 Base) MCG/ACT inhaler Inhale 1-2 puffs into the lungs every 6 (six) hours as needed for wheezing or shortness of breath. 18 g 1   atorvastatin (LIPITOR) 40 MG tablet TAKE ONE TABLET BY MOUTH ONCE A DAY FOR CHOLESTEROL. 90 tablet 0   Bacillus Coagulans-Inulin (PROBIOTIC-PREBIOTIC PO) Take by mouth.     budesonide (ENTOCORT EC) 3 MG 24 hr capsule Take 9 mg by mouth daily.     Continuous Blood Gluc Receiver (DEXCOM G7 RECEIVER) DEVI by Does not apply route.     Continuous  Blood Gluc Sensor (DEXCOM G7 SENSOR) MISC by Does not apply route.     cyclobenzaprine (FLEXERIL) 10 MG tablet Take 1 tablet (10 mg total) by mouth 3 (three) times daily as needed for muscle spasms.  270 tablet 2   Dulaglutide 4.5 MG/0.5ML SOPN Inject into the skin.     EPINEPHrine (EPIPEN 2-PAK) 0.3 mg/0.3 mL IJ SOAJ injection Inject 0.3 mg into the muscle as needed for anaphylaxis. 1 each 1   ergocalciferol (VITAMIN D2) 1.25 MG (50000 UT) capsule Take by mouth.     fexofenadine (ALLEGRA) 180 MG tablet 1 tablet     FLUoxetine (PROZAC) 40 MG capsule TAKE ONE CAPSULE BY MOUTH ONCE DAILY FOR ANXIETY AND DEPRESSION 90 capsule 0   fluticasone (FLONASE) 50 MCG/ACT nasal spray Place 1 spray into both nostrils 2 (two) times daily. 48 mL 1   gabapentin (NEURONTIN) 600 MG tablet Take 1 tablet (600 mg total) by mouth 2 (two) times daily. For pain 180 tablet 2   hydrochlorothiazide (HYDRODIURIL) 12.5 MG tablet TAKE ONE TABLET BY MOUTH ONCE DAILY FOR BLOOD PRESSURE 90 tablet 0   insulin aspart (NOVOLOG FLEXPEN) 100 UNIT/ML FlexPen Inject 22-26 Units into the skin 3 (three) times daily with meals. 30 mL 5   insulin degludec (TRESIBA) 100 UNIT/ML FlexTouch Pen Inject 40 Units into the skin 2 (two) times daily.     Insulin Pen Needle 32G X 4 MM MISC Use 1 needle with pen as directed 100 each 11   ketoconazole (NIZORAL) 2 % cream Apply 1 Application topically daily as needed for irritation. 30 g 0   levocetirizine (XYZAL) 5 MG tablet TAKE ONE TABLET BY MOUTH EVERY EVENING 90 tablet 1   losartan (COZAAR) 50 MG tablet TAKE ONE TABLET BY MOUTH ONCE DAILY FOR BLOOD PRESSURE 90 tablet 0   metoprolol tartrate (LOPRESSOR) 25 MG tablet Take 0.5 tablets (12.5 mg total) by mouth 2 (two) times daily. for blood pressure. 90 tablet 2   Multiple Vitamin (MULTIVITAMIN PO) Take 1 tablet by mouth daily.      Nebulizer MISC Dispense 1 nebulizer for home use, with tubing and supplies. J45.909. 1 each 0   ondansetron (ZOFRAN-ODT) 4 MG disintegrating tablet TAKE 1 TABLET BY MOUTH EVERY 8 HOURS AS NEEDED FOR NAUSEA AS DIRECTED. 20 tablet 0   VALERIAN ROOT PO Take 1,000 mg by mouth at bedtime.     Current Facility-Administered  Medications on File Prior to Visit  Medication Dose Route Frequency Provider Last Rate Last Admin   silver sulfADIAZINE (SILVADENE) 1 % cream 1 Application  1 Application Topical Once Cable, Genene Churn, NP        BP 130/78   Pulse 76   Temp 98.6 F (37 C) (Oral)   Ht 5' 6.5" (1.689 m)   Wt (!) 311 lb (141.1 kg) Comment: per pt at last check 3 months ago  SpO2 98%   BMI 49.44 kg/m  Objective:   Physical Exam HENT:     Right Ear: Tympanic membrane and ear canal normal.     Left Ear: Tympanic membrane and ear canal normal.  Eyes:     Pupils: Pupils are equal, round, and reactive to light.  Cardiovascular:     Rate and Rhythm: Normal rate and regular rhythm.  Pulmonary:     Effort: Pulmonary effort is normal.     Breath sounds: Normal breath sounds.  Abdominal:     General: Bowel sounds are normal.     Palpations: Abdomen is  soft.     Tenderness: There is no abdominal tenderness.  Musculoskeletal:        General: Normal range of motion.     Cervical back: Neck supple.     Comments: Wheelchair-bound.  Improved range of motion and strength to bilateral lower extremities compared to last visit.  Skin:    General: Skin is warm and dry.  Neurological:     Mental Status: She is alert and oriented to person, place, and time.     Cranial Nerves: No cranial nerve deficit.     Deep Tendon Reflexes:     Reflex Scores:      Patellar reflexes are 2+ on the right side and 2+ on the left side. Psychiatric:        Mood and Affect: Mood normal.           Assessment & Plan:  Routine general medical examination at a health care facility Assessment & Plan: Discussed Shingrix vaccines for which she will need to obtain at the pharmacy. Mammogram up-to-date. Colonoscopy UTD, due 2027  Discussed the importance of a healthy diet and regular exercise in order for weight loss, and to reduce the risk of further co-morbidity.  Exam stable. Labs pending.  Follow up in 1 year for repeat  physical.    Type 2 diabetes mellitus with diabetic neuropathy, with long-term current use of insulin [facility]) Assessment & Plan: Following with endocrinology, office notes and labs reviewed from July 2024.  Continue Trulicity 4.5 mg weekly, Tresiba 40 units daily, NovoLog sliding scale 22 to 28 units with meals.  Orders: -     Microalbumin / creatinine urine ratio  OSA (obstructive sleep apnea) Assessment & Plan: Following with pulmonology, office notes reviewed from May 2024. Continue BiPAP nightly.   Essential hypertension Assessment & Plan: Controlled.  Continue losartan 50 mg daily, hydrochlorothiazide 12.5 mg daily, metoprolol tartrate 12.5 mg BID.   CMP pending.   Allergic rhinitis, unspecified seasonality, unspecified trigger Assessment & Plan: Stable.  Continue Xyzal 5 mg every evening. Continue Flonase as needed.   Mild intermittent asthma without complication Assessment & Plan: Controlled.  Continue albuterol inhaler as needed. Continue albuterol nebulizer treatments as needed.   Anxiety Assessment & Plan: Controlled.  Continue fluoxetine 40 mg daily.   Chronic pain syndrome Assessment & Plan: Controlled.  Continue gabapentin 600 mg twice daily.   Mixed hyperlipidemia Assessment & Plan: Repeat lipid panel pending.  Continue atorvastatin 40 mg daily.  Orders: -     Lipid panel -     Comprehensive metabolic panel  Weakness of both lower extremities Assessment & Plan: Improved but still chronic.  Reviewed neurology notes from May 2024 through care everywhere.          Doreene Nest, NP

## 2023-01-23 NOTE — Assessment & Plan Note (Signed)
Controlled.  Continue albuterol inhaler as needed. Continue albuterol nebulizer treatments as needed.

## 2023-01-23 NOTE — Assessment & Plan Note (Signed)
Controlled.  Continue gabapentin 600 mg twice daily.

## 2023-01-23 NOTE — Assessment & Plan Note (Signed)
Following with endocrinology, office notes and labs reviewed from July 2024.  Continue Trulicity 4.5 mg weekly, Tresiba 40 units daily, NovoLog sliding scale 22 to 28 units with meals.

## 2023-01-23 NOTE — Patient Instructions (Signed)
Stop by the lab prior to leaving today. I will notify you of your results once received.   Complete the shingles vaccines.  It was a pleasure to see you today!

## 2023-01-23 NOTE — Assessment & Plan Note (Addendum)
Controlled.  Continue losartan 50 mg daily, hydrochlorothiazide 12.5 mg daily, metoprolol tartrate 12.5 mg BID.   CMP pending.

## 2023-01-23 NOTE — Assessment & Plan Note (Signed)
Stable.  Continue Xyzal 5 mg every evening. Continue Flonase as needed.

## 2023-01-23 NOTE — Assessment & Plan Note (Signed)
Discussed Shingrix vaccines for which she will need to obtain at the pharmacy. Mammogram up-to-date. Colonoscopy UTD, due 2027  Discussed the importance of a healthy diet and regular exercise in order for weight loss, and to reduce the risk of further co-morbidity.  Exam stable. Labs pending.  Follow up in 1 year for repeat physical.

## 2023-01-23 NOTE — Addendum Note (Signed)
Addended by: Lovena Neighbours on: 01/23/2023 08:52 AM   Modules accepted: Orders

## 2023-01-23 NOTE — Assessment & Plan Note (Signed)
Repeat lipid panel pending. Continue atorvastatin 40 mg daily.

## 2023-01-23 NOTE — Assessment & Plan Note (Signed)
Following with pulmonology, office notes reviewed from May 2024. Continue BiPAP nightly.

## 2023-01-24 ENCOUNTER — Other Ambulatory Visit (INDEPENDENT_AMBULATORY_CARE_PROVIDER_SITE_OTHER): Payer: Medicare PPO

## 2023-01-24 DIAGNOSIS — E1140 Type 2 diabetes mellitus with diabetic neuropathy, unspecified: Secondary | ICD-10-CM | POA: Diagnosis not present

## 2023-01-24 DIAGNOSIS — Z794 Long term (current) use of insulin: Secondary | ICD-10-CM

## 2023-01-24 LAB — MICROALBUMIN / CREATININE URINE RATIO
Creatinine,U: 120.5 mg/dL
Microalb Creat Ratio: 0.6 mg/g (ref 0.0–30.0)
Microalb, Ur: <0.7 mg/dL (ref 0.0–1.9)

## 2023-01-25 DIAGNOSIS — G4733 Obstructive sleep apnea (adult) (pediatric): Secondary | ICD-10-CM | POA: Diagnosis not present

## 2023-01-26 ENCOUNTER — Other Ambulatory Visit: Payer: Self-pay | Admitting: Primary Care

## 2023-01-26 DIAGNOSIS — R110 Nausea: Secondary | ICD-10-CM

## 2023-01-26 MED ORDER — ONDANSETRON 4 MG PO TBDP
ORAL_TABLET | ORAL | 0 refills | Status: AC
Start: 2023-01-26 — End: ?

## 2023-02-02 ENCOUNTER — Telehealth: Payer: Medicare PPO | Admitting: Adult Health

## 2023-02-02 ENCOUNTER — Encounter: Payer: Self-pay | Admitting: Adult Health

## 2023-02-02 DIAGNOSIS — G4733 Obstructive sleep apnea (adult) (pediatric): Secondary | ICD-10-CM | POA: Diagnosis not present

## 2023-02-02 NOTE — Progress Notes (Signed)
Virtual Visit via Video Note  I connected with [name] on 02/02/23 at  1:30 PM EST by a video enabled telemedicine application and verified that I am speaking with the correct person using two identifiers.  Location: Patient: Home  Provider: Office    I discussed the limitations of evaluation and management by telemedicine and the availability of in person appointments. The patient expressed understanding and agreed to proceed.  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 She underwent lap band surgery and has significant weight loss greater than 125 pounds.   Today's video visit today in for follow-up of sleep apnea.  Last visit patient was started on BiPAP support.  Patient says she is doing very well since last visit.  She has gotten started on her BiPAP it took her a little while to get used to it she recently changed over to a fullface mask and this seems to be working very well for her.  She is able to sleep better feels that she is benefiting from BiPAP with decreased daytime sleepiness. BiPAP download shows 83% compliance.  Daily average use at 7.5 hours.  Patient is on auto BiPAP IPAP max 18 EPAP minimum 8 and pressure support of 6.  AHI 1.5/hour.  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   Anemia    Anxiety    Arthritis    Asthma    Bacteremia 05/09/2022   Cataract    CHF (congestive heart failure) (HCC)    Colon polyp    Complication of anesthesia    Cystitis  with hematuria 10/24/2021   Diabetes mellitus    Displaced fracture of proximal end of right fibula 07/21/2017   Edema    Fatigue    Hypertension    IBS (irritable bowel syndrome)    Lump in female breast    Methicillin resistant Staphylococcus aureus in conditions classified elsewhere and of unspecified site    Migraines    Morbid obesity (HCC)    Neuromuscular disorder (HCC)    Night sweats    Nonspecific abnormal results of thyroid function study    Other B-complex deficiencies    Paresthesias    PONV (postoperative nausea and vomiting)    Pure hypercholesterolemia    Reflux    Sacral fracture (HCC)    Septic shock (HCC) 05/08/2022   Sinus problem    Sleep apnea    Sore on toe (HCC) 05/17/2021   Symptomatic states associated with artificial menopause    Thyroid disease    Type II or unspecified type diabetes mellitus without mention of complication, not stated as uncontrolled    Unspecified sleep apnea     Current Outpatient Medications on File Prior to Visit  Medication Sig Dispense Refill   acetaminophen (TYLENOL) 325 MG tablet Take 650 mg by mouth every 6 (six) hours as needed.     albuterol (PROVENTIL) (2.5 MG/3ML) 0.083% nebulizer solution Take 3 mLs (2.5 mg total) by nebulization every 6 (six) hours as needed for wheezing or shortness of breath. 75 mL  2   albuterol (VENTOLIN HFA) 108 (90 Base) MCG/ACT inhaler Inhale 1-2 puffs into the lungs every 6 (six) hours as needed for wheezing or shortness of breath. 18 g 1   atorvastatin (LIPITOR) 40 MG tablet TAKE ONE TABLET BY MOUTH ONCE A DAY FOR CHOLESTEROL. 90 tablet 0   Bacillus Coagulans-Inulin (PROBIOTIC-PREBIOTIC PO) Take by mouth.     budesonide (ENTOCORT EC) 3 MG 24 hr capsule Take 9 mg by mouth daily.     Continuous Blood Gluc Receiver (DEXCOM G7 RECEIVER) DEVI by Does not apply route.     Continuous Blood Gluc Sensor (DEXCOM G7 SENSOR) MISC by Does not apply route.     cyclobenzaprine (FLEXERIL) 10 MG tablet Take 1  tablet (10 mg total) by mouth 3 (three) times daily as needed for muscle spasms. 270 tablet 2   Dulaglutide 4.5 MG/0.5ML SOPN Inject into the skin.     EPINEPHrine (EPIPEN 2-PAK) 0.3 mg/0.3 mL IJ SOAJ injection Inject 0.3 mg into the muscle as needed for anaphylaxis. 1 each 1   ergocalciferol (VITAMIN D2) 1.25 MG (50000 UT) capsule Take by mouth.     fexofenadine (ALLEGRA) 180 MG tablet 1 tablet     FLUoxetine (PROZAC) 40 MG capsule TAKE ONE CAPSULE BY MOUTH ONCE DAILY FOR ANXIETY AND DEPRESSION 90 capsule 0   fluticasone (FLONASE) 50 MCG/ACT nasal spray Place 1 spray into both nostrils 2 (two) times daily. 48 mL 1   gabapentin (NEURONTIN) 600 MG tablet Take 1 tablet (600 mg total) by mouth 2 (two) times daily. For pain 180 tablet 2   hydrochlorothiazide (HYDRODIURIL) 12.5 MG tablet TAKE ONE TABLET BY MOUTH ONCE DAILY FOR BLOOD PRESSURE 90 tablet 0   insulin aspart (NOVOLOG FLEXPEN) 100 UNIT/ML FlexPen Inject 22-26 Units into the skin 3 (three) times daily with meals. 30 mL 5   insulin degludec (TRESIBA) 100 UNIT/ML FlexTouch Pen Inject 40 Units into the skin 2 (two) times daily.     Insulin Pen Needle 32G X 4 MM MISC Use 1 needle with pen as directed 100 each 11   ketoconazole (NIZORAL) 2 % cream Apply 1 Application topically daily as needed for irritation. 30 g 0   levocetirizine (XYZAL) 5 MG tablet TAKE ONE TABLET BY MOUTH EVERY EVENING 90 tablet 1   losartan (COZAAR) 50 MG tablet TAKE ONE TABLET BY MOUTH ONCE DAILY FOR BLOOD PRESSURE 90 tablet 0   metoprolol tartrate (LOPRESSOR) 25 MG tablet Take 0.5 tablets (12.5 mg total) by mouth 2 (two) times daily. for blood pressure. 90 tablet 2   Multiple Vitamin (MULTIVITAMIN PO) Take 1 tablet by mouth daily.      Nebulizer MISC Dispense 1 nebulizer for home use, with tubing and supplies. J45.909. 1 each 0   ondansetron (ZOFRAN-ODT) 4 MG disintegrating tablet TAKE 1 TABLET BY MOUTH EVERY 8 HOURS AS NEEDED FOR NAUSEA AS DIRECTED. 20 tablet 0   VALERIAN  ROOT PO Take 1,000 mg by mouth at bedtime.     Current Facility-Administered Medications on File Prior to Visit  Medication Dose Route Frequency Provider Last Rate Last Admin   silver sulfADIAZINE (SILVADENE) 1 % cream 1 Application  1 Application Topical Once Eden Emms, NP            Observations/Objective: in lab split-night sleep study. This showed severe obstructive sleep apnea with AHI at 72.2 with a SpO2 low at 77%. Titration portion showed optimal control on BiPAP 20/14 cm H2O.   Appears well in no acute  distress  Assessment and Plan: Severe obstructive sleep apnea with excellent control compliance on nocturnal BIPAP   Morbid Obesity -healthy weight loss   Plan  Patient Instructions  Continue on BiPAP at night with sleep and naps, goal is to wear all night long Work on healthy weight loss .  Do not drive if sleepy  Use caution with sedating medications Follow up in 6 months with Dr. Wynona Neat or Arye Weyenberg NP and As needed       Follow Up Instructions:    I discussed the assessment and treatment plan with the patient. The patient was provided an opportunity to ask questions and all were answered. The patient agreed with the plan and demonstrated an understanding of the instructions.   The patient was advised to call back or seek an in-person evaluation if the symptoms worsen or if the condition fails to improve as anticipated.  I provided 22  minutes of non-face-to-face time during this encounter.   Rubye Oaks, NP

## 2023-02-02 NOTE — Patient Instructions (Signed)
Continue on BiPAP at night with sleep and naps, goal is to wear all night long Work on healthy weight loss .  Do not drive if sleepy  Use caution with sedating medications Follow up in 6 months with Dr. Wynona Neat or Guiseppe Flanagan NP and As needed

## 2023-02-06 ENCOUNTER — Ambulatory Visit: Payer: Medicare PPO | Admitting: Adult Health

## 2023-02-15 DIAGNOSIS — J4520 Mild intermittent asthma, uncomplicated: Secondary | ICD-10-CM

## 2023-02-18 MED ORDER — ALBUTEROL SULFATE (2.5 MG/3ML) 0.083% IN NEBU
2.5000 mg | INHALATION_SOLUTION | Freq: Four times a day (QID) | RESPIRATORY_TRACT | 0 refills | Status: AC | PRN
Start: 2023-02-18 — End: ?

## 2023-02-24 DIAGNOSIS — G4733 Obstructive sleep apnea (adult) (pediatric): Secondary | ICD-10-CM | POA: Diagnosis not present

## 2023-03-06 DIAGNOSIS — G4733 Obstructive sleep apnea (adult) (pediatric): Secondary | ICD-10-CM | POA: Diagnosis not present

## 2023-03-16 ENCOUNTER — Other Ambulatory Visit: Payer: Self-pay | Admitting: Primary Care

## 2023-03-16 DIAGNOSIS — I10 Essential (primary) hypertension: Secondary | ICD-10-CM

## 2023-03-17 DIAGNOSIS — E1165 Type 2 diabetes mellitus with hyperglycemia: Secondary | ICD-10-CM | POA: Diagnosis not present

## 2023-03-26 ENCOUNTER — Other Ambulatory Visit: Payer: Self-pay | Admitting: Primary Care

## 2023-03-26 DIAGNOSIS — I10 Essential (primary) hypertension: Secondary | ICD-10-CM

## 2023-03-27 DIAGNOSIS — G4733 Obstructive sleep apnea (adult) (pediatric): Secondary | ICD-10-CM | POA: Diagnosis not present

## 2023-03-28 DIAGNOSIS — Z860100 Personal history of colon polyps, unspecified: Secondary | ICD-10-CM | POA: Diagnosis not present

## 2023-03-28 DIAGNOSIS — K5289 Other specified noninfective gastroenteritis and colitis: Secondary | ICD-10-CM | POA: Diagnosis not present

## 2023-04-09 ENCOUNTER — Other Ambulatory Visit: Payer: Self-pay | Admitting: Primary Care

## 2023-04-09 DIAGNOSIS — E7800 Pure hypercholesterolemia, unspecified: Secondary | ICD-10-CM

## 2023-04-16 ENCOUNTER — Other Ambulatory Visit: Payer: Self-pay | Admitting: Primary Care

## 2023-04-16 DIAGNOSIS — F419 Anxiety disorder, unspecified: Secondary | ICD-10-CM

## 2023-04-23 ENCOUNTER — Encounter: Payer: Self-pay | Admitting: Primary Care

## 2023-04-23 ENCOUNTER — Other Ambulatory Visit: Payer: Self-pay

## 2023-04-23 ENCOUNTER — Ambulatory Visit: Payer: Self-pay | Admitting: Primary Care

## 2023-04-23 NOTE — Telephone Encounter (Signed)
  Chief Complaint: Swelling Symptoms: Lump in between toes Frequency: This AM Pertinent Negatives: Patient denies fever, pain Disposition: [] ED /[] Urgent Care (no appt availability in office) / [x] Appointment(In office/virtual)/ []  [facility]/ [] Home Care/ [] Refused Recommended Disposition /[] [facility]/ []  Follow-up with PCP Additional Notes: Pt reports a small swollen lump in between her left great and second toes. Pt denies pain, fever. Reports a history of decreased circulation to BLE. OV scheduled 02/05. This RN educated pt on home care, new-worsening symptoms, when to call back/seek emergent care. Pt verbalized understanding and agrees to plan.    Copied from CRM [id number]. Topic: Clinical - Red Word Triage >> Apr 23, 2023  1:21 PM Pascal Lux wrote: Red Word that prompted transfer to Nurse Triage: Patient called and stated she noticed swelling on her foot (in between big toe and little toe)  in the area she had a 3rd degree burn. It's the size of 2 quarters. Reason for Disposition  Large swelling or bruise  Answer Assessment - Initial Assessment Questions 2. ONSET: "When did the injury happen?" (Minutes or hours ago)      Noticed this AM 3. LOCATION: "What part of the toe is injured?" "Is the nail damaged?"      Between left great toe and second toe 4. APPEARANCE of TOE INJURY: "What does the injury look like?"      Lump in between toes, looks like swelling 5. SEVERITY: "Can you use the foot normally?" "Can you walk?"      Yes  7. PAIN: "Is there pain?" If Yes, ask: "How bad is the pain?"   (e.g., Scale 1-10; or mild, moderate, severe)     None  9. DIABETES: "Do you have a history of diabetes or poor circulation in the feet?"     Yes 10. OTHER SYMPTOMS: "Do you have any other symptoms?"        Chronic decreased circulation  Protocols used: Toe Injury-A-AH

## 2023-04-23 NOTE — Telephone Encounter (Signed)
 Noted, will evaluate.

## 2023-04-25 ENCOUNTER — Ambulatory Visit: Payer: Medicare PPO | Admitting: Primary Care

## 2023-04-25 ENCOUNTER — Ambulatory Visit (INDEPENDENT_AMBULATORY_CARE_PROVIDER_SITE_OTHER)
Admission: RE | Admit: 2023-04-25 | Discharge: 2023-04-25 | Disposition: A | Discharge status: Home / Self Care | Payer: Medicare PPO | Source: Ambulatory Visit | Attending: Primary Care | Admitting: Primary Care

## 2023-04-25 VITALS — BP 132/76 | HR 75 | Temp 97.0°F

## 2023-04-25 DIAGNOSIS — M7732 Calcaneal spur, left foot: Secondary | ICD-10-CM | POA: Diagnosis not present

## 2023-04-25 DIAGNOSIS — R2242 Localized swelling, mass and lump, left lower limb: Secondary | ICD-10-CM

## 2023-04-25 DIAGNOSIS — R300 Dysuria: Secondary | ICD-10-CM | POA: Diagnosis not present

## 2023-04-25 DIAGNOSIS — M7989 Other specified soft tissue disorders: Secondary | ICD-10-CM | POA: Diagnosis not present

## 2023-04-25 DIAGNOSIS — M19072 Primary osteoarthritis, left ankle and foot: Secondary | ICD-10-CM | POA: Diagnosis not present

## 2023-04-25 DIAGNOSIS — Q6672 Congenital pes cavus, left foot: Secondary | ICD-10-CM | POA: Diagnosis not present

## 2023-04-25 HISTORY — DX: Localized swelling, mass and lump, left lower limb: R22.42

## 2023-04-25 LAB — POC URINALSYSI DIPSTICK (AUTOMATED)
Bilirubin, UA: NEGATIVE
Blood, UA: NEGATIVE
Glucose, UA: NEGATIVE
Ketones, UA: NEGATIVE
Leukocytes, UA: NEGATIVE
Nitrite, UA: NEGATIVE
Protein, UA: POSITIVE — AB
Spec Grav, UA: 1.015 (ref 1.010–1.025)
Urobilinogen, UA: 0.2 U/dL
pH, UA: 5 (ref 5.0–8.0)

## 2023-04-25 NOTE — Progress Notes (Signed)
 Subjective:    Patient ID: [name], female    DOB: 1966/01/08, 58 y.o.   MRN: [id number]  Dysuria  Associated symptoms include frequency.    [name] is a very pleasant 58 y.o. female with a significant medical history including hypertension, OSA, type 2 diabetes, chronic pain syndrome, lower extremity weakness, morbid obesity, hyperlipidemia who presents today to discuss foot swelling and dysuria.  1) Dysuria: Symptom onset 1 week ago with dysuria, suprapubic pressure, urinary frequency. She denies hematuria, fevers chills, flank pain.  She did experience some urinary leakage more recently which is not normal.  She had an episode of diarrhea a few days prior, questions if she contaminated the area of the perineum as she cannot wipe herself well.  2) Foot Swelling: Two days ago she noticed swelling to the dorsal side of her left foot, proximal and in between the great and second toes.  She denies injury, trauma, open wounds, pain, swelling to the first metatarsal joint, history of gout, erythema.  BP Readings from Last 3 Encounters:  04/25/23 132/76  01/23/23 130/78  08/24/22 134/63      Review of Systems  Genitourinary:  Positive for dysuria and frequency. Negative for vaginal discharge.  Musculoskeletal:        Left dorsal foot swelling  Skin:  Negative for color change.         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   Anemia    Anxiety    Arthritis    Asthma    Bacteremia 05/09/2022   Cataract    CHF (congestive heart failure) (HCC)    Colon polyp    Complication of anesthesia    Cystitis with hematuria 10/24/2021   Diabetes mellitus    Displaced fracture of proximal end of right fibula 07/21/2017   Edema    Fatigue    Hypertension    IBS (irritable bowel syndrome)    Lump in female breast    Methicillin resistant Staphylococcus  aureus in conditions classified elsewhere and of unspecified site    Migraines    Morbid obesity (HCC)    Neuromuscular disorder (HCC)    Night sweats    Nonspecific abnormal results of thyroid  function study    Other B-complex deficiencies    Paresthesias    PONV (postoperative nausea and vomiting)    Pure hypercholesterolemia    Reflux    Sacral fracture (HCC)    Septic shock (HCC) 05/08/2022   Sinus problem    Sleep apnea    Sore on toe (HCC) 05/17/2021   Symptomatic states associated with artificial menopause    Thyroid  disease    Type II or unspecified type diabetes mellitus without mention of complication, not stated as uncontrolled    Unspecified sleep apnea     Social History   Socioeconomic History   Marital status: Married    Spouse name: Not on file   Number of children: 3   Years of education: college   Highest education level: Associate degree: academic program  Occupational History   Occupation: Animal Nutritionist: [employer]  Tobacco Use   Smoking status: Never   Smokeless tobacco: Never  Vaping Use   Vaping status: Never Used  Substance and Sexual Activity   Alcohol use: Yes    Comment: Socially   Drug use: No   Sexual  activity: Not Currently    Birth control/protection: None    Comment: Hysterectomy  Other Topics Concern   Not on file  Social History Narrative    Married x 29 years, happily married; no abuse.    Children: 3 children; 1 grandchild; two step grandchildren; 1 gg.   Lives: with husband, mother-in-law.   Employment:  [employer] x 1996; advice worker.  Loves work.   Tobacco: never   Alcohol: socially.  One glass of wine per month.    Seatbelt: 100%   Guns: gun safe in garage.   Social Drivers of Corporate Investment Banker Strain: Low Risk  (04/25/2023)   Overall Financial Resource Strain (CARDIA)    Difficulty of Paying Living Expenses: Not very hard  Food Insecurity: No Food Insecurity (04/25/2023)   Hunger Vital Sign     Worried About Running Out of Food in the Last Year: Never true    Ran Out of Food in the Last Year: Never true  Transportation Needs: No Transportation Needs (04/25/2023)   PRAPARE - Administrator, Civil Service (Medical): No    Lack of Transportation (Non-Medical): No  Physical Activity: Inactive (04/25/2023)   Exercise Vital Sign    Days of Exercise per Week: 0 days    Minutes of Exercise per Session: 0 min  Stress: No Stress Concern Present (04/25/2023)   [employer] of Occupational Health - Occupational Stress Questionnaire    Feeling of Stress : Not at all  Social Connections: Socially Integrated (04/25/2023)   Social Connection and Isolation Panel [NHANES]    Frequency of Communication with Friends and Family: More than three times a week    Frequency of Social Gatherings with Friends and Family: Once a week    Attends Religious Services: 1 to 4 times per year    Active Member of Clubs or Organizations: No    Attends Banker Meetings: 1 to 4 times per year    Marital Status: Married  Catering Manager Violence: Not At Risk (10/03/2022)   Humiliation, Afraid, Rape, and Kick questionnaire    Fear of Current or Ex-Partner: No    Emotionally Abused: No    Physically Abused: No    Sexually Abused: No    Past Surgical History:  Procedure Laterality Date   abcess removal  03/21/1995   MRSA   ABDOMINAL HYSTERECTOMY  03/21/1995   complete in 1997, partial was in 1995   carpal tunnel release r  07/03/2013   Dalldorf   CATARACT EXTRACTION W/PHACO Left 08/10/2022   Procedure: CATARACT EXTRACTION PHACO AND INTRAOCULAR LENS PLACEMENT (IOC) LEFT DIABETIC  ASPIRE ENVISTA  LENS  4.39  00:36.5;  Surgeon: Enola Feliciano Hugger, MD;  Location: [facility];  Service: Ophthalmology;  Laterality: Left;   CATARACT EXTRACTION W/PHACO Right 08/24/2022   Procedure: CATARACT EXTRACTION PHACO AND INTRAOCULAR LENS PLACEMENT (IOC) RIGHT DIABETIC  ASPIRE ENVISTA LENS  5.09   00:31.3;  Surgeon: Enola Feliciano Hugger, MD;  Location: [facility];  Service: Ophthalmology;  Laterality: Right;   CESAREAN SECTION  1987  and 1989   CHOLECYSTECTOMY  03/20/1989   COLONOSCOPY  03/21/2011   Normal.  [facility].   KNEE SURGERY  03/20/2004   LAPAROSCOPIC GASTRIC BANDING  05/18/2009   SPINE SURGERY     TONSILLECTOMY AND ADENOIDECTOMY  03/20/1972   TUBAL LIGATION     ULNAR TUNNEL RELEASE Right 02/05/2018   Procedure: CUBITAL TUNNEL RELEASE/DECOMPRESSION;  Surgeon: Sheril Coy, MD;  Location: [facility];  Service: Orthopedics;  Laterality: Right;    Family History  Problem Relation Age of Onset   Diabetes Mother    Heart disease Mother 37       CHF, CAD   Other Mother        muscle disease   Hypertension Mother    Hyperlipidemia Mother    Arthritis Mother        OA hip L s/p THR   Leukemia Mother 5   Kidney disease Mother    Diabetes Father    Heart disease Father        AMI/CABG/valve replacement age 83.   Stroke Father    Diabetes Brother    Heart disease Brother        stent age 63.   Hypertension Brother    Obesity Brother    Stroke Brother    Cancer Paternal Aunt    Varicose Veins Paternal Aunt    Alzheimer's disease Maternal Grandmother    Emphysema Maternal Grandfather    Heart disease Paternal Grandmother    Fibromyalgia Daughter    Crohn's disease Daughter    Asthma Daughter    Obesity Daughter    Asthma Daughter    Hypertension Son    Breast cancer Other        2 Aunts    Allergies  Allergen Reactions   Amoxicillin Rash, Shortness Of Breath and Other (See Comments)    Patient states she was previously able to tolerate Keflex without issue   Bee Venom Anaphylaxis   Levofloxacin Rash, Shortness Of Breath and Other (See Comments)   Meloxicam  Hives, Rash and Other (See Comments)   Vancomycin Anaphylaxis and Other (See Comments)   Ciprofloxacin  Hives, Itching, Swelling and Other (See Comments)   Sulfa  Antibiotics Rash and Other  (See Comments)   Ace Inhibitors Cough and Other (See Comments)   Codeine Other (See Comments)   Meperidine Hcl Other (See Comments)   Oxycodone  Other (See Comments)   Oxycodone -Acetaminophen     Penicillin G Other (See Comments)   Tyloxapol     Other reaction(s): vomiting/rash   Penicillins Swelling and Rash    Has patient had a PCN reaction causing immediate rash, facial/tongue/throat swelling, SOB or lightheadedness with hypotension: Yes Has patient had a PCN reaction causing severe rash involving mucus membranes or skin necrosis: Yes Has patient had a PCN reaction that required hospitalization: No Has patient had a PCN reaction occurring within the last 10 years: No If all of the above answers are NO, then may proceed with Cephalosporin use.    Tequin [Gatifloxacin] Rash    Current Outpatient Medications on File Prior to Visit  Medication Sig Dispense Refill   acetaminophen  (TYLENOL ) 325 MG tablet Take 650 mg by mouth every 6 (six) hours as needed.     albuterol  (PROVENTIL ) (2.5 MG/3ML) 0.083% nebulizer solution Take 3 mLs (2.5 mg total) by nebulization every 6 (six) hours as needed for wheezing or shortness of breath. 75 mL 0   albuterol  (VENTOLIN  HFA) 108 (90 Base) MCG/ACT inhaler Inhale 1-2 puffs into the lungs every 6 (six) hours as needed for wheezing or shortness of breath. 18 g 1   atorvastatin  (LIPITOR) 40 MG tablet TAKE ONE TABLET BY MOUTH ONCE A DAY FOR CHOLESTEROL. 90 tablet 2   Bacillus Coagulans-Inulin (PROBIOTIC-PREBIOTIC PO) Take by mouth.     budesonide  (ENTOCORT EC ) 3 MG 24 hr capsule Take 6 mg by mouth daily.     colestipol (COLESTID) 1 g tablet Take 1  g by mouth 2 (two) times daily. Patient takes 2 tablets BID     Continuous Blood Gluc Receiver (DEXCOM G7 RECEIVER) DEVI by Does not apply route.     Continuous Blood Gluc Sensor (DEXCOM G7 SENSOR) MISC by Does not apply route.     cyclobenzaprine  (FLEXERIL ) 10 MG tablet Take 1 tablet (10 mg total) by mouth 3 (three)  times daily as needed for muscle spasms. 270 tablet 2   Dulaglutide  4.5 MG/0.5ML SOPN Inject into the skin.     EPINEPHrine  (EPIPEN  2-PAK) 0.3 mg/0.3 mL IJ SOAJ injection Inject 0.3 mg into the muscle as needed for anaphylaxis. 1 each 1   ergocalciferol  (VITAMIN D2) 1.25 MG (50000 UT) capsule Take by mouth.     fexofenadine (ALLEGRA) 180 MG tablet 1 tablet     FLUoxetine  (PROZAC ) 40 MG capsule TAKE ONE CAPSULE BY MOUTH ONCE DAILY FOR ANXIETY AND DEPRESSION 90 capsule 2   fluticasone  (FLONASE ) 50 MCG/ACT nasal spray Place 1 spray into both nostrils 2 (two) times daily. 48 mL 1   gabapentin  (NEURONTIN ) 600 MG tablet Take 1 tablet (600 mg total) by mouth 2 (two) times daily. For pain 180 tablet 2   hydrochlorothiazide  (HYDRODIURIL ) 12.5 MG tablet TAKE ONE TABLET BY MOUTH ONCE DAILY FOR BLOOD PRESSURE 90 tablet 2   insulin  aspart (NOVOLOG  FLEXPEN) 100 UNIT/ML FlexPen Inject 22-26 Units into the skin 3 (three) times daily with meals. 30 mL 5   insulin  degludec (TRESIBA ) 100 UNIT/ML FlexTouch Pen Inject 40 Units into the skin 2 (two) times daily.     Insulin  Pen Needle 32G X 4 MM MISC Use 1 needle with pen as directed 100 each 11   ketoconazole  (NIZORAL ) 2 % cream Apply 1 Application topically daily as needed for irritation. 30 g 0   levocetirizine (XYZAL ) 5 MG tablet TAKE ONE TABLET BY MOUTH EVERY EVENING 90 tablet 1   losartan  (COZAAR ) 50 MG tablet TAKE ONE TABLET BY MOUTH ONCE DAILY FOR BLOOD PRESSURE 90 tablet 2   metoprolol  tartrate (LOPRESSOR ) 25 MG tablet Take 0.5 tablets (12.5 mg total) by mouth 2 (two) times daily. for blood pressure. 90 tablet 2   Multiple Vitamin (MULTIVITAMIN PO) Take 1 tablet by mouth daily.      Nebulizer MISC Dispense 1 nebulizer for home use, with tubing and supplies. J45.909. 1 each 0   ondansetron  (ZOFRAN -ODT) 4 MG disintegrating tablet TAKE 1 TABLET BY MOUTH EVERY 8 HOURS AS NEEDED FOR NAUSEA AS DIRECTED. 20 tablet 0   VALERIAN ROOT PO Take 1,000 mg by mouth at  bedtime.     Current Facility-Administered Medications on File Prior to Visit  Medication Dose Route Frequency Provider Last Rate Last Admin   silver  sulfADIAZINE  (SILVADENE ) 1 % cream 1 Application  1 Application Topical Once Cable, Lynwood HERO, NP        BP 132/76   Pulse 75   Temp (!) 97 F (36.1 C) (Temporal)   SpO2 98%  Objective:   Physical Exam Cardiovascular:     Rate and Rhythm: Normal rate and regular rhythm.  Pulmonary:     Effort: Pulmonary effort is normal.  Skin:    General: Skin is warm and dry.     Findings: No erythema.     Comments: 2 cm X 2 cm circular, superficial swelling to the left dorsal foot proximal and in between great and second toe.  Tenderness upon mild palpation.  No warmth.   Neurological:     Mental Status:  She is alert.           Assessment & Plan:  Dysuria Assessment & Plan: Urinalysis today negative.  Discussed other differentials for dysuria including vaginal atrophy. Discussed vaginal moisturizers/creams.  Discussed Kegel exercises for urinary incontinence/leakage.    Orders: -     POCT Urinalysis Dipstick (Automated)  Localized swelling of left foot Assessment & Plan: Unclear etiology. Exam today inconsistent for gout, DVT, or cellulitis.  Exam almost representative of a lipoma or cyst.  Labs pending today for uric acid and CBC with differential. X-ray ordered and pending.  Await results.   Orders: -     Uric acid -     CBC with Differential/Platelet -     DG Foot Complete Left        Maria MARLA Gaskins, NP

## 2023-04-25 NOTE — Assessment & Plan Note (Signed)
 Urinalysis today negative.  Discussed other differentials for dysuria including vaginal atrophy. Discussed vaginal moisturizers/creams.  Discussed Kegel exercises for urinary incontinence/leakage.

## 2023-04-25 NOTE — Patient Instructions (Signed)
Stop by the lab and xray prior to leaving today. I will notify you of your results once received.   It was a pleasure to see you today!   

## 2023-04-25 NOTE — Assessment & Plan Note (Addendum)
 Unclear etiology. Exam today inconsistent for gout, DVT, or cellulitis.  Exam almost representative of a lipoma or cyst.  Labs pending today for uric acid and CBC with differential. X-ray ordered and pending.  Await results.

## 2023-04-26 LAB — CBC WITH DIFFERENTIAL/PLATELET
Basophils Absolute: 0.1 10*3/uL (ref 0.0–0.1)
Basophils Relative: 1 % (ref 0.0–3.0)
Eosinophils Absolute: 0.1 10*3/uL (ref 0.0–0.7)
Eosinophils Relative: 1.1 % (ref 0.0–5.0)
HCT: 44 % (ref 36.0–46.0)
Hemoglobin: 14.4 g/dL (ref 12.0–15.0)
Lymphocytes Relative: 40 % (ref 12.0–46.0)
Lymphs Abs: 3.5 10*3/uL (ref 0.7–4.0)
MCHC: 32.8 g/dL (ref 30.0–36.0)
MCV: 92.3 fL (ref 78.0–100.0)
Monocytes Absolute: 0.6 10*3/uL (ref 0.1–1.0)
Monocytes Relative: 6.8 % (ref 3.0–12.0)
Neutro Abs: 4.5 10*3/uL (ref 1.4–7.7)
Neutrophils Relative %: 51.1 % (ref 43.0–77.0)
Platelets: 324 10*3/uL (ref 150.0–400.0)
RBC: 4.76 Mil/uL (ref 3.87–5.11)
RDW: 14.2 % (ref 11.5–15.5)
WBC: 8.7 10*3/uL (ref 4.0–10.5)

## 2023-04-26 LAB — URIC ACID: Uric Acid, Serum: 6.2 mg/dL (ref 2.4–7.0)

## 2023-04-27 DIAGNOSIS — G4733 Obstructive sleep apnea (adult) (pediatric): Secondary | ICD-10-CM | POA: Diagnosis not present

## 2023-05-25 DIAGNOSIS — G4733 Obstructive sleep apnea (adult) (pediatric): Secondary | ICD-10-CM | POA: Diagnosis not present

## 2023-05-30 DIAGNOSIS — K5289 Other specified noninfective gastroenteritis and colitis: Secondary | ICD-10-CM | POA: Diagnosis not present

## 2023-06-05 DIAGNOSIS — G4733 Obstructive sleep apnea (adult) (pediatric): Secondary | ICD-10-CM | POA: Diagnosis not present

## 2023-06-08 ENCOUNTER — Telehealth: Admitting: Physician Assistant

## 2023-06-08 DIAGNOSIS — B9689 Other specified bacterial agents as the cause of diseases classified elsewhere: Secondary | ICD-10-CM

## 2023-06-08 DIAGNOSIS — J069 Acute upper respiratory infection, unspecified: Secondary | ICD-10-CM

## 2023-06-08 MED ORDER — DOXYCYCLINE HYCLATE 100 MG PO TABS: 100.0000 mg | ORAL_TABLET | Freq: Two times a day (BID) | ORAL | 0 refills | Status: DC

## 2023-06-08 NOTE — Progress Notes (Signed)

## 2023-06-11 DIAGNOSIS — E1165 Type 2 diabetes mellitus with hyperglycemia: Secondary | ICD-10-CM | POA: Diagnosis not present

## 2023-06-11 DIAGNOSIS — Z794 Long term (current) use of insulin: Secondary | ICD-10-CM | POA: Diagnosis not present

## 2023-06-11 LAB — HEMOGLOBIN A1C: Hemoglobin A1C: 6.5

## 2023-06-12 ENCOUNTER — Encounter: Payer: Self-pay | Admitting: Family Medicine

## 2023-06-12 ENCOUNTER — Ambulatory Visit: Admitting: Family Medicine

## 2023-06-12 VITALS — BP 128/76 | HR 83 | Temp 97.8°F | Ht 66.5 in

## 2023-06-12 DIAGNOSIS — H1045 Other chronic allergic conjunctivitis: Secondary | ICD-10-CM | POA: Insufficient documentation

## 2023-06-12 DIAGNOSIS — J300 Vasomotor rhinitis: Secondary | ICD-10-CM | POA: Insufficient documentation

## 2023-06-12 DIAGNOSIS — Z794 Long term (current) use of insulin: Secondary | ICD-10-CM

## 2023-06-12 DIAGNOSIS — E1140 Type 2 diabetes mellitus with diabetic neuropathy, unspecified: Secondary | ICD-10-CM | POA: Diagnosis not present

## 2023-06-12 DIAGNOSIS — J019 Acute sinusitis, unspecified: Secondary | ICD-10-CM | POA: Diagnosis not present

## 2023-06-12 MED ORDER — INSULIN DEGLUDEC 100 UNIT/ML ~~LOC~~ SOPN: 30.0000 [IU] | PEN_INJECTOR | Freq: Two times a day (BID) | SUBCUTANEOUS | Status: AC

## 2023-06-12 MED ORDER — NOVOLOG FLEXPEN 100 UNIT/ML ~~LOC~~ SOPN: 18.0000 [IU] | PEN_INJECTOR | Freq: Three times a day (TID) | SUBCUTANEOUS | Status: AC

## 2023-06-12 MED ORDER — BUDESONIDE-FORMOTEROL FUMARATE 80-4.5 MCG/ACT IN AERO: 2.0000 | INHALATION_SPRAY | Freq: Two times a day (BID) | RESPIRATORY_TRACT | 3 refills | Status: AC

## 2023-06-12 NOTE — Progress Notes (Unsigned)
 Inc chest congestion over the last 2 weeks.  Multiple sick contacts at home.  No fevers.  Some occ sputum.  Prev with ear pain but not now.  Still with frontal and maxillary sinus pressure.  HA recently, top of the head.  Some wheeze.  Using albuterol daily, with some relief.  She only uses SABA with concurrent illness.  No rash. No vomiting.  She doesn't feel worse over the last few days.    Allergy list d/w pt.  She can tolerate oxycodone.   Recently started on doxy.    Is changing from trulicity to [facility].    Meds, vitals, and allergies reviewed.   ROS: Per HPI unless specifically indicated in ROS section   Trace BLE edema.   Rrr Ctab Sinuses ttp x4.   TM wnl MMM Neck supple no LA  Continue doxy and add symbicort.

## 2023-06-12 NOTE — Patient Instructions (Signed)
 Continue doxy and add symbicort.  Rinse after use.  Use albuterol as needed.   Rest and fluids.  Update [place] as needed.  Take care.  Glad to see you.

## 2023-06-13 DIAGNOSIS — J019 Acute sinusitis, unspecified: Secondary | ICD-10-CM | POA: Insufficient documentation

## 2023-06-13 NOTE — Assessment & Plan Note (Signed)
 Discussed is.  Doxycycline would not yet have had time to work. Continue doxy and add symbicort.  Rinse after use.  Use albuterol as needed.   Rest and fluids.  Update [place] as needed.  She agrees to plan.  Okay for outpatient follow-up.

## 2023-06-15 DIAGNOSIS — E1165 Type 2 diabetes mellitus with hyperglycemia: Secondary | ICD-10-CM | POA: Diagnosis not present

## 2023-06-25 DIAGNOSIS — G4733 Obstructive sleep apnea (adult) (pediatric): Secondary | ICD-10-CM | POA: Diagnosis not present

## 2023-07-03 ENCOUNTER — Ambulatory Visit: Admitting: Primary Care

## 2023-07-03 ENCOUNTER — Encounter: Payer: Self-pay | Admitting: Primary Care

## 2023-07-03 ENCOUNTER — Encounter (HOSPITAL_BASED_OUTPATIENT_CLINIC_OR_DEPARTMENT_OTHER): Payer: Self-pay

## 2023-07-03 ENCOUNTER — Ambulatory Visit (INDEPENDENT_AMBULATORY_CARE_PROVIDER_SITE_OTHER)
Admission: RE | Admit: 2023-07-03 | Discharge: 2023-07-03 | Disposition: A | Discharge status: Home / Self Care | Source: Ambulatory Visit | Attending: Primary Care | Admitting: Primary Care

## 2023-07-03 ENCOUNTER — Other Ambulatory Visit: Payer: Self-pay

## 2023-07-03 ENCOUNTER — Other Ambulatory Visit: Payer: Self-pay | Admitting: Primary Care

## 2023-07-03 ENCOUNTER — Emergency Department (HOSPITAL_BASED_OUTPATIENT_CLINIC_OR_DEPARTMENT_OTHER)
Admission: EM | Admit: 2023-07-03 | Discharge: 2023-07-04 | Disposition: A | Discharge status: Home / Self Care | Attending: Emergency Medicine | Admitting: Emergency Medicine

## 2023-07-03 VITALS — BP 112/58 | HR 63 | Temp 96.9°F | Ht 66.5 in | Wt 311.0 lb

## 2023-07-03 DIAGNOSIS — R0989 Other specified symptoms and signs involving the circulatory and respiratory systems: Secondary | ICD-10-CM

## 2023-07-03 DIAGNOSIS — X58XXXA Exposure to other specified factors, initial encounter: Secondary | ICD-10-CM | POA: Diagnosis not present

## 2023-07-03 DIAGNOSIS — R600 Localized edema: Secondary | ICD-10-CM

## 2023-07-03 DIAGNOSIS — M7989 Other specified soft tissue disorders: Secondary | ICD-10-CM | POA: Diagnosis not present

## 2023-07-03 DIAGNOSIS — S90421A Blister (nonthermal), right great toe, initial encounter: Secondary | ICD-10-CM | POA: Insufficient documentation

## 2023-07-03 DIAGNOSIS — M19071 Primary osteoarthritis, right ankle and foot: Secondary | ICD-10-CM | POA: Diagnosis not present

## 2023-07-03 DIAGNOSIS — D692 Other nonthrombocytopenic purpura: Secondary | ICD-10-CM | POA: Insufficient documentation

## 2023-07-03 DIAGNOSIS — M7731 Calcaneal spur, right foot: Secondary | ICD-10-CM | POA: Diagnosis not present

## 2023-07-03 LAB — COMPREHENSIVE METABOLIC PANEL WITH GFR
ALT: 37 U/L — ABNORMAL HIGH (ref 0–35)
AST: 23 U/L (ref 0–37)
Albumin: 3.7 g/dL (ref 3.5–5.2)
Alkaline Phosphatase: 54 U/L (ref 39–117)
BUN: 27 mg/dL — ABNORMAL HIGH (ref 6–23)
CO2: 31 meq/L (ref 19–32)
Calcium: 9.1 mg/dL (ref 8.4–10.5)
Chloride: 102 meq/L (ref 96–112)
Creatinine, Ser: 1.05 mg/dL (ref 0.40–1.20)
GFR: 58.35 mL/min — ABNORMAL LOW (ref >60.00)
Glucose, Bld: 188 mg/dL — ABNORMAL HIGH (ref 70–99)
Potassium: 4.3 meq/L (ref 3.5–5.1)
Sodium: 143 meq/L (ref 135–145)
Total Bilirubin: 0.4 mg/dL (ref 0.2–1.2)
Total Protein: 6.1 g/dL (ref 6.0–8.3)

## 2023-07-03 LAB — CBC
HCT: 42.4 % (ref 36.0–46.0)
Hemoglobin: 13.8 g/dL (ref 12.0–15.0)
MCHC: 32.6 g/dL (ref 30.0–36.0)
MCV: 94.6 fl (ref 78.0–100.0)
Platelets: 277 10*3/uL (ref 150.0–400.0)
RBC: 4.48 Mil/uL (ref 3.87–5.11)
RDW: 16 % — ABNORMAL HIGH (ref 11.5–15.5)
WBC: 8.1 10*3/uL (ref 4.0–10.5)

## 2023-07-03 LAB — BRAIN NATRIURETIC PEPTIDE: Pro B Natriuretic peptide (BNP): 24 pg/mL (ref 0.0–100.0)

## 2023-07-03 NOTE — Assessment & Plan Note (Signed)
 Moderate today with decreased pedal pulses.  Checking labs today including BNP, CMP, CBC. Consider echocardiogram. Await results.

## 2023-07-03 NOTE — Progress Notes (Signed)
 Subjective:    Patient ID: [name], female    DOB: January 24, 1966, 58 y.o.   MRN: [id number]  Toe Pain     [name] is a very pleasant 58 y.o. female with a history of hypertension, OSA, type 2 diabetes, chronic back pain, lower extremity weakness who presents today to discuss multiple concerns.  1) Toe Pain/Lower Extremity Edema: Acute to the right great toe. Over the last 3 months she's noticed a blood blister with gradual increase in size. She's also noticed increased bilateral lower extremity edema and abdominal edema.   She has noticed increased dyspnea with walking through her home. She does not weigh herself at home. She denies cough, calf pain.   She spends most of her day in a recliner with her feet elevated or in bed. She has a family history of lower extremity amputation in her father.   She has limited sensation to her right lower extremity that dates back to 2019 after her back surgery. She does have some sensation to her left lower extremity.  She does take aspirin 81 mg daily. She is compliant to her atorvastatin.   Wt Readings from Last 3 Encounters:  07/03/23 (!) 311 lb (141.1 kg)  01/23/23 (!) 311 lb (141.1 kg)  10/03/22 (!) 311 lb (141.1 kg)     2) Bruising: Chronic and and intermittent for the last 1 year. Her bruising is located to the bilateral posterior forearms (anatomical position). Her bruising varies in color, mostly light and dark purple, sometimes dark red. She denies bruising elsewhere.    Review of Systems  Respiratory:  Positive for shortness of breath. Negative for cough.   Cardiovascular:  Positive for leg swelling. Negative for chest pain.  Skin:  Positive for color change.         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   Anemia    Anxiety    Arthritis    Asthma    Bacteremia 05/09/2022   Cataract    CHF  (congestive heart failure) (HCC)    Colon polyp    Complication of anesthesia    Cystitis with hematuria 10/24/2021   Diabetes mellitus    Displaced fracture of proximal end of right fibula 07/21/2017   Edema    Fatigue    Hypertension    IBS (irritable bowel syndrome)    Localized swelling of left foot 04/25/2023   Lump in female breast    Methicillin resistant Staphylococcus aureus in conditions classified elsewhere and of unspecified site    Migraines    Morbid obesity (HCC)    Neuromuscular disorder (HCC)    Night sweats    Nonspecific abnormal results of thyroid function study    Other B-complex deficiencies    Paresthesias    PONV (postoperative nausea and vomiting)    Pure hypercholesterolemia    Reflux    Sacral fracture (HCC)    Septic shock (HCC) 05/08/2022   Sinus problem    Sleep apnea    Sore on toe (HCC) 05/17/2021   Symptomatic states associated with artificial menopause    Thyroid disease    Type II or unspecified type diabetes mellitus without mention of complication, not stated as uncontrolled    Unspecified sleep apnea     Social History   Socioeconomic History   Marital status: Married    Spouse name: Not on  file   Number of children: 3   Years of education: college   Highest education level: Associate degree: academic program  Occupational History   Occupation: Animal nutritionist: [employer]  Tobacco Use   Smoking status: Never   Smokeless tobacco: Never  Vaping Use   Vaping status: Never Used  Substance and Sexual Activity   Alcohol use: Yes    Comment: Socially   Drug use: No   Sexual activity: Not Currently    Birth control/protection: None    Comment: Hysterectomy  Other Topics Concern   Not on file  Social History Narrative    Married x 29 years, happily married; no abuse.    Children: 3 children; 1 grandchild; two step grandchildren; 1 gg.   Lives: with husband, mother-in-law.   Employment:  [employer] x 1996; Therapist, nutritional.  Loves work.   Tobacco: never   Alcohol: socially.  One glass of wine per month.    Seatbelt: 100%   Guns: gun safe in garage.   Social Drivers of Corporate investment banker Strain: Low Risk  (04/25/2023)   Overall Financial Resource Strain (CARDIA)    Difficulty of Paying Living Expenses: Not very hard  Food Insecurity: Low Risk  (06/11/2023)   Received from [facility]   Hunger Vital Sign    Worried About Running Out of Food in the Last Year: Never true    Ran Out of Food in the Last Year: Never true  Transportation Needs: No Transportation Needs (06/11/2023)   Received from [facility]    In the past 12 months, has lack of reliable transportation kept you from medical appointments, meetings, work or from getting things needed for daily living? : No  Physical Activity: Inactive (04/25/2023)   Exercise Vital Sign    Days of Exercise per Week: 0 days    Minutes of Exercise per Session: 0 min  Stress: No Stress Concern Present (04/25/2023)   [employer] of Occupational Health - Occupational Stress Questionnaire    Feeling of Stress : Not at all  Social Connections: Socially Integrated (04/25/2023)   Social Connection and Isolation Panel [NHANES]    Frequency of Communication with Friends and Family: More than three times a week    Frequency of Social Gatherings with Friends and Family: Once a week    Attends Religious Services: 1 to 4 times per year    Active Member of Clubs or Organizations: No    Attends Banker Meetings: 1 to 4 times per year    Marital Status: Married  Catering manager Violence: Not At Risk (10/03/2022)   Humiliation, Afraid, Rape, and Kick questionnaire    Fear of Current or Ex-Partner: No    Emotionally Abused: No    Physically Abused: No    Sexually Abused: No    Past Surgical History:  Procedure Laterality Date   abcess removal  03/21/1995   MRSA   ABDOMINAL HYSTERECTOMY  03/21/1995   complete in 1997, partial  was in 1995   carpal tunnel release r  07/03/2013   Dalldorf   CATARACT EXTRACTION W/PHACO Left 08/10/2022   Procedure: CATARACT EXTRACTION PHACO AND INTRAOCULAR LENS PLACEMENT (IOC) LEFT DIABETIC  ASPIRE ENVISTA  LENS  4.39  00:36.5;  Surgeon: Trudi Fus, MD;  Location: [facility];  Service: Ophthalmology;  Laterality: Left;   CATARACT EXTRACTION W/PHACO Right 08/24/2022   Procedure: CATARACT EXTRACTION PHACO AND INTRAOCULAR LENS PLACEMENT (IOC)  RIGHT DIABETIC  ASPIRE ENVISTA LENS  5.09  00:31.3;  Surgeon: Estanislado Pandy, MD;  Location: [facility];  Service: Ophthalmology;  Laterality: Right;   CESAREAN SECTION  1987  and 1989   CHOLECYSTECTOMY  03/20/1989   COLONOSCOPY  03/21/2011   Normal.  [facility].   KNEE SURGERY  03/20/2004   LAPAROSCOPIC GASTRIC BANDING  05/18/2009   SPINE SURGERY     TONSILLECTOMY AND ADENOIDECTOMY  03/20/1972   TUBAL LIGATION     ULNAR TUNNEL RELEASE Right 02/05/2018   Procedure: CUBITAL TUNNEL RELEASE/DECOMPRESSION;  Surgeon: Marcene Corning, MD;  Location: [facility];  Service: Orthopedics;  Laterality: Right;    Family History  Problem Relation Age of Onset   Diabetes Mother    Heart disease Mother 64       CHF, CAD   Other Mother        muscle disease   Hypertension Mother    Hyperlipidemia Mother    Arthritis Mother        OA hip L s/p THR   Leukemia Mother 78   Kidney disease Mother    Diabetes Father    Heart disease Father        AMI/CABG/valve replacement age 66.   Stroke Father    Diabetes Brother    Heart disease Brother        stent age 23.   Hypertension Brother    Obesity Brother    Stroke Brother    Cancer Paternal Aunt    Varicose Veins Paternal Aunt    Alzheimer's disease Maternal Grandmother    Emphysema Maternal Grandfather    Heart disease Paternal Grandmother    Fibromyalgia Daughter    Crohn's disease Daughter    Asthma Daughter    Obesity Daughter    Asthma Daughter    Hypertension  Son    Breast cancer Other        2 Aunts    Allergies  Allergen Reactions   Amoxicillin Rash, Shortness Of Breath and Other (See Comments)    Patient states she was previously able to tolerate Keflex without issue   Bee Venom Anaphylaxis   Levofloxacin Rash, Shortness Of Breath and Other (See Comments)   Meloxicam Hives, Rash and Other (See Comments)   Vancomycin Anaphylaxis and Other (See Comments)   Ciprofloxacin Hives, Itching, Swelling and Other (See Comments)   Sulfa Antibiotics Rash and Other (See Comments)   Ace Inhibitors Cough and Other (See Comments)   Codeine Other (See Comments)   Meperidine Hcl Other (See Comments)   Tyloxapol     Other reaction(s): vomiting/rash   Tequin [Gatifloxacin] Rash    Current Outpatient Medications on File Prior to Visit  Medication Sig Dispense Refill   acetaminophen (TYLENOL) 325 MG tablet Take 650 mg by mouth every 6 (six) hours as needed.     albuterol (PROVENTIL) (2.5 MG/3ML) 0.083% nebulizer solution Take 3 mLs (2.5 mg total) by nebulization every 6 (six) hours as needed for wheezing or shortness of breath. 75 mL 0   albuterol (VENTOLIN HFA) 108 (90 Base) MCG/ACT inhaler Inhale 1-2 puffs into the lungs every 6 (six) hours as needed for wheezing or shortness of breath. 18 g 1   atorvastatin (LIPITOR) 40 MG tablet TAKE ONE TABLET BY MOUTH ONCE A DAY FOR CHOLESTEROL. 90 tablet 2   Bacillus Coagulans-Inulin (PROBIOTIC-PREBIOTIC PO) Take by mouth.     budesonide (ENTOCORT EC) 3 MG 24 hr capsule Take 6 mg by mouth daily.  budesonide-formoterol (SYMBICORT) 80-4.5 MCG/ACT inhaler Inhale 2 puffs into the lungs 2 (two) times daily. Rinse after use. 1 each 3   colestipol (COLESTID) 1 g tablet Take 1 g by mouth 2 (two) times daily. Patient takes 2 tablets BID     Continuous Blood Gluc Receiver (DEXCOM G7 RECEIVER) DEVI by Does not apply route.     Continuous Blood Gluc Sensor (DEXCOM G7 SENSOR) MISC by Does not apply route.     cyclobenzaprine  (FLEXERIL) 10 MG tablet Take 1 tablet (10 mg total) by mouth 3 (three) times daily as needed for muscle spasms. 270 tablet 2   EPINEPHrine (EPIPEN 2-PAK) 0.3 mg/0.3 mL IJ SOAJ injection Inject 0.3 mg into the muscle as needed for anaphylaxis. 1 each 1   ergocalciferol (VITAMIN D2) 1.25 MG (50000 UT) capsule Take by mouth.     fexofenadine (ALLEGRA) 180 MG tablet 1 tablet     FLUoxetine (PROZAC) 40 MG capsule TAKE ONE CAPSULE BY MOUTH ONCE DAILY FOR ANXIETY AND DEPRESSION 90 capsule 2   fluticasone (FLONASE) 50 MCG/ACT nasal spray Place 1 spray into both nostrils 2 (two) times daily. 48 mL 1   gabapentin (NEURONTIN) 600 MG tablet Take 1 tablet (600 mg total) by mouth 2 (two) times daily. For pain 180 tablet 2   hydrochlorothiazide (HYDRODIURIL) 12.5 MG tablet TAKE ONE TABLET BY MOUTH ONCE DAILY FOR BLOOD PRESSURE 90 tablet 2   insulin aspart (NOVOLOG FLEXPEN) 100 UNIT/ML FlexPen Inject 18-22 Units into the skin 3 (three) times daily with meals.     insulin degludec (TRESIBA) 100 UNIT/ML FlexTouch Pen Inject 30 Units into the skin 2 (two) times daily.     Insulin Pen Needle 32G X 4 MM MISC Use 1 needle with pen as directed 100 each 11   ketoconazole (NIZORAL) 2 % cream Apply 1 Application topically daily as needed for irritation. 30 g 0   levocetirizine (XYZAL) 5 MG tablet TAKE ONE TABLET BY MOUTH EVERY EVENING 90 tablet 1   losartan (COZAAR) 50 MG tablet TAKE ONE TABLET BY MOUTH ONCE DAILY FOR BLOOD PRESSURE 90 tablet 2   metoprolol tartrate (LOPRESSOR) 25 MG tablet Take 0.5 tablets (12.5 mg total) by mouth 2 (two) times daily. for blood pressure. 90 tablet 2   MOUNJARO 5 MG/0.5ML Pen Inject 5 mg into the skin once a week.     Multiple Vitamin (MULTIVITAMIN PO) Take 1 tablet by mouth daily.      Nebulizer MISC Dispense 1 nebulizer for home use, with tubing and supplies. J45.909. 1 each 0   ondansetron (ZOFRAN-ODT) 4 MG disintegrating tablet TAKE 1 TABLET BY MOUTH EVERY 8 HOURS AS NEEDED FOR NAUSEA  AS DIRECTED. 20 tablet 0   VALERIAN ROOT PO Take 1,000 mg by mouth at bedtime.     doxycycline (VIBRA-TABS) 100 MG tablet Take 1 tablet (100 mg total) by mouth 2 (two) times daily. (Patient not taking: Reported on 07/03/2023) 20 tablet 0   Dulaglutide 4.5 MG/0.5ML SOPN Inject into the skin. (Patient not taking: Reported on 07/03/2023)     Current Facility-Administered Medications on File Prior to Visit  Medication Dose Route Frequency Provider Last Rate Last Admin   silver sulfADIAZINE (SILVADENE) 1 % cream 1 Application  1 Application Topical Once Eden Emms, NP        BP (!) 112/58   Pulse 63   Temp (!) 96.9 F (36.1 C) (Temporal)   Ht 5' 6.5" (1.689 m)   Wt (!) 311 lb (141.1  kg)   SpO2 95%   BMI 49.44 kg/m  Objective:   Physical Exam Cardiovascular:     Rate and Rhythm: Normal rate and regular rhythm.     Pulses:          Posterior tibial pulses are 1+ on the right side and 1+ on the left side.     Comments: Skin cool to touch to bilateral feet. Pulmonary:     Effort: Pulmonary effort is normal.     Breath sounds: Normal breath sounds.  Musculoskeletal:     Right lower leg: 2+ Pitting Edema present.     Left lower leg: 2+ Pitting Edema present.  Skin:    General: Skin is warm and dry.     Findings: Bruising present.     Comments: Moderate subcutaneous bleeding to tip of right great toe.  Several older wounds to anterior toes. Scabbing.  Neurological:     Mental Status: She is alert.           Assessment & Plan:  Bilateral lower extremity edema Assessment & Plan: Moderate today with decreased pedal pulses.  Checking labs today including BNP, CMP, CBC. Consider echocardiogram. Await results.   Orders: -     CBC -     Brain natriuretic peptide -     US  ARTERIAL SEG MULTIPLE LE (ABI, SEGMENTAL PRESSURES, PVR'S); Future -     Ambulatory referral to Vascular Surgery -     Comprehensive metabolic panel with GFR -     DG Foot Complete Right  Decreased pedal  pulses Assessment & Plan: Concern for circulatory cause. Need to rule out ischemic foot.  Stat ABIs ordered and pending. Stat referral placed to vascular surgery.  Xray ordered and pending to rule out necrosis of right toe/foot.    Orders: -     CBC -     US  ARTERIAL SEG MULTIPLE LE (ABI, SEGMENTAL PRESSURES, PVR'S); Future -     Ambulatory referral to Vascular Surgery -     DG Foot Complete Right  Senile purpura (HCC) Assessment & Plan: Exam today representative of senile purpura to forearms. Discussed with patient today.  Hold aspirin 81 mg for now. She may require to resume aspirin versus addition of Plavix given potential circulatory disorder.  Workup pending.  CBC ordered and pending.         Desirre Eickhoff K Quaron Delacruz, NP

## 2023-07-03 NOTE — Assessment & Plan Note (Addendum)
 Exam today representative of senile purpura to forearms. Discussed with patient today.  Hold aspirin 81 mg for now. She may require to resume aspirin versus addition of Plavix given potential circulatory disorder.  Workup pending.  CBC ordered and pending.

## 2023-07-03 NOTE — Assessment & Plan Note (Addendum)
 Concern for circulatory cause. Need to rule out ischemic foot.  Stat ABIs ordered and pending. Stat referral placed to vascular surgery.  Xray ordered and pending to rule out necrosis of right toe/foot.

## 2023-07-03 NOTE — ED Triage Notes (Signed)
 Right 1st toe with wound since January, with "small black blister" now much larger. Pt endorses "no feeling in lower in right thigh down"  Went to PCP today and recommended to go to ER

## 2023-07-03 NOTE — Patient Instructions (Signed)
 Stop by the lab and xray prior to leaving today. I will notify you of your results once received.   You will receive a phone call regarding the blood flow studies and for the referral to vascular surgery.  It was a pleasure to see you today!

## 2023-07-04 ENCOUNTER — Telehealth: Payer: Self-pay

## 2023-07-04 DIAGNOSIS — R0989 Other specified symptoms and signs involving the circulatory and respiratory systems: Secondary | ICD-10-CM

## 2023-07-04 DIAGNOSIS — R600 Localized edema: Secondary | ICD-10-CM

## 2023-07-04 NOTE — ED Provider Notes (Signed)
 [place] [facility] AT [facility] Provider Note   CSN: [id number] Arrival date & time: 07/03/23  1919     History  Chief Complaint  Patient presents with   Wound Check    [name] is a 58 y.o. female.  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  This has been progressing over the past 6 years.  Patient reports an inability to ambulate during this period of time.  Patient has had a small dark-colored area to the tip of her toe since January which is now enlarging in size.  She was at the doctor's office today where she had x-rays performed and was advised to come to the ER to rule out an ischemic foot.  The patient states that she has no feeling from the mid thigh down and does not experiencing any discomfort.  No fevers or chills.  No specific injury or trauma.  She also has a dark area noted to the tip of her right great toe.       Home Medications Prior to Admission medications   Medication Sig Start Date End Date Taking? Authorizing Provider  acetaminophen (TYLENOL) 325 MG tablet Take 650 mg by mouth every 6 (six) hours as needed.    [provider]  albuterol (PROVENTIL) (2.5 MG/3ML) 0.083% nebulizer solution Take 3 mLs (2.5 mg total) by nebulization every 6 (six) hours as needed for wheezing or shortness of breath. 02/18/23   Doreene Nest, NP  albuterol (VENTOLIN HFA) 108 (90 Base) MCG/ACT inhaler Inhale 1-2 puffs into the lungs every 6 (six) hours as needed for wheezing or shortness of breath. 01/12/22   Joaquim Nam, MD  atorvastatin (LIPITOR) 40 MG tablet TAKE ONE TABLET BY MOUTH ONCE A DAY FOR CHOLESTEROL. 04/09/23   Doreene Nest, NP  Bacillus Coagulans-Inulin (PROBIOTIC-PREBIOTIC PO) Take by mouth.    [provider]  budesonide (ENTOCORT EC) 3 MG 24 hr capsule Take 6 mg by mouth daily. 09/28/22   [provider]   budesonide-formoterol (SYMBICORT) 80-4.5 MCG/ACT inhaler Inhale 2 puffs into the lungs 2 (two) times daily. Rinse after use. 06/12/23   Joaquim Nam, MD  colestipol (COLESTID) 1 g tablet Take 1 g by mouth 2 (two) times daily. Patient takes 2 tablets BID 03/28/23   [provider]  Continuous Blood Gluc Receiver (DEXCOM G7 RECEIVER) DEVI by Does not apply route.    [provider]  Continuous Blood Gluc Sensor (DEXCOM G7 SENSOR) MISC by Does not apply route.    [provider]  cyclobenzaprine (FLEXERIL) 10 MG tablet Take 1 tablet (10 mg total) by mouth 3 (three) times daily as needed for muscle spasms. 08/21/22   Doreene Nest, NP  doxycycline (VIBRA-TABS) 100 MG tablet Take 1 tablet (100 mg total) by mouth 2 (two) times daily. Patient not taking: Reported on 07/03/2023 06/08/23   Margaretann Loveless, PA-C  Dulaglutide 4.5 MG/0.5ML SOPN Inject into the skin. Patient not taking: Reported on 07/03/2023 05/18/20   [provider]  EPINEPHrine (EPIPEN 2-PAK) 0.3 mg/0.3 mL IJ SOAJ injection Inject 0.3 mg into the muscle as needed for anaphylaxis. 05/14/22   Zigmund Daniel., MD  ergocalciferol (VITAMIN D2) 1.25 MG (50000 UT) capsule Take by mouth. 11/14/18   [provider]  fexofenadine (ALLEGRA) 180 MG tablet 1 tablet    [provider]  FLUoxetine (PROZAC) 40  MG capsule TAKE ONE CAPSULE BY MOUTH ONCE DAILY FOR ANXIETY AND DEPRESSION 04/16/23   Doreene Nest, NP  fluticasone [facility]) 50 MCG/ACT nasal spray Place 1 spray into both nostrils 2 (two) times daily. 05/25/20   Doreene Nest, NP  gabapentin (NEURONTIN) 600 MG tablet Take 1 tablet (600 mg total) by mouth 2 (two) times daily. For pain 08/21/22   Doreene Nest, NP  hydrochlorothiazide (HYDRODIURIL) 12.5 MG tablet TAKE ONE TABLET BY MOUTH ONCE DAILY FOR BLOOD PRESSURE 03/26/23   Doreene Nest, NP  insulin aspart (NOVOLOG FLEXPEN) 100 UNIT/ML FlexPen Inject 18-22 Units into  the skin 3 (three) times daily with meals. 06/12/23   Joaquim Nam, MD  insulin degludec (TRESIBA) 100 UNIT/ML FlexTouch Pen Inject 30 Units into the skin 2 (two) times daily. 06/12/23   Joaquim Nam, MD  Insulin Pen Needle 32G X 4 MM MISC Use 1 needle with pen as directed 09/20/19   Doreene Nest, NP  ketoconazole (NIZORAL) 2 % cream Apply 1 Application topically daily as needed for irritation. 11/07/22   Doreene Nest, NP  levocetirizine (XYZAL) 5 MG tablet TAKE ONE TABLET BY MOUTH EVERY EVENING 07/16/17   Doreene Nest, NP  losartan (COZAAR) 50 MG tablet TAKE ONE TABLET BY MOUTH ONCE DAILY FOR BLOOD PRESSURE 03/16/23   Doreene Nest, NP  metoprolol tartrate (LOPRESSOR) 25 MG tablet Take 0.5 tablets (12.5 mg total) by mouth 2 (two) times daily. for blood pressure. 08/21/22   Doreene Nest, NP  MOUNJARO 5 MG/0.5ML Pen Inject 5 mg into the skin once a week. 06/11/23   [provider]  Multiple Vitamin (MULTIVITAMIN PO) Take 1 tablet by mouth daily.     [provider]  Nebulizer MISC Dispense 1 nebulizer for home use, with tubing and supplies. J45.909. 01/12/22   Joaquim Nam, MD  ondansetron (ZOFRAN-ODT) 4 MG disintegrating tablet TAKE 1 TABLET BY MOUTH EVERY 8 HOURS AS NEEDED FOR NAUSEA AS DIRECTED. 01/26/23   Doreene Nest, NP  VALERIAN ROOT PO Take 1,000 mg by mouth at bedtime.    [provider]      Allergies    Amoxicillin, Bee venom, Levofloxacin, Meloxicam, Vancomycin, Ciprofloxacin, Sulfa antibiotics, Ace inhibitors, Codeine, Meperidine hcl, Tyloxapol, and Tequin [gatifloxacin]    Review of Systems   Review of Systems  All other systems reviewed and are negative.   Physical Exam Updated Vital Signs BP (!) 133/56   Pulse 82   Temp 97.9 F (36.6 C) (Oral)   Resp (!) 21   Ht 5\' 6"  (1.676 m)   Wt (!) 150 kg   SpO2 100%   BMI 53.37 kg/m  Physical Exam Vitals and nursing note reviewed.  Constitutional:      General:  She is not in acute distress.    Appearance: She is well-developed. She is not diaphoretic.  HENT:     Head: Normocephalic and atraumatic.  Cardiovascular:     Rate and Rhythm: Normal rate and regular rhythm.     Heart sounds: No murmur heard.    No friction rub. No gallop.  Pulmonary:     Effort: Pulmonary effort is normal. No respiratory distress.     Breath sounds: Normal breath sounds. No wheezing.  Abdominal:     General: Bowel sounds are normal. There is no distension.     Palpations: Abdomen is soft.     Tenderness: There is no abdominal tenderness.  Musculoskeletal:  General: Normal range of motion.     Cervical back: Normal range of motion and neck supple.     Comments: The right great toe has what appears to be a blood blister to the tip of the toe.  There is no warmth or erythema of the foot.  The foot is not cold to the touch.  Skin:    General: Skin is warm and dry.  Neurological:     General: No focal deficit present.     Mental Status: She is alert and oriented to person, place, and time.     ED Results / Procedures / Treatments   Labs (all labs ordered are listed, but only abnormal results are displayed) Labs Reviewed - No data to display  EKG None  Radiology DG Foot Complete Right Result Date: 07/03/2023 CLINICAL DATA:  Decreased pedal pulses.  Slow healing wounds. EXAM: RIGHT FOOT COMPLETE - 3+ VIEW COMPARISON:  None Available. FINDINGS: Examination is limited secondary to patient positioning. There is diffuse soft tissue swelling of the foot, particularly over the dorsum of the foot. Peripheral vascular calcifications are present. There is no radiopaque foreign body. There is no acute fracture or dislocation identified. There are mild degenerative changes at the first metatarsophalangeal joint. Questionable erosive changes are seen overlying the dorsal aspect of the base of the metatarsals on the lateral view. Small plantar and posterior calcaneal spurs  are present. IMPRESSION: 1. Diffuse soft tissue swelling of the foot, particularly over the dorsum of the foot. 2. Questionable erosive changes overlying the dorsal aspect of the base of the metatarsals on the lateral view. Consider further evaluation with MRI. Electronically Signed   By: Tyron Gallon M.D.   On: 07/03/2023 15:59    Procedures Procedures    Medications Ordered in ED Medications - No data to display  ED Course/ Medical Decision Making/ A&P  Patient is a 58 year old female with a progressive neurologic disorder presenting with concerns of discoloration to her right great toe and difficulty palpating pulses at the doctor's office.  Patient arrives here with stable vital signs and is afebrile.  Examination of the foot reveals what appears to be a blood blister to the tip of the toe.  There is significant edema of both feet.  I am unable to palpate a pulse, but I am easily able to Doppler a DP pulse in the right foot.  Laboratory studies performed earlier today including CBC, CMP, and BNP, all of which are unremarkable.  X-ray from earlier today reviewed showing erosive changes overlying the dorsal aspect of the base of the metatarsals for which an MRI was recommended.  At this point, this does not appear to be an ischemic limb and I do not feel requires admission or stat transfer.  I will make arrangements for an outpatient MRI of the foot and have patient follow-up with vascular surgery and vascular studies as arranged by her primary doctor this afternoon.  Final Clinical Impression(s) / ED Diagnoses Final diagnoses:  None    Rx / DC Orders ED Discharge Orders     None         Orvilla Blander, MD 07/04/23 [phone]

## 2023-07-04 NOTE — Telephone Encounter (Signed)
 Copied from CRM [id number]. Topic: Clinical - Medical Advice >> Jul 04, 2023  9:17 AM Earnestine Goes B wrote: Reason for CRM: pt called to speak with provider states she went hospital as discussed, she sat for 6 hours, nothing was done. None of the test provider request. Please call pt back at [phone] >> Jul 04, 2023 10:09 AM CMA Kaylyn C wrote: Wrong office

## 2023-07-04 NOTE — ED Notes (Signed)
 Discharge instruction given. Questions answered satisfactorily. VSS. Left on a mobile wheelchair with significant others.

## 2023-07-04 NOTE — Telephone Encounter (Signed)
 See my chart message

## 2023-07-04 NOTE — Discharge Instructions (Addendum)
 Radiology should call you to make arrangements for the MRI on your foot.  Follow-up with vascular surgery and vascular studies as previously recommended.

## 2023-07-05 ENCOUNTER — Ambulatory Visit
Admission: RE | Admit: 2023-07-05 | Discharge: 2023-07-05 | Disposition: A | Discharge status: Home / Self Care | Source: Ambulatory Visit | Attending: Primary Care | Admitting: Primary Care

## 2023-07-05 ENCOUNTER — Encounter: Payer: Self-pay | Admitting: *Deleted

## 2023-07-05 DIAGNOSIS — E785 Hyperlipidemia, unspecified: Secondary | ICD-10-CM | POA: Diagnosis not present

## 2023-07-05 DIAGNOSIS — I10 Essential (primary) hypertension: Secondary | ICD-10-CM | POA: Diagnosis not present

## 2023-07-05 DIAGNOSIS — R0989 Other specified symptoms and signs involving the circulatory and respiratory systems: Secondary | ICD-10-CM | POA: Insufficient documentation

## 2023-07-05 DIAGNOSIS — S91101A Unspecified open wound of right great toe without damage to nail, initial encounter: Secondary | ICD-10-CM | POA: Diagnosis not present

## 2023-07-05 DIAGNOSIS — E1151 Type 2 diabetes mellitus with diabetic peripheral angiopathy without gangrene: Secondary | ICD-10-CM | POA: Diagnosis not present

## 2023-07-07 ENCOUNTER — Ambulatory Visit (HOSPITAL_COMMUNITY)
Admission: RE | Admit: 2023-07-07 | Discharge: 2023-07-07 | Disposition: A | Discharge status: Home / Self Care | Source: Ambulatory Visit | Attending: Primary Care | Admitting: Primary Care

## 2023-07-07 DIAGNOSIS — R600 Localized edema: Secondary | ICD-10-CM | POA: Diagnosis not present

## 2023-07-07 DIAGNOSIS — R0989 Other specified symptoms and signs involving the circulatory and respiratory systems: Secondary | ICD-10-CM | POA: Insufficient documentation

## 2023-07-07 DIAGNOSIS — M79671 Pain in right foot: Secondary | ICD-10-CM | POA: Diagnosis not present

## 2023-07-07 MED ORDER — GADOBUTROL 1 MMOL/ML IV SOLN
10.0000 mL | Freq: Once | INTRAVENOUS | Status: AC | PRN
  Administered 2023-07-07: 10 mL via INTRAVENOUS

## 2023-07-09 ENCOUNTER — Other Ambulatory Visit (INDEPENDENT_AMBULATORY_CARE_PROVIDER_SITE_OTHER): Payer: Self-pay | Admitting: Nurse Practitioner

## 2023-07-09 DIAGNOSIS — R209 Unspecified disturbances of skin sensation: Secondary | ICD-10-CM

## 2023-07-09 DIAGNOSIS — R0989 Other specified symptoms and signs involving the circulatory and respiratory systems: Secondary | ICD-10-CM

## 2023-07-10 ENCOUNTER — Ambulatory Visit (INDEPENDENT_AMBULATORY_CARE_PROVIDER_SITE_OTHER): Admitting: Nurse Practitioner

## 2023-07-10 ENCOUNTER — Encounter (INDEPENDENT_AMBULATORY_CARE_PROVIDER_SITE_OTHER): Payer: Self-pay | Admitting: Nurse Practitioner

## 2023-07-10 ENCOUNTER — Ambulatory Visit (INDEPENDENT_AMBULATORY_CARE_PROVIDER_SITE_OTHER)

## 2023-07-10 VITALS — BP 148/70 | HR 74 | Resp 18

## 2023-07-10 DIAGNOSIS — Z794 Long term (current) use of insulin: Secondary | ICD-10-CM

## 2023-07-10 DIAGNOSIS — I10 Essential (primary) hypertension: Secondary | ICD-10-CM | POA: Diagnosis not present

## 2023-07-10 DIAGNOSIS — R209 Unspecified disturbances of skin sensation: Secondary | ICD-10-CM

## 2023-07-10 DIAGNOSIS — E1140 Type 2 diabetes mellitus with diabetic neuropathy, unspecified: Secondary | ICD-10-CM

## 2023-07-10 DIAGNOSIS — M7989 Other specified soft tissue disorders: Secondary | ICD-10-CM

## 2023-07-10 DIAGNOSIS — R0989 Other specified symptoms and signs involving the circulatory and respiratory systems: Secondary | ICD-10-CM

## 2023-07-10 NOTE — Progress Notes (Signed)
 Subjective:    Patient ID: [name], female    DOB: 14-Jun-1965, 58 y.o.   MRN: [id number] Chief Complaint  Patient presents with   New Patient (Initial Visit)    Ref [place] [facility] consult decreased pedal pulses,cold feet    The patient is a 58 year old female who presents today as a referral from her primary care provider [facility] NP in regards to cold feet with decreased pulses.  The patient has a small wound that appeared as a blister on her right great toe.  She notes that she has an upcoming evaluation with wound care next week.  She had additional concern for her blood flow given her father's history with peripheral arterial disease.  She had decreased pedal pulses and with the stated wound there was additional concern for wound healing.  Today the patient has an ABI of 1.12 on the right and 1.20 on the left.  She has triphasic waveforms on the right with multiphasic on the left.  She has normal toe waveforms bilaterally.   Review of Systems  Cardiovascular:  Positive for leg swelling.  Skin:  Positive for wound.  Neurological:  Positive for weakness.  All other systems reviewed and are negative.      Objective:   Physical Exam Vitals reviewed.  HENT:     Head: Normocephalic.  Cardiovascular:     Rate and Rhythm: Normal rate.  Pulmonary:     Effort: Pulmonary effort is normal.  Musculoskeletal:     Right lower leg: 2+ Pitting Edema present.     Left lower leg: 2+ Pitting Edema present.  Skin:    General: Skin is warm and dry.  Neurological:     Mental Status: She is alert and oriented to person, place, and time.  Psychiatric:        Mood and Affect: Mood normal.        Behavior: Behavior normal.        Thought Content: Thought content normal.        Judgment: Judgment normal.     BP (!) 148/70   Pulse 74   Resp 18   Past Medical History:  Diagnosis Date   AKI (acute kidney injury) (HCC) 05/19/2022   Allergy    Amyotrophic lateral sclerosis (ALS) (HCC)  09/23/2018   Anemia    Anxiety    Arthritis    Asthma    Bacteremia 05/09/2022   Cataract    CHF (congestive heart failure) (HCC)    Colon polyp    Complication of anesthesia    Cystitis with hematuria 10/24/2021   Diabetes mellitus    Displaced fracture of proximal end of right fibula 07/21/2017   Edema    Fatigue    Hypertension    IBS (irritable bowel syndrome)    Localized swelling of left foot 04/25/2023   Lump in female breast    Methicillin resistant Staphylococcus aureus in conditions classified elsewhere and of unspecified site    Migraines    Morbid obesity (HCC)    Neuromuscular disorder (HCC)    Night sweats    Nonspecific abnormal results of thyroid  function study    Other B-complex deficiencies    Paresthesias    PONV (postoperative nausea and vomiting)    Pure hypercholesterolemia    Reflux    Sacral fracture (HCC)    Septic shock (HCC) 05/08/2022   Sinus problem    Sleep apnea    Sore on toe (HCC) 05/17/2021   Symptomatic states  associated with artificial menopause    Thyroid  disease    Type II or unspecified type diabetes mellitus without mention of complication, not stated as uncontrolled    Unspecified sleep apnea     Social History   Socioeconomic History   Marital status: Married    Spouse name: Not on file   Number of children: 3   Years of education: college   Highest education level: Associate degree: academic program  Occupational History   Occupation: Animal nutritionist: [employer]  Tobacco Use   Smoking status: Never   Smokeless tobacco: Never  Vaping Use   Vaping status: Never Used  Substance and Sexual Activity   Alcohol use: Yes    Comment: Socially   Drug use: No   Sexual activity: Not Currently    Birth control/protection: None    Comment: Hysterectomy  Other Topics Concern   Not on file  Social History Narrative    Married x 29 years, happily married; no abuse.    Children: 3 children; 1 grandchild; two  step grandchildren; 1 gg.   Lives: with husband, mother-in-law.   Employment:  [employer] x 1996; Advice worker.  Loves work.   Tobacco: never   Alcohol: socially.  One glass of wine per month.    Seatbelt: 100%   Guns: gun safe in garage.   Social Drivers of Corporate investment banker Strain: Low Risk  (04/25/2023)   Overall Financial Resource Strain (CARDIA)    Difficulty of Paying Living Expenses: Not very hard  Food Insecurity: Low Risk  (06/11/2023)   Received from [facility]   Hunger Vital Sign    Worried About Running Out of Food in the Last Year: Never true    Ran Out of Food in the Last Year: Never true  Transportation Needs: No Transportation Needs (06/11/2023)   Received from [facility]    In the past 12 months, has lack of reliable transportation kept you from medical appointments, meetings, work or from getting things needed for daily living? : No  Physical Activity: Inactive (04/25/2023)   Exercise Vital Sign    Days of Exercise per Week: 0 days    Minutes of Exercise per Session: 0 min  Stress: No Stress Concern Present (04/25/2023)   [employer] of Occupational Health - Occupational Stress Questionnaire    Feeling of Stress : Not at all  Social Connections: Socially Integrated (04/25/2023)   Social Connection and Isolation Panel [NHANES]    Frequency of Communication with Friends and Family: More than three times a week    Frequency of Social Gatherings with Friends and Family: Once a week    Attends Religious Services: 1 to 4 times per year    Active Member of [employer] or Organizations: No    Attends Banker Meetings: 1 to 4 times per year    Marital Status: Married  Catering manager Violence: Not At Risk (10/03/2022)   Humiliation, Afraid, Rape, and Kick questionnaire    Fear of Current or Ex-Partner: No    Emotionally Abused: No    Physically Abused: No    Sexually Abused: No    Past Surgical History:  Procedure Laterality Date    abcess removal  03/21/1995   MRSA   ABDOMINAL HYSTERECTOMY  03/21/1995   complete in 1997, partial was in 1995   carpal tunnel release r  07/03/2013   Dalldorf   CATARACT EXTRACTION W/PHACO Left 08/10/2022  Procedure: CATARACT EXTRACTION PHACO AND INTRAOCULAR LENS PLACEMENT (IOC) LEFT DIABETIC  ASPIRE ENVISTA  LENS  4.39  00:36.5;  Surgeon: Trudi Fus, MD;  Location: [facility];  Service: Ophthalmology;  Laterality: Left;   CATARACT EXTRACTION W/PHACO Right 08/24/2022   Procedure: CATARACT EXTRACTION PHACO AND INTRAOCULAR LENS PLACEMENT (IOC) RIGHT DIABETIC  ASPIRE ENVISTA LENS  5.09  00:31.3;  Surgeon: Trudi Fus, MD;  Location: [facility];  Service: Ophthalmology;  Laterality: Right;   CESAREAN SECTION  1987  and 1989   CHOLECYSTECTOMY  03/20/1989   COLONOSCOPY  03/21/2011   Normal.  [facility].   KNEE SURGERY  03/20/2004   LAPAROSCOPIC GASTRIC BANDING  05/18/2009   SPINE SURGERY     TONSILLECTOMY AND ADENOIDECTOMY  03/20/1972   TUBAL LIGATION     ULNAR TUNNEL RELEASE Right 02/05/2018   Procedure: CUBITAL TUNNEL RELEASE/DECOMPRESSION;  Surgeon: Dayne Even, MD;  Location: [facility];  Service: Orthopedics;  Laterality: Right;    Family History  Problem Relation Age of Onset   Diabetes Mother    Heart disease Mother 64       CHF, CAD   Other Mother        muscle disease   Hypertension Mother    Hyperlipidemia Mother    Arthritis Mother        OA hip L s/p THR   Leukemia Mother 5   Kidney disease Mother    Diabetes Father    Heart disease Father        AMI/CABG/valve replacement age 35.   Stroke Father    Diabetes Brother    Heart disease Brother        stent age 22.   Hypertension Brother    Obesity Brother    Stroke Brother    Cancer Paternal Aunt    Varicose Veins Paternal Aunt    Alzheimer's disease Maternal Grandmother    Emphysema Maternal Grandfather    Heart disease Paternal Grandmother    Fibromyalgia Daughter     Crohn's disease Daughter    Asthma Daughter    Obesity Daughter    Asthma Daughter    Hypertension Son    Breast cancer Other        2 Aunts    Allergies  Allergen Reactions   Amoxicillin Rash, Shortness Of Breath and Other (See Comments)    Patient states she was previously able to tolerate Keflex without issue   Bee Venom Anaphylaxis   Levofloxacin Rash, Shortness Of Breath and Other (See Comments)   Meloxicam  Hives, Rash and Other (See Comments)   Vancomycin Anaphylaxis and Other (See Comments)   Ciprofloxacin  Hives, Itching, Swelling and Other (See Comments)   Sulfa  Antibiotics Rash and Other (See Comments)   Ace Inhibitors Cough and Other (See Comments)   Codeine Other (See Comments)   Meperidine Hcl Other (See Comments)   Tyloxapol     Other reaction(s): vomiting/rash   Tequin [Gatifloxacin] Rash       Latest Ref Rng & Units 07/03/2023    1:32 PM 04/25/2023    2:42 PM 06/14/2022    1:44 PM  CBC  WBC 4.0 - 10.5 K/uL 8.1  8.7  9.6   Hemoglobin 12.0 - 15.0 g/dL 16.1  09.6  04.5   Hematocrit 36.0 - 46.0 % 42.4  44.0  42.2   Platelets 150.0 - 400.0 K/uL 277.0  324.0  238       CMP     Component Value Date/Time  NA 143 07/03/2023 1332   K 4.3 07/03/2023 1332   CL 102 07/03/2023 1332   CO2 31 07/03/2023 1332   GLUCOSE 188 (H) 07/03/2023 1332   BUN 27 (H) 07/03/2023 1332   CREATININE 1.05 07/03/2023 1332   CREATININE 1.07 (H) 05/19/2022 1554   CALCIUM  9.1 07/03/2023 1332   PROT 6.1 07/03/2023 1332   ALBUMIN 3.7 07/03/2023 1332   AST 23 07/03/2023 1332   ALT 37 (H) 07/03/2023 1332   ALKPHOS 54 07/03/2023 1332   BILITOT 0.4 07/03/2023 1332   GFR 58.35 (L) 07/03/2023 1332   GFRNONAA 57 (L) 06/14/2022 1344   GFRNONAA >89 01/12/2014 1341     No results found.     Assessment & Plan:   1. Decreased pedal pulses (Primary) Today noninvasive studies show no significant arterial disease.  The patient should have adequate ability for wound healing.  She does have  an upcoming evaluation with wound care and I believe that with wound care she should not have any issues with actually healing.  2. Essential hypertension Continue antihypertensive medications as already ordered, these medications have been reviewed and there are no changes at this time.  3. Type 2 diabetes mellitus with diabetic neuropathy, with long-term current use of insulin  (HCC) Continue hypoglycemic medications as already ordered, these medications have been reviewed and there are no changes at this time.  Hgb A1C to be monitored as already arranged by primary service  4. Leg swelling I had a long discussion with the patient regarding lower extremity edema and its causes.  I suspect that her edema are likely multifactorial.  We discussed first-line conservative therapy treatment which includes medical grade compression socks, elevation and activity.  The patient will not be able to be very active but compression and elevation are certainly possible for her.  We also discussed the use of compression wraps and I think these will work better for her.  She is advised to utilize the compression throughout the day specifically advised not to sleep in these.  She should also elevate her lower extremities when not active.  Will have her return for venous reflux studies to evaluate venous insufficiency.  We may also discussed lymphedema pump   Current Outpatient Medications on File Prior to Visit  Medication Sig Dispense Refill   acetaminophen  (TYLENOL ) 325 MG tablet Take 650 mg by mouth every 6 (six) hours as needed.     albuterol  (PROVENTIL ) (2.5 MG/3ML) 0.083% nebulizer solution Take 3 mLs (2.5 mg total) by nebulization every 6 (six) hours as needed for wheezing or shortness of breath. 75 mL 0   albuterol  (VENTOLIN  HFA) 108 (90 Base) MCG/ACT inhaler Inhale 1-2 puffs into the lungs every 6 (six) hours as needed for wheezing or shortness of breath. 18 g 1   atorvastatin  (LIPITOR) 40 MG tablet TAKE  ONE TABLET BY MOUTH ONCE A DAY FOR CHOLESTEROL. 90 tablet 2   Bacillus Coagulans-Inulin (PROBIOTIC-PREBIOTIC PO) Take by mouth.     budesonide  (ENTOCORT EC ) 3 MG 24 hr capsule Take 6 mg by mouth daily.     budesonide -formoterol  (SYMBICORT ) 80-4.5 MCG/ACT inhaler Inhale 2 puffs into the lungs 2 (two) times daily. Rinse after use. 1 each 3   colestipol (COLESTID) 1 g tablet Take 1 g by mouth 2 (two) times daily. Patient takes 2 tablets BID     Continuous Blood Gluc Receiver (DEXCOM G7 RECEIVER) DEVI by Does not apply route.     Continuous Blood Gluc Sensor (DEXCOM G7 SENSOR) MISC  by Does not apply route.     cyclobenzaprine  (FLEXERIL ) 10 MG tablet Take 1 tablet (10 mg total) by mouth 3 (three) times daily as needed for muscle spasms. 270 tablet 2   EPINEPHrine  (EPIPEN  2-PAK) 0.3 mg/0.3 mL IJ SOAJ injection Inject 0.3 mg into the muscle as needed for anaphylaxis. 1 each 1   ergocalciferol  (VITAMIN D2) 1.25 MG (50000 UT) capsule Take by mouth.     fexofenadine (ALLEGRA) 180 MG tablet 1 tablet     FLUoxetine  (PROZAC ) 40 MG capsule TAKE ONE CAPSULE BY MOUTH ONCE DAILY FOR ANXIETY AND DEPRESSION 90 capsule 2   fluticasone  (FLONASE ) 50 MCG/ACT nasal spray Place 1 spray into both nostrils 2 (two) times daily. 48 mL 1   gabapentin  (NEURONTIN ) 600 MG tablet Take 1 tablet (600 mg total) by mouth 2 (two) times daily. For pain 180 tablet 2   hydrochlorothiazide  (HYDRODIURIL ) 12.5 MG tablet TAKE ONE TABLET BY MOUTH ONCE DAILY FOR BLOOD PRESSURE 90 tablet 2   insulin  aspart (NOVOLOG  FLEXPEN) 100 UNIT/ML FlexPen Inject 18-22 Units into the skin 3 (three) times daily with meals.     insulin  degludec (TRESIBA ) 100 UNIT/ML FlexTouch Pen Inject 30 Units into the skin 2 (two) times daily.     Insulin  Pen Needle 32G X 4 MM MISC Use 1 needle with pen as directed 100 each 11   ketoconazole  (NIZORAL ) 2 % cream Apply 1 Application topically daily as needed for irritation. 30 g 0   levocetirizine (XYZAL ) 5 MG tablet TAKE ONE  TABLET BY MOUTH EVERY EVENING 90 tablet 1   losartan  (COZAAR ) 50 MG tablet TAKE ONE TABLET BY MOUTH ONCE DAILY FOR BLOOD PRESSURE 90 tablet 2   metoprolol  tartrate (LOPRESSOR ) 25 MG tablet Take 0.5 tablets (12.5 mg total) by mouth 2 (two) times daily. for blood pressure. 90 tablet 2   MOUNJARO 5 MG/0.5ML Pen Inject 5 mg into the skin once a week.     Multiple Vitamin (MULTIVITAMIN PO) Take 1 tablet by mouth daily.      Nebulizer MISC Dispense 1 nebulizer for home use, with tubing and supplies. J45.909. 1 each 0   ondansetron  (ZOFRAN -ODT) 4 MG disintegrating tablet TAKE 1 TABLET BY MOUTH EVERY 8 HOURS AS NEEDED FOR NAUSEA AS DIRECTED. 20 tablet 0   VALERIAN ROOT PO Take 1,000 mg by mouth at bedtime.     doxycycline  (VIBRA -TABS) 100 MG tablet Take 1 tablet (100 mg total) by mouth 2 (two) times daily. (Patient not taking: Reported on 07/10/2023) 20 tablet 0   Dulaglutide  4.5 MG/0.5ML SOPN Inject into the skin. (Patient not taking: Reported on 07/03/2023)     Current Facility-Administered Medications on File Prior to Visit  Medication Dose Route Frequency Provider Last Rate Last Admin   silver  sulfADIAZINE  (SILVADENE ) 1 % cream 1 Application  1 Application Topical Once Cable, James M, NP        There are no Patient Instructions on file for this visit. No follow-ups on file.   Brycelyn Gambino E Sadat Sliwa, NP

## 2023-07-10 NOTE — Progress Notes (Incomplete)
 Subjective:    Patient ID: [name], female    DOB: [birth year], 58 y.o.   MRN: [id number] Chief Complaint  Patient presents with  . New Patient (Initial Visit)    Ref [place] [facility] consult decreased pedal pulses,cold feet    The patient is a 58 year old female who presents today as a referral from her primary care provider [facility] NP in regards to cold feet with decreased pulses.  The patient has a small wound that appeared as a blister on her right great toe.  She notes that she has an upcoming evaluation with wound care next week.  She had decreased pedal pulses and with the stated wound there was additional concern for wound healing.  Today the patient has an ABI of 1.12 on the right and 1.20 on the left.  She has triphasic waveforms on the right with multiphasic on the left.  She has normal toe waveforms bilaterally.    Review of Systems  Cardiovascular:  Positive for leg swelling.  Skin:  Positive for wound.  All other systems reviewed and are negative.      Objective:   Physical Exam  BP (!) 148/70   Pulse 74   Resp 18   Past Medical History:  Diagnosis Date  . AKI (acute kidney injury) (HCC) 05/19/2022  . Allergy   . 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  . Anemia   . Anxiety   . Arthritis   . Asthma   . Bacteremia 05/09/2022  . Cataract   . CHF (congestive heart failure) (HCC)   . Colon polyp   . Complication of anesthesia   . Cystitis with hematuria 10/24/2021  . Diabetes mellitus   . Displaced fracture of proximal end of right fibula 07/21/2017  . Edema   . Fatigue   . Hypertension   . IBS (irritable bowel syndrome)   . Localized swelling of left foot 04/25/2023  . Lump in female breast   . Methicillin resistant Staphylococcus aureus in conditions classified elsewhere and of unspecified site   . Migraines   . Morbid obesity (HCC)   . Neuromuscular disorder (HCC)   . Night sweats   . Nonspecific abnormal results of thyroid  function  study   . Other B-complex deficiencies   . Paresthesias   . PONV (postoperative nausea and vomiting)   . Pure hypercholesterolemia   . Reflux   . Sacral fracture (HCC)   . Septic shock (HCC) 05/08/2022  . Sinus problem   . Sleep apnea   . Sore on toe (HCC) 05/17/2021  . Symptomatic states associated with artificial menopause   . Thyroid  disease   . Type II or unspecified type diabetes mellitus without mention of complication, not stated as uncontrolled   . Unspecified sleep apnea     Social History   Socioeconomic History  . Marital status: Married    Spouse name: Not on file  . Number of children: 3  . Years of education: college  . Highest education level: Associate degree: academic program  Occupational History  . Occupation: Animal nutritionist: [employer]   Tobacco Use  . Smoking status: Never  . Smokeless tobacco: Never  Vaping Use  . Vaping status: Never Used  Substance and Sexual Activity  . Alcohol use: Yes    Comment: Socially  . Drug use: No  . Sexual activity: Not Currently    Birth control/protection: None    Comment: Hysterectomy  Other Topics Concern  .  Not on file  Social History Narrative    Married x 29 years, happily married; no abuse.    Children: 3 children; 1 grandchild; two step grandchildren; 1 gg.   Lives: with husband, mother-in-law.   Employment:  [employer] x 1996; Advice worker.  Loves work.   Tobacco: never   Alcohol: socially.  One glass of wine per month.    Seatbelt: 100%   Guns: gun safe in garage.   Social Drivers of Corporate investment banker Strain: Low Risk  (04/25/2023)   Overall Financial Resource Strain (CARDIA)   . Difficulty of Paying Living Expenses: Not very hard  Food Insecurity: Low Risk  (06/11/2023)   Received from [facility]   Hunger Vital Sign   . Worried About Programme researcher, broadcasting/film/video in the Last Year: Never true   . Ran Out of Food in the Last Year: Never true  Transportation Needs: No Transportation Needs  (06/11/2023)   Received from [facility]   . In the past 12 months, has lack of reliable transportation kept you from medical appointments, meetings, work or from getting things needed for daily living? : No  Physical Activity: Inactive (04/25/2023)   Exercise Vital Sign   . Days of Exercise per Week: 0 days   . Minutes of Exercise per Session: 0 min  Stress: No Stress Concern Present (04/25/2023)   [employer] of Occupational Health - Occupational Stress Questionnaire   . Feeling of Stress : Not at all  Social Connections: Socially Integrated (04/25/2023)   Social Connection and Isolation Panel [NHANES]   . Frequency of Communication with Friends and Family: More than three times a week   . Frequency of Social Gatherings with Friends and Family: Once a week   . Attends Religious Services: 1 to 4 times per year   . Active Member of Clubs or Organizations: No   . Attends Banker Meetings: 1 to 4 times per year   . Marital Status: Married  Catering manager Violence: Not At Risk (10/03/2022)   Humiliation, Afraid, Rape, and Kick questionnaire   . Fear of Current or Ex-Partner: No   . Emotionally Abused: No   . Physically Abused: No   . Sexually Abused: No    Past Surgical History:  Procedure Laterality Date  . abcess removal  03/21/1995   MRSA  . ABDOMINAL HYSTERECTOMY  03/21/1995   complete in 1997, partial was in 1995  . carpal tunnel release r  07/03/2013   [place]  . CATARACT EXTRACTION W/PHACO Left 08/10/2022   Procedure: CATARACT EXTRACTION PHACO AND INTRAOCULAR LENS PLACEMENT (IOC) LEFT DIABETIC  ASPIRE ENVISTA  LENS  4.39  00:36.5;  Surgeon: Trudi Fus, MD;  Location: [facility];  Service: Ophthalmology;  Laterality: Left;  . CATARACT EXTRACTION W/PHACO Right 08/24/2022   Procedure: CATARACT EXTRACTION PHACO AND INTRAOCULAR LENS PLACEMENT (IOC) RIGHT DIABETIC  ASPIRE ENVISTA LENS  5.09  00:31.3;  Surgeon: Trudi Fus, MD;  Location: [facility];  Service: Ophthalmology;  Laterality: Right;  . CESAREAN SECTION  1987  and 1989  . CHOLECYSTECTOMY  03/20/1989  . COLONOSCOPY  03/21/2011   Normal.  [facility].  Aaron Aas KNEE SURGERY  03/20/2004  . LAPAROSCOPIC GASTRIC BANDING  05/18/2009  . SPINE SURGERY    . TONSILLECTOMY AND ADENOIDECTOMY  03/20/1972  . TUBAL LIGATION    . ULNAR TUNNEL RELEASE Right 02/05/2018   Procedure: CUBITAL TUNNEL RELEASE/DECOMPRESSION;  Surgeon: Dayne Even, MD;  Location: [facility] OR;  Service: Orthopedics;  Laterality: Right;    Family History  Problem Relation Age of Onset  . Diabetes Mother   . Heart disease Mother 70       CHF, CAD  . Other Mother        muscle disease  . Hypertension Mother   . Hyperlipidemia Mother   . Arthritis Mother        OA hip L s/p THR  . Leukemia Mother 41  . Kidney disease Mother   . Diabetes Father   . Heart disease Father        AMI/CABG/valve replacement age 59.  . Stroke Father   . Diabetes Brother   . Heart disease Brother        stent age 34.  Aaron Aas Hypertension Brother   . Obesity Brother   . Stroke Brother   . Cancer Paternal Aunt   . Varicose Veins Paternal Aunt   . Alzheimer's disease Maternal Grandmother   . Emphysema Maternal Grandfather   . Heart disease Paternal Grandmother   . Fibromyalgia Daughter   . Crohn's disease Daughter   . Asthma Daughter   . Obesity Daughter   . Asthma Daughter   . Hypertension Son   . Breast cancer Other        2 Aunts    Allergies  Allergen Reactions  . Amoxicillin Rash, Shortness Of Breath and Other (See Comments)    Patient states she was previously able to tolerate Keflex without issue  . Bee Venom Anaphylaxis  . Levofloxacin Rash, Shortness Of Breath and Other (See Comments)  . Meloxicam  Hives, Rash and Other (See Comments)  . Vancomycin Anaphylaxis and Other (See Comments)  . Ciprofloxacin  Hives, Itching, Swelling and Other (See Comments)  . Sulfa  Antibiotics Rash  and Other (See Comments)  . Ace Inhibitors Cough and Other (See Comments)  . Codeine Other (See Comments)  . Meperidine Hcl Other (See Comments)  . Tyloxapol     Other reaction(s): vomiting/rash  . Tequin [Gatifloxacin] Rash       Latest Ref Rng & Units 07/03/2023    1:32 PM 04/25/2023    2:42 PM 06/14/2022    1:44 PM  CBC  WBC 4.0 - 10.5 K/uL 8.1  8.7  9.6   Hemoglobin 12.0 - 15.0 g/dL 16.1  09.6  04.5   Hematocrit 36.0 - 46.0 % 42.4  44.0  42.2   Platelets 150.0 - 400.0 K/uL 277.0  324.0  238       CMP     Component Value Date/Time   NA 143 07/03/2023 1332   K 4.3 07/03/2023 1332   CL 102 07/03/2023 1332   CO2 31 07/03/2023 1332   GLUCOSE 188 (H) 07/03/2023 1332   BUN 27 (H) 07/03/2023 1332   CREATININE 1.05 07/03/2023 1332   CREATININE 1.07 (H) 05/19/2022 1554   CALCIUM  9.1 07/03/2023 1332   PROT 6.1 07/03/2023 1332   ALBUMIN 3.7 07/03/2023 1332   AST 23 07/03/2023 1332   ALT 37 (H) 07/03/2023 1332   ALKPHOS 54 07/03/2023 1332   BILITOT 0.4 07/03/2023 1332   GFR 58.35 (L) 07/03/2023 1332   GFRNONAA 57 (L) 06/14/2022 1344   GFRNONAA >89 01/12/2014 1341     No results found.     Assessment & Plan:   1. Decreased pedal pulses (Primary) Today noninvasive studies show no significant arterial disease.  The patient should have adequate ability for wound healing.  She does have an  upcoming evaluation with wound care and I believe that with wound care she should not have any issues with actually healing.  2. Essential hypertension Continue antihypertensive medications as already ordered, these medications have been reviewed and there are no changes at this time.  3. Type 2 diabetes mellitus with diabetic neuropathy, with long-term current use of insulin  (HCC) Continue hypoglycemic medications as already ordered, these medications have been reviewed and there are no changes at this time.  Hgb A1C to be monitored as already arranged by primary service  4. Leg  swelling I had a long discussion with the patient regarding lower extremity edema and its causes.  I suspect that her edema are likely multifactorial.  We discussed first-line conservative therapy treatment which includes medical grade compression socks, elevation and activity.  The patient will not be able to be very active but compression and elevation are certainly possible for her.  We also discussed the use of compression wraps and I think these will work better for her.  She is advised to utilize the compression throughout the day specifically advised not to sleep in these.  She should also elevate her lower extremities when not active.  Will have her return for venous reflux studies to evaluate venous insufficiency.  We may also discussed lymphedema pump   Current Outpatient Medications on File Prior to Visit  Medication Sig Dispense Refill  . acetaminophen  (TYLENOL ) 325 MG tablet Take 650 mg by mouth every 6 (six) hours as needed.    . albuterol  (PROVENTIL ) (2.5 MG/3ML) 0.083% nebulizer solution Take 3 mLs (2.5 mg total) by nebulization every 6 (six) hours as needed for wheezing or shortness of breath. 75 mL 0  . albuterol  (VENTOLIN  HFA) 108 (90 Base) MCG/ACT inhaler Inhale 1-2 puffs into the lungs every 6 (six) hours as needed for wheezing or shortness of breath. 18 g 1  . atorvastatin  (LIPITOR) 40 MG tablet TAKE ONE TABLET BY MOUTH ONCE A DAY FOR CHOLESTEROL. 90 tablet 2  . Bacillus Coagulans-Inulin (PROBIOTIC-PREBIOTIC PO) Take by mouth.    . budesonide  (ENTOCORT EC ) 3 MG 24 hr capsule Take 6 mg by mouth daily.    . budesonide -formoterol  (SYMBICORT ) 80-4.5 MCG/ACT inhaler Inhale 2 puffs into the lungs 2 (two) times daily. Rinse after use. 1 each 3  . colestipol (COLESTID) 1 g tablet Take 1 g by mouth 2 (two) times daily. Patient takes 2 tablets BID    . Continuous Blood Gluc Receiver (DEXCOM G7 RECEIVER) DEVI by Does not apply route.    . Continuous Blood Gluc Sensor (DEXCOM G7 SENSOR) MISC  by Does not apply route.    . cyclobenzaprine  (FLEXERIL ) 10 MG tablet Take 1 tablet (10 mg total) by mouth 3 (three) times daily as needed for muscle spasms. 270 tablet 2  . EPINEPHrine  (EPIPEN  2-PAK) 0.3 mg/0.3 mL IJ SOAJ injection Inject 0.3 mg into the muscle as needed for anaphylaxis. 1 each 1  . ergocalciferol  (VITAMIN D2) 1.25 MG (50000 UT) capsule Take by mouth.    . fexofenadine (ALLEGRA) 180 MG tablet 1 tablet    . FLUoxetine  (PROZAC ) 40 MG capsule TAKE ONE CAPSULE BY MOUTH ONCE DAILY FOR ANXIETY AND DEPRESSION 90 capsule 2  . fluticasone  (FLONASE ) 50 MCG/ACT nasal spray Place 1 spray into both nostrils 2 (two) times daily. 48 mL 1  . gabapentin  (NEURONTIN ) 600 MG tablet Take 1 tablet (600 mg total) by mouth 2 (two) times daily. For pain 180 tablet 2  . hydrochlorothiazide  (HYDRODIURIL ) 12.5 MG  tablet TAKE ONE TABLET BY MOUTH ONCE DAILY FOR BLOOD PRESSURE 90 tablet 2  . insulin  aspart (NOVOLOG  FLEXPEN) 100 UNIT/ML FlexPen Inject 18-22 Units into the skin 3 (three) times daily with meals.    . insulin  degludec (TRESIBA ) 100 UNIT/ML FlexTouch Pen Inject 30 Units into the skin 2 (two) times daily.    . Insulin  Pen Needle 32G X 4 MM MISC Use 1 needle with pen as directed 100 each 11  . ketoconazole  (NIZORAL ) 2 % cream Apply 1 Application topically daily as needed for irritation. 30 g 0  . levocetirizine (XYZAL ) 5 MG tablet TAKE ONE TABLET BY MOUTH EVERY EVENING 90 tablet 1  . losartan  (COZAAR ) 50 MG tablet TAKE ONE TABLET BY MOUTH ONCE DAILY FOR BLOOD PRESSURE 90 tablet 2  . metoprolol  tartrate (LOPRESSOR ) 25 MG tablet Take 0.5 tablets (12.5 mg total) by mouth 2 (two) times daily. for blood pressure. 90 tablet 2  . MOUNJARO 5 MG/0.5ML Pen Inject 5 mg into the skin once a week.    . Multiple Vitamin (MULTIVITAMIN PO) Take 1 tablet by mouth daily.     . Nebulizer MISC Dispense 1 nebulizer for home use, with tubing and supplies. J45.909. 1 each 0  . ondansetron  (ZOFRAN -ODT) 4 MG disintegrating  tablet TAKE 1 TABLET BY MOUTH EVERY 8 HOURS AS NEEDED FOR NAUSEA AS DIRECTED. 20 tablet 0  . VALERIAN ROOT PO Take 1,000 mg by mouth at bedtime.    . doxycycline  (VIBRA -TABS) 100 MG tablet Take 1 tablet (100 mg total) by mouth 2 (two) times daily. (Patient not taking: Reported on 07/10/2023) 20 tablet 0  . Dulaglutide  4.5 MG/0.5ML SOPN Inject into the skin. (Patient not taking: Reported on 07/03/2023)     Current Facility-Administered Medications on File Prior to Visit  Medication Dose Route Frequency Provider Last Rate Last Admin  . silver  sulfADIAZINE  (SILVADENE ) 1 % cream 1 Application  1 Application Topical Once Dorothe Gaster, NP        There are no Patient Instructions on file for this visit. No follow-ups on file.   Kaeson Kleinert E Britteney Ayotte, NP

## 2023-07-16 ENCOUNTER — Encounter: Attending: Physician Assistant | Admitting: Physician Assistant

## 2023-07-16 DIAGNOSIS — E11621 Type 2 diabetes mellitus with foot ulcer: Secondary | ICD-10-CM | POA: Diagnosis not present

## 2023-07-16 DIAGNOSIS — L97512 Non-pressure chronic ulcer of other part of right foot with fat layer exposed: Secondary | ICD-10-CM | POA: Insufficient documentation

## 2023-07-16 DIAGNOSIS — E1143 Type 2 diabetes mellitus with diabetic autonomic (poly)neuropathy: Secondary | ICD-10-CM | POA: Diagnosis not present

## 2023-07-23 ENCOUNTER — Other Ambulatory Visit: Payer: Self-pay | Admitting: Primary Care

## 2023-07-23 ENCOUNTER — Encounter: Attending: Physician Assistant | Admitting: Physician Assistant

## 2023-07-23 DIAGNOSIS — R000 Tachycardia, unspecified: Secondary | ICD-10-CM

## 2023-07-23 DIAGNOSIS — I10 Essential (primary) hypertension: Secondary | ICD-10-CM

## 2023-07-23 DIAGNOSIS — E11621 Type 2 diabetes mellitus with foot ulcer: Secondary | ICD-10-CM | POA: Diagnosis not present

## 2023-07-23 DIAGNOSIS — E1143 Type 2 diabetes mellitus with diabetic autonomic (poly)neuropathy: Secondary | ICD-10-CM | POA: Diagnosis not present

## 2023-07-23 DIAGNOSIS — L97512 Non-pressure chronic ulcer of other part of right foot with fat layer exposed: Secondary | ICD-10-CM | POA: Insufficient documentation

## 2023-07-25 DIAGNOSIS — G4733 Obstructive sleep apnea (adult) (pediatric): Secondary | ICD-10-CM | POA: Diagnosis not present

## 2023-07-30 ENCOUNTER — Encounter: Admitting: Physician Assistant

## 2023-07-30 DIAGNOSIS — E11621 Type 2 diabetes mellitus with foot ulcer: Secondary | ICD-10-CM | POA: Diagnosis not present

## 2023-07-30 DIAGNOSIS — E1143 Type 2 diabetes mellitus with diabetic autonomic (poly)neuropathy: Secondary | ICD-10-CM | POA: Diagnosis not present

## 2023-07-30 DIAGNOSIS — L97512 Non-pressure chronic ulcer of other part of right foot with fat layer exposed: Secondary | ICD-10-CM | POA: Diagnosis not present

## 2023-08-02 ENCOUNTER — Other Ambulatory Visit (INDEPENDENT_AMBULATORY_CARE_PROVIDER_SITE_OTHER): Payer: Self-pay | Admitting: Nurse Practitioner

## 2023-08-02 DIAGNOSIS — M7989 Other specified soft tissue disorders: Secondary | ICD-10-CM

## 2023-08-03 ENCOUNTER — Ambulatory Visit (INDEPENDENT_AMBULATORY_CARE_PROVIDER_SITE_OTHER)

## 2023-08-03 ENCOUNTER — Encounter (INDEPENDENT_AMBULATORY_CARE_PROVIDER_SITE_OTHER): Payer: Self-pay | Admitting: Nurse Practitioner

## 2023-08-03 ENCOUNTER — Ambulatory Visit (INDEPENDENT_AMBULATORY_CARE_PROVIDER_SITE_OTHER): Admitting: Nurse Practitioner

## 2023-08-03 VITALS — BP 177/76 | HR 72 | Resp 18

## 2023-08-03 DIAGNOSIS — Z794 Long term (current) use of insulin: Secondary | ICD-10-CM

## 2023-08-03 DIAGNOSIS — M7989 Other specified soft tissue disorders: Secondary | ICD-10-CM | POA: Diagnosis not present

## 2023-08-03 DIAGNOSIS — R0989 Other specified symptoms and signs involving the circulatory and respiratory systems: Secondary | ICD-10-CM | POA: Diagnosis not present

## 2023-08-03 DIAGNOSIS — I10 Essential (primary) hypertension: Secondary | ICD-10-CM | POA: Diagnosis not present

## 2023-08-03 DIAGNOSIS — E1140 Type 2 diabetes mellitus with diabetic neuropathy, unspecified: Secondary | ICD-10-CM | POA: Diagnosis not present

## 2023-08-03 DIAGNOSIS — I890 Lymphedema, not elsewhere classified: Secondary | ICD-10-CM

## 2023-08-05 ENCOUNTER — Encounter (INDEPENDENT_AMBULATORY_CARE_PROVIDER_SITE_OTHER): Payer: Self-pay | Admitting: Nurse Practitioner

## 2023-08-05 NOTE — Progress Notes (Signed)
 Subjective:    Patient ID: [name], female    DOB: 1965/04/18, 58 y.o.   MRN: [id number] Chief Complaint  Patient presents with   Follow-up    Pt conv bilateral venous reflux     The patient is a 58 year old female who returns today for evaluation of swelling in her bilateral lower extremities.  She initially was sent due to a slow healing wound on her lower extremity but that has healed since her last visit.  Also since her last visit the patient has been utilizing medical grade compression wraps.  She also has the foot wraps and she has had a notable decrease in her swelling.  However the patient's swelling has continued despite conservative therapy including compression, elevation and activity.  She underwent noninvasive studies which shows no evidence of DVT or superficial phlebitis bilaterally.  She does have evidence of deep venous insufficiency in the right and superficial venous reflux noted bilaterally.     Review of Systems  Cardiovascular:  Positive for leg swelling.  Skin:  Positive for wound.  Neurological:  Positive for weakness.  All other systems reviewed and are negative.      Objective:   Physical Exam Vitals reviewed.  HENT:     Head: Normocephalic.  Cardiovascular:     Rate and Rhythm: Normal rate.  Pulmonary:     Effort: Pulmonary effort is normal.  Musculoskeletal:     Right lower leg: 2+ Pitting Edema present.     Left lower leg: 2+ Pitting Edema present.  Skin:    General: Skin is warm and dry.  Neurological:     Mental Status: She is alert and oriented to person, place, and time.  Psychiatric:        Mood and Affect: Mood normal.        Behavior: Behavior normal.        Thought Content: Thought content normal.        Judgment: Judgment normal.     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    Anemia    Anxiety    Arthritis    Asthma    Bacteremia 05/09/2022   Cataract    CHF (congestive heart failure) (HCC)    Colon polyp    Complication of anesthesia    Cystitis with hematuria 10/24/2021   Diabetes mellitus    Displaced fracture of proximal end of right fibula 07/21/2017   Edema    Fatigue    Hypertension    IBS (irritable bowel syndrome)    Localized swelling of left foot 04/25/2023   Lump in female breast    Methicillin resistant Staphylococcus aureus in conditions classified elsewhere and of unspecified site    Migraines    Morbid obesity (HCC)    Neuromuscular disorder (HCC)    Night sweats    Nonspecific abnormal results of thyroid  function study    Other B-complex deficiencies    Paresthesias    PONV (postoperative nausea and vomiting)    Pure hypercholesterolemia    Reflux    Sacral fracture (HCC)    Septic shock (HCC) 05/08/2022   Sinus problem    Sleep apnea    Sore on toe (HCC) 05/17/2021   Symptomatic states associated with artificial menopause    Thyroid  disease  Type II or unspecified type diabetes mellitus without mention of complication, not stated as uncontrolled    Unspecified sleep apnea     Social History   Socioeconomic History   Marital status: Married    Spouse name: Not on file   Number of children: 3   Years of education: college   Highest education level: Associate degree: academic program  Occupational History   Occupation: Animal nutritionist: [employer]  Tobacco Use   Smoking status: Never   Smokeless tobacco: Never  Vaping Use   Vaping status: Never Used  Substance and Sexual Activity   Alcohol use: Yes    Comment: Socially   Drug use: No   Sexual activity: Not Currently    Birth control/protection: None    Comment: Hysterectomy  Other Topics Concern   Not on file  Social History Narrative    Married x 29 years, happily married; no abuse.    Children: 3 children; 1 grandchild; two step  grandchildren; 1 gg.   Lives: with husband, mother-in-law.   Employment:  [employer] x 1996; Advice worker.  Loves work.   Tobacco: never   Alcohol: socially.  One glass of wine per month.    Seatbelt: 100%   Guns: gun safe in garage.   Social Drivers of Corporate investment banker Strain: Low Risk  (04/25/2023)   Overall Financial Resource Strain (CARDIA)    Difficulty of Paying Living Expenses: Not very hard  Food Insecurity: Low Risk  (06/11/2023)   Received from [facility]   Hunger Vital Sign    Worried About Running Out of Food in the Last Year: Never true    Ran Out of Food in the Last Year: Never true  Transportation Needs: No Transportation Needs (06/11/2023)   Received from [facility]    In the past 12 months, has lack of reliable transportation kept you from medical appointments, meetings, work or from getting things needed for daily living? : No  Physical Activity: Inactive (04/25/2023)   Exercise Vital Sign    Days of Exercise per Week: 0 days    Minutes of Exercise per Session: 0 min  Stress: No Stress Concern Present (04/25/2023)   [employer] of Occupational Health - Occupational Stress Questionnaire    Feeling of Stress : Not at all  Social Connections: Socially Integrated (04/25/2023)   Social Connection and Isolation Panel [NHANES]    Frequency of Communication with Friends and Family: More than three times a week    Frequency of Social Gatherings with Friends and Family: Once a week    Attends Religious Services: 1 to 4 times per year    Active Member of [employer] or Organizations: No    Attends Banker Meetings: 1 to 4 times per year    Marital Status: Married  Catering manager Violence: Not At Risk (10/03/2022)   Humiliation, Afraid, Rape, and Kick questionnaire    Fear of Current or Ex-Partner: No    Emotionally Abused: No    Physically Abused: No    Sexually Abused: No    Past Surgical History:  Procedure Laterality Date   abcess  removal  03/21/1995   MRSA   ABDOMINAL HYSTERECTOMY  03/21/1995   complete in 1997, partial was in 1995   carpal tunnel release r  07/03/2013   Dalldorf   CATARACT EXTRACTION W/PHACO Left 08/10/2022   Procedure: CATARACT EXTRACTION PHACO AND INTRAOCULAR LENS PLACEMENT (IOC) LEFT DIABETIC  ASPIRE ENVISTA  LENS  4.39  00:36.5;  Surgeon: Trudi Fus, MD;  [facility];  Service: Ophthalmology;  Laterality: Left;   CATARACT EXTRACTION W/PHACO Right 08/24/2022   Procedure: CATARACT EXTRACTION PHACO AND INTRAOCULAR LENS PLACEMENT (IOC) RIGHT DIABETIC  ASPIRE ENVISTA LENS  5.09  00:31.3;  Surgeon: Trudi Fus, MD;  Location: [facility];  Service: Ophthalmology;  Laterality: Right;   CESAREAN SECTION  1987  and 1989   CHOLECYSTECTOMY  03/20/1989   COLONOSCOPY  03/21/2011   Normal.  [facility].   KNEE SURGERY  03/20/2004   LAPAROSCOPIC GASTRIC BANDING  05/18/2009   SPINE SURGERY     TONSILLECTOMY AND ADENOIDECTOMY  03/20/1972   TUBAL LIGATION     ULNAR TUNNEL RELEASE Right 02/05/2018   Procedure: CUBITAL TUNNEL RELEASE/DECOMPRESSION;  Surgeon: Dayne Even, MD;  Location: [facility];  Service: Orthopedics;  Laterality: Right;    Family History  Problem Relation Age of Onset   Diabetes Mother    Heart disease Mother 51       CHF, CAD   Other Mother        muscle disease   Hypertension Mother    Hyperlipidemia Mother    Arthritis Mother        OA hip L s/p THR   Leukemia Mother 4   Kidney disease Mother    Diabetes Father    Heart disease Father        AMI/CABG/valve replacement age 53.   Stroke Father    Diabetes Brother    Heart disease Brother        stent age 3.   Hypertension Brother    Obesity Brother    Stroke Brother    Cancer Paternal Aunt    Varicose Veins Paternal Aunt    Alzheimer's disease Maternal Grandmother    Emphysema Maternal Grandfather    Heart disease Paternal Grandmother    Fibromyalgia Daughter    Crohn's  disease Daughter    Asthma Daughter    Obesity Daughter    Asthma Daughter    Hypertension Son    Breast cancer Other        2 Aunts    Allergies  Allergen Reactions   Amoxicillin Rash, Shortness Of Breath and Other (See Comments)    Patient states she was previously able to tolerate Keflex without issue   Bee Venom Anaphylaxis   Levofloxacin Rash, Shortness Of Breath and Other (See Comments)   Meloxicam  Hives, Rash and Other (See Comments)   Vancomycin Anaphylaxis and Other (See Comments)   Ciprofloxacin  Hives, Itching, Swelling and Other (See Comments)   Sulfa  Antibiotics Rash and Other (See Comments)   Ace Inhibitors Cough and Other (See Comments)   Codeine Other (See Comments)   Meperidine Hcl Other (See Comments)   Tyloxapol     Other reaction(s): vomiting/rash   Tequin [Gatifloxacin] Rash       Latest Ref Rng & Units 07/03/2023    1:32 PM 04/25/2023    2:42 PM 06/14/2022    1:44 PM  CBC  WBC 4.0 - 10.5 K/uL 8.1  8.7  9.6   Hemoglobin 12.0 - 15.0 g/dL 16.1  09.6  04.5   Hematocrit 36.0 - 46.0 % 42.4  44.0  42.2   Platelets 150.0 - 400.0 K/uL 277.0  324.0  238       CMP     Component Value Date/Time   NA 143 07/03/2023 1332   K 4.3 07/03/2023 1332  CL 102 07/03/2023 1332   CO2 31 07/03/2023 1332   GLUCOSE 188 (H) 07/03/2023 1332   BUN 27 (H) 07/03/2023 1332   CREATININE 1.05 07/03/2023 1332   CREATININE 1.07 (H) 05/19/2022 1554   CALCIUM  9.1 07/03/2023 1332   PROT 6.1 07/03/2023 1332   ALBUMIN 3.7 07/03/2023 1332   AST 23 07/03/2023 1332   ALT 37 (H) 07/03/2023 1332   ALKPHOS 54 07/03/2023 1332   BILITOT 0.4 07/03/2023 1332   GFR 58.35 (L) 07/03/2023 1332   GFRNONAA 57 (L) 06/14/2022 1344   GFRNONAA >89 01/12/2014 1341     No results found.     Assessment & Plan:   1. Lymphedema (Primary) Recommend:  No surgery or intervention at this point in time.   The Patient is CEAP [id number].  The patient has been wearing compression for more than 12  weeks with no or little benefit.  The patient has been exercising daily for more than 12 weeks. The patient has been elevating and taking OTC pain medications for more than 12 weeks.  None of these have have eliminated the pain related to the lymphedema or the discomfort regarding excessive swelling and venous congestion.    I have reviewed my discussion with the patient regarding lymphedema and why it  causes symptoms.  Patient will continue wearing graduated compression on a daily basis. The patient should put the compression on first thing in the morning and removing them in the evening. The patient should not sleep in the compression.   In addition, behavioral modification throughout the day will be continued.  This will include frequent elevation (such as in a recliner), use of over the counter pain medications as needed and exercise such as walking.  The systemic causes for chronic edema such as liver, kidney and cardiac etiologies do not appear to have significant changed over the past year.    The patient has chronic , severe lymphedema with hyperpigmentation of the skin and has done MLD, skin care, medication, diet, exercise, elevation and compression for 4 weeks with no improvement,  I am recommending a lymphedema pump.  The patient still has stage 3 lymphedema and therefore, I believe that a lymph pump is needed to improve the control of the patient's lymphedema and improve the quality of life.  I also believe that the patient would benefit from a pump that extends to her abdomen as well.  Additionally, a lymph pump is warranted because it will reduce the risk of cellulitis and ulceration in the future.  Patient should follow-up in six months   2. Essential hypertension Continue antihypertensive medications as already ordered, these medications have been reviewed and there are no changes at this time.  3. Type 2 diabetes mellitus with diabetic neuropathy, with long-term current use of  insulin  (HCC) Continue hypoglycemic medications as already ordered, these medications have been reviewed and there are no changes at this time.  Hgb A1C to be monitored as already arranged by primary service  4. Decreased pedal pulses Previous study showed that the patient had adequate perfusion for healing.  The wound itself has healed.  No further testing at this time.  Current Outpatient Medications on File Prior to Visit  Medication Sig Dispense Refill   acetaminophen  (TYLENOL ) 325 MG tablet Take 650 mg by mouth every 6 (six) hours as needed.     albuterol  (PROVENTIL ) (2.5 MG/3ML) 0.083% nebulizer solution Take 3 mLs (2.5 mg total) by nebulization every 6 (six) hours as needed for wheezing  or shortness of breath. 75 mL 0   albuterol  (VENTOLIN  HFA) 108 (90 Base) MCG/ACT inhaler Inhale 1-2 puffs into the lungs every 6 (six) hours as needed for wheezing or shortness of breath. 18 g 1   atorvastatin  (LIPITOR) 40 MG tablet TAKE ONE TABLET BY MOUTH ONCE A DAY FOR CHOLESTEROL. 90 tablet 2   Bacillus Coagulans-Inulin (PROBIOTIC-PREBIOTIC PO) Take by mouth.     budesonide  (ENTOCORT EC ) 3 MG 24 hr capsule Take 6 mg by mouth daily.     budesonide -formoterol  (SYMBICORT ) 80-4.5 MCG/ACT inhaler Inhale 2 puffs into the lungs 2 (two) times daily. Rinse after use. 1 each 3   colestipol (COLESTID) 1 g tablet Take 1 g by mouth 2 (two) times daily. Patient takes 2 tablets BID     Continuous Blood Gluc Receiver (DEXCOM G7 RECEIVER) DEVI by Does not apply route.     Continuous Blood Gluc Sensor (DEXCOM G7 SENSOR) MISC by Does not apply route.     cyclobenzaprine  (FLEXERIL ) 10 MG tablet Take 1 tablet (10 mg total) by mouth 3 (three) times daily as needed for muscle spasms. 270 tablet 2   EPINEPHrine  (EPIPEN  2-PAK) 0.3 mg/0.3 mL IJ SOAJ injection Inject 0.3 mg into the muscle as needed for anaphylaxis. 1 each 1   ergocalciferol  (VITAMIN D2) 1.25 MG (50000 UT) capsule Take by mouth.     fexofenadine (ALLEGRA) 180  MG tablet 1 tablet     FLUoxetine  (PROZAC ) 40 MG capsule TAKE ONE CAPSULE BY MOUTH ONCE DAILY FOR ANXIETY AND DEPRESSION 90 capsule 2   fluticasone  (FLONASE ) 50 MCG/ACT nasal spray Place 1 spray into both nostrils 2 (two) times daily. 48 mL 1   gabapentin  (NEURONTIN ) 600 MG tablet Take 1 tablet (600 mg total) by mouth 2 (two) times daily. For pain 180 tablet 2   hydrochlorothiazide  (HYDRODIURIL ) 12.5 MG tablet TAKE ONE TABLET BY MOUTH ONCE DAILY FOR BLOOD PRESSURE 90 tablet 2   insulin  aspart (NOVOLOG  FLEXPEN) 100 UNIT/ML FlexPen Inject 18-22 Units into the skin 3 (three) times daily with meals.     insulin  degludec (TRESIBA ) 100 UNIT/ML FlexTouch Pen Inject 30 Units into the skin 2 (two) times daily.     Insulin  Pen Needle 32G X 4 MM MISC Use 1 needle with pen as directed 100 each 11   ketoconazole  (NIZORAL ) 2 % cream Apply 1 Application topically daily as needed for irritation. 30 g 0   levocetirizine (XYZAL ) 5 MG tablet TAKE ONE TABLET BY MOUTH EVERY EVENING 90 tablet 1   losartan  (COZAAR ) 50 MG tablet TAKE ONE TABLET BY MOUTH ONCE DAILY FOR BLOOD PRESSURE 90 tablet 2   metoprolol  tartrate (LOPRESSOR ) 25 MG tablet TAKE 0.5 TABLETS (12.5 MG TOTAL) BY MOUTH TWO (TWO) TIMES DAILY FOR BLOOD PRESSURE. 90 tablet 1   MOUNJARO 5 MG/0.5ML Pen Inject 5 mg into the skin once a week.     Multiple Vitamin (MULTIVITAMIN PO) Take 1 tablet by mouth daily.      Nebulizer MISC Dispense 1 nebulizer for home use, with tubing and supplies. J45.909. 1 each 0   ondansetron  (ZOFRAN -ODT) 4 MG disintegrating tablet TAKE 1 TABLET BY MOUTH EVERY 8 HOURS AS NEEDED FOR NAUSEA AS DIRECTED. 20 tablet 0   VALERIAN ROOT PO Take 1,000 mg by mouth at bedtime.     doxycycline  (VIBRA -TABS) 100 MG tablet Take 1 tablet (100 mg total) by mouth 2 (two) times daily. (Patient not taking: Reported on 07/03/2023) 20 tablet 0   Dulaglutide  4.5 MG/0.5ML SOPN  Inject into the skin. (Patient not taking: Reported on 07/03/2023)     Current  Facility-Administered Medications on File Prior to Visit  Medication Dose Route Frequency Provider Last Rate Last Admin   silver  sulfADIAZINE  (SILVADENE ) 1 % cream 1 Application  1 Application Topical Once Cable, James M, NP        There are no Patient Instructions on file for this visit. No follow-ups on file.   Pranathi Winfree E Riyan Gavina, NP

## 2023-08-06 ENCOUNTER — Other Ambulatory Visit: Payer: Self-pay | Admitting: Primary Care

## 2023-08-06 DIAGNOSIS — M5116 Intervertebral disc disorders with radiculopathy, lumbar region: Secondary | ICD-10-CM

## 2023-08-07 ENCOUNTER — Encounter (INDEPENDENT_AMBULATORY_CARE_PROVIDER_SITE_OTHER): Payer: Self-pay

## 2023-08-07 NOTE — Telephone Encounter (Signed)
 Please set patient up for virtual or in office visit for shingles.

## 2023-08-08 ENCOUNTER — Encounter: Payer: Self-pay | Admitting: Primary Care

## 2023-08-09 ENCOUNTER — Encounter: Payer: Self-pay | Admitting: Primary Care

## 2023-08-09 ENCOUNTER — Telehealth: Admitting: Primary Care

## 2023-08-09 DIAGNOSIS — R21 Rash and other nonspecific skin eruption: Secondary | ICD-10-CM | POA: Insufficient documentation

## 2023-08-09 MED ORDER — VALACYCLOVIR HCL 1 G PO TABS: 1000.0000 mg | ORAL_TABLET | Freq: Three times a day (TID) | ORAL | 0 refills | Status: AC

## 2023-08-09 NOTE — Assessment & Plan Note (Signed)
 Differentials include herpes zoster versus allergic reaction to Mounjaro.  Start Valtrex 1 g 3 times daily x 7 days.  If no improvement then will discontinue Mounjaro. She will update.

## 2023-08-09 NOTE — Patient Instructions (Signed)
  Start Valtrex tablets.  Take 1 tablet by mouth 3 times daily for 7 days.  Please notify me if no improvement.  It was a pleasure to see you today!

## 2023-08-09 NOTE — Progress Notes (Signed)
 Patient ID: [name], female    DOB: Dec 30, 1965, 58 y.o.   MRN: [id number]  Virtual visit completed through [employer], a video enabled telemedicine application. Due to national recommendations of social distancing due to COVID-19, a virtual visit is felt to be most appropriate for this patient at this time. Reviewed limitations, risks, security and privacy concerns of performing a virtual visit and the availability of in person appointments. I also reviewed that there may be a patient responsible charge related to this service. The patient agreed to proceed.   Patient location: home Provider location: [place] [facility], office Persons participating in this virtual visit: patient, provider   If any vitals were documented, they were collected by patient at home unless specified below.    There were no vitals taken for this visit.   CC: Rash Subjective:   HPI: [name] is a 58 y.o. female with a history of herpes zoster, hypertension, type 2 diabetes, lower extremity weakness, bilateral lower extremity edema presenting on 08/09/2023 for Rash (Rash on left side of chest and right shoulder. First noticed rash on Sunday 5/18)  She received her first Shingrix vaccine in late April/early May.  One week later she began to notice "blisters" to the left breast with intermittent tingling/burning sensation to the site. Four days ago she noticed new blisters around the right axilla which are itchy.  She denies burning/tingling around the right axilla. The blisters to the left breast are healing. She has a prior history of herpes zoster to the left thoracic back. The rash to her left breast appears similar.   She denies changes in soaps, detergents but she began Mounjaro 2.5 mg once month ago right around the time the blisters popped up to the left breast.  She denies wheezing, throat tightness.       Relevant past medical, surgical, family and social history reviewed and updated as  indicated. Interim medical history since our last visit reviewed. Allergies and medications reviewed and updated. Outpatient Medications Prior to Visit  Medication Sig Dispense Refill   acetaminophen  (TYLENOL ) 325 MG tablet Take 650 mg by mouth every 6 (six) hours as needed.     albuterol  (PROVENTIL ) (2.5 MG/3ML) 0.083% nebulizer solution Take 3 mLs (2.5 mg total) by nebulization every 6 (six) hours as needed for wheezing or shortness of breath. 75 mL 0   albuterol  (VENTOLIN  HFA) 108 (90 Base) MCG/ACT inhaler Inhale 1-2 puffs into the lungs every 6 (six) hours as needed for wheezing or shortness of breath. 18 g 1   atorvastatin  (LIPITOR) 40 MG tablet TAKE ONE TABLET BY MOUTH ONCE A DAY FOR CHOLESTEROL. 90 tablet 2   Bacillus Coagulans-Inulin (PROBIOTIC-PREBIOTIC PO) Take by mouth.     budesonide  (ENTOCORT EC ) 3 MG 24 hr capsule Take 6 mg by mouth daily.     budesonide -formoterol  (SYMBICORT ) 80-4.5 MCG/ACT inhaler Inhale 2 puffs into the lungs 2 (two) times daily. Rinse after use. 1 each 3   colestipol (COLESTID) 1 g tablet Take 1 g by mouth 2 (two) times daily. Patient takes 2 tablets BID     Continuous Blood Gluc Receiver (DEXCOM G7 RECEIVER) DEVI by Does not apply route.     Continuous Blood Gluc Sensor (DEXCOM G7 SENSOR) MISC by Does not apply route.     cyclobenzaprine  (FLEXERIL ) 10 MG tablet Take 1 tablet (10 mg total) by mouth 3 (three) times daily as needed for muscle spasms. 270 tablet 2   EPINEPHrine  (EPIPEN  2-PAK)  0.3 mg/0.3 mL IJ SOAJ injection Inject 0.3 mg into the muscle as needed for anaphylaxis. 1 each 1   ergocalciferol  (VITAMIN D2) 1.25 MG (50000 UT) capsule Take by mouth.     fexofenadine (ALLEGRA) 180 MG tablet 1 tablet     FLUoxetine  (PROZAC ) 40 MG capsule TAKE ONE CAPSULE BY MOUTH ONCE DAILY FOR ANXIETY AND DEPRESSION 90 capsule 2   fluticasone  (FLONASE ) 50 MCG/ACT nasal spray Place 1 spray into both nostrils 2 (two) times daily. 48 mL 1   gabapentin  (NEURONTIN ) 600 MG  tablet TAKE ONE TABLET (600 MG TOTAL) BY MOUTH TWO (TWO) TIMES DAILY FOR PAIN 180 tablet 0   hydrochlorothiazide  (HYDRODIURIL ) 12.5 MG tablet TAKE ONE TABLET BY MOUTH ONCE DAILY FOR BLOOD PRESSURE 90 tablet 2   insulin  aspart (NOVOLOG  FLEXPEN) 100 UNIT/ML FlexPen Inject 18-22 Units into the skin 3 (three) times daily with meals.     insulin  degludec (TRESIBA ) 100 UNIT/ML FlexTouch Pen Inject 30 Units into the skin 2 (two) times daily.     Insulin  Pen Needle 32G X 4 MM MISC Use 1 needle with pen as directed 100 each 11   ketoconazole  (NIZORAL ) 2 % cream Apply 1 Application topically daily as needed for irritation. 30 g 0   levocetirizine (XYZAL ) 5 MG tablet TAKE ONE TABLET BY MOUTH EVERY EVENING 90 tablet 1   losartan  (COZAAR ) 50 MG tablet TAKE ONE TABLET BY MOUTH ONCE DAILY FOR BLOOD PRESSURE 90 tablet 2   metoprolol  tartrate (LOPRESSOR ) 25 MG tablet TAKE 0.5 TABLETS (12.5 MG TOTAL) BY MOUTH TWO (TWO) TIMES DAILY FOR BLOOD PRESSURE. 90 tablet 1   MOUNJARO 5 MG/0.5ML Pen Inject 5 mg into the skin once a week.     Multiple Vitamin (MULTIVITAMIN PO) Take 1 tablet by mouth daily.      Nebulizer MISC Dispense 1 nebulizer for home use, with tubing and supplies. J45.909. 1 each 0   ondansetron  (ZOFRAN -ODT) 4 MG disintegrating tablet TAKE 1 TABLET BY MOUTH EVERY 8 HOURS AS NEEDED FOR NAUSEA AS DIRECTED. 20 tablet 0   VALERIAN ROOT PO Take 1,000 mg by mouth at bedtime.     doxycycline  (VIBRA -TABS) 100 MG tablet Take 1 tablet (100 mg total) by mouth 2 (two) times daily. (Patient not taking: Reported on 08/09/2023) 20 tablet 0   Dulaglutide  4.5 MG/0.5ML SOPN Inject into the skin. (Patient not taking: Reported on 08/09/2023)     Facility-Administered Medications Prior to Visit  Medication Dose Route Frequency Provider Last Rate Last Admin   silver  sulfADIAZINE  (SILVADENE ) 1 % cream 1 Application  1 Application Topical Once Dorothe Gaster, NP         Per HPI unless specifically indicated in ROS section  below Review of Systems  HENT:  Negative for trouble swallowing.   Respiratory:  Negative for shortness of breath and wheezing.   Skin:  Positive for rash.   Objective:  There were no vitals taken for this visit.  Wt Readings from Last 3 Encounters:  07/03/23 (!) 330 lb 11 oz (150 kg)  07/03/23 (!) 311 lb (141.1 kg)  01/23/23 (!) 311 lb (141.1 kg)       Physical exam: General: Alert and oriented x 3, no distress, does not appear sickly  Pulmonary: Speaks in complete sentences without increased work of breathing, no cough during visit.  Psychiatric: Normal mood, thought content, and behavior.  Integumentary: Dark purple, flat, scabbed lesions to left anterior breast. Clear, small vesicles noted around right axilla.  Results for orders placed or performed in visit on 08/08/23  Hemoglobin A1c   Collection Time: 06/11/23 12:00 AM  Result Value Ref Range   Hemoglobin A1C 6.5    *Note: Due to a large number of results and/or encounters for the requested time period, some results have not been displayed. A complete set of results can be found in Results Review.   Assessment & Plan:   Problem List Items Addressed This Visit       Musculoskeletal and Integument   Rash and nonspecific skin eruption - Primary   Differentials include herpes zoster versus allergic reaction to Mounjaro.  Start Valtrex 1 g 3 times daily x 7 days.  If no improvement then will discontinue Mounjaro. She will update.      Relevant Medications   valACYclovir (VALTREX) 1000 MG tablet     Meds ordered this encounter  Medications   valACYclovir (VALTREX) 1000 MG tablet    Sig: Take 1 tablet (1,000 mg total) by mouth 3 (three) times daily for 7 days.    Dispense:  21 tablet    Refill:  0    Supervising Provider:   BEDSOLE, AMY E [[id number]]   No orders of the defined types were placed in this encounter.   I discussed the assessment and treatment plan with the patient. The patient was provided an  opportunity to ask questions and all were answered. The patient agreed with the plan and demonstrated an understanding of the instructions. The patient was advised to call back or seek an in-person evaluation if the symptoms worsen or if the condition fails to improve as anticipated.  Follow up plan:  Start Valtrex tablets.  Take 1 tablet by mouth 3 times daily for 7 days.  Please notify me if no improvement.  It was a pleasure to see you today!   Jelan Batterton K Selam Pietsch, NP

## 2023-08-10 ENCOUNTER — Ambulatory Visit: Admitting: Primary Care

## 2023-08-25 DIAGNOSIS — G4733 Obstructive sleep apnea (adult) (pediatric): Secondary | ICD-10-CM | POA: Diagnosis not present

## 2023-09-03 DIAGNOSIS — G4733 Obstructive sleep apnea (adult) (pediatric): Secondary | ICD-10-CM | POA: Diagnosis not present

## 2023-09-06 ENCOUNTER — Encounter (INDEPENDENT_AMBULATORY_CARE_PROVIDER_SITE_OTHER): Payer: Self-pay

## 2023-09-06 NOTE — Telephone Encounter (Signed)
 Spoke with the patient and Matt from [employer] will be calling the patient shortly to get her set up with a lymph pump.

## 2023-09-13 DIAGNOSIS — E1165 Type 2 diabetes mellitus with hyperglycemia: Secondary | ICD-10-CM | POA: Diagnosis not present

## 2023-10-08 ENCOUNTER — Ambulatory Visit: Payer: Medicare PPO

## 2023-10-08 VITALS — BP 127/65 | Ht 66.0 in | Wt 330.0 lb

## 2023-10-08 DIAGNOSIS — Z0000 Encounter for general adult medical examination without abnormal findings: Secondary | ICD-10-CM | POA: Diagnosis not present

## 2023-10-08 NOTE — Patient Instructions (Signed)
 Ms. Dietzman , Thank you for taking time out of your busy schedule to complete your Annual Wellness Visit with me. I enjoyed our conversation and look forward to speaking with you again next year. I, as well as your care team,  appreciate your ongoing commitment to your health goals. Please review the following plan we discussed and let me know if I can assist you in the future. Your Game plan/ To Do List    Referrals: If you haven't heard from the office you've been referred to, please reach out to them at the phone provided.  none Follow up Visits: Next Medicare AWV with our clinical staff: 10/08/2024   Have you seen your provider in the last 6 months (3 months if uncontrolled diabetes)? Yes Next Office Visit with your provider: none at this time  Clinician Recommendations:  Aim for 30 minutes of exercise or brisk walking, 6-8 glasses of water, and 5 servings of fruits and vegetables each day.       This is a list of the screening recommended for you and due dates:  Health Maintenance  Topic Date Due   Zoster (Shingles) Vaccine (1 of 2) Never done   COVID-19 Vaccine (4 - 2024-25 season) 11/19/2022   Complete foot exam   06/09/2023   Eye exam for diabetics  06/09/2023   Pneumococcal Vaccination (2 of 2 - PCV) 07/18/2023   Yearly kidney health urinalysis for diabetes  10/11/2023   Flu Shot  10/19/2023   Hemoglobin A1C  12/12/2023   Yearly kidney function blood test for diabetes  07/02/2024   Medicare Annual Wellness Visit  10/07/2024   Mammogram  12/10/2024   DTaP/Tdap/Td vaccine (3 - Td or Tdap) 07/28/2030   Colon Cancer Screening  09/18/2031   Hepatitis B Vaccine  Completed   Hepatitis C Screening  Completed   HIV Screening  Completed   HPV Vaccine  Aged Out   Meningitis B Vaccine  Aged Out    Advanced directives: (Declined) Advance directive discussed with you today. Even though you declined this today, please call our office should you change your mind, and we can give you the  proper paperwork for you to fill out. Advance Care Planning is important because it:  [x]  Makes sure you receive the medical care that is consistent with your values, goals, and preferences  [x]  It provides guidance to your family and loved ones and reduces their decisional burden about whether or not they are making the right decisions based on your wishes.  Follow the link provided in your after visit summary or read over the paperwork we have mailed to you to help you started getting your Advance Directives in place. If you need assistance in completing these, please reach out to us  so that we can help you!  See attachments for Preventive Care and Fall Prevention Tips.

## 2023-10-08 NOTE — Progress Notes (Signed)
 Because this visit was a virtual/telehealth visit,  certain criteria was not obtained, such a blood pressure, CBG if applicable, and timed get up and go. Any medications not marked as taking were not mentioned during the medication reconciliation part of the visit. Any vitals not documented were not able to be obtained due to this being a telehealth visit or patient was unable to self-report a recent blood pressure reading due to a lack of equipment at home via telehealth. Vitals that have been documented are verbally provided by the patient.   This visit was performed by a medical professional under my direct supervision. I was immediately available for consultation/collaboration. I have reviewed and agree with the Annual Wellness Visit documentation.  Subjective:   [name] is a 58 y.o. who presents for a Medicare Wellness preventive visit.  As a reminder, Annual Wellness Visits don't include a physical exam, and some assessments may be limited, especially if this visit is performed virtually. We may recommend an in-person follow-up visit with your provider if needed.  Visit Complete: Virtual I connected with  [name] on 10/08/23 by a audio enabled telemedicine application and verified that I am speaking with the correct person using two identifiers.  Patient Location: Home  Provider Location: Home Office  I discussed the limitations of evaluation and management by telemedicine. The patient expressed understanding and agreed to proceed.  Vital Signs: Because this visit was a virtual/telehealth visit, some criteria may be missing or patient reported. Any vitals not documented were not able to be obtained and vitals that have been documented are patient reported.  VideoDeclined- This patient declined Librarian, academic. Therefore the visit was completed with audio only.  Persons Participating in Visit: Patient  AWV Questionnaire: Yes: Patient  Medicare AWV questionnaire was completed by the patient on 10/07/2023; I have confirmed that all information answered by patient is correct and no changes since this date.  Cardiac Risk Factors include: obesity (BMI >30kg/m2);advanced age (>82men, >49 women);diabetes mellitus;hypertension     Objective:    Today's Vitals   10/08/23 1144  BP: 127/65  Weight: (!) 330 lb (149.7 kg)  Height: 5' 6 (1.676 m)   Body mass index is 53.26 kg/m.     10/08/2023   11:43 AM 07/03/2023    7:46 PM 10/03/2022   11:12 AM 08/24/2022    9:17 AM 08/10/2022   10:24 AM 05/11/2022   11:00 AM 09/30/2021    3:04 PM  Advanced Directives  Does Patient Have a Medical Advance Directive? No No No No No No No  Would patient like information on creating a medical advance directive? No - Patient declined No - Patient declined No - Patient declined No - Patient declined Yes (MAU/Ambulatory/Procedural Areas - Information given) No - Patient declined     Current Medications (verified) Outpatient Encounter Medications as of 10/08/2023  Medication Sig   acetaminophen  (TYLENOL ) 325 MG tablet Take 650 mg by mouth every 6 (six) hours as needed.   albuterol  (PROVENTIL ) (2.5 MG/3ML) 0.083% nebulizer solution Take 3 mLs (2.5 mg total) by nebulization every 6 (six) hours as needed for wheezing or shortness of breath.   albuterol  (VENTOLIN  HFA) 108 (90 Base) MCG/ACT inhaler Inhale 1-2 puffs into the lungs every 6 (six) hours as needed for wheezing or shortness of breath.   atorvastatin  (LIPITOR) 40 MG tablet TAKE ONE TABLET BY MOUTH ONCE A DAY FOR CHOLESTEROL.   Bacillus Coagulans-Inulin (PROBIOTIC-PREBIOTIC PO) Take by mouth.  budesonide  (ENTOCORT EC ) 3 MG 24 hr capsule Take 6 mg by mouth daily.   budesonide -formoterol  (SYMBICORT ) 80-4.5 MCG/ACT inhaler Inhale 2 puffs into the lungs 2 (two) times daily. Rinse after use.   colestipol (COLESTID) 1 g tablet Take 1 g by mouth 2 (two) times daily. Patient takes 2 tablets BID    Continuous Blood Gluc Receiver (DEXCOM G7 RECEIVER) DEVI by Does not apply route.   Continuous Blood Gluc Sensor (DEXCOM G7 SENSOR) MISC by Does not apply route.   cyclobenzaprine  (FLEXERIL ) 10 MG tablet Take 1 tablet (10 mg total) by mouth 3 (three) times daily as needed for muscle spasms.   EPINEPHrine  (EPIPEN  2-PAK) 0.3 mg/0.3 mL IJ SOAJ injection Inject 0.3 mg into the muscle as needed for anaphylaxis.   ergocalciferol  (VITAMIN D2) 1.25 MG (50000 UT) capsule Take by mouth.   fexofenadine (ALLEGRA) 180 MG tablet 1 tablet   FLUoxetine  (PROZAC ) 40 MG capsule TAKE ONE CAPSULE BY MOUTH ONCE DAILY FOR ANXIETY AND DEPRESSION   fluticasone  (FLONASE ) 50 MCG/ACT nasal spray Place 1 spray into both nostrils 2 (two) times daily.   gabapentin  (NEURONTIN ) 600 MG tablet TAKE ONE TABLET (600 MG TOTAL) BY MOUTH TWO (TWO) TIMES DAILY FOR PAIN   hydrochlorothiazide  (HYDRODIURIL ) 12.5 MG tablet TAKE ONE TABLET BY MOUTH ONCE DAILY FOR BLOOD PRESSURE   insulin  aspart (NOVOLOG  FLEXPEN) 100 UNIT/ML FlexPen Inject 18-22 Units into the skin 3 (three) times daily with meals.   insulin  degludec (TRESIBA ) 100 UNIT/ML FlexTouch Pen Inject 30 Units into the skin 2 (two) times daily.   Insulin  Pen Needle 32G X 4 MM MISC Use 1 needle with pen as directed   ketoconazole  (NIZORAL ) 2 % cream Apply 1 Application topically daily as needed for irritation.   levocetirizine (XYZAL ) 5 MG tablet TAKE ONE TABLET BY MOUTH EVERY EVENING   losartan  (COZAAR ) 50 MG tablet TAKE ONE TABLET BY MOUTH ONCE DAILY FOR BLOOD PRESSURE   metoprolol  tartrate (LOPRESSOR ) 25 MG tablet TAKE 0.5 TABLETS (12.5 MG TOTAL) BY MOUTH TWO (TWO) TIMES DAILY FOR BLOOD PRESSURE.   Multiple Vitamin (MULTIVITAMIN PO) Take 1 tablet by mouth daily.    Nebulizer MISC Dispense 1 nebulizer for home use, with tubing and supplies. J45.909.   ondansetron  (ZOFRAN -ODT) 4 MG disintegrating tablet TAKE 1 TABLET BY MOUTH EVERY 8 HOURS AS NEEDED FOR NAUSEA AS DIRECTED.   VALERIAN  ROOT PO Take 1,000 mg by mouth at bedtime.   doxycycline  (VIBRA -TABS) 100 MG tablet Take 1 tablet (100 mg total) by mouth 2 (two) times daily. (Patient not taking: Reported on 08/09/2023)   Dulaglutide  4.5 MG/0.5ML SOPN Inject into the skin. (Patient not taking: Reported on 10/08/2023)   MOUNJARO 5 MG/0.5ML Pen Inject 5 mg into the skin once a week.   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   Anemia    Anxiety    Arthritis    Asthma    Bacteremia 05/09/2022   Cataract    CHF (congestive heart failure) (HCC)    Colon polyp    Complication of anesthesia    Cystitis with hematuria 10/24/2021   Diabetes mellitus  Displaced fracture of proximal end of [name] fibula 07/21/2017   Edema    Fatigue    Hypertension    IBS (irritable bowel syndrome)    Localized swelling of left foot 04/25/2023   Lump in female breast    Methicillin resistant Staphylococcus aureus in conditions classified elsewhere and of unspecified site    Migraines    Morbid obesity (HCC)    Neuromuscular disorder (HCC)    Night sweats    Nonspecific abnormal results of thyroid  function study    Other B-complex deficiencies    Paresthesias    PONV (postoperative nausea and vomiting)    Pure hypercholesterolemia    Reflux    Sacral fracture (HCC)    Septic shock (HCC) 05/08/2022   Sinus problem    Sleep apnea    Sore on toe (HCC) 05/17/2021   Symptomatic states associated with artificial menopause    Thyroid  disease    Type II or unspecified type diabetes mellitus  without mention of complication, not stated as uncontrolled    Unspecified sleep apnea    Past Surgical History:  Procedure Laterality Date   abcess removal  03/21/1995   MRSA   ABDOMINAL HYSTERECTOMY  03/21/1995   complete in 1997, partial was in 1995   carpal tunnel release r  07/03/2013   Dalldorf   CATARACT EXTRACTION W/PHACO Left 08/10/2022   Procedure: CATARACT EXTRACTION PHACO AND INTRAOCULAR LENS PLACEMENT (IOC) LEFT DIABETIC  ASPIRE ENVISTA  LENS  4.39  00:36.5;  Surgeon: Enola Feliciano Hugger, MD;  Location: [facility];  Service: Ophthalmology;  Laterality: Left;   CATARACT EXTRACTION W/PHACO [name] 08/24/2022   Procedure: CATARACT EXTRACTION PHACO AND INTRAOCULAR LENS PLACEMENT (IOC) [name] DIABETIC  ASPIRE ENVISTA LENS  5.09  00:31.3;  Surgeon: Enola Feliciano Hugger, MD;  Location: [facility];  Service: Ophthalmology;  Laterality: [name];   CESAREAN SECTION  1987  and 1989   CHOLECYSTECTOMY  03/20/1989   COLONOSCOPY  03/21/2011   Normal.  [facility].   KNEE SURGERY  03/20/2004   LAPAROSCOPIC GASTRIC BANDING  05/18/2009   SPINE SURGERY     TONSILLECTOMY AND ADENOIDECTOMY  03/20/1972   TUBAL LIGATION     ULNAR TUNNEL RELEASE [name] 02/05/2018   Procedure: CUBITAL TUNNEL RELEASE/DECOMPRESSION;  Surgeon: Sheril Coy, MD;  Location: [facility];  Service: Orthopedics;  Laterality: [name];   Family History  Problem Relation Age of Onset   Diabetes Mother    Heart disease Mother 21       CHF, CAD   Other Mother        muscle disease   Hypertension Mother    Hyperlipidemia Mother    Arthritis Mother        OA hip L s/p THR   Leukemia Mother 58   Kidney disease Mother    Diabetes Father    Heart disease Father        AMI/CABG/valve replacement age 26.   Stroke Father    Diabetes Brother    Heart disease Brother        stent age 11.   Hypertension Brother    Obesity Brother    Stroke Brother    Cancer Paternal Aunt    Varicose Veins Paternal Aunt     Alzheimer's disease Maternal Grandmother    Emphysema Maternal Grandfather    Heart disease Paternal Grandmother    Fibromyalgia Daughter    Crohn's disease Daughter    Asthma Daughter    Obesity Daughter  Asthma Daughter    Hypertension Son    Breast cancer Other        2 Aunts   Social History   Socioeconomic History   Marital status: Married    Spouse name: Not on file   Number of children: 3   Years of education: college   Highest education level: Associate degree: academic program  Occupational History   Occupation: Animal nutritionist: [employer]  Tobacco Use   Smoking status: Never   Smokeless tobacco: Never  Vaping Use   Vaping status: Never Used  Substance and Sexual Activity   Alcohol use: Yes    Comment: Socially   Drug use: No   Sexual activity: Not Currently    Birth control/protection: None    Comment: Hysterectomy  Other Topics Concern   Not on file  Social History Narrative    Married x 29 years, happily married; no abuse.    Children: 3 children; 1 grandchild; two step grandchildren; 1 gg.   Lives: with husband, mother-in-law.   Employment:  [employer] x 1996; Advice worker.  Loves work.   Tobacco: never   Alcohol: socially.  One glass of wine per month.    Seatbelt: 100%   Guns: gun safe in garage.   Social Drivers of Corporate investment banker Strain: Low Risk  (10/07/2023)   Overall Financial Resource Strain (CARDIA)    Difficulty of Paying Living Expenses: Not very hard  Food Insecurity: No Food Insecurity (10/07/2023)   Hunger Vital Sign    Worried About Running Out of Food in the Last Year: Never true    Ran Out of Food in the Last Year: Never true  Transportation Needs: No Transportation Needs (10/07/2023)   PRAPARE - Administrator, Civil Service (Medical): No    Lack of Transportation (Non-Medical): No  Physical Activity: Patient Declined (10/07/2023)   Exercise Vital Sign    Days of Exercise per Week: Patient declined     Minutes of Exercise per Session: Patient declined  Stress: No Stress Concern Present (10/07/2023)   [employer] of Occupational Health - Occupational Stress Questionnaire    Feeling of Stress: Not at all  Social Connections: Socially Integrated (10/07/2023)   Social Connection and Isolation Panel    Frequency of Communication with Friends and Family: More than three times a week    Frequency of Social Gatherings with Friends and Family: Once a week    Attends Religious Services: More than 4 times per year    Active Member of [employer] or Organizations: Yes    Attends Engineer, structural: More than 4 times per year    Marital Status: Married    Tobacco Counseling Counseling given: Not Answered    Clinical Intake:  Pre-visit preparation completed: Yes  Pain : No/denies pain     BMI - recorded: 53.26 Nutritional Status: BMI > 30  Obese Nutritional Risks: None Diabetes: Yes CBG done?: No Did pt. bring in CBG monitor from home?: No  Lab Results  Component Value Date   HGBA1C 6.5 06/11/2023   HGBA1C 6.8 10/11/2022   HGBA1C 7.8 (H) 05/08/2022     How often do you need to have someone help you when you read instructions, pamphlets, or other written materials from your doctor or pharmacy?: 1 - Never What is the last grade level you completed in school?: ASSoc  Interpreter Needed?: No  Information entered by :: [employer]   Activities of  Daily Living     10/07/2023    5:50 PM  In your present state of health, do you have any difficulty performing the following activities:  Hearing? 0  Vision? 0  Difficulty concentrating or making decisions? 0  Walking or climbing stairs? 1  Dressing or bathing? 1  Doing errands, shopping? 1  Preparing Food and eating ? N  Using the Toilet? Y  In the past six months, have you accidently leaked urine? Y  Do you have problems with loss of bowel control? Y  Managing your Medications? N  Managing your Finances?  N  Housekeeping or managing your Housekeeping? Y    Patient Care Team: Gretta Comer POUR, NP as PCP - General (Internal Medicine) Darliss Rogue, MD as PCP - Cardiology (Cardiology) Elnora Agent, MD as Referring Physician (Psychiatry) Trent Sor, RD as Dietitian (Dietician) [facility], [facility]) [place], Morna SAILOR, [place] (Inactive) as Pharmacist (Pharmacist) Hyacinth Warren PARAS, FNP (Endocrinology)  I have updated your Care Teams any recent Medical Services you may have received from other providers in the past year.     Assessment:   This is a routine wellness examination for [name].  Hearing/Vision screen Hearing Screening - Comments:: No difficulties Vision Screening - Comments:: Patient wears glasses   Goals Addressed               This Visit's Progress     Patient Stated (pt-stated)   On track     To eat healthier        Depression Screen     10/08/2023   11:52 AM 07/03/2023   12:49 PM 06/12/2023    8:15 AM 04/25/2023    2:13 PM 01/23/2023    8:04 AM 10/03/2022   11:35 AM 10/03/2022   11:22 AM  PHQ 2/9 Scores  PHQ - 2 Score 0 0 0 0 2 0 0  PHQ- 9 Score 0 0 0  3      Fall Risk     10/07/2023    5:50 PM 07/03/2023   12:49 PM 04/25/2023    2:13 PM 01/23/2023    8:05 AM 10/03/2022   11:40 AM  Fall Risk   Falls in the past year? 1 0 0 0 0  Number falls in past yr: 0 0 0 0 0  Injury with Fall? 0 0 0 0 0  Risk for fall due to : History of fall(s) No Fall Risks No Fall Risks  No Fall Risks  Follow up Falls evaluation completed;Falls prevention discussed;Education provided Falls evaluation completed Falls evaluation completed  Falls prevention discussed    MEDICARE RISK AT HOME:  Medicare Risk at Home Any stairs in or around the home?: (Patient-Rptd) Yes If so, are there any without handrails?: (Patient-Rptd) Yes Home free of loose throw rugs in walkways, pet beds, electrical cords, etc?: (Patient-Rptd) Yes Adequate lighting in your home to  reduce risk of falls?: (Patient-Rptd) Yes Life alert?: (Patient-Rptd) No Use of a cane, walker or w/c?: (Patient-Rptd) Yes Grab bars in the bathroom?: (Patient-Rptd) Yes Shower chair or bench in shower?: (Patient-Rptd) Yes Elevated toilet seat or a handicapped toilet?: (Patient-Rptd) Yes  TIMED UP AND GO:  Was the test performed?  No  Cognitive Function: 6CIT completed        10/08/2023   11:50 AM 10/03/2022   11:20 AM 09/30/2021    3:06 PM  6CIT Screen  What Year? 0 points 0 points 0 points  What month? 0 points 0 points  0 points  What time? 0 points 0 points 0 points  Count back from 20 0 points 0 points 0 points  Months in reverse 0 points 0 points 0 points  Repeat phrase 0 points 0 points 0 points  Total Score 0 points 0 points 0 points    Immunizations Immunization History  Administered Date(s) Administered   Hepatitis B, ADULT 10/28/2012, 12/19/2012, 04/29/2013   Influenza Split 12/24/2010, 12/18/2011   Influenza Whole 12/03/2022   Influenza, Mdck, Trivalent,PF 6+ MOS(egg free) 12/06/2022   Influenza,inj,Quad PF,6+ Mos 12/19/2012, 01/12/2014, 12/28/2014, 01/10/2016, 02/15/2017, 01/10/2018, 01/16/2019, 12/31/2019, 01/21/2021, 02/01/2022   Influenza-Unspecified 02/15/2017   Moderna Sars-Covid-2 Vaccination 05/12/2019, 06/10/2019, 11/25/2019   Pneumococcal Polysaccharide-23 08/22/2016, 07/18/2022   Pneumococcal-Unspecified 12/18/2005   Tdap 07/16/2009, 07/27/2020    Screening Tests Health Maintenance  Topic Date Due   Zoster Vaccines- Shingrix (1 of 2) Never done   COVID-19 Vaccine (4 - 2024-25 season) 11/19/2022   FOOT EXAM  06/09/2023   OPHTHALMOLOGY EXAM  06/09/2023   Pneumococcal Vaccine 67-50 Years old (2 of 2 - PCV) 07/18/2023   Diabetic kidney evaluation - Urine ACR  10/11/2023   INFLUENZA VACCINE  10/19/2023   HEMOGLOBIN A1C  12/12/2023   Diabetic kidney evaluation - eGFR measurement  07/02/2024   Medicare Annual Wellness (AWV)  10/07/2024   MAMMOGRAM   12/10/2024   DTaP/Tdap/Td (3 - Td or Tdap) 07/28/2030   Colonoscopy  09/18/2031   Hepatitis B Vaccines  Completed   Hepatitis C Screening  Completed   HIV Screening  Completed   HPV VACCINES  Aged Out   Meningococcal B Vaccine  Aged Out    Health Maintenance  Health Maintenance Due  Topic Date Due   Zoster Vaccines- Shingrix (1 of 2) Never done   COVID-19 Vaccine (4 - 2024-25 season) 11/19/2022   FOOT EXAM  06/09/2023   OPHTHALMOLOGY EXAM  06/09/2023   Pneumococcal Vaccine 67-50 Years old (2 of 2 - PCV) 07/18/2023   Diabetic kidney evaluation - Urine ACR  10/11/2023   Health Maintenance Items Addressed:   Additional Screening:  Vision Screening: Recommended annual ophthalmology exams for early detection of glaucoma and other disorders of the eye. Would you like a referral to an eye doctor? No    Dental Screening: Recommended annual dental exams for proper oral hygiene  Community Resource Referral / Chronic Care Management: CRR required this visit?  No   CCM required this visit?  No   Plan:    I have personally reviewed and noted the following in the patient's chart:   Medical and social history Use of alcohol, tobacco or illicit drugs  Current medications and supplements including opioid prescriptions. Patient is not currently taking opioid prescriptions. Functional ability and status Nutritional status Physical activity Advanced directives List of other physicians Hospitalizations, surgeries, and ER visits in previous 12 months Vitals Screenings to include cognitive, depression, and falls Referrals and appointments  In addition, I have reviewed and discussed with patient certain preventive protocols, quality metrics, and best practice recommendations. A written personalized care plan for preventive services as well as general preventive health recommendations were provided to patient.   [name], [place]   10/08/2023   After Visit Summary: (MyChart)  Due to this being a telephonic visit, the after visit summary with patients personalized plan was offered to patient via MyChart   Notes: Nothing significant to report at this time.

## 2023-10-16 DIAGNOSIS — Z794 Long term (current) use of insulin: Secondary | ICD-10-CM | POA: Diagnosis not present

## 2023-10-16 DIAGNOSIS — Z6841 Body Mass Index (BMI) 40.0 and over, adult: Secondary | ICD-10-CM | POA: Diagnosis not present

## 2023-10-16 DIAGNOSIS — E66813 Obesity, class 3: Secondary | ICD-10-CM | POA: Diagnosis not present

## 2023-10-16 DIAGNOSIS — E1165 Type 2 diabetes mellitus with hyperglycemia: Secondary | ICD-10-CM | POA: Diagnosis not present

## 2023-10-21 ENCOUNTER — Ambulatory Visit
Admission: RE | Admit: 2023-10-21 | Discharge: 2023-10-21 | Disposition: A | Discharge status: Home / Self Care | Source: Ambulatory Visit | Attending: Family Medicine

## 2023-10-21 VITALS — BP 139/91 | HR 85 | Temp 97.5°F | Resp 20

## 2023-10-21 DIAGNOSIS — L98499 Non-pressure chronic ulcer of skin of other sites with unspecified severity: Secondary | ICD-10-CM

## 2023-10-21 DIAGNOSIS — M25511 Pain in right shoulder: Secondary | ICD-10-CM | POA: Diagnosis not present

## 2023-10-21 MED ORDER — CELECOXIB 100 MG PO CAPS: 100.0000 mg | ORAL_CAPSULE | Freq: Two times a day (BID) | ORAL | 0 refills | Status: AC

## 2023-10-21 MED ORDER — BACITRACIN ZINC 500 UNIT/GM EX OINT: 1.0000 | TOPICAL_OINTMENT | Freq: Two times a day (BID) | CUTANEOUS | 0 refills | Status: AC

## 2023-10-21 NOTE — ED Provider Notes (Signed)
 [facility]  Note:  This document was prepared using Conservation officer, historic buildings and may include unintentional dictation errors.  MRN: [id number] DOB: 1965-09-12  Subjective:   [name] is a 58 y.o. female presenting for 3-day history of blisters on the right hip area.  No tenderness, drainage of pus or bleeding.  She has also had 1 month history of persistent right shoulder pain.  Symptoms started when she was trying to lift herself up onto her wheelchair.  Felt a sharp sudden pain going to the shoulder.  Has not been evaluated for this.  Unfortunately, she continues to have to use her arms and shoulders given her difficulty with lower leg weakness, inability to walk.  She does see a wound care clinic.  Has had her shingles vaccinations.  Was already treated for shingles as well.  Reports that this does feel different for her.   Current Facility-Administered Medications:    silver  sulfADIAZINE  (SILVADENE ) 1 % cream 1 Application, 1 Application, Topical, Once, Wendee Lynwood HERO, NP  Current Outpatient Medications:    acetaminophen  (TYLENOL ) 325 MG tablet, Take 650 mg by mouth every 6 (six) hours as needed., Disp: , Rfl:    albuterol  (PROVENTIL ) (2.5 MG/3ML) 0.083% nebulizer solution, Take 3 mLs (2.5 mg total) by nebulization every 6 (six) hours as needed for wheezing or shortness of breath., Disp: 75 mL, Rfl: 0   albuterol  (VENTOLIN  HFA) 108 (90 Base) MCG/ACT inhaler, Inhale 1-2 puffs into the lungs every 6 (six) hours as needed for wheezing or shortness of breath., Disp: 18 g, Rfl: 1   atorvastatin  (LIPITOR) 40 MG tablet, TAKE ONE TABLET BY MOUTH ONCE A DAY FOR CHOLESTEROL., Disp: 90 tablet, Rfl: 2   Bacillus Coagulans-Inulin (PROBIOTIC-PREBIOTIC PO), Take by mouth., Disp: , Rfl:    budesonide  (ENTOCORT EC ) 3 MG 24 hr capsule, Take 6 mg by mouth daily., Disp: , Rfl:    budesonide -formoterol  (SYMBICORT ) 80-4.5 MCG/ACT inhaler, Inhale 2 puffs into the lungs 2  (two) times daily. Rinse after use., Disp: 1 each, Rfl: 3   colestipol (COLESTID) 1 g tablet, Take 1 g by mouth 2 (two) times daily. Patient takes 2 tablets BID, Disp: , Rfl:    Continuous Blood Gluc Receiver (DEXCOM G7 RECEIVER) DEVI, by Does not apply route., Disp: , Rfl:    Continuous Blood Gluc Sensor (DEXCOM G7 SENSOR) MISC, by Does not apply route., Disp: , Rfl:    cyclobenzaprine  (FLEXERIL ) 10 MG tablet, Take 1 tablet (10 mg total) by mouth 3 (three) times daily as needed for muscle spasms., Disp: 270 tablet, Rfl: 2   doxycycline  (VIBRA -TABS) 100 MG tablet, Take 1 tablet (100 mg total) by mouth 2 (two) times daily. (Patient not taking: Reported on 08/09/2023), Disp: 20 tablet, Rfl: 0   Dulaglutide  4.5 MG/0.5ML SOPN, Inject into the skin. (Patient not taking: Reported on 10/08/2023), Disp: , Rfl:    EPINEPHrine  (EPIPEN  2-PAK) 0.3 mg/0.3 mL IJ SOAJ injection, Inject 0.3 mg into the muscle as needed for anaphylaxis., Disp: 1 each, Rfl: 1   ergocalciferol  (VITAMIN D2) 1.25 MG (50000 UT) capsule, Take by mouth., Disp: , Rfl:    fexofenadine (ALLEGRA) 180 MG tablet, 1 tablet, Disp: , Rfl:    FLUoxetine  (PROZAC ) 40 MG capsule, TAKE ONE CAPSULE BY MOUTH ONCE DAILY FOR ANXIETY AND DEPRESSION, Disp: 90 capsule, Rfl: 2   fluticasone  (FLONASE ) 50 MCG/ACT nasal spray, Place 1 spray into both nostrils 2 (two) times daily., Disp: 48 mL, Rfl: 1  gabapentin  (NEURONTIN ) 600 MG tablet, TAKE ONE TABLET (600 MG TOTAL) BY MOUTH TWO (TWO) TIMES DAILY FOR PAIN, Disp: 180 tablet, Rfl: 0   hydrochlorothiazide  (HYDRODIURIL ) 12.5 MG tablet, TAKE ONE TABLET BY MOUTH ONCE DAILY FOR BLOOD PRESSURE, Disp: 90 tablet, Rfl: 2   insulin  aspart (NOVOLOG  FLEXPEN) 100 UNIT/ML FlexPen, Inject 18-22 Units into the skin 3 (three) times daily with meals., Disp: , Rfl:    insulin  degludec (TRESIBA ) 100 UNIT/ML FlexTouch Pen, Inject 30 Units into the skin 2 (two) times daily., Disp: , Rfl:    Insulin  Pen Needle 32G X 4 MM MISC, Use 1  needle with pen as directed, Disp: 100 each, Rfl: 11   ketoconazole  (NIZORAL ) 2 % cream, Apply 1 Application topically daily as needed for irritation., Disp: 30 g, Rfl: 0   levocetirizine (XYZAL ) 5 MG tablet, TAKE ONE TABLET BY MOUTH EVERY EVENING, Disp: 90 tablet, Rfl: 1   losartan  (COZAAR ) 50 MG tablet, TAKE ONE TABLET BY MOUTH ONCE DAILY FOR BLOOD PRESSURE, Disp: 90 tablet, Rfl: 2   metoprolol  tartrate (LOPRESSOR ) 25 MG tablet, TAKE 0.5 TABLETS (12.5 MG TOTAL) BY MOUTH TWO (TWO) TIMES DAILY FOR BLOOD PRESSURE., Disp: 90 tablet, Rfl: 1   MOUNJARO 5 MG/0.5ML Pen, Inject 5 mg into the skin once a week., Disp: , Rfl:    Multiple Vitamin (MULTIVITAMIN PO), Take 1 tablet by mouth daily. , Disp: , Rfl:    Nebulizer MISC, Dispense 1 nebulizer for home use, with tubing and supplies. J45.909., Disp: 1 each, Rfl: 0   ondansetron  (ZOFRAN -ODT) 4 MG disintegrating tablet, TAKE 1 TABLET BY MOUTH EVERY 8 HOURS AS NEEDED FOR NAUSEA AS DIRECTED., Disp: 20 tablet, Rfl: 0   VALERIAN ROOT PO, Take 1,000 mg by mouth at bedtime., Disp: , Rfl:    Allergies  Allergen Reactions   Amoxicillin Rash, Shortness Of Breath and Other (See Comments)    Patient states she was previously able to tolerate Keflex without issue   Bee Venom Anaphylaxis   Levofloxacin Rash, Shortness Of Breath and Other (See Comments)   Meloxicam  Hives, Rash and Other (See Comments)   Vancomycin Anaphylaxis and Other (See Comments)   Ciprofloxacin  Hives, Itching, Swelling and Other (See Comments)   Sulfa  Antibiotics Rash and Other (See Comments)   Ace Inhibitors Cough and Other (See Comments)   Codeine Other (See Comments)   Meperidine Hcl Other (See Comments)   Tyloxapol     Other reaction(s): vomiting/rash   Tequin [Gatifloxacin] Rash    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   Anemia    Anxiety    Arthritis    Asthma     Bacteremia 05/09/2022   Cataract    CHF (congestive heart failure) (HCC)    Colon polyp    Complication of anesthesia    Cystitis with hematuria 10/24/2021   Diabetes mellitus    Displaced fracture of proximal end of right fibula 07/21/2017   Edema    Fatigue    Hypertension    IBS (irritable bowel syndrome)    Localized swelling of left foot 04/25/2023   Lump in female breast    Methicillin resistant Staphylococcus aureus in conditions classified elsewhere and of unspecified site    Migraines    Morbid obesity (HCC)    Neuromuscular disorder (HCC)    Night sweats    Nonspecific abnormal results of thyroid  function study  Other B-complex deficiencies    Paresthesias    PONV (postoperative nausea and vomiting)    Pure hypercholesterolemia    Reflux    Sacral fracture (HCC)    Septic shock (HCC) 05/08/2022   Sinus problem    Sleep apnea    Sore on toe (HCC) 05/17/2021   Symptomatic states associated with artificial menopause    Thyroid  disease    Type II or unspecified type diabetes mellitus without mention of complication, not stated as uncontrolled    Unspecified sleep apnea      Past Surgical History:  Procedure Laterality Date   abcess removal  03/21/1995   MRSA   ABDOMINAL HYSTERECTOMY  03/21/1995   complete in 1997, partial was in 1995   carpal tunnel release r  07/03/2013   Dalldorf   CATARACT EXTRACTION W/PHACO Left 08/10/2022   Procedure: CATARACT EXTRACTION PHACO AND INTRAOCULAR LENS PLACEMENT (IOC) LEFT DIABETIC  ASPIRE ENVISTA  LENS  4.39  00:36.5;  Surgeon: Enola Feliciano Hugger, MD;  Location: [facility];  Service: Ophthalmology;  Laterality: Left;   CATARACT EXTRACTION W/PHACO Right 08/24/2022   Procedure: CATARACT EXTRACTION PHACO AND INTRAOCULAR LENS PLACEMENT (IOC) RIGHT DIABETIC  ASPIRE ENVISTA LENS  5.09  00:31.3;  Surgeon: Enola Feliciano Hugger, MD;  Location: [facility];  Service: Ophthalmology;  Laterality: Right;   CESAREAN  SECTION  1987  and 1989   CHOLECYSTECTOMY  03/20/1989   COLONOSCOPY  03/21/2011   Normal.  [facility].   KNEE SURGERY  03/20/2004   LAPAROSCOPIC GASTRIC BANDING  05/18/2009   SPINE SURGERY     TONSILLECTOMY AND ADENOIDECTOMY  03/20/1972   TUBAL LIGATION     ULNAR TUNNEL RELEASE Right 02/05/2018   Procedure: CUBITAL TUNNEL RELEASE/DECOMPRESSION;  Surgeon: Sheril Coy, MD;  Location: [facility];  Service: Orthopedics;  Laterality: Right;    Family History  Problem Relation Age of Onset   Diabetes Mother    Heart disease Mother 54       CHF, CAD   Other Mother        muscle disease   Hypertension Mother    Hyperlipidemia Mother    Arthritis Mother        OA hip L s/p THR   Leukemia Mother 22   Kidney disease Mother    Diabetes Father    Heart disease Father        AMI/CABG/valve replacement age 37.   Stroke Father    Diabetes Brother    Heart disease Brother        stent age 69.   Hypertension Brother    Obesity Brother    Stroke Brother    Cancer Paternal Aunt    Varicose Veins Paternal Aunt    Alzheimer's disease Maternal Grandmother    Emphysema Maternal Grandfather    Heart disease Paternal Grandmother    Fibromyalgia Daughter    Crohn's disease Daughter    Asthma Daughter    Obesity Daughter    Asthma Daughter    Hypertension Son    Breast cancer Other        2 Aunts    Social History   Tobacco Use   Smoking status: Never   Smokeless tobacco: Never  Vaping Use   Vaping status: Never Used  Substance Use Topics   Alcohol use: Yes    Comment: Socially   Drug use: No    ROS   Objective:   Vitals: BP (!) 139/91 (BP Location: Left Arm)   Pulse  85   Temp (!) 97.5 F (36.4 C) (Oral)   Resp 20   SpO2 98%   Physical Exam Constitutional:      General: She is not in acute distress.    Appearance: Normal appearance. She is well-developed. She is not ill-appearing, toxic-appearing or diaphoretic.  HENT:     Head: Normocephalic and atraumatic.      Nose: Nose normal.     Mouth/Throat:     Mouth: Mucous membranes are moist.  Eyes:     General: No scleral icterus.       Right eye: No discharge.        Left eye: No discharge.     Extraocular Movements: Extraocular movements intact.  Cardiovascular:     Rate and Rhythm: Normal rate.  Pulmonary:     Effort: Pulmonary effort is normal.  Musculoskeletal:     Right shoulder: Tenderness (anterior deltoid and AC joint) present. No swelling, deformity, effusion, laceration, bony tenderness or crepitus. Decreased range of motion (above 90 degrees). Normal strength.  Skin:    General: Skin is warm and dry.     Findings: Rash (~7cm-8cm superficial ulceration of the skin under the pannus of the right side) present.  Neurological:     General: No focal deficit present.     Mental Status: She is alert and oriented to person, place, and time.  Psychiatric:        Mood and Affect: Mood normal.        Behavior: Behavior normal.      Dressing applied using bacitracin  ointment, nonadherent and white paper tape.  Assessment and Plan :   PDMP not reviewed this encounter.  1. Skin ulcer, unspecified ulcer stage (HCC)   2. Acute pain of right shoulder     Wound care reviewed.  Referral placed to the wound care clinic.  Low suspicion for infectious process, shingles.  Recommended follow-up with an orthopedist for consideration of an ultrasound and/or MRI for further evaluation of soft tissue, rotator cuff.  Counseled patient on potential for adverse effects with medications prescribed/recommended today, ER and return-to-clinic precautions discussed, patient verbalized understanding.    Christopher Savannah, [place] 10/21/23 1209

## 2023-10-21 NOTE — ED Triage Notes (Signed)
 Right side shoulder pain that has been going on for over a month patient has been taking flexeril  and gabapentin  and is not helping it.  Patient took it last night.  Patient has blisters on the left side groining area that's going down her leg.  Patient had blisters on her chest from the first shingles shot. She got the first one the end of April and the second one was in June.  The blisters start Thursday or Friday.  The blisters are not pain for.

## 2023-10-21 NOTE — Discharge Instructions (Signed)
 Change your dressing 2-3 times daily. Every time you change your dressing, clean the wound gently with warm water and Dial antibacterial soap. Pat the wound dry, let it breathe for roughly an hour before covering it back up. When you reapply a dressing, apply Bacitracin  ointment to the wound, then cover with non-stick/non-adherent gauze.  Follow up with the wound care clinic.   For the shoulder, follow up with an orthopedist. Take celecoxib  for pain and inflammation. I suspect you have a rotator cuff problem, this can be fully tested for with an MRI and ultrasound. So check in with the orthopedist for a recheck.

## 2023-10-24 DIAGNOSIS — M25511 Pain in right shoulder: Secondary | ICD-10-CM | POA: Diagnosis not present

## 2023-11-01 DIAGNOSIS — K5289 Other specified noninfective gastroenteritis and colitis: Secondary | ICD-10-CM | POA: Diagnosis not present

## 2023-11-13 ENCOUNTER — Other Ambulatory Visit: Payer: Self-pay | Admitting: Primary Care

## 2023-11-13 DIAGNOSIS — M5116 Intervertebral disc disorders with radiculopathy, lumbar region: Secondary | ICD-10-CM

## 2023-11-13 NOTE — Telephone Encounter (Signed)
Patient is due for CPE/follow up in mid November, this will be required prior to any further refills.  Please schedule, thank you!

## 2023-11-21 ENCOUNTER — Encounter (INDEPENDENT_AMBULATORY_CARE_PROVIDER_SITE_OTHER): Payer: Self-pay

## 2023-11-21 ENCOUNTER — Encounter: Attending: Physician Assistant | Admitting: Physician Assistant

## 2023-11-21 DIAGNOSIS — Z794 Long term (current) use of insulin: Secondary | ICD-10-CM | POA: Insufficient documentation

## 2023-11-21 DIAGNOSIS — S91114A Laceration without foreign body of right lesser toe(s) without damage to nail, initial encounter: Secondary | ICD-10-CM | POA: Diagnosis not present

## 2023-11-21 DIAGNOSIS — M25511 Pain in right shoulder: Secondary | ICD-10-CM | POA: Diagnosis not present

## 2023-11-21 DIAGNOSIS — E1143 Type 2 diabetes mellitus with diabetic autonomic (poly)neuropathy: Secondary | ICD-10-CM | POA: Diagnosis not present

## 2023-11-21 DIAGNOSIS — S91111A Laceration without foreign body of right great toe without damage to nail, initial encounter: Secondary | ICD-10-CM | POA: Diagnosis not present

## 2023-11-21 DIAGNOSIS — L97511 Non-pressure chronic ulcer of other part of right foot limited to breakdown of skin: Secondary | ICD-10-CM | POA: Diagnosis not present

## 2023-11-21 DIAGNOSIS — Z7984 Long term (current) use of oral hypoglycemic drugs: Secondary | ICD-10-CM | POA: Insufficient documentation

## 2023-11-21 DIAGNOSIS — S91311A Laceration without foreign body, right foot, initial encounter: Secondary | ICD-10-CM | POA: Diagnosis not present

## 2023-11-21 DIAGNOSIS — W240XXA Contact with lifting devices, not elsewhere classified, initial encounter: Secondary | ICD-10-CM | POA: Diagnosis not present

## 2023-11-21 DIAGNOSIS — E11621 Type 2 diabetes mellitus with foot ulcer: Secondary | ICD-10-CM | POA: Diagnosis not present

## 2023-11-28 ENCOUNTER — Encounter: Admitting: Physician Assistant

## 2023-11-28 DIAGNOSIS — L97511 Non-pressure chronic ulcer of other part of right foot limited to breakdown of skin: Secondary | ICD-10-CM | POA: Diagnosis not present

## 2023-11-28 DIAGNOSIS — Z7984 Long term (current) use of oral hypoglycemic drugs: Secondary | ICD-10-CM | POA: Diagnosis not present

## 2023-11-28 DIAGNOSIS — S91311A Laceration without foreign body, right foot, initial encounter: Secondary | ICD-10-CM | POA: Diagnosis not present

## 2023-11-28 DIAGNOSIS — Z794 Long term (current) use of insulin: Secondary | ICD-10-CM | POA: Diagnosis not present

## 2023-11-28 DIAGNOSIS — E1143 Type 2 diabetes mellitus with diabetic autonomic (poly)neuropathy: Secondary | ICD-10-CM | POA: Diagnosis not present

## 2023-11-28 DIAGNOSIS — E11621 Type 2 diabetes mellitus with foot ulcer: Secondary | ICD-10-CM | POA: Diagnosis not present

## 2023-11-28 DIAGNOSIS — S91114A Laceration without foreign body of right lesser toe(s) without damage to nail, initial encounter: Secondary | ICD-10-CM | POA: Diagnosis not present

## 2023-11-30 ENCOUNTER — Other Ambulatory Visit: Payer: Self-pay | Admitting: Primary Care

## 2023-11-30 DIAGNOSIS — Z1231 Encounter for screening mammogram for malignant neoplasm of breast: Secondary | ICD-10-CM

## 2023-12-03 DIAGNOSIS — G4733 Obstructive sleep apnea (adult) (pediatric): Secondary | ICD-10-CM | POA: Diagnosis not present

## 2023-12-10 DIAGNOSIS — I890 Lymphedema, not elsewhere classified: Secondary | ICD-10-CM | POA: Diagnosis not present

## 2023-12-11 ENCOUNTER — Other Ambulatory Visit: Payer: Self-pay | Admitting: Primary Care

## 2023-12-11 DIAGNOSIS — M62838 Other muscle spasm: Secondary | ICD-10-CM

## 2023-12-11 DIAGNOSIS — I10 Essential (primary) hypertension: Secondary | ICD-10-CM

## 2023-12-12 ENCOUNTER — Encounter: Admitting: Physician Assistant

## 2023-12-12 DIAGNOSIS — Z7984 Long term (current) use of oral hypoglycemic drugs: Secondary | ICD-10-CM | POA: Diagnosis not present

## 2023-12-12 DIAGNOSIS — S91311A Laceration without foreign body, right foot, initial encounter: Secondary | ICD-10-CM | POA: Diagnosis not present

## 2023-12-12 DIAGNOSIS — E1143 Type 2 diabetes mellitus with diabetic autonomic (poly)neuropathy: Secondary | ICD-10-CM | POA: Diagnosis not present

## 2023-12-12 DIAGNOSIS — L97511 Non-pressure chronic ulcer of other part of right foot limited to breakdown of skin: Secondary | ICD-10-CM | POA: Diagnosis not present

## 2023-12-12 DIAGNOSIS — Z794 Long term (current) use of insulin: Secondary | ICD-10-CM | POA: Diagnosis not present

## 2023-12-12 DIAGNOSIS — I890 Lymphedema, not elsewhere classified: Secondary | ICD-10-CM | POA: Diagnosis not present

## 2023-12-12 DIAGNOSIS — E1165 Type 2 diabetes mellitus with hyperglycemia: Secondary | ICD-10-CM | POA: Diagnosis not present

## 2023-12-12 DIAGNOSIS — E11621 Type 2 diabetes mellitus with foot ulcer: Secondary | ICD-10-CM | POA: Diagnosis not present

## 2023-12-12 DIAGNOSIS — T25222A Burn of second degree of left foot, initial encounter: Secondary | ICD-10-CM | POA: Diagnosis not present

## 2023-12-12 DIAGNOSIS — L97321 Non-pressure chronic ulcer of left ankle limited to breakdown of skin: Secondary | ICD-10-CM | POA: Diagnosis not present

## 2023-12-12 NOTE — Progress Notes (Signed)
 [name]                                          MRN: [id number]   12/12/2023   The [facility] Quality Team Specialist reviewed this patient medical record for the purposes of chart review for care gap closure. The following were reviewed: chart review for care gap closure-kidney health evaluation for diabetes:eGFR  and uACR.    [facility] Quality Team

## 2023-12-19 ENCOUNTER — Encounter: Attending: Physician Assistant | Admitting: Physician Assistant

## 2023-12-19 DIAGNOSIS — L97321 Non-pressure chronic ulcer of left ankle limited to breakdown of skin: Secondary | ICD-10-CM | POA: Insufficient documentation

## 2023-12-19 DIAGNOSIS — E1143 Type 2 diabetes mellitus with diabetic autonomic (poly)neuropathy: Secondary | ICD-10-CM | POA: Diagnosis not present

## 2023-12-19 DIAGNOSIS — I890 Lymphedema, not elsewhere classified: Secondary | ICD-10-CM | POA: Diagnosis not present

## 2023-12-19 DIAGNOSIS — E11621 Type 2 diabetes mellitus with foot ulcer: Secondary | ICD-10-CM | POA: Diagnosis not present

## 2023-12-21 ENCOUNTER — Ambulatory Visit
Admission: RE | Admit: 2023-12-21 | Discharge: 2023-12-21 | Disposition: A | Discharge status: Home / Self Care | Payer: Medicare PPO | Source: Ambulatory Visit | Attending: Primary Care | Admitting: Primary Care

## 2023-12-21 ENCOUNTER — Ambulatory Visit

## 2023-12-21 DIAGNOSIS — Z1231 Encounter for screening mammogram for malignant neoplasm of breast: Secondary | ICD-10-CM

## 2023-12-24 DIAGNOSIS — E1143 Type 2 diabetes mellitus with diabetic autonomic (poly)neuropathy: Secondary | ICD-10-CM | POA: Diagnosis not present

## 2023-12-24 DIAGNOSIS — T25222A Burn of second degree of left foot, initial encounter: Secondary | ICD-10-CM | POA: Diagnosis not present

## 2023-12-24 DIAGNOSIS — E11621 Type 2 diabetes mellitus with foot ulcer: Secondary | ICD-10-CM | POA: Diagnosis not present

## 2023-12-24 DIAGNOSIS — L97321 Non-pressure chronic ulcer of left ankle limited to breakdown of skin: Secondary | ICD-10-CM | POA: Diagnosis not present

## 2023-12-26 ENCOUNTER — Encounter: Admitting: Physician Assistant

## 2023-12-26 DIAGNOSIS — I890 Lymphedema, not elsewhere classified: Secondary | ICD-10-CM | POA: Diagnosis not present

## 2023-12-26 DIAGNOSIS — L97321 Non-pressure chronic ulcer of left ankle limited to breakdown of skin: Secondary | ICD-10-CM | POA: Diagnosis not present

## 2023-12-26 DIAGNOSIS — E11621 Type 2 diabetes mellitus with foot ulcer: Secondary | ICD-10-CM | POA: Diagnosis not present

## 2023-12-26 DIAGNOSIS — E1143 Type 2 diabetes mellitus with diabetic autonomic (poly)neuropathy: Secondary | ICD-10-CM | POA: Diagnosis not present

## 2024-01-01 ENCOUNTER — Other Ambulatory Visit: Payer: Self-pay | Admitting: Primary Care

## 2024-01-01 DIAGNOSIS — F419 Anxiety disorder, unspecified: Secondary | ICD-10-CM

## 2024-01-02 ENCOUNTER — Ambulatory Visit
Admission: RE | Admit: 2024-01-02 | Discharge: 2024-01-02 | Disposition: A | Discharge status: Home / Self Care | Source: Ambulatory Visit | Attending: Primary Care | Admitting: Primary Care

## 2024-01-02 ENCOUNTER — Ambulatory Visit: Admitting: Physician Assistant

## 2024-01-02 DIAGNOSIS — Z1231 Encounter for screening mammogram for malignant neoplasm of breast: Secondary | ICD-10-CM | POA: Diagnosis not present

## 2024-01-02 DIAGNOSIS — M7541 Impingement syndrome of right shoulder: Secondary | ICD-10-CM | POA: Diagnosis not present

## 2024-01-03 ENCOUNTER — Other Ambulatory Visit: Payer: Self-pay | Admitting: Primary Care

## 2024-01-03 ENCOUNTER — Ambulatory Visit: Payer: Self-pay | Admitting: Primary Care

## 2024-01-03 DIAGNOSIS — R928 Other abnormal and inconclusive findings on diagnostic imaging of breast: Secondary | ICD-10-CM

## 2024-01-14 DIAGNOSIS — E1165 Type 2 diabetes mellitus with hyperglycemia: Secondary | ICD-10-CM | POA: Diagnosis not present

## 2024-01-14 DIAGNOSIS — Z794 Long term (current) use of insulin: Secondary | ICD-10-CM | POA: Diagnosis not present

## 2024-01-15 ENCOUNTER — Other Ambulatory Visit

## 2024-01-15 ENCOUNTER — Other Ambulatory Visit: Payer: Self-pay | Admitting: Primary Care

## 2024-01-15 DIAGNOSIS — R000 Tachycardia, unspecified: Secondary | ICD-10-CM

## 2024-01-15 DIAGNOSIS — I10 Essential (primary) hypertension: Secondary | ICD-10-CM

## 2024-01-23 ENCOUNTER — Ambulatory Visit
Admission: RE | Admit: 2024-01-23 | Discharge: 2024-01-23 | Disposition: A | Source: Ambulatory Visit | Attending: Primary Care

## 2024-01-23 ENCOUNTER — Other Ambulatory Visit

## 2024-01-23 DIAGNOSIS — N6489 Other specified disorders of breast: Secondary | ICD-10-CM | POA: Diagnosis not present

## 2024-01-23 DIAGNOSIS — R928 Other abnormal and inconclusive findings on diagnostic imaging of breast: Secondary | ICD-10-CM

## 2024-01-24 ENCOUNTER — Encounter: Payer: Self-pay | Admitting: Primary Care

## 2024-01-24 ENCOUNTER — Ambulatory Visit: Admitting: Primary Care

## 2024-01-24 VITALS — BP 132/84 | HR 77 | Resp 98

## 2024-01-24 DIAGNOSIS — Z23 Encounter for immunization: Secondary | ICD-10-CM

## 2024-01-24 DIAGNOSIS — Z794 Long term (current) use of insulin: Secondary | ICD-10-CM

## 2024-01-24 DIAGNOSIS — R29898 Other symptoms and signs involving the musculoskeletal system: Secondary | ICD-10-CM | POA: Diagnosis not present

## 2024-01-24 DIAGNOSIS — Z0000 Encounter for general adult medical examination without abnormal findings: Secondary | ICD-10-CM

## 2024-01-24 DIAGNOSIS — I10 Essential (primary) hypertension: Secondary | ICD-10-CM | POA: Diagnosis not present

## 2024-01-24 DIAGNOSIS — F419 Anxiety disorder, unspecified: Secondary | ICD-10-CM

## 2024-01-24 DIAGNOSIS — R600 Localized edema: Secondary | ICD-10-CM

## 2024-01-24 DIAGNOSIS — G4733 Obstructive sleep apnea (adult) (pediatric): Secondary | ICD-10-CM | POA: Diagnosis not present

## 2024-01-24 DIAGNOSIS — G894 Chronic pain syndrome: Secondary | ICD-10-CM | POA: Diagnosis not present

## 2024-01-24 DIAGNOSIS — E1140 Type 2 diabetes mellitus with diabetic neuropathy, unspecified: Secondary | ICD-10-CM | POA: Diagnosis not present

## 2024-01-24 DIAGNOSIS — E7800 Pure hypercholesterolemia, unspecified: Secondary | ICD-10-CM | POA: Diagnosis not present

## 2024-01-24 DIAGNOSIS — J4520 Mild intermittent asthma, uncomplicated: Secondary | ICD-10-CM | POA: Diagnosis not present

## 2024-01-24 LAB — MICROALBUMIN / CREATININE URINE RATIO
Creatinine,U: 66.3 mg/dL
Microalb Creat Ratio: UNDETERMINED mg/g (ref 0.0–30.0)
Microalb, Ur: <0.7 mg/dL

## 2024-01-24 LAB — LIPID PANEL
Cholesterol: 142 mg/dL (ref 0–200)
HDL: 56 mg/dL (ref >39.00)
LDL Cholesterol: 47 mg/dL (ref 0–99)
NonHDL: 85.92
Total CHOL/HDL Ratio: 3
Triglycerides: 194 mg/dL — ABNORMAL HIGH (ref 0.0–149.0)
VLDL: 38.8 mg/dL (ref 0.0–40.0)

## 2024-01-24 NOTE — Assessment & Plan Note (Signed)
 Continue BiPAP nightly.

## 2024-01-24 NOTE — Assessment & Plan Note (Signed)
 Repeat lipid panel pending. Continue atorvastatin 40 mg daily.

## 2024-01-24 NOTE — Assessment & Plan Note (Signed)
 Controlled.  Continue Symbicort  80-4.5 mcg 2 puffs twice daily seasonally as needed. Continue albuterol  as needed.  We discussed the difference between rescue inhaler and maintenance inhaler

## 2024-01-24 NOTE — Patient Instructions (Signed)
 Stop by the lab prior to leaving today. I will notify you of your results once received.   You will receive a phone call regarding the referral for physical therapy.  It was a pleasure to see you today!

## 2024-01-24 NOTE — Assessment & Plan Note (Signed)
 Stable.  Continue gabapentin  600 mg twice daily

## 2024-01-24 NOTE — Assessment & Plan Note (Signed)
 Immunizations UTD. Influenza vaccine provided today.  Mammogram UTD Colonoscopy UTD, due 2027  Discussed the importance of a healthy diet and regular exercise in order for weight loss, and to reduce the risk of further co-morbidity.  Exam stable. Labs pending and reviewed  Follow up in 1 year for repeat physical.

## 2024-01-24 NOTE — Assessment & Plan Note (Signed)
 Overall controlled with A1c of 6.5 in October 2025 per endocrinology.  Continue Trulicity  4.5 mg weekly, Tresiba  30 units twice daily, NovoLog  18 to 22 units 3 times daily with meals

## 2024-01-24 NOTE — Progress Notes (Signed)
 Subjective:    Patient ID: [name], female    DOB: 1965/10/29, 58 y.o.   MRN: [id number]  [name] is a very pleasant 58 y.o. female who presents today for complete physical and follow up of chronic conditions.  Over last 6 months she's experienced a decline in physical activity due to her lower extremity edema. She can only stand to transition to her bed. She cannot walk at all due to weakness from deconditioning. She uses the bedpan. She's compliant to the lymphedema pumps.  Immunizations: -Tetanus: Completed in 2022 -Influenza: Influenza vaccine provided today.  -Shingles: Completed Shingrix series -Pneumonia: Completed Pneumovax 23 in 2024  Diet: Fair diet.  Exercise: No regular exercise.  Eye exam: Completes annually  Dental exam: Completed >1 year ago   Pap Smear: Hysterectomy Mammogram: Completed in October 2025  Colonoscopy: Completed in 2022, due 2027  BP Readings from Last 3 Encounters:  01/24/24 132/84  10/21/23 (!) 139/91  10/08/23 127/65         Review of Systems  Constitutional:  Negative for unexpected weight change.  HENT:  Negative for rhinorrhea.   Respiratory:  Negative for cough and shortness of breath.   Cardiovascular:  Negative for chest pain.  Gastrointestinal:  Negative for constipation and diarrhea.  Genitourinary:  Negative for difficulty urinating.  Musculoskeletal:  Negative for arthralgias and myalgias.  Skin:  Negative for rash.  Allergic/Immunologic: Negative for environmental allergies.  Neurological:  Positive for weakness and numbness. Negative for dizziness and headaches.  Psychiatric/Behavioral:  The patient is not nervous/anxious.          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   Anemia    Anxiety    Arthritis    Asthma    Bacteremia 05/09/2022   Cataract    CHF (congestive heart failure) (HCC)     Colon polyp    Complication of anesthesia    Cystitis with hematuria 10/24/2021   Diabetes mellitus    Displaced fracture of proximal end of right fibula 07/21/2017   Edema    Fatigue    Hypertension    IBS (irritable bowel syndrome)    Localized swelling of left foot 04/25/2023   Lump in female breast    Methicillin resistant Staphylococcus aureus in conditions classified elsewhere and of unspecified site    Migraines    Morbid obesity (HCC)    Neuromuscular disorder (HCC)    Night sweats    Nonspecific abnormal results of thyroid  function study    Other B-complex deficiencies    Paresthesias    PONV (postoperative nausea and vomiting)    Pure hypercholesterolemia    Reflux    Sacral fracture (HCC)    Septic shock (HCC) 05/08/2022   Sinus problem    Sleep apnea    Sore on toe (HCC) 05/17/2021   Symptomatic states associated with artificial menopause    Thyroid  disease    Type II or unspecified type diabetes mellitus without mention of complication, not stated as uncontrolled    Unspecified sleep apnea     Social History   Socioeconomic History   Marital status: Married    Spouse name: Not on file   Number of children: 3   Years of education: college   Highest education level: Associate degree: academic program  Occupational History   Occupation: Animal Nutritionist: [employer]  Tobacco Use   Smoking status: Never   Smokeless tobacco: Never  Vaping Use   Vaping status: Never Used  Substance and Sexual Activity   Alcohol use: Not Currently    Comment: Socially   Drug use: No   Sexual activity: Not Currently    Birth control/protection: Other-see comments    Comment: Hysterectomy  Other Topics Concern   Not on file  Social History Narrative    Married x 29 years, happily married; no abuse.    Children: 3 children; 1 grandchild; two step grandchildren; 1 gg.   Lives: with husband, mother-in-law.   Employment:  [employer] x 1996; advice worker.  Loves  work.   Tobacco: never   Alcohol: socially.  One glass of wine per month.    Seatbelt: 100%   Guns: gun safe in garage.   Social Drivers of Corporate Investment Banker Strain: Low Risk  (01/22/2024)   Overall Financial Resource Strain (CARDIA)    Difficulty of Paying Living Expenses: Not very hard  Food Insecurity: No Food Insecurity (01/22/2024)   Hunger Vital Sign    Worried About Running Out of Food in the Last Year: Never true    Ran Out of Food in the Last Year: Never true  Transportation Needs: No Transportation Needs (01/22/2024)   PRAPARE - Administrator, Civil Service (Medical): No    Lack of Transportation (Non-Medical): No  Physical Activity: Inactive (01/22/2024)   Exercise Vital Sign    Days of Exercise per Week: 0 days    Minutes of Exercise per Session: Not on file  Stress: No Stress Concern Present (01/22/2024)   [employer] of Occupational Health - Occupational Stress Questionnaire    Feeling of Stress: Only a little  Social Connections: Moderately Integrated (01/22/2024)   Social Connection and Isolation Panel    Frequency of Communication with Friends and Family: More than three times a week    Frequency of Social Gatherings with Friends and Family: Twice a week    Attends Religious Services: 1 to 4 times per year    Active Member of [employer] or Organizations: No    Attends Banker Meetings: Not on file    Marital Status: Married  Catering Manager Violence: Not At Risk (10/08/2023)   Humiliation, Afraid, Rape, and Kick questionnaire    Fear of Current or Ex-Partner: No    Emotionally Abused: No    Physically Abused: No    Sexually Abused: No    Past Surgical History:  Procedure Laterality Date   abcess removal  03/21/1995   MRSA   ABDOMINAL HYSTERECTOMY  03/21/1995   complete in 1997, partial was in 1995   carpal tunnel release r  07/03/2013   Dalldorf   CATARACT EXTRACTION W/PHACO Left 08/10/2022   Procedure: CATARACT EXTRACTION  PHACO AND INTRAOCULAR LENS PLACEMENT (IOC) LEFT DIABETIC  ASPIRE ENVISTA  LENS  4.39  00:36.5;  Surgeon: Enola Feliciano Hugger, MD;  Location: [facility];  Service: Ophthalmology;  Laterality: Left;   CATARACT EXTRACTION W/PHACO Right 08/24/2022   Procedure: CATARACT EXTRACTION PHACO AND INTRAOCULAR LENS PLACEMENT (IOC) RIGHT DIABETIC  ASPIRE ENVISTA LENS  5.09  00:31.3;  Surgeon: Enola Feliciano Hugger, MD;  Location: [facility];  Service: Ophthalmology;  Laterality: Right;   CESAREAN SECTION  1987  and 1989   CHOLECYSTECTOMY  03/20/1989   COLONOSCOPY  03/21/2011   Normal.  [facility].   KNEE SURGERY  03/20/2004   LAPAROSCOPIC GASTRIC BANDING  05/18/2009   SPINE SURGERY     TONSILLECTOMY AND ADENOIDECTOMY  03/20/1972   TUBAL LIGATION     ULNAR TUNNEL RELEASE Right 02/05/2018   Procedure: CUBITAL TUNNEL RELEASE/DECOMPRESSION;  Surgeon: Sheril Coy, MD;  Location: [facility];  Service: Orthopedics;  Laterality: Right;    Family History  Problem Relation Age of Onset   Diabetes Mother    Heart disease Mother 16       CHF, CAD   Other Mother        muscle disease   Hypertension Mother    Hyperlipidemia Mother    Arthritis Mother        OA hip L s/p THR   Leukemia Mother 47   Kidney disease Mother    Anxiety disorder Mother    COPD Mother    Stroke Mother    Diabetes Father    Heart disease Father        AMI/CABG/valve replacement age 71.   Stroke Father    Hypertension Father    Diabetes Brother    Heart disease Brother        stent age 90.   Hypertension Brother    Obesity Brother    Stroke Brother    Cancer Paternal Aunt    Varicose Veins Paternal Aunt    Alzheimer's disease Maternal Grandmother    Emphysema Maternal Grandfather    Heart disease Paternal Grandmother    Early death Paternal Grandmother    Fibromyalgia Daughter    Crohn's disease Daughter    Asthma Daughter    Obesity Daughter    Asthma Daughter    Hypertension Son    Breast cancer  Other        2 Aunts    Allergies  Allergen Reactions   Amoxicillin Rash, Shortness Of Breath and Other (See Comments)    Patient states she was previously able to tolerate Keflex without issue   Bee Venom Anaphylaxis   Levofloxacin Rash, Shortness Of Breath and Other (See Comments)   Meloxicam  Hives, Rash and Other (See Comments)   Vancomycin Anaphylaxis and Other (See Comments)   Ciprofloxacin  Hives, Itching, Swelling and Other (See Comments)   Sulfa  Antibiotics Rash and Other (See Comments)   Ace Inhibitors Cough and Other (See Comments)   Codeine Other (See Comments)   Meperidine Hcl Other (See Comments)   Tyloxapol     Other reaction(s): vomiting/rash   Penicillins Rash, Swelling and Other (See Comments)    Has patient had a PCN reaction causing immediate rash, facial/tongue/throat swelling, SOB or lightheadedness with hypotension: Yes  Has patient had a PCN reaction causing severe rash involving mucus membranes or skin necrosis: Yes  Has patient had a PCN reaction that required hospitalization: No  Has patient had a PCN reaction occurring within the last 10 years: No  If all of the above answers are NO, then may proceed with Cephalosporin use.   Tequin [Gatifloxacin] Rash    Current Outpatient Medications on File Prior to Visit  Medication Sig Dispense Refill   acetaminophen  (TYLENOL ) 325 MG tablet Take 650 mg by mouth every 6 (six) hours as needed.     albuterol  (PROVENTIL ) (2.5 MG/3ML) 0.083% nebulizer solution Take 3 mLs (2.5 mg total) by nebulization every 6 (six) hours as needed for wheezing or shortness of breath. 75 mL 0   albuterol  (VENTOLIN  HFA) 108 (90 Base) MCG/ACT inhaler Inhale 1-2 puffs into the lungs every 6 (six) hours as needed for wheezing or shortness of breath. 18  g 1   atorvastatin  (LIPITOR) 40 MG tablet TAKE ONE TABLET BY MOUTH ONCE A DAY FOR CHOLESTEROL. 90 tablet 2   Bacillus Coagulans-Inulin (PROBIOTIC-PREBIOTIC PO) Take by mouth.     bacitracin   ointment Apply 1 Application topically 2 (two) times daily. 120 g 0   budesonide  (ENTOCORT EC ) 3 MG 24 hr capsule Take 6 mg by mouth daily.     budesonide -formoterol  (SYMBICORT ) 80-4.5 MCG/ACT inhaler Inhale 2 puffs into the lungs 2 (two) times daily. Rinse after use. 1 each 3   celecoxib  (CELEBREX ) 100 MG capsule Take 1 capsule (100 mg total) by mouth 2 (two) times daily. 30 capsule 0   colestipol (COLESTID) 1 g tablet Take 1 g by mouth 2 (two) times daily. Patient takes 2 tablets BID     Continuous Blood Gluc Receiver (DEXCOM G7 RECEIVER) DEVI by Does not apply route.     Continuous Blood Gluc Sensor (DEXCOM G7 SENSOR) MISC by Does not apply route.     cyclobenzaprine  (FLEXERIL ) 10 MG tablet TAKE ONE TABLET (10 MG TOTAL) BY MOUTH THREE (THREE) TIMES DAILY AS NEEDED FOR MUSCLE SPASMS. 270 tablet 0   Dulaglutide  4.5 MG/0.5ML SOPN Inject into the skin.     EPINEPHrine  (EPIPEN  2-PAK) 0.3 mg/0.3 mL IJ SOAJ injection Inject 0.3 mg into the muscle as needed for anaphylaxis. 1 each 1   ergocalciferol  (VITAMIN D2) 1.25 MG (50000 UT) capsule Take by mouth.     fexofenadine (ALLEGRA) 180 MG tablet 1 tablet     FLUoxetine  (PROZAC ) 40 MG capsule TAKE ONE CAPSULE BY MOUTH ONCE DAILY FOR ANXIETY AND DEPRESSION 90 capsule 0   fluticasone  (FLONASE ) 50 MCG/ACT nasal spray Place 1 spray into both nostrils 2 (two) times daily. 48 mL 1   gabapentin  (NEURONTIN ) 600 MG tablet TAKE ONE TABLET (600 MG TOTAL) BY MOUTH TWO (TWO) TIMES DAILY FOR PAIN 180 tablet 0   hydrochlorothiazide  (HYDRODIURIL ) 12.5 MG tablet TAKE ONE TABLET BY MOUTH ONCE DAILY FOR BLOOD PRESSURE 90 tablet 0   insulin  aspart (NOVOLOG  FLEXPEN) 100 UNIT/ML FlexPen Inject 18-22 Units into the skin 3 (three) times daily with meals.     insulin  degludec (TRESIBA ) 100 UNIT/ML FlexTouch Pen Inject 30 Units into the skin 2 (two) times daily.     Insulin  Pen Needle 32G X 4 MM MISC Use 1 needle with pen as directed 100 each 11   ketoconazole  (NIZORAL ) 2 % cream  Apply 1 Application topically daily as needed for irritation. 30 g 0   levocetirizine (XYZAL ) 5 MG tablet TAKE ONE TABLET BY MOUTH EVERY EVENING 90 tablet 1   losartan  (COZAAR ) 50 MG tablet TAKE ONE TABLET BY MOUTH ONCE DAILY FOR BLOOD PRESSURE 90 tablet 0   metoprolol  tartrate (LOPRESSOR ) 25 MG tablet TAKE A HALF A TABLET (12.5 MG TOTAL) BY MOUTH TWO (TWO) TIMES DAILY FOR BLOOD PRESSURE. 90 tablet 0   Multiple Vitamin (MULTIVITAMIN PO) Take 1 tablet by mouth daily.      Nebulizer MISC Dispense 1 nebulizer for home use, with tubing and supplies. J45.909. 1 each 0   ondansetron  (ZOFRAN -ODT) 4 MG disintegrating tablet TAKE 1 TABLET BY MOUTH EVERY 8 HOURS AS NEEDED FOR NAUSEA AS DIRECTED. 20 tablet 0   VALERIAN ROOT PO Take 1,000 mg by mouth at bedtime.     Current Facility-Administered Medications on File Prior to Visit  Medication Dose Route Frequency Provider Last Rate Last Admin   silver  sulfADIAZINE  (SILVADENE ) 1 % cream 1 Application  1 Application Topical Once  Wendee Lynwood HERO, NP        BP 132/84   Pulse 77   Resp (!) 98  Objective:   Physical Exam HENT:     Right Ear: Tympanic membrane and ear canal normal.     Left Ear: Tympanic membrane and ear canal normal.  Eyes:     Pupils: Pupils are equal, round, and reactive to light.  Cardiovascular:     Rate and Rhythm: Normal rate and regular rhythm.  Pulmonary:     Effort: Pulmonary effort is normal.     Breath sounds: Normal breath sounds.  Abdominal:     General: Bowel sounds are normal.     Palpations: Abdomen is soft.     Tenderness: There is no abdominal tenderness.  Musculoskeletal:     Cervical back: Neck supple.     Comments: In wheelchair today. Good strength with lower extremity flexion and extension while seated.  Skin:    General: Skin is warm and dry.     Comments: Evidence of senile purpura to bilateral upper extremities  Neurological:     Mental Status: She is alert and oriented to person, place, and time.      Cranial Nerves: No cranial nerve deficit.     Deep Tendon Reflexes:     Reflex Scores:      Patellar reflexes are 2+ on the right side and 2+ on the left side. Psychiatric:        Mood and Affect: Mood normal.     Physical Exam        Assessment & Plan:  Need for influenza vaccination -     Flu vaccine trivalent PF, 6mos and older(Flulaval,Afluria,Fluarix,Fluzone)  Type 2 diabetes mellitus with diabetic neuropathy, with long-term current use of insulin  [facility]) Assessment & Plan: Overall controlled with A1c of 6.5 in October 2025 per endocrinology.  Continue Trulicity  4.5 mg weekly, Tresiba  30 units twice daily, NovoLog  18 to 22 units 3 times daily with meals  Orders: -     Microalbumin / creatinine urine ratio  Pure hypercholesterolemia Assessment & Plan: Repeat lipid panel pending. Continue atorvastatin  40 mg daily  Orders: -     Lipid panel  Weakness of both lower extremities Assessment & Plan: Agree for home health physical therapy as she is at high risk for falls from lower extremity weakness. Orders placed.  Orders: -     Ambulatory referral to Home Health  Essential hypertension Assessment & Plan: Stable.  Continue hydrochlorothiazide  12.5 mg daily, metoprolol  12.5 mg twice daily, losartan  50 mg daily. Reviewed labs from April 2025   OSA (obstructive sleep apnea) Assessment & Plan: Continue BiPAP nightly.   Mild intermittent asthma without complication Assessment & Plan: Controlled.  Continue Symbicort  80-4.5 mcg 2 puffs twice daily seasonally as needed. Continue albuterol  as needed.  We discussed the difference between rescue inhaler and maintenance inhaler   Routine general medical examination at a health care facility Assessment & Plan: Immunizations UTD. Influenza vaccine provided today.  Mammogram UTD Colonoscopy UTD, due 2027  Discussed the importance of a healthy diet and regular exercise in order for weight loss, and to reduce the  risk of further co-morbidity.  Exam stable. Labs pending and reviewed  Follow up in 1 year for repeat physical.    Chronic pain syndrome Assessment & Plan: Stable.  Continue gabapentin  600 mg twice daily   Bilateral lower extremity edema Assessment & Plan: Improved  Continue lymphedema pumps daily. Follow with vascular services  Anxiety Assessment & Plan: Controlled.  Continue fluoxetine  40 mg daily     Assessment and Plan Assessment & Plan         Comer MARLA Gaskins, NP     History of Present Illness

## 2024-01-24 NOTE — Assessment & Plan Note (Signed)
 Agree for home health physical therapy as she is at high risk for falls from lower extremity weakness. Orders placed.

## 2024-01-24 NOTE — Assessment & Plan Note (Addendum)
 Stable.  Continue hydrochlorothiazide  12.5 mg daily, metoprolol  12.5 mg twice daily, losartan  50 mg daily. Reviewed labs from April 2025

## 2024-01-24 NOTE — Assessment & Plan Note (Signed)
 Controlled.  ?Continue fluoxetine 40 mg daily. ?

## 2024-01-24 NOTE — Assessment & Plan Note (Signed)
 Improved  Continue lymphedema pumps daily. Follow with vascular services

## 2024-01-25 ENCOUNTER — Ambulatory Visit: Payer: Self-pay | Admitting: Primary Care

## 2024-02-04 ENCOUNTER — Encounter (INDEPENDENT_AMBULATORY_CARE_PROVIDER_SITE_OTHER): Payer: Self-pay | Admitting: Nurse Practitioner

## 2024-02-04 ENCOUNTER — Ambulatory Visit (INDEPENDENT_AMBULATORY_CARE_PROVIDER_SITE_OTHER): Admitting: Nurse Practitioner

## 2024-02-04 VITALS — BP 188/81 | HR 77 | Resp 18

## 2024-02-04 DIAGNOSIS — R21 Rash and other nonspecific skin eruption: Secondary | ICD-10-CM | POA: Diagnosis not present

## 2024-02-04 DIAGNOSIS — I890 Lymphedema, not elsewhere classified: Secondary | ICD-10-CM | POA: Diagnosis not present

## 2024-02-04 MED ORDER — NYSTATIN 100000 UNIT/GM EX POWD: 1.0000 | Freq: Three times a day (TID) | CUTANEOUS | 3 refills | Status: AC

## 2024-02-04 NOTE — Progress Notes (Signed)
 SUBJECTIVE:  Patient ID: [name], female    DOB: May 08, 1965, 58 y.o.   MRN: [id number] Chief Complaint  Patient presents with   Follow-up    6 month follow up    Discussed the use of AI scribe software for clinical note transcription with the patient, who gave verbal consent to proceed.  History of Present Illness [name] Lorn is a 58 year old female with lymphedema who presents with widespread blistering and swelling.  She has widespread blistering and swelling, described as water blisters on the back of her legs, under her buttocks, chest, and arms. These blisters rupture and drain significant fluid. Previous healthcare providers have attributed these blisters to friction and lymphedema. She has not consulted a dermatologist for these skin issues.  She has a history of lymphedema, managed with pumps and wraps. She uses the pumps twice daily, which initially increased urination, but this effect has diminished. Despite pump use, she continues to experience swelling, particularly in her lower extremities, though less severe than before. Previously, the swelling was so severe that it impaired her ability to walk and breathe comfortably.  She reports a recent increase in breathing difficulties. No use of diuretics. Her partner notes that her bed has been wet around the knee areas, suggesting leakage from blisters or swelling.  In the past, she has seen heart and lung specialists following a severe episode of sepsis, but she did not require a kidney specialist as her kidney function improved. Recent tests showed no protein in her urine.    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   Anemia    Anxiety    Arthritis    Asthma    Bacteremia 05/09/2022   Cataract    CHF (congestive heart failure) (HCC)    Colon polyp    Complication of anesthesia    Cystitis with  hematuria 10/24/2021   Diabetes mellitus    Displaced fracture of proximal end of right fibula 07/21/2017   Edema    Fatigue    Hypertension    IBS (irritable bowel syndrome)    Localized swelling of left foot 04/25/2023   Lump in female breast    Methicillin resistant Staphylococcus aureus in conditions classified elsewhere and of unspecified site    Migraines    Morbid obesity (HCC)    Neuromuscular disorder (HCC)    Night sweats    Nonspecific abnormal results of thyroid  function study    Other B-complex deficiencies    Paresthesias    PONV (postoperative nausea and vomiting)    Pure hypercholesterolemia    Reflux    Sacral fracture (HCC)    Septic shock (HCC) 05/08/2022   Sinus problem    Sleep apnea    Sore on toe (HCC) 05/17/2021   Symptomatic states associated with artificial menopause    Thyroid  disease    Type II or unspecified type diabetes mellitus without mention of complication, not stated as uncontrolled    Unspecified sleep apnea     Past Surgical History:  Procedure Laterality Date   abcess removal  03/21/1995   MRSA   ABDOMINAL HYSTERECTOMY  03/21/1995   complete in 1997, partial was in 1995   carpal tunnel release r  07/03/2013   Dalldorf   CATARACT EXTRACTION W/PHACO Left 08/10/2022   Procedure: CATARACT EXTRACTION PHACO AND INTRAOCULAR LENS PLACEMENT (IOC) LEFT DIABETIC  ASPIRE  ENVISTA  LENS  4.39  00:36.5;  Surgeon: Enola Feliciano Hugger, MD;  Location: [facility];  Service: Ophthalmology;  Laterality: Left;   CATARACT EXTRACTION W/PHACO Right 08/24/2022   Procedure: CATARACT EXTRACTION PHACO AND INTRAOCULAR LENS PLACEMENT (IOC) RIGHT DIABETIC  ASPIRE ENVISTA LENS  5.09  00:31.3;  Surgeon: Enola Feliciano Hugger, MD;  Location: [facility];  Service: Ophthalmology;  Laterality: Right;   CESAREAN SECTION  1987  and 1989   CHOLECYSTECTOMY  03/20/1989   COLONOSCOPY  03/21/2011   Normal.  [facility].   KNEE SURGERY  03/20/2004    LAPAROSCOPIC GASTRIC BANDING  05/18/2009   SPINE SURGERY     TONSILLECTOMY AND ADENOIDECTOMY  03/20/1972   TUBAL LIGATION     ULNAR TUNNEL RELEASE Right 02/05/2018   Procedure: CUBITAL TUNNEL RELEASE/DECOMPRESSION;  Surgeon: Sheril Coy, MD;  Location: [facility];  Service: Orthopedics;  Laterality: Right;    Social History   Socioeconomic History   Marital status: Married    Spouse name: Not on file   Number of children: 3   Years of education: college   Highest education level: Associate degree: academic program  Occupational History   Occupation: Animal Nutritionist: [employer]  Tobacco Use   Smoking status: Never   Smokeless tobacco: Never  Vaping Use   Vaping status: Never Used  Substance and Sexual Activity   Alcohol use: Not Currently    Comment: Socially   Drug use: No   Sexual activity: Not Currently    Birth control/protection: Other-see comments    Comment: Hysterectomy  Other Topics Concern   Not on file  Social History Narrative    Married x 29 years, happily married; no abuse.    Children: 3 children; 1 grandchild; two step grandchildren; 1 gg.   Lives: with husband, mother-in-law.   Employment:  [employer] x 1996; advice worker.  Loves work.   Tobacco: never   Alcohol: socially.  One glass of wine per month.    Seatbelt: 100%   Guns: gun safe in garage.   Social Drivers of Corporate Investment Banker Strain: Low Risk  (01/22/2024)   Overall Financial Resource Strain (CARDIA)    Difficulty of Paying Living Expenses: Not very hard  Food Insecurity: No Food Insecurity (01/22/2024)   Hunger Vital Sign    Worried About Running Out of Food in the Last Year: Never true    Ran Out of Food in the Last Year: Never true  Transportation Needs: No Transportation Needs (01/22/2024)   PRAPARE - Administrator, Civil Service (Medical): No    Lack of Transportation (Non-Medical): No  Physical Activity: Inactive (01/22/2024)   Exercise Vital Sign    Days  of Exercise per Week: 0 days    Minutes of Exercise per Session: Not on file  Stress: No Stress Concern Present (01/22/2024)   [employer] of Occupational Health - Occupational Stress Questionnaire    Feeling of Stress: Only a little  Social Connections: Moderately Integrated (01/22/2024)   Social Connection and Isolation Panel    Frequency of Communication with Friends and Family: More than three times a week    Frequency of Social Gatherings with Friends and Family: Twice a week    Attends Religious Services: 1 to 4 times per year    Active Member of [employer] or Organizations: No    Attends Engineer, Structural: Not on file    Marital Status: Married  Catering Manager Violence:  Not At Risk (10/08/2023)   Humiliation, Afraid, Rape, and Kick questionnaire    Fear of Current or Ex-Partner: No    Emotionally Abused: No    Physically Abused: No    Sexually Abused: No    Family History  Problem Relation Age of Onset   Diabetes Mother    Heart disease Mother 7       CHF, CAD   Other Mother        muscle disease   Hypertension Mother    Hyperlipidemia Mother    Arthritis Mother        OA hip L s/p THR   Leukemia Mother 87   Kidney disease Mother    Anxiety disorder Mother    COPD Mother    Stroke Mother    Diabetes Father    Heart disease Father        AMI/CABG/valve replacement age 65.   Stroke Father    Hypertension Father    Diabetes Brother    Heart disease Brother        stent age 6.   Hypertension Brother    Obesity Brother    Stroke Brother    Cancer Paternal Aunt    Varicose Veins Paternal Aunt    Alzheimer's disease Maternal Grandmother    Emphysema Maternal Grandfather    Heart disease Paternal Grandmother    Early death Paternal Grandmother    Fibromyalgia Daughter    Crohn's disease Daughter    Asthma Daughter    Obesity Daughter    Asthma Daughter    Hypertension Son    Breast cancer Other        2 Aunts    Allergies  Allergen  Reactions   Amoxicillin Rash, Shortness Of Breath and Other (See Comments)    Patient states she was previously able to tolerate Keflex without issue   Bee Venom Anaphylaxis   Levofloxacin Rash, Shortness Of Breath and Other (See Comments)   Meloxicam  Hives, Rash and Other (See Comments)   Vancomycin Anaphylaxis and Other (See Comments)   Ciprofloxacin  Hives, Itching, Swelling and Other (See Comments)   Sulfa  Antibiotics Rash and Other (See Comments)   Ace Inhibitors Cough and Other (See Comments)   Codeine Other (See Comments)   Meperidine Hcl Other (See Comments)   Tyloxapol     Other reaction(s): vomiting/rash   Penicillins Rash, Swelling and Other (See Comments)    Has patient had a PCN reaction causing immediate rash, facial/tongue/throat swelling, SOB or lightheadedness with hypotension: Yes  Has patient had a PCN reaction causing severe rash involving mucus membranes or skin necrosis: Yes  Has patient had a PCN reaction that required hospitalization: No  Has patient had a PCN reaction occurring within the last 10 years: No  If all of the above answers are NO, then may proceed with Cephalosporin use.   Tequin [Gatifloxacin] Rash     Review of Systems   Review of Systems: Negative Unless Checked Constitutional: [] Weight loss  [] Fever  [] Chills Cardiac: [] Chest pain   []  Atrial Fibrillation  [] Palpitations   [x] Shortness of breath when laying flat   [x] Shortness of breath with exertion. [] Shortness of breath at rest Vascular:  [] Pain in legs with walking   [] Pain in legs with standing [] Pain in legs when laying flat   [] Claudication    [] Pain in feet when laying flat    [] History of DVT   [] Phlebitis   [x] Swelling in legs   [] Varicose veins   [] Non-healing ulcers  Pulmonary:   [] Uses home oxygen   [] Productive cough   [] Hemoptysis   [] Wheeze  [] COPD   [] Asthma Neurologic:  [] Dizziness   [] Seizures  [] Blackouts [] History of stroke   [] History of TIA  [] Aphasia   [] Temporary  Blindness   [] Weakness or numbness in arm   [] Weakness or numbness in leg Musculoskeletal:   [] Joint swelling   [] Joint pain   [] Low back pain  []  History of Knee Replacement [] Arthritis [] back Surgeries  []  Spinal Stenosis    Hematologic:  [] Easy bruising  [] Easy bleeding   [] Hypercoagulable state   [] Anemic Gastrointestinal:  [] Diarrhea   [] Vomiting  [] Gastroesophageal reflux/heartburn   [] Difficulty swallowing. [] Abdominal pain Genitourinary:  [] Chronic kidney disease   [] Difficult urination  [] Anuric   [] Blood in urine [] Frequent urination  [] Burning with urination   [] Hematuria Skin:  [] Rashes   [] Ulcers [x] Wounds Psychological:  [] History of anxiety   []  History of major depression  []  Memory Difficulties      OBJECTIVE:   Physical Exam  BP (!) 188/81 (BP Location: Right Arm)   Pulse 77   Resp 18    Physical Exam SKIN: Redness and irritation in the knee crease area. Gen: WD/WN, NAD Head: [place]/AT, No temporalis wasting.  Ear/Nose/Throat: Hearing grossly intact, nares w/o erythema or drainage Eyes: PER, EOMI, sclera nonicteric.  Neck: Supple, no masses.  No JVD.  Pulmonary:  Good air movement, no use of accessory muscles.  Cardiac: RRR Vascular: 2+ edema bilaterally  Vessel Right Left  Radial Palpable Palpable   Gastrointestinal: soft, non-distended. No guarding/no peritoneal signs.  Neurologic: Pain and light touch intact in extremities.  Symmetrical.  Speech is fluent. Motor exam as listed above. Psychiatric: Judgment intact, Mood & affect appropriate for pt's clinical situation. Dermatologic: Redness in popliteal space, suspect yeast. Multiple wounds from blisters    CMP     Component Value Date/Time   NA 143 07/03/2023 1332   K 4.3 07/03/2023 1332   CL 102 07/03/2023 1332   CO2 31 07/03/2023 1332   GLUCOSE 188 (H) 07/03/2023 1332   BUN 27 (H) 07/03/2023 1332   CREATININE 1.05 07/03/2023 1332   CREATININE 1.07 (H) 05/19/2022 1554   CALCIUM  9.1 07/03/2023 1332    PROT 6.1 07/03/2023 1332   ALBUMIN 3.7 07/03/2023 1332   AST 23 07/03/2023 1332   ALT 37 (H) 07/03/2023 1332   ALKPHOS 54 07/03/2023 1332   BILITOT 0.4 07/03/2023 1332   GFR 58.35 (L) 07/03/2023 1332   GFRNONAA 57 (L) 06/14/2022 1344   GFRNONAA >89 01/12/2014 1341    No results found.     ASSESSMENT AND PLAN:  1. Lymphedema (Primary) Chronic lymphedema with significant lower extremity swelling and blisters. Swelling improved with current management. Blisters in atypical locations suggest possible systemic component. Differential includes cardiac, renal, hepatic issues, or fluid overload. - Continue pumps and wraps twice daily. - Consider extending pump coverage to abdomen. - Discuss with primary care physician regarding systemic causes and further testing. - Monitor for worsening swelling, blisters, or leakage.  2. Rash and nonspecific skin eruption Widespread blistering skin eruptions not typical for lymphedema, occurring in atypical locations. Differential includes dermatological causes or systemic fluid overload. Previous evaluations inconclusive. - Referred to dermatologist for evaluation. - Took photographs of blisters. - Prescribed nystatin  powder for suspected yeast behind knee  - Ambulatory referral to Dermatology .      Current Outpatient Medications on File Prior to Visit  Medication Sig Dispense Refill   acetaminophen  (TYLENOL )  325 MG tablet Take 650 mg by mouth every 6 (six) hours as needed.     albuterol  (PROVENTIL ) (2.5 MG/3ML) 0.083% nebulizer solution Take 3 mLs (2.5 mg total) by nebulization every 6 (six) hours as needed for wheezing or shortness of breath. 75 mL 0   albuterol  (VENTOLIN  HFA) 108 (90 Base) MCG/ACT inhaler Inhale 1-2 puffs into the lungs every 6 (six) hours as needed for wheezing or shortness of breath. 18 g 1   atorvastatin  (LIPITOR) 40 MG tablet TAKE ONE TABLET BY MOUTH ONCE A DAY FOR CHOLESTEROL. 90 tablet 2   Bacillus Coagulans-Inulin  (PROBIOTIC-PREBIOTIC PO) Take by mouth.     bacitracin  ointment Apply 1 Application topically 2 (two) times daily. 120 g 0   budesonide  (ENTOCORT EC ) 3 MG 24 hr capsule Take 6 mg by mouth daily.     budesonide -formoterol  (SYMBICORT ) 80-4.5 MCG/ACT inhaler Inhale 2 puffs into the lungs 2 (two) times daily. Rinse after use. 1 each 3   celecoxib  (CELEBREX ) 100 MG capsule Take 1 capsule (100 mg total) by mouth 2 (two) times daily. 30 capsule 0   colestipol (COLESTID) 1 g tablet Take 1 g by mouth 2 (two) times daily. Patient takes 2 tablets BID     Continuous Blood Gluc Receiver (DEXCOM G7 RECEIVER) DEVI by Does not apply route.     Continuous Blood Gluc Sensor (DEXCOM G7 SENSOR) MISC by Does not apply route.     cyclobenzaprine  (FLEXERIL ) 10 MG tablet TAKE ONE TABLET (10 MG TOTAL) BY MOUTH THREE (THREE) TIMES DAILY AS NEEDED FOR MUSCLE SPASMS. 270 tablet 0   Dulaglutide  4.5 MG/0.5ML SOPN Inject into the skin.     EPINEPHrine  (EPIPEN  2-PAK) 0.3 mg/0.3 mL IJ SOAJ injection Inject 0.3 mg into the muscle as needed for anaphylaxis. 1 each 1   ergocalciferol  (VITAMIN D2) 1.25 MG (50000 UT) capsule Take by mouth.     fexofenadine (ALLEGRA) 180 MG tablet 1 tablet     FLUoxetine  (PROZAC ) 40 MG capsule TAKE ONE CAPSULE BY MOUTH ONCE DAILY FOR ANXIETY AND DEPRESSION 90 capsule 0   fluticasone  (FLONASE ) 50 MCG/ACT nasal spray Place 1 spray into both nostrils 2 (two) times daily. 48 mL 1   gabapentin  (NEURONTIN ) 600 MG tablet TAKE ONE TABLET (600 MG TOTAL) BY MOUTH TWO (TWO) TIMES DAILY FOR PAIN 180 tablet 0   hydrochlorothiazide  (HYDRODIURIL ) 12.5 MG tablet TAKE ONE TABLET BY MOUTH ONCE DAILY FOR BLOOD PRESSURE 90 tablet 0   insulin  aspart (NOVOLOG  FLEXPEN) 100 UNIT/ML FlexPen Inject 18-22 Units into the skin 3 (three) times daily with meals.     insulin  degludec (TRESIBA ) 100 UNIT/ML FlexTouch Pen Inject 30 Units into the skin 2 (two) times daily.     Insulin  Pen Needle 32G X 4 MM MISC Use 1 needle with pen as  directed 100 each 11   ketoconazole  (NIZORAL ) 2 % cream Apply 1 Application topically daily as needed for irritation. 30 g 0   levocetirizine (XYZAL ) 5 MG tablet TAKE ONE TABLET BY MOUTH EVERY EVENING 90 tablet 1   losartan  (COZAAR ) 50 MG tablet TAKE ONE TABLET BY MOUTH ONCE DAILY FOR BLOOD PRESSURE 90 tablet 0   metoprolol  tartrate (LOPRESSOR ) 25 MG tablet TAKE A HALF A TABLET (12.5 MG TOTAL) BY MOUTH TWO (TWO) TIMES DAILY FOR BLOOD PRESSURE. 90 tablet 0   Multiple Vitamin (MULTIVITAMIN PO) Take 1 tablet by mouth daily.      Nebulizer MISC Dispense 1 nebulizer for home use, with tubing and supplies. J45.909.  1 each 0   ondansetron  (ZOFRAN -ODT) 4 MG disintegrating tablet TAKE 1 TABLET BY MOUTH EVERY 8 HOURS AS NEEDED FOR NAUSEA AS DIRECTED. 20 tablet 0   VALERIAN ROOT PO Take 1,000 mg by mouth at bedtime.     Current Facility-Administered Medications on File Prior to Visit  Medication Dose Route Frequency Provider Last Rate Last Admin   silver  sulfADIAZINE  (SILVADENE ) 1 % cream 1 Application  1 Application Topical Once Cable, James M, NP        There are no Patient Instructions on file for this visit. No follow-ups on file.   Britlyn Martine E Roseland Braun, NP  This note was completed with Office Manager.  Any errors are purely unintentional.

## 2024-02-07 DIAGNOSIS — G894 Chronic pain syndrome: Secondary | ICD-10-CM

## 2024-02-07 DIAGNOSIS — R296 Repeated falls: Secondary | ICD-10-CM

## 2024-02-07 DIAGNOSIS — R29898 Other symptoms and signs involving the musculoskeletal system: Secondary | ICD-10-CM

## 2024-02-07 DIAGNOSIS — R600 Localized edema: Secondary | ICD-10-CM

## 2024-02-07 DIAGNOSIS — Z993 Dependence on wheelchair: Secondary | ICD-10-CM

## 2024-02-20 ENCOUNTER — Ambulatory Visit

## 2024-02-20 ENCOUNTER — Other Ambulatory Visit

## 2024-02-20 ENCOUNTER — Ambulatory Visit: Payer: Self-pay | Admitting: Primary Care

## 2024-02-20 DIAGNOSIS — I890 Lymphedema, not elsewhere classified: Secondary | ICD-10-CM | POA: Diagnosis not present

## 2024-02-20 DIAGNOSIS — R21 Rash and other nonspecific skin eruption: Secondary | ICD-10-CM | POA: Diagnosis not present

## 2024-02-20 DIAGNOSIS — R600 Localized edema: Secondary | ICD-10-CM

## 2024-02-20 DIAGNOSIS — D692 Other nonthrombocytopenic purpura: Secondary | ICD-10-CM

## 2024-02-20 DIAGNOSIS — L309 Dermatitis, unspecified: Secondary | ICD-10-CM

## 2024-02-20 DIAGNOSIS — M793 Panniculitis, unspecified: Secondary | ICD-10-CM

## 2024-02-20 LAB — COMPREHENSIVE METABOLIC PANEL WITH GFR
ALT: 34 U/L (ref 0–35)
AST: 32 U/L (ref 0–37)
Albumin: 3.5 g/dL (ref 3.5–5.2)
Alkaline Phosphatase: 44 U/L (ref 39–117)
BUN: 24 mg/dL — ABNORMAL HIGH (ref 6–23)
CO2: 29 meq/L (ref 19–32)
Calcium: 9.1 mg/dL (ref 8.4–10.5)
Chloride: 102 meq/L (ref 96–112)
Creatinine, Ser: 0.99 mg/dL (ref 0.40–1.20)
GFR: 62.34 mL/min (ref >60.00)
Glucose, Bld: 164 mg/dL — ABNORMAL HIGH (ref 70–99)
Potassium: 4.4 meq/L (ref 3.5–5.1)
Sodium: 141 meq/L (ref 135–145)
Total Bilirubin: 0.5 mg/dL (ref 0.2–1.2)
Total Protein: 6.2 g/dL (ref 6.0–8.3)

## 2024-02-20 LAB — BRAIN NATRIURETIC PEPTIDE: Pro B Natriuretic peptide (BNP): 30 pg/mL (ref 0.0–100.0)

## 2024-02-20 MED ORDER — TRIAMCINOLONE ACETONIDE 0.1 % EX OINT: TOPICAL_OINTMENT | CUTANEOUS | 5 refills | Status: AC

## 2024-02-20 NOTE — Progress Notes (Signed)
    Subjective   [name] is a 58 y.o. female who presents for the following: Rash. Patient is new patient  Today patient reports: Patient states blisters and dark pigmentation on whole body sometimes itch, fill with fluid. Her arm blisters tend to bleed, has been occurring since June patient thought they were shingles. Patient has Lipidemia and is currently being treated.  Review of Systems:    No other skin or systemic complaints except as noted in HPI or Assessment and Plan.  The following portions of the chart were reviewed this encounter and updated as appropriate: medications, allergies, medical history  Relevant Medical History:  Personal history of melanoma - see medical history for full details  (mother, grandmother, and aunt)  Objective  (SKPE) Well appearing patient in no apparent distress; mood and affect are within normal limits. Examination was performed of the: Focused Exam of: Chest, upper/lower extremities    Examination notable for: L chest with faint pink scaly plaque Upper arms with purpura  L popliteal fossa with firm pink papules  Examination limited by: Undergarments, Shoes or socks , and Clothing     Assessment & Plan  (SKAP)   Erythematous, faintly scaly plaque of L chest - undx w/ uncertain prognosis  - appears to be resolving today. Discussed DDX of eczema vs irritant dermatitis vs BP vs other autoimmune. Discussed biopsy, potential DIF when more flared  start triamcinolone  ointment 0.1% twice daily to affected areas of skin Discussed side effect of potent topical steroids including atrophy, dyspigmentation, striae, telangectasia, folliculitis, loss of skin pigment, hair growth, tachyphylaxis, risk of systemic absorption with missuse.  Actinic/Solar purpura - explained relationship between sun exposure and loss of integrity of skin, and how minor trauma can cause noticeable bruising Counseled on the benign nature of this condition Counseled  regarding sun protection including consistent use of daily sunscreen (SPF 30 or greater, broad spectrum, water resistant, reapply every 2-4 hours), sun protective clothing and sun avoidance during peak hours (10 AM-3PM). Advised to avoid trauma as much as possible Use protective clothing. May use OTC arnica bruise gel, vitamin K infused moisturizers, recommend DerMend moisturizing bruise formula cream  Pink firm papules of L popliteal fossa - favor secondary to Lymphedema / lipodermatosclerosis  -Recommend elevation, may continue using lymphedema pumps.   Level of service outlined above   Patient instructions (SKPI)   Procedures, orders, diagnosis for this visit:    There are no diagnoses linked to this encounter.  Return to clinic: Return for PRN, w/ Dr. Raymund.  I, Almetta Nora, RMA, am acting as scribe for Lauraine JAYSON Raymund, MD .   Documentation: I have reviewed the above documentation for accuracy and completeness, and I agree with the above.  Lauraine JAYSON Raymund, MD

## 2024-02-20 NOTE — Patient Instructions (Addendum)
 Start triamcinolone  ointment 0.1% twice daily to affected areas of skin when red/itchy/scaly.  Steroid Use  We prescribed you a topical steroid at today's visit.   General application instructions: -Apply this to any affected skin areas, twice (2 times) daily, for two (2) weeks -If the areas are better, you can stop -Re-start the topical steroid if the areas come back, or flare -If the areas don't get better after two weeks, we sometimes recommend taking a break for one (1) week, before restarting for another two (2) weeks. Repeat as needed  The most common side effects of topical steroid medications include changes in skin pigment and thinning of the skin. If the steroid is only applied to affected areas of the skin, these effects rarely occur unless the steroid is used for a very long time (years without stopping).   If we prescribed you a strong steroid, please avoid applying to face, groin, or neck, unless we tell you otherwise. We will include more detail in your prescription instructions.    Due to recent changes in healthcare laws, you may see results of your pathology and/or laboratory studies on MyChart before the doctors have had a chance to review them. We understand that in some cases there may be results that are confusing or concerning to you. Please understand that not all results are received at the same time and often the doctors may need to interpret multiple results in order to provide you with the best plan of care or course of treatment. Therefore, we ask that you please give us  2 business days to thoroughly review all your results before contacting the office for clarification. Should we see a critical lab result, you will be contacted sooner.   If You Need Anything After Your Visit  If you have any questions or concerns for your doctor, please call our main line at [phone] and press option 4 to reach your doctor's medical assistant. If no one answers, please  leave a voicemail as directed and we will return your call as soon as possible. Messages left after 4 pm will be answered the following business day.   You may also send us  a message via MyChart. We typically respond to MyChart messages within 1-2 business days.  For prescription refills, please ask your pharmacy to contact our office. Our fax number is [phone].  If you have an urgent issue when the clinic is closed that cannot wait until the next business day, you can page your doctor at the number below.    Please note that while we do our best to be available for urgent issues outside of office hours, we are not available 24/7.   If you have an urgent issue and are unable to reach us , you may choose to seek medical care at your doctor's office, retail clinic, urgent care center, or emergency room.  If you have a medical emergency, please immediately call 911 or go to the emergency department.  Pager Numbers  - Dr. Hester: [phone]  - Dr. Jackquline: [phone]  - Dr. Claudene: [phone]   - Dr. Raymund: [phone]  In the event of inclement weather, please call our main line at [phone] for an update on the status of any delays or closures.  Dermatology Medication Tips: Please keep the boxes that topical medications come in in order to help keep track of the instructions about where and how to use these. Pharmacies typically print the medication instructions only on the boxes and not directly on  the medication tubes.   If your medication is too expensive, please contact our office at [phone] option 4 or send us  a message through MyChart.   We are unable to tell what your co-pay for medications will be in advance as this is different depending on your insurance coverage. However, we may be able to find a substitute medication at lower cost or fill out paperwork to get insurance to cover a needed medication.   If a prior authorization is required to get your  medication covered by your insurance company, please allow us  1-2 business days to complete this process.  Drug prices often vary depending on where the prescription is filled and some pharmacies may offer cheaper prices.  The website www.goodrx.com contains coupons for medications through different pharmacies. The prices here do not account for what the cost may be with help from insurance (it may be cheaper with your insurance), but the website can give you the price if you did not use any insurance.  - You can print the associated coupon and take it with your prescription to the pharmacy.  - You may also stop by our office during regular business hours and pick up a GoodRx coupon card.  - If you need your prescription sent electronically to a different pharmacy, notify our office through [facility] or by phone at [phone] option 4.     Si Usted Necesita Algo Despus de Su Visita  Tambin puede enviarnos un mensaje a travs de Clinical Cytogeneticist. Por lo general respondemos a los mensajes de MyChart en el transcurso de 1 a 2 das hbiles.  Para renovar recetas, por favor pida a su farmacia que se ponga en contacto con nuestra oficina. Randi lakes de fax es [place] [phone].  Si tiene un asunto urgente cuando la clnica est cerrada y que no puede esperar hasta el siguiente da hbil, puede llamar/localizar a su doctor(a) al nmero que aparece a continuacin.   Por favor, tenga en cuenta que aunque hacemos todo lo posible para estar disponibles para asuntos urgentes fuera del horario de [place], no estamos disponibles las 24 horas del da, los 7 [address]  [address] de la [place].   Si tiene un problema urgente y no puede comunicarse con nosotros, puede optar por buscar atencin mdica  en el consultorio de su doctor(a), en una clnica privada, en un centro de atencin urgente o en una sala de emergencias.  Si tiene engineer, drilling, por favor llame inmediatamente al 911 o vaya a la sala de  emergencias.  Nmeros de bper  - Dr. Hester: [phone]  - Dra. Jackquline: [phone]  - Dr. Claudene: [phone]  - Dra. Kitts: [phone]  En caso de inclemencias del [place], por favor llame a nuestra lnea principal al [phone] para una actualizacin sobre el estado de cualquier retraso o cierre.  Consejos para la medicacin en dermatologa: Por favor, guarde las cajas en las que vienen los medicamentos de uso tpico para ayudarle a seguir las instrucciones sobre dnde y cmo usarlos. Las farmacias generalmente imprimen las instrucciones del medicamento slo en las cajas y no directamente en los tubos del [place].   Si su medicamento es muy caro, por favor, pngase en contacto con landry rieger llamando al [phone] y presione la opcin 4 o envenos un mensaje a travs de Clinical Cytogeneticist.   No podemos decirle cul ser su copago por los medicamentos por adelantado ya que esto es diferente dependiendo de la cobertura de su seguro. Sin embargo, es posible que podamos  encontrar un medicamento sustituto a audiological scientist un formulario para que el seguro cubra el medicamento que se considera necesario.   Si se requiere una autorizacin previa para que su compaa de seguros cubra su medicamento, por favor permtanos de 1 a 2 das hbiles para completar este proceso.  Los precios de los medicamentos varan con frecuencia dependiendo del environmental consultant de dnde se surte la receta y alguna farmacias pueden ofrecer precios ms baratos.  El sitio web www.goodrx.com tiene cupones para medicamentos de health and safety inspector. Los precios aqu no tienen en cuenta lo que podra costar con la ayuda del seguro (puede ser ms barato con su seguro), pero el sitio web puede darle el precio si no utiliz tourist information centre manager.  - Puede imprimir el cupn correspondiente y llevarlo con su receta a la farmacia.  - Tambin puede pasar por nuestra oficina durante el horario de atencin regular y education officer, museum una tarjeta  de cupones de GoodRx.  - Si necesita que su receta se enve electrnicamente a una farmacia diferente, informe a nuestra oficina a travs de MyChart de [place] o por telfono llamando al [phone] y presione la opcin 4.

## 2024-02-21 NOTE — Telephone Encounter (Signed)
 Lab, can we add ANA to existing blood work? Orders placed.

## 2024-02-22 DIAGNOSIS — E7800 Pure hypercholesterolemia, unspecified: Secondary | ICD-10-CM | POA: Diagnosis not present

## 2024-02-22 DIAGNOSIS — E1140 Type 2 diabetes mellitus with diabetic neuropathy, unspecified: Secondary | ICD-10-CM | POA: Diagnosis not present

## 2024-02-22 DIAGNOSIS — I509 Heart failure, unspecified: Secondary | ICD-10-CM | POA: Diagnosis not present

## 2024-02-22 DIAGNOSIS — J4520 Mild intermittent asthma, uncomplicated: Secondary | ICD-10-CM | POA: Diagnosis not present

## 2024-02-22 DIAGNOSIS — J4489 Other specified chronic obstructive pulmonary disease: Secondary | ICD-10-CM | POA: Diagnosis not present

## 2024-02-22 DIAGNOSIS — I110 Hypertensive heart disease with heart failure: Secondary | ICD-10-CM | POA: Diagnosis not present

## 2024-02-22 DIAGNOSIS — D649 Anemia, unspecified: Secondary | ICD-10-CM | POA: Diagnosis not present

## 2024-02-22 DIAGNOSIS — F419 Anxiety disorder, unspecified: Secondary | ICD-10-CM | POA: Diagnosis not present

## 2024-02-22 DIAGNOSIS — G4733 Obstructive sleep apnea (adult) (pediatric): Secondary | ICD-10-CM | POA: Diagnosis not present

## 2024-02-26 ENCOUNTER — Telehealth: Payer: Self-pay | Admitting: Primary Care

## 2024-02-26 NOTE — Telephone Encounter (Signed)
 Okay for verbal orders.

## 2024-02-26 NOTE — Telephone Encounter (Signed)
 Approved for nursing care with attention to wound on forearm.

## 2024-02-26 NOTE — Telephone Encounter (Signed)
 Can we also give verbal orders to the [facility] nurse to draw an ANA lab test?  Orders placed.

## 2024-02-26 NOTE — Telephone Encounter (Signed)
 Copied from CRM [id number]. Topic: Clinical - Home Health Verbal Orders >> Feb 26, 2024  1:56 PM Franky GRADE wrote: Caller/Agency: Michelle/ [facility] Callback Number: [phone] / secure voicemail  Service Requested: Skilled Nursing for wound care Frequency: 1 time a week  Any new concerns about the patient? Yes, open wound on her forearm.

## 2024-02-27 NOTE — Telephone Encounter (Unsigned)
 Copied from CRM #[id number]. Topic: General - Other >> Feb 27, 2024  8:46 AM Berneda FALCON wrote: Reason for CRM: Rosaline from [facility] calling to check on the verbal orders requested. Upon reading the notes, I did let her know that PCP approved the requested verbal orders for wound care. Additionally, Rosaline wanted to know if the orders were in the chart for the ANA the patient requested. I let her know this order was in her chart and appears to be placed on 12/4 and sent to [facility] (per the notes). Rosaline requested that we please send a copy of the orders to her fax at the number below.  [phone] Fax number-attention to [employer]

## 2024-02-27 NOTE — Telephone Encounter (Signed)
 Faxed 02/21/24 ANA order to [facility] at [phone]; attn:  Rosaline.

## 2024-02-28 NOTE — Telephone Encounter (Signed)
 [name], can you check on the below request? See my note from 02/26/24.

## 2024-02-29 NOTE — Telephone Encounter (Signed)
 Lab order was printed and faxed on 12/10/245 to [facility], Michelle's request (see 02/26/24 phn note).  Lvm asking Rosaline to call back. Need to find out if she received the faxed lab order above.

## 2024-03-03 NOTE — Telephone Encounter (Signed)
 Lift form was signed last week. Can we ensure this was faxed?

## 2024-03-05 NOTE — Telephone Encounter (Signed)
 Faxed order to [facility] at [phone].

## 2024-03-10 ENCOUNTER — Other Ambulatory Visit: Payer: Self-pay | Admitting: Primary Care

## 2024-03-10 DIAGNOSIS — F419 Anxiety disorder, unspecified: Secondary | ICD-10-CM

## 2024-03-10 DIAGNOSIS — R000 Tachycardia, unspecified: Secondary | ICD-10-CM

## 2024-03-10 DIAGNOSIS — M5116 Intervertebral disc disorders with radiculopathy, lumbar region: Secondary | ICD-10-CM

## 2024-03-10 DIAGNOSIS — I10 Essential (primary) hypertension: Secondary | ICD-10-CM

## 2024-04-01 ENCOUNTER — Telehealth: Payer: Self-pay

## 2024-04-01 NOTE — Telephone Encounter (Signed)
 I signed off on the hospital bed with trapeze last week. Can we check to make sure this was faxed.

## 2024-04-01 NOTE — Telephone Encounter (Signed)
 Copied from CRM [id number]. Topic: General - Other >> Apr 01, 2024 10:08 AM Nessti S wrote: Reason for CRM: vickie called because she faxed DME MEDICAL EQUIPMENT request 1/6. She will fax again and would like pcp to sign off on forms   ----------------------------------------------------------------------- From previous Reason for Contact - Med. Prior Auth: Reason for CRM:

## 2024-04-02 NOTE — Telephone Encounter (Signed)
 Checked pt's chart and I do not see anything scanned in. Checked [address] and the fax has not come through yet. Will await fax.

## 2024-04-07 ENCOUNTER — Other Ambulatory Visit: Payer: Self-pay | Admitting: Primary Care

## 2024-04-07 DIAGNOSIS — M62838 Other muscle spasm: Secondary | ICD-10-CM

## 2024-04-09 NOTE — Telephone Encounter (Signed)
 Lvm (per msg on vm, secured line) for Vickie of [facility] asking her to pls fax DME order again to [phone].

## 2024-04-09 NOTE — Telephone Encounter (Signed)
 Noted.

## 2024-04-09 NOTE — Telephone Encounter (Unsigned)
 Copied from CRM #[id number]. Topic: Clinical - Order For Equipment >> Apr 09, 2024  1:52 PM Mesmerise C wrote: Reason for CRM: [name] from [facility] states received a call regarding dme order states they already received signed DME order and sent it over to [employer] doesn't need anymore

## 2024-08-04 ENCOUNTER — Ambulatory Visit (INDEPENDENT_AMBULATORY_CARE_PROVIDER_SITE_OTHER): Admitting: Nurse Practitioner

## 2024-10-08 ENCOUNTER — Ambulatory Visit
